# Patient Record
Sex: Male | Born: 1950 | Race: White | Hispanic: No | Marital: Single | Smoking: Never smoker
Health system: Southern US, Community
[De-identification: ages and names within clinical notes are randomized; demographics above are authoritative.]

## PROBLEM LIST (undated history)

## (undated) DIAGNOSIS — I2510 Atherosclerotic heart disease of native coronary artery without angina pectoris: Secondary | ICD-10-CM

## (undated) DIAGNOSIS — I4891 Unspecified atrial fibrillation: Secondary | ICD-10-CM

## (undated) DIAGNOSIS — D693 Immune thrombocytopenic purpura: Secondary | ICD-10-CM

## (undated) DIAGNOSIS — F32A Depression, unspecified: Secondary | ICD-10-CM

## (undated) DIAGNOSIS — N186 End stage renal disease: Secondary | ICD-10-CM

## (undated) DIAGNOSIS — I10 Essential (primary) hypertension: Secondary | ICD-10-CM

## (undated) DIAGNOSIS — E785 Hyperlipidemia, unspecified: Secondary | ICD-10-CM

## (undated) HISTORY — PX: LIVER TRANSPLANT: SHX410

## (undated) HISTORY — PX: GALLBLADDER SURGERY: SHX652

## (undated) HISTORY — DX: Unspecified atrial fibrillation: I48.91

## (undated) HISTORY — DX: Immune thrombocytopenic purpura: D69.3

## (undated) HISTORY — DX: Essential (primary) hypertension: I10

## (undated) HISTORY — DX: End stage renal disease: N18.6

## (undated) HISTORY — DX: Hyperlipidemia, unspecified: E78.5

## (undated) HISTORY — PX: APPENDECTOMY: SHX54

## (undated) HISTORY — PX: KIDNEY TRANSPLANT: SHX239

## (undated) HISTORY — PX: FACIAL COSMETIC SURGERY: SHX629

## (undated) HISTORY — PX: CORONARY ARTERY BYPASS GRAFT: SHX141

## (undated) HISTORY — PX: HERNIA REPAIR: SHX51

## (undated) HISTORY — DX: Atherosclerotic heart disease of native coronary artery without angina pectoris: I25.10

---

## 1998-05-15 ENCOUNTER — Other Ambulatory Visit: Admission: RE | Admit: 1998-05-15 | Discharge: 1998-05-15 | Payer: Self-pay | Admitting: Nephrology

## 1998-10-16 DIAGNOSIS — D693 Immune thrombocytopenic purpura: Secondary | ICD-10-CM | POA: Insufficient documentation

## 2000-01-20 ENCOUNTER — Encounter: Payer: Self-pay | Admitting: Nephrology

## 2000-01-20 ENCOUNTER — Inpatient Hospital Stay (HOSPITAL_COMMUNITY): Admission: AD | Admit: 2000-01-20 | Discharge: 2000-01-26 | Payer: Self-pay | Admitting: Nephrology

## 2000-04-19 ENCOUNTER — Encounter (INDEPENDENT_AMBULATORY_CARE_PROVIDER_SITE_OTHER): Payer: Self-pay | Admitting: *Deleted

## 2000-04-19 ENCOUNTER — Ambulatory Visit (HOSPITAL_COMMUNITY): Admission: RE | Admit: 2000-04-19 | Discharge: 2000-04-19 | Payer: Self-pay | Admitting: *Deleted

## 2003-06-14 ENCOUNTER — Ambulatory Visit (HOSPITAL_COMMUNITY): Admission: RE | Admit: 2003-06-14 | Discharge: 2003-06-14 | Payer: Self-pay | Admitting: Gastroenterology

## 2003-06-14 ENCOUNTER — Encounter: Payer: Self-pay | Admitting: Gastroenterology

## 2006-02-03 ENCOUNTER — Ambulatory Visit (HOSPITAL_COMMUNITY): Admission: RE | Admit: 2006-02-03 | Discharge: 2006-02-03 | Payer: Self-pay | Admitting: Vascular Surgery

## 2006-10-23 ENCOUNTER — Inpatient Hospital Stay (HOSPITAL_COMMUNITY): Admission: EM | Admit: 2006-10-23 | Discharge: 2006-10-28 | Payer: Self-pay | Admitting: Emergency Medicine

## 2006-10-25 ENCOUNTER — Encounter (INDEPENDENT_AMBULATORY_CARE_PROVIDER_SITE_OTHER): Payer: Self-pay | Admitting: *Deleted

## 2006-10-25 ENCOUNTER — Encounter: Payer: Self-pay | Admitting: Vascular Surgery

## 2006-11-16 ENCOUNTER — Ambulatory Visit (HOSPITAL_COMMUNITY): Admission: RE | Admit: 2006-11-16 | Discharge: 2006-11-16 | Payer: Self-pay | Admitting: Vascular Surgery

## 2006-11-20 ENCOUNTER — Inpatient Hospital Stay (HOSPITAL_COMMUNITY): Admission: EM | Admit: 2006-11-20 | Discharge: 2006-11-24 | Payer: Self-pay | Admitting: Emergency Medicine

## 2006-12-06 ENCOUNTER — Inpatient Hospital Stay (HOSPITAL_COMMUNITY): Admission: AD | Admit: 2006-12-06 | Discharge: 2006-12-17 | Payer: Self-pay | Admitting: Nephrology

## 2006-12-13 ENCOUNTER — Ambulatory Visit: Payer: Self-pay | Admitting: Vascular Surgery

## 2007-02-25 ENCOUNTER — Inpatient Hospital Stay (HOSPITAL_COMMUNITY): Admission: EM | Admit: 2007-02-25 | Discharge: 2007-02-27 | Payer: Self-pay | Admitting: Nephrology

## 2007-07-14 IMAGING — CR DG CHEST 2V
2 series · 2 of 2 positions shown · non-contrast
Comparison: 10/23/06.

CLINICAL DATA: Fever and nausea and vomiting. 
 CHEST - 2 VIEW:

[view not recorded (1 of 2)]
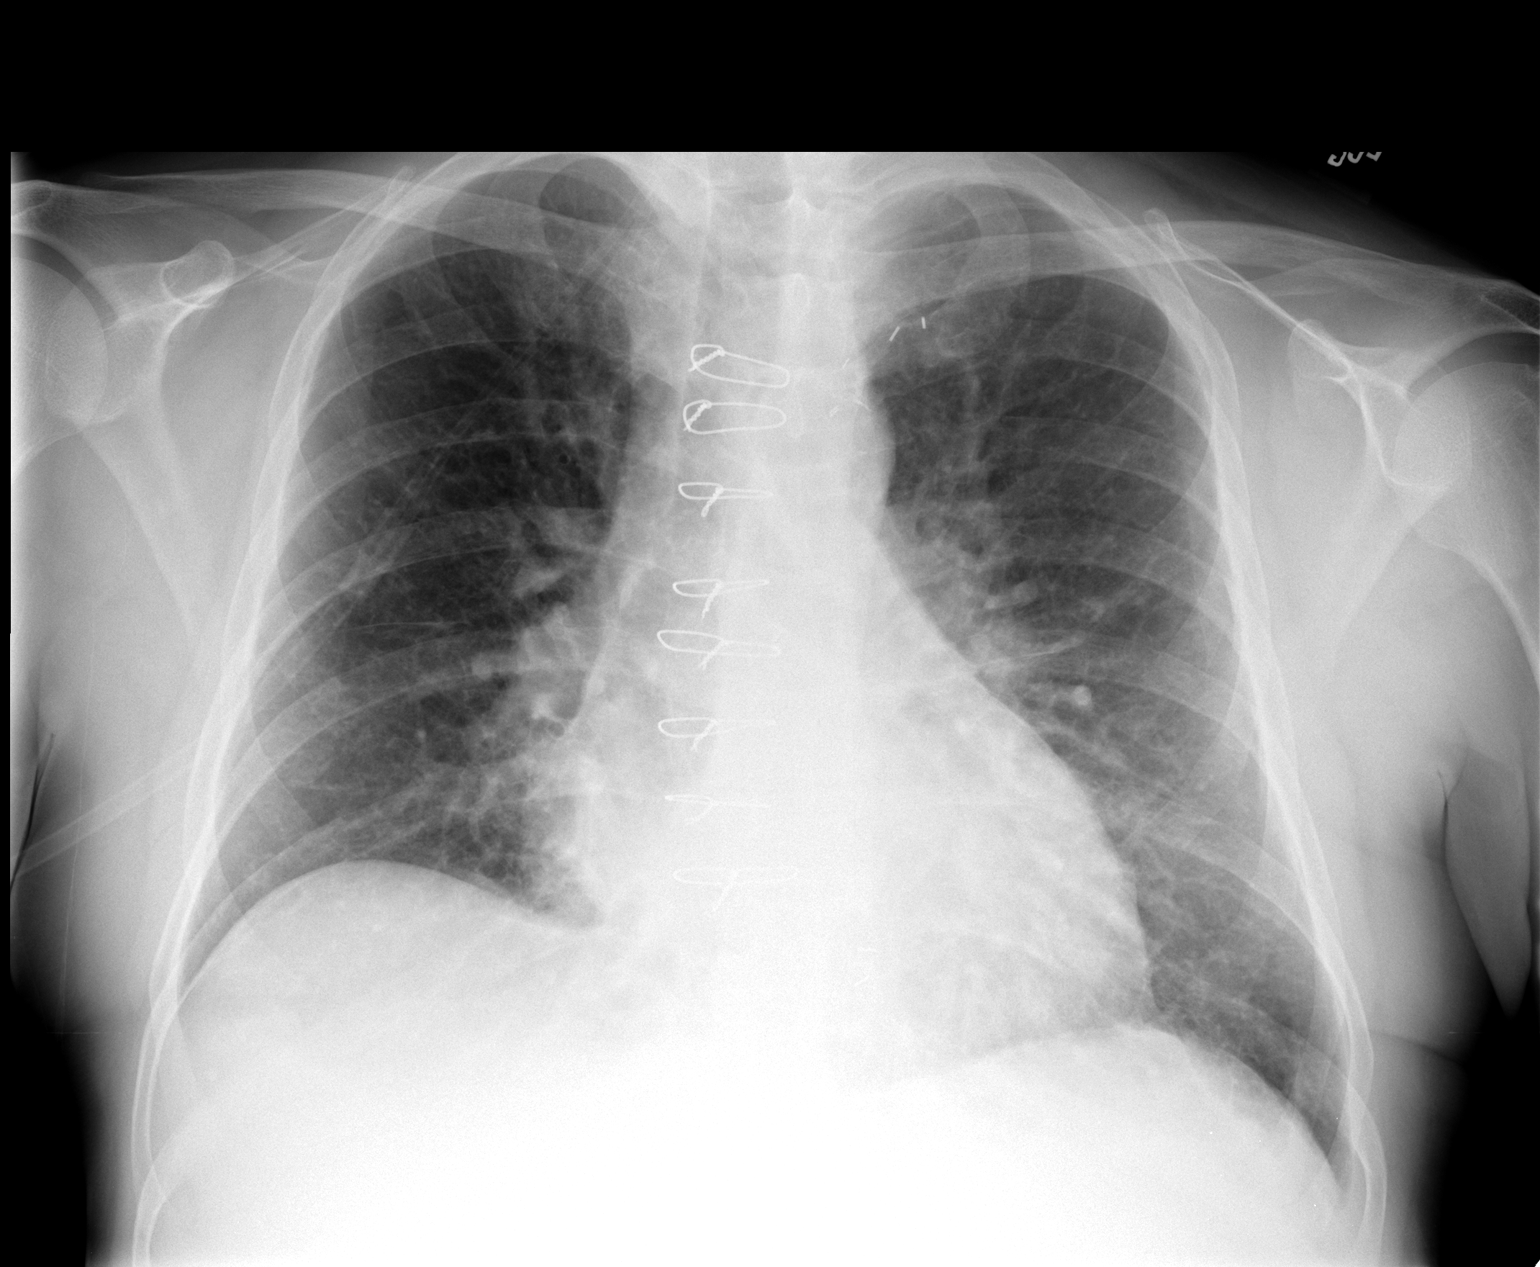

[view not recorded (2 of 2)]
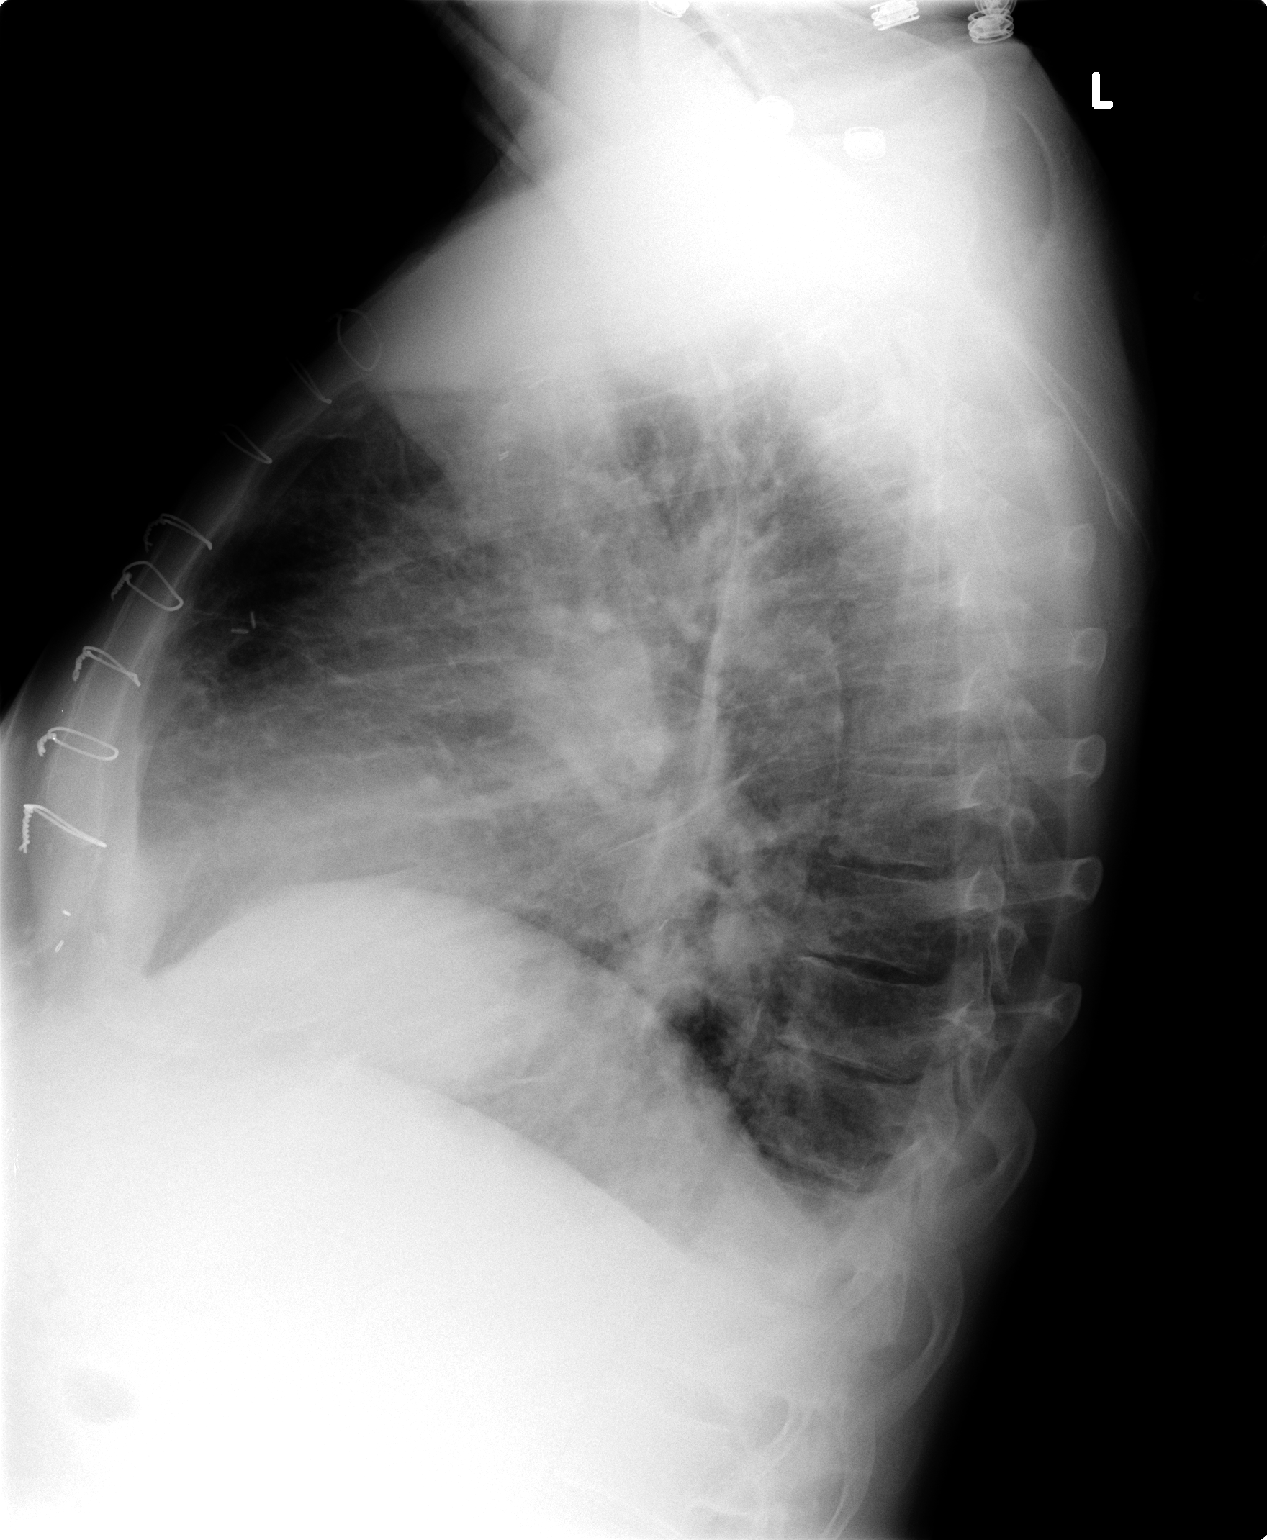

[2 of 2 positions shown; findings below may reference images not displayed]

FINDINGS: Patient is status post median sternotomy and coronary artery bypass grafting.  Heart size is minimally enlarged.  There is no effusions or edema.  Pulmonary vascular crowding is noted with low lung volumes.  There is no evidence for pneumonia. 
 Interstitial changes consistent with COPD/emphysema.
IMPRESSION: 1.  No active cardiopulmonary disease. 
 2.  COPD/emphysema.

## 2009-08-08 DIAGNOSIS — I4891 Unspecified atrial fibrillation: Secondary | ICD-10-CM

## 2009-08-08 HISTORY — DX: Unspecified atrial fibrillation: I48.91

## 2009-09-18 ENCOUNTER — Ambulatory Visit: Payer: Self-pay | Admitting: Cardiovascular Disease

## 2009-10-16 ENCOUNTER — Ambulatory Visit: Payer: Self-pay | Admitting: Cardiovascular Disease

## 2009-10-24 ENCOUNTER — Ambulatory Visit: Payer: Self-pay | Admitting: Cardiology

## 2009-10-24 ENCOUNTER — Ambulatory Visit (HOSPITAL_COMMUNITY): Admission: RE | Admit: 2009-10-24 | Discharge: 2009-10-24 | Payer: Self-pay | Admitting: Cardiology

## 2009-11-25 ENCOUNTER — Ambulatory Visit: Payer: Self-pay | Admitting: Cardiovascular Disease

## 2009-12-11 ENCOUNTER — Ambulatory Visit: Payer: Self-pay | Admitting: Cardiovascular Disease

## 2009-12-26 ENCOUNTER — Telehealth: Payer: Self-pay | Admitting: Cardiovascular Disease

## 2010-01-27 ENCOUNTER — Ambulatory Visit: Payer: Self-pay | Admitting: Cardiovascular Disease

## 2010-03-11 ENCOUNTER — Ambulatory Visit: Payer: Self-pay | Admitting: Cardiovascular Disease

## 2010-04-07 ENCOUNTER — Telehealth: Payer: Self-pay | Admitting: Cardiovascular Disease

## 2010-07-14 ENCOUNTER — Ambulatory Visit: Payer: Self-pay | Admitting: Cardiovascular Disease

## 2010-07-17 ENCOUNTER — Telehealth: Payer: Self-pay | Admitting: Cardiovascular Disease

## 2010-07-17 ENCOUNTER — Telehealth (INDEPENDENT_AMBULATORY_CARE_PROVIDER_SITE_OTHER): Payer: Self-pay | Admitting: Pharmacist

## 2010-07-17 DIAGNOSIS — D472 Monoclonal gammopathy: Secondary | ICD-10-CM | POA: Insufficient documentation

## 2010-08-12 ENCOUNTER — Ambulatory Visit: Payer: Self-pay | Admitting: Cardiovascular Disease

## 2010-08-12 LAB — CONVERTED CEMR LAB: POC INR: 2.4

## 2010-09-02 ENCOUNTER — Ambulatory Visit: Payer: Self-pay | Admitting: Internal Medicine

## 2010-09-09 ENCOUNTER — Ambulatory Visit: Payer: Self-pay | Admitting: Cardiology

## 2010-09-09 LAB — CONVERTED CEMR LAB: POC INR: 1.6

## 2010-09-16 ENCOUNTER — Ambulatory Visit: Payer: Self-pay | Admitting: Cardiovascular Disease

## 2010-09-16 LAB — CONVERTED CEMR LAB: POC INR: 2.2

## 2010-10-07 ENCOUNTER — Ambulatory Visit: Payer: Self-pay | Admitting: Internal Medicine

## 2010-10-07 LAB — CONVERTED CEMR LAB: POC INR: 1.5

## 2010-10-21 ENCOUNTER — Ambulatory Visit: Payer: Self-pay | Admitting: Cardiology

## 2010-10-21 LAB — CONVERTED CEMR LAB: POC INR: 1.9

## 2010-11-11 ENCOUNTER — Ambulatory Visit: Admission: RE | Admit: 2010-11-11 | Discharge: 2010-11-11 | Payer: Self-pay | Source: Home / Self Care

## 2010-11-11 LAB — CONVERTED CEMR LAB: POC INR: 1.8

## 2010-11-16 ENCOUNTER — Telehealth: Payer: Self-pay | Admitting: Cardiovascular Disease

## 2010-11-24 ENCOUNTER — Ambulatory Visit
Admission: RE | Admit: 2010-11-24 | Discharge: 2010-11-24 | Payer: Self-pay | Source: Home / Self Care | Attending: Cardiovascular Disease | Admitting: Cardiovascular Disease

## 2010-11-25 ENCOUNTER — Ambulatory Visit: Admission: RE | Admit: 2010-11-25 | Discharge: 2010-11-25 | Payer: Self-pay | Source: Home / Self Care

## 2010-12-10 NOTE — Medication Information (Signed)
Summary: rov/tm  Anticoagulant Therapy  Managed by: Tula Nakayama, RN, BSN Referring MD: Fletcher Anon MD, Saintclair Halsted MD: Lovena Le MD, Carleene Overlie Indication 1: Atrial Fibrillation Lab Used: [facility] [place] Site: [place] INR POC 1.4 INR RANGE 2.0-3.0  Dietary changes: no    Health status changes: yes       Details: Went to ER, had abdominal pain and running a fever  Bleeding/hemorrhagic complications: no    Recent/future hospitalizations: no    Any changes in medication regimen? no    Recent/future dental: no  Any missed doses?: yes     Details: Missed last Friday and Saturday's doses  Is patient compliant with meds? yes       Anticoagulation Management History:      The patient is taking warfarin and comes in today for a routine follow up visit.  Negative risk factors for bleeding include an age less than 55 years old.  The bleeding index is 'low risk'.  Negative CHADS2 values include Age > 50 years old.  Anticoagulation responsible [name]: Lovena Le MD, Carleene Overlie.  INR POC: 1.4.  Cuvette Lot#: [id number].  Exp: 09/2011.    Anticoagulation Management Assessment/Plan:      The target INR is 2.0-3.0.  The next INR is due 09/09/2010.  Anticoagulation instructions were given to patient.  Results were reviewed/authorized by Tula Nakayama, RN, BSN.  He was notified by [name] candidate.         Prior Anticoagulation Instructions: INR 2.4 Change dose to 5mg s everyday except 2.5mg s on Mondays, Wednesdays and Fridays. Recheck in 3 weeks.   Current Anticoagulation Instructions: INR 1.4  Take an extra tablet today and tomorrow, then continue taking same dose of 1 tablet everyday except 1/2 tablet on Monday, Wednesday, and Friday. Recheck in 7-10 days.   Appended Document: Coumadin Clinic    Anticoagulant Therapy  Managed by: Tula Nakayama, RN, BSN Referring MD: Fletcher Anon MD, Saintclair Halsted MD: Lovena Le MD, Carleene Overlie Indication 1: Atrial Fibrillation Lab  Used: [facility] [place] Site: [place] INR POC 1.4 INR RANGE 2.0-3.0           Anticoagulation Management History:      The patient is taking warfarin and comes in today for a routine follow up visit.  Negative risk factors for bleeding include an age less than 22 years old.  The bleeding index is 'low risk'.  Negative CHADS2 values include Age > 17 years old.  Anticoagulation responsible [name]: Lovena Le MD, Carleene Overlie.  INR POC: 1.4.  Cuvette Lot#: [id number].  Exp: 09/2011.    Anticoagulation Management Assessment/Plan:      The target INR is 2.0-3.0.  The next INR is due 09/09/2010.  Anticoagulation instructions were given to patient.  Results were reviewed/authorized by Tula Nakayama, RN, BSN.  He was notified by Tula Nakayama, RN, BSN.         Prior Anticoagulation Instructions: INR 1.4  Take an extra tablet today and tomorrow, then continue taking same dose of 1 tablet everyday except 1/2 tablet on Monday, Wednesday, and Friday. Recheck in 7-10 days.   Current Anticoagulation Instructions: INR 1.4  Today take 5mg s and tomorrow take 7.5mg s then resume 5mg s daily excpet 2.5mg s on MWF. Recheck in 7-10 days.

## 2010-12-10 NOTE — Procedures (Signed)
Summary: summary report  summary report   Imported By: Parks Neptune 12/16/2009 15:43:36  _____________________________________________________________________  External Attachment:    Type:   Image     Comment:   External Document

## 2010-12-10 NOTE — Medication Information (Signed)
Summary: [name]  Anticoagulant Therapy  Managed by: Tula Nakayama, RN, BSN Referring MD: Fletcher Anon MD, Saintclair Halsted MD: Percival Spanish MD, Jeneen Rinks Indication 1: Atrial Fibrillation Lab [facility] [place] Site: [place] INR POC 1.8 INR RANGE 2.0-3.0  Dietary changes: no    Health status changes: no    Bleeding/hemorrhagic complications: no    Recent/future hospitalizations: yes       Details: Last week in hosp. for Fever of unorigin and low platelet  Any changes in medication regimen? yes       Details: ASA discontinued   Recent/future dental: no  Any missed doses?: no       Is patient compliant with meds? yes       Anticoagulation Management History:      The patient is taking warfarin and comes in today for a routine follow up visit.  Negative risk factors for bleeding include an age less than 47 years old.  The bleeding index is 'low risk'.  Negative CHADS2 values include Age > 41 years old.  Anticoagulation responsible provider: Percival Spanish MD, Jeneen Rinks.  INR POC: 1.8.  Cuvette Lot#: [id number].  Exp: 12/2011.    Anticoagulation Management Assessment/Plan:      The target INR is 2.0-3.0.  The next INR is due 11/25/2010.  Anticoagulation instructions were given to patient.  Results were reviewed/authorized by Tula Nakayama, RN, BSN.  He was notified by Tula Nakayama, RN, BSN.         Prior Anticoagulation Instructions: INR 1.9   Take 1 tablet today. Then take 1 tablet everday except 1/2 tablet on Mondays and Fridays.  Recheck INR in 3 weeks   Current Anticoagulation Instructions: INR 1.8 Today take 1.5 pills then change dose to 1 pill everyday except 1/2 pill on Mondays. Recheck in 2 weeks.

## 2010-12-10 NOTE — Medication Information (Signed)
Summary: rov/kh  Anticoagulant Therapy  Managed by: Clement Husbands.D Referring MD: Fletcher Anon MD, Saintclair Halsted MD: Aundra Dubin MD, Dalton Indication 1: Atrial Fibrillation Lab [facility] [place] Site: [place] INR POC 1.9 INR RANGE 2.0-3.0  Dietary changes: no    Health status changes: no    Bleeding/hemorrhagic complications: no    Recent/future hospitalizations: no    Any changes in medication regimen? no    Recent/future dental: no  Any missed doses?: no       Is patient compliant with meds? yes       Anticoagulation Management History:      The patient is taking warfarin and comes in today for a routine follow up visit.  Negative risk factors for bleeding include an age less than 77 years old.  The bleeding index is 'low risk'.  Negative CHADS2 values include Age > 32 years old.  Anticoagulation responsible Liliah Dorian: Aundra Dubin MD, Dalton.  INR POC: 1.9.  Cuvette Lot#: [id number].  Exp: 09/2011.    Anticoagulation Management Assessment/Plan:      The target INR is 2.0-3.0.  The next INR is due 11/11/2010.  Anticoagulation instructions were given to patient.  Results were reviewed/authorized by Hazard, Jacquelyne Balint.D.         Prior Anticoagulation Instructions: INR 1.5 Take 1.5 tablet today then take 1 tablet everyday except 0.5 tablet on Monday and Friday Recheck INR in   Current Anticoagulation Instructions: INR 1.9   Take 1 tablet today. Then take 1 tablet everday except 1/2 tablet on Mondays and Fridays.  Recheck INR in 3 weeks

## 2010-12-10 NOTE — Progress Notes (Signed)
  Phone Note Outgoing Call   Call placed by: Rhett Bannister LPN Call placed to: Patient Summary of Call: [facility] notifying patient per Dr. Zenia Resides that heartrate on Holter was OK.  If any questions, advised to call office.

## 2010-12-10 NOTE — Progress Notes (Signed)
  Phone Note Outgoing Call   Call placed by: Rhett Bannister  LPN,  September  9, [id number] 4:21 PM Call placed to: coumadin clinic Summary of Call: [facility]. Coumadin clinic at [facility]. in [place] wants to start having INR checks there per Dr. Harrell Lark.  They will call patient at home to see when he is due a check and then set him up with an appt. there.

## 2010-12-10 NOTE — Medication Information (Signed)
Summary: rov/sl  Anticoagulant Therapy  Managed by: Porfirio Oar, PharmD Referring MD: Fletcher Anon MD, Saintclair Halsted MD: Haroldine Laws MD, Quillian Quince Indication 1: Atrial Fibrillation Lab Used: [facility] [place] Site: [place] INR POC 1.5 INR RANGE 2.0-3.0  Dietary changes: no    Health status changes: no    Bleeding/hemorrhagic complications: no    Recent/future hospitalizations: no    Any changes in medication regimen? no    Recent/future dental: no  Any missed doses?: no       Is patient compliant with meds? yes       Anticoagulation Management History:      The patient is taking warfarin and comes in today for a routine follow up visit.  Negative risk factors for bleeding include an age less than 46 years old.  The bleeding index is 'low risk'.  Negative CHADS2 values include Age > 67 years old.  Anticoagulation responsible provider: Bensimhon MD, Quillian Quince.  INR POC: 1.5.  Exp: 09/2011.    Anticoagulation Management Assessment/Plan:      The target INR is 2.0-3.0.  The next INR is due 10/21/2010.  Anticoagulation instructions were given to patient.  Results were reviewed/authorized by Porfirio Oar, PharmD.  He was notified by Carmin Richmond, PharmD Candidate.         Prior Anticoagulation Instructions: INR 2.2  Take Coumadin 1 tab (5 mg) on Sun, Wed, Thur and Coumadin 0.5 tab (2.5 mg) on Mon, Tues, Fri, Sat. Return to clinic in 3 weeks.   Current Anticoagulation Instructions: INR 1.5 Take 1.5 tablet today then take 1 tablet everyday except 0.5 tablet on Monday and Friday Recheck INR in

## 2010-12-10 NOTE — Medication Information (Signed)
Summary: rov/nb  Anticoagulant Therapy  Managed by: Gypsy Lore, PharmD Referring MD: Fletcher Anon MD, Saintclair Halsted MD: Johnsie Cancel MD, Collier Salina Indication 1: Atrial Fibrillation Lab Used: [facility] [place] Site: [place] INR POC 2.2 INR RANGE 2.0-3.0  Dietary changes: no    Health status changes: no    Bleeding/hemorrhagic complications: no    Recent/future hospitalizations: no    Any changes in medication regimen? no    Recent/future dental: no  Any missed doses?: no       Is patient compliant with meds? yes      Comments: Pt reports that he has been taking Coumadin 2.5 mg (0.5 tab) on Fri, Sat, Sun, Mon which is inconsistent with our instructions since he has been seen in our clinic. I have changed his dosing to reflect what he actually reports doing.   Anticoagulation Management History:      The patient is taking warfarin and comes in today for a routine follow up visit.  Negative risk factors for bleeding include an age less than 19 years old.  The bleeding index is 'low risk'.  Negative CHADS2 values include Age > 55 years old.  Anticoagulation responsible provider: Johnsie Cancel MD, Collier Salina.  INR POC: 2.2.  Cuvette Lot#: [id number].  Exp: 09/2011.    Anticoagulation Management Assessment/Plan:      The target INR is 2.0-3.0.  The next INR is due 10/07/2010.  Anticoagulation instructions were given to patient.  Results were reviewed/authorized by Gypsy Lore, PharmD.  He was notified by Gypsy Lore PharmD.         Prior Anticoagulation Instructions: INR 1.6  Take 1 whole tablet today then take 1 tablet everyday except 1/2 tablet on Monday and Friday.  Recheck INR in 1week.  Current Anticoagulation Instructions: INR 2.2  Take Coumadin 1 tab (5 mg) on Sun, Wed, Thur and Coumadin 0.5 tab (2.5 mg) on Mon, Tues, Fri, Sat. Return to clinic in 3 weeks.

## 2010-12-10 NOTE — Medication Information (Signed)
Summary: new patient/eac  Anticoagulant Therapy  Managed by: Tula Nakayama, RN, BSN Referring MD: Fletcher Anon MD, Saintclair Halsted MD: Angelena Form MD, Harrell Gave Indication 1: Atrial Fibrillation Lab [facility] [place] Site: [place] INR POC 2.4 INR RANGE 2.0-3.0  Dietary changes: no    Health status changes: no    Bleeding/hemorrhagic complications: no    Recent/future hospitalizations: no    Any changes in medication regimen? no    Recent/future dental: no  Any missed doses?: no       Is patient compliant with meds? yes      Comments: Pt has been altering 5mg s with 2.5mg s he states for the past year. He states INR's are usually in range(2.0-3.0) He states he was placed on coumadin by [facility]. a year ago.  New pt education reviewed, bleeding, dietary, OTC's, pending procedure protocol.   Anticoagulation Management History:      The patient comes in today for his initial visit for anticoagulation therapy.  Negative risk factors for bleeding include an age less than 58 years old.  The bleeding index is 'low risk'.  Negative CHADS2 values include Age > 58 years old.  Anticoagulation responsible provider: Angelena Form MD, Harrell Gave.  INR POC: 2.4.  Cuvette Lot#: [id number].  Exp: 09/2011.    Anticoagulation Management Assessment/Plan:      The target INR is 2.0-3.0.  The next INR is due 09/02/2010.  Anticoagulation instructions were given to patient.  Results were reviewed/authorized by Tula Nakayama, RN, BSN.  He was notified by Tula Nakayama, RN, BSN.         Current Anticoagulation Instructions: INR 2.4 Change dose to 5mg s everyday except 2.5mg s on Mondays, Wednesdays and Fridays. Recheck in 3 weeks.

## 2010-12-10 NOTE — Progress Notes (Signed)
  Phone Note Outgoing Call   Call placed by: Rhett Bannister,  Apr 07, 2010 2:16 PM Call placed to: Patient Summary of Call: Patient notified per Dr. Zenia Resides, can monitor INRs temporarily but should use [address] or local PCP for long term.

## 2010-12-10 NOTE — Progress Notes (Signed)
  Phone Note From Other Clinic   Caller: [place] [facility] [place] Call For: Coumadin Clinic Summary of Call: Dr. Tyrell Antonio RN at [facility] office called to schedule an appt for a patient with the coumadin clinic.  [name] is currently on coumadin for atrial fib, and was being managed by his PCP, Dr. Harrell Lark, in [facility].  He feels it would be more convenient to come to the [place] office and would like the [place] to manage his coumadin.  I have contacted the patient to set up an appt.  He was just seen this past wednesday for INR check by Dr. Harrell Lark and was told to follow-up in 1 month.  I have scheduled him for 4 weeks from his most recent appt with Dr. Verdie Drown office.   Initial call taken by: Margaretha Sheffield, PharmD 07/17/10, 4:30 pm

## 2010-12-10 NOTE — Progress Notes (Signed)
  Phone Note Outgoing Call   Call placed by: Rhett Bannister  LPN,  January  9, [id number] 3:10 PM Call placed to: Patient Summary of Call: LMVM-patient to call office and schedulle appt. for f/u sp Inpatient.

## 2010-12-10 NOTE — Procedures (Signed)
Summary: SUMMARY REPORT  SUMMARY REPORT   Imported By: Parks Neptune 12/29/2009 14:43:17  _____________________________________________________________________  External Attachment:    Type:   Image     Comment:   External Document

## 2010-12-10 NOTE — Progress Notes (Signed)
Summary: ORDER FOR PT  Phone Note Call from Patient   Summary of Call: PT CALLED AND STATED DR SPRY WANTED HIM TO HAVE HIS PROTIME CHECKED [place].  HE IS WANTING TO GO TO [facility] [address] [facility].  WILL DR. Fletcher Anon SET THESE UP FOR HIM?   [phone] Initial call taken by: Luana Shu,  July 17, 2010 3:31 PM

## 2010-12-10 NOTE — Medication Information (Signed)
Summary: rov/tm  Anticoagulant Therapy  Managed by: Tula Nakayama, RN, BSN Referring MD: Fletcher Anon MD, Saintclair Halsted MD: [name] MD, Remo Lipps Indication 1: Atrial Fibrillation Lab [facility] [place] Site: [place] INR POC 2.4 INR RANGE 2.0-3.0  Dietary changes: no    Health status changes: no    Bleeding/hemorrhagic complications: no    Recent/future hospitalizations: yes       Details: Abnormal fevers  Any changes in medication regimen? no    Recent/future dental: no  Any missed doses?: no       Is patient compliant with meds? yes       Anticoagulation Management History:      The patient is taking warfarin and comes in today for a routine follow up visit.  Negative risk factors for bleeding include an age less than 54 years old.  The bleeding index is 'low risk'.  Negative CHADS2 values include Age > 53 years old.  Anticoagulation responsible provider: [name] MD, Remo Lipps.  INR POC: 2.4.  Cuvette Lot#: [id number].  Exp: 12/2011.    Anticoagulation Management Assessment/Plan:      The target INR is 2.0-3.0.  The next INR is due 12/24/2010.  Anticoagulation instructions were given to patient.  Results were reviewed/authorized by Tula Nakayama, RN, BSN.  He was notified by Ernst Bowler, PharmD candidate.         Prior Anticoagulation Instructions: INR 1.8 Today take 1.5 pills then change dose to 1 pill everyday except 1/2 pill on Mondays. Recheck in 2 weeks.   Current Anticoagulation Instructions: INR 2.4 (INR goal: 2-3)  Take 1 tablet everyday except 1/2 tablet on Mondays.

## 2010-12-10 NOTE — Medication Information (Signed)
Summary: rov/sel  Anticoagulant Therapy  Managed by: Porfirio Oar, PharmD Referring MD: Fletcher Anon MD, Saintclair Halsted MD: Lia Foyer MD, Marcello Moores Indication 1: Atrial Fibrillation Lab Used: [facility] [place] Site: [place] INR POC 1.6 INR RANGE 2.0-3.0  Dietary changes: no    Health status changes: no    Bleeding/hemorrhagic complications: no    Recent/future hospitalizations: no    Any changes in medication regimen? no    Recent/future dental: no  Any missed doses?: yes     Details: Missed 1 dose on Sunday night.  Is patient compliant with meds? yes       Anticoagulation Management History:      The patient is taking warfarin and comes in today for a routine follow up visit.  Negative risk factors for bleeding include an age less than 34 years old.  The bleeding index is 'low risk'.  Negative CHADS2 values include Age > 25 years old.  Anticoagulation responsible provider: Lia Foyer MD, Marcello Moores.  INR POC: 1.6.  Cuvette Lot#: [id number].  Exp: 09/2011.    Anticoagulation Management Assessment/Plan:      The target INR is 2.0-3.0.  The next INR is due 09/16/2010.  Anticoagulation instructions were given to patient.  Results were reviewed/authorized by Porfirio Oar, PharmD.  He was notified by Carmin Richmond, PharmD Candidate.         Prior Anticoagulation Instructions: INR 1.4  Today take 5mg s and tomorrow take 7.5mg s then resume 5mg s daily excpet 2.5mg s on MWF. Recheck in 7-10 days.   Current Anticoagulation Instructions: INR 1.6  Take 1 whole tablet today then take 1 tablet everyday except 1/2 tablet on Monday and Friday.  Recheck INR in 1week.

## 2010-12-14 ENCOUNTER — Encounter: Payer: Self-pay | Admitting: Internal Medicine

## 2010-12-23 ENCOUNTER — Telehealth: Payer: Self-pay | Admitting: Cardiovascular Disease

## 2010-12-30 ENCOUNTER — Encounter (INDEPENDENT_AMBULATORY_CARE_PROVIDER_SITE_OTHER): Payer: Self-pay | Admitting: Pharmacist

## 2010-12-31 ENCOUNTER — Encounter: Payer: Self-pay | Admitting: Internal Medicine

## 2011-01-05 NOTE — Medication Information (Addendum)
Summary: Coumadin Clinic  Anticoagulant Therapy  Managed by: Tula Nakayama, RN, BSN Referring MD: Fletcher Anon MD, Saintclair Halsted MD: Lovena Le MD, Carleene Overlie Indication 1: Atrial Fibrillation Lab Used: [facility] [place] Site: [place] INR POC 1.3 INR RANGE 2.0-3.0  Dietary changes: no    Health status changes: no    Bleeding/hemorrhagic complications: no    Recent/future hospitalizations: no    Any changes in medication regimen? no    Recent/future dental: no  Any missed doses?: no       Is patient compliant with meds? yes      Comments: Pt states he was in hospital on  2/5 at [place] for 1/2  day then transfered to [facility] discharged from [facility] on 2/14. INR on 2/5 was 5.0.  He was discharged home taking 2.5mg s daily. INR on 2/14 was 3.0  Anticoagulation Management History:      His anticoagulation is being managed by telephone today.  Negative risk factors for bleeding include an age less than 57 years old.  The bleeding index is 'low risk'.  Negative CHADS2 values include Age > 34 years old.  Anticoagulation responsible provider: Lovena Le MD, Carleene Overlie.  INR POC: 1.3.    Anticoagulation Management Assessment/Plan:      The target INR is 2.0-3.0.  The next INR is due 01/07/2011.  Anticoagulation instructions were given to patient.  Results were reviewed/authorized by Tula Nakayama, RN, BSN.  He was notified by Tula Nakayama, RN, BSN.         Prior Anticoagulation Instructions: INR 2.4 (INR goal: 2-3)  Take 1 tablet everyday except 1/2 tablet on Mondays.    Current Anticoagulation Instructions: INR 1.3 Change dose to 1 pill everyday except 1/2 pill on Mondays, Wednesdays and Fridays. Recheck in one week.

## 2011-01-05 NOTE — Progress Notes (Signed)
Summary: [place] FOR PT  Phone Note Call from Patient   Summary of Call: PT CALLED AND WANTS DR. Fletcher Anon TO SET HIM UP [place] FOR HIS PT'S CKS SO HE WILL NOT HAVE TO GO TO [place].  [place] TALKED TO HIM ABOUT THIS AT HIS LAST VISIT. Initial call taken by: Luana Shu,  December 23, 2010 3:57 PM    Called patient back. He will get more information about how to order and more details of how it works.

## 2011-01-06 ENCOUNTER — Encounter: Payer: Self-pay | Admitting: Cardiology

## 2011-01-06 DIAGNOSIS — I4891 Unspecified atrial fibrillation: Secondary | ICD-10-CM

## 2011-01-07 ENCOUNTER — Encounter (INDEPENDENT_AMBULATORY_CARE_PROVIDER_SITE_OTHER): Payer: Medicare Other

## 2011-01-07 ENCOUNTER — Encounter: Payer: Self-pay | Admitting: Internal Medicine

## 2011-01-07 DIAGNOSIS — I4891 Unspecified atrial fibrillation: Secondary | ICD-10-CM

## 2011-01-07 DIAGNOSIS — Z7901 Long term (current) use of anticoagulants: Secondary | ICD-10-CM

## 2011-01-14 ENCOUNTER — Encounter: Payer: Self-pay | Admitting: Internal Medicine

## 2011-01-14 ENCOUNTER — Encounter (INDEPENDENT_AMBULATORY_CARE_PROVIDER_SITE_OTHER): Payer: Medicare Other

## 2011-01-14 DIAGNOSIS — I4891 Unspecified atrial fibrillation: Secondary | ICD-10-CM

## 2011-01-14 DIAGNOSIS — Z7901 Long term (current) use of anticoagulants: Secondary | ICD-10-CM

## 2011-01-14 NOTE — Medication Information (Signed)
Summary: rov/tm  Anticoagulant Therapy  Managed by: Freddrick March, RN, BSN Referring MD: Fletcher Anon MD, Saintclair Halsted MD: Rayann Heman MD, Jeneen Rinks Indication 1: Atrial Fibrillation Lab [facility] [place] Site: [place] INR POC 1.3 INR RANGE 2.0-3.0  Dietary changes: no    Health status changes: no    Bleeding/hemorrhagic complications: no    Recent/future hospitalizations: no    Any changes in medication regimen? no    Recent/future dental: no  Any missed doses?: no       Is patient compliant with meds? yes       Anticoagulation Management History:      The patient is taking warfarin and comes in today for a routine follow up visit.  Negative risk factors for bleeding include an age less than 54 years old.  The bleeding index is 'low risk'.  Negative CHADS2 values include Age > 63 years old.  Anticoagulation responsible provider: Jas Betten MD, Jeneen Rinks.  INR POC: 1.3.  Cuvette Lot#: [id number].  Exp: 11/2011.    Anticoagulation Management Assessment/Plan:      The target INR is 2.0-3.0.  The next INR is due 01/14/2011.  Anticoagulation instructions were given to patient.  Results were reviewed/authorized by Freddrick March, RN, BSN.  He was notified by Freddrick March, RN, BSN.         Prior Anticoagulation Instructions: INR 1.3 Change dose to 1 pill everyday except 1/2 pill on Mondays, Wednesdays and Fridays. Recheck in one week.   Current Anticoagulation Instructions: INR 1.3  Start taking 1 tablet daily.  Recheck in 1 week.

## 2011-01-19 NOTE — Medication Information (Addendum)
Summary: rov/ewj  Anticoagulant Therapy  Managed by: Danella Penton, RN Referring MD: Fletcher Anon MD, Saintclair Halsted MD: Lovena Le MD, Carleene Overlie Indication 1: Atrial Fibrillation Lab Used: [facility] [place] Site: [place] INR POC 1.5 INR RANGE 2.0-3.0  Dietary changes: no    Health status changes: no    Bleeding/hemorrhagic complications: no    Recent/future hospitalizations: no    Any changes in medication regimen? no    Recent/future dental: no  Any missed doses?: no       Is patient compliant with meds? yes       Anticoagulation Management History:      The patient is taking warfarin and comes in today for a routine follow up visit.  Negative risk factors for bleeding include an age less than 88 years old.  The bleeding index is 'low risk'.  Negative CHADS2 values include Age > 64 years old.  Anticoagulation responsible provider: Lovena Le MD, Carleene Overlie.  INR POC: 1.5.  Cuvette Lot#: [id number].  Exp: 11/2011.    Anticoagulation Management Assessment/Plan:      The target INR is 2.0-3.0.  The next INR is due 01/25/2011.  Anticoagulation instructions were given to patient.  Results were reviewed/authorized by Danella Penton, RN.  He was notified by Danella Penton, RN.         Prior Anticoagulation Instructions: INR 1.3  Start taking 1 tablet daily.  Recheck in 1 week.    Current Anticoagulation Instructions: INR 1.5 Take 1 1/2 tablets on Thursdays and Fridays.  Then, begin taking 1 tablet every day, except take 1 1/2 on Mondays. Recheck in 10 days.

## 2011-01-19 NOTE — Letter (Signed)
Summary: [facility]: Discharge Summary  [facility]: Discharge Summary   Imported By: Roddie Mc 01/06/2011 09:53:07  _____________________________________________________________________  External Attachment:    Type:   Image     Comment:   External Document

## 2011-01-25 ENCOUNTER — Encounter (INDEPENDENT_AMBULATORY_CARE_PROVIDER_SITE_OTHER): Payer: Medicare Other

## 2011-01-25 ENCOUNTER — Encounter: Payer: Self-pay | Admitting: Internal Medicine

## 2011-01-25 DIAGNOSIS — I4891 Unspecified atrial fibrillation: Secondary | ICD-10-CM

## 2011-01-25 DIAGNOSIS — Z7901 Long term (current) use of anticoagulants: Secondary | ICD-10-CM

## 2011-01-25 LAB — CONVERTED CEMR LAB: POC INR: 2

## 2011-02-01 LAB — POCT INR: INR: 3.7

## 2011-02-03 ENCOUNTER — Ambulatory Visit (INDEPENDENT_AMBULATORY_CARE_PROVIDER_SITE_OTHER): Payer: Medicare Other | Admitting: Cardiology

## 2011-02-03 DIAGNOSIS — Z7901 Long term (current) use of anticoagulants: Secondary | ICD-10-CM

## 2011-02-03 DIAGNOSIS — I4891 Unspecified atrial fibrillation: Secondary | ICD-10-CM

## 2011-02-03 NOTE — Patient Instructions (Signed)
INR 3.7  Spoke with pt.  Skip today's dose then reduce dose to 1 tablet every day.  Recheck INR in 1 week.

## 2011-02-04 NOTE — Medication Information (Signed)
Summary: rov/pc  Anticoagulant Therapy  Managed by: Ella Jubilee, PharmD Referring MD: Fletcher Anon MD, Saintclair Halsted MD: Harrington Challenger MD, Nevin Bloodgood Indication 1: Atrial Fibrillation Lab Used: [facility] [place] Site: [place] INR POC 2 INR RANGE 2.0-3.0  Dietary changes: no    Health status changes: no    Bleeding/hemorrhagic complications: no    Recent/future hospitalizations: no    Any changes in medication regimen? no    Recent/future dental: no  Any missed doses?: no       Is patient compliant with meds? yes       Current Medications (verified): 1)  Cardizem Cd 120 Mg Xr24h-Cap (Diltiazem Hcl Coated Beads) .... Take One Capsule By Mouth Every Day 2)  Coumadin 5 Mg Tabs (Warfarin Sodium) .... As Directed  Allergies (verified): No Known Drug Allergies  Anticoagulation Management History:      Negative risk factors for bleeding include an age less than 2 years old.  The bleeding index is 'low risk'.  Negative CHADS2 values include Age > 75 years old.  Anticoagulation responsible provider: Harrington Challenger MD, Nevin Bloodgood.  INR POC: 2.  Exp: 11/2011.    Anticoagulation Management Assessment/Plan:      The patient's current anticoagulation dose is Coumadin 5 mg tabs: as directed.  The target INR is 2.0-3.0.  The next INR is due 01/25/2011.  Anticoagulation instructions were given to patient.  Results were reviewed/authorized by Ella Jubilee, PharmD.         Prior Anticoagulation Instructions: INR 1.5 Take 1 1/2 tablets on Thursdays and Fridays.  Then, begin taking 1 tablet every day, except take 1 1/2 on Mondays. Recheck in 10 days.  Current Anticoagulation Instructions: Cont with current regimen Return to clinic on 4/2 @ 3pm

## 2011-02-05 ENCOUNTER — Telehealth: Payer: Self-pay | Admitting: Cardiovascular Disease

## 2011-02-08 ENCOUNTER — Encounter: Payer: Medicare Other | Admitting: *Deleted

## 2011-02-08 LAB — PROTIME-INR
INR: 2.51 — ABNORMAL HIGH (ref 0.00–1.49)
Prothrombin Time: 26.9 seconds — ABNORMAL HIGH (ref 11.6–15.2)

## 2011-02-10 ENCOUNTER — Encounter: Payer: Self-pay | Admitting: Cardiovascular Disease

## 2011-02-11 ENCOUNTER — Telehealth: Payer: Self-pay | Admitting: *Deleted

## 2011-02-11 NOTE — Telephone Encounter (Signed)
LMVM-notified patient per Dr. Fletcher Anon, INR result was OK.  To call office if any questions.

## 2011-02-11 NOTE — Telephone Encounter (Signed)
The patient is on a trial of home INR monitoring.

## 2011-02-16 ENCOUNTER — Telehealth: Payer: Self-pay | Admitting: *Deleted

## 2011-02-16 NOTE — Telephone Encounter (Signed)
T. C. From patient-reported his INR at 3.5.  Currently taking 5 mg every evening.  Has not been eating green food.

## 2011-02-16 NOTE — Telephone Encounter (Signed)
LMVM-notified patient per Dr. Fletcher Anon, to decrease Warfarin to 4mg  every day.  Needs to call this nurse back to inform of Pharmacy to call in new dosage.

## 2011-02-18 ENCOUNTER — Other Ambulatory Visit: Payer: Self-pay | Admitting: *Deleted

## 2011-02-18 DIAGNOSIS — I4891 Unspecified atrial fibrillation: Secondary | ICD-10-CM

## 2011-02-18 MED ORDER — WARFARIN SODIUM 4 MG PO TABS: 4.0000 mg | ORAL_TABLET | Freq: Every day | ORAL | Status: DC

## 2011-03-02 ENCOUNTER — Telehealth: Payer: Self-pay | Admitting: *Deleted

## 2011-03-02 NOTE — Telephone Encounter (Signed)
Pt does self test for INR -- today result was 1.20 -- per Dr Fletcher Anon increase warfarin to 5mg  daily

## 2011-03-02 NOTE — Telephone Encounter (Signed)
Pt was in [facility] for upper abdomen pain the hospitalist stopped his coumadin and gave him 2 days of Vitamin K -- I told him to start taking 5mg  for 2 days and then 4mg  daily. When he checks it on Monday I asked him to call me in the [place] office so I could talk to Dr Fletcher Anon at that time to make sure we get him back on track.

## 2011-03-08 LAB — PROTIME-INR

## 2011-03-09 ENCOUNTER — Telehealth: Payer: Self-pay | Admitting: *Deleted

## 2011-03-09 NOTE — Telephone Encounter (Signed)
Spoke with Mr [name],  His INR was 1.9 per Dr Fletcher Anon told him to continue the 4mg  dose.

## 2011-03-11 ENCOUNTER — Encounter: Payer: Self-pay | Admitting: Cardiovascular Disease

## 2011-03-11 LAB — PROTIME-INR

## 2011-03-18 ENCOUNTER — Encounter: Payer: Self-pay | Admitting: Cardiovascular Disease

## 2011-03-23 NOTE — Assessment & Plan Note (Signed)
[facility] OFFICE NOTE   IRAN, WALKENHORST                   MRN:          [id number]  DATE:07/14/2010                            DOB:          May 09, 1951    Mr. Borts is a 60 year old gentleman with the following problem list:  1. Atrial fibrillation which was diagnosed in October 2010.  He is      status post cardioversion in December 2010.  However, he seems to      be in permanent atrial fibrillation at this time.  2. Coronary artery disease, status post CABG in 1982.  Most recent      cardiac catheterization was done in 2008 at [facility] which showed a      totally occluded LAD, LCX, and RCA.  The grafts were patent except      for SVG to OM1.  He had an SVG to OM3 and SVG to ramus which were      both patent.  LIMA to LAD was patent as well.  3. End-stage renal disease, status post renal transplant in 1997.  He      developed end-stage renal and liver disease in 2008 and underwent      renal and liver transplant again in 2008.  4. Idiopathic thrombocytopenia.  5. Hypertension.  6. Hyperlipidemia.  Not currently on any statins due to previous      history of cryptogenic liver failure.   INTERVAL HISTORY:  The patient is here today for a followup visit.  Overall, he has no complaints at this time.  He appears to be in  permanent atrial fibrillation, but denies any symptoms related to this.  He has no palpitations or significant dyspnea at this time.  He is  currently on metoprolol and Cardizem for rate control.  He is not on  digoxin anymore.  He continues to be on anticoagulation with warfarin  and a small dose aspirin 81 mg daily due to his history of coronary  artery disease.   PHYSICAL EXAMINATION:  VITAL SIGNS:  Weight is 202.40 0.41 pounds, blood  pressure is 130/68, pulse is 82, oxygen saturation is 98% on room air.  NECK:  No JVD or carotid bruits.  LUNGS:  Clear to auscultation.  HEART:  Irregularly irregular with no gallops or murmurs.  ABDOMEN:  Benign, nontender, nondistended.  EXTREMITIES:  With no clubbing, cyanosis, or edema.   ASSESSMENT AND PLAN:  1. Atrial fibrillation.  This seems to be permanent at this time.      Most recent echocardiogram was done in March 2011, which showed      normal LV systolic function with significantly enlarged left atrium      at 5.2-cm.  I think it is very reasonable to continue with rate      control at this time as well as long-term anticoagulation as long      as it is tolerated.  We will continue with metoprolol tartrate 50      mg twice daily as well as Cardizem sustained release 120 mg once      daily.  He gets  his anticoagulation checked and monitored by Dr.      Harrell Lark.  2. Coronary artery disease:  No evidence of angina at this time.  We      will continue aspirin and metoprolol.  Most recent LDL was 70 with      an HDL of 39.  We will continue to avoid statins      for now due to his history of cryptogenic liver failure.  3. Hypertension.  Blood pressure is well controlled.  The patient will      follow up in 6 months from now or earlier if needed.     Kathlyn Sacramento, MD  Electronically Signed    MA/MedQ  DD: 07/14/2010  DT: 07/15/2010  Job #: [id number]

## 2011-03-23 NOTE — Assessment & Plan Note (Signed)
[facility] OFFICE NOTE   [name], [name]                   MRN:          [id number]  DATE:09/18/2009                            DOB:          01/25/1951    CHIEF COMPLAINT:  Atrial fibrillation.   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  The patient also has premature coronary disease and had his first  myocardial infarction in 1980.  He subsequently had a coronary artery  bypass surgery performed at [facility] in 1982.  He has not had  any further coronary interventions since that time.  His first kidney  transplant was in 0000000, was complicated by CMV infection.  That kidney  failed and he subsequently had a kidney and liver transplant in 2008.  The patient states that he first developed atrial fibrillation after he  had a gallbladder surgery approximately 7 or 8 years ago.  He states he  has been treated with aspirin and rate control agents since that time.  He cannot tell when he is in or out of atrial fibrillation, although he  does have chronic fatigue and has had this for the past 2 years.  He  states this has greatly affected his quality of life, but he is unsure  if it relates to the atrial fibrillation.  Approximately, 1 month ago,  the patient had an exploratory laparotomy and repair of a ventral hernia  at [facility].  Per the record, an echocardiogram at that time  showed a mildly decreased systolic function, although the report is  currently not available.  He was discharged on Coumadin  with the intent  of subsequent cardioverstion.  Since that time, the patient states his  INR has been around 2, however, there was a change in the formulation of  Coumadin and a questionable change in his dosage and is INR became as  high as 7.1 which was treated with vitamin K causing it to become lower  than 0.9.  This all occurred in the past week and half.  His most recent  INR in November 9 was 0.9.  The patient denies any episodes of chest  discomfort, but does experience fatigue and some dyspnea on exertion  that is chronic in nature.  There is no lower extremity edema, PND,  orthopnea or syncope.  He does state that he bruises very easily and has  had ITP for quite some time.   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  Of  note, the patient states his last left heart catheterization was  approximately 2-1/2  years ago at [facility].   SOCIAL HISTORY:  No tobacco.  No alcohol.   FAMILY HISTORY:  Negative for premature coronary artery disease.   ALLERGIES:  No known drug allergies.   MEDICATIONS:  1. Aspirin 81 mg daily.  2. Coumadin 5 mg daily.  3. Prograf 1 mg b.i.d.  4. CellCept 250 mg b.i.d.  5. Prednisone 5 mg daily.  6. Omega-3 fatty acids 1000 mg b.i.d.  7. Seroquel 100 mg daily.  8. Zoloft 100 mg daily.  9. Prilosec 20 mg daily.  10.Lopressor 50 mg b.i.d.  11.Diazepam p.r.n.   REVIEW OF SYSTEMS:  As in HPI.  Other systems are reviewed and are  negative.   PHYSICAL EXAMINATION:  VITAL SIGNS:  The patient's blood pressure is  114/65, heart rate is 103, he weighs 198 pounds, sating 98% on room air.  GENERAL:  No acute distress.  HEENT:  Normocephalic, atraumatic.  NECK:  Supple.  There is no JVD.  There is no carotid bruits.  HEART:   Irregularly irregular.  There is no murmurs.  LUNGS:  Clear bilaterally.  ABDOMEN:  There are multiple well healed incisional scars.  He is  nontender.  EXTREMITIES:  Without edema.  SKIN:  Warm and dry.  MUSCULOSKELETAL:  The patient has 5/5 bilateral upper and lower  extremity strength.  NEURO:  Nonfocal.  PSYCH:  Appropriate with normal levels of insight.   EKG from today independently interpreted by myself demonstrates atrial  fibrillation with a ventricular rate of 94 beats per minute.  There is  delayed R-wave progression and nonspecific ST and T-wave abnormalities.   Review of patient's labs:  BMP from November 1 showed a sodium of 140,  potassium 4.5, chloride 104, CO2 29, creatinine 1.6, BUN 17, glucose 98,  alka phos 65, ALT 20, AST 22, total bili 0.9, total protein 8.9.  The  patient's total cholesterol is 160, HDL 39, triglycerides 153, LDL 90.   ASSESSMENT:  A 60 year old white male with coronary artery disease,  hypertension, status post kidney and liver transplant, thrombocytopenia  who is in atrial fibrillation and likely has been in it for the past  month.  He has a history of paroxysmal atrial fibrillation.  It is  difficult to quantify the frequency of the atrial fibrillation as he has  been asymptomatic from it.  His CHADS2 score is 1 - 2 as he has  hypertension, but his history of heart failure is not entirely clear.  He does have chronic dyspnea on exertion and fatigue; however, he does  not have any signs of heart failure on exam.  We currently do not have  an echo report in evaluating his left ventricular systolic function or  his diastology.  He has thrombocytopenia which makes anticoagulation  with Coumadin more risky; however, without the Coumadin, he is at  increased risk for CVA, especially if his EF is reduced.  The liver and  kidney transplant combined with the fact that the patient has coronary  artery disease and a possibly decreased left ventricular  systolic  function precludes the use of many antiarrhythmics.   PLAN:  We would like to obtain the echocardiogram that was done at [place]  last year.  For now, the patient should maintain on Coumadin as  monitored by his primary care physician, who was following him closely  for this.  Once the echocardiogram is obtained, we will make a final  decision regarding rate versus rhythm control.  It may be most  reasonable to anticoagulate the patient for 3 more weeks followed by a  cardioversion or perform a TEE cardioversion with subsequent  anticoagulation for 3 more weeks to see if the patient is able to  maintain a normal sinus rhythm without any antiarrhythmic treatments.  If long-term he proves to maintain in sinus rhythm and he has a normal  ejection fraction, aspirin therapy alone will likely be sufficient.  We  will contact the patient next week after the results of his  echocardiogram are obtained.     Arlee Muslim, MD  Electronically Signed    SGA/MedQ  DD: 09/18/2009  DT: 09/19/2009  Job #: [id number]

## 2011-03-23 NOTE — Assessment & Plan Note (Signed)
[facility] OFFICE NOTE   [name], [name]                   MRN:          [id number]  DATE:01/27/2010                            DOB:          04-09-1951    PROBLEM LIST:  1. Atrial fibrillation.  In October of 2010 the patient underwent      hiatal hernia surgery at which time he was felt to be in persistent      atrial fibrillation and was placed on Coumadin.  He underwent      cardioversion at the end of last year.  However, the atrial      fibrillation reoccurred.  2. Coronary artery disease status post coronary artery bypass graft in      1982.  3. Cryptogenic cirrhosis and end-stage renal disease status post liver      and kidney transplant 2008.  4. Idiopathic thrombocytopenia.  5. Hypertension.  6. Hyperlipidemia.   INTERVAL HISTORY:  Since last office visit the patient was admitted to  [facility] with pneumonia.  During that episode of pneumonia he  had atrial fibrillation with rapid ventricular response and was started  on Cardizem and digoxin.  He also had an echocardiogram which  demonstrated an ejection fraction of 60% which was improved from echo at  the end of December showing 45%-50%.  The patient states since discharge  he continues to have cough and some shortness of breath.  He denies any  dizziness or chest discomfort.  Of note while in the hospital his  Coumadin was also stopped as his hemoglobin had decreased although that  lab is not currently available.  He was discharged off the Coumadin with  plans to possibly restart it as an outpatient.  He continues to have  issues with generalized weakness and depression.  He states he has been  depressed for most of his life.   PHYSICAL EXAM:  VITAL SIGNS:  Today his blood pressure is 88/48.  His  pulse is 74, satting 97% on room air and he weighs 211 pounds.  GENERAL:  No acute distress.  HEENT:  Normocephalic,  atraumatic.  NECK:  Neck is supple.  HEART:  Irregular irregular.  Normal rate.  No murmur, rub or gallop.  LUNGS:  Clear to auscultation bilaterally.  ABDOMEN:  Soft, nontender, nondistended.  EXTREMITIES:  Without edema.   HOSPITAL COURSE:  Review of the patient's hospital course as above and  in HPI.   INTERVAL HISTORY:  As above and in interval history.   ASSESSMENT AND PLAN:  1. Atrial fibrillation.  The patient is rate controlled today.  In      addition to the metoprolol 50 mg b.i.d. he was on prior to hospital      he is now also on Cardizem CD 240 mg and Lanoxin 0.125 mg daily.      His blood pressure is low today which may be contributing to his      weakness.  He also had adequate rate control on a Holter monitor      prior to his hospital admission.  I think that the rapid  ventricular response he has experienced in the hospital is likely      secondary to his acute illness.  Today I will decrease his Cardizem      to 120 mg daily.  I will also check a digoxin level.  As he has a      history of renal transplant there is some concern that if his renal      function were to worsen the digoxin would clearly build up and may      have a negative impact on his cardiovascular status.  We may decide      to stop this medicine in the future.  The patient will continue to      follow up with Dr. Harrell Lark and his Coumadin should be restarted if it      is felt that he is not having any issues with bleeding.  Again, INR      should be maintained 2 and 2.5.  2. Coronary artery disease.  He is not having any symptoms consistent      with angina.  He should continue on aspirin and beta-blocker.  His      LDL November 2010 was 90 and HDL of 39.  3. History of hypertension.  His blood pressure is mildly hypotensive      today and the above medical changes will be made.  4. Kidney and liver transplant.  He follows up with Dr. Tamala Julian, his      gastroenterologist, at [facility] several times a  year.  5. We will see the patient back in clinic in six weeks' time.  He will      also follow up with Dr. Harrell Lark regarding possible reinitiation of      Coumadin therapy.     Arlee Muslim, MD  Electronically Signed    SGA/MedQ  DD: 01/28/2010  DT: 01/28/2010  Job #: [id number]   cc:   [name], M.D.

## 2011-03-23 NOTE — Assessment & Plan Note (Signed)
[facility] OFFICE NOTE   [name], FOERST                   MRN:          [id number]  DATE:10/17/2009                            DOB:          08-09-1951    PROBLEM LIST:  1. Paroxysmal atrial fibrillation for 8 years.  However, the patient      appears to have been in atrial fibrillation consistently since      hernia surgery in October of this year.  2. Coronary disease status post coronary artery bypass graft in 1982.  3. 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  4. Idiopathic thrombocytopenia.  5. Hypertension.  6. Hyperlipidemia.   INTERVAL HISTORY:  The patient states since his last visit, he has  continued to feel fatigued.  He denies any palpitations, chest  discomfort or syncopal episodes.  He occasionally feels dizzy upon  standing which quickly resolves.  He has been having his INRs monitored  closely by his primary care physician.  He has not had any issues with  excessive bruising or GI bleeding.  He does endorse cutting his head and  having some issues with stopping the bleeding there, however, this has  since resolved.  The patient again states his desire to come off  Coumadin if at all possible.  However, he realizes that he is tolerating  it reasonably well.  The patient does endorse cold-like symptoms for the  past week or so.  Specifically, he has been coughing and it is mildly  productive.  He has not had any fevers.   PHYSICAL EXAMINATION:  VITAL SIGNS:  Blood pressure 107/63, pulse was  110, satting 95% on room air.  He weighs 205 pounds.  GENERAL:  No acute distress.  HEENT:  Normocephalic, atraumatic.  HEART:  Irregularly irregular with no murmur, rub or gallop.  LUNGS:  Clear bilaterally.  ABDOMEN:  Soft and nontender.  EXTREMITIES:  Without edema.  SKIN:  Warm and dry.   DIAGNOSTICS:  EKG from today  independently interpreted by myself  demonstrates atrial fibrillation with a ventricular rate of 89 beats per  minute.  There are nonspecific ST/T wave abnormalities and also  nonspecific interventricular conduction delay.   ASSESSMENT/PLAN:  1. Atrial fibrillation.  The patient appears to have been in atrial      fibrillation since October of this year.  It is not entirely clear      how often he has had this arrythmia prior to October.  He is on      Coumadin for anticoagulation and Lopressor 50 mg b.i.d. for rate      control.  We will obtain the INR's from his primary care physician      for the past 3 weeks.  If these have all been within therapeutic      range, we will proceed with a DC cardioversion.  If the patient is      unable to maintain an atrial fibrillation after the cardioversion,      we could consider antiarrhythmic therapy.  However, we must  keep in      mind the patient has had a liver and kidney transplant.  Although      he has normal kidney and liver function now, this could become an      issue with metabolism of the medication in the future.  2. Coronary artery disease.  The patient does not have any symptoms      consistent with angina.  He should continue on aspirin and beta-      blocker.  His last LDL in November of this year was 90 and his HDL      was 39.  3. Hypertension.  The patient's blood pressure is under excellent      control.  4. Kidney and liver transplant.  The patient follows up with [facility]      every 6 months regarding his rejection medications.     Arlee Muslim, MD  Electronically Signed    SGA/MedQ  DD: 10/17/2009  DT: 10/17/2009  Job #: [id number]

## 2011-03-23 NOTE — Assessment & Plan Note (Signed)
[facility] OFFICE NOTE   [name], HONKOMP                   MRN:          [id number]  DATE:11/25/2009                            DOB:          [birth year]    PROBLEM LIST:  1. Atrial fibrillation for a year.  This patient in October of this      year underwent hiatal hernia surgery at which time he was thought      to be in persistent atrial fibrillation and was placed on Coumadin.      He underwent cardioversion at the end of December, however, was      readmitted several days later and was found to be in atrial      fibrillation.  2. Coronary artery disease status post coronary artery bypass graft in      1982.  3. 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  4. Idiopathic thrombocytopenia.  5. Hypertension.  6. Hyperlipidemia.   INTERVAL HISTORY:  The patient states that since his cardioversion he  has been getting along quite well.  When he was an inpatient in the  hospital he was told that he had gone back into atrial fibrillation, but  again he remains asymptomatic from it.  He has been on aspirin 81 mg  daily in addition to Coumadin and has had no issues with bleeding or  with severe bruising.  He has had his Seroquel dose increased and has  had improved mood since this has happened.  He states that regarding his  atrial fibrillation, he wishes to take the simplest approach to  addressing it.   PHYSICAL EXAMINATION:  VITAL SIGNS:  Today, his blood pressure is  103/62, his pulse is 80, he is sating 99% on room air, and he weighs 200  pounds which is 5 pounds less than he weighed the beginning of December.  GENERAL:  He is in no acute distress.  HEENT:  Normocephalic, atraumatic.  NECK:  Supple.  HEART:  Irregularly irregular without murmur, rub, or gallop.  LUNGS:  Clear bilaterally.  ABDOMEN:  Soft and nontender.  EXTREMITIES:   Without edema.   EKG taken today in clinic independently, reviewed by myself demonstrates  atrial fibrillation with a ventricular rate of 87 beats per minute.  There is a nonspecific interventricular conduction delay.   ASSESSMENT/PLAN:  1. Atrial fibrillation.  We will continue the patient on Coumadin and      Lopressor as indicated in the chart.  We will place him on a 48-      hour Holter monitor to assess for adequate rate control.  We will      check [facility] to see if an echocardiogram was performed      during his last admission.  If not we will repeat an echo on his      last visit to see if his ejection fraction remains at least 45%.      The patient is aware that because of his thrombocytopenia he is at      slightly  increased risk for bleeding and he understands this risk.      His INR is being followed by Dr. Ward Givens and we asked that to      be maintained between 2 and 2.5  2. Coronary disease.  He is not having any symptoms consistent with      angina.  He should continue on aspirin and beta-blocker.  His Last      LDL in November 2010 was 90 and his HDL was 39.  3. Hypertension.  His blood pressure is under excellent control.  4. Kidney and liver transplant.  The patient will follow up with Dr.      Tamala Julian, his gastroenterologist, at [facility] within the next couple of      months.  We will see the patient back in the two months' time.      Arlee Muslim, MD  Electronically Signed    SGA/MedQ  DD: 11/25/2009  DT: 11/26/2009  Job #: [id number]   cc:   [name], M.D.

## 2011-03-23 NOTE — Assessment & Plan Note (Signed)
[facility]ARDIOLOGY OFFICE NOTE   [name], [name]                   MRN:          [id number]  DATE:03/11/2010                            DOB:          [birth year]    PROBLEMS LIST:  1. Atrial fibrillation in October 2010.  The patient underwent hiatal      hernia surgery at which time he was found to be in atrial      fibrillation and was placed on Coumadin.  He underwent      cardioversion at the end of last year.  However, atrial      fibrillation has reoccurred several times since then.  2. Coronary artery disease, status post CABG in 1982.  3. Cryptogenic cirrhosis and end-stage renal disease, status post      liver and kidney transplant 2008 at [facility].  4. Idiopathic thrombocytopenia.  5. Hypertension.  6. Hyperlipidemia.   INTERVAL HISTORY:  The patient states there has been no real change in  his status since his last visit.  He has restarted Coumadin therapy and  has been tolerating it well without any hematochezia, melena, or other  evidence of bleed.  He is off the digoxin and a lower Cardizem dose and  has not had any recurrence of symptomatic rapid ventricular response.  He denies any chest pain or significant dyspnea on exertion.  He  continues to have chronic fatigue and depression.   PHYSICAL EXAMINATION:  VITAL SIGNS:  His blood pressure is 105/63, pulse  78, he weighs 210 pounds and satting 97% on room air.  GENERAL:  He is in no acute distress.  HEENT:  Normocephalic, atraumatic.  NECK:  Supple.  There is no JVD in the seated position.  HEART:  Regular rate and rhythm without murmur, rub, or gallop.  LUNGS:  Clear bilaterally.  ABDOMEN:  Soft, nontender, nondistended.  EXTREMITIES:  Without edema.  PSYCHIATRIC:  The patient is appropriate.   Review of the patient's labs since his last visit, his hemoglobin 9.9,  rechecked on 22nd it was 11.2, CMP within normal limits except for a  mildly elevated glucose of 159, BUN of 25, and a creatinine of 1.5, his  digoxin level is 0.97 has since been stopped.  His BNP was 42-40.   ASSESSMENT AND PLAN:  1. Atrial fibrillation.  The patient has previously worn a cardiac      event monitor showing him to be adequately rate controlled on the      current medical regimen.  He should continue on warfarin.  His      hemoglobin was 9.9 on March 18, rechecked it was 11.2 on March 22.      He should continue on Coumadin as monitored by Dr. Harrell Lark.  2. Coronary artery disease.  He is not having any angina.  He should      continue on aspirin and beta-blocker in November 2010.  His LDL was      70, HDL 39.  3. History of hypertension.  The patient although appears to be      hypertensive, his blood pressure is adequate today.  4. Kidney and liver transplant has followed by [facility].  The patient will      continue to be followed up by Dr. Harrell Lark for primary care issues.     Arlee Muslim, MD  Electronically Signed    SGA/MedQ  DD: 03/11/2010  DT: 03/12/2010  Job #: [id number]   cc:   [name], M.D.

## 2011-03-23 NOTE — Assessment & Plan Note (Signed)
[facility]IOLOG[facility] OFFICE NOTE   JAFFER, DINSDALE                   MRN:          [id number]  DATE:11/24/2010                            DOB:          November 09, 1950    HISTORY OF PRESENT ILLNESS:  [name] is a 60 year old gentleman who  is here today for a followup visit.  He has the following problem list:  1. Atrial fibrillation which was diagnosed in October 2010.  This is      currently permanent.  2. Coronary artery disease status post coronary artery bypass graft      surgery in 1992.  Most recent cardiac catheterization was done in      2008 at [facility] which showed a totally occluded LAD, LCX, and RCA.      Old grafts were patent except SVG to OM1.  His other grafts      included SVG to OM III, SVG to ramus, and LIMA to LAD.  Ejection      fraction is normal.  3. End-stage renal disease, status post renal transplant in 1997.  He      developed end-stage renal and liver disease in 2008 and underwent      kidney and liver transplant in 2008.  4. Idiopathic thrombocytopenia.  5. Hyperlipidemia.  Currently not on any statins due to previous      history of cryptogenic liver failure.   INTERIM HISTORY:  Mr. Hefferon was admitted to [facility]  recently for 4 days due to fever for an known origin.  This is not a new  thing for him.  He was treated with antibiotics.  He was tachycardic  during his hospitalization.  His platelet count also dropped further to  about to 40,000 and thus his aspirin was this discontinued.  He  continues to be on anticoagulation with warfarin with no reported  bleeding events.  He denies any chest pain at this time.  He has chronic  exertional dyspnea.  There has been no palpitations, presyncope, or  syncope.   PHYSICAL EXAMINATION:  VITAL SIGNS:  Weight is 202.2 pounds, blood  pressure is 94/58, pulse is 75, oxygen saturation is 97% on room air.  NECK:  No JVD or  carotid bruits.  LUNGS:  Clear to auscultation.  HEART:  Irregularly irregular with no gallops or murmurs.  ABDOMEN:  Benign, nontender, and nondistended.  EXTREMITIES:  With no clubbing, cyanosis, or edema.   IMPRESSION:  1. Chronic atrial fibrillation.  His heart rate is reasonably      controlled at this time with Cardizem 120 mg once daily as well as      metoprolol tartrate 50 mg twice daily.  Both of these will be      continued.  We will continue also with long-term anticoagulation      with warfarin.  2. Coronary artery disease.  No evidence of angina at this time.  I      agree with stopping aspirin as the risk outweighs the benefit at      this time, especially with his further drop in his platelets.  He      continues to be on anticoagulation with warfarin.  He is not on a      statin due to history of cryptogenic liver failure.  3. Hypertension:  Blood pressure is controlled and actually on the low      side, but will not      decrease any of the medication at this time to prevent tachycardia.      He will follow up with me in 6 months from now or earlier as      needed.     Kathlyn Sacramento, MD  Electronically Signed    MA/MedQ  DD: 11/24/2010  DT: 11/25/2010  Job #: [id number]

## 2011-03-26 NOTE — Discharge Summary (Signed)
[name], [name]            ACCOUNT NO.:  [id number]   MEDICAL RECORD NO.:  [id number]          PATIENT TYPE:  INP   LOCATION:  5040                         FACILITY:  [facility]   PHYSICIAN:  Caren Griffins B. Lorrene Reid, M.D.DATE OF BIRTH:  12/05/1950   DATE OF ADMISSION:  11/20/2006  DATE OF DISCHARGE:  11/24/2006                               DISCHARGE SUMMARY   DISCHARGE DIAGNOSES:  1. Enterobacter bacteremia, source unclear.  2. 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  3. Cryptogenic cirrhosis.  4. Hypertension.  5. Coronary artery disease, history of coronary artery bypass graft.  6. Secondary hyperparathyroidism.  7. Anemia of chronic disease.  8. Gout.   PROCEDURES:  None.   CONSULTATIONS:  None.   HISTORY OF PRESENT ILLNESS:  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  Patient is followed  by Dr. Lorrene Reid in the office, and in preparation of impending  hemodialysis the patient underwent a right radial AV fistula creation  November 16, 2006, by Dr. Curt Jews.  He tolerated the procedure well and  was in his usual state of health until the night of January 10 when he  fell acutely ill with fever, chills, nausea, vomiting and general  malaise.  He said he felt especially short of breath and self-medicated  himself with increased Lasix dosing, of which he took 240 mg yesterday.  He was worried he might be fluid overloaded.  He has also been taking  Tylenol as needed to keep temperatures down.  His fever has gone as high  as 105 at home.  Today he recalls feeling especially bad and presenting  to the emergency room with a  temperature of 102.7 and blood pressures of  140/90.  The patient currently denies upper respiratory symptoms, cough,  orthopnea, chest pain or abdominal pain.  He says his kidney transplant  does not hurt any more than usual and denies dysuria.  He has had no  swelling in his legs, and his cellulitis of his legs has resolved  completely.  He denies any open wounds, diarrhea, or being around any  acutely ill people.   Admission chest x-ray shows no active cardiopulmonary disease with  COPD/emphysema.   ADMISSION LABORATORIES:  WBC 8.8, hemoglobin 11.3, hematocrit 34.3,  platelets 60.  Potassium 3.3, glucose 107, creatinine 4.5, BUN 59.  AST  32, ALT 22, total protein 6.8, albumin 2.8, calcium 9.2, bilirubin 6.8.   HOSPITAL COURSE:  1. Enterobacter bacteremia with source unclear at discharge.  Patient      was admitted and empirically started on vancomycin as well as IV      Avalox.  Chest x-ray was performed, with unclear source, given      patient's fever.  Urinalysis was negative for bacteria.  Blood      cultures positive.  There was  a question at time of discharge      whether this bacteremia could be secondary to placement of AV      fistula one week ago.  Patient did remain afebrile 24 hours prior      to discharge.  Nausea, vomiting and diarrhea did improve.      Patient's appetite did improve, and he was tolerating solids at      time of discharge.  WBCs did remain normal.  Patient was changed to      p.o. Cipro prior to discharge.  2. 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  Patient's baseline creatinine is noted to be in the 4      range.  He did have an increase in creatinine on November 22, 2006,      to 5.18.  Patient was given normal saline, although he did not      appear volume depleted on exam.  Baseline creatinine did return to      his range, and discharge creatinine was 4.5.  Patient was continued      on immunosuppressors at  admission to include CellCept and      cyclosporin.  Cyclosporin level was 70, and although low, was      within range, per rounding nephrologist, at this stage of chronic      kidney disease.  Patient was also started on stress steroids to      include Solu-Medrol, and oral prednisone was placed on hold but has      been tapered.  Of note, initially cyclosporin was decreased on      admission, given patient's nausea and vomiting at presentation.  3. Hypertension.  Lasix had been placed on hold secondary to patient's      presentation on admission.  Blood pressures did range between the      130s to [id number] range systolic.  Labetalol was also increased from 300      mg twice a day to 300 mg three times a day.  Blood pressure at time      of discharge was 137/74.  4. Coronary artery disease, history of coronary artery bypass graft.      No intervention at this time.  5. Secondary hyperparathyroidism.  Corrected calcium on date of      discharge was 9.3 and higher corrected.  Therefore, patient's      vitamin D was decreased from 0.5 mcg once a day to 0.25 mcg daily      at time of discharge; we will follow this as an outpatient.  6. Anemia of chronic disease.  Patient continued on his Procrit as an      outpatient.  He currently receives this every two weeks.  No issues      with his hemoglobin this admission, was 9 at time of discharge.  7. Cryptogenic cirrhosis.  On admission, patient's bilirubin had      increased to 6.8.  Ammonia levels were normal, and patient was      placed on lactulose.  LFTs were within normal range.  No diagnostic      studies were done, and bilirubin did trend down this admission back      to baseline.  Patient is to be seen in the transplant clinic at      [facility] on December 14, 2006, and will be followed at that time.  8. Gout.  Patient continued on outpatient dose of allopurinol  at 200      mg daily.  There was no further intervention.  DISCHARGE MEDICATIONS:  1.  Cyclosporin 125 mg one p.o. q.h.s.  2. CellCept 250 mg b.i.d.  3. Protonix 40 mg h.s.  4. Ferrous sulfate 325 mg daily.  5. Sodium bicarbonate 650 mg t.i.d.  6. Zoloft 100 mg daily.  7. Zocor 20 mg q.h.s.  8. Lactulose 30 mL two b.i.d.  9. Lasix 80 mg daily.  10.Allopurinol 200 mg daily.  11.Procrit 29,500 units subcu bimonthly, due one week post discharge.  12.Labetalol 300 mg t.i.d.  13.Prednisone 5 mg daily p. c.  14.Cipro 500 mg daily x9 days.  15.Calcitriol 0.25 mcg p.o. daily.   DISCHARGE INSTRUCTIONS:  Patient is discharged without activity  restrictions.  He is to resume a renal diet as before.  He is to follow  up with Dr. Lorrene Reid in the office on January 23 and to follow up at [facility]  on December 14, 2006.  Patient is to call our office with any questions  or concerns.      Leafy Kindle, PA    ______________________________  Elzie Rings Lorrene Reid, M.D.    MY/MEDQ  D:  11/24/2006  T:  11/24/2006  Job:  [id number]   cc:   [facility]  Mayme Genta, M.D.

## 2011-03-26 NOTE — Discharge Summary (Signed)
[name], [place] NO.:  [id number]   MEDICAL RECORD NO.:  [id number]          [name] TYPE:  INP   LOCATION:  5525                         FACILITY:  [facility]   PHYSICIAN:  Caren Griffins B. [name], M.D.DATE OF BIRTH:  [birth year]   DATE OF ADMISSION:  12/06/2006  DATE OF DISCHARGE:  12/17/2006                               DISCHARGE SUMMARY   DISCHARGE DIAGNOSES:  1. End-stage renal disease.  2. Cryptogenic cirrhosis.  3. Pancytopenia.  4. Hypertension.  5. Secondary hyperparathyroidism.  6. Gout.  7. Coronary artery disease.  8. Hyperlipidemia.  9. Depression.   DISCHARGE MEDICATIONS:  1. Prednisone (5 mg) one tab p.o. daily.  2. Cyclosporin (100 mg) one tab p.o. q.h.s.  3. Zoloft (100 mg) one tab p.o. daily.  4. Protonix (40 mg) one tab p.o. q.h.s.  5. Allopurinol (100) two tablets daily.  6. PhosLo (667) one tab p.o. with meals (t.i.d.).  7. Nephrovite one tablet daily.  8. Labetalol (300 mg) one tab p.o. t.i.d.  9. Zocor (20 mg) one tab p.o. daily.  10.Lactulose 30 mL b.i.d., goal - three bowel movements a day.   CONSULTS:  1. Dr. Deitra Mayo, CVTS  2. Dr. Richmond Campbell (GI)   PROCEDURES:  1. Right intrajugular Diatek catheter placement on December 13, 2006 by      Dr. Scot Dock.  2. Abdominal CT that showed:  1)  Cirrhosis, portal hypertension.  2)      Shrunken thin kidneys.  3)  Moderate bilateral pleural effusions.      4)  Small amount of ascites.   FOLLOWUP:  1. Hemodialysis in [place] on December 20, 2006 at 10:00 a.m.  2. [name] will call for a followup appointment with Dr. Tamala Julian at [facility]      for a liver transplant evaluation, and with Dr. Tamala Julian (renal) to      follow up status post renal transplant.   BRIEF HISTORY, PHYSICAL EXAM, AND LABORATORY DATA ON ADMISSION:  A 42-  year-old white male with history of renal transplant and chronic kidney  disease with baseline creatinine of 4, cryptogenic cirrhosis, who was  discharged on  November 24, 2006 after enterobacter bacteremia treated  with Cipro, associated with jaundice, increased bilirubin to 6.5.  Approximately around January 7, [name] had a recurrence of nausea and  vomiting with generalize malaise.  Labs drawn on January 28 at [facility] show a bilirubin of 4.9, SGOT 115, SGPT 113, alkaline  phosphatase of 499.  Dr. Lorrene [name] attempted to reach the [name] on the  phone unsuccessfully.  On January 29 day of admission, [name] was  examined in the office and he was sent for admission for further workup  secondary to increased LFTs, nausea, vomiting and generalize malaise.  [name] has finished Cipro by January 28.  [name] denying abdominal  pain, but he thinks that he may have fevers.   REVIEW OF SYSTEMS:  Positive for chills, nausea, vomiting.  No  significant abdominal pain except on the left side.  Worsening fatigue  and shortness of breath.   PHYSICAL EXAMINATION:  VITALS:  Temperature 98.8, blood pressure 180/90,  pulse 80, weight 178 pounds.  ABDOMEN:  Presented with mild left-sided tenderness, positive bowel  sounds, no rebound, no guarding.  Prominent Capute medusae, positive  ascites with fluid wave.  RESPIRATORY EXAM:  He presented with bilateral dry crackles  approximately halfway up with decreased breath sound at bases.  EXTREMITIES:  Presented with 1+ lower extremity edema.   The rest of physical exam was unremarkable.   [name] was admitted for further workup and GI, Dr. Earlean Shawl, was  consulted secondary to increased LFTs.   HOSPITAL COURSE:  1. End-stage renal disease:  [name] is status post renal transplant.      Home medications at time of admission were Cellcept 200 mg b.i.d.,      cyclosporin 125 mg q.h.s. and prednisone 5 mg daily.  Creatinine      levels increased throughout the rest of hospitalization.  [name]      has a right Cimino AV fistula created for Dr. Donnetta Hutching on December 06, 2006.  The fistula was immature;  therefore, Dr. Scot Dock was      consulted and placed a Diatek cath on the right internal jugular      vein on December 13, 2006.  Unfortunately, the [name] is currently      again on hemodialysis.  His schedule is Tuesday, Thursday, and      Saturdays.  After initially on Lasix and after hemodialysis,      volumes and electrolytes were stable.  [name] is clinically      stable.  He was discharged with a hemoglobin of 10 and will require      Epo 15,000 units with each hemodialysis and Hectorol 1 mcg with      hemodialysis.  The time of hemodialysis which is 3.5, BFR which is      300, and VFR which is 700 will be increased gradually during the      following hemodialysis.  [name] is to call Dr. Tamala Julian who performed      the renal transplant at [facility] for followup.  We have maintained      cyclosporin, but it was decreased to 100 mg q.h.s. and prednisone      continued to be 5 mg daily.  Other medications were held secondary      to pancytopenia. Immunosuppressives were not completely      discontinued secondary to the [name] being a liver transplant and      this may have been stones; therefore, he needs immunosuppression.  2. Cryptogenic cirrhosis:  Given the increased LFTs, GI was consulted.      Dr. Earlean Shawl after reviewing the abdominal CT (see procedure summary)      decided there was no evidence of ductal dilation, but could      represent possibility of intrahepatic cholestasis(differential      includes sepsis, viral infection, drug induced, or autoimmune      disease).  Since the [name] was currently on immunosuppression,      the abdominal causes for intrahepatic cholestasis were less likely.      Also hepatitis A, B, C, Cytomegalovirus were negative.  [name] virus positive for old infection.  LFTs improved markedly with      IV fluids.  [name] became clinically stable with no longer      abdominal pain.  GI signed off.  [name] was allowed to go to [place]     with  one-day pass to meet  Dr. Tamala Julian for liver transplant.  [name]      has decided to pursue this procedure.  Despite [name]'s      pancytopenia, he needs to continue immunosuppression.  We did hold      CellCept, but cyclosporin and prednisone were continued.  By day of      discharge, [name] is clinically stable.  He will arrange a      followup appointment with Dr. Tamala Julian next week.  3. Pancytopenia:  [name] has a history of chronic thrombocytopenia      secondary to hyperhepatism and portal hypertension.  Initially WBC,      hemoglobin and platelet count decreased.  Throughout rest of      hospitalization they remained stable.  By day of discharge, WBC is      2.8, hemoglobin 10.1, platelets 47,000.  We held CellCept and      continued cyclosporin and prednisone since the [name] is a liver      transplant candidate and needs to be on immunosuppression.  This      issue to be followed by Dr. Tamala Julian (nephrology who performed the      kidney transplant).  [name] needs to arrange a followup      appointment with Dr. Tamala Julian at [facility] next week.  He was clinically      stable.  4. Gout:  Stable throughout rest of hospitalization, [name] was      treated with allopurinol.  5. Coronary artery disease:  Stable throughout the rest of      hospitalization.  [name] is on labetalol, Zocor.  6. Hyperlipidemia:  To be followed by primary physician.  [name] is      on Zocor 20 mg daily.  7. Depression:  [name] is on Zoloft 100 mg daily.  Mr. Amburn has a      long history of depression and has spent long periods of time on      therapy according to his neuron therapy.  He seems to be using the      tools that he has learned throughout the years of psychotherapy.      His coping mechanisms given his multiple medical issues and the      failure of the kidney transplant are apparently strong.  No      suicidal thoughts or plans.  He is considering to return to      psychotherapy.  This issue to be  followed by primary doctor.   LABORATORY DATA AT DISCHARGE:  Sodium 138, potassium 4, chloride 101,  PCO2 28, BUN 20, creatinine 3.25, glucose 75.  WBC 2.8, hemoglobin 10.1,  hematocrit 30.7, platelets 47.  MCV 85.9.      Lauraine Rinne, MD    ______________________________  Elzie Rings [name], M.D.    Maximino Sarin  D:  12/17/2006  T:  12/19/2006  Job:  [id number]

## 2011-03-26 NOTE — H&P (Signed)
NAMECYLAS, [place] NO.:  [id number]   MEDICAL RECORD NO.:  [id number]          PATIENT TYPE:  EMS   LOCATION:  MAJO                         FACILITY:  [facility]   PHYSICIAN:  Donato Heinz, M.D.DATE OF BIRTH:  1951-02-02   DATE OF ADMISSION:  10/23/2006  DATE OF DISCHARGE:                              HISTORY & PHYSICAL   CHIEF COMPLAINT:  Increasing leg pain, swelling, orthopnea and shortness  of breath.   HISTORY OF PRESENT ILLNESS:  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  The patient reports this started back in  August of 2007, has been treated with Keflex and resolved, and now has  recurred.  He also has had a 2-day history of increasing shortness of  breath and orthopnea, unable to lie flat due to shortness of breath.  He  said this did not respond to an extra dose of Lasix and was instructed  to come to the emergency room for further evaluation.   ALLERGIES:  He has no known drug allergies.   PAST MEDICAL HISTORY:  1. 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      a.     Status post cadaveric kidney transplant in 1997 at [facility].      b.     Stage IV chronic kidney disease.  Baseline creatinine 3.5-       3.9 over the last 7-8 months.  2. Hypertension.  3. Coronary artery disease status post CABG in 1992, status post MI in      1998.  Negative stress test in November of 2007 by Dr. Glade Lloyd.  4. Hypercholesterolemia.  5. Cryptogenic cirrhosis followed by Dr. Earlean Shawl.  6. Gout.  7. Depression.  8. Anemia of chronic disease, currently  on EPO.  9. Secondary hyperparathyroidism.  10.Fever of unknown origin in September of 2007.  11.History of left leg swelling.  Negative Dopplers in August and      September of 2007.  12.Sleep disturbances, chronic.  13.Nephrotic syndrome.  14.ITP with chronic thrombocytopenia.   CURRENT MEDICATIONS:  1. Cyclosporin 125 mg in the morning, 100 in the evening.  2. Normodyne 300 mg b.i.d.  3. CellCept 250 mg b.i.d.  4. Lasix 80 mg a day.  5. Prednisone 5 mg a day.  6. Zocor 20 mg a day.  7. Valium 10 mg p.r.n.  8. Allopurinol 200 mg a day.  9. Zoloft 100 mg a day.  10.Lactulose 30 mg b.i.d.  11.Sodium bicarbonate 650 mg b.i.d.  12.Calcitriol 0.25 mcg a day.  13.Iron sulfate one a day.  14.Micardis 40 mg a day.  15.Procrit 20,000 units every 2 weeks subcutaneous.  16.EpiPen p.r.n.   FAMILY HISTORY:  Mother is alive at age  41.  She is doing well and is 8  years out from breast cancer, currently on tamoxifen.  Father died at  age 34 from renal cell carcinoma.  He has one brother alive and well.  No other family history of kidney disease.   SOCIAL HISTORY:  He lives alone in [place].  He is currently disabled.  He was a Engineer, manufacturing systems.  He is single.  No children.  No tobacco, no  alcohol, no drug use.   REVIEW OF SYSTEMS:  As per HPI.  In general, he has had fevers, chills,  fatigue and loss of appetite.  HEENT:  No headaches, dysphagia,  odynophagia.  CARDIAC:  No chest pain, palpitations, but does have  orthopnea.  No PND.  PULMONARY:  He has had shortness of breath,  dyspnea, and a dry, nonproductive cough.  No hemoptysis.  No  hematemesis.  GI:  Has had some nausea.  No vomiting, no diarrhea.  No  hematochezia, melena or blood red blood per rectum.  GU: No dysuria,  pyuria, hematuria, urinary frequency or retention.  HEMATOLOGIC:  He has  diffuse arthralgias and myalgias.  DERMATOLOGIC:  Has erythema in his  left leg from his ankle to his knee.  All other systems  negative.   PHYSICAL EXAMINATION:  GENERAL:  This is a is well-developed, well-  nourished man in mild distress, lying in bed.  VITAL SIGNS:  Temperature is 99.8, pulse 96, respiratory rate 24, blood  pressure 168/81, pulse oximetry is 96% on room air.  HEENT:  Head normocephalic, atraumatic.  Pupils are equal, round and  reactive to light.  Extraocular muscles intact.  No icterus.  Oropharynx  without lesions.  NECK:  Supple.  Full range of motion.  No lymphadenopathy, no bruits.  LUNGS:  He had bibasilar crackles, left greater than right.  No dullness  to percussion.  CARDIAC:  Regular rate and rhythm.  No precordial rub appreciated.  He  did have a 2/6 systolic ejection murmur heard around the precordium.  EXTREMITIES:  He has 2+ pitting edema in his left leg with erythema from  his ankle up to his knee with some tenderness to palpation and warm to  touch.  Trace edema on the right.  ABDOMEN:  Normoactive bowel sounds, soft, nontender.  It was somewhat  distended.  No guarding or rebound.  He has an allograft in his left  lower quadrant without tenderness or bruits.  NEUROLOGIC:  He was awake, alert and oriented x3.  Cranial nerves II-XII  grossly intact.  No asterixis.   LABORATORY DATA:  White blood cell count was 8.6, hemoglobin 10.4,  platelets 61.  Other labs pending.   ASSESSMENT AND PLAN:  1. Cellulitis.  He has failed a second course of Keflex that was just      started on Thursday, and will start him on IV Zosyn, panculture and      continue to follow.  2. Peripheral edema/congestive heart failure.  The patient with      increased lower extremity edema, bibasilar crackles and orthopnea.      Will start him on IV Lasix 80 mg twice a day.  Will also check an      echocardiogram.  The patient does have known coronary disease.      Will rule out myocardial infarction, although he is not having any     chest pain.  Check a chest x-ray and continue to follow with IV  Lasix.  3. Chronic kidney disease, stage IV.  Will check a renal panel, try to      save his right arm, and consider vein mapping, as he has had      progressive rise in his creatinine.  Baseline has been 3.5-3.9.      His most recent creatinine was 4.1 in November of 2007 in our      office.  4. Cryptogenic cirrhosis.  He is being evaluated for possible liver      transplant.  Continue with lactulose for now.  5. Anemia. Will continue with EPO and iron.  6. Secondary hyperparathyroidism.  Continue with calcitriol and follow      calcium and phosphorus levels.  7. Chronic thrombocytopenia is stable, and actually is a little bit      higher than normal and has been as low as 51 and below.  Will      continue to follow for now.  8. Left leg pain.  Will give him oxycodone p.r.n.  Pain is likely      related to his cellulitis.  9. Depression.  Will continue with Zoloft and Valium as needed.           ______________________________  Donato Heinz, M.D.     JC/MEDQ  D:  10/23/2006  T:  10/24/2006  Job:  [id number]

## 2011-03-26 NOTE — Op Note (Signed)
[name], [name]            ACCOUNT NO.:  [id number]   MEDICAL RECORD NO.:  [id number]          PATIENT TYPE:  AMB   LOCATION:  SDS                          FACILITY:  [facility]   PHYSICIAN:  Rosetta Posner, M.D.    DATE OF BIRTH:  30-[name]-1951   DATE OF PROCEDURE:  11/16/2006  DATE OF DISCHARGE:                               OPERATIVE REPORT   PREOPERATIVE DIAGNOSIS:  Progressive chronic insufficiency.   POSTOP DIAGNOSIS:  Progressive chronic insufficiency.   PROCEDURE:  Creation of right Cimino AV fistula.   SURGEON:  Rosetta Posner, M.D.   ASSISTANT:  Faylene Million Dominick, PA-C   ANESTHESIA:  MAC.   COMPLICATIONS:  None.   DISPOSITION:  Discharged to recovery room, stable.   PROCEDURE IN DETAIL:  The patient is taken to the operating room and  placed in the prone position where the area of the right arm and right  breast were prepped and draped in the usual sterile fashion.  Incision  was made between the level of the cephalic vein; and the radial artery  at the wrist.  The cephalic vein was mobilized.  It was ligated distally  and divided.  The vein was gently dilated; and there was excellent size  of the cephalic vein.  This brought into approximation of the radial  artery which was also of normal caliber, with minimal calcification.  The artery was occluded proximal and distally; and was opened with an 11-  blade and extended longitudinally with the Potts scissors.   The vein was cut to the appropriate length, spatulated, and sewn end-to-  side to the vein with a running 6-0 Prolene suture.  Clamps were removed  and an excellent thrill was noted.  The wound was irrigated with saline.  Hemostasis with electrocautery.  The wound was closed with 3-0 Vicryl in  the subcutaneous and subcuticular tissue and then Steri-Strips were  applied.      Rosetta Posner, M.D.  Electronically Signed     TFE/MEDQ  D:  11/16/2006  T:  11/16/2006  Job:  [id number]

## 2011-03-26 NOTE — Discharge Summary (Signed)
[name]. [facility]  Patient:    [name], [name]                   MRN: [id number] Adm. Date:  [id number] Disc. Date: [id number] Attending:  Lance Sell Dictator:   Foye Clock, P.A. CC:         Infectious Disease             [name], M.D.                           Discharge Summary  ADMISSION DIAGNOSES: 1. Renal transplant patient with three days afebrile illness, development    of diffuse vesicular rash consistent with viral infection. 2. Chronic renal failure and allograft, stable creatinine. 3. Coronary artery disease with history of coronary artery bypass graft in    1992.  History of myocardial infarction in 1998, stable. 4. Hypertension.  DISCHARGE DIAGNOSES: 1. Vesicular rash, stain consistent with a varicella-type virus but negative    serum culture. 2. Elevated liver function tests, felt to be secondary to #1. 3. Thrombocytopenia, improving, thought to be secondary to #1. 4. Renal transplant patient with stable creatinine. 5. Coronary artery disease, status post coronary artery bypass graft, stable.  BRIEF HISTORY/REASON FOR ADMISSION:  [name] is a 60 year old Caucasian male who is status post cadaveric renal transplant in 0000000 complicated by allograft dysfunction.  Currently, creatinine is 2.3.  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  No other symptoms reported.  Cancelled his office visit and went to [facility] for lab work, where his white count was 4000 and had a left shift.  Platelets were 47,000, usually in the 100,000 range.  Creatinine was 2.3.  LFTs were normal at that time.  Chest x-ray was negative and sinus films showed no acute findings.  Blood cultures were sent.  He agreed to be seen in the office on January 20, 2000 and on arrival  reported onset the previous evening of vesicular-type lesions and these lesions spreading throughout his entire body.  He had a temperature maximum of 102 degrees.  Reports to have had chickenpox as a child.  PHYSICAL EXAMINATION:  GENERAL:  On physical exam by Dr. Lorrene [name], he appeared acutely ill.  VITAL SIGNS:  Temperature 102 degrees.  HEENT:  He had vesicles over his entire body.  Conjunctivae were injected.  He had no definite lymphadenopathy noted.  LUNGS:  Clear.  ABDOMEN:  Allograft was not tender to touch.  Bowel sounds positive.  EXTREMITIES:  He had no pedal edema.  LABORATORY DATA:  Blood cultures, urine culture, and chest x-ray report, on report from [facility] ER, were all negative.  Because of his fever and noted acute vesicular rash consistent with viral infection, he was admitted to [facility] with ID consult to be obtained.  Obtained a varicella antibody titer. Herpes cultures right stain was vesicular fluid.  Chest x-ray again.  Evaluate his shortness of breath, which was reported over the evening, and to rule out pneumonia.  HOSPITAL COURSE: #1 - VIRAL ILLNESS:  The patient was started on acyclovir IV q.12h. by Dr. Orene Desanctis.  He went over the smear of the vesicular fluid with the right stain reviewed with two pathologists in the pathology department and they agreed that there were nuclear  inclusions consistent with a herpes virus and, with his clinical picture, consistent with varicella.  Dr. Orene Desanctis recommended changing acyclovir to Valtrex.  By the second day, the patient was afebrile but felt like he was "wiped out."  His vesicular-type rash spread over his entire trunk and extremities.  Temperature rose to 101.3 during the evening of January 21, 2000.  He reported to be feeling better by January 23, 2000.  Fever was resolving and his weakness improving.  He was deemed ready for discharge home on January 26, 2000.  Noted that he did have herpes culture done from  his left hand lesion showing no herpes simplex virus noted, varicella zoster antibody IgM level was 0.5, which was negative, meaning no significant level of detectable varicella zoster virus IgM antibody noted; however, it was noted by Dr. Orene Desanctis, with review of his fluid smear, i.e. right stain with the two pathologists, that it appeared that he had herpes-type virus noting nuclear inclusions on the stain and his symptoms and rash were somewhat better with his treatment with the medications.  He was changed over the acyclovir 800 mg b.i.d. at the time of discharge and was to have follow-up in the office with a scheduled appointment.  Of note, his chest x-ray on admission showed no active disease.  #2 - RENAL TRANSPLANT:  Noted on admission, the patients creatinine was 2.3 and BUN was 28.  At the time of discharge, creatinine was down to 1.8 and BUN was 20.  Potassium normal throughout hospital stay.  Hemoglobin was 15.3 on admission and 14.1 at the time of discharge.  He had good urine output.  He had another chest x-ray that showed no pneumonia or volume overload.  He continued on his Neoral and CellCept and prednisone at the time of discharge.  #3 - ELEVATED LIVER FUNCTION TESTS:  Noted on admission, the patients AST was 72 and it rose to 98.  ALT 65 on admission and rose to 105 and it was down to 51 at the time of discharge.  Alkaline phosphatase was 123 at the time of discharge, coming down.  Total bilirubin was normal.  His LFTs were thought to be elevated secondary to his acute viral illness and will have follow-up in the hospital with his LFTs checked then.  #4 - THROMBOCYTOPENIA:  On admission, the patients platelets were 44,000 and white count was 3.7.  Noted as an outpatient, platelets were in the 100,000 range usually.  Followup platelets were 54,000 and white count was up to 4.7 at the time of discharge.  Levels will be checked as an outpatient in our office.  DISCHARGE  MEDICATIONS: 1. Labetalol 200 mg a.m., 100 mg at lunch, and 200 mg p.m. 2. Zoloft 75 mg q.d.  3. Aspirin 1 q.d. 4. ______ 80 mg 2 q.d. 5. Zocor 20 mg q.d. 6. Prednisone 5 mg q.d. 7. CellCept 750 mg 2 tablets q.d. 8. Neoral 150 mg 2 tablets q.d. 9. Acyclovir 800 mg 2 tablets q.d. DD:  04/05/00 TD:  04/07/00 Job: [id number]

## 2011-03-26 NOTE — Letter (Signed)
September 23, 2009    [name], M.D.  [place], [place] [address]   RE:  [name], [name]  MRN:  [id number]  /  DOB:  12-28-1950   Dear Dr. Harrell Lark:   I had the pleasure of seeing your patient, [name], in the  cardiology clinic.  As you know, he is a 60 year old white male with a  very complicated past medical history including a liver/kidney  transplant approximately 2 years ago who was referred to me for  management of atrial fibrillation.  The patient appears to have had  paroxysmal atrial fibrillation for the past 8 years.  However, recently  during a hernia repair at [facility], the patient went back into  atrial fibrillation and was found to have an ejection fraction of 45%.  Because the patient had hypertension and some degree of left ventricular  systolic dysfunction, he was initiated on Coumadin therapy.  Unfortunately, it appears there were some issues with his INR becoming  subtherapeutic after several weeks of Coumadin therapy.  I think the  most appropriate line of action is to continue anticoagulation with a  therapeutic INR for 3 weeks, at which point we will attempt a  cardioversion.  If the patient proves that he can maintain in sinus  rhythm after the cardioversion, we would consider stopping the Coumadin  therapy, as the patient has complained about easy bruisability and is  known to have thrombocytopenia.  We will also need to recheck an ECHO to  get a better handle on his heart failure status, which will be important  in determining the need for long term anticoagulation if the atrial  fibrillation reoccurs.   Thank you for the referral of this patient, and I look forward to  following him along with you.    Sincerely,      Arlee Muslim, MD  Electronically Signed    SGA/MedQ  DD: 09/23/2009  DT: 09/23/2009  Job #: [id number]

## 2011-03-26 NOTE — H&P (Signed)
[name], [name]            ACCOUNT NO.:  [id number]   MEDICAL RECORD NO.:  [id number]          PATIENT TYPE:  INP   LOCATION:  5040                         FACILITY:  [facility]   PHYSICIAN:  Darrold Span. Florene Glen, M.D.  DATE OF BIRTH:  10-Dec-1950   DATE OF ADMISSION:  11/20/2006  DATE OF DISCHARGE:                              HISTORY & PHYSICAL   REASON FOR ADMISSION:  Fever.   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  Patient is followed by Dr.  Lorrene Reid in the office and in preparation of impending hemodialysis, the  patient underwent right radial A-V fistula creation, November 16, 2006, by  Dr. Curt Jews.  He tolerated that procedure well and was in his usual  state of health until the night of January 10, when he felt acutely ill  with fever, chills, nausea, vomiting and general malaise.  He said he  felt especially short of breath and self-medicated himself with  increased Lasix dosing, of which he took 240 mg yesterday.  He was  worried he might be fluid-overloaded.  He has also been taking Tylenol  as needed to keep his temperatures down.  His fever has gone as high as  105 at home.  Today, he calls.  He feels especially bad and presents to  the emergency room with a temperature of 102.7 and blood pressure of  140/90.   The patient was last admitted December 2007 with left lower leg  cellulitis and CHF secondary to fluid overload.  He diuresed using IV  diuretics and received antibiotics for his leg.  He completed a 2-week  course of Augmentin last week.  Other concerns have been of worsening  liver function and there has been discussion of  possibly getting on the  transplant list for kidney and liver transplant at [facility].   The patient currently denies upper respiratory symptoms, cough,  orthopnea, chest pain or abdominal pain.  He says his kidney transplant  does not hurt any more than usual and denies dysuria.  He has had no  swelling in his legs and that his cellulitis of his legs has resolved  completely.  He denies any open wounds, diarrhea or being around any  acutely ill people.   ALLERGIES:  NO KNOWN DRUG ALLERGIES.   CURRENT MEDICATIONS:  1. CellCept is 250 mg b.i.d.  2. Prednisone 5 mg daily.  3. Cyclosporin is 125 mg with breakfast.  4. Protonix 40 mg nightly.  5. Ferrous sulfate 325 mg daily.  6. Sodium bicarbonate 650 mg t.i.d.  7. Zoloft 100 mg daily.  8. Zocor 20 mg q.p.m.  9. Allopurinol 200 mg daily.  10.Lactulose 30 mL b.i.d.  11.Normodyne 300 mg b.i.d.  12.Furosemide 80 mg b.i.d.  13.Procrit 29,500 units subcutaneous every other week.  14.Valium 10 mg p.o. q.12 h. p.r.n. anxiety.  15.Calcitriol  0.25 mcg p.o. daily.  16.Status post recent Augmentin 500 mg b.i.d.   PAST MEDICAL HISTORY:  1. 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  2. Stage IV chronic kidney disease, baseline creatinine 4.1 since      December 2007.  3. Hypertension.  4. Coronary artery disease, status post CABG in 1992, with most recent      MI, 1998, followed by Dr. Glade Lloyd.  The patient had a negative      stress test, November 2007.  5. Hypercholesterolemia.  6. Cryptogenic cirrhosis, followed by Dr. Richmond Campbell.  7. Gout.  8. Depression and anxiety.  9. Anemia of chronic disease.  10.Secondary hyperparathyroidism.  11.Left leg cellulitis, December 2007.  12.Chronic sleep disturbances was insomnia.  13.Nephrotic syndrome.  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  15.History of elevated ammonia levels.  [id number] of metabolic acidosis.  17.History of peritoneal dialysis and hemodialysis in the past.   FAMILY HISTORY:  Mother is alive with history of breast cancer and 8-  year survivor.  Father died at approximately age 66 from renal cell  carcinoma.  He has 1 brother alive and well.  No other family history of  kidney disease.   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  He is single without children and  denies tobacco, alcohol or drug use.   REVIEW OF SYSTEMS:  Positive for weight loss, general malaise, worsening  appetite, chronic sleep disturbances.  HEENT:  He denies earaches, sore  throat, nasal congestion, auditory or visual disturbances.  He does  complain of a waxy film on the roof of his mouth.  LUNGS: No cough,  hemoptysis, orthopnea, PND or wheezing.  CARDIAC:  No chest pain or  palpitations.  GU:  No dysuria, pyuria, hematuria, frequency or  retention.   PHYSICAL EXAM:  VITAL SIGNS:  On admission, blood pressure 140/90,  temperature is 102.7, respirations are 20, pulse is 102, oxygen  saturation 96% on room air.  GENERAL:  A well-developed, well-nourished white male who is anxious,  but otherwise in no acute distress.  He is awake, alert, appropriate,  oriented x3.  SKIN:  Especially warm, but dry and without open wounds.  No purpura,  erythema or other rashes.  HEENT:  Normocephalic, atraumatic.  Pupils are equal, round and reactive  to light.  Sclerae are positive for icterus.  Tympanic membranes and  posterior pharynx are clear.  NECK:  Supple.  There is no palpable adenopathy and no thyromegaly.  LUNGS:  Grossly clear.  HEART:  Borderline tachycardiac and regular.  No rub or murmur audible.  ABDOMEN:  Protuberant, positive bowel sounds, soft, nontender, possibly ascites present.  The liver descends approximately 4 cm below  the right  costal margin and is nontender.  The left lower quadrant allograft is  nontender.  EXTREMITIES:  Pulses 2+ globally without edema or erythema.  The right  radial AV fistula has a positive bruit.  The wound at the right wrist is  intact, clean and dry with mild local erythema.  NEUROLOGIC:  Grossly intact and nonfocal.   LABORATORY DATA:  White count 8800 with 83% segs, 9% lymphocytes, 7%  monocytes, 1% eosinophil.  Hemoglobin 11.3, platelets are 60,000.  Sodium 136, potassium 3.3, chloride 105, CO2  22, BUN 59, creatinine  4.57, glucose 107, SGOT 32, SGPT 22, total bilirubin elevated at 6.8,  previously 1.6 in December 2007.  Albumin is 2.8, calcium 9.2.  Urinalysis is positive for bilirubin, blood and protein.  There are  trace leukocytes, 0-2 white blood cells and 7-10 red blood cells per  high power field.   RADIOLOGIC FINDINGS:  Chest x-ray:  No active disease or congestive  heart failure, but cardiomegaly and emphysematous changes are present.   ASSESSMENT AND PLAN:  1. Febrile illness in an immunocompromised cadaveric renal transplant      patient.  We will panculture the patient and use empiric vancomycin      and Avelox and stress steroids pending culture results.  2. Cryptogenic cirrhosis with worsening liver function as evidenced by      elevated total bilirubin.  We will check ammonia level, continue      lactulose b.i.d., follow his liver enzymes and consult Dr. Earlean Shawl      if necessary.  3. Stage IV chronic kidney disease of the cadaveric renal transplant.      Creatinine is elevated slightly from his baseline of 4.1 with his      recent history of increased Lasix dosing yesterday.  We will hold      his diuretics for now, since he has no evidence of excess fluid.      Continue his cyclosporin and CellCept as at home and use stress      steroids.  4. Status post new right arteriovenous fistula creation, Dr. Curt Jews, November 16, 2006.  5. Hypertension.   We will continue Normodyne as long as systolic blood      pressure remains greater than 110.  6. Iron-deficiency anemia.  Continue every-other-week Procrit at      29,500 units and daily ferrous sulfate.  His hemoglobin is elevated      since last checked earlier this week and last month.  7. Secondary hyperparathyroidism.  Continue vitamin D.  Follow up      phosphorus level in the morning.  8. Anxiety and depression.  We will continue as-needed Valium as at      home.  9. Coronary artery disease, coronary artery bypass graft and history      of myocardial infarction, stable, followed by Dr. Glade Lloyd.  10.Recent left lower leg cellulitis, status post Augmentin.      Nonah Mattes, P.A.      Darrold Span. Florene Glen, M.D.  Electronically Signed    RRK/MEDQ  D:  11/20/2006  T:  11/21/2006  Job:  [id number]   cc:   Mayme Genta, M.D.

## 2011-03-26 NOTE — H&P (Signed)
[place]  Patient:    [name], [name]                   MRN: [id number] Adm. 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 He has other problems that include coronary disease, is status post CABG and status post an MI in the past.  He called on the morning of the 13th to report three days of fever to as high as 101 and a single episode of diarrhea.  He had no other symptoms at that time including headaches, stiff neck, sore throat, cough, chest pains, shortness of breath, abdominal pain, dysuria, or rash, although he had two pimples on his face one on his chin and one on his forehead that he thought were acne.  He canceled his office appointment for the 13th, but did agree to go to [facility] or laboratory which revealed a white count of 4000 with a left shift, a low platelet count at 47,000 usually around 100,000, a creatinine of 2.3 which was baseline,  normal liver functions, and a negative chest x-ray.  Blood and urine cultures were sent.  He agreed to be seen on the 14th and on arrival in our office reported the onset the evening before of development of vesicles on his arms, trunk, and in increasing numbers on his face.  Of note, is the fact that he states he has never had chicken pox and his only exposures recently has been to horses and to dogs.  Another new complaint at the time of evaluation in the office was some mild dyspnea.  PAST MEDICAL HISTORY:  ESRD secondary to membranous nephropathy, history of failed peritoneal dialysis secondary to recurrent staff infections, and a history of hemodialysis.  He was transplanted in 1997 and  had allograft dysfunction as well as CMV disease posttransplant.  Has a history of CAD status post CABG in 1992 and status post MI with LV dysfunction in 1998.  He has a history of severe hyperlipidemia which caused his premature atherosclerosis, hypertension controlled on medicines, history of basal cell skin cancer removal, and a history of anxiety and depression.  MEDICATIONS: 1. Lasix 80 b.i.d. 2. Normodyne 200 in the morning, 100 midday, 200 at night. 3. Prednisone 5 a day. 4. CellCept 750 b.i.d. 5. Neoral 150 b.i.d. 6. Zocor 20 a day. 7. Zoloft 75 a day. 8. Aspirin 325 a day. 9. Valium 10 mg q.h.s. p.r.n. for sleep.  PHYSICAL EXAMINATION:  GENERAL:  He was acutely ill-appearing.  VITAL SIGNS:  Temperature 102.  HEENT:  He had vesicles all over his entire body including his face and in his oropharynx, hard and soft palate, chest, arms, and trunk.  Conjunctivae were markedly injected.  NECK:  There was no definite adenopathy.  LUNGS:  Clear to auscultation.  CARDIAC:  S1, S2.  No S3.  Soft S4.  There was a 1/6 murmur at the apex, but no  diastolic murmur and no rub.  ABDOMEN:  Renal allograft was nontender.  Bowel sounds were present.  There were no masses.  EXTREMITIES:  He had no edema of the lower extremity.  LABORATORIES:  Pending.  Please see the history for laboratory obtained at [facility].  PROBLEMS: 1. Renal transplant patient with a three day febrile illness and the development of    a diffuse vesicular rash over the past 12 hours, all compatible with a viral    infection.  He may well have varicella in the absence of a prior history of    chicken pox but obviously it could be anything at this point.  Plan would    include an ID consult (Dr. Orene Desanctis has been notified), respiratory isolation,    cultures, varicella antibody titer, herpes cultures of his vesicle fluid, IV    fluids because of poor intake, follow-up chest x-ray to rule out  development of    an infiltrate.  Will hold his aspirin and place him on bleeding precautions    secondary to his low platelets and hold Lasix since he will be getting some V    fluids. 2. 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 3. Coronary artery disease with a history of CABG and a history of myocardial    infarction-stable at present.  Dr. Glade Lloyd is his cardiologist. 4. Hypertension.  Will continue labetalol, but hold diuretics. 5. Other problems as outlined. DD:  01/21/00 TD:  01/21/00 Job: [id number]

## 2011-03-26 NOTE — H&P (Signed)
NAMECURTEZ, HUBLEY NO.:  [id number]   MEDICAL RECORD NO.:  [id number]          PATIENT TYPE:  INP   LOCATION:  5525                         FACILITY:  [facility]   PHYSICIAN:  Caren Griffins B. Lorrene Reid, M.D.DATE OF BIRTH:  May 05, 1951   DATE OF ADMISSION:  12/06/2006  DATE OF DISCHARGE:                              HISTORY & PHYSICAL   CHIEF COMPLAINT:  Worsening liver function tests with nausea and  vomiting.   HISTORY OF PRESENT ILLNESS:  [name] is a 60 year old white male  with history of renal transplant with chronic kidney disease with a  baseline creatinine in the mid 4s, cryptogenic cirrhosis, who was just  discharged November 24, 2006, after an Enterobacter bacteremia that was  treated with Cipro, associated with jaundice, increased bilirubin to  6.5, which was decreased back to 1.1 last week with normal liver  function tests.  Approximately 2 days ago, the patient had recurrence of  nausea and vomiting and generalized malaise.  He had labs done the  evening of the 28th at [facility] and his bilirubin was down to  be elevated again at 4.9. His SGOT was 115, SGPT 113, alkaline  phosphatase 499.  Dr. Lorrene Reid attempted to reach him by phone on the 28th  to arrange admission; however, he did not answer his cell phone.  Today  he is examined in the office and will be admitted for further evaluation  and workup.  He said he finished his Cipro January 28, but thinks he  still has a fever, but has not checked his temperature.  He denies  abdominal pain.   PAST MEDICAL HISTORY:  1. History of end-stage renal disease secondary to membranous      glomerulonephritis.  2. Renal transplant with progressive kidney disease status post AV      fistula placement right wrist November 18, 2006.  3. Dyslipidemia.  4. Cryptogenic cirrhosis followed by Dr. Earlean Shawl.  5. Anemia on Procrit.  6. Hypertension.  7. 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  8.  Depression.  9. Gout.  10.History of left leg cellulitis December 2007, secondary to      hyperparathyroidism.   CURRENT MEDICATIONS:  1. Prednisone 5 mg per day.  2. Cellcept 250 mg b.i.d.  3. Cyclosporin 125 mg at bedtime.  4. Zoloft 100 mg per day.  5. Allopurinol 200 mg per day.  6. Calcitriol 0.25 mg daily.  7. Lasix 80 mg 2 to 4 times per day.  8. Ferrous sulfate 325 mg per day.  9. Labetalol 300 mg 3 times a day.  10.Protonix 40 mg at bedtime.  11.Sodium bicarbonate 650 mg 3 times a day.  12.Procrit 20,000 units per week.  13.Zocor 20 mg per day.   FAMILY HISTORY:  Noncontributory.   SOCIAL HISTORY:  No alcohol, no tobacco.  Lives alone; sister brought  him to [employer] office today.   REVIEW OF SYSTEMS:  No headache.  Positive chills, but no significant  fever.  Positive nausea and vomiting since Saturday, unable to keep down  antibiotics and transplant meds.  No significant abdominal pain, except  on  left side.  Worsening fatigue and shortness of breath.  He says he  can sleep on 2 pillows if he is relaxed, but he has chronic insomnia.  Sleeps during the day.  Appetite is diminished.  No chest pain, but  dyspnea on exertion. No diarrhea or constipation he says because he is  not eating enough.  Left lower extremity itches at times at the site of  previous cellulitis.  Chronic left lower leg swelling.  No seizures, no  myoclonus.  No joint or muscle pain.  No urinary symptoms, except urine  is darker than usual.   PHYSICAL EXAMINATION:  VITAL SIGNS:  Temperature 98.8, blood pressure  180/90 left arm supine, pulse 80, weight 178 pounds.  GENERAL:  Fair skin, ruddy complected, white male, chronically ill  keeping eyes closed most of the time, but in no acute distress.  HEENT:  Normocephalic, atraumatic.  EOMs intact.  TMs clear bilaterally.  Oropharynx increase injection posteriorly.  Questionable hemangioma at  the top of upper pallet.  Sclerae  grayish with slight icterus.  NECK:  Neck veins slight prominent.  HEART:  Regular rate and rhythm with 1-2/6 murmur.  No rub.  ABDOMEN:  Positive bowel sounds. Mild left-sided tenderness.  No  rebound, no guarding. Multiple scars.  Prominent caput medusa, positive  ascites with fluid wave.  LUNGS:  Bilateral dry crackles approximately half way up with decreased  breath sounds at bases.  EXTREMITIES:  A 1+ left lower extremity edema.  No evidence of  cellulitis.  Right lower extremity no edema.  Feet in good repair.  Right wrist AV fistula is strong thrill.  NEURO:  Alert and oriented x3.  Moves all extremities well.  No  asterixis.  Mild bilateral hand tremor.  SKIN:  Fair.  No rashes.  No new bruising.   ASSESSMENT:  Cryptogenic cirrhosis with worsening nausea, vomiting, and  sudden elevation of liver function tests in setting of recent gram-  negative rod bacteremia, which a source could not be identified.   PLAN:  1. He will be admitted.  Placed on gentle IV fluids, made n.p.o. given      IV Phenergan for nausea and attempt to give him his medications.      If he is unable to take them, we will need to change the route of      administration.  He will be n.p.o. until evaluated by Dr. Earlean Shawl, a      GI consult has been ordered.  We will continue empiric IV Cipro      again after he has blood and urine cultures.  The patient is to      have a CT of the abdomen without IV contrast to evaluate for      worsening liver function and recent unexplained Enterobacter      bacteremia.  2. Renal transplant with progressive CKD.  He does have a fistula in      place.  We will give him stress steroids and continue his Cellcept,      cyclosporin, and gentle IV rehydration.  3. Anemia.  Continue weekly Procrit and daily ferrous sulfate for      hypertension.  He is most likely hypertensive today because he has     not received any blood pressure meds recently.  Hold Lasix for now      until  after chest x-ray and hold blood pressure if his systolic      should drop less than 120.  4. Gout.  Continue allopurinol.  5. Secondary hyperparathyroidism.  Continue low dose calcitriol.  6. Coronary artery disease with history of CABG, stable.  No evidence      of chest pain at this time.  7. Hyperlipidemia.  Continue Zocor.      Alric Seton, P.A.    ______________________________  Elzie Rings Lorrene Reid, M.D.    MB/MEDQ  D:  12/06/2006  T:  12/07/2006  Job:  [id number]   cc:   Mayme Genta, M.D.  Lucretia Roers

## 2011-03-26 NOTE — Discharge Summary (Signed)
[name], [name] NO.:  [id number]   MEDICAL RECORD NO.:  [id number]          PATIENT TYPE:  INP   LOCATION:  5507                         FACILITY:  [facility]   PHYSICIAN:  Kaylyn Layer, M.D.  DATE OF BIRTH:  November 22, 1950   DATE OF ADMISSION:  02/25/2007  DATE OF DISCHARGE:                               DISCHARGE SUMMARY   DISCHARGE DIAGNOSES:  1. Fever of unknown origin.  2. End-stage renal disease on hemodialysis secondary to failed renal      transplant about 3 months ago.  3. Cryptogenic cirrhosis.  4. Coronary artery disease status post coronary artery bypass graft.  5. Portal hypertension.  6. Anemia of chronic kidney disease.  7. Secondary hyperparathyroidism.  8. Depression and anxiety.  9. Chronic thrombocytopenia.   DISCHARGE MEDICATIONS:  1. Doxycycline 100 mg p.o. b.i.d. x7 days.  2. Cyclosporin 100 mg p.o. daily.  3. Prednisone 5 mg p.o. daily.  4. Zocor 20 mg p.o. daily.  5. Zoloft 100 mg p.o. daily.  6. Nephrovite 1 tablet p.o. daily.  7. PhosLo 667 mg 2 tablets with meals.  8. Prilosec 20 mg p.o. daily.  9. Allopurinol 200 mg p.o. daily.  10.Iron sulfate 325 mg 2 tablets p.o. daily.  11.The patient is to stop taking labetalol secondary to low blood      pressures until instructed to restart it.   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  The patient had recently finished a  course of vancomycin for an exit wound infection.  The patient was  otherwise asymptomatic without any cough.  No signs of infection near  his right IJ.  She did note that he had a tick found on him a few days  prior to admission.   IMAGING:  The patient had a chest x-ray with no active disease.   HOSPITAL COURSE:  By problem:  1. Fever.  Blood cultures were drawn  on April 19, no growth.  At the      day of discharge, urine culture was pending at the time of      discharge as was Borrelia burgdorferi culture.  After his initial      presentation, the patient was afebrile for the remainder of his      hospitalization.  White blood count remained within normal limits.      Still no signs or symptoms of infection.  The patient was      originally on vancomycin and Fortabs.  We switched these to p.o.      antibiotics at the day of discharge.  He is to continue 7 days of      doxycycline.  We have asked his dialysis center to draw a CMV PCR      titer as well at his dialysis tomorrow.  2. End-stage renal disease.  The patient is due for dialysis tomorrow.      His center has been  notified.  There have been no changes to his      regimen.  3. Coronary artery disease.  We kept the patient on Zocor during his      hospitalization here.  4. Portal hypertension.  We held the Labetalol since he had systolic      blood pressures in the 80s on admission.  This should be restarted      at the discretion of his outpatient doctor.  5. Anemia of chronic kidney disease.  Continued p.o. iron.  The      patient will have IV iron on hemodialysis tomorrow.  Hemoglobin      remained stable.   DISCHARGE INSTRUCTIONS:  The patient is to adhere to a renal diet and  limit fluids to 1.2 liters daily.   ACTIVITY:  As tolerated.   GOAL WEIGHT:  77 kilograms.   FOLLOWUP:  He will be followed up at the dialysis center.      Kaylyn Layer, M.D.     KS/MEDQ  D:  02/27/2007  T:  02/27/2007  Job:  [id number]

## 2011-03-26 NOTE — Consult Note (Signed)
[name], [name] NO.:  [id number]   MEDICAL RECORD NO.:  [id number]          PATIENT TYPE:  INP   LOCATION:  5522                         FACILITY:  [facility]   PHYSICIAN:  Mayme Genta, M.D.DATE OF BIRTH:  08-11-1951   DATE OF CONSULTATION:  10/26/2006  DATE OF DISCHARGE:                                 CONSULTATION   GASTROENTEROLOGY CONSULTATION NOTE   REASON FOR CONSULTATION:  Patient is a 60 year old white male whom I was  asked to see by Dr. Donato Heinz to evaluate occult GI blood loss.   HISTORY OF PRESENT ILLNESS:  Mr. Bestul underwent extensive GI  evaluation by me in 2004.  He was found to have cryptogenic cirrhosis at  the time of attempted cholecystectomy.  The procedure was aborted and  further evaluation was undertaken.  No etiology was diagnosed.  He  subsequently successfully completed cholecystectomy performed at [facility].  Patient has not been seen by me for the past 3-1/2 years.   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  Over the past 10 years, he has had gradual deterioration of his  Allograft that continues to serve him with moderately elevated  creatinine.  Repeat renal transplantation is being considered.  His  present hospitalization was precipitated by recurrent, acute left lower  extremity cellulitis refractory to Keflex.  Patient was admitted  October 23, 2006 and antibiotic therapy was initiated with Zosyn.  Patient is immunocompromised on chronic CellCept and cyclosporin for  immunosuppression of his transplanted graft.   Since admission, the patient was found to be hemoccult positive.  He has  an ongoing anemia with hemoglobin ranging in the 9.0-11.0 ranged.  Maintained on Procrit every 2  weeks.  Iron studies have been normal with  transferring saturation in the 20-25% range.   Patient's GI symptoms are minimal, occasional pyrosis.  No hematemesis.  No regurgitation.  No odynophagia or dysphagia.  Denies dyspepsia.  Abdominal pain related to cyclosporine infusion, ___________by evening  prior to cyclosporin dosing.  The patient has a relatively good appetite  and weight has been stable.  The patient has chronic diarrhea, which is  exacerbated with use of Lactulose.  No melena or hematochezia.  Colonoscopy was performed by me approximately 3-1/2 years ago and was  found to be normal.  Pandoscopy at that time, did not reveal esophageal  varices, chronic gastritis, ___________negative, was diagnosed.   The patient is not maintained on PPI therapy.  He is not on aspirin or  NSAIDs.   His other GI issue is one of end-stage liver disease.  At the time of  anticipated elective cholecystectomy to have a shrunken nodular liver  consistent with cirrhosis biopsy revealed a bridging fibrosis.  Extensive workup was negative for a viral, metabolic, congenital,  autoimmune, or other etiology.  He has not suffered with significant  complications from his liver disease, though he does have chronic  encephalopathy with hypersomnolence  and elevated venous ammonia  responsive to Lactulose, thrombocytopenia, and increased abdominal fluid  collection.  CT has confirmed findings of intraabdominal varices,  splenomegaly, nodular liver.   PAST MEDICAL HISTORY:  1. Hypertension.  2. CAD - S/P CABG 1992, MI 1998.  3. Hyperlipidemia.  4. Gout.  5. Depression.  6. Anemia.  7. Hyperparathyroidism - secondary.  8. Chronic insomnia.   MEDICAL REGIMEN PRIOR TO ADMISSION:  1. Cyclosporin 125 mg q.a.m., 100 mg every evening.  2. Normodyne 300 mg b.i.d.  3. CellCept 250 mg b.i.d.  4. Lasix 80 mg daily.  5. Prednisone 5 mg daily.  6. Zocor 20 mg daily.  7. Valium 10 mg p.r.n.  8. Allopurinol  200 mg daily.  9. Zoloft 100 mg daily.  10.Lactulose 30 mg b.i.d.  11.Sodium bicarbonate 650 mg b.i.d.  12.Calcitriol 0.25 mcg daily.  13.Iron sulfate daily.  14.Micardis 40 mg daily.  15.Procrit 20,000 units q.2 weeks subcu.  16.EpiPen p.r.n.   FAMILY HISTORY:  Patient is single.  Mother, brother are living.   SOCIAL HISTORY:  Patient lives alone in [place].  Recently retired from  a [employer].  Single.  No children.  No tobacco.  No alcohol.  No  drug use.   REVIEW OF SYSTEMS:  Chronic fatigue, decreased appetite with recent  illness.  Dyspnea on exertion.  Abdominal pain associated with  cyclosporin.  Pyrosis.  Diarrhea.  Extremities with heat, tenderness,  swelling in the left lower extremity.   PHYSICAL EXAMINATION:  GENERAL:  Chronically ill-appearing white male,  alert and oriented, comfortable, conversational, resting in bed.  HEENT:  Right facial malar skin graft from prior port wine stain  resection with subsequent skin graft.  EOMI.  PERRL.  Marked  conjunctival pallor.  No icterus.  Mouth:  No oropharyngeal lesion.  NECK:  Supple.  No adenopathy.  No thyromegaly.  No bruit.  CHEST:  No adventitious sounds.  Clear to auscultation.  No  gynecomatastia.  CARDIAC:  Regular rhythm.  A 2/6 systolic ejection murmur along the  lower left sternal border.  No lift or heave.  ABDOMEN:  Overweight, fluid wave present caput with prominent venous  pattern.  Liver edge is not palpable.  No splenomegaly.  Nontender.  Bowel sounds active.  No bruit.  No splash.  Fluid wave detectable.  RECTAL:  Not performed.  EXTREMITIES:  Left lower extremity increased erythema to just below the  knee.  Nontender.  Trace pitting edema.   LABORATORY DATA:  Reviewed.   ASSESSMENT:  1. Occult gastrointestinal blood loss - this is documented with      hemoccult positive stool, chronic anemia.  Multiple potential     etiologies, including anemia of chronic disease, including chronic      renal  insufficiency with cadaveric transplant.  Gastrointestinal      sources would include esophageal varices, portal hypertensive      gastropathy, peptic disease, gastroesophageal reflux disease.      Lower gastrointestinal sources also to be considered, including a      rectal sources, e.g. hemorrhoids, rectal varices, anorectal      fissures, or ___________aplasia.  The latter is doubtful given      prior colonoscopy 3-1/2 years ago without finding any neoplasia.      No suggestion of acute gastrointestinal blood loss.  Further      evaluation warranted to ascertain the source of occult      gastrointestinal blood loss, also as a pretransplant workup to  rule      out underlying neoplasia prior to considering possibly liver      transplantation.  2. Cirrhosis - cryptogenic, end-stage, presently well compensated.      The patient's associated comorbidities include chronic      encephalopathy, thrombocytopenia, portal hypertension.  I do not      think this patient should be considered for renal transplant alone.      If you were to have a successful renal transplant, it would be a      short period of time until he suffered hepatic decompensation.  I      think that if transplantation is to be offered, the patient needs      to be considered for simultaneous transplantation of both the liver      and kidney.   RECOMMENDATIONS:  1. Panendoscopy.  2. Colonoscopy - both to be performed tomorrow, October 27, 2006.  3. Capsule endoscopy - to be considered pending results of above      endoscopic procedures.  4. Consider combined transplant of kidney and liver.      Mayme Genta, M.D.  Electronically Signed     JRM/MEDQ  D:  10/26/2006  T:  10/27/2006  Job:  [id number]   cc:   Donato Heinz, M.D.

## 2011-03-26 NOTE — Discharge Summary (Signed)
[name], BRONIKOWSKI NO.:  [id number]   MEDICAL RECORD NO.:  [id number]          PATIENT TYPE:  INP   LOCATION:  5522                         FACILITY:  [facility]   PHYSICIAN:  Donato Heinz, M.D.DATE OF BIRTH:  02/20/1951   DATE OF ADMISSION:  10/23/2006  DATE OF DISCHARGE:  10/28/2006                               DISCHARGE SUMMARY   DISCHARGE DIAGNOSES:  1. Left lower extremity cellulitis.  2. Congestive heart failure.  3. Duodenitis.  4. Resolved acute renal failure with chronic kidney disease stage IV,      status post renal transplant.  5. Cryptogenic cirrhosis.  6. Anemia.  7. Secondary hypoparathyroidism.  8. Thrombocytopenia with neutropenia.   PROCEDURES:  1. October 25, 2006, noninvasive peripheral venous study.      Impression:  No evidence of DVT, superficial thrombosis or Bakers      cyst.  2. October 25, 2006, abdominal ultrasound.  Impression:  1.  Prior      cholecystectomy.  Mild biliary dilatation with common bile duct      measuring 12 mm.  2.  Hepatic cirrhosis with portal venous      hypertension and recannulization of periumbilical veins.  3.      Moderate splenomegaly with 1.8 cm hyperechoic mass within the      spleen.  This is nonspecific, but likely represents a small      hemangioma.  4.  Small echogenic native kidneys with small      bilateral cysts, consistent with chronic renal failure.  3. October 25, 2006, renal transplant ultrasound.  Impression:      Normal size and appearance of renal transplant and left iliac      fossa.  No evidence for hydronephrosis.  4. October 27, 2006, EGD by Dr. Richmond Campbell.  Assessment:      Duodenitis without hemorrhage.  Plan:  Continue current medication      regimen of Protonix daily for 4 weeks.  5. October 27, 2006, colonoscopy by Dr. Richmond Campbell.  Assessment:      Normal examination.   CONSULTATIONS:  Dr. Richmond Campbell.   HISTORY OF PRESENT ILLNESS:  Mr. Skillin is a  60 year old white male  with multiple medical problems most notable for end-stage renal disease  secondary to membranous DN, status post cadaveric renal transplant in  1997 with chronic kidney disease stage IV, coronary artery disease,  status post bypass graft surgery and a history of an MI and cryptogenic  cirrhosis who presents with a 4 day history of increasing left lower  extremity erythema and pain.  The patient reports this started back in  August of 2007.  He had been treated with Keflex at that time and  resolved.  It has since recurred.  He also has had a 2 day history of  increasing shortness of breath and orthopnea.  Patient is unable to lie  flat due to his shortness of breath.  He said this did not respond to an  extra dose of Lasix and was instructed to come to the emergency room for  further evaluation.   ADMISSION LABORATORY DATA:  Sodium 138, potassium 4.3, chloride 114, CO2  15, glucose 97, BUN 58, creatinine 3.8, total bilirubin 1.2, alkaline  phosphatase 162, SGOT 22, SGPT 19, total protein 6.9, albumin 3.1 and  calcium 9.5.  WBC 8.6, hemoglobin 10.4, hematocrit 31.0, platelets 61.  CK-MB 3.1, troponin 0.04.  Urine culture negative.  No growth.  Blood  culture is negative to date.   DIAGNOSTIC RADIOLOGICAL EXAMINATION:  October 23, 2006, 2-view chest x-  ray.  Impression:  Low volumes with crowning of the bronchovascular  structures and vascular congestion.   HOSPITAL COURSE:  1. Left lower extremity cellulitis.  The patient was admitted to the      hospital.  The patient was pan cultured with results as stated      above.  He was initially started on IV antibiotic therapy.      However, during the course of his hospitalization, he was changed      to oral antibiotics.  A Doppler study was performed, which was      negative for DVT thrombosis or Bakers cyst.  At the time of      discharge, the patient tolerated his antibiotics well without      difficulty and  the lower extremity cellulitis had improved.  2. Congestive heart failure.  The patient was started on IV diuretic      therapy twice daily.  Upon admission a chest x-ray was performed      with results as stated above.  During the patient's      hospitalization, he did have an increase in his serum creatinine      and as a result the diuretic therapy was decreased.  By the date of      discharge, the patient's symptoms had dramatically improved and he      will continue on low dose diuretic therapy as an outpatient.  3. Duodenitis.  During the patient's hospitalization, it was noted      that his hemoglobin was decreasing.  As a result, his Epogen      therapy was increased and stools were guaiac.  One stool was guaiac      positive.  GI was consulted at that time and the patient underwent      an EGD and colonoscopy with results as stated above.  Proton pump      inhibitor therapy was continued for the remainder of his      hospitalization and on the day of discharge, the patient's      hemoglobin was 8.8 and hematocrit 26.2.  4. Acute renal failure with chronic kidney disease stage IV, status      post renal transplant.  During the patient's hospitalization, his      labs were monitored closely.  He did begin to have a climb in his      serum creatinine and as a result, his Avapro therapy was      discontinued and Lasix therapy was decreased.  An abdominal      ultrasound was performed to rule out obstruction with results as      stated above.  On the day of discharge, the patient's serum      creatinine is 5.8 and BUN 66.  He was without evidence of uremia at      this time and renal function will be followed up as an outpatient.  5. Cryptogenic cirrhosis.  Patient's lactulose therapy was continued      throughout the hospitalization per his  preadmission regimen, which      he tolerated well and without difficulty. 6. Anemia.  The patient's Epogen and iron therapy was continued       initially per his preadmission regimen.  He did have decreased iron      stores and received an IV iron bolus at that time.  Please refer to      number 3 for further information.  7. Secondary hyperparathyroidism.  The patient was continued on his      oral Calcitriol per his preadmission regimen, which he tolerated      well and without difficulty.  On the day of discharge, patient's      calcium was 9.3 and phosphorus 6.2.  8. Thrombocytopenia with neutropenia.  During the patient's      hospitalization, he did have a slight decline in his platelet      count, which is chronic for the patient.  On the day of discharge,      patient's platelet count is 49.  The patient also had a gradual      decline in his white blood cell count as well and on the day of      discharge, the patient's white blood cell count is 2.4.  This will      be followed up Monday, when the patient will have labs drawn at      [facility] and results will be faxed to [facility].   DISCHARGE MEDICATIONS:  1. Augmentin 500 mg 1 p.o. b.i.d. x2 weeks.  2. Labetalol 300 mg 1 p.o. b.i.d.  3. Prednisone 5 mg 1 p.o. daily.  4. Zoloft 100 mg 1 p.o. daily.  5. Zocor 20 mg 1 p.o. daily.  6. CellCept 250 mg 1 p.o. daily.  7. Lactulose 30 mL p.o. b.i.d.  8. Allopurinol 100 mg daily.  9. Sodium bicarb 650 mg 1 p.o. t.i.d.  10.Ferrous sulfate 325 mg 1 p.o. daily.  11.Calcitriol 0.25 mcg 1 p.o. daily.  12.Cyclosporin 125 mg 1 p.o. q.a.m. and 100 mg q.h.s.  13.Lasix 40 mg daily.  14.Protonix 40 mg daily.  15.Valium 10 mg daily p.r.n.  16.EpiPen p.r.n.   DISCHARGE INSTRUCTIONS:  Patient is instructed to discontinue Micardis  at this time.  Activity is as tolerated.  The patient will report to  [facility], Monday, December 24, for followup CBC and BMP at that  time.  Results will be faxed to [facility] and the  patient will receive a prescription for the lab work at the  time of  discharge.  Followup appointment with [facility] will  also be scheduled.      Tracey P. Sherrod, NP    ______________________________  Donato Heinz, M.D.    TPS/MEDQ  D:  10/28/2006  T:  10/29/2006  Job:  [id number]   cc:   [facility]  Mayme Genta, M.D.

## 2011-03-26 NOTE — Consult Note (Signed)
NAMEJOURDAIN, VIELMAN NO.:  [id number]   MEDICAL RECORD NO.:  [id number]          PATIENT TYPE:  INP   LOCATION:  [id number]                         FACILITY:  [facility]   PHYSICIAN:  Mayme Genta, M.D.DATE OF BIRTH:  1951/04/29   DATE OF CONSULTATION:  12/06/2006  DATE OF DISCHARGE:                                 CONSULTATION   REASON FOR CONSULTATION:  A 60 year old white male admitted with  cholestatic liver injury.   HISTORY OF PRESENT ILLNESS:  The patient is a 60 year old white male  well-known to me from prior admissions, recent consultation one month  ago when he was hospitalized with cellulitis.  At that time with  pancytopenia, which was evaluated by me with endoscopy and colonoscopy.  No significant pathology was identified.   The patient underwent a right AV fistula November 16, 2006 for his chronic  renal failure with anticipated dialysis.  He was readmitted four days  later, November 20, 2006, with Enterobacter bacteremia.  The patient was  treated over the next four days with Cipro in addition to his chronic  medications and was discharged 12 days ago, November 24, 2006.   The patient has been closely followed by Dr. Jamal Maes on an  outpatient basis.  Two days ago, began complaining of nausea and  vomiting, general malaise.  Labs revealed cholestatic liver chemistries  with total bilirubin 4.9, alkaline phosphatase 499 with moderate  transaminitis including AST 115, ALT 113.  The patient finished his  Cipro yesterday.   Presently complaining of nausea and vomiting, feeling feverish but  without discrete rigor, chills or diaphoresis.  Appetite is poor and he  feels lethargic.  Denies abdominal or flank pain.  He has noted  progressive yellowing of the skin over the past several days along with  dark urine.  Denies shortness of breath or peripheral swelling, no  excess bleeding or bruising.   PAST MEDICAL HISTORY:  1. 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  2. Chronic renal failure - anticipating resumption of dialysis with      recent AV fistula creation.  3. Cryptogenic cirrhosis.  4. Hypertension.  5. Coronary artery disease; S/P CABG 1992, MI 1998.  6. Hypercholesterolemia.  7. Gout.  8. Depression/anxiety.  9. Anemia of chronic disease.  10.Secondary hyperparathyroidism.  11.Left leg cellulitis, December 2007.  12.Insomnia.  13.Hyperammonemia.  0000000 of metabolic acidosis.   SOCIAL HISTORY:  Disabled Engineer, manufacturing systems.  Single.  No children.  No  substance abuse.  Medically disabled.   FAMILY HISTORY:  Mother with breast cancer, living.  Father deceased at  2 of renal cell carcinoma.  One brother healthy.   REVIEW OF SYSTEMS:  Overall poor quality of life, weight loss, lethargy,  anorexia, poor sleep pattern.  HEENT:  Yellowing of the eyes.  Dry  mouth.  CHEST:  Denies orthopnea, PND, DOE.  No cough.  No shortness of  breath.  CARDIAC:  Denies chest pain, palpitations, syncope.  ABDOMEN:  No abdominal pain.  Nauseated.  Bowel habits normal.  GU:  No dysuria,  pyuria, nocturia.  MUSCULOSKELETAL:  Generalized weakness.  SKIN:  Jaundice.   PHYSICAL EXAMINATION:  GENERAL:  Acute and chronically ill white male,  alert and oriented.  Comfortable and conversational lying in bed, in no  acute distress.  VITAL SIGNS:  Stable.  Afebrile.  HEENT:  Icteric sclerae.  Dry mucous membranes.  EOMI.  PERRL.  No  oropharyngeal lesions.  Neck supple, no adenopathy or thyromegaly.  CHEST:  Bibasilar rales with decreased breath sounds at both basses.  CARDIAC:  Regular rhythm, 2/6 systolic ejection murmur along the LLSB.  No gallop appreciated.  ABDOMEN:  Active bowel sounds, nondistended.  Hep edge coarsely nodular,  4 cm below RCM, nontender.  Splenomegaly 8 cm LAAL, nontender.  Increased superficial venous patten over the anterior abdominal wall,  possible  fluid wave.  RECTAL:  Not performed.  EXTREMITIES:  1+ bilateral pretibial edema.   LABORATORY DATA:  Hemoglobin 10.0, hematocrit 30.7, WBC 4.2K, [place],  platelet 80K.  CMET abnormal creatinine 4.3, bilirubin 8.0, alkaline  phosphatase 478, AST 81, ALT 94, total protein 6.3, albumin 2.7.   ASSESSMENT:  A cholestatic liver injury - the initial determination  needs to be that of intrahepatic versus extrahepatic cholestasis.  A CT  was obtained earlier today shortly after admission, but these results  are not available.  Ductal dilation would indicate extrahepatic  obstruction.  Recall the patient is status post cholecystectomy for  stone disease.  Choledocholithiasis certainly needs to be considered.  The absence of abdominal pain weighs against this.  Malignant  extrahepatic obstruction is possible and needs to be considered.  If  there is evidence of ductal dilation, then proceeding to endoscopic  retrograde cholangiopancreatography with possible therapeutic  intervention will be recommended.   If there is no evidence of ductal dilation, then the most likely causes  of intrahepatic cholestasis would include sepsis, viral infection -  e.g., hepatitis A, hepatitis B, hepatitis C, Epstein-Barr virus,  cytomegalovirus; drug induced - possibly due to Cipro, although this  would be a rare cause; or autoimmune disease, which is doubtful  especially with the patient on immunosuppressive therapy with CellCept.  Further evaluation pending CT review.   RECOMMENDATIONS:  1. Review CT.  2. Proceed with ERCP and possible therapeutic intervention if evidence      of ductal dilation.  3. Further evaluation with blood cultures, viral serologies, and      withholding Cipro if cholestasis appears to be intrahepatic.      Mayme Genta, M.D.  Electronically Signed     JRM/MEDQ  D:  12/06/2006  T:  12/07/2006  Job:  [id number]   cc:   Elzie Rings. Lorrene Reid, M.D. Sherril Croon, M.D.

## 2011-03-26 NOTE — H&P (Signed)
NAME:  [name], [name] NO.:  [id number]   MEDICAL RECORD NO.:  [id number]          PATIENT TYPE:  INP   LOCATION:  5507                         FACILITY:  [facility]   PHYSICIAN:  Dionicio Stall, M.D.  DATE OF BIRTH:  [birth year]   DATE OF ADMISSION:  02/25/2007  DATE OF DISCHARGE:                              HISTORY & PHYSICAL   HISTORY AND PHYSICAL:  [name] is a 60 year old very pleasant white  man who has had multiple medical problems in the past including end  stage renal disease with a history of failed transplant, back on  hemodialysis since January 2008 via a right IGA Diatek catheter placed  in January 2008.  The patient has a right AV fistula placed in February  2008 which is not maturing.  He has cryptogenic sclerosis awaiting liver  transplant at [facility] and other medical problems as stated below.  He  apparently had redness of his exit site two weeks ago.  He was started  on vancomycin as wound cultures grew MRSA.  The patient was doing well  until Thursday, April, 17, 2008 at hemodialysis where he spiked a high  temperature and has had fever since.  Blood cultures were drawn at  hemodialysis today and [place] added.  No history of myalgia, nausea,  vomiting, abdominal pain, cough or rash.  The patient recently removed a  tick from his back while walking through the woods (2-3 days ago).   ALLERGIES:  Spider bite causes anaphylaxis.   PAST MEDICAL HISTORY:  1. End stage renal disease secondary to membranous glomerulonephritis      on hemodialysis Tuesday, Thursday and Saturdays which started in      January 2008.  The patient is status post renal transplant failure.      The patient has a history of PD/HD prior to transplant.  2. Cryptogenic cirrhosis awaiting liver transplant at [facility].  Work up      which has started in January 2008.  3. Coronary artery disease status post coronary artery bypass graft in      1992.  History of MI in 1998.  4. Stress  test by Dr. Glade Lloyd in November 2007.  5. Portal hypertension.  6. History of hypercholesterolemia.  7. History of gout.  8. History of lower extremity cellulitis in December 2007.  9. History of enterobacter bacteremia from December 2007 to January      2008.  10.History of MRSA bacteremia on February 08, 2007.  11.Anemia of chronic kidney disease.  12.Secondary hyperparathyroidism.  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  15.Sleep disturbance/insomnia.   HEMODIALYSIS TREATMENT:  The patient receives hemodialysis at [facility] on Tuesday, Thursday and Saturdays.  His estimated dry  weight is 77 kg.  Duration is four hours and access is right IHA  catheter which was placed in February 2008.  The patient has a right  Cimino AVS radial artery which was placed in January 2008.  The patient  is to receive 28,000 Epogen IV three times a week, Zemplar 4 mcg  IV  three times a week and Xanax 400 mg IV three times a week.   CURRENT MEDICATIONS:  1. Cyclosporin 100 mg p.o. daily.  2. Labetalol 150 mg p.o. q h.s.  3. Prednisone 5 mg p.o. daily.  4. Zocor 20 mg p.o. daily.  5. Zoloft 100 mg p.o. daily.  6. Nephrovite p.o. daily.  7. Omega 3 supplements.  8. FESO4 325 mg p.o. daily.  9. PhosLo 2.667 mg t.i.d. with meals.  10.Lactulose b.i.d. which was discontinued two months ago.  11.Prilosec 20 mg p.o. daily.  12.Vancomycin IV 1000 mg three times a week.  13.Fortis IV 2000 mg three times a week.  14.Allopurinol 100 mg two puffs daily.   SOCIAL HISTORY:  The patient lives in [place] alone.  He worked at a  [employer] but now he is disabled.  He is single, never been  married.  No history of alcohol, tobacco or drug abuse.  The patient's  education level is high school and some college.   FAMILY HISTORY:  The patient's mother is alive and she is a breast  cancer survivor.  The patient's father  died at 51 years from renal  carcinoma.  The patient has one brother who has coronary artery disease,  status post stents, hypertension and hyperlipidemia.  The patient  apparently has one cousin who has similar kidney disease as him.   REVIEW OF SYSTEMS:  The patient has had on and off fevers, mild 99-100  on hemodialysis days.  He has sweats but no frequency, urgency,  shortness of breath, edema, etc.   PHYSICAL EXAMINATION:  VITAL SIGNS:  Temperature 102.7, blood pressure  81/53, pulse 96, respiratory rate 18.  GENERAL APPEARANCE:  Not in acute distress.  HEENT:  Extraocular movements intact.  Pupils equal, round, reactive to  light.  Oropharynx without erythema or exudate.  No icterus.  NECK:  Supple.  No JVD.  No adenopathy.  HEART:  Regular rate and rhythm.  No murmurs, rubs or gallops.  LUNGS:  Air entry equal bilaterally but decreased at the bases.  Mild  bibasilar rales present.  SKIN:  No rashes.  ABDOMEN:  Soft, distended.  Signs of portal hypertension seen.  Bowel  sounds are present.  EXTREMITIES:  No edema, cyanosis or clubbing. Axis right IGA Diatek  cath.  No signs of infection.  NEUROLOGIC:  Alert and oriented times four.  Cranial nerves II-XII  within normal limits.  Reflexes 2+ bilaterally.  Strength 5/5  bilaterally.  Grossly intact.   ASSESSMENT/PLAN:  1. Fevers.  Differential diagnosis is broadened in an immuno      compromised individual.  This includes pneumonia, bacterial, viral      or fungal.  The patient gives no history of upper respiratory tract      infection or lower respiratory tract infection namely cough, versus      [facility] Spotted Fever with history of tick bite two days      prior to admission.  However, no rashes are noted and platelet      levels are still pending versus SBP, history of cirrhosis and      ascites not on prophylactic antibiotics versus Lyme infection but     catheter site does not look infected versus drug fever.   The      patient has been on vancomycin for the last two weeks.  The plan is      to admit to telemetry, check CBC with diff, check renal  functional      panel, blood cultures times two, urinalysis, liver function test.      Also to continue vancomycin and Tressie Ellis for now and await all labs.      We will check chest x-ray to rule out pneumonia and continue      cyclosporin.  I will also give IV fluids as blood pressure is soft,      gently.  2. Topical methicillin resistant staphylococcus aureus, positive exit      wound infection on vancomycin IV for the last two weeks.  Current      catheter does not appear infected.  Monitor fever curve.  3. End stage renal disease on hemodialysis since January 2008 status      post liver transplant failure.  The patient will be kept on      cyclosporin, Prednisone and dialysis continued according to regular      schedule.  4. Anemia of chronic kidney disease.  Monitor H&H and continue Epogen.  5. Hypertension.  We will hold off on antihypertensive medications      secondary to soft blood pressures now.  6. Gastroesophageal reflux disease.  Continue Prilosec, Protonix.  7. History of gout.  Continue allopurinol.  8. Hypercholesterolemia.  Continue Zocor.      Dionicio Stall, M.D.  Electronically Signed     SS/MEDQ  D:  02/25/2007  T:  02/26/2007  Job:  [id number]

## 2011-03-26 NOTE — Op Note (Signed)
NAMEBLU, HEWATT            ACCOUNT NO.:  [id number]   MEDICAL RECORD NO.:  [id number]          PATIENT TYPE:  AMB   LOCATION:  SDS                          FACILITY:  [facility]   PHYSICIAN:  Judeth Cornfield. Scot Dock, M.D.DATE OF BIRTH:  24-Aug-1951   DATE OF PROCEDURE:  02/03/2006  DATE OF DISCHARGE:                                 OPERATIVE REPORT   PREOPERATIVE DIAGNOSIS:  Aneurysm of left forearm arteriovenous fistula.   POSTOPERATIVE DIAGNOSIS:  Aneurysm of left forearm arteriovenous fistula.   PROCEDURE:  Ligation of left forearm arteriovenous fistula and resection of  2 large aneurysms.   SURGEON:  Judeth Cornfield. Scot Dock, M.D.   ASSISTANT:  Jacinta Shoe, P.A.   ANESTHESIA:  Local with sedation.   TECHNIQUE:  The patient was taken to the operating room and sedated by  Anesthesia.  The left upper extremity was prepped and draped in the usual  sterile fashion.  Two elliptical incisions were marked to resect the  proximal 2 large aneurysms of this fistula.  After the skin was anesthetized  with  1% lidocaine, the incision over the arterial anastomosis was made and  ellipse of tissue excised.  The aneurysm was very large and it made it  impossible to dissect out the radial artery proximal and distal to the vein.  Therefore, I elected to do this under tourniquet control.  Before  heparinizing the patient, I did make the incision over the other large  aneurysm in the distal forearm after the skin was anesthetized and here this  aneurysm was dissected free.  The patient was then heparinized.  The  tourniquet was placed on the upper arm and the arm was exsanguinated with an  Esmarch bandage.  The tourniquet was inflated to 250 mmHg.  Under tourniquet  control, the aneurysm of the wrist was opened and enough of a cuff of vein  was left attached to the radial artery that this could be oversewn with 6-0  Prolene suture.  Next, the aneurysm here was excised up to the level of  the  incision.  Here too, the aneurysm was incised and ligated with two 2-0 silk  ties and was then excised in its entirety.  Hemostasis was obtained in the  wounds and heparin was reversed with protamine.  The wounds were closed with  a deep layer of 3-0 Vicryl and the skin closed with 4-0 Vicryl.  A sterile  dressings was applied.  The patient tolerated the procedure well and was  transferred to the recovery room in satisfactory condition.  All needle and  sponge counts were correct.      Judeth Cornfield. Scot Dock, M.D.  Electronically Signed     CSD/MEDQ  D:  02/03/2006  T:  02/05/2006  Job:  [id number]

## 2011-03-26 NOTE — Op Note (Signed)
NAMEHILLARY, [name] NO.:  [id number]   MEDICAL RECORD NO.:  [id number]          PATIENT TYPE:  INP   LOCATION:  5525                         FACILITY:  [facility]   PHYSICIAN:  Judeth Cornfield. Scot Dock, M.D.DATE OF BIRTH:  1951-06-08   DATE OF PROCEDURE:  12/13/2006  DATE OF DISCHARGE:                               OPERATIVE REPORT   PREOPERATIVE DIAGNOSIS:  Chronic renal failure.   POSTOPERATIVE DIAGNOSIS:  Chronic renal failure.   PROCEDURE:  Ultrasound-guided placement of a right internal jugular  Diatek catheter.   SURGEON:  Judeth Cornfield. Scot Dock, M.D.   ANESTHESIA:  Local with sedation.   TECHNIQUE:  The patient was taken to the operating room and sedated by  anesthesia.  The ultrasound scanner was used to mark both internal  jugular veins, both of which appeared to be patent.  The neck and upper  chest were then prepped and draped in usual sterile fashion.  After the  skin was infiltrated with 1% lidocaine, the right IJ was cannulated and  a guidewire introduced into the superior vena cava under fluoroscopic  control.  The tract over the wire was then dilated and then a dilator  and peel-away sheath passed over the wire and the wire and dilator  removed.  The catheter was passed through the peel-away sheath and  positioned in the right atrium.  The exit site for the catheter was  selected and the skin anesthetized between the two areas.  The catheter  was then brought through the tunnel, cut to the appropriate length, and  the distal ports were attached.  Both ports withdrew easily, were then  flushed with heparinized saline and filled with concentrated heparin.  The catheter was secured at its exit site with a 3-0 nylon suture.  The  IJ cannulation site was closed with a 4-0 subcuticular stitch.  A  sterile dressing was applied.  The patient tolerated procedure well and  was transferred to the recovery room in satisfactory condition.  All  needle and  sponge counts were correct.      Judeth Cornfield. Scot Dock, M.D.  Electronically Signed     CSD/MEDQ  D:  12/13/2006  T:  12/13/2006  Job:  [id number]

## 2011-04-26 ENCOUNTER — Encounter: Payer: Self-pay | Admitting: Internal Medicine

## 2011-04-26 NOTE — Progress Notes (Signed)
This encounter was created in error - please disregard.

## 2011-04-29 ENCOUNTER — Encounter: Payer: Self-pay | Admitting: Cardiovascular Disease

## 2011-05-03 ENCOUNTER — Encounter: Payer: Self-pay | Admitting: Internal Medicine

## 2011-05-03 ENCOUNTER — Encounter: Payer: Self-pay | Admitting: Cardiovascular Disease

## 2011-05-11 ENCOUNTER — Encounter: Payer: Self-pay | Admitting: Cardiovascular Disease

## 2011-05-23 ENCOUNTER — Encounter: Payer: Self-pay | Admitting: Cardiovascular Disease

## 2011-05-31 ENCOUNTER — Encounter: Payer: Self-pay | Admitting: Cardiovascular Disease

## 2011-06-07 ENCOUNTER — Telehealth: Payer: Self-pay | Admitting: *Deleted

## 2011-06-07 LAB — PROTIME-INR

## 2011-06-07 NOTE — Telephone Encounter (Signed)
INR 3.6    Spoke to Pt he hasn't done anything different really. He did fell out of a tree last week finger bled quite a bit but stopped on its own He knows to hold today and then resume per Dr Fletcher Anon

## 2011-06-14 ENCOUNTER — Encounter: Payer: Self-pay | Admitting: Cardiovascular Disease

## 2011-06-21 ENCOUNTER — Encounter: Payer: Self-pay | Admitting: Cardiovascular Disease

## 2011-06-21 ENCOUNTER — Other Ambulatory Visit: Payer: Self-pay | Admitting: *Deleted

## 2011-06-21 DIAGNOSIS — I4891 Unspecified atrial fibrillation: Secondary | ICD-10-CM

## 2011-06-21 MED ORDER — WARFARIN SODIUM 4 MG PO TABS: 4.0000 mg | ORAL_TABLET | ORAL | Status: DC

## 2011-07-05 ENCOUNTER — Encounter: Payer: Self-pay | Admitting: Cardiovascular Disease

## 2011-08-09 ENCOUNTER — Encounter: Payer: Self-pay | Admitting: Cardiovascular Disease

## 2011-08-09 LAB — PROTIME-INR

## 2011-08-25 ENCOUNTER — Telehealth: Payer: Self-pay | Admitting: Cardiovascular Disease

## 2011-08-25 NOTE — Telephone Encounter (Signed)
All Cardiac faxed to [facility] @ [phone]  08/25/11/km

## 2011-11-26 ENCOUNTER — Ambulatory Visit (INDEPENDENT_AMBULATORY_CARE_PROVIDER_SITE_OTHER): Payer: Self-pay | Admitting: *Deleted

## 2011-11-26 DIAGNOSIS — I4891 Unspecified atrial fibrillation: Secondary | ICD-10-CM

## 2012-03-24 ENCOUNTER — Other Ambulatory Visit: Payer: Self-pay | Admitting: Cardiovascular Disease

## 2015-04-24 ENCOUNTER — Encounter: Payer: Self-pay | Admitting: *Deleted

## 2015-04-24 ENCOUNTER — Telehealth: Payer: Self-pay | Admitting: *Deleted

## 2015-04-24 NOTE — Telephone Encounter (Signed)
called for fm hx & status, spoke to pt.Marland KitchenMarland Kitchen

## 2015-04-24 NOTE — Telephone Encounter (Signed)
called medical records & spoke to [place], requested all medical records pertaining to upcoming appt..faxing records.Marland KitchenMarland Kitchen

## 2015-04-28 ENCOUNTER — Telehealth: Payer: Self-pay | Admitting: *Deleted

## 2015-04-28 NOTE — Telephone Encounter (Signed)
CALLED TRYING TO REQUEST MEDICAL RECORDS AGAIN, I WAS DISCONNETED 3 TIMES.[name] NEVER COULD REACH ANYONE.[name]

## 2015-04-29 ENCOUNTER — Ambulatory Visit (INDEPENDENT_AMBULATORY_CARE_PROVIDER_SITE_OTHER): Payer: Medicare Other | Admitting: Internal Medicine

## 2015-04-29 ENCOUNTER — Encounter: Payer: Self-pay | Admitting: Internal Medicine

## 2015-04-29 VITALS — BP 120/58 | HR 78 | Ht 69.0 in | Wt 205.8 lb

## 2015-04-29 DIAGNOSIS — I480 Paroxysmal atrial fibrillation: Secondary | ICD-10-CM | POA: Diagnosis not present

## 2015-04-29 NOTE — Patient Instructions (Signed)
Medication Instructions:  Your physician recommends that you continue on your current medications as directed. Please refer to the Current Medication list given to you today.  Labwork: None ordered  Testing/Procedures: Your physician has recommended that you wear a 48 hour holter monitor. Holter monitors are medical devices that record the heart's electrical activity. Doctors most often use these monitors to diagnose arrhythmias. Arrhythmias are problems with the speed or rhythm of the heartbeat. The monitor is a small, portable device. You can wear one while you do your normal daily activities. This is usually used to diagnose what is causing palpitations/syncope (passing out).  Follow-Up: Your physician wants you to follow-up in: 6 months with Dr. [name]. You will receive a reminder letter in the mail two months in advance. If you don't receive a letter, please call our office to schedule the follow-up appointment.  Thank you for choosing [facility]!!

## 2015-04-29 NOTE — Progress Notes (Signed)
ELECTROPHYSIOLOGY CONSULT NOTE  Patient ID: [name], MRN: [id number], DOB/AGE: 62/21/1952 62 y.o. Admit date: (Not on file) Date of Consult: 04/29/2015  Primary Physician: No primary care provider on file. Primary Cardiologist: new previous [facility] [place]  Chief Complaint: establish   HPI [name] is a 64 y.o. male  With hx of permanent Afib and CAD w prior CABG -1992  his first MI occurred at the age of 86. As he recounts his bypass surgery he notes that something was done wrong and that he had his heart attack.  He has had no recent chest pain. He has chronic dyspnea on exertion evaluation included a chest x-ray dated 10/15 which was read as mild interstitial volume overload  He is status post renal transplantation and subsequent liver/kidney transplant. His baseline creatinine runs in the 2 range. Fluids were managed by Dr. Lorrene Reid and himself. We do not have any recent laboratories included a outside paperwork which we reviewed extensively today and is included in this note.  He does note that metoprolol was associated with significant worsening of his dyspnea compared to carvedilol. He had remotely been on Inderal.  Echo  EF 45%  CAth 2008  Total native circulation with patent grafts   Nuclear stress 1/16>> EF 41% no ischemia but prior anterolateraland posteroinferior defects.  Echo 2/14 EF 51% Past Medical History  Diagnosis Date  . Atrial fibrillation 10/10  . CAD (coronary artery disease)     s/p CABG -- 1992  . ESRD (end stage renal disease)   . Idiopathic thrombocytopenia   . HLD (hyperlipidemia)   . HTN (hypertension)       Surgical History:  Past Surgical History  Procedure Laterality Date  . Coronary artery bypass graft    . Kidney transplant    . Liver transplant    . Facial cosmetic surgery    . Hernia repair    . Appendectomy    . Gallbladder surgery       Home Meds: Prior to Admission medications   Medication Sig Start Date End  Date Taking? Authorizing Provider  carvedilol (COREG) 6.25 MG tablet Take 6.25 mg by mouth 2 (two) times daily with a meal.  04/16/15  Yes Historical Provider, MD  EPINEPHrine (EPIPEN) 0.3 mg/0.3 mL DEVI Inject 0.3 mg into the muscle as needed.     Yes Historical Provider, MD  fish oil-omega-3 fatty acids 1000 MG capsule Take 4 g by mouth daily.     Yes Historical Provider, MD  furosemide (LASIX) 20 MG tablet Take 10 mg by mouth daily.     Yes Historical Provider, MD  mycophenolate (CELLCEPT) 250 MG capsule Take 250 mg by mouth 2 (two) times daily.     Yes Historical Provider, MD  predniSONE (DELTASONE) 5 MG tablet Take 5 mg by mouth daily.     Yes Historical Provider, MD  QUEtiapine Fumarate (SEROQUEL XR) 150 MG TB24 Take 1 tablet by mouth daily.     Yes Historical Provider, MD  sertraline (ZOLOFT) 100 MG tablet Take 100 mg by mouth daily.     Yes Historical Provider, MD  tacrolimus (PROGRAF) 1 MG capsule Take 1 mg by mouth 2 (two) times daily.     Yes Historical Provider, MD  warfarin (COUMADIN) 5 MG tablet Take by mouth as directed.     Yes Historical Provider, MD  warfarin (COUMADIN) 4 MG tablet Take 1 tablet (4 mg total) by mouth as directed. 06/21/11 06/20/12  [place]  Ferne Reus, MD      Allergies:  Allergies  Allergen Reactions  . Haloperidol Other (See Comments)  . Nsaids Other (See Comments)  . Rifampin Other (See Comments)  . Triazolam Other (See Comments)    History   Social History  . Marital Status: Single    Spouse Name: N/A  . Number of Children: 0  . Years of Education: N/A   Occupational History  . retired    Social History Main Topics  . Smoking status: Never Smoker   . Smokeless tobacco: Not on file  . Alcohol Use: No  . Drug Use: No  . Sexual Activity: Not on file   Other Topics Concern  . Not on file   Social History Narrative     Family History  Problem Relation Age of Onset  . Atrial fibrillation Mother   . Breast cancer Mother   . Renal cancer  Father   . Hypertension Brother   . Hyperlipidemia Brother   . Other Brother     stent   . Renal Disease Maternal Grandmother   . Stroke Maternal Grandmother   . Heart attack Maternal Grandfather   . Cancer Paternal Grandmother   . Heart failure Paternal Grandfather   . Emphysema Paternal Grandfather   . Bronchiolitis Paternal Grandfather      ROS:  Please see the history of present illness.   Negative except Easy bruisability anxiety and depression  All other systems reviewed and negative.    Physical Exam   Blood pressure 120/58, pulse 78, height 5\' 9"  (1.753 m), weight 205 lb 12.8 oz (93.35 kg). General: Well developed, well nourished male in no acute distress. Head: Normocephalic, atraumatic, sclera non-icteric, no xanthomas, nares are without discharge. EENT: normal Lymph Nodes:  none Back: without scoliosis/kyphosis  no CVA tendersness Neck: Negative for carotid bruits. JVD 10 Lungs: Clear bilaterally to auscultation without wheezes, rales, or rhonchi. Breathing is unlabored. Heart: RRR with S1 S2.  * 2/6 systolic  murmur , rubs, or gallops appreciated. Abdomen: Soft, non-tender, protuberant with multiple scars and hernia with normoactive bowel sounds. No hepatomegaly. No rebound/guarding. No obvious abdominal masses. Msk:  Strength and tone appear normal for age. Extremities: No clubbing or cyanosis.  tr edema.  Distal pedal pulses are 2+ and equal bilaterally. Skin: Warm and Dry Neuro: Alert and oriented X 3. CN III-XII intact Grossly normal sensory and motor function . Psych:  Responds to questions appropriately with a normal affect.      Labs: Cardiac Enzymes No results for input(s): CKTOTAL, CKMB, TROPONINI in the last 72 hours. CBC No results found for: WBC, HGB, HCT, MCV, PLT PROTIME: No results for input(s): LABPROT, INR in the last 72 hours. Chemistry No results for input(s): NA, K, CL, CO2, BUN, CREATININE, CALCIUM, PROT, BILITOT, ALKPHOS, ALT, AST, GLUCOSE  in the last 168 hours.  Invalid input(s): LABALBU Lipids No results found for: CHOL, HDL, LDLCALC, TRIG BNP No results found for: PROBNP Thyroid Function Tests: No results for input(s): TSH, T4TOTAL, T3FREE, THYROIDAB in the last 72 hours.  Invalid input(s): FREET3    Miscellaneous No results found for: DDIMER  Radiology/Studies:  No results found.  EKG: Atrial fibrillation at 78 Intervals-/10/40 Axis LXXI R-wave progression Nonspecific ST changes    Assessment and Plan:   Atrial fibrillation-permanent  Dyspnea on exertion  Coronary artery disease with prior bypass surgery ejection fraction 45-50%  Liver kidney transplant on immunosuppression     the patient has permanent atrial fibrillation  and dyspnea. He has reasonable LV function and no ischemia by recent Myoview. The operative variable for modulating his dyspnea then would be control of his rate. We will check a Holter monitor.  She mentioned that his heart rate went down with exercise; however obviously be ominous and suggested for infra-Hisian disease not totally without possibility given his QRS widening suggestive of some degree of conduction system disease  In the context of his atrial fibrillation, based on the SPORTIF data aspirin plus warfarin increase his risk without benefit and hence, would recommend that the aspirin be discontinued. He does have a history of post bypass stenting but this is remote. In addition, his coronary artery disease begs, unless there is a contraindication related to his liver/kidney transplant, full dose statin therapy     Virl Axe

## 2015-05-05 ENCOUNTER — Ambulatory Visit (INDEPENDENT_AMBULATORY_CARE_PROVIDER_SITE_OTHER): Payer: Medicare Other

## 2015-05-05 DIAGNOSIS — I480 Paroxysmal atrial fibrillation: Secondary | ICD-10-CM

## 2015-06-27 ENCOUNTER — Other Ambulatory Visit: Payer: Self-pay | Admitting: *Deleted

## 2015-06-27 MED ORDER — CARVEDILOL 12.5 MG PO TABS: 12.5000 mg | ORAL_TABLET | Freq: Two times a day (BID) | ORAL | Status: DC

## 2015-07-31 ENCOUNTER — Encounter: Payer: Self-pay | Admitting: Internal Medicine

## 2015-07-31 ENCOUNTER — Ambulatory Visit (INDEPENDENT_AMBULATORY_CARE_PROVIDER_SITE_OTHER): Payer: Medicare Other | Admitting: Internal Medicine

## 2015-07-31 VITALS — BP 120/66 | HR 71 | Ht 69.0 in | Wt 207.6 lb

## 2015-07-31 DIAGNOSIS — I4821 Permanent atrial fibrillation: Secondary | ICD-10-CM

## 2015-07-31 DIAGNOSIS — I482 Chronic atrial fibrillation: Secondary | ICD-10-CM | POA: Diagnosis not present

## 2015-07-31 NOTE — Progress Notes (Signed)
Patient Care Team: [name], MD as PCP - General (Family Medicine)   HPI  [name] is a 64 y.o. male seen in followup for permanent atrial fibrillation in the context of CAD with prior CABG and EF 41%  He has complaints of ongoing issues with shortness of breath. There is some degree of volume overload. He is dyspneic at less than 15-20 feet. He has chronic two-pillow orthopnea which is not changed. He denies abdominal distention. There is been no change in his diet. He denies exertional chest discomfort.   Records and Results Reviewed -HOOLTER  6/16  HR excursion quite broad and carvidolol doubled DATE TEST    1/16 Myoview     EF41   anterolateral and inferior scars              Last catheterization report was 2008 at  [facility] was reviewed    Past Medical History  Diagnosis Date  . Atrial fibrillation 10/10  . CAD (coronary artery disease)     s/p CABG -- 1992  . ESRD (end stage renal disease)   . Idiopathic thrombocytopenia   . HLD (hyperlipidemia)   . HTN (hypertension)     Past Surgical History  Procedure Laterality Date  . Coronary artery bypass graft    . Kidney transplant    . Liver transplant    . Facial cosmetic surgery    . Hernia repair    . Appendectomy    . Gallbladder surgery      Current Outpatient Prescriptions  Medication Sig Dispense Refill  . allopurinol (ZYLOPRIM) 100 MG tablet Take 100 mg by mouth daily.    [name] atorvastatin (LIPITOR) 10 MG tablet Take 10 mg by mouth daily.    . carvedilol (COREG) 12.5 MG tablet Take 1 tablet (12.5 mg total) by mouth 2 (two) times daily. 60 tablet 3  . cephALEXin (KEFLEX) 500 MG capsule Take 500 mg by mouth 3 (three) times daily.    [name] EPINEPHrine (EPIPEN) 0.3 mg/0.3 mL DEVI Inject 0.3 mg into the muscle as needed.      . fish oil-omega-3 fatty acids 1000 MG capsule Take 4 g by mouth daily.      . furosemide (LASIX) 40 MG tablet Take 40 mg by mouth every morning.    . mycophenolate  (CELLCEPT) 250 MG capsule Take 250 mg by mouth 2 (two) times daily.      . predniSONE (DELTASONE) 5 MG tablet Take 5 mg by mouth daily.      . QUEtiapine (SEROQUEL) 100 MG tablet Take 100 mg by mouth at bedtime.    . sertraline (ZOLOFT) 100 MG tablet Take 100 mg by mouth daily.      . tacrolimus (PROGRAF) 1 MG capsule Take 1 mg by mouth 2 (two) times daily.      . traZODone (DESYREL) 150 MG tablet Take 150 mg by mouth at bedtime.    [name] warfarin (COUMADIN) 4 MG tablet Take 1 tablet (4 mg total) by mouth as directed. 30 tablet 2  . warfarin (COUMADIN) 5 MG tablet Take by mouth as directed.       No current facility-administered medications for this visit.    Allergies  Allergen Reactions  . Haloperidol Other (See Comments)  . Nsaids Other (See Comments)  . Rifampin Other (See Comments)  . Triazolam Other (See Comments)      Review of Systems negative except from HPI and PMH  Physical Exam BP 120/66  mmHg  Pulse 71  Ht 5\' 9"  (1.753 m)  Wt 207 lb 9.6 oz (94.167 kg)  BMI 30.64 kg/m2 Well developed and well nourished in no acute distress HENT normal E scleral and icterus clear Neck Supple JVP 10 cm with positive HJR; carotids brisk and full Clear to ausculation Irregularly irregular rate and rhythm, no murmurs gallops or rub Soft with active bowel sounds No clubbing cyanosis 1+ Edema Alert and oriented, grossly normal motor and sensory function Skin Warm and Dry  ECG was reviewed today demonstrated atrial fibrillation at 108 Intervals-/11/41 Axis XCIV Prior inferior wall MI Prior anterior wall \\MI    Assessment and  Plan  Atrial fibrillatoin  CAD  S/p CABG  Congestive heart failure-acute on chronic-systolic/diastolic  Status post renal transplant   He is volume overloaded. I have discussed with Dr. Lorrene Reid decreasing his diuretics. We'll change him from 40 every morning--40 3 times a day 3 days and then to have take 40 twice a day 2 times per week. We will have him  follow-up with one of our PAs in about 3 weeks time. He'll need a metabolic profile then. We will also plan to get an echocardiogram; the last assessment of LVEF was with a Myoview. Not sure how well was gated with him being in atrial fibrillation.  At some point there may be need to undertake cardiac catheterization his last one having been done in 2008

## 2015-07-31 NOTE — Patient Instructions (Signed)
Medication Instructions:  Your physician has recommended you make the following change in your medication: 1) CHANGE the way you take your Lasix -- take 40 mg twice daily for 3 days.  Then reduce and take 40 mg twice daily two times per week  Labwork: None ordered  Testing/Procedures: Your physician has requested that you have an echocardiogram. Echocardiography is a painless test that uses sound waves to create images of your heart. It provides your doctor with information about the size and shape of your heart and how well your heart's chambers and valves are working. This procedure takes approximately one hour. There are no restrictions for this procedure.  Follow-Up: Your physician recommends that you schedule a follow-up appointment in: 3 weeks with Richardson Dopp, PA.  Any Other Special Instructions Will Be Listed Below (If Applicable). Thank you for choosing [facility]!!

## 2015-08-07 ENCOUNTER — Other Ambulatory Visit: Payer: Self-pay | Admitting: Internal Medicine

## 2015-08-07 ENCOUNTER — Other Ambulatory Visit: Payer: Self-pay

## 2015-08-07 ENCOUNTER — Ambulatory Visit (HOSPITAL_COMMUNITY): Payer: Medicare Other | Attending: Cardiology

## 2015-08-07 DIAGNOSIS — Z951 Presence of aortocoronary bypass graft: Secondary | ICD-10-CM | POA: Diagnosis not present

## 2015-08-07 DIAGNOSIS — I517 Cardiomegaly: Secondary | ICD-10-CM | POA: Diagnosis not present

## 2015-08-07 DIAGNOSIS — I482 Chronic atrial fibrillation, unspecified: Secondary | ICD-10-CM

## 2015-08-07 DIAGNOSIS — E785 Hyperlipidemia, unspecified: Secondary | ICD-10-CM | POA: Diagnosis not present

## 2015-08-07 DIAGNOSIS — I4891 Unspecified atrial fibrillation: Secondary | ICD-10-CM | POA: Diagnosis present

## 2015-08-07 DIAGNOSIS — I120 Hypertensive chronic kidney disease with stage 5 chronic kidney disease or end stage renal disease: Secondary | ICD-10-CM | POA: Diagnosis not present

## 2015-08-07 DIAGNOSIS — N186 End stage renal disease: Secondary | ICD-10-CM | POA: Diagnosis not present

## 2015-08-07 DIAGNOSIS — Z8249 Family history of ischemic heart disease and other diseases of the circulatory system: Secondary | ICD-10-CM | POA: Diagnosis not present

## 2015-08-07 DIAGNOSIS — I340 Nonrheumatic mitral (valve) insufficiency: Secondary | ICD-10-CM | POA: Insufficient documentation

## 2015-08-07 DIAGNOSIS — I351 Nonrheumatic aortic (valve) insufficiency: Secondary | ICD-10-CM | POA: Insufficient documentation

## 2015-08-19 NOTE — Progress Notes (Signed)
Cardiology Office Note   Date:  08/20/2015   ID:  [name], DOB 27-Sep-1951, MRN [id number]  PCP:  [name]., MD  Cardiologist:  Dr. Virl Axe     Chief Complaint  Patient presents with  . Follow-up  . Congestive Heart Failure     History of Present Illness: [name] is a 64 y.o. 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  He is followed by the [facility] transplant team and Dr. Lorrene Reid for Nephrology.   LHC in 2008 with 3/4 grafts patent.  Myoview in 1/16 with ant-lat and post-lat scar, no ischemia.  He established with Dr. Virl Axe in 6/16.  Holter demonstrated poor HR control with HR 55-157. His beta-blocker was adjusted.  Last seen by Dr. Virl Axe 9/16.  He was noted to be volume overloaded.  In conjunction with his Nephrologist, his Lasix was adjusted. FU echo demonstrated EF 45-50%.  There were elevated filling pressures noted.  He returns for FU.  He is doing much better.  His breathing is improved.  He denies chest pain.  He denies PND, edema, syncope. He sleeps on 2 pillows chronically.  No cough or wheezing.      Studies/Reports Reviewed Today:  Echo 08/07/15 EF 45-50%, diff HK, probable diastolic dysfunction, MAC, mild AI, mild MR, mod LAE, mild RVE, mild reduced RVSF  Myoview 11/19/14 Anterolateral and posterolateral scar, no ischemia, EF 41%  LHC 6/08 LAD:  100% LCx 100% RCA 100% S-OM3: ok S-OM1: 100% S-RI: ok L-LAD: ok    Past Medical History  Diagnosis Date  . Atrial fibrillation ([place]) 10/10  . CAD (coronary artery disease)     s/p CABG -- 1992  . ESRD (end stage renal disease) ([place])   . Idiopathic thrombocytopenia ([place])   . HLD (hyperlipidemia)   . HTN (hypertension)     Past Surgical History    Procedure Laterality Date  . Coronary artery bypass graft    . Kidney transplant    . Liver transplant    . Facial cosmetic surgery    . Hernia repair    . Appendectomy    . Gallbladder surgery       Current Outpatient Prescriptions  Medication Sig Dispense Refill  . allopurinol (ZYLOPRIM) 100 MG tablet Take 100 mg by mouth daily.    [name] atorvastatin (LIPITOR) 10 MG tablet Take 10 mg by mouth daily.    . carvedilol (COREG) 12.5 MG tablet Take 1 tablet (12.5 mg total) by mouth 2 (two) times daily. 60 tablet 3  . EPINEPHrine (EPIPEN) 0.3 mg/0.3 mL DEVI Inject 0.3 mg into the muscle as needed.      . fish oil-omega-3 fatty acids 1000 MG capsule Take 4 g by mouth daily.      . furosemide (LASIX) 40 MG tablet Take 40 mg by mouth every morning.    . mycophenolate (CELLCEPT) 250 MG capsule Take 250 mg by mouth 2 (two) times daily.      . predniSONE (DELTASONE) 5 MG tablet Take 5 mg by mouth daily.      . QUEtiapine (SEROQUEL) 100 MG tablet Take 100 mg by mouth at bedtime.    . sertraline (ZOLOFT) 100 MG tablet Take 100 mg by mouth daily.      . tacrolimus (  PROGRAF) 1 MG capsule Take 1 mg by mouth 2 (two) times daily.      . traZODone (DESYREL) 150 MG tablet Take 150 mg by mouth at bedtime.    [name] warfarin (COUMADIN) 5 MG tablet Take by mouth as directed.      . warfarin (COUMADIN) 4 MG tablet Take 1 tablet (4 mg total) by mouth as directed. 30 tablet 2   No current facility-administered medications for this visit.    Allergies:   Haloperidol; Nsaids; Rifampin; and Triazolam    Social History:  The patient  reports that he has never smoked. He does not have any smokeless tobacco history on file. He reports that he does not drink alcohol or use illicit drugs.   Family History:  The patient's family history includes Atrial fibrillation in his mother; Breast cancer in his mother; Bronchiolitis in his paternal grandfather; Cancer in his paternal grandmother; Emphysema in his paternal  grandfather; Heart attack in his maternal grandfather; Heart failure in his paternal grandfather; Hyperlipidemia in his brother; Hypertension in his brother; Other in his brother; Renal Disease in his maternal grandmother; Renal cancer in his father; Stroke in his maternal grandmother.    ROS:   Please see the history of present illness.   Review of Systems  Cardiovascular: Positive for dyspnea on exertion and irregular heartbeat.  Hematologic/Lymphatic: Bruises/bleeds easily.  Psychiatric/Behavioral: Positive for depression. The patient is nervous/anxious.   All other systems reviewed and are negative.     PHYSICAL EXAM: VS:  BP 124/60 mmHg  Pulse 90  Ht 6' (1.829 m)  Wt 202 lb (91.627 kg)  BMI 27.39 kg/m2    Wt Readings from Last 3 Encounters:  08/20/15 202 lb (91.627 kg)  07/31/15 207 lb 9.6 oz (94.167 kg)  04/29/15 205 lb 12.8 oz (93.35 kg)     GEN: Well nourished, well developed, in no acute distress HEENT: normal Neck: no JVD,   no masses Cardiac:  Normal S1/S2, irreg irreg rhythm; no murmur ,  no rubs or gallops, no edema   Respiratory:  clear to auscultation bilaterally, no wheezing, rhonchi or rales. GI: soft, nontender, nondistended, + BS MS: no deformity or atrophy Skin: warm and dry  Neuro:  CNs II-XII intact, Strength and sensation are intact Psych: Normal affect   EKG:  EKG is not ordered today.  It demonstrates:   n/a   Recent Labs: No results found for requested labs within last 365 days.    Lipid Panel No results found for: CHOL, TRIG, HDL, CHOLHDL, VLDL, LDLCALC, LDLDIRECT    ASSESSMENT AND PLAN:  1. Chronic Combined Systolic and Diastolic CHF:  Volume is improved.  He is down 5 lbs on his current diuretic regimen. We discussed the importance of daily weights and when to call. Recent echo with fairly stable EF at 45-50%.  His echo in 2012 [facility] demonstrated EF 50%.  BP is controlled.  Continue beta-blocker.  I would not start ACE inhibitor or  angiotensin receptor blocker unless his Nephrologist suggested it.  At this point, I am not certain there would be much benefit to adding Hydralazine and Nitrates.  Recent Creatinine 9/27 is 1.6.  He had labs drawn last week at the [facility] in [place].  Creatinine was 1.4.  I will request a copy of his labs.    2. CAD s/p CABG:  LHC in 2008 with 3/4 grafts patent.  Myoview in 1/16 with scar but no ischemia.  He denies anginal symptoms. Continue  beta-blocker, statin.  He is not on ASA as he is on Coumadin.   3. Chronic Atrial Fibrillation:  Rate controlled. Continue Coumadin. He checks levels at home and manages it with [facility] in [place].   4. S/p Combined Renal and Hepatic Transplant:  FU with Transplant Team at [facility] and Dr. Lorrene Reid in [place] as planned.  5. HTN:  Controlled.   6. Hyperlipidemia:   Continue statin Rx.      Medication Changes: Current medicines are reviewed at length with the patient today.  Concerns regarding medicines are as outlined above.  The following changes have been made:   Discontinued Medications   CEPHALEXIN (KEFLEX) 500 MG CAPSULE    Take 500 mg by mouth 3 (three) times daily.   Modified Medications   No medications on file   New Prescriptions   No medications on file   Labs/ tests ordered today include:   No orders of the defined types were placed in this encounter.     Disposition:    FU with Dr. Virl Axe 10/2015 as planned.     Signed, Versie Starks, MHS 08/20/2015 3:27 PM    [facility] [place], [place], [place]  [address] Phone: [phone]; Fax: [phone]

## 2015-08-20 ENCOUNTER — Encounter: Payer: Self-pay | Admitting: Physician Assistant

## 2015-08-20 ENCOUNTER — Ambulatory Visit (INDEPENDENT_AMBULATORY_CARE_PROVIDER_SITE_OTHER): Payer: Medicare Other | Admitting: Physician Assistant

## 2015-08-20 VITALS — BP 124/60 | HR 90 | Ht 72.0 in | Wt 202.0 lb

## 2015-08-20 DIAGNOSIS — I2510 Atherosclerotic heart disease of native coronary artery without angina pectoris: Secondary | ICD-10-CM | POA: Diagnosis not present

## 2015-08-20 DIAGNOSIS — I482 Chronic atrial fibrillation, unspecified: Secondary | ICD-10-CM

## 2015-08-20 DIAGNOSIS — E785 Hyperlipidemia, unspecified: Secondary | ICD-10-CM

## 2015-08-20 DIAGNOSIS — Z944 Liver transplant status: Secondary | ICD-10-CM

## 2015-08-20 DIAGNOSIS — Z940 Kidney transplant status: Secondary | ICD-10-CM

## 2015-08-20 DIAGNOSIS — I5042 Chronic combined systolic (congestive) and diastolic (congestive) heart failure: Secondary | ICD-10-CM | POA: Diagnosis not present

## 2015-08-20 DIAGNOSIS — I10 Essential (primary) hypertension: Secondary | ICD-10-CM

## 2015-08-20 NOTE — Patient Instructions (Signed)
Medication Instructions:  Your physician recommends that you continue on your current medications as directed. Please refer to the Current Medication list given to you today.   Labwork: NONE  Testing/Procedures: NONE  Follow-Up: DR. [name]  10/22/15 @ 4 PM  Any Other Special Instructions Will Be Listed Below (If Applicable).

## 2015-10-22 ENCOUNTER — Ambulatory Visit (INDEPENDENT_AMBULATORY_CARE_PROVIDER_SITE_OTHER): Payer: Medicare Other | Admitting: Internal Medicine

## 2015-10-22 ENCOUNTER — Encounter: Payer: Self-pay | Admitting: Internal Medicine

## 2015-10-22 VITALS — BP 122/62 | HR 78 | Ht 72.0 in | Wt 209.4 lb

## 2015-10-22 DIAGNOSIS — I2510 Atherosclerotic heart disease of native coronary artery without angina pectoris: Secondary | ICD-10-CM | POA: Diagnosis not present

## 2015-10-22 DIAGNOSIS — I482 Chronic atrial fibrillation: Secondary | ICD-10-CM | POA: Diagnosis not present

## 2015-10-22 DIAGNOSIS — I4821 Permanent atrial fibrillation: Secondary | ICD-10-CM

## 2015-10-22 DIAGNOSIS — I5042 Chronic combined systolic (congestive) and diastolic (congestive) heart failure: Secondary | ICD-10-CM | POA: Diagnosis not present

## 2015-10-22 MED ORDER — ATORVASTATIN CALCIUM 10 MG PO TABS: 10.0000 mg | ORAL_TABLET | Freq: Every day | ORAL | Status: DC

## 2015-10-22 NOTE — Progress Notes (Signed)
Patient Care Team: [name], MD as PCP - General (Family Medicine) Deboraha Sprang, MD as Consulting Physician (Cardiology) Jamal Maes, MD as Consulting Physician (Nephrology)   HPI  [name] is a 64 y.o. male seen in followup for permanent atrial fibrillation in the context of CAD with prior CABG and EF 41%  When last having been seen 9/16 is completed been shortness of breath. He got better with increasing diuresis. Over recent days it has worsened again. He has some degree of volume overload manifested by edema.  He also notes a reproducible left shoulder discomfort associated with exertion particularly heavy exertion resolving rapidly with rest.   He has chronic two-pillow orthopnea which is not changed. He denies abdominal distention. There is been no change in his diet. He denies exertional chest discomfort.   Records and Results Reviewed -[facility]  6/16  HR excursion quite broad and carvidolol doubled DATE TEST    1/16 Myoview     EF41   anterolateral and inferior scars   9/16 echo EF 45-50 Reduced RVSF/Mod LAE    Last catheterization report was 2008 at  [facility] was reviewed Echo 08/07/15    Myoview 11/19/14 Anterolateral and posterolateral scar, no ischemia, EF 41%   Past Medical History  Diagnosis Date  . Atrial fibrillation ([facility]) 10/10  . CAD (coronary artery disease)     s/p CABG -- 1992  . ESRD (end stage renal disease) ([place])   . Idiopathic thrombocytopenia ([place])   . HLD (hyperlipidemia)   . HTN (hypertension)     Past Surgical History  Procedure Laterality Date  . Coronary artery bypass graft    . Kidney transplant    . Liver transplant    . Facial cosmetic surgery    . Hernia repair    . Appendectomy    . Gallbladder surgery      Current Outpatient Prescriptions  Medication Sig Dispense Refill  . allopurinol (ZYLOPRIM) 100 MG tablet Take 100 mg by mouth daily.    [name] atorvastatin (LIPITOR) 10 MG tablet Take 10 mg by  mouth daily.    . carvedilol (COREG) 12.5 MG tablet Take 1 tablet (12.5 mg total) by mouth 2 (two) times daily. 60 tablet 3  . EPINEPHrine (EPIPEN) 0.3 mg/0.3 mL DEVI Inject 0.3 mg into the muscle as needed.      . fish oil-omega-3 fatty acids 1000 MG capsule Take 4 g by mouth daily.      . furosemide (LASIX) 40 MG tablet Take 40 mg by mouth every morning.    . mycophenolate (CELLCEPT) 250 MG capsule Take 250 mg by mouth 2 (two) times daily.      . predniSONE (DELTASONE) 5 MG tablet Take 5 mg by mouth daily.      . QUEtiapine (SEROQUEL) 100 MG tablet Take 100 mg by mouth at bedtime.    . tacrolimus (PROGRAF) 1 MG capsule Take 1 mg by mouth 2 (two) times daily.      . traZODone (DESYREL) 150 MG tablet Take 150 mg by mouth at bedtime.    [name] warfarin (COUMADIN) 4 MG tablet Take 1 tablet (4 mg total) by mouth as directed. 30 tablet 2  . warfarin (COUMADIN) 5 MG tablet Take by mouth as directed.       No current facility-administered medications for this visit.    Allergies  Allergen Reactions  . Haloperidol Other (See Comments)  . Nsaids Other (See Comments)  . Rifampin Other (  See Comments)  . Triazolam Other (See Comments)      Review of Systems negative except from HPI and PMH  Physical Exam Ht 6' (1.829 m)  Wt 209 lb 6 oz (94.972 kg)  BMI 28.39 kg/m2 Well developed and well nourished in no acute distress HENT normal E scleral and icterus clear Neck Supple JVP 8 cm with positive HJR; carotids brisk and full Clear to ausculation Irregularly irregular rate and rhythm, 2/6 systolic murmur  gallops or rub Soft with active bowel sounds No clubbing cyanosis 1+ Edema Alert and oriented, grossly normal motor and sensory function Skin Warm and Dry  ECG was reviewed today demonstrated atrial fibrillation at 39 Intervals-/10/38 Axis XCIV Prior inferior wall MI Prior anterior wall \\MI    Assessment and  Plan  Atrial fibrillation permanent  CAD  S/p CABG  Congestive heart  failure-acute on chronic-systolic/diastolic  Status post renal transplant  He has become a little volume overloaded again. We will increase his diuretic to twice a day for 3 days. He notes that her creatinine just a couple weeks ago by his PCP within range of normal, about 2.6.  His chest pain may well be ischemic. However, the benefits of intervention related to amelioration of symptoms. The risk in him would be high because of his kidney issues. He is okay with the pain. With a non-high-risk Myoview a year ago, we have decided to not intervene at all. He does have nitroglycerin at home which she has not used.\  He would like to see if we could follow his home Coumadin program in our office. We will check

## 2015-10-22 NOTE — Patient Instructions (Signed)
Medication Instructions: 1) increase lasix (furosemide) to 40 mg one tablet by mouth twice daily x 3 days, then resume once daily  Labwork: - none  Procedures/Testing: - none  Follow-Up: - Your physician wants you to follow-up in: 1 year with Dr. [name]. You will receive a reminder letter in the mail two months in advance. If you don't receive a letter, please call our office to schedule the follow-up appointment.  Any Additional Special Instructions Will Be Listed Below (If Applicable). - none

## 2015-11-04 LAB — POCT INR: INR: 2

## 2015-11-07 ENCOUNTER — Ambulatory Visit (INDEPENDENT_AMBULATORY_CARE_PROVIDER_SITE_OTHER): Payer: Medicare Other | Admitting: Pharmacist

## 2015-11-07 DIAGNOSIS — Z7901 Long term (current) use of anticoagulants: Secondary | ICD-10-CM

## 2015-11-07 DIAGNOSIS — Z5181 Encounter for therapeutic drug level monitoring: Secondary | ICD-10-CM

## 2015-11-07 DIAGNOSIS — Z7189 Other specified counseling: Secondary | ICD-10-CM | POA: Insufficient documentation

## 2015-11-18 ENCOUNTER — Ambulatory Visit (INDEPENDENT_AMBULATORY_CARE_PROVIDER_SITE_OTHER): Payer: Medicare Other | Admitting: Cardiology

## 2015-11-18 DIAGNOSIS — Z5181 Encounter for therapeutic drug level monitoring: Secondary | ICD-10-CM

## 2015-11-18 DIAGNOSIS — Z7901 Long term (current) use of anticoagulants: Secondary | ICD-10-CM

## 2015-11-18 LAB — POCT INR: INR: 2.2

## 2015-12-02 ENCOUNTER — Ambulatory Visit (INDEPENDENT_AMBULATORY_CARE_PROVIDER_SITE_OTHER): Payer: Medicare Other | Admitting: Cardiology

## 2015-12-02 ENCOUNTER — Ambulatory Visit: Payer: Self-pay | Admitting: Cardiology

## 2015-12-02 DIAGNOSIS — Z5181 Encounter for therapeutic drug level monitoring: Secondary | ICD-10-CM

## 2015-12-02 DIAGNOSIS — Z7901 Long term (current) use of anticoagulants: Secondary | ICD-10-CM

## 2015-12-02 LAB — POCT INR: INR: 1.4

## 2015-12-03 ENCOUNTER — Other Ambulatory Visit: Payer: Self-pay | Admitting: Internal Medicine

## 2015-12-10 ENCOUNTER — Ambulatory Visit (INDEPENDENT_AMBULATORY_CARE_PROVIDER_SITE_OTHER): Payer: Medicare Other | Admitting: Internal Medicine

## 2015-12-10 DIAGNOSIS — Z7901 Long term (current) use of anticoagulants: Secondary | ICD-10-CM

## 2015-12-10 DIAGNOSIS — Z5181 Encounter for therapeutic drug level monitoring: Secondary | ICD-10-CM

## 2015-12-10 LAB — POCT INR: INR: 1.7

## 2015-12-17 ENCOUNTER — Ambulatory Visit (INDEPENDENT_AMBULATORY_CARE_PROVIDER_SITE_OTHER): Payer: Medicare Other | Admitting: Interventional Cardiology

## 2015-12-17 DIAGNOSIS — Z7901 Long term (current) use of anticoagulants: Secondary | ICD-10-CM

## 2015-12-17 DIAGNOSIS — Z5181 Encounter for therapeutic drug level monitoring: Secondary | ICD-10-CM

## 2015-12-17 LAB — POCT INR: INR: 2.2

## 2015-12-25 LAB — POCT INR: INR: 2.1

## 2015-12-26 ENCOUNTER — Ambulatory Visit (INDEPENDENT_AMBULATORY_CARE_PROVIDER_SITE_OTHER): Payer: Medicare Other | Admitting: Internal Medicine

## 2015-12-26 DIAGNOSIS — Z7901 Long term (current) use of anticoagulants: Secondary | ICD-10-CM

## 2015-12-26 DIAGNOSIS — Z5181 Encounter for therapeutic drug level monitoring: Secondary | ICD-10-CM

## 2016-01-01 LAB — POCT INR: INR: 3.7

## 2016-01-02 ENCOUNTER — Other Ambulatory Visit: Payer: Self-pay

## 2016-01-02 ENCOUNTER — Ambulatory Visit (INDEPENDENT_AMBULATORY_CARE_PROVIDER_SITE_OTHER): Payer: Medicare Other | Admitting: Cardiovascular Disease

## 2016-01-02 DIAGNOSIS — Z7901 Long term (current) use of anticoagulants: Secondary | ICD-10-CM

## 2016-01-02 DIAGNOSIS — Z5181 Encounter for therapeutic drug level monitoring: Secondary | ICD-10-CM

## 2016-01-02 MED ORDER — ATORVASTATIN CALCIUM 10 MG PO TABS: 10.0000 mg | ORAL_TABLET | Freq: Every day | ORAL | Status: DC

## 2016-01-02 NOTE — Telephone Encounter (Signed)
Refill request received via fax from [facility] for a 90 day supply instead of 30 days which was sent in 10/2015. Refill sent.

## 2016-01-09 LAB — POCT INR: INR: 2.1

## 2016-01-12 ENCOUNTER — Ambulatory Visit (INDEPENDENT_AMBULATORY_CARE_PROVIDER_SITE_OTHER): Payer: Medicare Other

## 2016-01-12 DIAGNOSIS — Z5181 Encounter for therapeutic drug level monitoring: Secondary | ICD-10-CM

## 2016-01-12 DIAGNOSIS — Z7901 Long term (current) use of anticoagulants: Secondary | ICD-10-CM

## 2016-01-19 ENCOUNTER — Ambulatory Visit (INDEPENDENT_AMBULATORY_CARE_PROVIDER_SITE_OTHER): Payer: Medicare Other | Admitting: Pharmacist

## 2016-01-19 DIAGNOSIS — Z7901 Long term (current) use of anticoagulants: Secondary | ICD-10-CM

## 2016-01-19 DIAGNOSIS — Z5181 Encounter for therapeutic drug level monitoring: Secondary | ICD-10-CM

## 2016-01-19 LAB — POCT INR: INR: 2.2

## 2016-01-26 ENCOUNTER — Ambulatory Visit (INDEPENDENT_AMBULATORY_CARE_PROVIDER_SITE_OTHER): Payer: Medicare Other | Admitting: Cardiovascular Disease

## 2016-01-26 DIAGNOSIS — Z7901 Long term (current) use of anticoagulants: Secondary | ICD-10-CM

## 2016-01-26 DIAGNOSIS — Z5181 Encounter for therapeutic drug level monitoring: Secondary | ICD-10-CM

## 2016-01-26 LAB — POCT INR: INR: 2.6

## 2016-02-03 ENCOUNTER — Ambulatory Visit (INDEPENDENT_AMBULATORY_CARE_PROVIDER_SITE_OTHER): Payer: Medicare Other | Admitting: Cardiovascular Disease

## 2016-02-03 DIAGNOSIS — Z5181 Encounter for therapeutic drug level monitoring: Secondary | ICD-10-CM

## 2016-02-03 DIAGNOSIS — Z7901 Long term (current) use of anticoagulants: Secondary | ICD-10-CM

## 2016-02-03 LAB — POCT INR: INR: 2.4

## 2016-02-05 ENCOUNTER — Other Ambulatory Visit: Payer: Self-pay | Admitting: *Deleted

## 2016-02-05 MED ORDER — WARFARIN SODIUM 4 MG PO TABS: ORAL_TABLET | ORAL | Status: DC

## 2016-02-11 ENCOUNTER — Ambulatory Visit (INDEPENDENT_AMBULATORY_CARE_PROVIDER_SITE_OTHER): Payer: Medicare Other | Admitting: Internal Medicine

## 2016-02-11 DIAGNOSIS — Z5181 Encounter for therapeutic drug level monitoring: Secondary | ICD-10-CM

## 2016-02-11 DIAGNOSIS — Z7901 Long term (current) use of anticoagulants: Secondary | ICD-10-CM

## 2016-02-11 LAB — POCT INR: INR: 2.9

## 2016-02-13 DIAGNOSIS — D693 Immune thrombocytopenic purpura: Secondary | ICD-10-CM

## 2016-02-16 LAB — POCT INR: INR: 2.9

## 2016-02-17 ENCOUNTER — Ambulatory Visit (INDEPENDENT_AMBULATORY_CARE_PROVIDER_SITE_OTHER): Payer: Medicare Other | Admitting: Internal Medicine

## 2016-02-17 DIAGNOSIS — Z5181 Encounter for therapeutic drug level monitoring: Secondary | ICD-10-CM

## 2016-02-17 DIAGNOSIS — Z7901 Long term (current) use of anticoagulants: Secondary | ICD-10-CM

## 2016-02-24 ENCOUNTER — Ambulatory Visit (INDEPENDENT_AMBULATORY_CARE_PROVIDER_SITE_OTHER): Payer: Medicare Other | Admitting: Cardiovascular Disease

## 2016-02-24 DIAGNOSIS — Z5181 Encounter for therapeutic drug level monitoring: Secondary | ICD-10-CM

## 2016-02-24 DIAGNOSIS — Z7901 Long term (current) use of anticoagulants: Secondary | ICD-10-CM

## 2016-02-24 LAB — POCT INR: INR: 2.9

## 2016-03-03 ENCOUNTER — Ambulatory Visit (INDEPENDENT_AMBULATORY_CARE_PROVIDER_SITE_OTHER): Payer: Medicare Other | Admitting: Interventional Cardiology

## 2016-03-03 DIAGNOSIS — Z5181 Encounter for therapeutic drug level monitoring: Secondary | ICD-10-CM

## 2016-03-03 DIAGNOSIS — Z7901 Long term (current) use of anticoagulants: Secondary | ICD-10-CM

## 2016-03-03 LAB — POCT INR: INR: 2.8

## 2016-03-09 LAB — POCT INR: INR: 2.8

## 2016-03-10 ENCOUNTER — Ambulatory Visit (INDEPENDENT_AMBULATORY_CARE_PROVIDER_SITE_OTHER): Payer: Medicare Other | Admitting: Cardiology

## 2016-03-10 DIAGNOSIS — Z5181 Encounter for therapeutic drug level monitoring: Secondary | ICD-10-CM

## 2016-03-10 DIAGNOSIS — Z7901 Long term (current) use of anticoagulants: Secondary | ICD-10-CM

## 2016-03-16 ENCOUNTER — Ambulatory Visit (INDEPENDENT_AMBULATORY_CARE_PROVIDER_SITE_OTHER): Payer: Medicare Other | Admitting: Internal Medicine

## 2016-03-16 DIAGNOSIS — Z5181 Encounter for therapeutic drug level monitoring: Secondary | ICD-10-CM

## 2016-03-16 DIAGNOSIS — Z7901 Long term (current) use of anticoagulants: Secondary | ICD-10-CM

## 2016-03-16 LAB — POCT INR: INR: 2.7

## 2016-03-22 LAB — POCT INR: INR: 2.2

## 2016-03-23 ENCOUNTER — Ambulatory Visit (INDEPENDENT_AMBULATORY_CARE_PROVIDER_SITE_OTHER): Payer: Medicare Other | Admitting: Cardiology

## 2016-03-23 DIAGNOSIS — Z7901 Long term (current) use of anticoagulants: Secondary | ICD-10-CM

## 2016-03-23 DIAGNOSIS — Z5181 Encounter for therapeutic drug level monitoring: Secondary | ICD-10-CM

## 2016-03-29 LAB — POCT INR: INR: 2

## 2016-03-30 ENCOUNTER — Ambulatory Visit (INDEPENDENT_AMBULATORY_CARE_PROVIDER_SITE_OTHER): Payer: Medicare Other | Admitting: Cardiology

## 2016-03-30 DIAGNOSIS — Z7901 Long term (current) use of anticoagulants: Secondary | ICD-10-CM

## 2016-03-30 DIAGNOSIS — Z5181 Encounter for therapeutic drug level monitoring: Secondary | ICD-10-CM

## 2016-04-06 LAB — POCT INR: INR: 2.8

## 2016-04-07 ENCOUNTER — Ambulatory Visit (INDEPENDENT_AMBULATORY_CARE_PROVIDER_SITE_OTHER): Payer: Medicare Other | Admitting: Cardiovascular Disease

## 2016-04-07 DIAGNOSIS — Z5181 Encounter for therapeutic drug level monitoring: Secondary | ICD-10-CM

## 2016-04-07 DIAGNOSIS — Z7901 Long term (current) use of anticoagulants: Secondary | ICD-10-CM

## 2016-04-12 LAB — POCT INR: INR: 2.2

## 2016-04-13 ENCOUNTER — Ambulatory Visit (INDEPENDENT_AMBULATORY_CARE_PROVIDER_SITE_OTHER): Payer: Medicare Other | Admitting: Internal Medicine

## 2016-04-13 DIAGNOSIS — Z5181 Encounter for therapeutic drug level monitoring: Secondary | ICD-10-CM

## 2016-04-13 DIAGNOSIS — Z7901 Long term (current) use of anticoagulants: Secondary | ICD-10-CM

## 2016-04-19 ENCOUNTER — Ambulatory Visit (INDEPENDENT_AMBULATORY_CARE_PROVIDER_SITE_OTHER): Payer: Medicare Other | Admitting: Cardiology

## 2016-04-19 DIAGNOSIS — Z7901 Long term (current) use of anticoagulants: Secondary | ICD-10-CM

## 2016-04-19 DIAGNOSIS — Z5181 Encounter for therapeutic drug level monitoring: Secondary | ICD-10-CM

## 2016-04-19 LAB — POCT INR: INR: 2.9

## 2016-04-26 LAB — POCT INR: INR: 2.9

## 2016-04-27 ENCOUNTER — Ambulatory Visit (INDEPENDENT_AMBULATORY_CARE_PROVIDER_SITE_OTHER): Payer: Medicare Other | Admitting: Internal Medicine

## 2016-04-27 DIAGNOSIS — Z7901 Long term (current) use of anticoagulants: Secondary | ICD-10-CM

## 2016-04-27 DIAGNOSIS — Z5181 Encounter for therapeutic drug level monitoring: Secondary | ICD-10-CM

## 2016-05-03 ENCOUNTER — Ambulatory Visit (INDEPENDENT_AMBULATORY_CARE_PROVIDER_SITE_OTHER): Payer: Medicare Other | Admitting: Internal Medicine

## 2016-05-03 DIAGNOSIS — Z7901 Long term (current) use of anticoagulants: Secondary | ICD-10-CM

## 2016-05-03 DIAGNOSIS — Z5181 Encounter for therapeutic drug level monitoring: Secondary | ICD-10-CM

## 2016-05-03 LAB — POCT INR: INR: 2.9

## 2016-05-10 ENCOUNTER — Ambulatory Visit (INDEPENDENT_AMBULATORY_CARE_PROVIDER_SITE_OTHER): Payer: Medicare Other | Admitting: Internal Medicine

## 2016-05-10 DIAGNOSIS — Z5181 Encounter for therapeutic drug level monitoring: Secondary | ICD-10-CM

## 2016-05-10 DIAGNOSIS — Z7901 Long term (current) use of anticoagulants: Secondary | ICD-10-CM

## 2016-05-10 LAB — POCT INR: INR: 2.3

## 2016-05-17 LAB — POCT INR: INR: 2.9

## 2016-05-18 ENCOUNTER — Ambulatory Visit (INDEPENDENT_AMBULATORY_CARE_PROVIDER_SITE_OTHER): Payer: Medicare Other | Admitting: Cardiology

## 2016-05-18 DIAGNOSIS — Z7901 Long term (current) use of anticoagulants: Secondary | ICD-10-CM

## 2016-05-18 DIAGNOSIS — Z5181 Encounter for therapeutic drug level monitoring: Secondary | ICD-10-CM

## 2016-05-24 LAB — POCT INR: INR: 2.6

## 2016-05-25 ENCOUNTER — Ambulatory Visit (INDEPENDENT_AMBULATORY_CARE_PROVIDER_SITE_OTHER): Payer: Medicare Other | Admitting: Interventional Cardiology

## 2016-05-25 DIAGNOSIS — Z7901 Long term (current) use of anticoagulants: Secondary | ICD-10-CM

## 2016-05-25 DIAGNOSIS — Z5181 Encounter for therapeutic drug level monitoring: Secondary | ICD-10-CM

## 2016-05-26 ENCOUNTER — Telehealth: Payer: Self-pay | Admitting: Pharmacist

## 2016-05-26 NOTE — Telephone Encounter (Signed)
Received fax from [facility] that pt is scheduled for a colonoscopy on 06/15/16 with request to hold Coumadin. Pt takes Coumadin for afib with CHADS2 score of 2 (HTN, CHF). Ok to hold Coumadin x5 days. Clearance faxed [phone].

## 2016-05-31 LAB — POCT INR: INR: 2.4

## 2016-06-01 ENCOUNTER — Ambulatory Visit (INDEPENDENT_AMBULATORY_CARE_PROVIDER_SITE_OTHER): Payer: Medicare Other | Admitting: Interventional Cardiology

## 2016-06-01 DIAGNOSIS — Z5181 Encounter for therapeutic drug level monitoring: Secondary | ICD-10-CM

## 2016-06-01 DIAGNOSIS — Z7901 Long term (current) use of anticoagulants: Secondary | ICD-10-CM

## 2016-06-08 ENCOUNTER — Ambulatory Visit (INDEPENDENT_AMBULATORY_CARE_PROVIDER_SITE_OTHER): Payer: Medicare Other | Admitting: Interventional Cardiology

## 2016-06-08 DIAGNOSIS — Z5181 Encounter for therapeutic drug level monitoring: Secondary | ICD-10-CM

## 2016-06-08 DIAGNOSIS — Z7901 Long term (current) use of anticoagulants: Secondary | ICD-10-CM

## 2016-06-08 LAB — POCT INR: INR: 2.1

## 2016-06-21 ENCOUNTER — Ambulatory Visit (INDEPENDENT_AMBULATORY_CARE_PROVIDER_SITE_OTHER): Payer: Medicare Other | Admitting: Pharmacist

## 2016-06-21 DIAGNOSIS — Z5181 Encounter for therapeutic drug level monitoring: Secondary | ICD-10-CM

## 2016-06-21 DIAGNOSIS — Z7901 Long term (current) use of anticoagulants: Secondary | ICD-10-CM

## 2016-06-21 LAB — POCT INR: INR: 2.1

## 2016-06-29 LAB — POCT INR: INR: 2.6

## 2016-06-30 ENCOUNTER — Ambulatory Visit (INDEPENDENT_AMBULATORY_CARE_PROVIDER_SITE_OTHER): Payer: Medicare Other | Admitting: Cardiovascular Disease

## 2016-06-30 DIAGNOSIS — Z7901 Long term (current) use of anticoagulants: Secondary | ICD-10-CM

## 2016-06-30 DIAGNOSIS — Z5181 Encounter for therapeutic drug level monitoring: Secondary | ICD-10-CM

## 2016-07-05 LAB — POCT INR: INR: 2.8

## 2016-07-06 ENCOUNTER — Ambulatory Visit (INDEPENDENT_AMBULATORY_CARE_PROVIDER_SITE_OTHER): Payer: Medicare Other | Admitting: Interventional Cardiology

## 2016-07-06 DIAGNOSIS — Z5181 Encounter for therapeutic drug level monitoring: Secondary | ICD-10-CM

## 2016-07-06 DIAGNOSIS — Z7901 Long term (current) use of anticoagulants: Secondary | ICD-10-CM

## 2016-07-13 LAB — POCT INR: INR: 2.8

## 2016-07-14 ENCOUNTER — Ambulatory Visit (INDEPENDENT_AMBULATORY_CARE_PROVIDER_SITE_OTHER): Payer: Medicare Other | Admitting: Internal Medicine

## 2016-07-14 DIAGNOSIS — Z7901 Long term (current) use of anticoagulants: Secondary | ICD-10-CM

## 2016-07-14 DIAGNOSIS — Z5181 Encounter for therapeutic drug level monitoring: Secondary | ICD-10-CM

## 2016-07-21 ENCOUNTER — Other Ambulatory Visit: Payer: Self-pay | Admitting: Internal Medicine

## 2016-07-21 LAB — POCT INR: INR: 2

## 2016-07-22 ENCOUNTER — Ambulatory Visit (INDEPENDENT_AMBULATORY_CARE_PROVIDER_SITE_OTHER): Payer: Medicare Other | Admitting: Cardiovascular Disease

## 2016-07-22 DIAGNOSIS — Z7901 Long term (current) use of anticoagulants: Secondary | ICD-10-CM

## 2016-07-22 DIAGNOSIS — Z5181 Encounter for therapeutic drug level monitoring: Secondary | ICD-10-CM

## 2016-07-28 ENCOUNTER — Ambulatory Visit (INDEPENDENT_AMBULATORY_CARE_PROVIDER_SITE_OTHER): Payer: Medicare Other | Admitting: Cardiology

## 2016-07-28 DIAGNOSIS — Z5181 Encounter for therapeutic drug level monitoring: Secondary | ICD-10-CM

## 2016-07-28 DIAGNOSIS — Z7901 Long term (current) use of anticoagulants: Secondary | ICD-10-CM

## 2016-07-28 LAB — POCT INR: INR: 2

## 2016-08-04 LAB — POCT INR: INR: 2.9

## 2016-08-05 ENCOUNTER — Ambulatory Visit (INDEPENDENT_AMBULATORY_CARE_PROVIDER_SITE_OTHER): Payer: Medicare Other | Admitting: Cardiovascular Disease

## 2016-08-05 DIAGNOSIS — Z7901 Long term (current) use of anticoagulants: Secondary | ICD-10-CM

## 2016-08-05 DIAGNOSIS — Z5181 Encounter for therapeutic drug level monitoring: Secondary | ICD-10-CM

## 2016-08-12 LAB — POCT INR: INR: 2.5

## 2016-08-13 ENCOUNTER — Ambulatory Visit (INDEPENDENT_AMBULATORY_CARE_PROVIDER_SITE_OTHER): Payer: Medicare Other

## 2016-08-13 DIAGNOSIS — Z5181 Encounter for therapeutic drug level monitoring: Secondary | ICD-10-CM

## 2016-08-18 LAB — POCT INR: INR: 2.9

## 2016-08-19 ENCOUNTER — Ambulatory Visit (INDEPENDENT_AMBULATORY_CARE_PROVIDER_SITE_OTHER): Payer: Medicare Other

## 2016-08-19 DIAGNOSIS — Z5181 Encounter for therapeutic drug level monitoring: Secondary | ICD-10-CM

## 2016-08-25 LAB — POCT INR: INR: 2.3

## 2016-08-26 ENCOUNTER — Ambulatory Visit (INDEPENDENT_AMBULATORY_CARE_PROVIDER_SITE_OTHER): Payer: Medicare Other | Admitting: Internal Medicine

## 2016-08-26 DIAGNOSIS — Z5181 Encounter for therapeutic drug level monitoring: Secondary | ICD-10-CM

## 2016-09-02 ENCOUNTER — Ambulatory Visit (INDEPENDENT_AMBULATORY_CARE_PROVIDER_SITE_OTHER): Payer: Medicare Other | Admitting: Cardiovascular Disease

## 2016-09-02 DIAGNOSIS — Z5181 Encounter for therapeutic drug level monitoring: Secondary | ICD-10-CM

## 2016-09-02 LAB — POCT INR: INR: 2.5

## 2016-09-09 LAB — POCT INR: INR: 2.2

## 2016-09-10 ENCOUNTER — Ambulatory Visit (INDEPENDENT_AMBULATORY_CARE_PROVIDER_SITE_OTHER): Payer: Medicare Other | Admitting: Cardiology

## 2016-09-10 ENCOUNTER — Telehealth: Payer: Self-pay | Admitting: Internal Medicine

## 2016-09-10 DIAGNOSIS — Z5181 Encounter for therapeutic drug level monitoring: Secondary | ICD-10-CM

## 2016-09-10 NOTE — Telephone Encounter (Signed)
Spoke with pt, please refer to antiocoagulation track for details

## 2016-09-10 NOTE — Telephone Encounter (Signed)
New message  Pt call requesting to speak with RN to give INR 2.2. Please call back if needed to discuss.

## 2016-09-10 NOTE — Telephone Encounter (Signed)
Returned a call to pt & there was no answer will try again.

## 2016-09-17 ENCOUNTER — Ambulatory Visit (INDEPENDENT_AMBULATORY_CARE_PROVIDER_SITE_OTHER): Payer: Medicare Other | Admitting: Internal Medicine

## 2016-09-17 DIAGNOSIS — Z5181 Encounter for therapeutic drug level monitoring: Secondary | ICD-10-CM

## 2016-09-17 LAB — POCT INR: INR: 2.1

## 2016-09-25 LAB — POCT INR: INR: 2.4

## 2016-09-27 ENCOUNTER — Ambulatory Visit (INDEPENDENT_AMBULATORY_CARE_PROVIDER_SITE_OTHER): Payer: Medicare Other | Admitting: Internal Medicine

## 2016-09-27 DIAGNOSIS — Z5181 Encounter for therapeutic drug level monitoring: Secondary | ICD-10-CM

## 2016-10-03 LAB — POCT INR: INR: 2.9

## 2016-10-04 ENCOUNTER — Ambulatory Visit (INDEPENDENT_AMBULATORY_CARE_PROVIDER_SITE_OTHER): Payer: Medicare Other | Admitting: Cardiovascular Disease

## 2016-10-04 DIAGNOSIS — Z5181 Encounter for therapeutic drug level monitoring: Secondary | ICD-10-CM

## 2016-10-11 ENCOUNTER — Ambulatory Visit (INDEPENDENT_AMBULATORY_CARE_PROVIDER_SITE_OTHER): Payer: Medicare Other | Admitting: Cardiovascular Disease

## 2016-10-11 DIAGNOSIS — Z5181 Encounter for therapeutic drug level monitoring: Secondary | ICD-10-CM

## 2016-10-11 LAB — POCT INR: INR: 3.6

## 2016-10-18 LAB — POCT INR: INR: 2.2

## 2016-10-19 ENCOUNTER — Ambulatory Visit (INDEPENDENT_AMBULATORY_CARE_PROVIDER_SITE_OTHER): Payer: Medicare Other | Admitting: Cardiology

## 2016-10-19 DIAGNOSIS — Z5181 Encounter for therapeutic drug level monitoring: Secondary | ICD-10-CM

## 2016-10-26 ENCOUNTER — Ambulatory Visit (INDEPENDENT_AMBULATORY_CARE_PROVIDER_SITE_OTHER): Payer: Medicare Other | Admitting: Interventional Cardiology

## 2016-10-26 DIAGNOSIS — Z5181 Encounter for therapeutic drug level monitoring: Secondary | ICD-10-CM

## 2016-10-26 LAB — POCT INR: INR: 2.9

## 2016-10-28 ENCOUNTER — Ambulatory Visit (INDEPENDENT_AMBULATORY_CARE_PROVIDER_SITE_OTHER): Payer: Medicare Other | Admitting: Internal Medicine

## 2016-10-28 ENCOUNTER — Encounter (INDEPENDENT_AMBULATORY_CARE_PROVIDER_SITE_OTHER): Payer: Self-pay

## 2016-10-28 ENCOUNTER — Encounter: Payer: Self-pay | Admitting: Internal Medicine

## 2016-10-28 VITALS — BP 120/60 | HR 80 | Ht 70.0 in | Wt 206.4 lb

## 2016-10-28 DIAGNOSIS — I5042 Chronic combined systolic (congestive) and diastolic (congestive) heart failure: Secondary | ICD-10-CM | POA: Diagnosis not present

## 2016-10-28 DIAGNOSIS — I482 Chronic atrial fibrillation: Secondary | ICD-10-CM

## 2016-10-28 DIAGNOSIS — I4821 Permanent atrial fibrillation: Secondary | ICD-10-CM

## 2016-10-28 NOTE — Patient Instructions (Signed)
Medication Instructions: - Your physician has recommended you make the following change in your medication:  1) hold your atorvastatin for about 2 weeks to see if you notice any change in your symptoms  Labwork: - none ordered  Procedures/Testing: - none ordered  Follow-Up: - Your physician wants you to follow-up in: 1 year with Dr. [name]. You will receive a reminder letter in the mail two months in advance. If you don't receive a letter, please call our office to schedule the follow-up appointment.  Any Additional Special Instructions Will Be Listed Below (If Applicable).     If you need a refill on your cardiac medications before your next appointment, please call your pharmacy.

## 2016-10-28 NOTE — Progress Notes (Signed)
Patient Care Team: [name], MD as PCP - General (Family Medicine) Deboraha Sprang, MD as Consulting Physician (Cardiology) Jamal Maes, MD as Consulting Physician (Nephrology)   HPI  [name] is a 65 y.o. male seen in followup for permanent atrial fibrillation in the context of CAD with prior CABG and EF 41%;  he has a history of kidney and liver transplant 2008 and is followed at [facility]  When last having been seen 9/16 is completed been shortness of breath. He got better with increasing diuresis. Over recent days it has worsened again. He has some degree of volume overload manifested by edema.  He also notes a reproducible left shoulder discomfort associated with exertion particularly heavy exertion resolving rapidly with rest.   He is now sleeping on just one -pillow  . He denies abdominal distention. There is been no change in his diet. He denies exertional chest discomfort.  Over the last few weeks, he has had problems with pleuritic chest pain. While the pleurisy has abated, his shortness of breath remains a problem  He also complains of myalgias which he worries may be secondary to his statin  Records and Results Reviewed -[facility]  6/16  HR excursion quite broad and carvidolol doubled DATE TEST    1/16 Myoview     EF41   anterolateral and inferior scars   9/16 echo EF 45-50 Reduced RVSF/Mod LAE    Last catheterization report was 2008 at  [facility] was reviewed Echo 08/07/15    Myoview 11/19/14 Anterolateral and posterolateral scar, no ischemia, EF 41%   Past Medical History:  Diagnosis Date  . Atrial fibrillation ([place]) 10/10  . CAD (coronary artery disease)    s/p CABG -- 1992  . ESRD (end stage renal disease) ([place])   . HLD (hyperlipidemia)   . HTN (hypertension)   . Idiopathic thrombocytopenia ([facility])     Past Surgical History:  Procedure Laterality Date  . APPENDECTOMY    . CORONARY ARTERY BYPASS GRAFT    . FACIAL COSMETIC SURGERY    .  GALLBLADDER SURGERY    . HERNIA REPAIR    . KIDNEY TRANSPLANT    . LIVER TRANSPLANT      Current Outpatient Prescriptions  Medication Sig Dispense Refill  . allopurinol (ZYLOPRIM) 100 MG tablet Take 100 mg by mouth daily.    [name] atorvastatin (LIPITOR) 10 MG tablet Take 1 tablet (10 mg total) by mouth daily. 90 tablet 3  . carvedilol (COREG) 12.5 MG tablet TAKE 1 TABLET BY MOUTH TWICE DAILY. 60 tablet 10  . DULoxetine (CYMBALTA) 60 MG capsule Take 60 mg by mouth at bedtime.    [name] EPINEPHrine (EPIPEN) 0.3 mg/0.3 mL DEVI Inject 0.3 mg into the muscle as needed.      . fish oil-omega-3 fatty acids 1000 MG capsule Take 4 g by mouth daily.      . furosemide (LASIX) 40 MG tablet Take one tablet (40 mg) by mouth once daily    . predniSONE (DELTASONE) 5 MG tablet Take 5 mg by mouth daily.      . QUEtiapine (SEROQUEL) 100 MG tablet Take 100 mg by mouth at bedtime.    . tacrolimus (PROGRAF) 0.5 MG capsule Take two capsules (1 mg) by mouth twice every other day alternating with two capsules (1 mg) once every other day    . traZODone (DESYREL) 150 MG tablet Take 150 mg by mouth at bedtime.    [name] warfarin (COUMADIN) 4  MG tablet TAKE AS DIRECTED BY COUMADIN CLINIC. 40 tablet 3   No current facility-administered medications for this visit.     Allergies  Allergen Reactions  . Haloperidol Other (See Comments)  . Nsaids Other (See Comments)  . Rifampin Other (See Comments)  . Triazolam Other (See Comments)      Review of Systems negative except from HPI and PMH  Physical Exam BP 120/60   Pulse 80   Ht 5\' 10"  (1.778 m)   Wt 206 lb 6.4 oz (93.6 kg)   SpO2 90%   BMI 29.62 kg/m  Well developed and well nourished in no acute distress HENT normal E scleral and icterus clear Neck Supple JVP 8 cm with positive HJR; carotids brisk and full Clear to ausculation Left sided pleuritic pain  Irregularly irregular rate and rhythm, 2/6 systolic murmur  gallops or rub Soft with active bowel sounds No  clubbing cyanosis 1+ Edema Alert and oriented, grossly normal motor and sensory function Skin Warm and Dry  ECG was reviewed today demonstrated atrial fibrillation at 82 Intervals-/11/39 Axis 75 Prior inferior wall MI Prior anterior wall \\MI    Assessment and  Plan  Atrial fibrillation permanent  CAD  S/p CABG  Congestive heart failure-acute on chronic-systolic/diastolic  Status post renal transplant\\  Pleuritic pain  Myalgias   I'm not sure of the cause of his pleuritic pain. I have reached out to his transplant doctor at [facility] so as to get the most direct course to evaluate this.  He has suggested a chest x-ray. He will follow-up his labs with the patient.  His myalgias he worries about coming from his statin; I suggested that he stop his statin for a couple of weeks and see if the symptoms abate.  Volume status appears relatively stable.  Without symptoms of ischemia

## 2016-11-03 LAB — POCT INR: INR: 2.9

## 2016-11-04 ENCOUNTER — Ambulatory Visit (INDEPENDENT_AMBULATORY_CARE_PROVIDER_SITE_OTHER): Payer: Medicare Other | Admitting: Internal Medicine

## 2016-11-04 DIAGNOSIS — Z5181 Encounter for therapeutic drug level monitoring: Secondary | ICD-10-CM

## 2016-11-11 ENCOUNTER — Ambulatory Visit (INDEPENDENT_AMBULATORY_CARE_PROVIDER_SITE_OTHER): Payer: Medicare Other | Admitting: Cardiovascular Disease

## 2016-11-11 DIAGNOSIS — Z5181 Encounter for therapeutic drug level monitoring: Secondary | ICD-10-CM

## 2016-11-11 LAB — POCT INR: INR: 2.9

## 2016-11-17 ENCOUNTER — Other Ambulatory Visit: Payer: Self-pay | Admitting: Internal Medicine

## 2016-11-19 ENCOUNTER — Ambulatory Visit (INDEPENDENT_AMBULATORY_CARE_PROVIDER_SITE_OTHER): Payer: Medicare Other | Admitting: Pharmacist

## 2016-11-19 DIAGNOSIS — Z5181 Encounter for therapeutic drug level monitoring: Secondary | ICD-10-CM

## 2016-11-19 LAB — POCT INR: INR: 2.1

## 2016-11-22 DIAGNOSIS — R109 Unspecified abdominal pain: Secondary | ICD-10-CM | POA: Diagnosis not present

## 2016-11-22 DIAGNOSIS — R69 Illness, unspecified: Secondary | ICD-10-CM

## 2016-11-22 DIAGNOSIS — Z944 Liver transplant status: Secondary | ICD-10-CM

## 2016-11-22 DIAGNOSIS — R0600 Dyspnea, unspecified: Secondary | ICD-10-CM

## 2016-11-22 DIAGNOSIS — Z940 Kidney transplant status: Secondary | ICD-10-CM

## 2016-11-22 DIAGNOSIS — D849 Immunodeficiency, unspecified: Secondary | ICD-10-CM | POA: Diagnosis not present

## 2016-11-22 DIAGNOSIS — I4891 Unspecified atrial fibrillation: Secondary | ICD-10-CM | POA: Diagnosis not present

## 2016-11-23 DIAGNOSIS — I4891 Unspecified atrial fibrillation: Secondary | ICD-10-CM | POA: Diagnosis not present

## 2016-11-23 DIAGNOSIS — D849 Immunodeficiency, unspecified: Secondary | ICD-10-CM | POA: Diagnosis not present

## 2016-11-23 DIAGNOSIS — R0600 Dyspnea, unspecified: Secondary | ICD-10-CM | POA: Diagnosis not present

## 2016-11-23 DIAGNOSIS — R109 Unspecified abdominal pain: Secondary | ICD-10-CM | POA: Diagnosis not present

## 2016-11-24 DIAGNOSIS — I4891 Unspecified atrial fibrillation: Secondary | ICD-10-CM | POA: Diagnosis not present

## 2016-11-24 DIAGNOSIS — D849 Immunodeficiency, unspecified: Secondary | ICD-10-CM | POA: Diagnosis not present

## 2016-11-24 DIAGNOSIS — R0600 Dyspnea, unspecified: Secondary | ICD-10-CM | POA: Diagnosis not present

## 2016-11-24 DIAGNOSIS — R109 Unspecified abdominal pain: Secondary | ICD-10-CM | POA: Diagnosis not present

## 2016-11-25 LAB — POCT INR: INR: 2.9

## 2016-11-26 ENCOUNTER — Ambulatory Visit (INDEPENDENT_AMBULATORY_CARE_PROVIDER_SITE_OTHER): Payer: Medicare Other | Admitting: Cardiology

## 2016-11-26 DIAGNOSIS — Z5181 Encounter for therapeutic drug level monitoring: Secondary | ICD-10-CM

## 2016-12-03 ENCOUNTER — Ambulatory Visit (INDEPENDENT_AMBULATORY_CARE_PROVIDER_SITE_OTHER): Payer: Medicare Other | Admitting: Internal Medicine

## 2016-12-03 DIAGNOSIS — Z5181 Encounter for therapeutic drug level monitoring: Secondary | ICD-10-CM

## 2016-12-03 LAB — POCT INR: INR: 2.9

## 2016-12-10 ENCOUNTER — Ambulatory Visit (INDEPENDENT_AMBULATORY_CARE_PROVIDER_SITE_OTHER): Payer: Medicare Other | Admitting: Interventional Cardiology

## 2016-12-10 DIAGNOSIS — Z5181 Encounter for therapeutic drug level monitoring: Secondary | ICD-10-CM

## 2016-12-10 LAB — POCT INR: INR: 2.9

## 2016-12-10 LAB — PROTIME-INR

## 2016-12-16 ENCOUNTER — Encounter: Payer: Self-pay | Admitting: Internal Medicine

## 2016-12-17 ENCOUNTER — Encounter (HOSPITAL_COMMUNITY): Payer: Self-pay | Admitting: Vascular Surgery

## 2016-12-17 ENCOUNTER — Emergency Department (HOSPITAL_COMMUNITY): Payer: Medicare Other

## 2016-12-17 ENCOUNTER — Inpatient Hospital Stay (HOSPITAL_COMMUNITY)
Admission: EM | Admit: 2016-12-17 | Discharge: 2016-12-20 | DRG: 291 | Disposition: A | Discharge status: Home / Self Care | Payer: Medicare Other | Attending: Internal Medicine | Admitting: Internal Medicine

## 2016-12-17 DIAGNOSIS — I252 Old myocardial infarction: Secondary | ICD-10-CM

## 2016-12-17 DIAGNOSIS — Z7901 Long term (current) use of anticoagulants: Secondary | ICD-10-CM | POA: Diagnosis not present

## 2016-12-17 DIAGNOSIS — J9601 Acute respiratory failure with hypoxia: Secondary | ICD-10-CM | POA: Diagnosis present

## 2016-12-17 DIAGNOSIS — Z944 Liver transplant status: Secondary | ICD-10-CM | POA: Diagnosis not present

## 2016-12-17 DIAGNOSIS — R079 Chest pain, unspecified: Secondary | ICD-10-CM

## 2016-12-17 DIAGNOSIS — I132 Hypertensive heart and chronic kidney disease with heart failure and with stage 5 chronic kidney disease, or end stage renal disease: Secondary | ICD-10-CM | POA: Diagnosis present

## 2016-12-17 DIAGNOSIS — I10 Essential (primary) hypertension: Secondary | ICD-10-CM | POA: Diagnosis present

## 2016-12-17 DIAGNOSIS — I482 Chronic atrial fibrillation, unspecified: Secondary | ICD-10-CM

## 2016-12-17 DIAGNOSIS — Z7952 Long term (current) use of systemic steroids: Secondary | ICD-10-CM | POA: Diagnosis not present

## 2016-12-17 DIAGNOSIS — Z862 Personal history of diseases of the blood and blood-forming organs and certain disorders involving the immune mechanism: Secondary | ICD-10-CM | POA: Diagnosis not present

## 2016-12-17 DIAGNOSIS — M109 Gout, unspecified: Secondary | ICD-10-CM | POA: Diagnosis present

## 2016-12-17 DIAGNOSIS — N183 Chronic kidney disease, stage 3 unspecified: Secondary | ICD-10-CM | POA: Diagnosis present

## 2016-12-17 DIAGNOSIS — N179 Acute kidney failure, unspecified: Secondary | ICD-10-CM | POA: Diagnosis present

## 2016-12-17 DIAGNOSIS — I130 Hypertensive heart and chronic kidney disease with heart failure and stage 1 through stage 4 chronic kidney disease, or unspecified chronic kidney disease: Principal | ICD-10-CM | POA: Diagnosis present

## 2016-12-17 DIAGNOSIS — I509 Heart failure, unspecified: Secondary | ICD-10-CM | POA: Diagnosis not present

## 2016-12-17 DIAGNOSIS — Z79899 Other long term (current) drug therapy: Secondary | ICD-10-CM

## 2016-12-17 DIAGNOSIS — I5023 Acute on chronic systolic (congestive) heart failure: Secondary | ICD-10-CM | POA: Diagnosis present

## 2016-12-17 DIAGNOSIS — Z888 Allergy status to other drugs, medicaments and biological substances status: Secondary | ICD-10-CM

## 2016-12-17 DIAGNOSIS — I25110 Atherosclerotic heart disease of native coronary artery with unstable angina pectoris: Secondary | ICD-10-CM | POA: Diagnosis present

## 2016-12-17 DIAGNOSIS — Z951 Presence of aortocoronary bypass graft: Secondary | ICD-10-CM

## 2016-12-17 DIAGNOSIS — B349 Viral infection, unspecified: Secondary | ICD-10-CM | POA: Diagnosis present

## 2016-12-17 DIAGNOSIS — I159 Secondary hypertension, unspecified: Secondary | ICD-10-CM | POA: Diagnosis present

## 2016-12-17 DIAGNOSIS — I959 Hypotension, unspecified: Secondary | ICD-10-CM | POA: Diagnosis present

## 2016-12-17 DIAGNOSIS — R0602 Shortness of breath: Secondary | ICD-10-CM

## 2016-12-17 DIAGNOSIS — Z886 Allergy status to analgesic agent status: Secondary | ICD-10-CM

## 2016-12-17 DIAGNOSIS — I2510 Atherosclerotic heart disease of native coronary artery without angina pectoris: Secondary | ICD-10-CM | POA: Diagnosis present

## 2016-12-17 DIAGNOSIS — Z940 Kidney transplant status: Secondary | ICD-10-CM

## 2016-12-17 DIAGNOSIS — Z66 Do not resuscitate: Secondary | ICD-10-CM | POA: Diagnosis present

## 2016-12-17 DIAGNOSIS — F329 Major depressive disorder, single episode, unspecified: Secondary | ICD-10-CM | POA: Diagnosis present

## 2016-12-17 DIAGNOSIS — E785 Hyperlipidemia, unspecified: Secondary | ICD-10-CM | POA: Diagnosis present

## 2016-12-17 DIAGNOSIS — R0789 Other chest pain: Secondary | ICD-10-CM | POA: Diagnosis not present

## 2016-12-17 DIAGNOSIS — A419 Sepsis, unspecified organism: Secondary | ICD-10-CM | POA: Diagnosis present

## 2016-12-17 DIAGNOSIS — Z8249 Family history of ischemic heart disease and other diseases of the circulatory system: Secondary | ICD-10-CM

## 2016-12-17 DIAGNOSIS — I5022 Chronic systolic (congestive) heart failure: Secondary | ICD-10-CM | POA: Diagnosis not present

## 2016-12-17 DIAGNOSIS — D693 Immune thrombocytopenic purpura: Secondary | ICD-10-CM | POA: Diagnosis present

## 2016-12-17 DIAGNOSIS — I209 Angina pectoris, unspecified: Secondary | ICD-10-CM | POA: Diagnosis not present

## 2016-12-17 DIAGNOSIS — R071 Chest pain on breathing: Secondary | ICD-10-CM | POA: Diagnosis not present

## 2016-12-17 DIAGNOSIS — Z8051 Family history of malignant neoplasm of kidney: Secondary | ICD-10-CM

## 2016-12-17 DIAGNOSIS — I200 Unstable angina: Secondary | ICD-10-CM

## 2016-12-17 DIAGNOSIS — I4891 Unspecified atrial fibrillation: Secondary | ICD-10-CM | POA: Diagnosis present

## 2016-12-17 DIAGNOSIS — Z91018 Allergy to other foods: Secondary | ICD-10-CM

## 2016-12-17 DIAGNOSIS — I481 Persistent atrial fibrillation: Secondary | ICD-10-CM | POA: Diagnosis present

## 2016-12-17 LAB — URINALYSIS, ROUTINE W REFLEX MICROSCOPIC
Bacteria, UA: NONE SEEN
Bilirubin Urine: NEGATIVE
Glucose, UA: NEGATIVE mg/dL
Ketones, ur: NEGATIVE mg/dL
Leukocytes, UA: NEGATIVE
Nitrite: NEGATIVE
Protein, ur: NEGATIVE mg/dL
Specific Gravity, Urine: 1.006 (ref 1.005–1.030)
Squamous Epithelial / LPF: NONE SEEN
pH: 5 (ref 5.0–8.0)

## 2016-12-17 LAB — CBC WITH DIFFERENTIAL/PLATELET
BASOS ABS: 0 10*3/uL (ref 0.0–0.1)
BASOS PCT: 0 %
EOS ABS: 0.1 10*3/uL (ref 0.0–0.7)
Eosinophils Relative: 2 %
HCT: 38.5 % — ABNORMAL LOW (ref 39.0–52.0)
Hemoglobin: 12.3 g/dL — ABNORMAL LOW (ref 13.0–17.0)
Lymphocytes Relative: 5 %
Lymphs Abs: 0.4 10*3/uL — ABNORMAL LOW (ref 0.7–4.0)
MCH: 26.1 pg (ref 26.0–34.0)
MCHC: 31.9 g/dL (ref 30.0–36.0)
MCV: 81.6 fL (ref 78.0–100.0)
MONO ABS: 0.4 10*3/uL (ref 0.1–1.0)
MONOS PCT: 5 %
Neutro Abs: 6.9 10*3/uL (ref 1.7–7.7)
Neutrophils Relative %: 88 %
PLATELETS: 77 10*3/uL — AB (ref 150–400)
RBC: 4.72 MIL/uL (ref 4.22–5.81)
RDW: 16 % — AB (ref 11.5–15.5)
WBC: 7.8 10*3/uL (ref 4.0–10.5)

## 2016-12-17 LAB — COMPREHENSIVE METABOLIC PANEL
ALBUMIN: 3.8 g/dL (ref 3.5–5.0)
ALK PHOS: 55 U/L (ref 38–126)
ALT: 16 U/L — AB (ref 17–63)
AST: 22 U/L (ref 15–41)
Anion gap: 12 (ref 5–15)
BILIRUBIN TOTAL: 1.8 mg/dL — AB (ref 0.3–1.2)
BUN: 16 mg/dL (ref 6–20)
CALCIUM: 9.8 mg/dL (ref 8.9–10.3)
CO2: 24 mmol/L (ref 22–32)
CREATININE: 1.47 mg/dL — AB (ref 0.61–1.24)
Chloride: 102 mmol/L (ref 101–111)
GFR calc non Af Amer: 49 mL/min — ABNORMAL LOW (ref >60)
GFR, EST AFRICAN AMERICAN: 57 mL/min — AB (ref >60)
GLUCOSE: 121 mg/dL — AB (ref 65–99)
Potassium: 4.2 mmol/L (ref 3.5–5.1)
SODIUM: 138 mmol/L (ref 135–145)
TOTAL PROTEIN: 7 g/dL (ref 6.5–8.1)

## 2016-12-17 LAB — PROTIME-INR
INR: 2.03
Prothrombin Time: 23.2 seconds — ABNORMAL HIGH (ref 11.4–15.2)

## 2016-12-17 LAB — INFLUENZA PANEL BY PCR (TYPE A & B)
Influenza A By PCR: NEGATIVE
Influenza B By PCR: NEGATIVE

## 2016-12-17 LAB — I-STAT TROPONIN, ED: Troponin i, poc: 0.02 ng/mL (ref 0.00–0.08)

## 2016-12-17 LAB — I-STAT CG4 LACTIC ACID, ED: Lactic Acid, Venous: 1.24 mmol/L (ref 0.5–1.9)

## 2016-12-17 MED ORDER — SODIUM CHLORIDE 0.9 % IV BOLUS (SEPSIS)
1000.0000 mL | Freq: Once | INTRAVENOUS | Status: AC
  Administered 2016-12-17: 1000 mL via INTRAVENOUS

## 2016-12-17 MED ORDER — DULOXETINE HCL 60 MG PO CPEP
60.0000 mg | ORAL_CAPSULE | Freq: Every day | ORAL | Status: DC
  Administered 2016-12-17 – 2016-12-19 (×3): 60 mg via ORAL
  Filled 2016-12-17 (×3): qty 1

## 2016-12-17 MED ORDER — HEPARIN (PORCINE) IN NACL 100-0.45 UNIT/ML-% IJ SOLN
900.0000 [IU]/h | INTRAMUSCULAR | Status: DC
  Administered 2016-12-17: 900 [IU]/h via INTRAVENOUS
  Filled 2016-12-17: qty 250

## 2016-12-17 MED ORDER — NITROGLYCERIN 0.4 MG SL SUBL: 0.4000 mg | SUBLINGUAL_TABLET | SUBLINGUAL | Status: DC | PRN

## 2016-12-17 MED ORDER — ACETAMINOPHEN 325 MG PO TABS: 650.0000 mg | ORAL_TABLET | Freq: Four times a day (QID) | ORAL | Status: DC | PRN

## 2016-12-17 MED ORDER — DEXTROSE 5 % IV SOLN
500.0000 mg | Freq: Once | INTRAVENOUS | Status: AC
  Administered 2016-12-17: 500 mg via INTRAVENOUS
  Filled 2016-12-17: qty 500

## 2016-12-17 MED ORDER — ONDANSETRON HCL 4 MG/2ML IJ SOLN
4.0000 mg | Freq: Four times a day (QID) | INTRAMUSCULAR | Status: DC | PRN
  Administered 2016-12-18: 4 mg via INTRAVENOUS
  Filled 2016-12-17: qty 2

## 2016-12-17 MED ORDER — FERROUS SULFATE 325 (65 FE) MG PO TABS
325.0000 mg | ORAL_TABLET | Freq: Every day | ORAL | Status: DC
  Administered 2016-12-18 – 2016-12-20 (×3): 325 mg via ORAL
  Filled 2016-12-17 (×4): qty 1

## 2016-12-17 MED ORDER — PREDNISONE 5 MG PO TABS
5.0000 mg | ORAL_TABLET | Freq: Every day | ORAL | Status: DC
  Administered 2016-12-18: 5 mg via ORAL
  Filled 2016-12-17: qty 1

## 2016-12-17 MED ORDER — FUROSEMIDE 10 MG/ML IJ SOLN
40.0000 mg | Freq: Once | INTRAMUSCULAR | Status: AC
  Administered 2016-12-18: 40 mg via INTRAVENOUS
  Filled 2016-12-17: qty 4

## 2016-12-17 MED ORDER — WARFARIN SODIUM 5 MG PO TABS: 6.0000 mg | ORAL_TABLET | ORAL | Status: DC

## 2016-12-17 MED ORDER — DEXTROSE 5 % IV SOLN: 1.0000 g | INTRAVENOUS | Status: DC

## 2016-12-17 MED ORDER — VITAMIN B-12 100 MCG PO TABS
100.0000 ug | ORAL_TABLET | Freq: Every day | ORAL | Status: DC
  Administered 2016-12-18 – 2016-12-20 (×3): 100 ug via ORAL
  Filled 2016-12-17 (×3): qty 1

## 2016-12-17 MED ORDER — FUROSEMIDE 40 MG PO TABS
40.0000 mg | ORAL_TABLET | Freq: Every day | ORAL | Status: DC
  Administered 2016-12-18: 40 mg via ORAL
  Filled 2016-12-17: qty 1

## 2016-12-17 MED ORDER — DEXTROSE 5 % IV SOLN
1.0000 g | Freq: Once | INTRAVENOUS | Status: AC
  Administered 2016-12-17: 1 g via INTRAVENOUS
  Filled 2016-12-17: qty 10

## 2016-12-17 MED ORDER — QUETIAPINE FUMARATE 50 MG PO TABS
100.0000 mg | ORAL_TABLET | Freq: Every day | ORAL | Status: DC
  Administered 2016-12-17 – 2016-12-19 (×3): 100 mg via ORAL
  Filled 2016-12-17 (×2): qty 2
  Filled 2016-12-17: qty 1

## 2016-12-17 MED ORDER — TACROLIMUS 1 MG PO CAPS
1.0000 mg | ORAL_CAPSULE | Freq: Two times a day (BID) | ORAL | Status: DC
  Administered 2016-12-17 – 2016-12-20 (×6): 1 mg via ORAL
  Filled 2016-12-17 (×6): qty 1

## 2016-12-17 MED ORDER — DEXTROSE 5 % IV SOLN
500.0000 mg | INTRAVENOUS | Status: DC
  Administered 2016-12-18: 500 mg via INTRAVENOUS
  Filled 2016-12-17 (×2): qty 500

## 2016-12-17 MED ORDER — EPINEPHRINE 0.3 MG/0.3ML IJ DEVI: 0.3000 mg | Freq: Once | INTRAMUSCULAR | Status: DC | PRN

## 2016-12-17 MED ORDER — MELATONIN 3 MG PO TABS
3.0000 mg | ORAL_TABLET | Freq: Every day | ORAL | Status: DC
  Administered 2016-12-17 – 2016-12-19 (×3): 3 mg via ORAL
  Filled 2016-12-17 (×3): qty 1

## 2016-12-17 MED ORDER — WARFARIN SODIUM 4 MG PO TABS
4.0000 mg | ORAL_TABLET | ORAL | Status: DC
  Administered 2016-12-18 (×2): 4 mg via ORAL
  Filled 2016-12-17 (×2): qty 1

## 2016-12-17 MED ORDER — WARFARIN - PHARMACIST DOSING INPATIENT
Freq: Every day | Status: DC
  Administered 2016-12-18: 18:00:00

## 2016-12-17 MED ORDER — OMEGA-3 FATTY ACIDS 1000 MG PO CAPS: 2.0000 g | ORAL_CAPSULE | Freq: Two times a day (BID) | ORAL | Status: DC

## 2016-12-17 MED ORDER — ATORVASTATIN CALCIUM 10 MG PO TABS
10.0000 mg | ORAL_TABLET | Freq: Every day | ORAL | Status: DC
  Administered 2016-12-18 – 2016-12-20 (×3): 10 mg via ORAL
  Filled 2016-12-17 (×3): qty 1

## 2016-12-17 MED ORDER — ALBUTEROL SULFATE (2.5 MG/3ML) 0.083% IN NEBU: 2.5000 mg | INHALATION_SOLUTION | RESPIRATORY_TRACT | Status: DC | PRN

## 2016-12-17 MED ORDER — TRAZODONE HCL 50 MG PO TABS
150.0000 mg | ORAL_TABLET | Freq: Every day | ORAL | Status: DC
  Administered 2016-12-17 – 2016-12-19 (×3): 150 mg via ORAL
  Filled 2016-12-17 (×3): qty 1

## 2016-12-17 MED ORDER — CARVEDILOL 12.5 MG PO TABS
12.5000 mg | ORAL_TABLET | Freq: Two times a day (BID) | ORAL | Status: DC
  Administered 2016-12-18: 12.5 mg via ORAL
  Filled 2016-12-17 (×2): qty 1

## 2016-12-17 MED ORDER — ALLOPURINOL 100 MG PO TABS
100.0000 mg | ORAL_TABLET | Freq: Every day | ORAL | Status: DC
Start: 2016-12-18 — End: 2016-12-20
  Administered 2016-12-18 – 2016-12-20 (×3): 100 mg via ORAL
  Filled 2016-12-17 (×3): qty 1

## 2016-12-17 MED ORDER — METOPROLOL TARTRATE 5 MG/5ML IV SOLN
2.5000 mg | Freq: Once | INTRAVENOUS | Status: AC
  Administered 2016-12-17: 2.5 mg via INTRAVENOUS
  Filled 2016-12-17: qty 5

## 2016-12-17 MED ORDER — ZOLPIDEM TARTRATE 5 MG PO TABS
5.0000 mg | ORAL_TABLET | Freq: Every evening | ORAL | Status: DC | PRN
Start: 2016-12-17 — End: 2016-12-20

## 2016-12-17 MED ORDER — OMEGA-3-ACID ETHYL ESTERS 1 G PO CAPS
2.0000 g | ORAL_CAPSULE | Freq: Two times a day (BID) | ORAL | Status: DC
  Administered 2016-12-17 – 2016-12-20 (×6): 2 g via ORAL
  Filled 2016-12-17 (×6): qty 2

## 2016-12-17 MED ORDER — SODIUM CHLORIDE 0.9 % IV SOLN
INTRAVENOUS | Status: DC
  Administered 2016-12-17: 250 mL via INTRAVENOUS

## 2016-12-17 MED ORDER — ACETAMINOPHEN 500 MG PO TABS
500.0000 mg | ORAL_TABLET | Freq: Once | ORAL | Status: AC
  Administered 2016-12-17: 500 mg via ORAL
  Filled 2016-12-17: qty 1

## 2016-12-17 NOTE — ED Notes (Addendum)
[place] RN attempted x 1 and this RN x 2 failed IV attempts

## 2016-12-17 NOTE — ED Notes (Signed)
[facility] called and asked about antibiotics. Dr. Darl Householder called and informed of this request. He states he will look at the chest X-ray and order appropriate abx.

## 2016-12-17 NOTE — ED Triage Notes (Signed)
Pt reports to the ED for eval of generalized weakness x 1 week. He also reports some chest heaviness and SOB with minimal exertion. Denies any N/V, known fevers, syncope, or LOC. Pt was 70% on RA when EMS arrival. They placed him on a NRB and his sats improved to 85-92%. Pt has hx of kidney and liver transplant. He also has hx of MI and atrial fibrillation. He is on Coumadin. Pt A&Ox4 and skin warm and dry.

## 2016-12-17 NOTE — H&P (Signed)
History and Physical    [name] [name]:[id number] DOB: [birth year] DOA: 12/17/2016  Referring MD/NP/PA:   PCP: [name]., MD   Patient coming from:  The patient is coming from home.  At baseline, pt is independent for most of ADL.   Chief Complaint: Chest pain, shortness of breath, fever  HPI: [name] is a 66 y.o. male with medical history significant of hypertension, hyperlipidemia, gout, depression, idiopathic thrombocytopenia, CAD, myocardial infarction 3, CABG 1991, atrial fibrillation on Coumadin, liver transplantation, kidney transplantation, sCHF with EF of 45-50 percent, CKD (baselin Cre 1.3 per pt), who presents with chest pain and SOB.  Patient states that he has generalized weakness for about 1 week. He developed chest pain since 1:30 PM today. The chest pain is located in the frontal chest, constant, 3 out of 10 in severity, pressure-like, nonradiating, pleuritic, aggravated by deep breath. He also has shortness of breath. He has some mild intermittent fever and chills. No runny nose, sore throat. Has mild dry cough. Patient denies nausea, vomiting, abdominal pain, diarrhea, symptoms of UTI or unilateral weakness.  ED Course: pt was found to have WBC 7.8, lactate1.24, INR 2.03, negative troponin, negative urinalysis, negative flu PCR, slightly worsening renal function, temperature 100.8, tachycardia, tachypnea, oxygen saturation down to 70%, improved to 99 % with nonrebreather. Patient received 3 liters of normal saline bolus in the emergency room with worsening SOB. Chest x-ray showed vascular congestion without infiltration. Patient is admitted to stepdown as inpatient. Cardiology, Dr. Raiford Simmonds was consulted.  Review of Systems:   General: has fevers, chills, no changes in body weight, has fatigue HEENT: no blurry vision, hearing changes or sore throat Respiratory: has dyspnea, coughing, no wheezing CV: has chest pain, no palpitations GI: no nausea, no  vomiting, abdominal pain, diarrhea, constipation GU: no dysuria, burning on urination, increased urinary frequency, hematuria  Ext: no leg edema Neuro: no unilateral weakness, numbness, or tingling, no vision change or hearing loss Skin: no rash, no skin tear. MSK: No muscle spasm, no deformity, no limitation of range of movement in spin Heme: No easy bruising.  Travel history: No recent long distant travel.  Allergy:  Allergies  Allergen Reactions  . Grapefruit Concentrate Other (See Comments)    Pt has been told not to eat grapefruit  . Haloperidol Other (See Comments)    Too sedated  . Nsaids Other (See Comments)    Cannot take due to liver transplant  . Rifampin Other (See Comments)    Caused flushing  . Triazolam Other (See Comments)    Too sedated    Past Medical History:  Diagnosis Date  . Atrial fibrillation ([place]) 08/2009  . CAD (coronary artery disease)    s/p CABG -- 1992  . ESRD (end stage renal disease) ([place])   . HLD (hyperlipidemia)   . HTN (hypertension)   . Idiopathic thrombocytopenia (HCC)     Past Surgical History:  Procedure Laterality Date  . APPENDECTOMY    . CORONARY ARTERY BYPASS GRAFT    . FACIAL COSMETIC SURGERY    . GALLBLADDER SURGERY    . HERNIA REPAIR    . KIDNEY TRANSPLANT    . LIVER TRANSPLANT      Social History:  reports that he has never smoked. He has never used smokeless tobacco. He reports that he does not drink alcohol or use drugs.  Family History:  Family History  Problem Relation Age of Onset  . Atrial fibrillation Mother   . Breast cancer  Mother   . Renal cancer Father   . Hypertension Brother   . Hyperlipidemia Brother   . Other Brother     stent   . Renal Disease Maternal Grandmother   . Stroke Maternal Grandmother   . Heart attack Maternal Grandfather   . Cancer Paternal Grandmother   . Heart failure Paternal Grandfather   . Emphysema Paternal Grandfather   . Bronchiolitis Paternal Grandfather      Prior to  Admission medications   Medication Sig Start Date End Date Taking? Authorizing Provider  acetaminophen (TYLENOL) 500 MG tablet Take 1,000 mg by mouth every 6 (six) hours as needed for headache (pain).   Yes Historical Provider, MD  allopurinol (ZYLOPRIM) 100 MG tablet Take 100 mg by mouth daily.   Yes Historical Provider, MD  carvedilol (COREG) 12.5 MG tablet TAKE 1 TABLET BY MOUTH TWICE DAILY. 11/18/16  Yes Deboraha Sprang, MD  Cyanocobalamin (VITAMIN B12 PO) Take 1 tablet by mouth daily.   Yes Historical Provider, MD  DULoxetine (CYMBALTA) 60 MG capsule Take 60 mg by mouth at bedtime.   Yes Historical Provider, MD  EPINEPHrine (EPIPEN) 0.3 mg/0.3 mL DEVI Inject 0.3 mg into the muscle once as needed (severe allergic reaction).    Yes Historical Provider, MD  ferrous sulfate 325 (65 FE) MG tablet Take 325 mg by mouth daily with breakfast.   Yes Historical Provider, MD  fish oil-omega-3 fatty acids 1000 MG capsule Take 2 g by mouth 2 (two) times daily.    Yes Historical Provider, MD  furosemide (LASIX) 40 MG tablet Take 40 mg by mouth daily.    Yes Historical Provider, MD  Melatonin 1 MG TABS Take 1 mg by mouth at bedtime.   Yes Historical Provider, MD  nitroGLYCERIN (NITROSTAT) 0.4 MG SL tablet Place 0.4 mg under the tongue every 5 (five) minutes as needed for chest pain.   Yes Historical Provider, MD  predniSONE (DELTASONE) 5 MG tablet Take 5 mg by mouth daily.     Yes Historical Provider, MD  QUEtiapine (SEROQUEL) 100 MG tablet Take 100 mg by mouth at bedtime. 07/18/15  Yes Historical Provider, MD  tacrolimus (PROGRAF) 0.5 MG capsule Take 1 mg by mouth 2 (two) times daily.    Yes Historical Provider, MD  traZODone (DESYREL) 150 MG tablet Take 150 mg by mouth at bedtime.   Yes Historical Provider, MD  warfarin (COUMADIN) 4 MG tablet TAKE AS DIRECTED BY COUMADIN CLINIC. Patient taking differently: TAKE 1 1/2 TABLETS ON MONDAY AT 11PM, TAKE 1 TABLET ON ALL OTHER DAYS OF THE WEEK AT 11PM OR  AS DIRECTED  BY COUMADIN CLINIC. 07/21/16  Yes Deboraha Sprang, MD  atorvastatin (LIPITOR) 10 MG tablet Take 1 tablet (10 mg total) by mouth daily. Patient not taking: Reported on 12/17/2016 01/02/16   Deboraha Sprang, MD    Physical Exam: Vitals:   12/17/16 2130 12/17/16 2148 12/17/16 2215 12/17/16 2300  BP: 109/78  98/74 96/68  Pulse: 111  103 (!) 108  Resp: (!) 33  20 (!) 22  Temp:    (!) 101.1 F (38.4 C)  TempSrc:    Oral  SpO2: 97% 97% 97% 97%  Weight:    92.5 kg (204 lb)  Height:    5\' 10"  (1.778 m)   General: Not in acute distress HEENT:       Eyes: PERRL, EOMI, no scleral icterus.       ENT: No discharge from the ears and nose, no  pharynx injection, no tonsillar enlargement.        Neck: No JVD, no bruit, no mass felt. Heme: No neck lymph node enlargement. Cardiac: S1/S2, RRR, No murmurs, No gallops or rubs. Respiratory: No rales, wheezing, rhonchi or rubs. GI: Soft, nondistended, nontender, no rebound pain, no organomegaly, BS present. GU: No hematuria Ext: No pitting leg edema bilaterally. 2+DP/PT pulse bilaterally. Musculoskeletal: No joint deformities, No joint redness or warmth, no limitation of ROM in spin. Skin: No rashes.  Neuro: Alert, oriented X3, cranial nerves II-XII grossly intact, moves all extremities normally.  Psych: Patient is not psychotic, no suicidal or hemocidal ideation.  Labs on Admission: I have personally reviewed following labs and imaging studies  CBC:  Recent Labs Lab 12/17/16 1740  WBC 7.8  NEUTROABS 6.9  HGB 12.3*  HCT 38.5*  MCV 81.6  PLT 77*   Basic Metabolic Panel:  Recent Labs Lab 12/17/16 1740  NA 138  K 4.2  CL 102  CO2 24  GLUCOSE 121*  BUN 16  CREATININE 1.47*  CALCIUM 9.8   GFR: Estimated Creatinine Clearance: 58.8 mL/min (by C-G formula based on SCr of 1.47 mg/dL (H)). Liver Function Tests:  Recent Labs Lab 12/17/16 1740  AST 22  ALT 16*  ALKPHOS 55  BILITOT 1.8*  PROT 7.0  ALBUMIN 3.8   No results for  input(s): LIPASE, AMYLASE in the last 168 hours. No results for input(s): AMMONIA in the last 168 hours. Coagulation Profile:  Recent Labs Lab 12/17/16 1740  INR 2.03   Cardiac Enzymes: No results for input(s): CKTOTAL, CKMB, CKMBINDEX, TROPONINI in the last 168 hours. BNP (last 3 results) No results for input(s): PROBNP in the last 8760 hours. HbA1C: No results for input(s): HGBA1C in the last 72 hours. CBG: No results for input(s): GLUCAP in the last 168 hours. Lipid Profile: No results for input(s): CHOL, HDL, LDLCALC, TRIG, CHOLHDL, LDLDIRECT in the last 72 hours. Thyroid Function Tests: No results for input(s): TSH, T4TOTAL, FREET4, T3FREE, THYROIDAB in the last 72 hours. Anemia Panel: No results for input(s): VITAMINB12, FOLATE, FERRITIN, TIBC, IRON, RETICCTPCT in the last 72 hours. Urine analysis:    Component Value Date/Time   COLORURINE STRAW (A) 12/17/2016 1810   APPEARANCEUR CLEAR 12/17/2016 1810   LABSPEC 1.006 12/17/2016 1810   PHURINE 5.0 12/17/2016 1810   GLUCOSEU NEGATIVE 12/17/2016 1810   HGBUR SMALL (A) 12/17/2016 1810   BILIRUBINUR NEGATIVE 12/17/2016 1810   KETONESUR NEGATIVE 12/17/2016 1810   PROTEINUR NEGATIVE 12/17/2016 1810   NITRITE NEGATIVE 12/17/2016 1810   LEUKOCYTESUR NEGATIVE 12/17/2016 1810   Sepsis Labs: @LABRCNTIP (procalcitonin:4,lacticidven:4) ) Recent Results (from the past 240 hour(s))  MRSA PCR Screening     Status: None   Collection Time: 12/17/16 11:17 PM  Result Value Ref Range Status   MRSA by PCR NEGATIVE NEGATIVE Final    Comment:        The GeneXpert MRSA Assay (FDA approved for NASAL specimens only), is one component of a comprehensive MRSA colonization surveillance program. It is not intended to diagnose MRSA infection nor to guide or monitor treatment for MRSA infections.      Radiological Exams on Admission: Dg Chest Port 1 View  Result Date: 12/17/2016 CLINICAL DATA:  Chest pain and heaviness. EXAM: PORTABLE  CHEST 1 VIEW COMPARISON:  11/22/2016 FINDINGS: Bilateral diffuse interstitial thickening. No pleural effusion or pneumothorax. No focal consolidation. Stable cardiomegaly. Prior CABG. No acute osseous abnormality. IMPRESSION: Cardiomegaly with mild pulmonary vascular congestion. Electronically Signed  By: Kathreen Devoid   On: 12/17/2016 18:04     EKG: Independently reviewed.  QTC 465, atrial fibrillation   Assessment/Plan Principal Problem:   Chest pain Active Problems:   Idiopathic thrombocytopenia (HCC)   HTN (hypertension)   HLD (hyperlipidemia)   CAD (coronary artery disease)   Atrial fibrillation (HCC)   Gout   History of ITP   Chronic systolic CHF (congestive heart failure) (HCC)   Sepsis (HCC)   History of liver transplant ([place])   History of kidney transplant   Acute respiratory failure with hypoxia (HCC)   CKD (chronic kidney disease), stage III   Chest pain and hx of CAD: INR is therapeutic, less likely to have for PE. Chest x-ray did not show obvious infiltration. Patient has significant cardiac risk factors, including hypertension, hyperlipidemia, CAD, myocardial infarction 3, CABG, will need to r/o ACD. Card, dr. Raiford Simmonds was consulted-->thoght likely due to sepsis, trend trop and hold IV heparin since pt is fully anticoagulated with warfarin.   - will admit to SDU as inpt - Appreciated cardiologist recommendations - cycle CE q6 x3 and repeat EKG in the am  - Nitroglycerin, Morphine, lipitor, coreg - allergic to NSAIDs, will not start ASA since pt is on coumadin - Risk factor stratification: will check FLP and A1C  - 2d echo  Acute respiratory failure with hypoxia: Patient has SOB, oxygen desaturated to 70% on room air. Patient has therapeutic INR, less likely to have PE. Patient received 3 L of normal saline bolus in the emergency room, with worsening shortness of breath, indicating possible fluid overload. BNP is pending. Chest x-ray showed a vascular congestion. Flu  pcr negative. -Will give 1 dose of Lasix 40 mg 1 -prn Albuterol nebs  Sepsis: Patient meets criteria for sepsis with fever, tachycardia and tachypnea, WBC normal, lactate is normal. Etiology is not clear. Urinalysis negative. Chest x-ray has no infiltration. Since patient is immunosuppressed, will need antibiotics with broad coverage. Patient received one dose of Rocephin and azithromycin in ED.  - will switch Rocephin to vancomycin and cefepime. -Continue azithromycin -will get Procalcitonin and trend lactic acid levels per sepsis protocol. -IVF: 3L of NS bolus in ED. Will not continue IVF since pt may have fluid overload and his lactic acid level is normal.  Chronic systolic CHF: 2-D echo on 08/07/15 showed EF of 45-50 percent. Patient does not have leg edema, but his SOB has worsened after received 3 L normal saline bolus, indicating possible fluid overload. -Patient was given one dose of Lasix, 40 mg 1 -continue homed dose of lasix 40 mg daily -Continue Coreg -check BNP  Hx of  liver transplant and kidney transplant: -continue home prednisone 5 mg daily and tacrolimus -Give one dose of Solu Cortef, 50 mg 1 as stress dose -Check cortisol level  Idiopathic thrombocytopenia ([place]): Platelet 77. No bleeding tendency -Follow-up by CBC  HTN: -Continue Lasix and Coreg  HLD: Last LDL was not on record -Continue home medications: Lipitor -Check FLP  Gout: -continue home allopurinol   Atrial Fibrillation: CHA2DS2-VASc Score is 4, needs oral anticoagulation. Patient is on Coumadin at home. INR is 2.03 on admission. Heart rate is 100 ot 110s. -continue coumadin per pharm -continue coreg  CKD (chronic kidney disease), stage III: Baseline creatinine 1.3 per patient. His creatinine is 1.47, which is close to baseline. -Follow up renal function by BMP   DVT ppx: On Coumadin Code Status: DNR Family Communication: None at bed side Disposition Plan:  Anticipate discharge back  to  previous home environment Consults called:  Card, Dr. Raiford Simmonds Admission status: SDU/inpation       Date of Service 12/18/2016    Ivor Costa [facility] Pager [phone]  If 7PM-7AM, please contact night-coverage [email or web address] Password [email or web address] 12/18/2016, 2:36 AM

## 2016-12-17 NOTE — Consult Note (Signed)
Cardiology Consult    Patient ID: [name] MRN: [id number], DOB/AGE: [birth year]   Admit date: 12/17/2016 Date of Consult: 12/17/2016  Primary Physician: [name]., MD Primary Cardiologist: [name]  Requesting Provider: Blaine Hamper  Patient Profile    65 y o man with liver/ kidney tx and cad presents with fever and CP  Past Medical History   Past Medical History:  Diagnosis Date  . Atrial fibrillation ([place]) 08/2009  . CAD (coronary artery disease)    s/p CABG -- 1992  . ESRD (end stage renal disease) ([place])   . HLD (hyperlipidemia)   . HTN (hypertension)   . Idiopathic thrombocytopenia (HCC)     Past Surgical History:  Procedure Laterality Date  . APPENDECTOMY    . CORONARY ARTERY BYPASS GRAFT    . FACIAL COSMETIC SURGERY    . GALLBLADDER SURGERY    . HERNIA REPAIR    . KIDNEY TRANSPLANT    . LIVER TRANSPLANT       Allergies  Allergies  Allergen Reactions  . Grapefruit Concentrate Other (See Comments)    Pt has been told not to eat grapefruit  . Haloperidol Other (See Comments)    Too sedated  . Nsaids Other (See Comments)    Cannot take due to liver transplant  . Rifampin Other (See Comments)    Caused flushing  . Triazolam Other (See Comments)    Too sedated    History of Present Illness    [name] is a 66 y.o. male seen as a consult for chest pain. He is followed by Dr. [name] for permanent atrial fibrillation in the context of CAD with prior CABG and EF 41%;  he has a history of kidney and liver transplant 2008 and is followed at [facility].   Today he developed retrosternal burning CP, non radiating. Onset around 8 am. Pain came on at rest. Shortly after he spiked a fever. He denies new SOB but has chronic SO since the 90's. No LEE, PND or orthopnea. He reports stable weight (weight himself this am. He denies new meds or missed meds. No new stressors. He exercises on a treadmill every other day. CP he experienced with MI in the past is different  than what he experiences now.   In the ED he was found to be hypotensive to the 90-100 sys. IV fluids were administered.    Last catheterization report was 2008/. Native vessels all occluded, 3/4 grafts are patent. Echo 08/07/15    Myoview 11/19/14 Anterolateral and posterolateral scar, no ischemia, EF 41%   Inpatient Medications    . [START ON 12/18/2016] allopurinol  100 mg Oral Daily  . [START ON 12/18/2016] atorvastatin  10 mg Oral Daily  . carvedilol  12.5 mg Oral BID  . DULoxetine  60 mg Oral QHS  . [START ON 12/18/2016] ferrous sulfate  325 mg Oral Q breakfast  . furosemide  40 mg Intravenous Once  . [START ON 12/18/2016] furosemide  40 mg Oral Daily  . Melatonin  3 mg Oral QHS  . omega-3 acid ethyl esters  2 g Oral BID  . [START ON 12/18/2016] predniSONE  5 mg Oral Daily  . QUEtiapine  100 mg Oral QHS  . tacrolimus  1 mg Oral BID  . traZODone  150 mg Oral QHS  . [START ON 12/18/2016] vitamin B-12  100 mcg Oral Daily    Family History    Family History  Problem Relation Age of Onset  . Atrial  fibrillation Mother   . Breast cancer Mother   . Renal cancer Father   . Hypertension Brother   . Hyperlipidemia Brother   . Other Brother     stent   . Renal Disease Maternal Grandmother   . Stroke Maternal Grandmother   . Heart attack Maternal Grandfather   . Cancer Paternal Grandmother   . Heart failure Paternal Grandfather   . Emphysema Paternal Grandfather   . Bronchiolitis Paternal Grandfather     Social History    Social History   Social History  . Marital status: Single    Spouse name: N/A  . Number of children: 0  . Years of education: N/A   Occupational History  . retired    Social History Main Topics  . Smoking status: Never Smoker  . Smokeless tobacco: Never Used  . Alcohol use No  . Drug use: No  . Sexual activity: Not on file   Other Topics Concern  . Not on file   Social History Narrative  . No narrative on file     Review of Systems      General:  No chills, fever, night sweats or weight changes.  Cardiovascular:  No chest pain, dyspnea on exertion, edema, orthopnea, palpitations, paroxysmal nocturnal dyspnea. Dermatological: No rash, lesions/masses Respiratory: No cough, dyspnea Urologic: No hematuria, dysuria Abdominal:   No nausea, vomiting, diarrhea, bright red blood per rectum, melena, or hematemesis Neurologic:  No visual changes, wkns, changes in mental status. All other systems reviewed and are otherwise negative except as noted above.  Physical Exam    Blood pressure 109/78, pulse 111, temperature 100.8 F (38.2 C), temperature source Oral, resp. rate (!) 33, SpO2 97 %.  General: Pursed lip breathing Psych: Normal affect. Neuro: Alert and oriented X 3. Moves all extremities spontaneously. HEENT: Normal  Neck: Supple without bruits or JVD. Lungs:  Resp regular and unlabored, CTA. Heart: irregular Abdomen: Soft, non-tender, non-distended, BS + x 4.  Extremities: warm, no edema  Labs    Troponin (Point of Care Test)  Recent Labs  12/17/16 1748  TROPIPOC 0.02   No results for input(s): CKTOTAL, CKMB, TROPONINI in the last 72 hours. Lab Results  Component Value Date   WBC 7.8 12/17/2016   HGB 12.3 (L) 12/17/2016   HCT 38.5 (L) 12/17/2016   MCV 81.6 12/17/2016   PLT 77 (L) 12/17/2016    Recent Labs Lab 12/17/16 1740  NA 138  K 4.2  CL 102  CO2 24  BUN 16  CREATININE 1.47*  CALCIUM 9.8  PROT 7.0  BILITOT 1.8*  ALKPHOS 55  ALT 16*  AST 22  GLUCOSE 121*   No results found for: CHOL, HDL, LDLCALC, TRIG No results found for: [facility]   Radiology Studies    Dg Chest Port 1 View  Result Date: 12/17/2016 CLINICAL DATA:  Chest pain and heaviness. EXAM: PORTABLE CHEST 1 VIEW COMPARISON:  11/22/2016 FINDINGS: Bilateral diffuse interstitial thickening. No pleural effusion or pneumothorax. No focal consolidation. Stable cardiomegaly. Prior CABG. No acute osseous abnormality. IMPRESSION:  Cardiomegaly with mild pulmonary vascular congestion. Electronically Signed   By: Kathreen Devoid   On: 12/17/2016 18:04    ECG & Cardiac Imaging    Afib RVR. Inferior q waves  Assessment & Plan    [name] has a PMH of CAD and liver/renal Tx. Now he presents with fever and unstable angina. His unstable angina has to be seen in the context of the fever and hypotension. He  meets SIRS criteria and possibly septic. Given his extensive coronary disease I am not surprised he has some chest pain given myocardial stress (high output and hypotension). This however does not necessarily indicate the need for revascularization. His biomarkers are negative. He is euvolemic on exam.  Recommendations:  - Treat underlying infection - Hold heparin as he is fully anticoagulated with warfarin.  - Trend troponins.  - Will follow along  Signed, Cristina Gong, MD 12/17/2016, 10:16 PM

## 2016-12-17 NOTE — Progress Notes (Signed)
ANTICOAGULATION CONSULT NOTE - Initial Consult  Pharmacy Consult for Coumadin Indication: atrial fibrillation  Allergies  Allergen Reactions  . Grapefruit Concentrate Other (See Comments)    Pt has been told not to eat grapefruit  . Haloperidol Other (See Comments)    Too sedated  . Nsaids Other (See Comments)    Cannot take due to liver transplant  . Rifampin Other (See Comments)    Caused flushing  . Triazolam Other (See Comments)    Too sedated    Patient Measurements:    Vital Signs: Temp: 100.8 F (38.2 C) (02/09 1725) Temp Source: Oral (02/09 1725) BP: 98/74 (02/09 2215) Pulse Rate: 103 (02/09 2215)  Labs:  Recent Labs  12/17/16 1740  HGB 12.3*  HCT 38.5*  PLT 77*  LABPROT 23.2*  INR 2.03  CREATININE 1.47*    CrCl cannot be calculated (Unknown ideal weight.).   Medical History: Past Medical History:  Diagnosis Date  . Atrial fibrillation ([place]) 08/2009  . CAD (coronary artery disease)    s/p CABG -- 1992  . ESRD (end stage renal disease) ([place])   . HLD (hyperlipidemia)   . HTN (hypertension)   . Idiopathic thrombocytopenia ([facility])     Medications:  Prescriptions Prior to Admission  Medication Sig Dispense Refill Last Dose  . acetaminophen (TYLENOL) 500 MG tablet Take 1,000 mg by mouth every 6 (six) hours as needed for headache (pain).   12/17/2016 at 1330  . allopurinol (ZYLOPRIM) 100 MG tablet Take 100 mg by mouth daily.   12/17/2016 at Unknown time  . carvedilol (COREG) 12.5 MG tablet TAKE 1 TABLET BY MOUTH TWICE DAILY. 60 tablet 11 12/17/2016 at 1100  . Cyanocobalamin (VITAMIN B12 PO) Take 1 tablet by mouth daily.   12/17/2016 at Unknown time  . DULoxetine (CYMBALTA) 60 MG capsule Take 60 mg by mouth at bedtime.   12/16/2016 at Unknown time  . EPINEPHrine (EPIPEN) 0.3 mg/0.3 mL DEVI Inject 0.3 mg into the muscle once as needed (severe allergic reaction).    several years ago  . ferrous sulfate 325 (65 FE) MG tablet Take 325 mg by mouth daily with  breakfast.   12/17/2016 at Unknown time  . fish oil-omega-3 fatty acids 1000 MG capsule Take 2 g by mouth 2 (two) times daily.    12/17/2016 at am  . furosemide (LASIX) 40 MG tablet Take 40 mg by mouth daily.    12/17/2016 at Unknown time  . Melatonin 1 MG TABS Take 1 mg by mouth at bedtime.   12/16/2016 at Unknown time  . nitroGLYCERIN (NITROSTAT) 0.4 MG SL tablet Place 0.4 mg under the tongue every 5 (five) minutes as needed for chest pain.   12/17/2016 at 1330  . predniSONE (DELTASONE) 5 MG tablet Take 5 mg by mouth daily.     12/17/2016 at Unknown time  . QUEtiapine (SEROQUEL) 100 MG tablet Take 100 mg by mouth at bedtime.   12/16/2016 at Unknown time  . tacrolimus (PROGRAF) 0.5 MG capsule Take 1 mg by mouth 2 (two) times daily.    12/17/2016 at 1100  . traZODone (DESYREL) 150 MG tablet Take 150 mg by mouth at bedtime.   12/16/2016 at Unknown time  . warfarin (COUMADIN) 4 MG tablet TAKE AS DIRECTED BY COUMADIN CLINIC. (Patient taking differently: TAKE 1 1/2 TABLETS ON MONDAY AT 11PM, TAKE 1 TABLET ON ALL OTHER DAYS OF THE WEEK AT 11PM OR  AS DIRECTED BY COUMADIN CLINIC.) 40 tablet 3 12/16/2016 at 2300  .  atorvastatin (LIPITOR) 10 MG tablet Take 1 tablet (10 mg total) by mouth daily. (Patient not taking: Reported on 12/17/2016) 90 tablet 3 Not Taking at Unknown time    Assessment: 66 y.o. M presents with fever and [place]. Trop negative thus far. Pt on coumadin PTA for afib. Admission INR 2.03 (therapeutic). H/H ok on admission, noted plt 77 (pt with h/o idiopathic thrombocytopenia). Home dose: 4mg  daily except for 6mg  on Monday  Initially heparin was ordered for patient but cardiology d/c heparin and wants to restart coumadin.  Goal of Therapy:  INR 2-3 Monitor platelets by anticoagulation protocol: Yes   Plan:  Daily INR Coumadin 4mg  daily except for 6mg  on Monday  Sherlon Handing, PharmD, BCPS Clinical pharmacist, pager [phone] 12/17/2016,11:32 PM

## 2016-12-17 NOTE — Progress Notes (Signed)
ANTICOAGULATION CONSULT NOTE - Initial Consult  Pharmacy Consult for heparin Indication: atrial fibrillation/ACS rule out  Allergies  Allergen Reactions  . Grapefruit Concentrate Other (See Comments)    Pt has been told not to eat grapefruit  . Haloperidol Other (See Comments)    Too sedated  . Nsaids Other (See Comments)    Cannot take due to liver transplant  . Rifampin Other (See Comments)    Caused flushing  . Triazolam Other (See Comments)    Too sedated    Patient Measurements:   Heparin Dosing Weight: 91 kg  Vital Signs: Temp: 100.8 F (38.2 C) (02/09 1725) Temp Source: Oral (02/09 1725) BP: 109/78 (02/09 2130) Pulse Rate: 111 (02/09 2130)  Labs:  Recent Labs  12/17/16 1740  HGB 12.3*  HCT 38.5*  PLT 77*  LABPROT 23.2*  INR 2.03  CREATININE 1.47*    CrCl cannot be calculated (Unknown ideal weight.).   Medical History: Past Medical History:  Diagnosis Date  . Atrial fibrillation ([place]) 08/2009  . CAD (coronary artery disease)    s/p CABG -- 1992  . ESRD (end stage renal disease) ([place])   . HLD (hyperlipidemia)   . HTN (hypertension)   . Idiopathic thrombocytopenia ([facility])     Assessment: 64 yoM with PMH CAD, atrial fibrillation on warfarin PTA, and ITP presents with SOB and chest pain. Pharmacy consulted to start heparin drip. Last dose of warfarin was 2/8 and INR on admit is 2.03. Platelets on admit are 77 (unknown baseline), Hgb 12.3, troponins negative.   Given cardiac history and borderline subtherapeutic INR, will initiate heparin drip now, but due to therapeutic INR and concomitant ITP will avoid bolus and target a slightly lower heparin level goal for now.  Goal of Therapy:  Heparin Level 0.3 - 0.5 Monitor platelets by anticoagulation protocol: Yes   Plan:  -Heparin 900 units/hr (~10 units/kg/hr) -Obtain 6-hr heparin level -Check INR in morning to ensure it does not acutely increase -Monitor platelets, S/Sx bleeding carefully -Daily  heparin level  Arrie Senate, PharmD PGY-1 Pharmacy Resident Pager: [phone] 12/17/2016

## 2016-12-17 NOTE — ED Provider Notes (Signed)
[facility] DEPT Provider Note   CSN: [id number] Arrival date & time: 12/17/16  1718     History   Chief Complaint Chief Complaint  Patient presents with  . Shortness of Breath    HPI [name] is a 66 y.o. male.  HPI 25yom with pmh of CAD s/p MIx3, CABG, renal and hepatic transplant several years ago on prednisone/immunocompromise who presents with episodes of chest pressure. States he is very active but on Wednesday did have chest pain with exertion while running on treadmill. Today, was taking a shower and felt CP with SOB, heavy pressure that radiated to left shoulder - not like prior MI but feels cardiac, states, "I need another cath." He felt sob. It occurs in episodes and he feels flushed. He states he has a hx of fevers intermittently since having CMV transplant infection many years ago. Pt was supposedly hypoxic to 70s by EMS and placed on NRB but upon arrival waveform excellent and sating 100% on RA.   Past Medical History:  Diagnosis Date  . Atrial fibrillation ([place]) 08/2009  . CAD (coronary artery disease)    s/p CABG -- 1992  . ESRD (end stage renal disease) ([place])   . HLD (hyperlipidemia)   . HTN (hypertension)   . Idiopathic thrombocytopenia [facility])     Patient Active Problem List   Diagnosis Date Noted  . Chest pain 12/17/2016  . Gout 12/17/2016  . 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 Chronic systolic CHF (congestive heart failure) ([place]) 12/17/2016  . Sepsis ([place]) 12/17/2016  . History of liver transplant ([place]) 12/17/2016  . History of kidney transplant 12/17/2016  . Acute respiratory failure with hypoxia ([place]) 12/17/2016  . CKD (chronic kidney disease), stage III 12/17/2016  . Idiopathic thrombocytopenia ([place])   . HTN (hypertension)   . HLD (hyperlipidemia)   . CAD (coronary artery disease)   . Long term (current) use of anticoagulants [Z79.01] 11/07/2015  . Encounter for therapeutic drug monitoring 11/07/2015  . Atrial fibrillation ([place]) 08/08/2009      Past Surgical History:  Procedure Laterality Date  . APPENDECTOMY    . CORONARY ARTERY BYPASS GRAFT    . FACIAL COSMETIC SURGERY    . GALLBLADDER SURGERY    . HERNIA REPAIR    . KIDNEY TRANSPLANT    . LIVER TRANSPLANT         Home Medications    Prior to Admission medications   Medication Sig Start Date End Date Taking? Authorizing Provider  acetaminophen (TYLENOL) 500 MG tablet Take 1,000 mg by mouth every 6 (six) hours as needed for headache (pain).   Yes Historical Provider, MD  allopurinol (ZYLOPRIM) 100 MG tablet Take 100 mg by mouth daily.   Yes Historical Provider, MD  carvedilol (COREG) 12.5 MG tablet TAKE 1 TABLET BY MOUTH TWICE DAILY. 11/18/16  Yes Deboraha Sprang, MD  Cyanocobalamin (VITAMIN B12 PO) Take 1 tablet by mouth daily.   Yes Historical Provider, MD  DULoxetine (CYMBALTA) 60 MG capsule Take 60 mg by mouth at bedtime.   Yes Historical Provider, MD  EPINEPHrine (EPIPEN) 0.3 mg/0.3 mL DEVI Inject 0.3 mg into the muscle once as needed (severe allergic reaction).    Yes Historical Provider, MD  ferrous sulfate 325 (65 FE) MG tablet Take 325 mg by mouth daily with breakfast.   Yes Historical Provider, MD  fish oil-omega-3 fatty acids 1000 MG capsule Take 2 g by mouth 2 (two) times daily.    Yes Historical Provider, MD  furosemide (LASIX) 40 MG tablet Take 40 mg by mouth daily.    Yes Historical Provider, MD  Melatonin 1 MG TABS Take 1 mg by mouth at bedtime.   Yes Historical Provider, MD  nitroGLYCERIN (NITROSTAT) 0.4 MG SL tablet Place 0.4 mg under the tongue every 5 (five) minutes as needed for chest pain.   Yes Historical Provider, MD  predniSONE (DELTASONE) 5 MG tablet Take 5 mg by mouth daily.     Yes Historical Provider, MD  QUEtiapine (SEROQUEL) 100 MG tablet Take 100 mg by mouth at bedtime. 07/18/15  Yes Historical Provider, MD  tacrolimus (PROGRAF) 0.5 MG capsule Take 1 mg by mouth 2 (two) times daily.    Yes Historical Provider, MD  traZODone (DESYREL) 150 MG  tablet Take 150 mg by mouth at bedtime.   Yes Historical Provider, MD  warfarin (COUMADIN) 4 MG tablet TAKE AS DIRECTED BY COUMADIN CLINIC. Patient taking differently: TAKE 1 1/2 TABLETS ON MONDAY AT 11PM, TAKE 1 TABLET ON ALL OTHER DAYS OF THE WEEK AT 11PM OR  AS DIRECTED BY COUMADIN CLINIC. 07/21/16  Yes Deboraha Sprang, MD  atorvastatin (LIPITOR) 10 MG tablet Take 1 tablet (10 mg total) by mouth daily. Patient not taking: Reported on 12/17/2016 01/02/16   Deboraha Sprang, MD    Family History Family History  Problem Relation Age of Onset  . Atrial fibrillation Mother   . Breast cancer Mother   . Renal cancer Father   . Hypertension Brother   . Hyperlipidemia Brother   . Other Brother     stent   . Renal Disease Maternal Grandmother   . Stroke Maternal Grandmother   . Heart attack Maternal Grandfather   . Cancer Paternal Grandmother   . Heart failure Paternal Grandfather   . Emphysema Paternal Grandfather   . Bronchiolitis Paternal Grandfather     Social History Social History  Substance Use Topics  . Smoking status: Never Smoker  . Smokeless tobacco: Never Used  . Alcohol use No     Allergies   Grapefruit concentrate; Haloperidol; Nsaids; Rifampin; and Triazolam   Review of Systems Review of Systems  Constitutional: Positive for fever.  Respiratory: Negative for cough.   Gastrointestinal: Negative for diarrhea and vomiting.  Neurological: Positive for weakness.  All other systems reviewed and are negative.    Physical Exam Updated Vital Signs BP 120/71   Pulse 105   Temp 100.8 F (38.2 C) (Oral)   Resp 24   SpO2 93%   Physical Exam  Constitutional: He appears well-developed and well-nourished. He appears distressed (appears uncomfortable).  HENT:  Head: Normocephalic and atraumatic.  Left Ear: External ear normal.  Eyes: Conjunctivae are normal. Pupils are equal, round, and reactive to light. Right eye exhibits no discharge. Left eye exhibits no discharge.    Neck: Normal range of motion. Neck supple.  Cardiovascular: Normal heart sounds.   No murmur heard. Tachycardic, irregular  Pulmonary/Chest: Effort normal and breath sounds normal. No respiratory distress.  Abdominal: Soft. Bowel sounds are normal. He exhibits no distension and no mass. There is no tenderness. There is no rebound and no guarding.  Musculoskeletal: He exhibits no edema.  Neurological: He is alert.  Skin: Skin is warm. Capillary refill takes less than 2 seconds. He is not diaphoretic.  Psychiatric: He has a normal mood and affect.     ED Treatments / Results  Labs (all labs ordered are listed, but only abnormal results are displayed) Labs Reviewed  COMPREHENSIVE  METABOLIC PANEL - Abnormal; Notable for the following:       Result Value   Glucose, Bld 121 (*)    Creatinine, Ser 1.47 (*)    ALT 16 (*)    Total Bilirubin 1.8 (*)    GFR calc non Af Amer 49 (*)    GFR calc Af Amer 57 (*)    All other components within normal limits  CBC WITH DIFFERENTIAL/PLATELET - Abnormal; Notable for the following:    Hemoglobin 12.3 (*)    HCT 38.5 (*)    RDW 16.0 (*)    Platelets 77 (*)    Lymphs Abs 0.4 (*)    All other components within normal limits  URINALYSIS, ROUTINE W REFLEX MICROSCOPIC - Abnormal; Notable for the following:    Color, Urine STRAW (*)    Hgb urine dipstick SMALL (*)    All other components within normal limits  PROTIME-INR - Abnormal; Notable for the following:    Prothrombin Time 23.2 (*)    All other components within normal limits  CULTURE, BLOOD (ROUTINE X 2)  CULTURE, BLOOD (ROUTINE X 2)  CULTURE, EXPECTORATED SPUTUM-ASSESSMENT  INFLUENZA PANEL BY PCR (TYPE A & B)  BRAIN NATRIURETIC PEPTIDE  PROCALCITONIN  PROTIME-INR  HEPARIN LEVEL (UNFRACTIONATED)  CBC  I-STAT CG4 LACTIC ACID, ED  I-STAT TROPOININ, ED  I-STAT CG4 LACTIC ACID, ED    EKG  EKG Interpretation  Date/Time:  Friday December 17 2016 17:39:28 EST Ventricular Rate:  129 PR  Interval:    QRS Duration: 108 QT Interval:  328 QTC Calculation: 481 R Axis:   37 Text Interpretation:  Atrial fibrillation Ventricular premature complex Probable inferior infarct, age indeterminate Lateral leads are also involved No significant change since last tracing Confirmed by YAO  MD, DAVID ([id number]) on 12/17/2016 5:51:37 PM       Radiology Dg Chest Port 1 View  Result Date: 12/17/2016 CLINICAL DATA:  Chest pain and heaviness. EXAM: PORTABLE CHEST 1 VIEW COMPARISON:  11/22/2016 FINDINGS: Bilateral diffuse interstitial thickening. No pleural effusion or pneumothorax. No focal consolidation. Stable cardiomegaly. Prior CABG. No acute osseous abnormality. IMPRESSION: Cardiomegaly with mild pulmonary vascular congestion. Electronically Signed   By: Kathreen Devoid   On: 12/17/2016 18:04    Procedures Procedures (including critical care time)  Medications Ordered in ED Medications  albuterol (PROVENTIL) (2.5 MG/3ML) 0.083% nebulizer solution 2.5 mg (not administered)  furosemide (LASIX) injection 40 mg (not administered)  atorvastatin (LIPITOR) tablet 10 mg (not administered)  acetaminophen (TYLENOL) tablet 650 mg (not administered)  vitamin B-12 (CYANOCOBALAMIN) tablet 100 mcg (not administered)  ferrous sulfate tablet 325 mg (not administered)  Melatonin TABS 3 mg (not administered)  nitroGLYCERIN (NITROSTAT) SL tablet 0.4 mg (not administered)  carvedilol (COREG) tablet 12.5 mg (not administered)  allopurinol (ZYLOPRIM) tablet 100 mg (not administered)  DULoxetine (CYMBALTA) DR capsule 60 mg (not administered)  furosemide (LASIX) tablet 40 mg (not administered)  tacrolimus (PROGRAF) capsule 1 mg (not administered)  traZODone (DESYREL) tablet 150 mg (not administered)  QUEtiapine (SEROQUEL) tablet 100 mg (not administered)  predniSONE (DELTASONE) tablet 5 mg (not administered)  cefTRIAXone (ROCEPHIN) 1 g in dextrose 5 % 50 mL IVPB (not administered)  azithromycin (ZITHROMAX) 500 mg  in dextrose 5 % 250 mL IVPB (not administered)  ondansetron (ZOFRAN) injection 4 mg (not administered)  zolpidem (AMBIEN) tablet 5 mg (not administered)  omega-3 acid ethyl esters (LOVAZA) capsule 2 g (not administered)  heparin ADULT infusion 100 units/mL (25000 units/234mL sodium chloride 0.45%) (not  administered)  sodium chloride 0.9 % bolus 1,000 mL (0 mLs Intravenous Stopped 12/17/16 2103)    And  sodium chloride 0.9 % bolus 1,000 mL (0 mLs Intravenous Stopped 12/17/16 1916)    And  sodium chloride 0.9 % bolus 1,000 mL (0 mLs Intravenous Stopped 12/17/16 2103)  cefTRIAXone (ROCEPHIN) 1 g in dextrose 5 % 50 mL IVPB (0 g Intravenous Stopped 12/17/16 1916)  azithromycin (ZITHROMAX) 500 mg in dextrose 5 % 250 mL IVPB (500 mg Intravenous New Bag/Given 12/17/16 1915)  acetaminophen (TYLENOL) tablet 500 mg (500 mg Oral Given 12/17/16 2103)  metoprolol (LOPRESSOR) injection 2.5 mg (2.5 mg Intravenous Given 12/17/16 2103)     Initial Impression / Assessment and Plan / ED Course  I have reviewed the triage vital signs and the nursing notes.  Pertinent labs & imaging results that were available during my care of the patient were reviewed by me and considered in my medical decision making (see chart for details).    Concern for unstable angina in pt with chest pain. However, intermittently goes away. Echo in 2016 with EF 45-50%. However, does not appear volume overloaded. Does have AF with mild RVR but question whether this is related to sepsis. Sepsis work up sent but negative for source as UA, CXR negative. No significant URI sx. Flu negative. CAP coverage started. HDS. Small dose metoprolol ordered for AF with RVR. HDS otherwise. Multiple EKGs wo evidence of new TWI/STE/STD sugestive of ACS. Trop negative. Will admit.  Final Clinical Impressions(s) / ED Diagnoses   Final diagnoses:  Idiopathic thrombocytopenia ([place])  Secondary hypertension  Hyperlipidemia, unspecified hyperlipidemia type  Chronic atrial  fibrillation [facility])    New Prescriptions Current Discharge Medication List       Karma Greaser, MD 12/17/16 2256    Drenda Freeze, MD 12/17/16 2316

## 2016-12-18 ENCOUNTER — Other Ambulatory Visit (HOSPITAL_COMMUNITY): Payer: Medicare Other

## 2016-12-18 ENCOUNTER — Encounter (HOSPITAL_COMMUNITY): Payer: Self-pay

## 2016-12-18 ENCOUNTER — Inpatient Hospital Stay (HOSPITAL_COMMUNITY): Payer: Medicare Other

## 2016-12-18 DIAGNOSIS — I209 Angina pectoris, unspecified: Secondary | ICD-10-CM

## 2016-12-18 DIAGNOSIS — I2510 Atherosclerotic heart disease of native coronary artery without angina pectoris: Secondary | ICD-10-CM

## 2016-12-18 DIAGNOSIS — R071 Chest pain on breathing: Secondary | ICD-10-CM

## 2016-12-18 LAB — COMPREHENSIVE METABOLIC PANEL
ALBUMIN: 3.1 g/dL — AB (ref 3.5–5.0)
ALK PHOS: 43 U/L (ref 38–126)
ALT: 14 U/L — ABNORMAL LOW (ref 17–63)
AST: 17 U/L (ref 15–41)
Anion gap: 10 (ref 5–15)
BUN: 16 mg/dL (ref 6–20)
CALCIUM: 8.6 mg/dL — AB (ref 8.9–10.3)
CO2: 23 mmol/L (ref 22–32)
Chloride: 105 mmol/L (ref 101–111)
Creatinine, Ser: 1.71 mg/dL — ABNORMAL HIGH (ref 0.61–1.24)
GFR calc non Af Amer: 41 mL/min — ABNORMAL LOW (ref >60)
GFR, EST AFRICAN AMERICAN: 47 mL/min — AB (ref >60)
GLUCOSE: 132 mg/dL — AB (ref 65–99)
Potassium: 4 mmol/L (ref 3.5–5.1)
SODIUM: 138 mmol/L (ref 135–145)
Total Bilirubin: 1.6 mg/dL — ABNORMAL HIGH (ref 0.3–1.2)
Total Protein: 6.1 g/dL — ABNORMAL LOW (ref 6.5–8.1)

## 2016-12-18 LAB — PROTIME-INR
INR: 2.3
PROTHROMBIN TIME: 25.7 s — AB (ref 11.4–15.2)

## 2016-12-18 LAB — LIPID PANEL
CHOL/HDL RATIO: 7 ratio
Cholesterol: 175 mg/dL (ref 0–200)
HDL: 25 mg/dL — AB (ref >40)
LDL CALC: 129 mg/dL — AB (ref 0–99)
TRIGLYCERIDES: 105 mg/dL (ref <150)
VLDL: 21 mg/dL (ref 0–40)

## 2016-12-18 LAB — TROPONIN I
TROPONIN I: 0.04 ng/mL — AB (ref <0.03)
Troponin I: 0.04 ng/mL (ref <0.03)

## 2016-12-18 LAB — CORTISOL-AM, BLOOD: Cortisol - AM: 6.9 ug/dL (ref 6.7–22.6)

## 2016-12-18 LAB — CBC
HCT: 33.8 % — ABNORMAL LOW (ref 39.0–52.0)
Hemoglobin: 10.8 g/dL — ABNORMAL LOW (ref 13.0–17.0)
MCH: 26.2 pg (ref 26.0–34.0)
MCHC: 32 g/dL (ref 30.0–36.0)
MCV: 82 fL (ref 78.0–100.0)
PLATELETS: 58 10*3/uL — AB (ref 150–400)
RBC: 4.12 MIL/uL — ABNORMAL LOW (ref 4.22–5.81)
RDW: 16.4 % — AB (ref 11.5–15.5)
WBC: 7.9 10*3/uL (ref 4.0–10.5)

## 2016-12-18 LAB — MRSA PCR SCREENING: MRSA by PCR: NEGATIVE

## 2016-12-18 LAB — BRAIN NATRIURETIC PEPTIDE: B Natriuretic Peptide: 254.4 pg/mL — ABNORMAL HIGH (ref 0.0–100.0)

## 2016-12-18 LAB — PROCALCITONIN: Procalcitonin: 1.28 ng/mL

## 2016-12-18 MED ORDER — FUROSEMIDE 10 MG/ML IJ SOLN
40.0000 mg | Freq: Once | INTRAMUSCULAR | Status: AC
  Administered 2016-12-18: 40 mg via INTRAVENOUS
  Filled 2016-12-18: qty 4

## 2016-12-18 MED ORDER — VANCOMYCIN HCL IN DEXTROSE 750-5 MG/150ML-% IV SOLN
750.0000 mg | Freq: Two times a day (BID) | INTRAVENOUS | Status: DC
  Administered 2016-12-18 – 2016-12-19 (×2): 750 mg via INTRAVENOUS
  Filled 2016-12-18 (×3): qty 150

## 2016-12-18 MED ORDER — HYDROCORTISONE NA SUCCINATE PF 100 MG IJ SOLR
100.0000 mg | Freq: Three times a day (TID) | INTRAMUSCULAR | Status: DC
  Administered 2016-12-18 – 2016-12-20 (×6): 100 mg via INTRAVENOUS
  Filled 2016-12-18 (×6): qty 2

## 2016-12-18 MED ORDER — MORPHINE SULFATE (PF) 2 MG/ML IV SOLN: 2.0000 mg | INTRAVENOUS | Status: DC | PRN

## 2016-12-18 MED ORDER — CARVEDILOL 3.125 MG PO TABS
3.1250 mg | ORAL_TABLET | Freq: Two times a day (BID) | ORAL | Status: DC
  Administered 2016-12-18 – 2016-12-19 (×2): 3.125 mg via ORAL
  Filled 2016-12-18 (×2): qty 1

## 2016-12-18 MED ORDER — HYDROCORTISONE NA SUCCINATE PF 100 MG IJ SOLR
50.0000 mg | Freq: Once | INTRAMUSCULAR | Status: AC
  Administered 2016-12-18: 50 mg via INTRAVENOUS
  Filled 2016-12-18: qty 2

## 2016-12-18 MED ORDER — VANCOMYCIN HCL IN DEXTROSE 750-5 MG/150ML-% IV SOLN
750.0000 mg | Freq: Once | INTRAVENOUS | Status: AC
  Administered 2016-12-18: 750 mg via INTRAVENOUS
  Filled 2016-12-18: qty 150

## 2016-12-18 MED ORDER — DEXTROSE 5 % IV SOLN
1.0000 g | Freq: Three times a day (TID) | INTRAVENOUS | Status: DC
  Administered 2016-12-18 – 2016-12-19 (×4): 1 g via INTRAVENOUS
  Filled 2016-12-18 (×6): qty 1

## 2016-12-18 NOTE — Progress Notes (Addendum)
PROGRESS NOTE                                                                                                                                                                                                             Patient Demographics:    [name], is a 66 y.o. male, DOB - [birth year], EYE:[id number]  Admit date - 12/17/2016   Admitting Physician Ivor Costa, MD  Outpatient Primary MD for the patient is [name]., MD  LOS - 1  Chief Complaint  Patient presents with  . Shortness of Breath       Brief Narrative  [name] is a 66 y.o. male with medical history significant of hypertension, hyperlipidemia, gout, depression, idiopathic thrombocytopenia, CAD, myocardial infarction 3, CABG 1991, atrial fibrillation on Coumadin, liver transplantation, kidney transplantation, sCHF with EF of 45-50 percent, CKD (baselin Cre 1.3 per pt), presented with mild SOB and fevers, some atypical chest pain.   Subjective:    [name] today has, No headache, No chest pain, No abdominal pain - No Nausea, No new weakness tingling or numbness, No Cough - SOB.     Assessment  & Plan :     1. Atypical chest discomfort with orthopnea and shortness of breath. Due to acute on chronic systolic CHF. Last EF 45% in 2016. BNP elevated, chest x-ray has bilateral infiltrates likely pulmonary edema, since blood pressure is low will drop Coreg dose, IV Lasix, oxygen and nebulizer treatments. Fluid and salt restriction. Check echocardiogram to evaluate EF and wall motion. If blood pressure improves we will try to add long-acting nitrate and if possible low-dose Ace.  2. History of liver and kidney transplant. He follows at [facility] for his transplant care. Currently renal function,  liver enzymes are all stable, he has no abdominal pain, he did have nonspecific low-grade fever at home but currently appears nontoxic and stable. Continue home suppressant medications  as before. His blood pressure is soft will place him on stress dose steroids for a few days.  3. Suspected sepsis on admission. No source of the same, chest x-ray nonspecific, UA unremarkable, he has no productive cough, no abdominal pain, no dysuria or diarrhea, no skin rashes or joint effusions. No joint pains. Discussed his case with ID physician Dr. Megan Salon. For now we will monitor him clinically. Cultures are pending. He appears nontoxic.  4. Dyslipidemia. Continue statin.  5. History of gout. Continue allopurinol.  6. ITP. We will monitor platelet count intermittently.  7.Chr. A. fib. [place] vasc 2 score of at least 3. Continue beta blocker and Coumadin, pharmacy monitoring INR.  8. Mild ARF on CKD 4 - baseline creat close to 1.4, likely from CHF diurese.  9. CAD. Chest pain-free. EKG nonacute. Troponin stable. Continue combination of beta blocker, statin was secondary prevention, not on aspirin as he is on Coumadin. Echo as above.    Diet : Diet Heart Room service appropriate? Yes; Fluid consistency: Thin    Family Communication  :    Code Status :  DNR  Disposition Plan  :  Home 2-3 days  Consults  :  ID over the phone Dr Megan Salon  Procedures  :    TTE -   DVT Prophylaxis  :  Coumadin  Lab Results  Component Value Date   PLT 58 (L) 12/18/2016    Inpatient Medications  Scheduled Meds: . allopurinol  100 mg Oral Daily  . atorvastatin  10 mg Oral Daily  . azithromycin (ZITHROMAX) 500 MG IVPB  500 mg Intravenous Q24H  . carvedilol  12.5 mg Oral BID  . ceFEPime (MAXIPIME) IV  1 g Intravenous Q8H  . DULoxetine  60 mg Oral QHS  . ferrous sulfate  325 mg Oral Q breakfast  . furosemide  40 mg Oral Daily  . Melatonin  3 mg Oral QHS  . omega-3 acid ethyl esters  2 g Oral BID  .  predniSONE  5 mg Oral Daily  . QUEtiapine  100 mg Oral QHS  . tacrolimus  1 mg Oral BID  . traZODone  150 mg Oral QHS  . vancomycin  750 mg Intravenous Q12H  . vitamin B-12  100 mcg Oral Daily  . warfarin  4 mg Oral Once per day on Sun Tue Wed Thu Fri Sat  . [START ON 12/20/2016] warfarin  6 mg Oral Q Mon-1800  . Warfarin - Pharmacist Dosing Inpatient   Does not apply q1800   Continuous Infusions: . sodium chloride Stopped (12/18/16 0030)   PRN Meds:.acetaminophen, albuterol, morphine injection, nitroGLYCERIN, ondansetron (ZOFRAN) IV, zolpidem  Antibiotics  :    Anti-infectives    Start     Dose/Rate Route Frequency Ordered Stop   12/18/16 1800  cefTRIAXone (ROCEPHIN) 1 g in dextrose 5 % 50 mL IVPB  Status:  Discontinued     1 g 100 mL/hr over 30 Minutes Intravenous Every 24 hours 12/17/16 2144 12/18/16 0235   12/18/16 1800  azithromycin (ZITHROMAX) 500 mg in dextrose 5 % 250 mL IVPB     500 mg 250 mL/hr over 60 Minutes Intravenous Every 24 hours 12/17/16 2144     12/18/16 1400  vancomycin (VANCOCIN) IVPB 750 mg/150 ml premix     750 mg 150 mL/hr over 60 Minutes Intravenous Every 12 hours 12/18/16 0241     12/18/16 0400  ceFEPIme (MAXIPIME) 1 g in dextrose 5 % 50 mL IVPB  1 g 100 mL/hr over 30 Minutes Intravenous Every 8 hours 12/18/16 0241     12/18/16 0245  vancomycin (VANCOCIN) IVPB 750 mg/150 ml premix     750 mg 150 mL/hr over 60 Minutes Intravenous  Once 12/18/16 0241 12/18/16 0745   12/17/16 1830  cefTRIAXone (ROCEPHIN) 1 g in dextrose 5 % 50 mL IVPB     1 g 100 mL/hr over 30 Minutes Intravenous  Once 12/17/16 1824 12/17/16 1916   12/17/16 1830  azithromycin (ZITHROMAX) 500 mg in dextrose 5 % 250 mL IVPB     500 mg 250 mL/hr over 60 Minutes Intravenous  Once 12/17/16 1824 12/17/16 2015         Objective:   Vitals:   12/18/16 0815 12/18/16 0910 12/18/16 1010 12/18/16 1154  BP: 98/70 104/68 104/68 94/66  Pulse: 88 96 96 98  Resp: 20   18  Temp: 99.6 F (37.6  C)   98.4 F (36.9 C)  TempSrc: Oral   Oral  SpO2: 94% 96%  98%  Weight:      Height:        Wt Readings from Last 3 Encounters:  12/18/16 92.4 kg (203 lb 11.2 oz)  10/28/16 93.6 kg (206 lb 6.4 oz)  10/22/15 95 kg (209 lb 6 oz)     Intake/Output Summary (Last 24 hours) at 12/18/16 1302 Last data filed at 12/18/16 0830  Gross per 24 hour  Intake             4000 ml  Output             1150 ml  Net             2850 ml     Physical Exam  Awake Alert, Oriented X 3, No new F.N deficits, Normal affect [place].AT,PERRAL Supple Neck,No JVD, No cervical lymphadenopathy appriciated.  Symmetrical Chest wall movement, Good air movement bilaterally, CTAB RRR,No Gallops,Rubs or new Murmurs, No Parasternal Heave +ve B.Sounds, Abd Soft, No tenderness, No organomegaly appriciated, No rebound - guarding or rigidity. No Cyanosis, Clubbing or edema, No new Rash or bruise     Data Review:    CBC  Recent Labs Lab 12/17/16 1740 12/18/16 0743  WBC 7.8 7.9  HGB 12.3* 10.8*  HCT 38.5* 33.8*  PLT 77* 58*  MCV 81.6 82.0  MCH 26.1 26.2  MCHC 31.9 32.0  RDW 16.0* 16.4*  LYMPHSABS 0.4*  --   MONOABS 0.4  --   EOSABS 0.1  --   BASOSABS 0.0  --     Chemistries   Recent Labs Lab 12/17/16 1740 12/18/16 0743  NA 138 138  K 4.2 4.0  CL 102 105  CO2 24 23  GLUCOSE 121* 132*  BUN 16 16  CREATININE 1.47* 1.71*  CALCIUM 9.8 8.6*  AST 22 17  ALT 16* 14*  ALKPHOS 55 43  BILITOT 1.8* 1.6*   ------------------------------------------------------------------------------------------------------------------  Recent Labs  12/18/16 0515  CHOL 175  HDL 25*  LDLCALC 129*  TRIG 105  CHOLHDL 7.0    No results found for: HGBA1C ------------------------------------------------------------------------------------------------------------------ No results for input(s): TSH, T4TOTAL, T3FREE, THYROIDAB in the last 72 hours.  Invalid input(s):  FREET3 ------------------------------------------------------------------------------------------------------------------ No results for input(s): VITAMINB12, FOLATE, FERRITIN, TIBC, IRON, RETICCTPCT in the last 72 hours.  Coagulation profile  Recent Labs Lab 12/17/16 1740 12/18/16 0515  INR 2.03 2.30    No results for input(s): DDIMER in the last 72 hours.  Cardiac Enzymes  Recent Labs Lab  12/18/16 0711 12/18/16 0934  TROPONINI 0.04* 0.04*   ------------------------------------------------------------------------------------------------------------------    Component Value Date/Time   BNP 254.4 (H) 12/18/2016 0740    Micro Results Recent Results (from the past 240 hour(s))  Blood Culture (routine x 2)     Status: None (Preliminary result)   Collection Time: 12/17/16  6:00 PM  Result Value Ref Range Status   Specimen Description BLOOD LEFT ANTECUBITAL  Final   Special Requests BOTTLES DRAWN AEROBIC AND ANAEROBIC 10CC  Final   Culture NO GROWTH < 24 HOURS  Final   Report Status PENDING  Incomplete  Blood Culture (routine x 2)     Status: None (Preliminary result)   Collection Time: 12/17/16  6:05 PM  Result Value Ref Range Status   Specimen Description BLOOD LEFT HAND  Final   Special Requests IN PEDIATRIC BOTTLE 2CC  Final   Culture NO GROWTH < 24 HOURS  Final   Report Status PENDING  Incomplete  MRSA PCR Screening     Status: None   Collection Time: 12/17/16 11:17 PM  Result Value Ref Range Status   MRSA by PCR NEGATIVE NEGATIVE Final    Comment:        The GeneXpert MRSA Assay (FDA approved for NASAL specimens only), is one component of a comprehensive MRSA colonization surveillance program. It is not intended to diagnose MRSA infection nor to guide or monitor treatment for MRSA infections.     Radiology Reports  Dg Chest 2 View  Result Date: 12/18/2016 CLINICAL DATA:  Heaviness in left chest. EXAM: CHEST  2 VIEW COMPARISON:  December 17, 2016  FINDINGS: Persistent cardiomegaly. Persistent elevation of the right hemidiaphragm. Increased interstitial markings, left greater than right. No focal infiltrate. Probable atelectasis in the left base. Multiple nodular densities project over the left upper lung, not seen on previous studies. No other acute abnormalities. IMPRESSION: 1. The findings are most consistent with cardiomegaly and mild edema. 2. Nodular densities projected over the left upper lobe may be part of the broader process or artifactual as they were not seen on more remote comparison studies. Recommend attention on short-term follow-up. Electronically Signed   By: Dorise Bullion III M.D   On: 12/18/2016 12:37   Dg Chest Port 1 View  Result Date: 12/17/2016 CLINICAL DATA:  Chest pain and heaviness. EXAM: PORTABLE CHEST 1 VIEW COMPARISON:  11/22/2016 FINDINGS: Bilateral diffuse interstitial thickening. No pleural effusion or pneumothorax. No focal consolidation. Stable cardiomegaly. Prior CABG. No acute osseous abnormality. IMPRESSION: Cardiomegaly with mild pulmonary vascular congestion. Electronically Signed   By: Kathreen Devoid   On: 12/17/2016 18:04    Time Spent in minutes  30   Dace Denn K M.D on 12/18/2016 at 1:02 PM  Between 7am to 7pm - Pager - [phone]  After 7pm go to [email or web address] - password [facility]  [facility] -  Office  [phone]

## 2016-12-18 NOTE — Progress Notes (Signed)
Pharmacy Antibiotic Note  [name] is a 66 y.o. male admitted on 12/17/2016 with sepsis.  Pharmacy has been consulted for vancomycin and cefepime dosing.  Plan: Vancomycin 750mg  IV every 12 hours.  Goal trough 15-20 mcg/mL.  Cefepime 1g IV every 8 hours.  Height: 5\' 10"  (177.8 cm) Weight: 204 lb (92.5 kg) IBW/kg (Calculated) : 73  Temp (24hrs), Avg:101 F (38.3 C), Min:100.8 F (38.2 C), Max:101.1 F (38.4 C)   Recent Labs Lab 12/17/16 1740 12/17/16 1750  WBC 7.8  --   CREATININE 1.47*  --   LATICACIDVEN  --  1.24    Estimated Creatinine Clearance: 58.8 mL/min (by C-G formula based on SCr of 1.47 mg/dL (H)).    Allergies  Allergen Reactions  . Grapefruit Concentrate Other (See Comments)    Pt has been told not to eat grapefruit  . Haloperidol Other (See Comments)    Too sedated  . Nsaids Other (See Comments)    Cannot take due to liver transplant  . Rifampin Other (See Comments)    Caused flushing  . Triazolam Other (See Comments)    Too sedated     Thank you for allowing pharmacy to be a part of this patient's care.  Wynona Neat, PharmD, BCPS  12/18/2016 2:38 AM

## 2016-12-18 NOTE — Progress Notes (Signed)
Patient requesting to eat some crackers and have drink. Walden Field with [facility] notified and Diet approved until midnight, then npo. She was also notified that patient had a temperature of 101.1.

## 2016-12-18 NOTE — Progress Notes (Signed)
Patient Name: [name]      SUBJECTIVE:  History of coronary artery disease with prior bypass surgery and a negative Myoview 2016 and a history of permanent  atrial fibrillation.  He has  Presented with shortness of breath and fever hypotension and associated chest discomfort. He was found to be in atrial fibrillation with a rapid rate.  He has a history of recurrent fever for which no cause has been identified. The reason he came this time was because of the chest pain. It is now eabated       Past Medical History:  Diagnosis Date  . Atrial fibrillation ([place]) 08/2009  . CAD (coronary artery disease)    s/p CABG -- 1992  . ESRD (end stage renal disease) ([place])   . HLD (hyperlipidemia)   . HTN (hypertension)   . Idiopathic thrombocytopenia (HCC)     Scheduled Meds:  Scheduled Meds: . allopurinol  100 mg Oral Daily  . atorvastatin  10 mg Oral Daily  . azithromycin (ZITHROMAX) 500 MG IVPB  500 mg Intravenous Q24H  . carvedilol  3.125 mg Oral BID  . ceFEPime (MAXIPIME) IV  1 g Intravenous Q8H  . DULoxetine  60 mg Oral QHS  . ferrous sulfate  325 mg Oral Q breakfast  . furosemide  40 mg Intravenous Once  . hydrocortisone sod succinate (SOLU-CORTEF) inj  100 mg Intravenous Q8H  . Melatonin  3 mg Oral QHS  . omega-3 acid ethyl esters  2 g Oral BID  . QUEtiapine  100 mg Oral QHS  . tacrolimus  1 mg Oral BID  . traZODone  150 mg Oral QHS  . vancomycin  750 mg Intravenous Q12H  . vitamin B-12  100 mcg Oral Daily  . warfarin  4 mg Oral Once per day on Sun Tue Wed Thu Fri Sat  . [START ON 12/20/2016] warfarin  6 mg Oral Q Mon-1800  . Warfarin - Pharmacist Dosing Inpatient   Does not apply q1800   Continuous Infusions: . sodium chloride Stopped (12/18/16 0030)   acetaminophen, albuterol, morphine injection, nitroGLYCERIN, ondansetron (ZOFRAN) IV, zolpidem    PHYSICAL EXAM Vitals:   12/18/16 0815 12/18/16 0910 12/18/16 1010 12/18/16 1154  BP: 98/70  104/68 104/68 94/66  Pulse: 88 96 96 98  Resp: 20   18  Temp: 99.6 F (37.6 C)   98.4 F (36.9 C)  TempSrc: Oral   Oral  SpO2: 94% 96%  98%  Weight:      Height:        Well developed and nourished in no acute distress HENT normal Neck supple with JVP-flat Clear Regular rate and rhythm, no murmurs or gallops Abd-soft with active BS No Clubbing cyanosis edema Skin-warm and dry A & Oriented  Grossly normal sensory and motor function   TELEMETRY: Reviewed personnally pt in *AFib with controlled VR:    Intake/Output Summary (Last 24 hours) at 12/18/16 1345 Last data filed at 12/18/16 0830  Gross per 24 hour  Intake             4000 ml  Output             1150 ml  Net             2850 ml    LABS: Basic Metabolic Panel:  Recent Labs Lab 12/17/16 1740 12/18/16 0743  NA 138 138  K 4.2 4.0  CL 102 105  CO2 24 23  GLUCOSE  121* 132*  BUN 16 16  CREATININE 1.47* 1.71*  CALCIUM 9.8 8.6*   Cardiac Enzymes:  Recent Labs  12/18/16 0711 12/18/16 0934  TROPONINI 0.04* 0.04*   CBC:  Recent Labs Lab 12/17/16 1740 12/18/16 0743  WBC 7.8 7.9  NEUTROABS 6.9  --   HGB 12.3* 10.8*  HCT 38.5* 33.8*  MCV 81.6 82.0  PLT 77* 58*   PROTIME:  Recent Labs  12/17/16 1740 12/18/16 0515  LABPROT 23.2* 25.7*  INR 2.03 2.30   Liver Function Tests:  Recent Labs  12/17/16 1740 12/18/16 0743  AST 22 17  ALT 16* 14*  ALKPHOS 55 43  BILITOT 1.8* 1.6*  PROT 7.0 6.1*  ALBUMIN 3.8 3.1*   No results for input(s): LIPASE, AMYLASE in the last 72 hours. BNP: BNP (last 3 results)  Recent Labs  12/18/16 0740  BNP 254.4*    ProBNP (last 3 results) No results for input(s): PROBNP in the last 8760 hours.  D-Dimer: No results for input(s): DDIMER in the last 72 hours. Hemoglobin A1C: No results for input(s): HGBA1C in the last 72 hours. Fasting Lipid Panel:  Recent Labs  12/18/16 0515  CHOL 175  HDL 25*  LDLCALC 129*  TRIG 105  CHOLHDL 7.0   Thyroid  Function Tests: No results for input(s): TSH, T4TOTAL, T3FREE, THYROIDAB in the last 72 hours.  Invalid input(s): FREET3     ASSESSMENT AND PLAN:  Principal Problem:   Chest pain Active Problems:   Idiopathic thrombocytopenia (HCC)   HTN (hypertension)   HLD (hyperlipidemia)   CAD (coronary artery disease)   Atrial fibrillation (HCC)   Gout   History of ITP   Chronic systolic CHF (congestive heart failure) (HCC)   Sepsis ([place])   History of liver transplant ([place])   History of kidney transplant   Acute respiratory failure with hypoxia (HCC)   CKD (chronic kidney disease), stage III  Chest pain probably noncardiac in the context of his other illness. With interesting is that he has recurrent fevers and shortness of breath for which no cause is identified. I think rather than investigating that here, it should be explored at [facility] where the doctors were familiar with the potential implications of immunosuppressant therapy  If Ez remain neg would anticipate discharge tomorrow following Rene Paci MD  12/18/2016

## 2016-12-18 NOTE — Progress Notes (Signed)
[place] for Warfarin Indication: atrial fibrillation  Allergies  Allergen Reactions  . Grapefruit Concentrate Other (See Comments)    Pt has been told not to eat grapefruit  . Haloperidol Other (See Comments)    Too sedated  . Nsaids Other (See Comments)    Cannot take due to liver transplant  . Rifampin Other (See Comments)    Caused flushing  . Triazolam Other (See Comments)    Too sedated    Patient Measurements: Height: 5\' 10"  (177.8 cm) Weight: 203 lb 11.2 oz (92.4 kg) IBW/kg (Calculated) : 73  Vital Signs: Temp: 99.6 F (37.6 C) (02/10 0815) Temp Source: Oral (02/10 0815) BP: 104/68 (02/10 1010) Pulse Rate: 96 (02/10 1010)  Labs:  Recent Labs  12/17/16 1740 12/18/16 0515 12/18/16 0711 12/18/16 0743 12/18/16 0934  HGB 12.3*  --   --  10.8*  --   HCT 38.5*  --   --  33.8*  --   PLT 77*  --   --  58*  --   LABPROT 23.2* 25.7*  --   --   --   INR 2.03 2.30  --   --   --   CREATININE 1.47*  --   --  1.71*  --   TROPONINI  --   --  0.04*  --  0.04*    Estimated Creatinine Clearance: 50.5 mL/min (by C-G formula based on SCr of 1.71 mg/dL (H)).   Medical History: Past Medical History:  Diagnosis Date  . Atrial fibrillation ([place]) 08/2009  . CAD (coronary artery disease)    s/p CABG -- 1992  . ESRD (end stage renal disease) ([place])   . HLD (hyperlipidemia)   . HTN (hypertension)   . Idiopathic thrombocytopenia (HCC)     Medications:  Prescriptions Prior to Admission  Medication Sig Dispense Refill Last Dose  . acetaminophen (TYLENOL) 500 MG tablet Take 1,000 mg by mouth every 6 (six) hours as needed for headache (pain).   12/17/2016 at 1330  . allopurinol (ZYLOPRIM) 100 MG tablet Take 100 mg by mouth daily.   12/17/2016 at Unknown time  . carvedilol (COREG) 12.5 MG tablet TAKE 1 TABLET BY MOUTH TWICE DAILY. 60 tablet 11 12/17/2016 at 1100  . Cyanocobalamin (VITAMIN B12 PO) Take 1 tablet by mouth daily.   12/17/2016 at  Unknown time  . DULoxetine (CYMBALTA) 60 MG capsule Take 60 mg by mouth at bedtime.   12/16/2016 at Unknown time  . EPINEPHrine (EPIPEN) 0.3 mg/0.3 mL DEVI Inject 0.3 mg into the muscle once as needed (severe allergic reaction).    several years ago  . ferrous sulfate 325 (65 FE) MG tablet Take 325 mg by mouth daily with breakfast.   12/17/2016 at Unknown time  . fish oil-omega-3 fatty acids 1000 MG capsule Take 2 g by mouth 2 (two) times daily.    12/17/2016 at am  . furosemide (LASIX) 40 MG tablet Take 40 mg by mouth daily.    12/17/2016 at Unknown time  . Melatonin 1 MG TABS Take 1 mg by mouth at bedtime.   12/16/2016 at Unknown time  . nitroGLYCERIN (NITROSTAT) 0.4 MG SL tablet Place 0.4 mg under the tongue every 5 (five) minutes as needed for chest pain.   12/17/2016 at 1330  . predniSONE (DELTASONE) 5 MG tablet Take 5 mg by mouth daily.     12/17/2016 at Unknown time  . QUEtiapine (SEROQUEL) 100 MG tablet Take 100 mg by mouth at  bedtime.   12/16/2016 at Unknown time  . tacrolimus (PROGRAF) 0.5 MG capsule Take 1 mg by mouth 2 (two) times daily.    12/17/2016 at 1100  . traZODone (DESYREL) 150 MG tablet Take 150 mg by mouth at bedtime.   12/16/2016 at Unknown time  . warfarin (COUMADIN) 4 MG tablet TAKE AS DIRECTED BY COUMADIN CLINIC. (Patient taking differently: TAKE 1 1/2 TABLETS ON MONDAY AT 11PM, TAKE 1 TABLET ON ALL OTHER DAYS OF THE WEEK AT 11PM OR  AS DIRECTED BY COUMADIN CLINIC.) 40 tablet 3 12/16/2016 at 2300  . atorvastatin (LIPITOR) 10 MG tablet Take 1 tablet (10 mg total) by mouth daily. (Patient not taking: Reported on 12/17/2016) 90 tablet 3 Not Taking at Unknown time    Assessment: 66 y.o. Male presents with fever and CP. Initially heparin was ordered for patient but cardiology d/c heparin and wants to restart warfarin (PTA for a fib).   Home dose: 4mg  daily except for 6mg  on Monday  INR remains therapeutic on home regimen at 2.30. Hemoglobin is 10.8 and platelets low at 58 (h/o idiopathic  thrombocytopenia). No bleeding noted. Antibiotic were started today, which could increase INR - will monitor.   Goal of Therapy:  INR 2-3 Monitor platelets by anticoagulation protocol: Yes   Plan:  Warfarin 4mg  daily except for 6mg  on Monday Daily INR  Monitor signs/symptoms of bleeding  Monitor platelet count  Demetrius Charity, PharmD Acute Care Pharmacy Resident  Pager: [phone] 12/18/2016

## 2016-12-19 ENCOUNTER — Inpatient Hospital Stay (HOSPITAL_COMMUNITY): Payer: Medicare Other

## 2016-12-19 DIAGNOSIS — I509 Heart failure, unspecified: Secondary | ICD-10-CM

## 2016-12-19 DIAGNOSIS — R079 Chest pain, unspecified: Secondary | ICD-10-CM

## 2016-12-19 LAB — NM MYOCAR MULTI W/SPECT W/WALL MOTION / EF
CHL CUP NUCLEAR SDS: 3
CHL CUP NUCLEAR SRS: 15
CHL CUP STRESS STAGE 1 GRADE: 0 %
CHL CUP STRESS STAGE 1 HR: 94 {beats}/min
CHL CUP STRESS STAGE 2 GRADE: 0 %
CHL CUP STRESS STAGE 2 SPEED: 0 mph
CHL CUP STRESS STAGE 3 GRADE: 0 %
CHL CUP STRESS STAGE 4 DBP: 82 mmHg
CHL CUP STRESS STAGE 4 SPEED: 0 mph
CSEPEDS: 0 s
CSEPPHR: 100 {beats}/min
CSEPPMHR: 63 %
Estimated workload: 1 METS
Exercise duration (min): 0 min
LHR: 0.37
LV dias vol: 153 mL (ref 62–150)
LV sys vol: 88 mL
MPHR: 157 {beats}/min
Peak BP: 124 mmHg
Percent HR: 77 %
Rest HR: 97 {beats}/min
SSS: 17
Stage 1 DBP: 80 mmHg
Stage 1 SBP: 128 mmHg
Stage 1 Speed: 0 mph
Stage 2 HR: 96 {beats}/min
Stage 3 HR: 104 {beats}/min
Stage 3 Speed: 0 mph
Stage 4 Grade: 0 %
Stage 4 HR: 100 {beats}/min
Stage 4 SBP: 124 mmHg
TID: 1.02

## 2016-12-19 LAB — HEMOGLOBIN A1C
HEMOGLOBIN A1C: 5.3 % (ref 4.8–5.6)
Mean Plasma Glucose: 105 mg/dL

## 2016-12-19 LAB — COMPREHENSIVE METABOLIC PANEL
ALT: 14 U/L — AB (ref 17–63)
AST: 15 U/L (ref 15–41)
Albumin: 3 g/dL — ABNORMAL LOW (ref 3.5–5.0)
Alkaline Phosphatase: 42 U/L (ref 38–126)
Anion gap: 11 (ref 5–15)
BILIRUBIN TOTAL: 1 mg/dL (ref 0.3–1.2)
BUN: 22 mg/dL — AB (ref 6–20)
CO2: 24 mmol/L (ref 22–32)
CREATININE: 1.49 mg/dL — AB (ref 0.61–1.24)
Calcium: 9.3 mg/dL (ref 8.9–10.3)
Chloride: 102 mmol/L (ref 101–111)
GFR calc Af Amer: 56 mL/min — ABNORMAL LOW (ref >60)
GFR, EST NON AFRICAN AMERICAN: 48 mL/min — AB (ref >60)
Glucose, Bld: 226 mg/dL — ABNORMAL HIGH (ref 65–99)
Potassium: 4.2 mmol/L (ref 3.5–5.1)
Sodium: 137 mmol/L (ref 135–145)
TOTAL PROTEIN: 6.2 g/dL — AB (ref 6.5–8.1)

## 2016-12-19 LAB — CBC
HCT: 30.7 % — ABNORMAL LOW (ref 39.0–52.0)
Hemoglobin: 10.2 g/dL — ABNORMAL LOW (ref 13.0–17.0)
MCH: 26.9 pg (ref 26.0–34.0)
MCHC: 33.2 g/dL (ref 30.0–36.0)
MCV: 81 fL (ref 78.0–100.0)
PLATELETS: 44 10*3/uL — AB (ref 150–400)
RBC: 3.79 MIL/uL — ABNORMAL LOW (ref 4.22–5.81)
RDW: 15.9 % — AB (ref 11.5–15.5)
WBC: 4.5 10*3/uL (ref 4.0–10.5)

## 2016-12-19 LAB — PROTIME-INR
INR: 2.84
PROTHROMBIN TIME: 30.4 s — AB (ref 11.4–15.2)

## 2016-12-19 MED ORDER — TECHNETIUM TC 99M TETROFOSMIN IV KIT
10.0000 | PACK | Freq: Once | INTRAVENOUS | Status: AC | PRN
  Administered 2016-12-19: 10 via INTRAVENOUS

## 2016-12-19 MED ORDER — TECHNETIUM TC 99M TETROFOSMIN IV KIT
30.0000 | PACK | Freq: Once | INTRAVENOUS | Status: AC | PRN
  Administered 2016-12-19: 30 via INTRAVENOUS

## 2016-12-19 MED ORDER — REGADENOSON 0.4 MG/5ML IV SOLN
0.4000 mg | Freq: Once | INTRAVENOUS | Status: AC
  Administered 2016-12-19: 0.4 mg via INTRAVENOUS
  Filled 2016-12-19: qty 5

## 2016-12-19 MED ORDER — CARVEDILOL 6.25 MG PO TABS
6.2500 mg | ORAL_TABLET | Freq: Two times a day (BID) | ORAL | Status: DC
  Administered 2016-12-19 – 2016-12-20 (×3): 6.25 mg via ORAL
  Filled 2016-12-19 (×3): qty 1

## 2016-12-19 MED ORDER — REGADENOSON 0.4 MG/5ML IV SOLN
INTRAVENOUS | Status: AC
  Administered 2016-12-19: 09:00:00
  Filled 2016-12-19: qty 5

## 2016-12-19 NOTE — Progress Notes (Signed)
[place] for Warfarin Indication: atrial fibrillation  Allergies  Allergen Reactions  . Grapefruit Concentrate Other (See Comments)    Pt has been told not to eat grapefruit  . Haloperidol Other (See Comments)    Too sedated  . Nsaids Other (See Comments)    Cannot take due to liver transplant  . Rifampin Other (See Comments)    Caused flushing  . Triazolam Other (See Comments)    Too sedated    Patient Measurements: Height: 5\' 10"  (177.8 cm) Weight: 204 lb 8 oz (92.8 kg) IBW/kg (Calculated) : 73  Vital Signs: Temp: 97.5 F (36.4 C) (02/11 0649) Temp Source: Oral (02/11 0649) BP: 129/107 (02/11 1014) Pulse Rate: 115 (02/11 1014)  Labs:  Recent Labs  12/17/16 1740 12/18/16 0515 12/18/16 0711 12/18/16 0743 12/18/16 0934 12/19/16 0415  HGB 12.3*  --   --  10.8*  --  10.2*  HCT 38.5*  --   --  33.8*  --  30.7*  PLT 77*  --   --  58*  --  44*  LABPROT 23.2* 25.7*  --   --   --  30.4*  INR 2.03 2.30  --   --   --  2.84  CREATININE 1.47*  --   --  1.71*  --  1.49*  TROPONINI  --   --  0.04*  --  0.04*  --     Estimated Creatinine Clearance: 58.1 mL/min (by C-G formula based on SCr of 1.49 mg/dL (H)).   Medical History: Past Medical History:  Diagnosis Date  . Atrial fibrillation ([place]) 08/2009  . CAD (coronary artery disease)    s/p CABG -- 1992  . ESRD (end stage renal disease) ([place])   . HLD (hyperlipidemia)   . HTN (hypertension)   . Idiopathic thrombocytopenia ([facility])     Medications:  Prescriptions Prior to Admission  Medication Sig Dispense Refill Last Dose  . acetaminophen (TYLENOL) 500 MG tablet Take 1,000 mg by mouth every 6 (six) hours as needed for headache (pain).   12/17/2016 at 1330  . allopurinol (ZYLOPRIM) 100 MG tablet Take 100 mg by mouth daily.   12/17/2016 at Unknown time  . carvedilol (COREG) 12.5 MG tablet TAKE 1 TABLET BY MOUTH TWICE DAILY. 60 tablet 11 12/17/2016 at 1100  . Cyanocobalamin (VITAMIN B12  PO) Take 1 tablet by mouth daily.   12/17/2016 at Unknown time  . DULoxetine (CYMBALTA) 60 MG capsule Take 60 mg by mouth at bedtime.   12/16/2016 at Unknown time  . EPINEPHrine (EPIPEN) 0.3 mg/0.3 mL DEVI Inject 0.3 mg into the muscle once as needed (severe allergic reaction).    several years ago  . ferrous sulfate 325 (65 FE) MG tablet Take 325 mg by mouth daily with breakfast.   12/17/2016 at Unknown time  . fish oil-omega-3 fatty acids 1000 MG capsule Take 2 g by mouth 2 (two) times daily.    12/17/2016 at am  . furosemide (LASIX) 40 MG tablet Take 40 mg by mouth daily.    12/17/2016 at Unknown time  . Melatonin 1 MG TABS Take 1 mg by mouth at bedtime.   12/16/2016 at Unknown time  . nitroGLYCERIN (NITROSTAT) 0.4 MG SL tablet Place 0.4 mg under the tongue every 5 (five) minutes as needed for chest pain.   12/17/2016 at 1330  . predniSONE (DELTASONE) 5 MG tablet Take 5 mg by mouth daily.     12/17/2016 at Unknown time  .  QUEtiapine (SEROQUEL) 100 MG tablet Take 100 mg by mouth at bedtime.   12/16/2016 at Unknown time  . tacrolimus (PROGRAF) 0.5 MG capsule Take 1 mg by mouth 2 (two) times daily.    12/17/2016 at 1100  . traZODone (DESYREL) 150 MG tablet Take 150 mg by mouth at bedtime.   12/16/2016 at Unknown time  . warfarin (COUMADIN) 4 MG tablet TAKE AS DIRECTED BY COUMADIN CLINIC. (Patient taking differently: TAKE 1 1/2 TABLETS ON MONDAY AT 11PM, TAKE 1 TABLET ON ALL OTHER DAYS OF THE WEEK AT 11PM OR  AS DIRECTED BY COUMADIN CLINIC.) 40 tablet 3 12/16/2016 at 2300  . atorvastatin (LIPITOR) 10 MG tablet Take 1 tablet (10 mg total) by mouth daily. (Patient not taking: Reported on 12/17/2016) 90 tablet 3 Not Taking at Unknown time    Assessment: 66 y.o. Male presents with fever and CP. Initially heparin was ordered for patient but cardiology d/c heparin and wants to restart warfarin (PTA for a fib).   Home dose: 4mg  daily except for 6mg  on Monday  INR is 2.84 today. Per MD, would like new INR goal of 2-2.5 in setting  of low platelet count to reduce risk of bleeding. No bleeding noted at this time.   Goal of Therapy:  INR 2-2.5 Monitor platelets by anticoagulation protocol: Yes   Plan:  Hold warfarin until INR < 2.5 in setting of low platelet count INR goal 2-2.5 Daily INR  Monitor signs/symptoms of bleeding  Monitor platelet count  Demetrius Charity, PharmD Acute Care Pharmacy Resident  Pager: [phone] 12/19/2016

## 2016-12-19 NOTE — Progress Notes (Signed)
Progress Note  Patient Name: [name] Date of Encounter: 12/19/2016  Primary Cardiologist: Dr. [name]  Subjective   Denies any chest discomfort or palpitations overnight. Seen in Nuclear Medicine for 1-day NST.   Inpatient Medications    Scheduled Meds: . regadenoson      . allopurinol  100 mg Oral Daily  . atorvastatin  10 mg Oral Daily  . azithromycin (ZITHROMAX) 500 MG IVPB  500 mg Intravenous Q24H  . carvedilol  3.125 mg Oral BID  . ceFEPime (MAXIPIME) IV  1 g Intravenous Q8H  . DULoxetine  60 mg Oral QHS  . ferrous sulfate  325 mg Oral Q breakfast  . hydrocortisone sod succinate (SOLU-CORTEF) inj  100 mg Intravenous Q8H  . Melatonin  3 mg Oral QHS  . omega-3 acid ethyl esters  2 g Oral BID  . QUEtiapine  100 mg Oral QHS  . regadenoson  0.4 mg Intravenous Once  . tacrolimus  1 mg Oral BID  . traZODone  150 mg Oral QHS  . vancomycin  750 mg Intravenous Q12H  . vitamin B-12  100 mcg Oral Daily  . warfarin  4 mg Oral Once per day on Sun Tue Wed Thu Fri Sat  . [START ON 12/20/2016] warfarin  6 mg Oral Q Mon-1800  . Warfarin - Pharmacist Dosing Inpatient   Does not apply q1800   Continuous Infusions: . sodium chloride Stopped (12/18/16 0030)   PRN Meds: acetaminophen, albuterol, morphine injection, nitroGLYCERIN, ondansetron (ZOFRAN) IV, zolpidem   Vital Signs    Vitals:   12/18/16 2225 12/19/16 0014 12/19/16 0649 12/19/16 0849  BP: 114/67 (!) 104/58 119/75 128/80  Pulse:  84 78   Resp:      Temp:  97.7 F (36.5 C) 97.5 F (36.4 C)   TempSrc:  Oral Oral   SpO2: 97% 98% 96%   Weight:   204 lb 8 oz (92.8 kg)   Height:        Intake/Output Summary (Last 24 hours) at 12/19/16 0904 Last data filed at 12/19/16 0135  Gross per 24 hour  Intake             1230 ml  Output             2425 ml  Net            -1195 ml   Filed Weights   12/17/16 2300 12/18/16 0500 12/19/16 0649  Weight: 204 lb (92.5 kg) 203 lb 11.2 oz (92.4 kg) 204 lb 8 oz (92.8 kg)     Telemetry    Not reviewed. Seen in Nuclear Medicine.  ECG    Atrial fibrillation, HR 129.  - Personally Reviewed  Physical Exam   General: Well developed, well nourished, Caucasian  male appearing in no acute distress. Head: Normocephalic, atraumatic.  Neck: Supple without bruits, JVD not elevated. Lungs:  Resp regular and unlabored, CTA without wheezing or rales. Heart: Irregularly irregular, S1, S2, no S3, S4, or murmur; no rub. Abdomen: Soft, non-tender, non-distended with normoactive bowel sounds. No hepatomegaly. No rebound/guarding. No obvious abdominal masses. Extremities: No clubbing, cyanosis, or edema. Distal pedal pulses are 2+ bilaterally. Neuro: Alert and oriented X 3. Moves all extremities spontaneously. Psych: Normal affect.  Labs    Chemistry Recent Labs Lab 12/17/16 1740 12/18/16 0743 12/19/16 0415  NA 138 138 137  K 4.2 4.0 4.2  CL 102 105 102  CO2 24 23 24   GLUCOSE 121* 132* 226*  BUN 16  16 22*  CREATININE 1.47* 1.71* 1.49*  CALCIUM 9.8 8.6* 9.3  PROT 7.0 6.1* 6.2*  ALBUMIN 3.8 3.1* 3.0*  AST 22 17 15   ALT 16* 14* 14*  ALKPHOS 55 43 42  BILITOT 1.8* 1.6* 1.0  GFRNONAA 49* 41* 48*  GFRAA 57* 47* 56*  ANIONGAP 12 10 11      Hematology Recent Labs Lab 12/17/16 1740 12/18/16 0743 12/19/16 0415  WBC 7.8 7.9 4.5  RBC 4.72 4.12* 3.79*  HGB 12.3* 10.8* 10.2*  HCT 38.5* 33.8* 30.7*  MCV 81.6 82.0 81.0  MCH 26.1 26.2 26.9  MCHC 31.9 32.0 33.2  RDW 16.0* 16.4* 15.9*  PLT 77* 58* 44*    Cardiac Enzymes Recent Labs Lab 12/18/16 0711 12/18/16 0934  TROPONINI 0.04* 0.04*    Recent Labs Lab 12/17/16 1748  TROPIPOC 0.02     BNP Recent Labs Lab 12/18/16 0740  BNP 254.4*     DDimer No results for input(s): DDIMER in the last 168 hours.   Radiology    Dg Chest 2 View  Result Date: 12/18/2016 CLINICAL DATA:  Heaviness in left chest. EXAM: CHEST  2 VIEW COMPARISON:  December 17, 2016 FINDINGS: Persistent cardiomegaly.  Persistent elevation of the right hemidiaphragm. Increased interstitial markings, left greater than right. No focal infiltrate. Probable atelectasis in the left base. Multiple nodular densities project over the left upper lung, not seen on previous studies. No other acute abnormalities. IMPRESSION: 1. The findings are most consistent with cardiomegaly and mild edema. 2. Nodular densities projected over the left upper lobe may be part of the broader process or artifactual as they were not seen on more remote comparison studies. Recommend attention on short-term follow-up. Electronically Signed   By: Dorise Bullion III M.D   On: 12/18/2016 12:37   Dg Chest Port 1 View  Result Date: 12/17/2016 CLINICAL DATA:  Chest pain and heaviness. EXAM: PORTABLE CHEST 1 VIEW COMPARISON:  11/22/2016 FINDINGS: Bilateral diffuse interstitial thickening. No pleural effusion or pneumothorax. No focal consolidation. Stable cardiomegaly. Prior CABG. No acute osseous abnormality. IMPRESSION: Cardiomegaly with mild pulmonary vascular congestion. Electronically Signed   By: Kathreen Devoid   On: 12/17/2016 18:04    Cardiac Studies   Echocardiogram: 07/2015 Study Conclusions  - Left ventricle: The cavity size was mildly dilated. Systolic   function was mildly reduced. The estimated ejection fraction was   in the range of 45% to 50%. Diffuse hypokinesis. The study is not   technically sufficient to allow evaluation of LV diastolic   function. Suspect diastolic dysfunction with severely elevated   filling pressures based on low medial and lateral e&' velocities   and medial E/e&' ratio of 30.4. - Aortic valve: Moderately calcified annulus. Trileaflet; mildly   thickened leaflets. There was mild regurgitation. - Mitral valve: There was mild regurgitation. Valve area by   continuity equation (using LVOT flow): 2.25 cm^2. - Left atrium: The atrium was moderately dilated. - Right ventricle: The cavity size was mildly dilated.  Wall   thickness was normal. Systolic function was mildly reduced.  Patient Profile     66 y.o. male with PMH of CAD (s/p CABG in 1992), ESRD (s/p kidney transplant), liver transplant, HTN, and HLD, and persistent atrial fibrillation  (on Coumadin) who presented to [facility] ED on 12/17/2016 with generalized weakness and chest pain.   Assessment & Plan    1. Chest Pain - presented with fever, chills, and chest discomfort meeting SIRS criteria with no evidence of infection identified.  -  troponin values have been flat at 0.04 and EKG is without acute ischemic changes.  - seen in Nuclear Medicine for 1-day NST. Official results pending following stress imaging. Being read by [facility].   2. CAD - s/p CABG in 1992. - continue statin and BB. No ASA secondary to need for Coumadin.   3. Persistent Atrial Fibrillation - This patients CHA2DS2-VASc Score and unadjusted Ischemic Stroke Rate (% per year) is equal to 2.2 % stroke rate/year from a score of 2 (HTN, CAD). Continue Coumadin for anticoagulation.  - continue Coreg 3.125mg  BID for rate-control. Consider increasing to 6.25mg  BID for increased rate-control if BP allows.   4. Stage 3 CKD - creatinine stable at 1.49.  5. History of Liver and Kidney Transplant  - followed at [facility].  - has been continued on home immunosuppressant therapy.    Arna Medici , PA-C 9:04 AM 12/19/2016 Pager: [phone]   Patient interviewed and examined. Myoview scan is read by Dr. Jamesetta So. No change in 2016. Patient can be discharged.  His atrial fibrillation rate is fast. As noted above we will increase carvedilol.   We will plan to see him in the A. fib clinic in about 3 weeks for rate control evaluation

## 2016-12-19 NOTE — Progress Notes (Signed)
  Reviewed stress test results with the patient:    T wave inversion was noted at baseline and during stress in the II, V5 and V6 leads. T wave inversion persisted.  Nuclear stress EF: 43%. Diffuse hypokinesis  The left ventricular ejection fraction is moderately decreased (30-44%).  Defect 1: There is a defect present in the basal inferior, mid inferior, mid anterolateral, apical septal, apical inferior and apex location.  This perfusion defect was mostly fixed however there is a mild degree of periinfarct ischemia present. 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  When compared to prior report from 2016, this stress test is very similar.  Similar to prior images in 2016. Mild peri-infarct ischemia but no significant changes. Will increase BB dosing for atrial fibrillation as mentioned in previous progress note. No further inpatient evaluation planned. Admitting team made aware. A staff message has been sent to arrange follow-up in the atrial fibrillation clinic.   Signed, Erma Heritage, PA-C 12/19/2016, 12:45 PM Pager: [phone]

## 2016-12-19 NOTE — Progress Notes (Signed)
PROGRESS NOTE                                                                                                                                                                                                             Patient Demographics:    [name], is a 66 y.o. male, DOB - 30-Jul-1951, XMI:[id number]  Admit date - 12/17/2016   Admitting Physician Ivor Costa, MD  Outpatient Primary MD for the patient is [name]., MD  LOS - 2  Chief Complaint  Patient presents with  . Shortness of Breath       Brief Narrative  [name] is a 66 y.o. male with medical history significant of hypertension, hyperlipidemia, gout, depression, idiopathic thrombocytopenia, CAD, myocardial infarction 3, CABG 1991, atrial fibrillation on Coumadin, liver transplantation, kidney transplantation, sCHF with EF of 45-50 percent, CKD (baselin Cre 1.3 per pt), presented with mild SOB and fevers, some atypical chest pain.   Subjective:    [name] today has, No headache, No chest pain, No abdominal pain - No Nausea, No new weakness tingling or numbness, No Cough - SOB.     Assessment  & Plan :     1. Atypical chest discomfort with orthopnea and shortness of breath. Due to acute on chronic systolic CHF. Last EF 45% in 2016. BNP elevated, chest x-ray has bilateral infiltrates likely pulmonary edema, since blood pressure is low will drop Coreg dose, IV Lasix, oxygen and nebulizer treatments. Fluid and salt restriction. Check echocardiogram to evaluate EF and wall motion. If blood pressure improves we will try to add long-acting nitrate and if possible low-dose ACE.Cardiology following.  2. History of liver and kidney transplant. He follows at [facility] for his transplant care.  Currently renal function, liver enzymes are all stable, he has no abdominal pain, he did have nonspecific low-grade fever at home but currently appears nontoxic and stable. Continue home you  know suppressive medications as before. His blood pressure is soft will place him on stress dose steroids for a few days. Stop all antibiotics on 12/19/2016.  3. Suspected sepsis on admission. No source of the same, chest x-ray nonspecific, UA unremarkable, he has no productive cough, no abdominal pain, no dysuria or diarrhea, no skin rashes or joint effusions. No joint pains. Discussed his case with ID physician Dr. Megan Salon. For now we will monitor him clinically. Cultures are pending. He appears nontoxic. All antibiotics stopped to 09/27/2017.  4. Dyslipidemia. Continue statin.  5. History of gout. Continue allopurinol.  6. ITP. We will monitor platelet count close to 40,000, currently on Coumadin, avoid antiplatelet agents, INR is 2.8 on 12/19/2016 Will hold Coumadin in the light of low platelet count. No bleeding.  7.Chr. A. fib. [place] vasc 2 score of at least 3. Continue beta blocker and Coumadin, pharmacy monitoring INR. In mild ER, being followed by Dr. Cleda Mccreedy patient's primary EP in the hospital.  8. Mild ARF on CKD 4 - baseline creat close to 1.4, likely from CHF diurese.  9. CAD. Chest pain-free. EKG nonacute. Troponin stable. Continue combination of beta blocker, statin was secondary prevention, not on aspirin as he is on Coumadin. Per cardiology underwent Lexiscan on 12/19/2016.    Diet : Diet Heart Room service appropriate? Yes; Fluid consistency: Thin    Family Communication  :    Code Status :  DNR  Disposition Plan  :  Home 2-3 days  Consults  :  ID over the phone Dr Megan Salon, Cards  Procedures  :    Lexiscan -   TTE -   DVT Prophylaxis  :  Coumadin  Lab Results  Component Value Date   PLT 44 (L) 12/19/2016   Lab Results  Component Value Date   INR 2.84 12/19/2016   INR  2.30 12/18/2016   INR 2.03 12/17/2016    Inpatient Medications  Scheduled Meds: . regadenoson      . allopurinol  100 mg Oral Daily  . atorvastatin  10 mg Oral Daily  . carvedilol  3.125 mg Oral BID  . DULoxetine  60 mg Oral QHS  . ferrous sulfate  325 mg Oral Q breakfast  . hydrocortisone sod succinate (SOLU-CORTEF) inj  100 mg Intravenous Q8H  . Melatonin  3 mg Oral QHS  . omega-3 acid ethyl esters  2 g Oral BID  . QUEtiapine  100 mg Oral QHS  . tacrolimus  1 mg Oral BID  . traZODone  150 mg Oral QHS  . vitamin B-12  100 mcg Oral Daily  . warfarin  4 mg Oral Once per day on Sun Tue Wed Thu Fri Sat  . [START ON 12/20/2016] warfarin  6 mg Oral Q Mon-1800  . Warfarin - Pharmacist Dosing Inpatient   Does not apply q1800   Continuous Infusions: . sodium chloride Stopped (12/18/16 0030)   PRN Meds:.acetaminophen, albuterol, morphine injection, nitroGLYCERIN, ondansetron (ZOFRAN) IV, zolpidem  Antibiotics  :    Anti-infectives    Start     Dose/Rate Route Frequency Ordered Stop   12/18/16 1800  cefTRIAXone (ROCEPHIN) 1 g in dextrose 5 % 50 mL IVPB  Status:  Discontinued     1 g 100 mL/hr over 30 Minutes Intravenous Every 24 hours 12/17/16 2144 12/18/16 0235   12/18/16 1800  azithromycin (ZITHROMAX) 500 mg in dextrose 5 % 250 mL IVPB  Status:  Discontinued     500 mg 250 mL/hr over 60  Minutes Intravenous Every 24 hours 12/17/16 2144 12/19/16 1103   12/18/16 1400  vancomycin (VANCOCIN) IVPB 750 mg/150 ml premix  Status:  Discontinued     750 mg 150 mL/hr over 60 Minutes Intravenous Every 12 hours 12/18/16 0241 12/19/16 1103   12/18/16 0400  ceFEPIme (MAXIPIME) 1 g in dextrose 5 % 50 mL IVPB  Status:  Discontinued     1 g 100 mL/hr over 30 Minutes Intravenous Every 8 hours 12/18/16 0241 12/19/16 1103   12/18/16 0245  vancomycin (VANCOCIN) IVPB 750 mg/150 ml premix     750 mg 150 mL/hr over 60 Minutes Intravenous  Once 12/18/16 0241 12/18/16 0745   12/17/16 1830  cefTRIAXone  (ROCEPHIN) 1 g in dextrose 5 % 50 mL IVPB     1 g 100 mL/hr over 30 Minutes Intravenous  Once 12/17/16 1824 12/17/16 1916   12/17/16 1830  azithromycin (ZITHROMAX) 500 mg in dextrose 5 % 250 mL IVPB     500 mg 250 mL/hr over 60 Minutes Intravenous  Once 12/17/16 1824 12/17/16 2015         Objective:   Vitals:   12/19/16 0910 12/19/16 0911 12/19/16 0912 12/19/16 1014  BP: 117/80 127/78 124/82 (!) 129/107  Pulse:    (!) 115  Resp:      Temp:      TempSrc:      SpO2:      Weight:      Height:        Wt Readings from Last 3 Encounters:  12/19/16 92.8 kg (204 lb 8 oz)  10/28/16 93.6 kg (206 lb 6.4 oz)  10/22/15 95 kg (209 lb 6 oz)     Intake/Output Summary (Last 24 hours) at 12/19/16 1104 Last data filed at 12/19/16 0135  Gross per 24 hour  Intake             1180 ml  Output             2425 ml  Net            -1245 ml     Physical Exam  Awake Alert, Oriented X 3, No new F.N deficits, Normal affect [place].AT,PERRAL Supple Neck,No JVD, No cervical lymphadenopathy appriciated.  Symmetrical Chest wall movement, Good air movement bilaterally, CTAB RRR,No Gallops,Rubs or new Murmurs, No Parasternal Heave +ve B.Sounds, Abd Soft, No tenderness, No organomegaly appriciated, No rebound - guarding or rigidity. No Cyanosis, Clubbing or edema, No new Rash or bruise     Data Review:    CBC  Recent Labs Lab 12/17/16 1740 12/18/16 0743 12/19/16 0415  WBC 7.8 7.9 4.5  HGB 12.3* 10.8* 10.2*  HCT 38.5* 33.8* 30.7*  PLT 77* 58* 44*  MCV 81.6 82.0 81.0  MCH 26.1 26.2 26.9  MCHC 31.9 32.0 33.2  RDW 16.0* 16.4* 15.9*  LYMPHSABS 0.4*  --   --   MONOABS 0.4  --   --   EOSABS 0.1  --   --   BASOSABS 0.0  --   --     Chemistries   Recent Labs Lab 12/17/16 1740 12/18/16 0743 12/19/16 0415  NA 138 138 137  K 4.2 4.0 4.2  CL 102 105 102  CO2 24 23 24   GLUCOSE 121* 132* 226*  BUN 16 16 22*  CREATININE 1.47* 1.71* 1.49*  CALCIUM 9.8 8.6* 9.3  AST 22 17 15   ALT 16*  14* 14*  ALKPHOS 55 43 42  BILITOT 1.8* 1.6* 1.0   ------------------------------------------------------------------------------------------------------------------  Recent Labs  12/18/16 0515  CHOL 175  HDL 25*  LDLCALC 129*  TRIG 105  CHOLHDL 7.0    No results found for: HGBA1C ------------------------------------------------------------------------------------------------------------------ No results for input(s): TSH, T4TOTAL, T3FREE, THYROIDAB in the last 72 hours.  Invalid input(s): FREET3 ------------------------------------------------------------------------------------------------------------------ No results for input(s): VITAMINB12, FOLATE, FERRITIN, TIBC, IRON, RETICCTPCT in the last 72 hours.  Coagulation profile  Recent Labs Lab 12/17/16 1740 12/18/16 0515 12/19/16 0415  INR 2.03 2.30 2.84    No results for input(s): DDIMER in the last 72 hours.  Cardiac Enzymes  Recent Labs Lab 12/18/16 0711 12/18/16 0934  TROPONINI 0.04* 0.04*   ------------------------------------------------------------------------------------------------------------------    Component Value Date/Time   BNP 254.4 (H) 12/18/2016 0740    Micro Results  Recent Results (from the past 240 hour(s))  Blood Culture (routine x 2)     Status: None (Preliminary result)   Collection Time: 12/17/16  6:00 PM  Result Value Ref Range Status   Specimen Description BLOOD LEFT ANTECUBITAL  Final   Special Requests BOTTLES DRAWN AEROBIC AND ANAEROBIC 10CC  Final   Culture NO GROWTH < 24 HOURS  Final   Report Status PENDING  Incomplete  Blood Culture (routine x 2)     Status: None (Preliminary result)   Collection Time: 12/17/16  6:05 PM  Result Value Ref Range Status   Specimen Description BLOOD LEFT HAND  Final   Special Requests IN PEDIATRIC BOTTLE 2CC  Final   Culture NO GROWTH < 24 HOURS  Final   Report Status PENDING  Incomplete  MRSA PCR Screening     Status: None   Collection  Time: 12/17/16 11:17 PM  Result Value Ref Range Status   MRSA by PCR NEGATIVE NEGATIVE Final    Comment:        The GeneXpert MRSA Assay (FDA approved for NASAL specimens only), is one component of a comprehensive MRSA colonization surveillance program. It is not intended to diagnose MRSA infection nor to guide or monitor treatment for MRSA infections.     Radiology Reports  Dg Chest 2 View  Result Date: 12/18/2016 CLINICAL DATA:  Heaviness in left chest. EXAM: CHEST  2 VIEW COMPARISON:  December 17, 2016 FINDINGS: Persistent cardiomegaly. Persistent elevation of the right hemidiaphragm. Increased interstitial markings, left greater than right. No focal infiltrate. Probable atelectasis in the left base. Multiple nodular densities project over the left upper lung, not seen on previous studies. No other acute abnormalities. IMPRESSION: 1. The findings are most consistent with cardiomegaly and mild edema. 2. Nodular densities projected over the left upper lobe may be part of the broader process or artifactual as they were not seen on more remote comparison studies. Recommend attention on short-term follow-up. Electronically Signed   By: Dorise Bullion III M.D   On: 12/18/2016 12:37   Dg Chest Port 1 View  Result Date: 12/17/2016 CLINICAL DATA:  Chest pain and heaviness. EXAM: PORTABLE CHEST 1 VIEW COMPARISON:  11/22/2016 FINDINGS: Bilateral diffuse interstitial thickening. No pleural effusion or pneumothorax. No focal consolidation. Stable cardiomegaly. Prior CABG. No acute osseous abnormality. IMPRESSION: Cardiomegaly with mild pulmonary vascular congestion. Electronically Signed   By: Kathreen Devoid   On: 12/17/2016 18:04    Time Spent in minutes  30   Lala Lund K M.D on 12/19/2016 at 11:04 AM  Between 7am to 7pm - Pager - [phone]  After 7pm go to [email or web address] - password [facility]  [facility] -  Office  [phone]

## 2016-12-20 DIAGNOSIS — D693 Immune thrombocytopenic purpura: Secondary | ICD-10-CM

## 2016-12-20 DIAGNOSIS — R0789 Other chest pain: Secondary | ICD-10-CM

## 2016-12-20 DIAGNOSIS — M109 Gout, unspecified: Secondary | ICD-10-CM

## 2016-12-20 DIAGNOSIS — I130 Hypertensive heart and chronic kidney disease with heart failure and stage 1 through stage 4 chronic kidney disease, or unspecified chronic kidney disease: Principal | ICD-10-CM

## 2016-12-20 DIAGNOSIS — I481 Persistent atrial fibrillation: Secondary | ICD-10-CM

## 2016-12-20 DIAGNOSIS — I480 Paroxysmal atrial fibrillation: Secondary | ICD-10-CM

## 2016-12-20 DIAGNOSIS — I4891 Unspecified atrial fibrillation: Secondary | ICD-10-CM

## 2016-12-20 DIAGNOSIS — R079 Chest pain, unspecified: Secondary | ICD-10-CM

## 2016-12-20 LAB — ECHOCARDIOGRAM COMPLETE
Height: 70 in
Weight: 3272 oz

## 2016-12-20 LAB — POCT INR: INR: 2.4

## 2016-12-20 LAB — PROTIME-INR
INR: 2.69
PROTHROMBIN TIME: 29.2 s — AB (ref 11.4–15.2)

## 2016-12-20 MED ORDER — FUROSEMIDE 40 MG PO TABS: 40.0000 mg | ORAL_TABLET | Freq: Every day | ORAL | Status: DC

## 2016-12-20 MED ORDER — PREDNISONE 5 MG PO TABS: ORAL_TABLET | ORAL | 0 refills | Status: DC

## 2016-12-20 MED ORDER — CARVEDILOL 6.25 MG PO TABS: 6.2500 mg | ORAL_TABLET | Freq: Two times a day (BID) | ORAL | 0 refills | Status: DC

## 2016-12-20 MED ORDER — PREDNISONE 5 MG PO TABS: 5.0000 mg | ORAL_TABLET | Freq: Every day | ORAL | Status: DC

## 2016-12-20 MED ORDER — WARFARIN SODIUM 2 MG PO TABS: 2.0000 mg | ORAL_TABLET | Freq: Once | ORAL | Status: DC

## 2016-12-20 NOTE — Discharge Instructions (Signed)
Follow with Primary MD [name]., MD in 2 days   Get CBC, CMP, INR, 2 view Chest X ray checked  by Primary MD in 2 days ( we routinely change or add medications that can affect your baseline labs and fluid status, therefore we recommend that you get the mentioned basic workup next visit with your PCP, your PCP may decide not to get them or add new tests based on their clinical decision)  Activity: As tolerated with Full fall precautions use walker/cane & assistance as needed  Disposition Home    Diet: Heart Healthy    For Heart failure patients - Check your Weight same time everyday, if you gain over 2 pounds, or you develop in leg swelling, experience more shortness of breath or chest pain, call your Primary MD immediately. Follow Cardiac Low Salt Diet and 1.5 lit/day fluid restriction.  On your next visit with your primary care physician please Get Medicines reviewed and adjusted.  Please request your Prim.MD to go over all Hospital Tests and Procedure/Radiological results at the follow up, please get all Hospital records sent to your Prim MD by signing hospital release before you go home.  If you experience worsening of your admission symptoms, develop shortness of breath, life threatening emergency, suicidal or homicidal thoughts you must seek medical attention immediately by calling 911 or calling your MD immediately  if symptoms less severe.  You Must read complete instructions/literature along with all the possible adverse reactions/side effects for all the Medicines you take and that have been prescribed to you. Take any new Medicines after you have completely understood and accpet all the possible adverse reactions/side effects.   Do not drive, operate heavy machinery, perform activities at heights, swimming or participation in water activities or provide baby sitting services if your were admitted for syncope or siezures until you have seen by Primary MD or a Neurologist and  advised to do so again.  Do not drive when taking Pain medications.    Do not take more than prescribed Pain, Sleep and Anxiety Medications  Special Instructions: If you have smoked or chewed Tobacco  in the last 2 yrs please stop smoking, stop any regular Alcohol  and or any Recreational drug use.  Wear Seat belts while driving.   Please note  You were cared for by a hospitalist during your hospital stay. If you have any questions about your discharge medications or the care you received while you were in the hospital after you are discharged, you can call the unit and asked to speak with the hospitalist on call if the hospitalist that took care of you is not available. Once you are discharged, your primary care physician will handle any further medical issues. Please note that NO REFILLS for any discharge medications will be authorized once you are discharged, as it is imperative that you return to your primary care physician (or establish a relationship with a primary care physician if you do not have one) for your aftercare needs so that they can reassess your need for medications and monitor your lab values.

## 2016-12-20 NOTE — Progress Notes (Signed)
Progress Note  Patient Name: [name] Date of Encounter: 12/20/2016  Primary Cardiologist: Dr. [name]  Subjective   No complaints wants to go home .   Inpatient Medications    Scheduled Meds: . allopurinol  100 mg Oral Daily  . atorvastatin  10 mg Oral Daily  . carvedilol  6.25 mg Oral BID  . DULoxetine  60 mg Oral QHS  . ferrous sulfate  325 mg Oral Q breakfast  . hydrocortisone sod succinate (SOLU-CORTEF) inj  100 mg Intravenous Q8H  . Melatonin  3 mg Oral QHS  . omega-3 acid ethyl esters  2 g Oral BID  . QUEtiapine  100 mg Oral QHS  . tacrolimus  1 mg Oral BID  . traZODone  150 mg Oral QHS  . vitamin B-12  100 mcg Oral Daily  . Warfarin - Pharmacist Dosing Inpatient   Does not apply q1800   Continuous Infusions: . sodium chloride Stopped (12/18/16 0030)   PRN Meds: acetaminophen, albuterol, morphine injection, nitroGLYCERIN, ondansetron (ZOFRAN) IV, zolpidem   Vital Signs    Vitals:   12/19/16 2037 12/20/16 0427 12/20/16 0626 12/20/16 0741  BP: 123/82 124/88  122/80  Pulse: 98 92  94  Resp:  20  18  Temp: 98.7 F (37.1 C) 97.6 F (36.4 C)  97.9 F (36.6 C)  TempSrc: Oral Oral  Oral  SpO2: 98% 99%  97%  Weight:   204 lb 1.6 oz (92.6 kg)   Height:        Intake/Output Summary (Last 24 hours) at 12/20/16 0749 Last data filed at 12/20/16 0600  Gross per 24 hour  Intake             1500 ml  Output             1550 ml  Net              -50 ml   Filed Weights   12/18/16 0500 12/19/16 0649 12/20/16 0626  Weight: 203 lb 11.2 oz (92.4 kg) 204 lb 8 oz (92.8 kg) 204 lb 1.6 oz (92.6 kg)    Telemetry    Not reviewed. Seen in Nuclear Medicine.  ECG    Atrial fibrillation, HR 129.  - Personally Reviewed  Physical Exam   General: Well developed, well nourished, Caucasian  male appearing in no acute distress. Head: Normocephalic, atraumatic.  Neck: Supple without bruits, JVD not elevated. Lungs:  Resp regular and unlabored, CTA without wheezing  or rales. Heart: Irregularly irregular, S1, S2, no S3, S4, or murmur; no rub. Abdomen: Soft, non-tender, non-distended with normoactive bowel sounds. No hepatomegaly. No rebound/guarding. No obvious abdominal masses. Extremities: No clubbing, cyanosis, or edema. Distal pedal pulses are 2+ bilaterally. Neuro: Alert and oriented X 3. Moves all extremities spontaneously. Psych: Normal affect.  Labs    Chemistry  Recent Labs Lab 12/17/16 1740 12/18/16 0743 12/19/16 0415  NA 138 138 137  K 4.2 4.0 4.2  CL 102 105 102  CO2 24 23 24   GLUCOSE 121* 132* 226*  BUN 16 16 22*  CREATININE 1.47* 1.71* 1.49*  CALCIUM 9.8 8.6* 9.3  PROT 7.0 6.1* 6.2*  ALBUMIN 3.8 3.1* 3.0*  AST 22 17 15   ALT 16* 14* 14*  ALKPHOS 55 43 42  BILITOT 1.8* 1.6* 1.0  GFRNONAA 49* 41* 48*  GFRAA 57* 47* 56*  ANIONGAP 12 10 11      Hematology  Recent Labs Lab 12/17/16 1740 12/18/16 0743 12/19/16 0415  WBC  7.8 7.9 4.5  RBC 4.72 4.12* 3.79*  HGB 12.3* 10.8* 10.2*  HCT 38.5* 33.8* 30.7*  MCV 81.6 82.0 81.0  MCH 26.1 26.2 26.9  MCHC 31.9 32.0 33.2  RDW 16.0* 16.4* 15.9*  PLT 77* 58* 44*    Cardiac Enzymes  Recent Labs Lab 12/18/16 0711 12/18/16 0934  TROPONINI 0.04* 0.04*     Recent Labs Lab 12/17/16 1748  TROPIPOC 0.02     BNP  Recent Labs Lab 12/18/16 0740  BNP 254.4*     DDimer No results for input(s): DDIMER in the last 168 hours.   Radiology    Dg Chest 2 View  Result Date: 12/18/2016 CLINICAL DATA:  Heaviness in left chest. EXAM: CHEST  2 VIEW COMPARISON:  December 17, 2016 FINDINGS: Persistent cardiomegaly. Persistent elevation of the right hemidiaphragm. Increased interstitial markings, left greater than right. No focal infiltrate. Probable atelectasis in the left base. Multiple nodular densities project over the left upper lung, not seen on previous studies. No other acute abnormalities. IMPRESSION: 1. The findings are most consistent with cardiomegaly and mild edema. 2.  Nodular densities projected over the left upper lobe may be part of the broader process or artifactual as they were not seen on more remote comparison studies. Recommend attention on short-term follow-up. Electronically Signed   By: Dorise Bullion III M.D   On: 12/18/2016 12:37   Dg Chest Port 1 View  Result Date: 12/17/2016 CLINICAL DATA:  Chest pain and heaviness. EXAM: PORTABLE CHEST 1 VIEW COMPARISON:  11/22/2016 FINDINGS: Bilateral diffuse interstitial thickening. No pleural effusion or pneumothorax. No focal consolidation. Stable cardiomegaly. Prior CABG. No acute osseous abnormality. IMPRESSION: Cardiomegaly with mild pulmonary vascular congestion. Electronically Signed   By: Kathreen Devoid   On: 12/17/2016 18:04    Cardiac Studies   Echocardiogram: 07/2015 Study Conclusions  - Left ventricle: The cavity size was mildly dilated. Systolic   function was mildly reduced. The estimated ejection fraction was   in the range of 45% to 50%. Diffuse hypokinesis. The study is not   technically sufficient to allow evaluation of LV diastolic   function. Suspect diastolic dysfunction with severely elevated   filling pressures based on low medial and lateral e&' velocities   and medial E/e&' ratio of 30.4. - Aortic valve: Moderately calcified annulus. Trileaflet; mildly   thickened leaflets. There was mild regurgitation. - Mitral valve: There was mild regurgitation. Valve area by   continuity equation (using LVOT flow): 2.25 cm^2. - Left atrium: The atrium was moderately dilated. - Right ventricle: The cavity size was mildly dilated. Wall   thickness was normal. Systolic function was mildly reduced.  Patient Profile     66 y.o. male with PMH of CAD (s/p CABG in 1992), ESRD (s/p kidney transplant), liver transplant, HTN, and HLD, and persistent atrial fibrillation  (on Coumadin) who presented to [facility] ED on 12/17/2016 with generalized weakness and chest pain.   Assessment & Plan    1.  Chest Pain - myovue no change from 2016 plan medical RX   2. CAD - s/p CABG in 1992. - continue statin and BB. No ASA secondary to need for Coumadin.   3. Persistent Atrial Fibrillation - This patients CHA2DS2-VASc Score and unadjusted Ischemic Stroke Rate (% per year) is equal to 2.2 % stroke rate/year from a score of 2 (HTN, CAD). Continue Coumadin for anticoagulation.  - coreg increased to 6.25 bid f/u afib clinic per [facility]  4. Stage 3 CKD - creatinine stable  at 1.49.  5. History of Liver and Kidney Transplant  - followed at [facility].  - has been continued on home immunosuppressant therapy.    Rozann Lesches , PA-C 7:49 AM 12/20/2016 Pager: [phone]   Patient interviewed and examined. Myoview scan is read by Dr. Jamesetta So. No change in 2016. Patient can be discharged.  His atrial fibrillation rate is fast. As noted above we will increase carvedilol.   We will plan to see him in the A. fib clinic in about 3 weeks for rate control evaluation

## 2016-12-20 NOTE — Progress Notes (Signed)
[name] for Warfarin Indication: atrial fibrillation  Allergies  Allergen Reactions  . Grapefruit Concentrate Other (See Comments)    Pt has been told not to eat grapefruit  . Haloperidol Other (See Comments)    Too sedated  . Nsaids Other (See Comments)    Cannot take due to liver transplant  . Rifampin Other (See Comments)    Caused flushing  . Triazolam Other (See Comments)    Too sedated    Patient Measurements: Height: 5\' 10"  (177.8 cm) Weight: 204 lb 1.6 oz (92.6 kg) IBW/kg (Calculated) : 73  Vital Signs: Temp: 98 F (36.7 C) (02/12 1152) Temp Source: Oral (02/12 1152) BP: 124/86 (02/12 1152) Pulse Rate: 84 (02/12 1152)  Labs:  Recent Labs  12/17/16 1740 12/18/16 0515 12/18/16 0711 12/18/16 0743 12/18/16 0934 12/19/16 0415 12/20/16 0729  HGB 12.3*  --   --  10.8*  --  10.2*  --   HCT 38.5*  --   --  33.8*  --  30.7*  --   PLT 77*  --   --  58*  --  44*  --   LABPROT 23.2* 25.7*  --   --   --  30.4* 29.2*  INR 2.03 2.30  --   --   --  2.84 2.69  CREATININE 1.47*  --   --  1.71*  --  1.49*  --   TROPONINI  --   --  0.04*  --  0.04*  --   --     Estimated Creatinine Clearance: 58 mL/min (by C-G formula based on SCr of 1.49 mg/dL (H)).   Medical History: Past Medical History:  Diagnosis Date  . Atrial fibrillation ([place]) 08/2009  . CAD (coronary artery disease)    s/p CABG -- 1992  . ESRD (end stage renal disease) ([place])   . HLD (hyperlipidemia)   . HTN (hypertension)   . Idiopathic thrombocytopenia ([facility])     Medications:  Prescriptions Prior to Admission  Medication Sig Dispense Refill Last Dose  . acetaminophen (TYLENOL) 500 MG tablet Take 1,000 mg by mouth every 6 (six) hours as needed for headache (pain).   12/17/2016 at 1330  . allopurinol (ZYLOPRIM) 100 MG tablet Take 100 mg by mouth daily.   12/17/2016 at Unknown time  . carvedilol (COREG) 12.5 MG tablet TAKE 1 TABLET BY MOUTH TWICE DAILY. 60 tablet 11  12/17/2016 at 1100  . Cyanocobalamin (VITAMIN B12 PO) Take 1 tablet by mouth daily.   12/17/2016 at Unknown time  . DULoxetine (CYMBALTA) 60 MG capsule Take 60 mg by mouth at bedtime.   12/16/2016 at Unknown time  . EPINEPHrine (EPIPEN) 0.3 mg/0.3 mL DEVI Inject 0.3 mg into the muscle once as needed (severe allergic reaction).    several years ago  . ferrous sulfate 325 (65 FE) MG tablet Take 325 mg by mouth daily with breakfast.   12/17/2016 at Unknown time  . fish oil-omega-3 fatty acids 1000 MG capsule Take 2 g by mouth 2 (two) times daily.    12/17/2016 at am  . Melatonin 1 MG TABS Take 1 mg by mouth at bedtime.   12/16/2016 at Unknown time  . nitroGLYCERIN (NITROSTAT) 0.4 MG SL tablet Place 0.4 mg under the tongue every 5 (five) minutes as needed for chest pain.   12/17/2016 at 1330  . QUEtiapine (SEROQUEL) 100 MG tablet Take 100 mg by mouth at bedtime.   12/16/2016 at Unknown time  . tacrolimus (PROGRAF)  0.5 MG capsule Take 1 mg by mouth 2 (two) times daily.    12/17/2016 at 1100  . traZODone (DESYREL) 150 MG tablet Take 150 mg by mouth at bedtime.   12/16/2016 at Unknown time  . warfarin (COUMADIN) 4 MG tablet TAKE AS DIRECTED BY COUMADIN CLINIC. (Patient taking differently: TAKE 1 1/2 TABLETS ON MONDAY AT 11PM, TAKE 1 TABLET ON ALL OTHER DAYS OF THE WEEK AT 11PM OR  AS DIRECTED BY COUMADIN CLINIC.) 40 tablet 3 12/16/2016 at 2300  . [DISCONTINUED] furosemide (LASIX) 40 MG tablet Take 40 mg by mouth daily.    12/17/2016 at Unknown time  . [DISCONTINUED] predniSONE (DELTASONE) 5 MG tablet Take 5 mg by mouth daily.     12/17/2016 at Unknown time  . atorvastatin (LIPITOR) 10 MG tablet Take 1 tablet (10 mg total) by mouth daily. (Patient not taking: Reported on 12/17/2016) 90 tablet 3 Not Taking at Unknown time    Assessment: 66 y.o. Male presented with fever and CP on 12/17/16. Initially heparin was ordered for patient but cardiology d/c heparin and restarted warfarin (PTA for a fib).   Home dose: 4mg  daily except for 6mg   on Monday  INR 2.69 , warfarin held last PM due to INR above goal.   INR down form 2.84 yesterday.  No CBC today , Hgb low stable 102.,  pltc 44, (h/o ITP) . Not on heparin Per MD, would like new INR goal of 2-2.5 in setting of low platelet count to reduce risk of bleeding. No bleeding noted at this time.   Goal of Therapy:  INR 2-2.5 Monitor platelets by anticoagulation protocol: Yes   Plan:  Warfarin lower dose of 2mg  today 1 Daily INR  Monitor signs/symptoms of bleeding  Monitor platelet count  Nicole Cella, RPh Clinical Pharmacist Pager: [phone] 8A-4P [phone] 4P-10P [phone] [place] [phone] 12/20/2016 12:27 PM

## 2016-12-20 NOTE — Discharge Summary (Signed)
[name]:[id number] DOB: 1951-10-25 DOA: 12/17/2016  PCP: [name]., MD  Admit date: 12/17/2016  Discharge date: 12/20/2016  Admitted From: Home Disposition:  Home   Recommendations for Outpatient Follow-up:   Follow up with PCP in 1-2 weeks  PCP Please obtain BMP/CBC, 2 view CXR in 1week,  (see Discharge instructions)   PCP Please follow up on the following pending results: None   Home Health: None   Equipment/Devices: None  Consultations: Cards Discharge Condition: Fair   CODE STATUS: Full   Diet Recommendation:  Heart Healthy    Chief Complaint  Patient presents with  . Shortness of Breath     Brief history of present illness from the day of admission and additional interim summary    Martin H Cranfordis a 66 y.o.malewith medical history significant of hypertension, hyperlipidemia, gout, depression, idiopathic thrombocytopenia, CAD, myocardial infarction 3, CABG 1991, atrial fibrillation on Coumadin, liver transplantation, kidney transplantation, sCHF with EF of 45-50 percent, CKD (baselin Cre 1.3 per pt), presented with mild SOB and fevers, some atypical chest pain.                                                                  Hospital Course    1. Atypical chest discomfort with orthopnea and shortness of breath. Due to acute on chronic systolic CHF. Last EF 45% in 2016. BNP elevated, chest x-ray has bilateral infiltrates likely pulmonary edema, He was treated with Coreg dose, IV Lasix, oxygen and nebulizer treatments. Fluid and salt restriction. Blood pressure and renal function prohibit ACE/ARB, with diuresis he is back to his baseline, symptom-free without any shortness of breath. Was seen and cleared for home discharge by cardiology.  2. History of liver and kidney  transplant. He follows at [facility] for his transplant care. Currently renal function, liver enzymes are all stable, he has no abdominal pain, he did have nonspecific low-grade fever at home appears to be due to viral URI, completely resolved, he appeared nontoxic and stable. Continue home immunosuppressive medications as before for his transplant, he was kept antibiotics free for 36 hours without any fevers, cultures negative thus far. He is at baseline will be discharged home.  3. Suspected sepsis on admission. No source of the same, chest x-ray nonspecific, UA unremarkable, he has no productive cough, no abdominal pain, no dysuria or diarrhea, no skin rashes or joint effusions. No joint pains. Discussed his case with ID physician Dr. Megan Salon. Likely had viral infection at home. All cultures negative, completely symptom-free and nontoxic.  4. Dyslipidemia. Continue statin.  5. History of gout. Continue allopurinol.  6. ITP. Get his lab work from [facility], his platelet counts fluctuated between 35,000-100,000. No acute issues, no bleeding, requested to follow with PCP and primary oncologist in [place] within a week.  7.Chr. A. fib. [place] vasc 2 score of at least 3. Continue beta blocker and Coumadin, follow with primary cardiologist Dr. [name] after discharge, he was seen by him this admission.  8. Mild ARF on CKD 4 - baseline creat close to 1.4, likely from CHF , improved with diuresis continue home regimen upon discharge follow with PCP for repeat BMP.  9. CAD. Chest pain-free. EKG nonacute. Troponin stable. Continue combination of beta blocker, statin was secondary prevention, not on aspirin as he is on Coumadin. Per cardiology underwent Lexiscan on 12/19/2016 which showed old fixed defect without any new change.   Discharge diagnosis     Principal Problem:   Chest pain Active Problems:   Idiopathic thrombocytopenia ([facility])   HTN (hypertension)   HLD (hyperlipidemia)   CAD (coronary  artery disease)   Atrial fibrillation (HCC)   Gout   History of ITP   Chronic systolic CHF (congestive heart failure) (HCC)   Sepsis (HCC)   History of liver transplant ([place])   History of kidney transplant   Acute respiratory failure with hypoxia (HCC)   CKD (chronic kidney disease), stage III    Discharge instructions    Discharge Instructions    Diet - low sodium heart healthy    Complete by:  As directed    Discharge instructions    Complete by:  As directed    Follow with Primary MD [name]., MD in 2 days   Get CBC, CMP, INR, 2 view Chest X ray checked  by Primary MD in 2 days ( we routinely change or add medications that can affect your baseline labs and fluid status, therefore we recommend that you get the mentioned basic workup next visit with your PCP, your PCP may decide not to get them or add new tests based on their clinical decision)  Activity: As tolerated with Full fall precautions use walker/cane & assistance as needed  Disposition Home    Diet: Heart Healthy    For Heart failure patients - Check your Weight same time everyday, if you gain over 2 pounds, or you develop in leg swelling, experience more shortness of breath or chest pain, call your Primary MD immediately. Follow Cardiac Low Salt Diet and 1.5 lit/day fluid restriction.  On your next visit with your primary care physician please Get Medicines reviewed and adjusted.  Please request your Prim.MD to go over all Hospital Tests and Procedure/Radiological results at the follow up, please get all Hospital records sent to your Prim MD by signing hospital release before you go home.  If you experience worsening of your admission symptoms, develop shortness of breath, life threatening emergency, suicidal or homicidal thoughts you must seek medical attention immediately by calling 911 or calling your MD immediately  if symptoms less severe.  You Must read complete instructions/literature along with all the  possible adverse reactions/side effects for all the Medicines you take and that have been prescribed to you. Take any new Medicines after you have completely understood and accpet all the possible adverse reactions/side effects.   Do not drive, operate heavy machinery, perform activities at heights, swimming or participation in water activities or provide baby sitting services if your were admitted for syncope or siezures until you have seen by Primary MD or a Neurologist and advised to do so again.  Do not drive when taking Pain medications.    Do not take more than prescribed Pain, Sleep and Anxiety Medications  Special Instructions: If you have smoked or chewed  Tobacco  in the last 2 yrs please stop smoking, stop any regular Alcohol  and or any Recreational drug use.  Wear Seat belts while driving.   Please note  You were cared for by a hospitalist during your hospital stay. If you have any questions about your discharge medications or the care you received while you were in the hospital after you are discharged, you can call the unit and asked to speak with the hospitalist on call if the hospitalist that took care of you is not available. Once you are discharged, your primary care physician will handle any further medical issues. Please note that NO REFILLS for any discharge medications will be authorized once you are discharged, as it is imperative that you return to your primary care physician (or establish a relationship with a primary care physician if you do not have one) for your aftercare needs so that they can reassess your need for medications and monitor your lab values.   Increase activity slowly    Complete by:  As directed       Discharge Medications   Allergies as of 12/20/2016      Reactions   Grapefruit Concentrate Other (See Comments)   Pt has been told not to eat grapefruit   Haloperidol Other (See Comments)   Too sedated   Nsaids Other (See Comments)   Cannot take  due to liver transplant   Rifampin Other (See Comments)   Caused flushing   Triazolam Other (See Comments)   Too sedated      Medication List    TAKE these medications   acetaminophen 500 MG tablet Commonly known as:  TYLENOL Take 1,000 mg by mouth every 6 (six) hours as needed for headache (pain).   allopurinol 100 MG tablet Commonly known as:  ZYLOPRIM Take 100 mg by mouth daily.   atorvastatin 10 MG tablet Commonly known as:  LIPITOR Take 1 tablet (10 mg total) by mouth daily.   carvedilol 6.25 MG tablet Commonly known as:  COREG Take 1 tablet (6.25 mg total) by mouth 2 (two) times daily. What changed:  See the new instructions.   DULoxetine 60 MG capsule Commonly known as:  CYMBALTA Take 60 mg by mouth at bedtime.   EPIPEN 0.3 mg/0.3 mL Devi Generic drug:  EPINEPHrine Inject 0.3 mg into the muscle once as needed (severe allergic reaction).   ferrous sulfate 325 (65 FE) MG tablet Take 325 mg by mouth daily with breakfast.   fish oil-omega-3 fatty acids 1000 MG capsule Take 2 g by mouth 2 (two) times daily.   furosemide 40 MG tablet Commonly known as:  LASIX Take 1 tablet (40 mg total) by mouth daily. Start taking on:  12/21/2016   Melatonin 1 MG Tabs Take 1 mg by mouth at bedtime.   nitroGLYCERIN 0.4 MG SL tablet Commonly known as:  NITROSTAT Place 0.4 mg under the tongue every 5 (five) minutes as needed for chest pain.   predniSONE 5 MG tablet Commonly known as:  DELTASONE Label  & dispense according to the schedule below. take 6 Pills PO for 3 days, 4 Pills PO for 3 days, 2 Pills PO for 3 days, then continue your 5mg  daily dose as before. Total 40 pills. What changed:  You were already taking a medication with the same name, and this prescription was added. Make sure you understand how and when to take each.   predniSONE 5 MG tablet Commonly known as:  DELTASONE Take 1 tablet (  5 mg total) by mouth daily. Start taking on:  12/29/2016 What changed:   Another medication with the same name was added. Make sure you understand how and when to take each.   QUEtiapine 100 MG tablet Commonly known as:  SEROQUEL Take 100 mg by mouth at bedtime.   tacrolimus 0.5 MG capsule Commonly known as:  PROGRAF Take 1 mg by mouth 2 (two) times daily.   traZODone 150 MG tablet Commonly known as:  DESYREL Take 150 mg by mouth at bedtime.   VITAMIN B12 PO Take 1 tablet by mouth daily.   warfarin 4 MG tablet Commonly known as:  COUMADIN TAKE AS DIRECTED BY COUMADIN CLINIC. What changed:  See the new instructions.       Follow-up Information    Virl Axe, MD Follow up.   Specialty:  Cardiology Why:  The office will contact you to arrange follow-up.  Contact information: [address] [place] [address] [phone]        SPRY,HEATHER M., MD. Schedule an appointment as soon as possible for a visit in 2 day(s).   Specialty:  Family Medicine Why:  Also with your oncologist within a week Contact information: [address] [place] [address] [phone]           Major procedures and Radiology Reports - PLEASE review detailed and final reports thoroughly  -       Dg Chest 2 View  Result Date: 12/18/2016 CLINICAL DATA:  Heaviness in left chest. EXAM: CHEST  2 VIEW COMPARISON:  December 17, 2016 FINDINGS: Persistent cardiomegaly. Persistent elevation of the right hemidiaphragm. Increased interstitial markings, left greater than right. No focal infiltrate. Probable atelectasis in the left base. Multiple nodular densities project over the left upper lung, not seen on previous studies. No other acute abnormalities. IMPRESSION: 1. The findings are most consistent with cardiomegaly and mild edema. 2. Nodular densities projected over the left upper lobe may be part of the broader process or artifactual as they were not seen on more remote comparison studies. Recommend attention on short-term follow-up.  Electronically Signed   By: Dorise Bullion III M.D   On: 12/18/2016 12:37   Nm Myocar Multi W/spect Tamela Oddi Motion / Ef  Result Date: 12/19/2016  T wave inversion was noted at baseline and during stress in the II, V5 and V6 leads. T wave inversion persisted.  Nuclear stress EF: 43%. Diffuse hypokinesis  The left ventricular ejection fraction is moderately decreased (30-44%).  Defect 1: There is a defect present in the basal inferior, mid inferior, mid anterolateral, apical septal, apical inferior and apex location.  This perfusion defect was mostly fixed however there is a mild degree of periinfarct ischemia present. 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  When compared to prior report from 2016, this stress test is very similar.  Candee Furbish, MD   Dg Chest Port 1 View  Result Date: 12/17/2016 CLINICAL DATA:  Chest pain and heaviness. EXAM: PORTABLE CHEST 1 VIEW COMPARISON:  11/22/2016 FINDINGS: Bilateral diffuse interstitial thickening. No pleural effusion or pneumothorax. No focal consolidation. Stable cardiomegaly. Prior CABG. No acute osseous abnormality. IMPRESSION: Cardiomegaly with mild pulmonary vascular congestion. Electronically Signed   By: Kathreen Devoid   On: 12/17/2016 18:04    Micro Results     Recent Results (from the past 240 hour(s))  Blood Culture (routine x 2)     Status: None (Preliminary result)   Collection Time: 12/17/16  6:00 PM  Result Value Ref Range Status   Specimen Description BLOOD LEFT ANTECUBITAL  Final   Special Requests BOTTLES DRAWN AEROBIC AND ANAEROBIC 10CC  Final   Culture NO GROWTH 2 DAYS  Final   Report Status PENDING  Incomplete  Blood Culture (routine x 2)     Status: None (Preliminary result)   Collection Time: 12/17/16  6:05 PM  Result Value Ref Range Status   Specimen  Description BLOOD LEFT HAND  Final   Special Requests IN PEDIATRIC BOTTLE 2CC  Final   Culture NO GROWTH 2 DAYS  Final   Report Status PENDING  Incomplete  MRSA PCR Screening     Status: None   Collection Time: 12/17/16 11:17 PM  Result Value Ref Range Status   MRSA by PCR NEGATIVE NEGATIVE Final    Comment:        The GeneXpert MRSA Assay (FDA approved for NASAL specimens only), is one component of a comprehensive MRSA colonization surveillance program. It is not intended to diagnose MRSA infection nor to guide or monitor treatment for MRSA infections.     Today   Subjective    Whitten Andreoni today has no headache,no chest abdominal pain,no new weakness tingling or numbness, feels much better wants to go home today.     Objective   Blood pressure (!) 123/91, pulse 86, temperature 97.9 F (36.6 C), temperature source Oral, resp. rate 18, height 5\' 10"  (1.778 m), weight 92.6 kg (204 lb 1.6 oz), SpO2 97 %.   Intake/Output Summary (Last 24 hours) at 12/20/16 0927 Last data filed at 12/20/16 0600  Gross per 24 hour  Intake             1200 ml  Output             1550 ml  Net             -350 ml    Exam Awake Alert, Oriented x 3, No new F.N deficits, Normal affect .AT,PERRAL Supple Neck,No JVD, No cervical lymphadenopathy appriciated.  Symmetrical Chest wall movement, Good air movement bilaterally, CTAB iRRR,No Gallops,Rubs or new Murmurs, No Parasternal Heave +ve B.Sounds, Abd Soft, Non tender, No organomegaly appriciated, No rebound -guarding or rigidity. No Cyanosis, Clubbing or edema, No new Rash or bruise   Data Review   CBC w Diff: Lab Results  Component Value Date   WBC 4.5 12/19/2016   HGB 10.2 (L) 12/19/2016   HCT 30.7 (L) 12/19/2016   PLT 44 (L) 12/19/2016   LYMPHOPCT 5 12/17/2016   MONOPCT 5 12/17/2016   EOSPCT 2 12/17/2016   BASOPCT 0 12/17/2016    CMP: Lab Results  Component Value Date   NA 137 12/19/2016   K 4.2 12/19/2016   CL 102  12/19/2016   CO2 24 12/19/2016   BUN 22 (H) 12/19/2016   CREATININE 1.49 (H) 12/19/2016   PROT 6.2 (L) 12/19/2016   ALBUMIN 3.0 (L) 12/19/2016   BILITOT 1.0 12/19/2016   ALKPHOS 42 12/19/2016   AST 15 12/19/2016   ALT 14 (L) 12/19/2016   Lab Results  Component Value Date   INR 2.69 12/20/2016   INR 2.84 12/19/2016   INR 2.30 12/18/2016     Total Time in preparing paper work, data evaluation and todays exam - 35 minutes  Lala Lund K M.D on 12/20/2016 at 9:27 AM  [facility]  Office  [phone]

## 2016-12-21 ENCOUNTER — Ambulatory Visit (INDEPENDENT_AMBULATORY_CARE_PROVIDER_SITE_OTHER): Payer: Medicare Other | Admitting: Cardiovascular Disease

## 2016-12-21 DIAGNOSIS — Z5181 Encounter for therapeutic drug level monitoring: Secondary | ICD-10-CM

## 2016-12-22 LAB — CULTURE, BLOOD (ROUTINE X 2)
CULTURE: NO GROWTH
Culture: NO GROWTH

## 2016-12-24 LAB — POCT INR: INR: 2.9

## 2016-12-27 ENCOUNTER — Ambulatory Visit (INDEPENDENT_AMBULATORY_CARE_PROVIDER_SITE_OTHER): Payer: Medicare Other | Admitting: Interventional Cardiology

## 2016-12-27 DIAGNOSIS — Z5181 Encounter for therapeutic drug level monitoring: Secondary | ICD-10-CM

## 2017-01-03 ENCOUNTER — Ambulatory Visit (INDEPENDENT_AMBULATORY_CARE_PROVIDER_SITE_OTHER): Payer: Medicare Other | Admitting: Internal Medicine

## 2017-01-03 DIAGNOSIS — Z5181 Encounter for therapeutic drug level monitoring: Secondary | ICD-10-CM

## 2017-01-03 LAB — POCT INR: INR: 2.8

## 2017-01-06 ENCOUNTER — Ambulatory Visit (HOSPITAL_COMMUNITY)
Admission: RE | Admit: 2017-01-06 | Discharge: 2017-01-06 | Disposition: A | Discharge status: Home / Self Care | Payer: Medicare Other | Source: Ambulatory Visit | Attending: Nurse Practitioner | Admitting: Nurse Practitioner

## 2017-01-06 ENCOUNTER — Encounter (HOSPITAL_COMMUNITY): Payer: Self-pay | Admitting: Nurse Practitioner

## 2017-01-06 VITALS — BP 140/72 | HR 82 | Ht 70.0 in | Wt 205.0 lb

## 2017-01-06 DIAGNOSIS — Z888 Allergy status to other drugs, medicaments and biological substances status: Secondary | ICD-10-CM | POA: Insufficient documentation

## 2017-01-06 DIAGNOSIS — I2510 Atherosclerotic heart disease of native coronary artery without angina pectoris: Secondary | ICD-10-CM | POA: Insufficient documentation

## 2017-01-06 DIAGNOSIS — I252 Old myocardial infarction: Secondary | ICD-10-CM | POA: Insufficient documentation

## 2017-01-06 DIAGNOSIS — I272 Pulmonary hypertension, unspecified: Secondary | ICD-10-CM | POA: Diagnosis not present

## 2017-01-06 DIAGNOSIS — Z7901 Long term (current) use of anticoagulants: Secondary | ICD-10-CM | POA: Insufficient documentation

## 2017-01-06 DIAGNOSIS — Z951 Presence of aortocoronary bypass graft: Secondary | ICD-10-CM | POA: Insufficient documentation

## 2017-01-06 DIAGNOSIS — E785 Hyperlipidemia, unspecified: Secondary | ICD-10-CM | POA: Insufficient documentation

## 2017-01-06 DIAGNOSIS — Z8249 Family history of ischemic heart disease and other diseases of the circulatory system: Secondary | ICD-10-CM | POA: Insufficient documentation

## 2017-01-06 DIAGNOSIS — Z803 Family history of malignant neoplasm of breast: Secondary | ICD-10-CM | POA: Diagnosis not present

## 2017-01-06 DIAGNOSIS — Z7952 Long term (current) use of systemic steroids: Secondary | ICD-10-CM | POA: Diagnosis not present

## 2017-01-06 DIAGNOSIS — Z940 Kidney transplant status: Secondary | ICD-10-CM | POA: Insufficient documentation

## 2017-01-06 DIAGNOSIS — I4821 Permanent atrial fibrillation: Secondary | ICD-10-CM

## 2017-01-06 DIAGNOSIS — I10 Essential (primary) hypertension: Secondary | ICD-10-CM | POA: Diagnosis not present

## 2017-01-06 DIAGNOSIS — Z8051 Family history of malignant neoplasm of kidney: Secondary | ICD-10-CM | POA: Insufficient documentation

## 2017-01-06 DIAGNOSIS — Z944 Liver transplant status: Secondary | ICD-10-CM | POA: Insufficient documentation

## 2017-01-06 DIAGNOSIS — Z79899 Other long term (current) drug therapy: Secondary | ICD-10-CM | POA: Diagnosis not present

## 2017-01-06 DIAGNOSIS — I482 Chronic atrial fibrillation: Secondary | ICD-10-CM | POA: Insufficient documentation

## 2017-01-06 DIAGNOSIS — R0602 Shortness of breath: Secondary | ICD-10-CM | POA: Diagnosis not present

## 2017-01-06 DIAGNOSIS — D693 Immune thrombocytopenic purpura: Secondary | ICD-10-CM | POA: Diagnosis not present

## 2017-01-06 NOTE — Progress Notes (Signed)
Primary Care Physician: [name]., MD Referring Physician: [facility] f/u EP: Dr. Sabino Niemann is a 66 y.o. male with a h/o  significant of hypertension, hyperlipidemia, gout, depression, idiopathic thrombocytopenia, CAD, myocardial infarction 3, CABG 1991, atrial fibrillation on Coumadin, liver transplantation, kidney transplantation, sCHF with EF of 45-50 percent, CKD (baselin Cre 1.3 per pt),presented with mild SOB and fevers, some atypical chest pain. He was hospitalized 2/9 thru 2/12. He was treated with IV lasix, nebulizers  and oxygen. He has chronic afib and was seen by Dr. [name] who recommended to increase BB to control increased v rates. Echo done in the hospital showed EF at 40-45% with severe concentric hypertrophy, moderate pul hypertension.  He is being seen in the afib clinic to ensure good rate control. V rate today is 82 bpm. He states that he has always had some shortness of breath but feels that it has been worse for a couple of months. Has seen PCP since discharge and she is discussing a pulmonary consult. Weight is stable.   Today, he denies symptoms of palpitations, chest pain, shortness of breath, orthopnea, PND, lower extremity edema, dizziness, presyncope, syncope, or neurologic sequela. The patient is tolerating medications without difficulties and is otherwise without complaint today.   Past Medical History:  Diagnosis Date  . Atrial fibrillation ([place]) 08/2009  . CAD (coronary artery disease)    s/p CABG -- 1992  . ESRD (end stage renal disease) ([place])   . HLD (hyperlipidemia)   . HTN (hypertension)   . Idiopathic thrombocytopenia ([facility])    Past Surgical History:  Procedure Laterality Date  . APPENDECTOMY    . CORONARY ARTERY BYPASS GRAFT    . FACIAL COSMETIC SURGERY    . GALLBLADDER SURGERY    . HERNIA REPAIR    . KIDNEY TRANSPLANT    . LIVER TRANSPLANT      Current Outpatient Prescriptions  Medication Sig Dispense Refill  .  acetaminophen (TYLENOL) 500 MG tablet Take 1,000 mg by mouth every 6 (six) hours as needed for headache (pain).    [name] allopurinol (ZYLOPRIM) 100 MG tablet Take 100 mg by mouth daily.    [name] atorvastatin (LIPITOR) 10 MG tablet Take 1 tablet (10 mg total) by mouth daily. 90 tablet 3  . carvedilol (COREG) 6.25 MG tablet Take 1 tablet (6.25 mg total) by mouth 2 (two) times daily. 60 tablet 0  . Cyanocobalamin (VITAMIN B12 PO) Take 1 tablet by mouth daily.    . DULoxetine (CYMBALTA) 60 MG capsule Take 60 mg by mouth at bedtime.    [name] EPINEPHrine (EPIPEN) 0.3 mg/0.3 mL DEVI Inject 0.3 mg into the muscle once as needed (severe allergic reaction).     . ferrous sulfate 325 (65 FE) MG tablet Take 325 mg by mouth daily with breakfast.    . fish oil-omega-3 fatty acids 1000 MG capsule Take 2 g by mouth 2 (two) times daily.     . furosemide (LASIX) 40 MG tablet Take 1 tablet (40 mg total) by mouth daily. (Patient taking differently: Take 40 mg by mouth daily. 80 mg on Mondays) 30 tablet   . Melatonin 1 MG TABS Take 1 mg by mouth at bedtime.    . nitroGLYCERIN (NITROSTAT) 0.4 MG SL tablet Place 0.4 mg under the tongue every 5 (five) minutes as needed for chest pain.    . predniSONE (DELTASONE) 5 MG tablet Take 1 tablet (5 mg total) by mouth daily.    . QUEtiapine (  SEROQUEL) 100 MG tablet Take 100 mg by mouth at bedtime.    . tacrolimus (PROGRAF) 0.5 MG capsule Take 1 mg by mouth 2 (two) times daily.     . traZODone (DESYREL) 150 MG tablet Take 150 mg by mouth at bedtime.    [name] warfarin (COUMADIN) 4 MG tablet TAKE AS DIRECTED BY COUMADIN CLINIC. (Patient taking differently: TAKE 1 1/2 TABLETS ON MONDAY AT 11PM, TAKE 1 TABLET ON ALL OTHER DAYS OF THE WEEK AT 11PM OR  AS DIRECTED BY COUMADIN CLINIC.) 40 tablet 3   No current facility-administered medications for this encounter.     Allergies  Allergen Reactions  . Grapefruit Concentrate Other (See Comments)    Pt has been told not to eat grapefruit  .  Haloperidol Other (See Comments)    Too sedated  . Nsaids Other (See Comments)    Cannot take due to liver transplant  . Rifampin Other (See Comments)    Caused flushing  . Triazolam Other (See Comments)    Too sedated    Social History   Social History  . Marital status: Single    Spouse name: N/A  . Number of children: 0  . Years of education: N/A   Occupational History  . retired    Social History Main Topics  . Smoking status: Never Smoker  . Smokeless tobacco: Never Used  . Alcohol use No  . Drug use: No  . Sexual activity: Not on file   Other Topics Concern  . Not on file   Social History Narrative  . No narrative on file    Family History  Problem Relation Age of Onset  . Atrial fibrillation Mother   . Breast cancer Mother   . Renal cancer Father   . Hypertension Brother   . Hyperlipidemia Brother   . Other Brother     stent   . Renal Disease Maternal Grandmother   . Stroke Maternal Grandmother   . Heart attack Maternal Grandfather   . Cancer Paternal Grandmother   . Heart failure Paternal Grandfather   . Emphysema Paternal Grandfather   . Bronchiolitis Paternal Grandfather     ROS- All systems are reviewed and negative except as per the HPI above  Physical Exam: Vitals:   01/06/17 1334  BP: 140/72  Pulse: 82  SpO2: 94%  Weight: 205 lb (93 kg)  Height: 5\' 10"  (1.778 m)   Wt Readings from Last 3 Encounters:  01/06/17 205 lb (93 kg)  12/20/16 204 lb 1.6 oz (92.6 kg)  10/28/16 206 lb 6.4 oz (93.6 kg)    Labs: Lab Results  Component Value Date   NA 137 12/19/2016   K 4.2 12/19/2016   CL 102 12/19/2016   CO2 24 12/19/2016   GLUCOSE 226 (H) 12/19/2016   BUN 22 (H) 12/19/2016   CREATININE 1.49 (H) 12/19/2016   CALCIUM 9.3 12/19/2016   Lab Results  Component Value Date   INR 2.8 01/03/2017   Lab Results  Component Value Date   CHOL 175 12/18/2016   HDL 25 (L) 12/18/2016   LDLCALC 129 (H) 12/18/2016   TRIG 105 12/18/2016      GEN- The patient is well appearing, alert and oriented x 3 today.   Head- normocephalic, atraumatic Eyes-  Sclera clear, conjunctiva pink Ears- hearing intact Oropharynx- clear Neck- supple, no JVP Lymph- no cervical lymphadenopathy Lungs- Clear to ausculation bilaterally, normal work of breathing Heart- irregular rate and rhythm, no murmurs, rubs or gallops, PMI  not laterally displaced GI- soft, NT, ND, + BS Extremities- no clubbing, cyanosis, or edema MS- no significant deformity or atrophy Skin- no rash or lesion Psych- euthymic mood, full affect Neuro- strength and sensation are intact  EKG-afib at 82 bpm, qrs int 108 ms, qtc 462 ms Echo-- Left ventricle: The cavity size was moderately dilated. There was   severe concentric hypertrophy. Systolic function was mildly to   moderately reduced. The estimated ejection fraction was in the   range of 40% to 45%. There is akinesis of the entireapical   myocardium. There is akinesis of the entirelateral myocardium.   There is likely akinesis of the entire inferolateral myocardium   but endocardial segments are not well visualized. Apical 4   chamber views suspicious for LV thrombus. Patient needs repeat   limited study with definity contrast to define this area better. - Aortic valve: Moderately calcified annulus. Trileaflet; mildly   thickened, mildly calcified leaflets. There was mild to moderate   regurgitation. - Aorta: Aortic root dimension: 38 mm (ED). - Aortic arch: The aortic arch was mildly dilated. - Mitral valve: Calcified annulus. - Left atrium: The atrium was severely dilated. - Pulmonary arteries: PA peak pressure: 45 mm Hg (S).  Impressions:  - The right ventricular systolic pressure was increased consistent   with moderate pulmonary hypertension.  Recommendations:  Limited study with definity contrast to rule out apical LV thrombus.  Stress test- Rest Perfusion There is a defect present in the basal  inferior, basal inferolateral, mid inferior, mid inferolateral, apical septal and apical inferior location.    Stress Perfusion There is a defect present in the basal inferior, mid inferior, mid anterolateral, apical septal and apical inferior location.    Perfusion Summary Defect 1:  There is a defect present in the basal inferior, mid inferior, mid anterolateral, apical septal, apical inferior and apex location. The defect is partially reversible.    Overall Study Impression Myocardial perfusion is abnormal. This is an intermediate risk study. Overall left ventricular systolic function was abnormal. Nuclear stress EF: 43%. The left ventricular ejection fraction is moderately decreased (30-44%).         Assessment and Plan: 1. Permanent afib Had some RVR in hospital but is now controlled with increase of coreg from 3.125 to 6.25 mg bid Continue warfarin, 2.8 2/26  2. Chronic shortness of breath, per pt progressive Fluid/weight stable Continue lasix Daily weights Avoid salt Echo with mild LV dysfunction, severe LVH and moderate pulmonary hypertension, probably all contributing PCP is considering pulmonary consult  F/u afib clinic as needed F/u PCP as scheduled  Dr. [name] in 6 months   Geroge Baseman. Tishara Pizano, [facility] [address] [place], [place] [address] [phone]

## 2017-01-07 ENCOUNTER — Other Ambulatory Visit: Payer: Self-pay | Admitting: Internal Medicine

## 2017-01-07 DIAGNOSIS — I509 Heart failure, unspecified: Secondary | ICD-10-CM | POA: Diagnosis not present

## 2017-01-07 DIAGNOSIS — Z940 Kidney transplant status: Secondary | ICD-10-CM | POA: Diagnosis not present

## 2017-01-07 DIAGNOSIS — I4891 Unspecified atrial fibrillation: Secondary | ICD-10-CM

## 2017-01-08 DIAGNOSIS — Z940 Kidney transplant status: Secondary | ICD-10-CM | POA: Diagnosis not present

## 2017-01-08 DIAGNOSIS — I509 Heart failure, unspecified: Secondary | ICD-10-CM | POA: Diagnosis not present

## 2017-01-08 DIAGNOSIS — I4891 Unspecified atrial fibrillation: Secondary | ICD-10-CM | POA: Diagnosis not present

## 2017-01-09 DIAGNOSIS — Z940 Kidney transplant status: Secondary | ICD-10-CM | POA: Diagnosis not present

## 2017-01-09 DIAGNOSIS — I4891 Unspecified atrial fibrillation: Secondary | ICD-10-CM | POA: Diagnosis not present

## 2017-01-09 DIAGNOSIS — D696 Thrombocytopenia, unspecified: Secondary | ICD-10-CM | POA: Diagnosis not present

## 2017-01-09 DIAGNOSIS — I509 Heart failure, unspecified: Secondary | ICD-10-CM | POA: Diagnosis not present

## 2017-01-10 LAB — POCT INR: INR: 2.1

## 2017-01-11 ENCOUNTER — Ambulatory Visit (INDEPENDENT_AMBULATORY_CARE_PROVIDER_SITE_OTHER): Payer: Medicare Other | Admitting: Internal Medicine

## 2017-01-11 DIAGNOSIS — Z5181 Encounter for therapeutic drug level monitoring: Secondary | ICD-10-CM

## 2017-01-18 ENCOUNTER — Ambulatory Visit (INDEPENDENT_AMBULATORY_CARE_PROVIDER_SITE_OTHER): Payer: Medicare Other

## 2017-01-18 ENCOUNTER — Other Ambulatory Visit: Payer: Self-pay | Admitting: Internal Medicine

## 2017-01-18 DIAGNOSIS — Z5181 Encounter for therapeutic drug level monitoring: Secondary | ICD-10-CM

## 2017-01-18 LAB — POCT INR: INR: 2.5

## 2017-01-24 LAB — POCT INR: INR: 2.1

## 2017-01-25 ENCOUNTER — Ambulatory Visit (INDEPENDENT_AMBULATORY_CARE_PROVIDER_SITE_OTHER): Payer: Medicare Other | Admitting: Cardiovascular Disease

## 2017-01-25 DIAGNOSIS — Z5181 Encounter for therapeutic drug level monitoring: Secondary | ICD-10-CM

## 2017-01-31 ENCOUNTER — Ambulatory Visit (INDEPENDENT_AMBULATORY_CARE_PROVIDER_SITE_OTHER): Payer: Medicare Other | Admitting: Pharmacist

## 2017-01-31 DIAGNOSIS — D649 Anemia, unspecified: Secondary | ICD-10-CM

## 2017-01-31 DIAGNOSIS — D693 Immune thrombocytopenic purpura: Secondary | ICD-10-CM | POA: Diagnosis not present

## 2017-01-31 DIAGNOSIS — M8580 Other specified disorders of bone density and structure, unspecified site: Secondary | ICD-10-CM

## 2017-01-31 DIAGNOSIS — Z940 Kidney transplant status: Secondary | ICD-10-CM

## 2017-01-31 DIAGNOSIS — N189 Chronic kidney disease, unspecified: Secondary | ICD-10-CM | POA: Diagnosis not present

## 2017-01-31 DIAGNOSIS — Z5181 Encounter for therapeutic drug level monitoring: Secondary | ICD-10-CM

## 2017-01-31 DIAGNOSIS — Z944 Liver transplant status: Secondary | ICD-10-CM

## 2017-01-31 DIAGNOSIS — D472 Monoclonal gammopathy: Secondary | ICD-10-CM

## 2017-01-31 LAB — POCT INR: INR: 2.3

## 2017-02-07 LAB — POCT INR: INR: 2.9

## 2017-02-08 ENCOUNTER — Ambulatory Visit (INDEPENDENT_AMBULATORY_CARE_PROVIDER_SITE_OTHER): Payer: Self-pay | Admitting: Internal Medicine

## 2017-02-08 DIAGNOSIS — Z5181 Encounter for therapeutic drug level monitoring: Secondary | ICD-10-CM

## 2017-02-15 ENCOUNTER — Ambulatory Visit (INDEPENDENT_AMBULATORY_CARE_PROVIDER_SITE_OTHER): Payer: Medicare Other | Admitting: Interventional Cardiology

## 2017-02-15 DIAGNOSIS — Z5181 Encounter for therapeutic drug level monitoring: Secondary | ICD-10-CM

## 2017-02-15 LAB — POCT INR: INR: 2.3

## 2017-02-21 ENCOUNTER — Ambulatory Visit (INDEPENDENT_AMBULATORY_CARE_PROVIDER_SITE_OTHER): Payer: Medicare Other | Admitting: Pharmacist

## 2017-02-21 DIAGNOSIS — Z7901 Long term (current) use of anticoagulants: Secondary | ICD-10-CM

## 2017-02-21 DIAGNOSIS — Z5181 Encounter for therapeutic drug level monitoring: Secondary | ICD-10-CM

## 2017-02-21 LAB — POCT INR: INR: 1.9

## 2017-02-25 ENCOUNTER — Other Ambulatory Visit: Payer: Self-pay | Admitting: Internal Medicine

## 2017-03-01 ENCOUNTER — Ambulatory Visit (INDEPENDENT_AMBULATORY_CARE_PROVIDER_SITE_OTHER): Payer: Self-pay | Admitting: Internal Medicine

## 2017-03-01 DIAGNOSIS — Z5181 Encounter for therapeutic drug level monitoring: Secondary | ICD-10-CM

## 2017-03-01 DIAGNOSIS — Z7901 Long term (current) use of anticoagulants: Secondary | ICD-10-CM

## 2017-03-01 LAB — POCT INR: INR: 2.9

## 2017-03-08 ENCOUNTER — Ambulatory Visit (INDEPENDENT_AMBULATORY_CARE_PROVIDER_SITE_OTHER): Payer: Medicare Other | Admitting: Cardiovascular Disease

## 2017-03-08 DIAGNOSIS — Z5181 Encounter for therapeutic drug level monitoring: Secondary | ICD-10-CM

## 2017-03-08 DIAGNOSIS — Z7901 Long term (current) use of anticoagulants: Secondary | ICD-10-CM

## 2017-03-08 LAB — POCT INR: INR: 2.4

## 2017-03-15 ENCOUNTER — Ambulatory Visit (INDEPENDENT_AMBULATORY_CARE_PROVIDER_SITE_OTHER): Payer: Self-pay | Admitting: Internal Medicine

## 2017-03-15 DIAGNOSIS — Z5181 Encounter for therapeutic drug level monitoring: Secondary | ICD-10-CM

## 2017-03-15 DIAGNOSIS — Z7901 Long term (current) use of anticoagulants: Secondary | ICD-10-CM

## 2017-03-15 LAB — POCT INR: INR: 2.3

## 2017-03-22 LAB — POCT INR: INR: 2.1

## 2017-03-23 ENCOUNTER — Ambulatory Visit (INDEPENDENT_AMBULATORY_CARE_PROVIDER_SITE_OTHER): Payer: Medicare Other | Admitting: Cardiovascular Disease

## 2017-03-23 DIAGNOSIS — Z7901 Long term (current) use of anticoagulants: Secondary | ICD-10-CM

## 2017-03-23 DIAGNOSIS — Z5181 Encounter for therapeutic drug level monitoring: Secondary | ICD-10-CM

## 2017-03-30 ENCOUNTER — Ambulatory Visit (INDEPENDENT_AMBULATORY_CARE_PROVIDER_SITE_OTHER): Payer: Medicare Other | Admitting: Cardiovascular Disease

## 2017-03-30 DIAGNOSIS — Z5181 Encounter for therapeutic drug level monitoring: Secondary | ICD-10-CM

## 2017-03-30 DIAGNOSIS — Z7901 Long term (current) use of anticoagulants: Secondary | ICD-10-CM

## 2017-03-30 LAB — POCT INR: INR: 2.4

## 2017-04-06 LAB — POCT INR: INR: 2.9

## 2017-04-07 ENCOUNTER — Ambulatory Visit (INDEPENDENT_AMBULATORY_CARE_PROVIDER_SITE_OTHER): Payer: Medicare Other | Admitting: Cardiovascular Disease

## 2017-04-07 DIAGNOSIS — Z7901 Long term (current) use of anticoagulants: Secondary | ICD-10-CM

## 2017-04-07 DIAGNOSIS — Z5181 Encounter for therapeutic drug level monitoring: Secondary | ICD-10-CM

## 2017-04-13 LAB — POCT INR: INR: 2.5

## 2017-04-14 ENCOUNTER — Ambulatory Visit (INDEPENDENT_AMBULATORY_CARE_PROVIDER_SITE_OTHER): Payer: Medicare Other | Admitting: Cardiovascular Disease

## 2017-04-14 DIAGNOSIS — Z7901 Long term (current) use of anticoagulants: Secondary | ICD-10-CM

## 2017-04-14 DIAGNOSIS — Z5181 Encounter for therapeutic drug level monitoring: Secondary | ICD-10-CM

## 2017-04-20 LAB — POCT INR: INR: 2.6

## 2017-04-21 ENCOUNTER — Ambulatory Visit (INDEPENDENT_AMBULATORY_CARE_PROVIDER_SITE_OTHER): Payer: Medicare Other | Admitting: Cardiology

## 2017-04-21 DIAGNOSIS — Z7901 Long term (current) use of anticoagulants: Secondary | ICD-10-CM

## 2017-04-21 DIAGNOSIS — Z5181 Encounter for therapeutic drug level monitoring: Secondary | ICD-10-CM

## 2017-04-27 ENCOUNTER — Ambulatory Visit (INDEPENDENT_AMBULATORY_CARE_PROVIDER_SITE_OTHER): Payer: Medicare Other | Admitting: Cardiology

## 2017-04-27 DIAGNOSIS — Z7901 Long term (current) use of anticoagulants: Secondary | ICD-10-CM

## 2017-04-27 DIAGNOSIS — Z5181 Encounter for therapeutic drug level monitoring: Secondary | ICD-10-CM

## 2017-04-27 LAB — POCT INR: INR: 2

## 2017-05-05 ENCOUNTER — Ambulatory Visit (INDEPENDENT_AMBULATORY_CARE_PROVIDER_SITE_OTHER): Payer: Medicare Other | Admitting: Internal Medicine

## 2017-05-05 DIAGNOSIS — Z7901 Long term (current) use of anticoagulants: Secondary | ICD-10-CM

## 2017-05-05 DIAGNOSIS — Z5181 Encounter for therapeutic drug level monitoring: Secondary | ICD-10-CM

## 2017-05-05 LAB — POCT INR: INR: 2.5

## 2017-05-12 ENCOUNTER — Ambulatory Visit (INDEPENDENT_AMBULATORY_CARE_PROVIDER_SITE_OTHER): Payer: Medicare Other | Admitting: Internal Medicine

## 2017-05-12 DIAGNOSIS — Z7901 Long term (current) use of anticoagulants: Secondary | ICD-10-CM

## 2017-05-12 DIAGNOSIS — Z5181 Encounter for therapeutic drug level monitoring: Secondary | ICD-10-CM

## 2017-05-12 LAB — POCT INR: INR: 2.6

## 2017-05-16 ENCOUNTER — Other Ambulatory Visit: Payer: Self-pay | Admitting: Internal Medicine

## 2017-05-20 LAB — POCT INR: INR: 2.3

## 2017-05-23 ENCOUNTER — Ambulatory Visit (INDEPENDENT_AMBULATORY_CARE_PROVIDER_SITE_OTHER): Payer: Medicare Other | Admitting: Cardiology

## 2017-05-23 DIAGNOSIS — Z7901 Long term (current) use of anticoagulants: Secondary | ICD-10-CM

## 2017-05-23 DIAGNOSIS — Z5181 Encounter for therapeutic drug level monitoring: Secondary | ICD-10-CM

## 2017-05-27 ENCOUNTER — Ambulatory Visit (INDEPENDENT_AMBULATORY_CARE_PROVIDER_SITE_OTHER): Payer: Medicare Other | Admitting: Internal Medicine

## 2017-05-27 DIAGNOSIS — Z5181 Encounter for therapeutic drug level monitoring: Secondary | ICD-10-CM

## 2017-05-27 DIAGNOSIS — Z7901 Long term (current) use of anticoagulants: Secondary | ICD-10-CM

## 2017-05-27 LAB — POCT INR: INR: 2.7

## 2017-06-03 ENCOUNTER — Ambulatory Visit (INDEPENDENT_AMBULATORY_CARE_PROVIDER_SITE_OTHER): Payer: Medicare Other | Admitting: Internal Medicine

## 2017-06-03 DIAGNOSIS — Z5181 Encounter for therapeutic drug level monitoring: Secondary | ICD-10-CM

## 2017-06-03 DIAGNOSIS — Z7901 Long term (current) use of anticoagulants: Secondary | ICD-10-CM

## 2017-06-03 LAB — POCT INR: INR: 2

## 2017-06-10 ENCOUNTER — Ambulatory Visit (INDEPENDENT_AMBULATORY_CARE_PROVIDER_SITE_OTHER): Payer: Medicare Other | Admitting: Cardiovascular Disease

## 2017-06-10 DIAGNOSIS — Z5181 Encounter for therapeutic drug level monitoring: Secondary | ICD-10-CM

## 2017-06-10 DIAGNOSIS — Z7901 Long term (current) use of anticoagulants: Secondary | ICD-10-CM

## 2017-06-10 LAB — POCT INR: INR: 2.8

## 2017-06-17 LAB — PROTIME-INR: INR: 2.9 — AB (ref 0.9–1.1)

## 2017-06-17 LAB — POCT INR: INR: 2.9

## 2017-06-23 ENCOUNTER — Ambulatory Visit (INDEPENDENT_AMBULATORY_CARE_PROVIDER_SITE_OTHER): Payer: Medicare Other | Admitting: Pharmacist

## 2017-06-23 DIAGNOSIS — Z7901 Long term (current) use of anticoagulants: Secondary | ICD-10-CM

## 2017-06-23 DIAGNOSIS — Z5181 Encounter for therapeutic drug level monitoring: Secondary | ICD-10-CM

## 2017-06-24 ENCOUNTER — Ambulatory Visit (INDEPENDENT_AMBULATORY_CARE_PROVIDER_SITE_OTHER): Payer: Medicare Other | Admitting: Pharmacist

## 2017-06-24 DIAGNOSIS — Z7901 Long term (current) use of anticoagulants: Secondary | ICD-10-CM

## 2017-06-24 DIAGNOSIS — Z5181 Encounter for therapeutic drug level monitoring: Secondary | ICD-10-CM

## 2017-06-24 LAB — POCT INR: INR: 2.9

## 2017-07-01 ENCOUNTER — Encounter: Payer: Self-pay | Admitting: Pharmacist

## 2017-07-01 ENCOUNTER — Ambulatory Visit (INDEPENDENT_AMBULATORY_CARE_PROVIDER_SITE_OTHER): Payer: Self-pay | Admitting: Interventional Cardiology

## 2017-07-01 DIAGNOSIS — Z7901 Long term (current) use of anticoagulants: Secondary | ICD-10-CM

## 2017-07-01 DIAGNOSIS — Z5181 Encounter for therapeutic drug level monitoring: Secondary | ICD-10-CM

## 2017-07-01 LAB — POCT INR: INR: 2

## 2017-07-08 ENCOUNTER — Ambulatory Visit (INDEPENDENT_AMBULATORY_CARE_PROVIDER_SITE_OTHER): Payer: Medicare Other | Admitting: Cardiology

## 2017-07-08 DIAGNOSIS — Z7901 Long term (current) use of anticoagulants: Secondary | ICD-10-CM

## 2017-07-08 DIAGNOSIS — Z5181 Encounter for therapeutic drug level monitoring: Secondary | ICD-10-CM

## 2017-07-08 LAB — POCT INR: INR: 2.9

## 2017-07-15 ENCOUNTER — Ambulatory Visit (INDEPENDENT_AMBULATORY_CARE_PROVIDER_SITE_OTHER): Payer: Medicare Other | Admitting: Internal Medicine

## 2017-07-15 DIAGNOSIS — I480 Paroxysmal atrial fibrillation: Secondary | ICD-10-CM | POA: Diagnosis not present

## 2017-07-15 DIAGNOSIS — Z7901 Long term (current) use of anticoagulants: Secondary | ICD-10-CM

## 2017-07-15 DIAGNOSIS — Z5181 Encounter for therapeutic drug level monitoring: Secondary | ICD-10-CM

## 2017-07-15 LAB — POCT INR: INR: 2.9

## 2017-07-22 ENCOUNTER — Ambulatory Visit (INDEPENDENT_AMBULATORY_CARE_PROVIDER_SITE_OTHER): Payer: Medicare Other | Admitting: Pharmacist

## 2017-07-22 DIAGNOSIS — Z7901 Long term (current) use of anticoagulants: Secondary | ICD-10-CM | POA: Diagnosis not present

## 2017-07-22 DIAGNOSIS — Z5181 Encounter for therapeutic drug level monitoring: Secondary | ICD-10-CM

## 2017-07-22 LAB — POCT INR: INR: 2.9

## 2017-08-01 ENCOUNTER — Ambulatory Visit (INDEPENDENT_AMBULATORY_CARE_PROVIDER_SITE_OTHER): Payer: Medicare Other | Admitting: Internal Medicine

## 2017-08-01 DIAGNOSIS — I480 Paroxysmal atrial fibrillation: Secondary | ICD-10-CM

## 2017-08-01 DIAGNOSIS — Z7901 Long term (current) use of anticoagulants: Secondary | ICD-10-CM

## 2017-08-01 DIAGNOSIS — Z5181 Encounter for therapeutic drug level monitoring: Secondary | ICD-10-CM | POA: Diagnosis not present

## 2017-08-01 LAB — POCT INR: INR: 2.9

## 2017-08-08 ENCOUNTER — Ambulatory Visit (INDEPENDENT_AMBULATORY_CARE_PROVIDER_SITE_OTHER): Payer: Medicare Other | Admitting: Cardiovascular Disease

## 2017-08-08 DIAGNOSIS — Z7901 Long term (current) use of anticoagulants: Secondary | ICD-10-CM

## 2017-08-08 DIAGNOSIS — I4891 Unspecified atrial fibrillation: Secondary | ICD-10-CM | POA: Diagnosis not present

## 2017-08-08 DIAGNOSIS — Z5181 Encounter for therapeutic drug level monitoring: Secondary | ICD-10-CM

## 2017-08-08 LAB — POCT INR: INR: 2.9

## 2017-08-10 IMAGING — DX DG CHEST 1V PORT
1 series · 1 of 1 positions shown · non-contrast
Comparison: 11/22/2016

CLINICAL DATA: Chest pain and heaviness.

EXAM:
PORTABLE CHEST 1 VIEW

[chest ap]
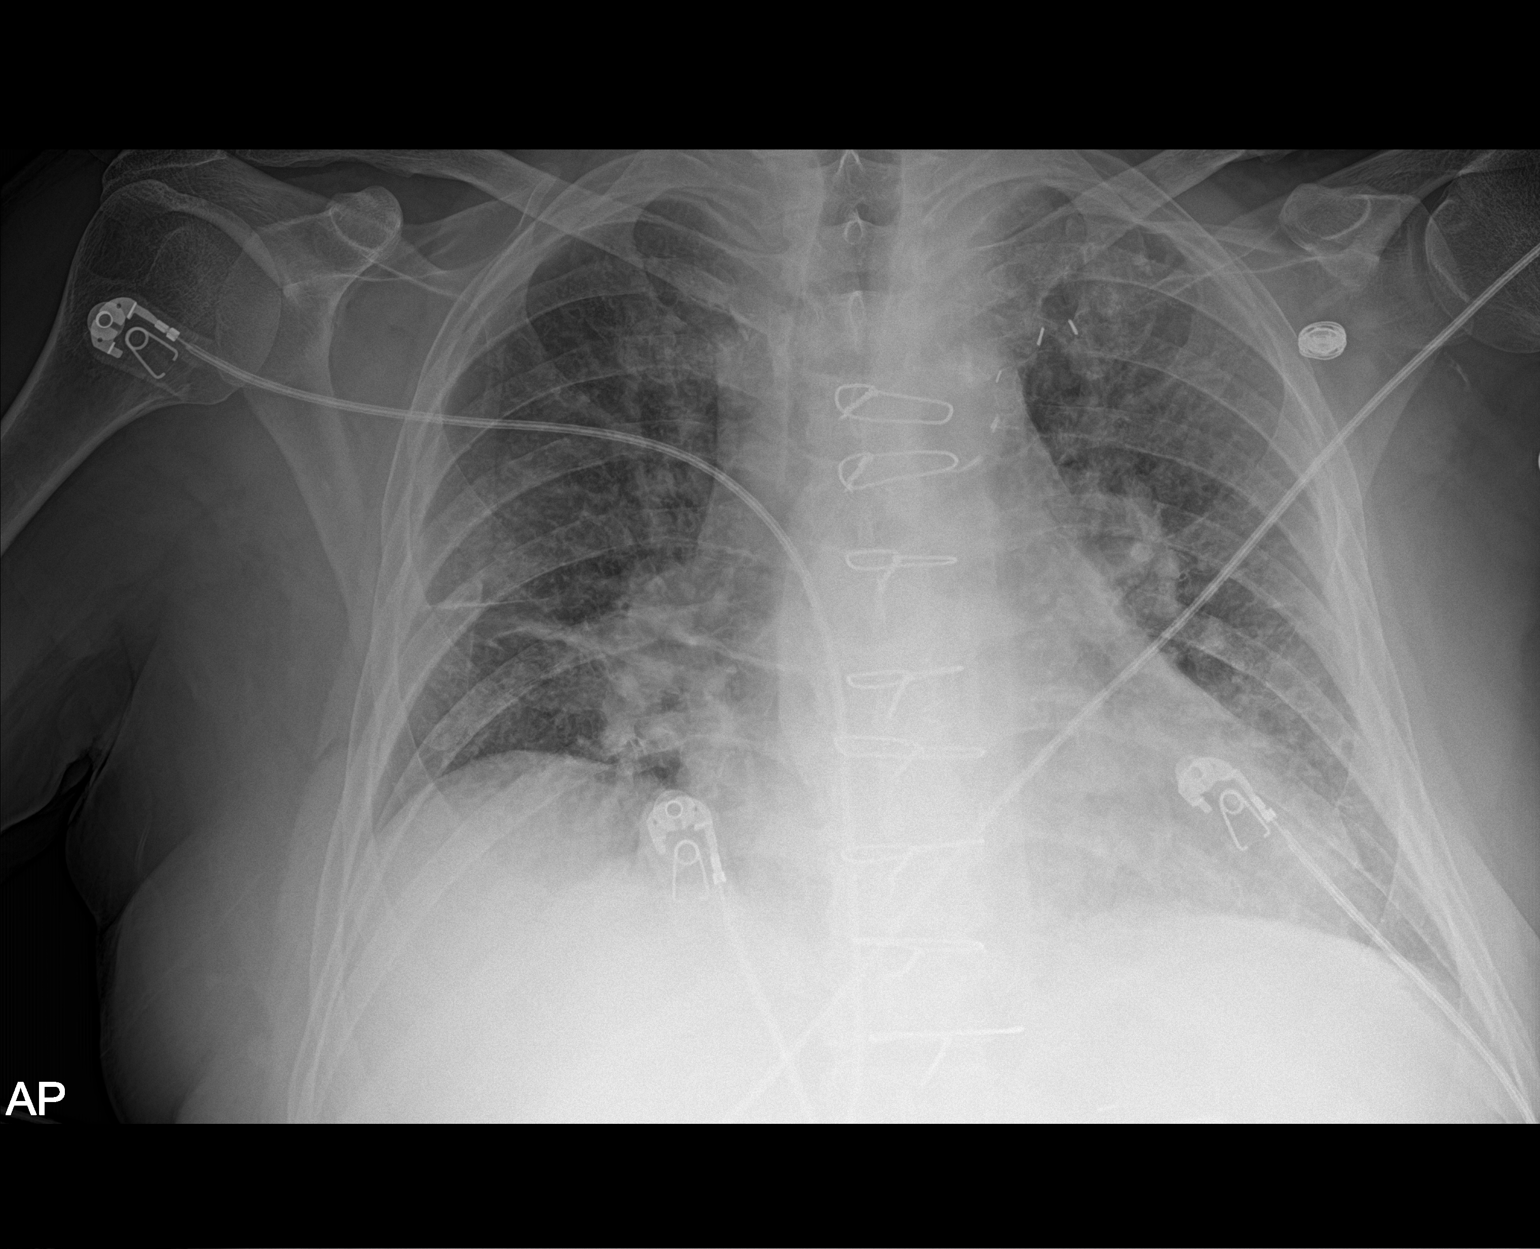

[1 of 1 positions shown; findings below may reference images not displayed]

FINDINGS: Bilateral diffuse interstitial thickening. No pleural effusion or
pneumothorax. No focal consolidation. Stable cardiomegaly. Prior
CABG. No acute osseous abnormality.
IMPRESSION: Cardiomegaly with mild pulmonary vascular congestion.

## 2017-08-15 LAB — POCT INR: INR: 2.8

## 2017-08-16 ENCOUNTER — Ambulatory Visit (INDEPENDENT_AMBULATORY_CARE_PROVIDER_SITE_OTHER): Payer: Medicare Other | Admitting: Interventional Cardiology

## 2017-08-16 DIAGNOSIS — Z5181 Encounter for therapeutic drug level monitoring: Secondary | ICD-10-CM | POA: Diagnosis not present

## 2017-08-16 DIAGNOSIS — Z7901 Long term (current) use of anticoagulants: Secondary | ICD-10-CM

## 2017-08-23 ENCOUNTER — Ambulatory Visit (INDEPENDENT_AMBULATORY_CARE_PROVIDER_SITE_OTHER): Payer: Medicare Other | Admitting: *Deleted

## 2017-08-23 DIAGNOSIS — I4891 Unspecified atrial fibrillation: Secondary | ICD-10-CM

## 2017-08-23 DIAGNOSIS — Z5181 Encounter for therapeutic drug level monitoring: Secondary | ICD-10-CM

## 2017-08-23 DIAGNOSIS — Z7901 Long term (current) use of anticoagulants: Secondary | ICD-10-CM

## 2017-08-23 LAB — POCT INR: INR: 2.7

## 2017-08-29 LAB — POCT INR: INR: 2.1

## 2017-08-30 ENCOUNTER — Ambulatory Visit (INDEPENDENT_AMBULATORY_CARE_PROVIDER_SITE_OTHER): Payer: Medicare Other | Admitting: Cardiovascular Disease

## 2017-08-30 DIAGNOSIS — Z5181 Encounter for therapeutic drug level monitoring: Secondary | ICD-10-CM

## 2017-08-30 DIAGNOSIS — I4891 Unspecified atrial fibrillation: Secondary | ICD-10-CM | POA: Diagnosis not present

## 2017-08-30 DIAGNOSIS — Z7901 Long term (current) use of anticoagulants: Secondary | ICD-10-CM

## 2017-09-05 ENCOUNTER — Ambulatory Visit (INDEPENDENT_AMBULATORY_CARE_PROVIDER_SITE_OTHER): Payer: Medicare Other | Admitting: Internal Medicine

## 2017-09-05 DIAGNOSIS — Z5181 Encounter for therapeutic drug level monitoring: Secondary | ICD-10-CM

## 2017-09-05 DIAGNOSIS — Z7901 Long term (current) use of anticoagulants: Secondary | ICD-10-CM

## 2017-09-05 LAB — POCT INR: INR: 2

## 2017-09-12 LAB — POCT INR: INR: 2.4

## 2017-09-13 ENCOUNTER — Ambulatory Visit (INDEPENDENT_AMBULATORY_CARE_PROVIDER_SITE_OTHER): Payer: Medicare Other | Admitting: Cardiology

## 2017-09-13 DIAGNOSIS — Z7901 Long term (current) use of anticoagulants: Secondary | ICD-10-CM

## 2017-09-13 DIAGNOSIS — Z5181 Encounter for therapeutic drug level monitoring: Secondary | ICD-10-CM | POA: Diagnosis not present

## 2017-09-13 DIAGNOSIS — I4891 Unspecified atrial fibrillation: Secondary | ICD-10-CM

## 2017-09-14 ENCOUNTER — Telehealth: Payer: Self-pay | Admitting: *Deleted

## 2017-09-14 NOTE — Telephone Encounter (Addendum)
Voicemail left on machine by the pt stating his LOT number on his test strips. LOT number was [id number], according to [employer] these strips are recalled & should not be used.  Spoke with pt & he is aware that his strips are on the recall list & that he will need to schedule an appt. Pt unwilling to have an appt scheduled at this time & states she will call back to notify us if his new test strips will arrive soon.

## 2017-09-15 ENCOUNTER — Telehealth: Payer: Self-pay | Admitting: *Deleted

## 2017-09-15 NOTE — Telephone Encounter (Signed)
Pt called and he has another box of strips lot # which is [id number] and since they are on the recall list, appt was schedule here in the office on Monday. Pt encouraged to call [employer] to ask for new strips to be mailed ASAP.

## 2017-09-19 ENCOUNTER — Ambulatory Visit (INDEPENDENT_AMBULATORY_CARE_PROVIDER_SITE_OTHER): Payer: Medicare Other | Admitting: *Deleted

## 2017-09-19 DIAGNOSIS — Z7901 Long term (current) use of anticoagulants: Secondary | ICD-10-CM | POA: Diagnosis not present

## 2017-09-19 DIAGNOSIS — Z5181 Encounter for therapeutic drug level monitoring: Secondary | ICD-10-CM | POA: Diagnosis not present

## 2017-09-19 DIAGNOSIS — I4891 Unspecified atrial fibrillation: Secondary | ICD-10-CM

## 2017-09-19 LAB — POCT INR: INR: 2.8

## 2017-09-19 NOTE — Progress Notes (Signed)
09/19/17-INR-2.8; Continue taking 1 tablet every day except 1 and 1/2  tablets on Mondays. Recheck INR in 2 weeks (pt is a self tester-strips recalled).

## 2017-10-03 ENCOUNTER — Ambulatory Visit (INDEPENDENT_AMBULATORY_CARE_PROVIDER_SITE_OTHER): Payer: Medicare Other | Admitting: Internal Medicine

## 2017-10-03 ENCOUNTER — Telehealth: Payer: Self-pay | Admitting: Pharmacist

## 2017-10-03 DIAGNOSIS — I4891 Unspecified atrial fibrillation: Secondary | ICD-10-CM | POA: Diagnosis not present

## 2017-10-03 DIAGNOSIS — Z5181 Encounter for therapeutic drug level monitoring: Secondary | ICD-10-CM

## 2017-10-03 DIAGNOSIS — Z7901 Long term (current) use of anticoagulants: Secondary | ICD-10-CM

## 2017-10-03 LAB — POCT INR: INR: 2.5

## 2017-10-03 NOTE — Telephone Encounter (Signed)
Pt called to report that received new test strips to replaced recalled strips. Advised he call back today with results as he is due to test today. He states understanding and appreciation.

## 2017-10-10 LAB — POCT INR: INR: 2.9

## 2017-10-11 ENCOUNTER — Ambulatory Visit (INDEPENDENT_AMBULATORY_CARE_PROVIDER_SITE_OTHER): Payer: Medicare Other | Admitting: Interventional Cardiology

## 2017-10-11 DIAGNOSIS — Z7901 Long term (current) use of anticoagulants: Secondary | ICD-10-CM

## 2017-10-11 DIAGNOSIS — Z5181 Encounter for therapeutic drug level monitoring: Secondary | ICD-10-CM | POA: Diagnosis not present

## 2017-10-11 DIAGNOSIS — I480 Paroxysmal atrial fibrillation: Secondary | ICD-10-CM

## 2017-10-21 ENCOUNTER — Ambulatory Visit (INDEPENDENT_AMBULATORY_CARE_PROVIDER_SITE_OTHER): Payer: Medicare Other | Admitting: Cardiology

## 2017-10-21 DIAGNOSIS — Z5181 Encounter for therapeutic drug level monitoring: Secondary | ICD-10-CM

## 2017-10-21 DIAGNOSIS — Z7901 Long term (current) use of anticoagulants: Secondary | ICD-10-CM | POA: Diagnosis not present

## 2017-10-21 LAB — POCT INR: INR: 2.3

## 2017-11-02 LAB — POCT INR: INR: 2.3

## 2017-11-03 ENCOUNTER — Ambulatory Visit (INDEPENDENT_AMBULATORY_CARE_PROVIDER_SITE_OTHER): Payer: Medicare Other | Admitting: Internal Medicine

## 2017-11-03 ENCOUNTER — Other Ambulatory Visit: Payer: Self-pay | Admitting: Internal Medicine

## 2017-11-03 DIAGNOSIS — Z5181 Encounter for therapeutic drug level monitoring: Secondary | ICD-10-CM | POA: Diagnosis not present

## 2017-11-03 DIAGNOSIS — Z7901 Long term (current) use of anticoagulants: Secondary | ICD-10-CM | POA: Diagnosis not present

## 2017-11-09 LAB — POCT INR: INR: 2.1

## 2017-11-10 ENCOUNTER — Ambulatory Visit (INDEPENDENT_AMBULATORY_CARE_PROVIDER_SITE_OTHER): Payer: Medicare Other

## 2017-11-10 DIAGNOSIS — I480 Paroxysmal atrial fibrillation: Secondary | ICD-10-CM | POA: Diagnosis not present

## 2017-11-10 DIAGNOSIS — Z5181 Encounter for therapeutic drug level monitoring: Secondary | ICD-10-CM | POA: Diagnosis not present

## 2017-11-10 DIAGNOSIS — Z7901 Long term (current) use of anticoagulants: Secondary | ICD-10-CM

## 2017-11-10 NOTE — Patient Instructions (Signed)
Description   Spoke with pt, instructed to continue taking 1 tablet every day except 1.5  tablets on Mondays. Recheck INR in 1 week for insurance purposes. (pt is a Personal assistant Call if you get any medication changes # [phone]

## 2017-11-17 ENCOUNTER — Ambulatory Visit (INDEPENDENT_AMBULATORY_CARE_PROVIDER_SITE_OTHER): Payer: Medicare Other | Admitting: Cardiovascular Disease

## 2017-11-17 DIAGNOSIS — I4891 Unspecified atrial fibrillation: Secondary | ICD-10-CM

## 2017-11-17 DIAGNOSIS — Z5181 Encounter for therapeutic drug level monitoring: Secondary | ICD-10-CM | POA: Diagnosis not present

## 2017-11-17 DIAGNOSIS — Z7901 Long term (current) use of anticoagulants: Secondary | ICD-10-CM

## 2017-11-17 LAB — POCT INR: INR: 2.5

## 2017-11-29 LAB — POCT INR: INR: 2.7

## 2017-11-30 ENCOUNTER — Ambulatory Visit (INDEPENDENT_AMBULATORY_CARE_PROVIDER_SITE_OTHER): Payer: Medicare Other | Admitting: Cardiology

## 2017-11-30 DIAGNOSIS — Z7901 Long term (current) use of anticoagulants: Secondary | ICD-10-CM | POA: Diagnosis not present

## 2017-11-30 DIAGNOSIS — Z5181 Encounter for therapeutic drug level monitoring: Secondary | ICD-10-CM

## 2017-11-30 NOTE — Patient Instructions (Signed)
Description   Spoke with pt, instructed to continue taking 1 tablet every day except 1.5  tablets on Mondays. Recheck INR in 1 week for insurance purposes. (pt is a Personal assistant Call if you get any medication changes # [phone]

## 2017-12-07 LAB — POCT INR: INR: 2.9

## 2017-12-08 ENCOUNTER — Ambulatory Visit (INDEPENDENT_AMBULATORY_CARE_PROVIDER_SITE_OTHER): Payer: Medicare Other | Admitting: Cardiovascular Disease

## 2017-12-08 DIAGNOSIS — Z5181 Encounter for therapeutic drug level monitoring: Secondary | ICD-10-CM

## 2017-12-08 DIAGNOSIS — I4891 Unspecified atrial fibrillation: Secondary | ICD-10-CM | POA: Diagnosis not present

## 2017-12-08 DIAGNOSIS — Z7901 Long term (current) use of anticoagulants: Secondary | ICD-10-CM | POA: Diagnosis not present

## 2017-12-16 ENCOUNTER — Ambulatory Visit (INDEPENDENT_AMBULATORY_CARE_PROVIDER_SITE_OTHER): Payer: Medicare Other | Admitting: Internal Medicine

## 2017-12-16 DIAGNOSIS — Z5181 Encounter for therapeutic drug level monitoring: Secondary | ICD-10-CM | POA: Diagnosis not present

## 2017-12-16 DIAGNOSIS — I4891 Unspecified atrial fibrillation: Secondary | ICD-10-CM

## 2017-12-16 DIAGNOSIS — Z7901 Long term (current) use of anticoagulants: Secondary | ICD-10-CM

## 2017-12-16 LAB — POCT INR: INR: 2.5

## 2017-12-16 NOTE — Patient Instructions (Signed)
Description   Spoke with pt, instructed to continue taking 1 tablet every day except 1.5  tablets on Mondays. Recheck INR in 1 week for insurance purposes. (pt is a Personal assistant Call if you get any medication changes # [phone]

## 2017-12-23 ENCOUNTER — Ambulatory Visit (INDEPENDENT_AMBULATORY_CARE_PROVIDER_SITE_OTHER): Payer: Medicare Other | Admitting: Internal Medicine

## 2017-12-23 DIAGNOSIS — Z7901 Long term (current) use of anticoagulants: Secondary | ICD-10-CM

## 2017-12-23 DIAGNOSIS — Z5181 Encounter for therapeutic drug level monitoring: Secondary | ICD-10-CM

## 2017-12-23 LAB — POCT INR: INR: 2.6

## 2017-12-23 NOTE — Patient Instructions (Signed)
Description   Spoke with pt, instructed to continue taking 1 tablet every day except 1.5  tablets on Mondays. Recheck INR in 1 week for insurance purposes. (pt is a Personal assistant Call if you get any medication changes # [phone]

## 2018-01-02 ENCOUNTER — Ambulatory Visit (INDEPENDENT_AMBULATORY_CARE_PROVIDER_SITE_OTHER): Payer: Medicare Other | Admitting: Cardiovascular Disease

## 2018-01-02 DIAGNOSIS — Z7901 Long term (current) use of anticoagulants: Secondary | ICD-10-CM | POA: Diagnosis not present

## 2018-01-02 DIAGNOSIS — Z5181 Encounter for therapeutic drug level monitoring: Secondary | ICD-10-CM

## 2018-01-02 LAB — POCT INR: INR: 2.1

## 2018-01-06 ENCOUNTER — Ambulatory Visit (INDEPENDENT_AMBULATORY_CARE_PROVIDER_SITE_OTHER): Payer: Medicare Other | Admitting: Pharmacist

## 2018-01-06 DIAGNOSIS — Z7901 Long term (current) use of anticoagulants: Secondary | ICD-10-CM

## 2018-01-06 DIAGNOSIS — Z5181 Encounter for therapeutic drug level monitoring: Secondary | ICD-10-CM

## 2018-01-06 LAB — POCT INR: INR: 2.9

## 2018-01-10 ENCOUNTER — Ambulatory Visit (INDEPENDENT_AMBULATORY_CARE_PROVIDER_SITE_OTHER): Payer: Medicare Other | Admitting: Interventional Cardiology

## 2018-01-10 DIAGNOSIS — Z5181 Encounter for therapeutic drug level monitoring: Secondary | ICD-10-CM

## 2018-01-10 DIAGNOSIS — Z7901 Long term (current) use of anticoagulants: Secondary | ICD-10-CM

## 2018-01-10 LAB — POCT INR: INR: 2.9

## 2018-01-17 ENCOUNTER — Other Ambulatory Visit: Payer: Self-pay | Admitting: Internal Medicine

## 2018-01-17 LAB — POCT INR: INR: 2.9

## 2018-01-18 ENCOUNTER — Other Ambulatory Visit: Payer: Self-pay | Admitting: *Deleted

## 2018-01-18 ENCOUNTER — Ambulatory Visit (INDEPENDENT_AMBULATORY_CARE_PROVIDER_SITE_OTHER): Payer: Medicare Other | Admitting: Cardiovascular Disease

## 2018-01-18 DIAGNOSIS — Z5181 Encounter for therapeutic drug level monitoring: Secondary | ICD-10-CM

## 2018-01-18 DIAGNOSIS — Z7901 Long term (current) use of anticoagulants: Secondary | ICD-10-CM | POA: Diagnosis not present

## 2018-01-18 NOTE — Patient Instructions (Signed)
Description   Spoke with pt, instructed to continue taking 1 tablet every day except 1.5 tablets on Mondays. Recheck INR in 1 week for insurance purposes. (pt is a self tester) # [phone]     

## 2018-01-25 ENCOUNTER — Ambulatory Visit (INDEPENDENT_AMBULATORY_CARE_PROVIDER_SITE_OTHER): Payer: Medicare Other | Admitting: Internal Medicine

## 2018-01-25 DIAGNOSIS — Z7901 Long term (current) use of anticoagulants: Secondary | ICD-10-CM

## 2018-01-25 DIAGNOSIS — Z5181 Encounter for therapeutic drug level monitoring: Secondary | ICD-10-CM

## 2018-01-25 DIAGNOSIS — I4891 Unspecified atrial fibrillation: Secondary | ICD-10-CM | POA: Diagnosis not present

## 2018-01-25 LAB — POCT INR: INR: 2.9

## 2018-01-25 NOTE — Patient Instructions (Signed)
Description   Spoke with pt, instructed to continue taking 1 tablet every day except 1.5 tablets on Mondays. Recheck INR in 1 week for insurance purposes. (pt is a self tester) # [phone]     

## 2018-02-02 ENCOUNTER — Ambulatory Visit (INDEPENDENT_AMBULATORY_CARE_PROVIDER_SITE_OTHER): Payer: Medicare Other | Admitting: Internal Medicine

## 2018-02-02 DIAGNOSIS — Z7901 Long term (current) use of anticoagulants: Secondary | ICD-10-CM | POA: Diagnosis not present

## 2018-02-02 DIAGNOSIS — Z5181 Encounter for therapeutic drug level monitoring: Secondary | ICD-10-CM | POA: Diagnosis not present

## 2018-02-02 LAB — POCT INR: INR: 2.1

## 2018-02-02 NOTE — Patient Instructions (Signed)
Description   Spoke with pt, instructed to continue taking 1 tablet every day except 1.5 tablets on Mondays. Recheck INR in 1 week for insurance purposes. (pt is a self tester) # [phone]     

## 2018-02-09 LAB — POCT INR: INR: 2.4

## 2018-02-10 ENCOUNTER — Ambulatory Visit (INDEPENDENT_AMBULATORY_CARE_PROVIDER_SITE_OTHER): Payer: Medicare Other | Admitting: Cardiology

## 2018-02-10 DIAGNOSIS — Z5181 Encounter for therapeutic drug level monitoring: Secondary | ICD-10-CM | POA: Diagnosis not present

## 2018-02-10 DIAGNOSIS — Z7901 Long term (current) use of anticoagulants: Secondary | ICD-10-CM

## 2018-02-17 LAB — POCT INR: INR: 2.3

## 2018-02-20 ENCOUNTER — Ambulatory Visit (INDEPENDENT_AMBULATORY_CARE_PROVIDER_SITE_OTHER): Payer: Medicare Other | Admitting: Internal Medicine

## 2018-02-20 DIAGNOSIS — Z7901 Long term (current) use of anticoagulants: Secondary | ICD-10-CM

## 2018-02-20 DIAGNOSIS — Z5181 Encounter for therapeutic drug level monitoring: Secondary | ICD-10-CM | POA: Diagnosis not present

## 2018-02-20 NOTE — Patient Instructions (Signed)
Description   Spoke with pt, instructed to continue taking 1 tablet every day except 1.5 tablets on Mondays. Recheck INR in 1 week for insurance purposes. (pt is a self tester) # [phone]     

## 2018-02-25 DIAGNOSIS — K859 Acute pancreatitis without necrosis or infection, unspecified: Secondary | ICD-10-CM | POA: Diagnosis not present

## 2018-02-25 DIAGNOSIS — I130 Hypertensive heart and chronic kidney disease with heart failure and stage 1 through stage 4 chronic kidney disease, or unspecified chronic kidney disease: Secondary | ICD-10-CM

## 2018-02-25 DIAGNOSIS — I5023 Acute on chronic systolic (congestive) heart failure: Secondary | ICD-10-CM

## 2018-02-25 DIAGNOSIS — D693 Immune thrombocytopenic purpura: Secondary | ICD-10-CM | POA: Diagnosis not present

## 2018-02-25 DIAGNOSIS — I472 Ventricular tachycardia: Secondary | ICD-10-CM

## 2018-02-26 DIAGNOSIS — I5023 Acute on chronic systolic (congestive) heart failure: Secondary | ICD-10-CM | POA: Diagnosis not present

## 2018-02-26 DIAGNOSIS — I472 Ventricular tachycardia: Secondary | ICD-10-CM | POA: Diagnosis not present

## 2018-02-26 DIAGNOSIS — D693 Immune thrombocytopenic purpura: Secondary | ICD-10-CM | POA: Diagnosis not present

## 2018-02-26 DIAGNOSIS — K859 Acute pancreatitis without necrosis or infection, unspecified: Secondary | ICD-10-CM | POA: Diagnosis not present

## 2018-02-26 DIAGNOSIS — I130 Hypertensive heart and chronic kidney disease with heart failure and stage 1 through stage 4 chronic kidney disease, or unspecified chronic kidney disease: Secondary | ICD-10-CM | POA: Diagnosis not present

## 2018-02-27 DIAGNOSIS — I5023 Acute on chronic systolic (congestive) heart failure: Secondary | ICD-10-CM | POA: Diagnosis not present

## 2018-02-27 DIAGNOSIS — I130 Hypertensive heart and chronic kidney disease with heart failure and stage 1 through stage 4 chronic kidney disease, or unspecified chronic kidney disease: Secondary | ICD-10-CM | POA: Diagnosis not present

## 2018-02-27 DIAGNOSIS — D693 Immune thrombocytopenic purpura: Secondary | ICD-10-CM | POA: Diagnosis not present

## 2018-02-27 DIAGNOSIS — K859 Acute pancreatitis without necrosis or infection, unspecified: Secondary | ICD-10-CM | POA: Diagnosis not present

## 2018-02-27 DIAGNOSIS — I472 Ventricular tachycardia: Secondary | ICD-10-CM | POA: Diagnosis not present

## 2018-02-28 DIAGNOSIS — I130 Hypertensive heart and chronic kidney disease with heart failure and stage 1 through stage 4 chronic kidney disease, or unspecified chronic kidney disease: Secondary | ICD-10-CM | POA: Diagnosis not present

## 2018-02-28 DIAGNOSIS — I5023 Acute on chronic systolic (congestive) heart failure: Secondary | ICD-10-CM | POA: Diagnosis not present

## 2018-02-28 DIAGNOSIS — K859 Acute pancreatitis without necrosis or infection, unspecified: Secondary | ICD-10-CM | POA: Diagnosis not present

## 2018-02-28 DIAGNOSIS — I472 Ventricular tachycardia: Secondary | ICD-10-CM

## 2018-02-28 DIAGNOSIS — D693 Immune thrombocytopenic purpura: Secondary | ICD-10-CM | POA: Diagnosis not present

## 2018-03-01 ENCOUNTER — Telehealth: Payer: Self-pay | Admitting: *Deleted

## 2018-03-01 DIAGNOSIS — I472 Ventricular tachycardia: Secondary | ICD-10-CM | POA: Diagnosis not present

## 2018-03-01 DIAGNOSIS — D693 Immune thrombocytopenic purpura: Secondary | ICD-10-CM | POA: Diagnosis not present

## 2018-03-01 DIAGNOSIS — I130 Hypertensive heart and chronic kidney disease with heart failure and stage 1 through stage 4 chronic kidney disease, or unspecified chronic kidney disease: Secondary | ICD-10-CM | POA: Diagnosis not present

## 2018-03-01 DIAGNOSIS — K859 Acute pancreatitis without necrosis or infection, unspecified: Secondary | ICD-10-CM | POA: Diagnosis not present

## 2018-03-01 DIAGNOSIS — I5023 Acute on chronic systolic (congestive) heart failure: Secondary | ICD-10-CM | POA: Diagnosis not present

## 2018-03-01 NOTE — Telephone Encounter (Addendum)
Pt calls back as he states we have attempted to call him. His INR was due on 02/27/18 but he states he was admitted to [facility] on 02/25/18. He states his mom has pasted. He will call us when he is discharged.

## 2018-03-02 DIAGNOSIS — D693 Immune thrombocytopenic purpura: Secondary | ICD-10-CM | POA: Diagnosis not present

## 2018-03-02 DIAGNOSIS — I130 Hypertensive heart and chronic kidney disease with heart failure and stage 1 through stage 4 chronic kidney disease, or unspecified chronic kidney disease: Secondary | ICD-10-CM | POA: Diagnosis not present

## 2018-03-02 DIAGNOSIS — I472 Ventricular tachycardia: Secondary | ICD-10-CM | POA: Diagnosis not present

## 2018-03-02 DIAGNOSIS — K859 Acute pancreatitis without necrosis or infection, unspecified: Secondary | ICD-10-CM | POA: Diagnosis not present

## 2018-03-02 DIAGNOSIS — I5023 Acute on chronic systolic (congestive) heart failure: Secondary | ICD-10-CM | POA: Diagnosis not present

## 2018-03-03 DIAGNOSIS — D693 Immune thrombocytopenic purpura: Secondary | ICD-10-CM | POA: Diagnosis not present

## 2018-03-03 DIAGNOSIS — I130 Hypertensive heart and chronic kidney disease with heart failure and stage 1 through stage 4 chronic kidney disease, or unspecified chronic kidney disease: Secondary | ICD-10-CM | POA: Diagnosis not present

## 2018-03-03 DIAGNOSIS — I472 Ventricular tachycardia: Secondary | ICD-10-CM | POA: Diagnosis not present

## 2018-03-03 DIAGNOSIS — I5023 Acute on chronic systolic (congestive) heart failure: Secondary | ICD-10-CM | POA: Diagnosis not present

## 2018-03-03 DIAGNOSIS — K859 Acute pancreatitis without necrosis or infection, unspecified: Secondary | ICD-10-CM | POA: Diagnosis not present

## 2018-03-20 ENCOUNTER — Ambulatory Visit (INDEPENDENT_AMBULATORY_CARE_PROVIDER_SITE_OTHER): Payer: Medicare Other | Admitting: Internal Medicine

## 2018-03-20 ENCOUNTER — Encounter: Payer: Self-pay | Admitting: Internal Medicine

## 2018-03-20 VITALS — BP 130/60 | HR 98 | Ht 70.0 in | Wt 197.0 lb

## 2018-03-20 DIAGNOSIS — I4891 Unspecified atrial fibrillation: Secondary | ICD-10-CM | POA: Diagnosis not present

## 2018-03-20 DIAGNOSIS — Z7901 Long term (current) use of anticoagulants: Secondary | ICD-10-CM | POA: Diagnosis not present

## 2018-03-20 NOTE — Patient Instructions (Signed)
Medication Instructions:  Your physician recommends that you continue on your current medications as directed. Please refer to the Current Medication list given to you today.  Labwork: None ordered.  Testing/Procedures: Your physician has requested that you have an echocardiogram. Echocardiography is a painless test that uses sound waves to create images of your heart. It provides your doctor with information about the size and shape of your heart and how well your heart's chambers and valves are working. This procedure takes approximately one hour. There are no restrictions for this procedure.  Please schedule for One Month  Follow-Up: Your physician wants you to follow-up in: 6 months with Dr [name]. You will receive a reminder letter in the mail two months in advance. If you don't receive a letter, please call our office to schedule the follow-up appointment.    Any Other Special Instructions Will Be Listed Below (If Applicable).     If you need a refill on your cardiac medications before your next appointment, please call your pharmacy.

## 2018-03-20 NOTE — Progress Notes (Signed)
Patient Care Team: [name]., MD as PCP - General (Family Medicine) Deboraha Sprang, MD as Consulting Physician (Cardiology) Jamal Maes, MD as Consulting Physician (Nephrology)   HPI  [name] is a 67 y.o. male seen in followup for permanent atrial fibrillation in the context of CAD with prior CABG and EF 41%;  he has a history of kidney and liver transplant 2008 and is followed at [facility]  When last having been seen 9/16 is completed been shortness of breath. He got better with increasing diuresis. Over recent days it has worsened again. He has some degree of volume overload manifested by edema.  He also notes a reproducible left shoulder discomfort associated with exertion particularly heavy exertion resolving rapidly with rest.     Records and Results Reviewed -[facility]  6/16  HR excursion quite broad and carvidolol doubled DATE TEST EF   1/16 Myoview  41   anterolateral and inferior scars   9/16 echo  45-50 Reduced RVSF/Mod LAE   2/18 Echo  40-45   11/18 Echo  55-65%     \ He was hospitalized at [facility] a few weeks ago for worsening and persistent shortness of breath.  He is much better but is not sure what they did.  He was also diagnosed with pancreatitis and was told that his LV function was worse.  He had an unusual rhythm could have been dangerous, presumably this was ventricular tachycardia.  Now he is considerably better -100%     Past Medical History:  Diagnosis Date  . Atrial fibrillation ([place]) 08/2009  . CAD (coronary artery disease)    s/p CABG -- 1992  . ESRD (end stage renal disease) ([place])   . HLD (hyperlipidemia)   . HTN (hypertension)   . Idiopathic thrombocytopenia ([facility])     Past Surgical History:  Procedure Laterality Date  . APPENDECTOMY    . CORONARY ARTERY BYPASS GRAFT    . FACIAL COSMETIC SURGERY    . GALLBLADDER SURGERY    . HERNIA REPAIR    . KIDNEY TRANSPLANT    . LIVER TRANSPLANT      Current  Outpatient Medications  Medication Sig Dispense Refill  . acetaminophen (TYLENOL) 500 MG tablet Take 1,000 mg by mouth every 6 (six) hours as needed for headache (pain).    [name] allopurinol (ZYLOPRIM) 100 MG tablet Take 100 mg by mouth daily.    [name] atorvastatin (LIPITOR) 10 MG tablet Take 1 tablet (10 mg total) by mouth daily. 90 tablet 3  . atorvastatin (LIPITOR) 10 MG tablet TAKE 1 TABLET ONCE DAILY. 30 tablet 0  . carvedilol (COREG) 6.25 MG tablet Take 1 tablet (6.25 mg total) by mouth 2 (two) times daily. 60 tablet 0  . Cyanocobalamin (VITAMIN B12 PO) Take 1 tablet by mouth daily.    . DULoxetine (CYMBALTA) 60 MG capsule Take 60 mg by mouth at bedtime.    [name] EPINEPHrine (EPIPEN) 0.3 mg/0.3 mL DEVI Inject 0.3 mg into the muscle once as needed (severe allergic reaction).     . ferrous sulfate 325 (65 FE) MG tablet Take 325 mg by mouth daily with breakfast.    . fish oil-omega-3 fatty acids 1000 MG capsule Take 2 g by mouth 2 (two) times daily.     . furosemide (LASIX) 40 MG tablet Take 1 tablet by mouth 2 (two) times daily.    . Melatonin 1 MG TABS Take 1 mg by mouth at bedtime.    [name]  nitroGLYCERIN (NITROSTAT) 0.4 MG SL tablet Place 0.4 mg under the tongue every 5 (five) minutes as needed for chest pain.    . predniSONE (DELTASONE) 5 MG tablet Take 1 tablet (5 mg total) by mouth daily.    . QUEtiapine (SEROQUEL) 100 MG tablet Take 100 mg by mouth at bedtime.    . tacrolimus (PROGRAF) 0.5 MG capsule Take 1 mg by mouth 2 (two) times daily.     [name] torsemide (DEMADEX) 20 MG tablet Take 20 mg by mouth daily.    . traZODone (DESYREL) 150 MG tablet Take 150 mg by mouth at bedtime.    [name] warfarin (COUMADIN) 4 MG tablet TAKE AS DIRECTED BY COUMADIN CLINIC. 40 tablet 3   No current facility-administered medications for this visit.     Allergies  Allergen Reactions  . Grapefruit Concentrate Other (See Comments)    Pt has been told not to eat grapefruit  . Haloperidol Other (See Comments)    Too sedated    . Nsaids Other (See Comments)    Cannot take due to liver transplant  . Rifampin Other (See Comments)    Caused flushing  . Triazolam Other (See Comments)    Too sedated      Review of Systems negative except from HPI and PMH  Physical Exam BP 130/60   Pulse 98   Ht 5\' 10"  (1.778 m)   Wt 197 lb (89.4 kg)   SpO2 94%   BMI 28.27 kg/m  Well developed and nourished in no acute distress HENT normal Neck supple with JVP-flat Clear Regular rate and rhythm, no murmurs or gallops Abd-soft with active BS distension  No Clubbing cyanosis edema Skin-warm and dry A & Oriented  Grossly normal sensory and motor function   ECG  afib 89 -/11/39  Assessment and  Plan  Atrial fibrillation permanent  CAD  S/p CABG  Congestive heart failure  chronic-systolic/diastolic  Status post renal transplant\\    He is much better  Euvolemic continue current meds  Will need to recheck echo    Will try and get records from [place] as to the hospitaliziation  I suggested that he follow-up with pulmonary.  We spent more than 50% of our >25 min visit in face to face counseling regarding the above

## 2018-03-23 ENCOUNTER — Ambulatory Visit (INDEPENDENT_AMBULATORY_CARE_PROVIDER_SITE_OTHER): Payer: Medicare Other | Admitting: Interventional Cardiology

## 2018-03-23 DIAGNOSIS — Z7901 Long term (current) use of anticoagulants: Secondary | ICD-10-CM | POA: Diagnosis not present

## 2018-03-23 DIAGNOSIS — Z5181 Encounter for therapeutic drug level monitoring: Secondary | ICD-10-CM

## 2018-03-23 LAB — POCT INR: INR: 2.1

## 2018-03-29 DIAGNOSIS — N189 Chronic kidney disease, unspecified: Secondary | ICD-10-CM | POA: Diagnosis not present

## 2018-03-29 DIAGNOSIS — D72819 Decreased white blood cell count, unspecified: Secondary | ICD-10-CM | POA: Diagnosis not present

## 2018-03-29 DIAGNOSIS — D693 Immune thrombocytopenic purpura: Secondary | ICD-10-CM

## 2018-03-29 DIAGNOSIS — Z79899 Other long term (current) drug therapy: Secondary | ICD-10-CM

## 2018-03-29 DIAGNOSIS — M8580 Other specified disorders of bone density and structure, unspecified site: Secondary | ICD-10-CM | POA: Diagnosis not present

## 2018-03-29 DIAGNOSIS — D472 Monoclonal gammopathy: Secondary | ICD-10-CM

## 2018-03-29 DIAGNOSIS — D649 Anemia, unspecified: Secondary | ICD-10-CM

## 2018-03-31 ENCOUNTER — Ambulatory Visit (INDEPENDENT_AMBULATORY_CARE_PROVIDER_SITE_OTHER): Payer: Medicare Other

## 2018-03-31 DIAGNOSIS — Z5181 Encounter for therapeutic drug level monitoring: Secondary | ICD-10-CM | POA: Diagnosis not present

## 2018-03-31 DIAGNOSIS — Z7901 Long term (current) use of anticoagulants: Secondary | ICD-10-CM | POA: Diagnosis not present

## 2018-03-31 LAB — POCT INR: INR: 2 (ref 2.0–3.0)

## 2018-03-31 NOTE — Patient Instructions (Signed)
Description   Spoke with pt, instructed to continue taking 1 tablet every day except 1.5 tablets on Mondays. Recheck INR in 1 week for insurance purposes. (pt is a self tester) # [phone]     

## 2018-04-10 LAB — POCT INR: INR: 2 (ref 2.0–3.0)

## 2018-04-11 ENCOUNTER — Ambulatory Visit (INDEPENDENT_AMBULATORY_CARE_PROVIDER_SITE_OTHER): Payer: Medicare Other | Admitting: Cardiology

## 2018-04-11 DIAGNOSIS — Z7901 Long term (current) use of anticoagulants: Secondary | ICD-10-CM

## 2018-04-11 DIAGNOSIS — Z5181 Encounter for therapeutic drug level monitoring: Secondary | ICD-10-CM

## 2018-04-11 DIAGNOSIS — I4891 Unspecified atrial fibrillation: Secondary | ICD-10-CM | POA: Diagnosis not present

## 2018-04-17 LAB — POCT INR: INR: 2.7 (ref 2.0–3.0)

## 2018-04-18 ENCOUNTER — Ambulatory Visit (INDEPENDENT_AMBULATORY_CARE_PROVIDER_SITE_OTHER): Payer: Medicare Other | Admitting: Internal Medicine

## 2018-04-18 DIAGNOSIS — Z7901 Long term (current) use of anticoagulants: Secondary | ICD-10-CM

## 2018-04-18 DIAGNOSIS — Z5181 Encounter for therapeutic drug level monitoring: Secondary | ICD-10-CM | POA: Diagnosis not present

## 2018-04-24 ENCOUNTER — Other Ambulatory Visit: Payer: Self-pay

## 2018-04-24 ENCOUNTER — Encounter (INDEPENDENT_AMBULATORY_CARE_PROVIDER_SITE_OTHER): Payer: Self-pay

## 2018-04-24 ENCOUNTER — Ambulatory Visit (HOSPITAL_COMMUNITY): Payer: Medicare Other | Attending: Cardiology

## 2018-04-24 DIAGNOSIS — Z951 Presence of aortocoronary bypass graft: Secondary | ICD-10-CM | POA: Insufficient documentation

## 2018-04-24 DIAGNOSIS — I10 Essential (primary) hypertension: Secondary | ICD-10-CM | POA: Diagnosis not present

## 2018-04-24 DIAGNOSIS — I2510 Atherosclerotic heart disease of native coronary artery without angina pectoris: Secondary | ICD-10-CM | POA: Insufficient documentation

## 2018-04-24 DIAGNOSIS — I4891 Unspecified atrial fibrillation: Secondary | ICD-10-CM | POA: Insufficient documentation

## 2018-04-24 DIAGNOSIS — I272 Pulmonary hypertension, unspecified: Secondary | ICD-10-CM | POA: Diagnosis not present

## 2018-04-24 DIAGNOSIS — E785 Hyperlipidemia, unspecified: Secondary | ICD-10-CM | POA: Diagnosis not present

## 2018-04-24 DIAGNOSIS — Z7901 Long term (current) use of anticoagulants: Secondary | ICD-10-CM | POA: Diagnosis not present

## 2018-04-25 LAB — POCT INR: INR: 2 (ref 2.0–3.0)

## 2018-04-26 ENCOUNTER — Ambulatory Visit (INDEPENDENT_AMBULATORY_CARE_PROVIDER_SITE_OTHER): Payer: Medicare Other | Admitting: Internal Medicine

## 2018-04-26 DIAGNOSIS — Z7901 Long term (current) use of anticoagulants: Secondary | ICD-10-CM | POA: Diagnosis not present

## 2018-04-26 DIAGNOSIS — Z5181 Encounter for therapeutic drug level monitoring: Secondary | ICD-10-CM | POA: Diagnosis not present

## 2018-04-26 NOTE — Patient Instructions (Signed)
Description   Spoke with pt, instructed to continue taking 1 tablet every day except 1.5 tablets on Mondays. Recheck INR in 1 week for insurance purposes. (pt is a self tester) # [phone]     

## 2018-05-02 LAB — POCT INR: INR: 2.3 (ref 2.0–3.0)

## 2018-05-03 ENCOUNTER — Ambulatory Visit (INDEPENDENT_AMBULATORY_CARE_PROVIDER_SITE_OTHER): Payer: Medicare Other | Admitting: Internal Medicine

## 2018-05-03 DIAGNOSIS — Z7901 Long term (current) use of anticoagulants: Secondary | ICD-10-CM

## 2018-05-03 DIAGNOSIS — Z5181 Encounter for therapeutic drug level monitoring: Secondary | ICD-10-CM | POA: Diagnosis not present

## 2018-05-03 NOTE — Patient Instructions (Signed)
Description   Spoke with pt, instructed to continue taking 1 tablet every day except 1.5 tablets on Mondays. Recheck INR in 1 week for insurance purposes. (pt is a Personal assistant) # [phone]

## 2018-05-11 LAB — POCT INR: INR: 2 (ref 2.0–3.0)

## 2018-05-12 ENCOUNTER — Ambulatory Visit (INDEPENDENT_AMBULATORY_CARE_PROVIDER_SITE_OTHER): Payer: Medicare Other | Admitting: Cardiology

## 2018-05-12 DIAGNOSIS — Z7901 Long term (current) use of anticoagulants: Secondary | ICD-10-CM

## 2018-05-12 DIAGNOSIS — Z5181 Encounter for therapeutic drug level monitoring: Secondary | ICD-10-CM | POA: Diagnosis not present

## 2018-05-12 NOTE — Patient Instructions (Signed)
Description   Spoke with pt, instructed to continue taking 1 tablet every day except 1.5 tablets on Mondays. Recheck INR in 1 week for insurance purposes. (pt is a Personal assistant) # [phone]

## 2018-05-19 LAB — POCT INR: INR: 2.1 (ref 2.0–3.0)

## 2018-05-22 ENCOUNTER — Ambulatory Visit (INDEPENDENT_AMBULATORY_CARE_PROVIDER_SITE_OTHER): Payer: Medicare Other | Admitting: Cardiology

## 2018-05-22 DIAGNOSIS — Z5181 Encounter for therapeutic drug level monitoring: Secondary | ICD-10-CM | POA: Diagnosis not present

## 2018-05-22 DIAGNOSIS — Z7901 Long term (current) use of anticoagulants: Secondary | ICD-10-CM

## 2018-05-22 NOTE — Patient Instructions (Signed)
Description   Spoke with pt, instructed to continue taking 1 tablet every day except 1.5 tablets on Mondays. Recheck INR in 1 week for insurance purposes. (pt is a Personal assistant) # [phone]

## 2018-05-29 ENCOUNTER — Ambulatory Visit (INDEPENDENT_AMBULATORY_CARE_PROVIDER_SITE_OTHER): Payer: Medicare Other | Admitting: Pharmacist

## 2018-05-29 DIAGNOSIS — Z7901 Long term (current) use of anticoagulants: Secondary | ICD-10-CM | POA: Diagnosis not present

## 2018-05-29 DIAGNOSIS — Z5181 Encounter for therapeutic drug level monitoring: Secondary | ICD-10-CM

## 2018-05-29 LAB — POCT INR: INR: 2.1 (ref 2.0–3.0)

## 2018-06-05 LAB — POCT INR: INR: 2.9 (ref 2.0–3.0)

## 2018-06-06 ENCOUNTER — Ambulatory Visit (INDEPENDENT_AMBULATORY_CARE_PROVIDER_SITE_OTHER): Payer: Medicare Other | Admitting: Cardiovascular Disease

## 2018-06-06 DIAGNOSIS — Z7901 Long term (current) use of anticoagulants: Secondary | ICD-10-CM

## 2018-06-06 DIAGNOSIS — Z5181 Encounter for therapeutic drug level monitoring: Secondary | ICD-10-CM

## 2018-06-06 NOTE — Patient Instructions (Signed)
Description   Spoke with pt, instructed to continue taking 1 tablet every day except 1.5 tablets on Mondays. Recheck INR in 1 week for insurance purposes. (pt is a Personal assistant) # [phone]

## 2018-06-13 ENCOUNTER — Ambulatory Visit (INDEPENDENT_AMBULATORY_CARE_PROVIDER_SITE_OTHER): Payer: Medicare Other | Admitting: Cardiology

## 2018-06-13 DIAGNOSIS — Z7901 Long term (current) use of anticoagulants: Secondary | ICD-10-CM | POA: Diagnosis not present

## 2018-06-13 DIAGNOSIS — Z5181 Encounter for therapeutic drug level monitoring: Secondary | ICD-10-CM | POA: Diagnosis not present

## 2018-06-13 LAB — POCT INR: INR: 2 (ref 2.0–3.0)

## 2018-06-13 NOTE — Progress Notes (Deleted)
Encounter opened in error

## 2018-06-20 ENCOUNTER — Ambulatory Visit (INDEPENDENT_AMBULATORY_CARE_PROVIDER_SITE_OTHER): Payer: Medicare Other | Admitting: Cardiology

## 2018-06-20 DIAGNOSIS — Z5181 Encounter for therapeutic drug level monitoring: Secondary | ICD-10-CM

## 2018-06-20 DIAGNOSIS — Z7901 Long term (current) use of anticoagulants: Secondary | ICD-10-CM

## 2018-06-20 LAB — POCT INR: INR: 2.1 (ref 2.0–3.0)

## 2018-06-27 LAB — POCT INR: INR: 2.1 (ref 2.0–3.0)

## 2018-06-29 ENCOUNTER — Ambulatory Visit (INDEPENDENT_AMBULATORY_CARE_PROVIDER_SITE_OTHER): Payer: Medicare Other | Admitting: Cardiovascular Disease

## 2018-06-29 DIAGNOSIS — Z5181 Encounter for therapeutic drug level monitoring: Secondary | ICD-10-CM | POA: Diagnosis not present

## 2018-06-29 DIAGNOSIS — I4891 Unspecified atrial fibrillation: Secondary | ICD-10-CM

## 2018-06-29 DIAGNOSIS — Z7901 Long term (current) use of anticoagulants: Secondary | ICD-10-CM

## 2018-07-05 ENCOUNTER — Ambulatory Visit (INDEPENDENT_AMBULATORY_CARE_PROVIDER_SITE_OTHER): Payer: Medicare Other | Admitting: Internal Medicine

## 2018-07-05 DIAGNOSIS — Z5181 Encounter for therapeutic drug level monitoring: Secondary | ICD-10-CM | POA: Diagnosis not present

## 2018-07-05 DIAGNOSIS — Z7901 Long term (current) use of anticoagulants: Secondary | ICD-10-CM

## 2018-07-05 LAB — POCT INR: INR: 2.1 (ref 2.0–3.0)

## 2018-07-12 ENCOUNTER — Ambulatory Visit (INDEPENDENT_AMBULATORY_CARE_PROVIDER_SITE_OTHER): Payer: Medicare Other | Admitting: Cardiovascular Disease

## 2018-07-12 DIAGNOSIS — Z5181 Encounter for therapeutic drug level monitoring: Secondary | ICD-10-CM | POA: Diagnosis not present

## 2018-07-12 DIAGNOSIS — Z7901 Long term (current) use of anticoagulants: Secondary | ICD-10-CM

## 2018-07-12 LAB — POCT INR: INR: 2.2 (ref 2.0–3.0)

## 2018-07-21 ENCOUNTER — Ambulatory Visit (INDEPENDENT_AMBULATORY_CARE_PROVIDER_SITE_OTHER): Payer: Medicare Other

## 2018-07-21 DIAGNOSIS — Z5181 Encounter for therapeutic drug level monitoring: Secondary | ICD-10-CM

## 2018-07-21 DIAGNOSIS — Z7901 Long term (current) use of anticoagulants: Secondary | ICD-10-CM | POA: Diagnosis not present

## 2018-07-21 LAB — POCT INR: INR: 2.1 (ref 2.0–3.0)

## 2018-07-21 NOTE — Patient Instructions (Signed)
Description   Spoke with pt, instructed to continue taking 1 tablet every day except 1.5 tablets on Mondays. Recheck INR in 1 week for insurance purposes. (pt is a Personal assistant) # [phone]

## 2018-07-25 ENCOUNTER — Other Ambulatory Visit: Payer: Self-pay | Admitting: Internal Medicine

## 2018-07-28 LAB — POCT INR: INR: 2.1 (ref 2.0–3.0)

## 2018-07-31 ENCOUNTER — Ambulatory Visit (INDEPENDENT_AMBULATORY_CARE_PROVIDER_SITE_OTHER): Payer: Medicare Other | Admitting: Cardiology

## 2018-07-31 DIAGNOSIS — Z7901 Long term (current) use of anticoagulants: Secondary | ICD-10-CM

## 2018-07-31 DIAGNOSIS — Z5181 Encounter for therapeutic drug level monitoring: Secondary | ICD-10-CM

## 2018-08-02 DIAGNOSIS — R0600 Dyspnea, unspecified: Secondary | ICD-10-CM | POA: Diagnosis not present

## 2018-08-02 DIAGNOSIS — R0902 Hypoxemia: Secondary | ICD-10-CM

## 2018-08-02 DIAGNOSIS — R509 Fever, unspecified: Secondary | ICD-10-CM

## 2018-08-02 DIAGNOSIS — A419 Sepsis, unspecified organism: Secondary | ICD-10-CM | POA: Diagnosis not present

## 2018-08-02 DIAGNOSIS — Z944 Liver transplant status: Secondary | ICD-10-CM

## 2018-08-02 DIAGNOSIS — E876 Hypokalemia: Secondary | ICD-10-CM

## 2018-08-02 DIAGNOSIS — J189 Pneumonia, unspecified organism: Secondary | ICD-10-CM | POA: Diagnosis not present

## 2018-08-02 DIAGNOSIS — I959 Hypotension, unspecified: Secondary | ICD-10-CM

## 2018-08-02 DIAGNOSIS — D849 Immunodeficiency, unspecified: Secondary | ICD-10-CM

## 2018-08-02 DIAGNOSIS — I509 Heart failure, unspecified: Secondary | ICD-10-CM | POA: Diagnosis not present

## 2018-08-03 DIAGNOSIS — J189 Pneumonia, unspecified organism: Secondary | ICD-10-CM | POA: Diagnosis not present

## 2018-08-03 DIAGNOSIS — J9601 Acute respiratory failure with hypoxia: Secondary | ICD-10-CM

## 2018-08-04 ENCOUNTER — Telehealth: Payer: Self-pay | Admitting: *Deleted

## 2018-08-04 DIAGNOSIS — J189 Pneumonia, unspecified organism: Secondary | ICD-10-CM | POA: Diagnosis not present

## 2018-08-04 DIAGNOSIS — J9601 Acute respiratory failure with hypoxia: Secondary | ICD-10-CM | POA: Diagnosis not present

## 2018-08-04 NOTE — Telephone Encounter (Signed)
Called the pt as he is due for an INR check. Spoke with the pt & he states he is currently in the [facility] for a GI bug and has been there since Wednesday. Pt will call once he is discharged.

## 2018-08-05 ENCOUNTER — Inpatient Hospital Stay (HOSPITAL_COMMUNITY): Payer: Medicare Other

## 2018-08-05 ENCOUNTER — Inpatient Hospital Stay (HOSPITAL_COMMUNITY)
Admission: AD | Admit: 2018-08-05 | Discharge: 2018-08-09 | DRG: 286 | Disposition: A | Discharge status: Home / Self Care | Payer: Medicare Other | Source: Other Acute Inpatient Hospital | Attending: Cardiovascular Disease | Admitting: Cardiovascular Disease

## 2018-08-05 DIAGNOSIS — Z7901 Long term (current) use of anticoagulants: Secondary | ICD-10-CM

## 2018-08-05 DIAGNOSIS — Z881 Allergy status to other antibiotic agents status: Secondary | ICD-10-CM

## 2018-08-05 DIAGNOSIS — Y832 Surgical operation with anastomosis, bypass or graft as the cause of abnormal reaction of the patient, or of later complication, without mention of misadventure at the time of the procedure: Secondary | ICD-10-CM | POA: Diagnosis present

## 2018-08-05 DIAGNOSIS — A4159 Other Gram-negative sepsis: Secondary | ICD-10-CM | POA: Diagnosis present

## 2018-08-05 DIAGNOSIS — J150 Pneumonia due to Klebsiella pneumoniae: Secondary | ICD-10-CM | POA: Diagnosis present

## 2018-08-05 DIAGNOSIS — Z7952 Long term (current) use of systemic steroids: Secondary | ICD-10-CM | POA: Diagnosis not present

## 2018-08-05 DIAGNOSIS — E7800 Pure hypercholesterolemia, unspecified: Secondary | ICD-10-CM | POA: Diagnosis not present

## 2018-08-05 DIAGNOSIS — I481 Persistent atrial fibrillation: Secondary | ICD-10-CM | POA: Diagnosis not present

## 2018-08-05 DIAGNOSIS — I4891 Unspecified atrial fibrillation: Secondary | ICD-10-CM

## 2018-08-05 DIAGNOSIS — I2510 Atherosclerotic heart disease of native coronary artery without angina pectoris: Secondary | ICD-10-CM | POA: Diagnosis present

## 2018-08-05 DIAGNOSIS — E785 Hyperlipidemia, unspecified: Secondary | ICD-10-CM | POA: Diagnosis present

## 2018-08-05 DIAGNOSIS — M109 Gout, unspecified: Secondary | ICD-10-CM | POA: Diagnosis present

## 2018-08-05 DIAGNOSIS — Z940 Kidney transplant status: Secondary | ICD-10-CM | POA: Diagnosis not present

## 2018-08-05 DIAGNOSIS — D6959 Other secondary thrombocytopenia: Secondary | ICD-10-CM | POA: Diagnosis present

## 2018-08-05 DIAGNOSIS — I10 Essential (primary) hypertension: Secondary | ICD-10-CM | POA: Diagnosis not present

## 2018-08-05 DIAGNOSIS — I509 Heart failure, unspecified: Secondary | ICD-10-CM

## 2018-08-05 DIAGNOSIS — D72819 Decreased white blood cell count, unspecified: Secondary | ICD-10-CM | POA: Diagnosis present

## 2018-08-05 DIAGNOSIS — J9601 Acute respiratory failure with hypoxia: Secondary | ICD-10-CM | POA: Diagnosis not present

## 2018-08-05 DIAGNOSIS — I132 Hypertensive heart and chronic kidney disease with heart failure and with stage 5 chronic kidney disease, or end stage renal disease: Secondary | ICD-10-CM | POA: Diagnosis present

## 2018-08-05 DIAGNOSIS — Z66 Do not resuscitate: Secondary | ICD-10-CM | POA: Diagnosis present

## 2018-08-05 DIAGNOSIS — D693 Immune thrombocytopenic purpura: Secondary | ICD-10-CM | POA: Diagnosis present

## 2018-08-05 DIAGNOSIS — I5022 Chronic systolic (congestive) heart failure: Secondary | ICD-10-CM | POA: Diagnosis present

## 2018-08-05 DIAGNOSIS — I361 Nonrheumatic tricuspid (valve) insufficiency: Secondary | ICD-10-CM | POA: Diagnosis not present

## 2018-08-05 DIAGNOSIS — I25700 Atherosclerosis of coronary artery bypass graft(s), unspecified, with unstable angina pectoris: Secondary | ICD-10-CM | POA: Diagnosis not present

## 2018-08-05 DIAGNOSIS — Z9109 Other allergy status, other than to drugs and biological substances: Secondary | ICD-10-CM

## 2018-08-05 DIAGNOSIS — Z951 Presence of aortocoronary bypass graft: Secondary | ICD-10-CM | POA: Diagnosis not present

## 2018-08-05 DIAGNOSIS — R0600 Dyspnea, unspecified: Secondary | ICD-10-CM

## 2018-08-05 DIAGNOSIS — Z944 Liver transplant status: Secondary | ICD-10-CM | POA: Diagnosis not present

## 2018-08-05 DIAGNOSIS — I4510 Unspecified right bundle-branch block: Secondary | ICD-10-CM | POA: Diagnosis present

## 2018-08-05 DIAGNOSIS — T50915A Adverse effect of multiple unspecified drugs, medicaments and biological substances, initial encounter: Secondary | ICD-10-CM | POA: Diagnosis present

## 2018-08-05 DIAGNOSIS — I25119 Atherosclerotic heart disease of native coronary artery with unspecified angina pectoris: Secondary | ICD-10-CM | POA: Diagnosis present

## 2018-08-05 DIAGNOSIS — Z79899 Other long term (current) drug therapy: Secondary | ICD-10-CM

## 2018-08-05 DIAGNOSIS — I482 Chronic atrial fibrillation: Secondary | ICD-10-CM | POA: Diagnosis not present

## 2018-08-05 DIAGNOSIS — J189 Pneumonia, unspecified organism: Secondary | ICD-10-CM | POA: Diagnosis not present

## 2018-08-05 DIAGNOSIS — T82857A Stenosis of cardiac prosthetic devices, implants and grafts, initial encounter: Secondary | ICD-10-CM | POA: Diagnosis present

## 2018-08-05 DIAGNOSIS — Z91018 Allergy to other foods: Secondary | ICD-10-CM | POA: Diagnosis not present

## 2018-08-05 DIAGNOSIS — I472 Ventricular tachycardia, unspecified: Secondary | ICD-10-CM

## 2018-08-05 DIAGNOSIS — N186 End stage renal disease: Secondary | ICD-10-CM | POA: Diagnosis present

## 2018-08-05 DIAGNOSIS — Z886 Allergy status to analgesic agent status: Secondary | ICD-10-CM | POA: Diagnosis not present

## 2018-08-05 DIAGNOSIS — I4821 Permanent atrial fibrillation: Secondary | ICD-10-CM | POA: Diagnosis present

## 2018-08-05 DIAGNOSIS — Z888 Allergy status to other drugs, medicaments and biological substances status: Secondary | ICD-10-CM

## 2018-08-05 LAB — CBC WITH DIFFERENTIAL/PLATELET
Abs Immature Granulocytes: 0 10*3/uL (ref 0.0–0.1)
Basophils Absolute: 0 10*3/uL (ref 0.0–0.1)
Basophils Relative: 0 %
Eosinophils Absolute: 0 10*3/uL (ref 0.0–0.7)
Eosinophils Relative: 1 %
HCT: 37.7 % — ABNORMAL LOW (ref 39.0–52.0)
Hemoglobin: 11.9 g/dL — ABNORMAL LOW (ref 13.0–17.0)
Immature Granulocytes: 1 %
Lymphocytes Relative: 13 %
Lymphs Abs: 0.5 10*3/uL — ABNORMAL LOW (ref 0.7–4.0)
MCH: 26.9 pg (ref 26.0–34.0)
MCHC: 31.6 g/dL (ref 30.0–36.0)
MCV: 85.1 fL (ref 78.0–100.0)
Monocytes Absolute: 0.2 10*3/uL (ref 0.1–1.0)
Monocytes Relative: 7 %
Neutro Abs: 2.8 10*3/uL (ref 1.7–7.7)
Neutrophils Relative %: 78 %
RBC: 4.43 MIL/uL (ref 4.22–5.81)
RDW: 15.8 % — ABNORMAL HIGH (ref 11.5–15.5)
WBC: 3.6 10*3/uL — ABNORMAL LOW (ref 4.0–10.5)

## 2018-08-05 LAB — BRAIN NATRIURETIC PEPTIDE: B Natriuretic Peptide: 503.5 pg/mL — ABNORMAL HIGH (ref 0.0–100.0)

## 2018-08-05 LAB — TROPONIN I: Troponin I: 0.03 ng/mL (ref <0.03)

## 2018-08-05 LAB — COMPREHENSIVE METABOLIC PANEL
ALT: 14 U/L (ref 0–44)
AST: 16 U/L (ref 15–41)
Albumin: 3.2 g/dL — ABNORMAL LOW (ref 3.5–5.0)
Alkaline Phosphatase: 41 U/L (ref 38–126)
Anion gap: 6 (ref 5–15)
BUN: 14 mg/dL (ref 8–23)
CO2: 26 mmol/L (ref 22–32)
Calcium: 9 mg/dL (ref 8.9–10.3)
Chloride: 109 mmol/L (ref 98–111)
Creatinine, Ser: 1.36 mg/dL — ABNORMAL HIGH (ref 0.61–1.24)
GFR calc Af Amer: >60 mL/min (ref >60)
GFR calc non Af Amer: 53 mL/min — ABNORMAL LOW (ref >60)
Glucose, Bld: 102 mg/dL — ABNORMAL HIGH (ref 70–99)
Potassium: 3 mmol/L — ABNORMAL LOW (ref 3.5–5.1)
Sodium: 141 mmol/L (ref 135–145)
Total Bilirubin: 1.1 mg/dL (ref 0.3–1.2)
Total Protein: 6.2 g/dL — ABNORMAL LOW (ref 6.5–8.1)

## 2018-08-05 LAB — HEMOGLOBIN A1C
Hgb A1c MFr Bld: 5.3 % (ref 4.8–5.6)
Mean Plasma Glucose: 105.41 mg/dL

## 2018-08-05 LAB — PROTIME-INR
INR: 2.72
Prothrombin Time: 28.6 seconds — ABNORMAL HIGH (ref 11.4–15.2)

## 2018-08-05 LAB — MAGNESIUM: Magnesium: 1.9 mg/dL (ref 1.7–2.4)

## 2018-08-05 LAB — APTT: aPTT: 54 seconds — ABNORMAL HIGH (ref 24–36)

## 2018-08-05 MED ORDER — POTASSIUM CHLORIDE CRYS ER 10 MEQ PO TBCR
40.0000 meq | EXTENDED_RELEASE_TABLET | Freq: Every day | ORAL | Status: DC
  Administered 2018-08-06 – 2018-08-09 (×4): 40 meq via ORAL
  Filled 2018-08-05 (×5): qty 4

## 2018-08-05 MED ORDER — FERROUS SULFATE 325 (65 FE) MG PO TABS
325.0000 mg | ORAL_TABLET | Freq: Every day | ORAL | Status: DC
  Administered 2018-08-07 – 2018-08-09 (×3): 325 mg via ORAL
  Filled 2018-08-05 (×3): qty 1

## 2018-08-05 MED ORDER — OMEGA-3-ACID ETHYL ESTERS 1 G PO CAPS
2.0000 g | ORAL_CAPSULE | Freq: Two times a day (BID) | ORAL | Status: DC
  Administered 2018-08-06 – 2018-08-09 (×7): 2 g via ORAL
  Filled 2018-08-05 (×8): qty 2

## 2018-08-05 MED ORDER — PREDNISONE 10 MG PO TABS: 5.0000 mg | ORAL_TABLET | Freq: Every day | ORAL | Status: DC

## 2018-08-05 MED ORDER — CALCITRIOL 0.25 MCG PO CAPS: 0.2500 ug | ORAL_CAPSULE | ORAL | Status: DC

## 2018-08-05 MED ORDER — CARVEDILOL 6.25 MG PO TABS
6.2500 mg | ORAL_TABLET | Freq: Two times a day (BID) | ORAL | Status: DC
  Administered 2018-08-06 (×2): 6.25 mg via ORAL
  Filled 2018-08-05 (×2): qty 1

## 2018-08-05 MED ORDER — METOPROLOL TARTRATE 5 MG/5ML IV SOLN: 5.0000 mg | Freq: Once | INTRAVENOUS | Status: AC

## 2018-08-05 MED ORDER — POTASSIUM CHLORIDE 2 MEQ/ML IV SOLN
40.0000 meq | Freq: Once | INTRAVENOUS | Status: DC
  Filled 2018-08-05 (×2): qty 20

## 2018-08-05 MED ORDER — AMIODARONE HCL IN DEXTROSE 360-4.14 MG/200ML-% IV SOLN
60.0000 mg/h | INTRAVENOUS | Status: DC
  Administered 2018-08-05 (×2): 60 mg/h via INTRAVENOUS
  Filled 2018-08-05: qty 200

## 2018-08-05 MED ORDER — METOPROLOL TARTRATE 5 MG/5ML IV SOLN
INTRAVENOUS | Status: AC
  Administered 2018-08-05: 5 mg
  Filled 2018-08-05: qty 10

## 2018-08-05 MED ORDER — FUROSEMIDE 40 MG PO TABS
40.0000 mg | ORAL_TABLET | Freq: Every day | ORAL | Status: DC
  Administered 2018-08-06 – 2018-08-09 (×4): 40 mg via ORAL
  Filled 2018-08-05 (×4): qty 1

## 2018-08-05 MED ORDER — AMIODARONE HCL IN DEXTROSE 360-4.14 MG/200ML-% IV SOLN
30.0000 mg/h | INTRAVENOUS | Status: DC
  Filled 2018-08-05: qty 200

## 2018-08-05 MED ORDER — MAGNESIUM SULFATE 2 GM/50ML IV SOLN
2.0000 g | Freq: Once | INTRAVENOUS | Status: AC
  Administered 2018-08-05: 2 g via INTRAVENOUS
  Filled 2018-08-05: qty 50

## 2018-08-05 MED ORDER — QUETIAPINE FUMARATE 100 MG PO TABS
100.0000 mg | ORAL_TABLET | Freq: Every day | ORAL | Status: DC
  Administered 2018-08-06 – 2018-08-08 (×4): 100 mg via ORAL
  Filled 2018-08-05 (×4): qty 1

## 2018-08-05 MED ORDER — AMIODARONE LOAD VIA INFUSION
150.0000 mg | Freq: Once | INTRAVENOUS | Status: DC
  Filled 2018-08-05: qty 83.34

## 2018-08-05 MED ORDER — DULOXETINE HCL 60 MG PO CPEP
60.0000 mg | ORAL_CAPSULE | Freq: Every day | ORAL | Status: DC
  Administered 2018-08-06 – 2018-08-08 (×4): 60 mg via ORAL
  Filled 2018-08-05 (×4): qty 1

## 2018-08-05 MED ORDER — ATORVASTATIN CALCIUM 10 MG PO TABS
10.0000 mg | ORAL_TABLET | Freq: Every day | ORAL | Status: DC
  Administered 2018-08-06 – 2018-08-09 (×4): 10 mg via ORAL
  Filled 2018-08-05 (×5): qty 1

## 2018-08-05 MED ORDER — SODIUM CHLORIDE 0.9 % IV SOLN
2.0000 g | INTRAVENOUS | Status: DC
  Administered 2018-08-05 – 2018-08-08 (×4): 2 g via INTRAVENOUS
  Filled 2018-08-05 (×5): qty 20

## 2018-08-05 MED ORDER — NITROGLYCERIN 0.4 MG SL SUBL: 0.4000 mg | SUBLINGUAL_TABLET | SUBLINGUAL | Status: DC | PRN

## 2018-08-05 MED ORDER — POTASSIUM CHLORIDE 10 MEQ/100ML IV SOLN
10.0000 meq | INTRAVENOUS | Status: AC
  Administered 2018-08-05 – 2018-08-06 (×4): 10 meq via INTRAVENOUS
  Filled 2018-08-05 (×4): qty 100

## 2018-08-05 MED ORDER — ALLOPURINOL 100 MG PO TABS
100.0000 mg | ORAL_TABLET | Freq: Every day | ORAL | Status: DC
  Administered 2018-08-06 – 2018-08-09 (×4): 100 mg via ORAL
  Filled 2018-08-05 (×5): qty 1

## 2018-08-05 MED ORDER — ONDANSETRON HCL 4 MG/2ML IJ SOLN: 4.0000 mg | Freq: Four times a day (QID) | INTRAMUSCULAR | Status: DC | PRN

## 2018-08-05 MED ORDER — ASPIRIN EC 81 MG PO TBEC
81.0000 mg | DELAYED_RELEASE_TABLET | Freq: Every day | ORAL | Status: DC
  Administered 2018-08-06 – 2018-08-09 (×5): 81 mg via ORAL
  Filled 2018-08-05 (×5): qty 1

## 2018-08-05 MED ORDER — AMIODARONE HCL IN DEXTROSE 360-4.14 MG/200ML-% IV SOLN
INTRAVENOUS | Status: AC
  Administered 2018-08-05: 1 mg/h
  Filled 2018-08-05: qty 200

## 2018-08-05 MED ORDER — TACROLIMUS 0.5 MG PO CAPS
0.5000 mg | ORAL_CAPSULE | Freq: Two times a day (BID) | ORAL | Status: DC
  Administered 2018-08-06 – 2018-08-09 (×8): 0.5 mg via ORAL
  Filled 2018-08-05 (×10): qty 1

## 2018-08-05 MED ORDER — ACETAMINOPHEN 325 MG PO TABS: 650.0000 mg | ORAL_TABLET | ORAL | Status: DC | PRN

## 2018-08-05 MED ORDER — SODIUM CHLORIDE 0.9 % IV SOLN
INTRAVENOUS | Status: DC | PRN
  Administered 2018-08-05: 1000 mL via INTRAVENOUS

## 2018-08-05 NOTE — Progress Notes (Signed)
RR page to room, on arrival pt lying supine in bed. Dr. Georgette Shell at bedside. Per RN pt had a run of VT. HR 182  5 mg Metoprolol and 150 ml bolus of Amiodarone given just as arrived. Pt converted to NSR 86

## 2018-08-05 NOTE — Progress Notes (Signed)
Cards called. Waiting for orders. [facility] reports pt. Had an other run of Vtach during transport and then went into afib RVR where pt. Felt chest pain.

## 2018-08-05 NOTE — H&P (Addendum)
History & Physical    Patient ID: [name] MRN: [id number], DOB/AGE: 1951/10/02   Admit date: 08/05/2018   Primary Physician: [name]., MD Primary Cardiologist: No primary care provider on file.  Patient Profile    67 year old gentleman with CAD s/p CABG in 1992, HTN, HL, atrial fibrillation, ESRD s/p renal transplant, ESLD s/p liver transplant who presented to [facility] on 9/25 due to increased shortness of breath and fever. Treated for CAP but developed recurrent episodes of VT for which he was transferred to [facility].   Past Medical History    Past Medical History:  Diagnosis Date  . Atrial fibrillation ([place]) 08/2009  . CAD (coronary artery disease)    s/p CABG -- 1992  . ESRD (end stage renal disease) ([place])   . HLD (hyperlipidemia)   . HTN (hypertension)   . Idiopathic thrombocytopenia ([facility])     Past Surgical History:  Procedure Laterality Date  . APPENDECTOMY    . CORONARY ARTERY BYPASS GRAFT    . FACIAL COSMETIC SURGERY    . GALLBLADDER SURGERY    . HERNIA REPAIR    . KIDNEY TRANSPLANT    . LIVER TRANSPLANT       Allergies  Allergies  Allergen Reactions  . Grapefruit Concentrate Other (See Comments)    Pt has been told not to eat grapefruit  . Haloperidol Other (See Comments)    Too sedated  . Nsaids Other (See Comments)    Cannot take due to liver transplant  . Rifampin Other (See Comments)    Caused flushing  . Triazolam Other (See Comments)    Too sedated    History of Present Illness    [name] is a 67 year old gentleman with CAD s/p CABG in 1992, HTN, HL, atrial fibrillation, ESRD s/p renal transplant, ESLD s/p liver transplant who presented to [facility] on 9/25 due to increased shortness of breath and fever. Treated for CAP but developed recurrent episodes of VT for which he was transferred to [facility].   The patient reports that he was doing well, in his baseline state of health until the night prior  to admission. He noted significant increase in his baseline mild shortness of breath with exertion. Also noted fever, chills. Due to these symptoms he presented to [facility] where he underwent treatment for sepsis 2/2 klebsiella pneumonia. He had improved with antibiotic treatment and was scheduled to be discharged today when he developed recurrent runs of NSVT followed by sustained VT. His case was discussed with Dr. Sallyanne Kuster who accepted the patient in transfer. During ambulance transfer, patient was noted to have recurrent sustained, but self-limited VT lasting approximately 1 minute. Shortly after admission to [facility], patient had additional 1 minute run of VT followed shortly after by sustained VT in the 180s for approximately 10 minutes. Patient remained hemodynamically stable during that time. He was given IV amiodarone and IV metoprolol and returned to atrial fibrillation with HRs in the 70s. During episodes of VT, patient reports increase in dyspnea as well as chest discomfort. No history of VT previously, does not have an ICD in place.   Home Medications    Prior to Admission medications   Medication Sig Start Date End Date Taking? Authorizing Provider  acetaminophen (TYLENOL) 500 MG tablet Take 1,000 mg by mouth every 6 (six) hours as needed for headache (pain).    [provider]  allopurinol (ZYLOPRIM) 100 MG tablet Take 100 mg  by mouth daily.    [provider]  atorvastatin (LIPITOR) 10 MG tablet Take 1 tablet (10 mg total) by mouth daily. 01/02/16   Deboraha Sprang, MD  atorvastatin (LIPITOR) 10 MG tablet TAKE 1 TABLET ONCE DAILY. 02/25/17   Deboraha Sprang, MD  calcitRIOL (ROCALTROL) 0.25 MCG capsule Take 0.25 mcg by mouth every other day.    [provider]  carvedilol (COREG) 6.25 MG tablet Take 1 tablet (6.25 mg total) by mouth 2 (two) times daily. 12/20/16   [name], MD  Cyanocobalamin (VITAMIN B12 PO) Take 1 tablet by mouth daily.     [provider]  DULoxetine (CYMBALTA) 60 MG capsule Take 60 mg by mouth at bedtime.    [provider]  EPINEPHrine (EPIPEN) 0.3 mg/0.3 mL DEVI Inject 0.3 mg into the muscle once as needed (severe allergic reaction).     [provider]  ferrous sulfate 325 (65 FE) MG tablet Take 325 mg by mouth daily with breakfast.    [provider]  fish oil-omega-3 fatty acids 1000 MG capsule Take 2 g by mouth 2 (two) times daily.     [provider]  furosemide (LASIX) 40 MG tablet Take 1 tablet by mouth 2 (two) times daily. 02/27/18   [provider]  Melatonin 1 MG TABS Take 1 mg by mouth at bedtime.    [provider]  nitroGLYCERIN (NITROSTAT) 0.4 MG SL tablet Place 0.4 mg under the tongue every 5 (five) minutes as needed for chest pain.    [provider]  predniSONE (DELTASONE) 5 MG tablet Take 1 tablet (5 mg total) by mouth daily. 12/29/16   [name], MD  QUEtiapine (SEROQUEL) 100 MG tablet Take 100 mg by mouth at bedtime. 07/18/15   [provider]  tacrolimus (PROGRAF) 0.5 MG capsule Take 1 mg by mouth 2 (two) times daily.     [provider]  torsemide (DEMADEX) 20 MG tablet Take 20 mg by mouth daily.    [provider]  traZODone (DESYREL) 150 MG tablet Take 150 mg by mouth at bedtime.    [provider]  warfarin (COUMADIN) 4 MG tablet TAKE AS DIRECTED BY COUMADIN CLINIC. 07/25/18   Deboraha Sprang, MD    Family History    Family History  Problem Relation Age of Onset  . Atrial fibrillation Mother   . Breast cancer Mother   . Renal cancer Father   . Hypertension Brother   . Hyperlipidemia Brother   . Other Brother        stent   . Renal Disease Maternal Grandmother   . Stroke Maternal Grandmother   . Heart attack Maternal Grandfather   . Cancer Paternal Grandmother   . Heart failure Paternal Grandfather   . Emphysema Paternal Grandfather   . Bronchiolitis Paternal  Grandfather    He indicated that his mother is alive. He indicated that his father is deceased. He indicated that his brother is alive. He indicated that his maternal grandmother is deceased. He indicated that his maternal grandfather is deceased. He indicated that his paternal grandmother is deceased. He indicated that his paternal grandfather is deceased.   Social History    Social History   Socioeconomic History  . Marital status: Single    Spouse name: Not on file  . Number of children: 0  . Years of education: Not on file  . Highest education level: Not on file  Occupational History  .  Occupation: retired  Scientific laboratory technician  . Financial resource strain: Not on file  . Food insecurity:    Worry: Not on file    Inability: Not on file  . Transportation needs:    Medical: Not on file    Non-medical: Not on file  Tobacco Use  . Smoking status: Never Smoker  . Smokeless tobacco: Never Used  Substance and Sexual Activity  . Alcohol use: No  . Drug use: No  . Sexual activity: Not on file  Lifestyle  . Physical activity:    Days per week: Not on file    Minutes per session: Not on file  . Stress: Not on file  Relationships  . Social connections:    Talks on phone: Not on file    Gets together: Not on file    Attends religious service: Not on file    Active member of club or organization: Not on file    Attends meetings of clubs or organizations: Not on file    Relationship status: Not on file  . Intimate partner violence:    Fear of current or ex partner: Not on file    Emotionally abused: Not on file    Physically abused: Not on file    Forced sexual activity: Not on file  Other Topics Concern  . Not on file  Social History Narrative  . Not on file     Review of Systems    General:  No chills, fever, night sweats or weight changes.  Cardiovascular:  No chest pain, dyspnea on exertion, edema, orthopnea, palpitations, paroxysmal nocturnal dyspnea. Dermatological: No  rash, lesions/masses Respiratory: No cough, dyspnea Urologic: No hematuria, dysuria Abdominal:   No nausea, vomiting, diarrhea, bright red blood per rectum, melena, or hematemesis Neurologic:  No visual changes, wkns, changes in mental status. All other systems reviewed and are otherwise negative except as noted above.  Physical Exam    Blood pressure (!) 145/131, pulse 79, temperature 98.4 F (36.9 C), temperature source Oral, resp. rate 18, height 5\' 10"  (1.778 m), weight 92.6 kg, SpO2 100 %.  General: Pleasant, appears older than stated age.  Psych: Normal affect. Neuro: Alert and oriented X 3. Moves all extremities spontaneously. HEENT: Normal  Neck: Supple without bruits or JVD. Lungs:  Resp regular and unlabored, CTA. Heart: Irregularly irregular rhythm. No m/r/g. Abdomen: Soft, non-tender, non-distended, BS + x 4.  Extremities: No clubbing, cyanosis. Trace pitting edema bilaterally. DP/PT/Radials 2+ and equal bilaterally.  Labs    Troponin (Point of Care Test) No results for input(s): TROPIPOC in the last 72 hours. Recent Labs    08/05/18 1839  TROPONINI 0.03*   Lab Results  Component Value Date   WBC 3.6 (L) 08/05/2018   HGB 11.9 (L) 08/05/2018   HCT 37.7 (L) 08/05/2018   MCV 85.1 08/05/2018   PLT CONSISTENT WITH PREVIOUS RESULT 08/05/2018    Recent Labs  Lab 08/05/18 1839  NA 141  K 3.0*  CL 109  CO2 26  BUN 14  CREATININE 1.36*  CALCIUM 9.0  PROT 6.2*  BILITOT 1.1  ALKPHOS 41  ALT 14  AST 16  GLUCOSE 102*   Lab Results  Component Value Date   CHOL 175 12/18/2016   HDL 25 (L) 12/18/2016   LDLCALC 129 (H) 12/18/2016   TRIG 105 12/18/2016   No results found for: [facility]   Radiology Studies    Dg Chest Port 1 View  Result Date: 08/05/2018 CLINICAL DATA:  Tachycardia, shortness  of breath, AFib EXAM: PORTABLE CHEST 1 VIEW COMPARISON:  08/03/2017 FINDINGS: Mild left perihilar opacity. Mild right infrahilar opacity/atelectasis with associated  eventration of the right hemidiaphragm. Trace left pleural effusion. Cardiomegaly.  Postsurgical changes related to prior CABG. Median sternotomy. IMPRESSION: Cardiomegaly with suspected mild perihilar edema. Mild right infrahilar opacity/atelectasis with associated eventration of the right hemidiaphragm. Trace left pleural effusion. Electronically Signed   By: Julian Hy M.D.   On: 08/05/2018 19:04    ECG & Cardiac Imaging    TTE 04/24/18 - Left ventricle: The cavity size was mildly dilated. Wall   thickness was increased in a pattern of moderate LVH. Systolic   function was normal. The estimated ejection fraction was in the   range of 50% to 55%. There is hypokinesis of the   basalinferolateral myocardium. Doppler parameters are consistent   with high ventricular filling pressure. - Aortic valve: There was trivial regurgitation. - Mitral valve: Calcified annulus. There was mild regurgitation. - Left atrium: The atrium was moderately dilated. - Right ventricle: The cavity size was mildly dilated. - Right atrium: The atrium was mildly dilated. - Pulmonary arteries: Systolic pressure was mildly to moderately   increased. PA peak pressure: 48 mm Hg (S).  EKG - 1855 Wide complex tachycardia with concordance across precordial leads.   EKG - 1858 Atrial fibrillation with rate of 78. No acute ischemia   Assessment & Plan    67 year old gentleman with CAD s/p CABG in 1992, HTN, HL, atrial fibrillation, ESRD s/p renal transplant, ESLD s/p liver transplant who presented to [facility] on 9/25 due to increased shortness of breath and fever. Treated for CAP but developed recurrent episodes of VT for which he was transferred to [facility].   # Ventricular tachycardia Unclear etiology. Appears monomorphic with right bundle morphology on ECG during episode of sustained VT. May be driven my ischemia, although without clear ischemic symptoms, versus scar mediated in patient with known CAD  s/p CABG in 1992 with grafts to LAD, OM1, OM3 and ramus.  Most recent cath in 2008 showed 100% occlusion of LAD, LCx and RCA. Grafts patent with exception of OM1 graft which was occluded. Previously with depressed EF in the setting of acute illness but last TTE with EF 50-55%. QTc is not prolonged.  - Discussed with pharmacy and no contraindication for use of amio or lido in setting of tacrolimus for history of transplant - Bolus 150 IV amio followed by IV amio load - Consider IV lidocaine if ongoing VT - Given 5mg  IV metoprolol x1 shortly after arrival - Continue coreg at 6.25mg  BID, may need to titrate further - supplement potassium and magnesium - Plan for [facility] on Monday for ischemic evaluation - EP consult in AM - TTE ordered  # CAD s/p CABG Coronary anatomy as above. Patient denies ischemic symptoms prior to admission. Has chest discomfort during episodes of VT but not otherwise. Initial troponin detectable at 0.03. - As above, will need to evaluate for ischemic etiology of VT although may be scar mediated - Trend troponin - Plan for [facility] on Monday - Will start ASA currently, not on as outpatient in setting of chronic anticoagulation with warfarin.  - Cont home atorvastatin; consider titration to high dose  - Cont home fish oil  # HFpEF Previously with reduced ejection fraction but most recent evaluation with EF 50-55% as noted above. Appears euvolemic on examination - Cont coreg - Cont home lasix  # Atrial fibrillation Rhythm consistent with  atrial fibrillation when patient not having episode of VT. Known history and on warfarin therapy as outpatient - Hold warfarin at this time - Heparin gtt when INR <2.0 - Continue coreg as above, may need additional titration  # s/p Renal and liver transplant Creatinine mildly elevated to 1.36 on admission. Has been elevated previously here as well and appears similar to levels recently at [facility] so likely patient's baseline.  - Continue home  prednisone - Continue home tacrolimus - Tacrolimus level pending - Cont calcitriol  # Sepsis 2/2 CAP Improved following treatment for klebsiella. Was receiving antibiotic therapy at [facility] since 9/25, most recently on ceftriaxone. - Continue ceftriaxone, will plan for at least 7 day course.   # HTN Blood pressure elevated on arrival - Continue coreg as above  # Chronic thrombocytopenia Likely related to medications, platelets 48 at OSH which is similar to prior levels here.  - No active bleeding, continue to monitor  # Gout - Cont home allopurinol  # DNAR Discussed at length with patient, including defibrillation for VT. Noted that he would not want defibrillation, chest compressions or intubation and that he'd rather "go on".   Signed, Bryna Colander, MD 08/05/2018, 7:36 PM

## 2018-08-05 NOTE — Progress Notes (Signed)
Pts. HR sustaining in the 180s, MD called and at bedside. Verbal order for 5 mg of Metoprolol and 150mg  bolus of Amiodarone. Amoiodarone not given with filter due to not having one available. MD aware. Will wait for filter to continue gtt. And switch IVs.

## 2018-08-05 NOTE — Progress Notes (Signed)
ANTICOAGULATION CONSULT NOTE - Initial Consult  Pharmacy Consult for Heparin when INR<2 Indication: atrial fibrillation  Allergies  Allergen Reactions  . Grapefruit Concentrate Other (See Comments)    Pt has been told not to eat grapefruit  . Haloperidol Other (See Comments)    Too sedated  . Nsaids Other (See Comments)    Cannot take due to liver transplant  . Rifampin Other (See Comments)    Caused flushing  . Triazolam Other (See Comments)    Too sedated    Patient Measurements: Height: 5\' 10"  (177.8 cm) Weight: 204 lb 2.3 oz (92.6 kg) IBW/kg (Calculated) : 73 Heparin Dosing Weight: 91 kg  Vital Signs: Temp: 98.4 F (36.9 C) (09/28 1800) Temp Source: Oral (09/28 1800) BP: 137/120 (09/28 1814) Pulse Rate: 91 (09/28 1814)  Labs: No results for input(s): HGB, HCT, PLT, APTT, LABPROT, INR, HEPARINUNFRC, HEPRLOWMOCWT, CREATININE, CKTOTAL, CKMB, TROPONINI in the last 72 hours.  CrCl cannot be calculated (Patient's most recent lab result is older than the maximum 21 days allowed.).   Medical History: Past Medical History:  Diagnosis Date  . Atrial fibrillation ([place]) 08/2009  . CAD (coronary artery disease)    s/p CABG -- 1992  . ESRD (end stage renal disease) ([place])   . HLD (hyperlipidemia)   . HTN (hypertension)   . Idiopathic thrombocytopenia ([facility])     Assessment: [name] on warfarin PTA for hx Afib who presented on 9/28 with Afib with RVR. Pharmacy consulted for heparin dosing.   Admit INR is therapeutic (INR 2.72, goal of 2-3). Hgb 11.9, plts pending. Discussed with MD and will plan to hold warfarin and start Heparin when INR<2.  Goal of Therapy:  Heparin level 0.3-0.7 units/ml Monitor platelets by anticoagulation protocol: Yes   Plan:  - No heparin for now - Will plan to start when INR<2 - Will continue to monitor for any signs/symptoms of bleeding and will follow up with PT/INR in the a.m.   Thank you for allowing pharmacy to be a part of this patient's  care.  Alycia Rossetti, PharmD, BCPS Clinical Pharmacist Pager: [phone] Please check AMION for all [place] numbers 08/05/2018 7:10 PM

## 2018-08-06 ENCOUNTER — Encounter (HOSPITAL_COMMUNITY): Payer: Self-pay

## 2018-08-06 ENCOUNTER — Other Ambulatory Visit: Payer: Self-pay

## 2018-08-06 ENCOUNTER — Inpatient Hospital Stay (HOSPITAL_COMMUNITY): Payer: Medicare Other

## 2018-08-06 DIAGNOSIS — I472 Ventricular tachycardia: Secondary | ICD-10-CM

## 2018-08-06 DIAGNOSIS — I25700 Atherosclerosis of coronary artery bypass graft(s), unspecified, with unstable angina pectoris: Secondary | ICD-10-CM

## 2018-08-06 DIAGNOSIS — I482 Chronic atrial fibrillation: Secondary | ICD-10-CM

## 2018-08-06 DIAGNOSIS — Z944 Liver transplant status: Secondary | ICD-10-CM

## 2018-08-06 DIAGNOSIS — I10 Essential (primary) hypertension: Secondary | ICD-10-CM

## 2018-08-06 DIAGNOSIS — I361 Nonrheumatic tricuspid (valve) insufficiency: Secondary | ICD-10-CM

## 2018-08-06 DIAGNOSIS — E7800 Pure hypercholesterolemia, unspecified: Secondary | ICD-10-CM

## 2018-08-06 DIAGNOSIS — I481 Persistent atrial fibrillation: Secondary | ICD-10-CM

## 2018-08-06 DIAGNOSIS — Z940 Kidney transplant status: Secondary | ICD-10-CM

## 2018-08-06 LAB — LIPID PANEL
Cholesterol: 124 mg/dL (ref 0–200)
HDL: 25 mg/dL — ABNORMAL LOW (ref >40)
LDL Cholesterol: 80 mg/dL (ref 0–99)
Total CHOL/HDL Ratio: 5 RATIO
Triglycerides: 96 mg/dL (ref <150)
VLDL: 19 mg/dL (ref 0–40)

## 2018-08-06 LAB — PROTIME-INR
INR: 2.21
Prothrombin Time: 24.3 seconds — ABNORMAL HIGH (ref 11.4–15.2)

## 2018-08-06 LAB — BASIC METABOLIC PANEL
Anion gap: 10 (ref 5–15)
BUN: 17 mg/dL (ref 8–23)
CO2: 23 mmol/L (ref 22–32)
Calcium: 9.2 mg/dL (ref 8.9–10.3)
Chloride: 108 mmol/L (ref 98–111)
Creatinine, Ser: 1.33 mg/dL — ABNORMAL HIGH (ref 0.61–1.24)
GFR calc Af Amer: >60 mL/min (ref >60)
GFR calc non Af Amer: 55 mL/min — ABNORMAL LOW (ref >60)
Glucose, Bld: 111 mg/dL — ABNORMAL HIGH (ref 70–99)
Potassium: 3.6 mmol/L (ref 3.5–5.1)
Sodium: 141 mmol/L (ref 135–145)

## 2018-08-06 LAB — CBC
HCT: 36.4 % — ABNORMAL LOW (ref 39.0–52.0)
Hemoglobin: 11.2 g/dL — ABNORMAL LOW (ref 13.0–17.0)
MCH: 26.7 pg (ref 26.0–34.0)
MCHC: 30.8 g/dL (ref 30.0–36.0)
MCV: 86.9 fL (ref 78.0–100.0)
Platelets: 48 10*3/uL — ABNORMAL LOW (ref 150–400)
RBC: 4.19 MIL/uL — ABNORMAL LOW (ref 4.22–5.81)
RDW: 15.7 % — ABNORMAL HIGH (ref 11.5–15.5)
WBC: 2.4 10*3/uL — ABNORMAL LOW (ref 4.0–10.5)

## 2018-08-06 LAB — TROPONIN I
Troponin I: 0.04 ng/mL (ref <0.03)
Troponin I: 0.03 ng/mL (ref <0.03)

## 2018-08-06 LAB — HIV ANTIBODY (ROUTINE TESTING W REFLEX): HIV Screen 4th Generation wRfx: NONREACTIVE

## 2018-08-06 LAB — T4, FREE: Free T4: 1.32 ng/dL (ref 0.82–1.77)

## 2018-08-06 LAB — TSH: TSH: 2.384 u[IU]/mL (ref 0.350–4.500)

## 2018-08-06 LAB — MRSA PCR SCREENING: MRSA by PCR: NEGATIVE

## 2018-08-06 MED ORDER — SODIUM CHLORIDE 0.9% FLUSH: 3.0000 mL | INTRAVENOUS | Status: DC | PRN

## 2018-08-06 MED ORDER — AMIODARONE HCL IN DEXTROSE 360-4.14 MG/200ML-% IV SOLN
30.0000 mg/h | INTRAVENOUS | Status: DC
  Administered 2018-08-06 – 2018-08-08 (×5): 30 mg/h via INTRAVENOUS
  Filled 2018-08-06 (×4): qty 200

## 2018-08-06 MED ORDER — SODIUM CHLORIDE 0.9 % WEIGHT BASED INFUSION
3.0000 mL/kg/h | INTRAVENOUS | Status: AC
  Administered 2018-08-07: 3 mL/kg/h via INTRAVENOUS

## 2018-08-06 MED ORDER — TRAZODONE HCL 50 MG PO TABS
100.0000 mg | ORAL_TABLET | Freq: Every day | ORAL | Status: DC
  Administered 2018-08-06 – 2018-08-08 (×4): 100 mg via ORAL
  Filled 2018-08-06 (×5): qty 2

## 2018-08-06 MED ORDER — FUROSEMIDE 10 MG/ML IJ SOLN
40.0000 mg | Freq: Once | INTRAMUSCULAR | Status: AC
  Administered 2018-08-06: 40 mg via INTRAVENOUS
  Filled 2018-08-06: qty 4

## 2018-08-06 MED ORDER — SODIUM CHLORIDE 0.9 % IV SOLN: 250.0000 mL | INTRAVENOUS | Status: DC | PRN

## 2018-08-06 MED ORDER — PREDNISONE 10 MG PO TABS
5.0000 mg | ORAL_TABLET | Freq: Every day | ORAL | Status: DC
  Administered 2018-08-06 – 2018-08-09 (×4): 5 mg via ORAL
  Filled 2018-08-06 (×4): qty 1

## 2018-08-06 MED ORDER — LIDOCAINE HCL (CARDIAC) PF 50 MG/5ML IV SOSY
100.0000 mg | PREFILLED_SYRINGE | Freq: Once | INTRAVENOUS | Status: DC
  Filled 2018-08-06: qty 10

## 2018-08-06 MED ORDER — ASPIRIN 81 MG PO CHEW
81.0000 mg | CHEWABLE_TABLET | ORAL | Status: AC
  Administered 2018-08-07: 81 mg via ORAL
  Filled 2018-08-06: qty 1

## 2018-08-06 MED ORDER — LIDOCAINE HCL (CARDIAC) PF 100 MG/5ML IV SOSY
100.0000 mg | PREFILLED_SYRINGE | Freq: Once | INTRAVENOUS | Status: AC
  Administered 2018-08-06: 100 mg via INTRAVENOUS
  Filled 2018-08-06: qty 5

## 2018-08-06 MED ORDER — SODIUM CHLORIDE 0.9% FLUSH: 3.0000 mL | Freq: Two times a day (BID) | INTRAVENOUS | Status: DC

## 2018-08-06 MED ORDER — CARVEDILOL 12.5 MG PO TABS
12.5000 mg | ORAL_TABLET | Freq: Two times a day (BID) | ORAL | Status: DC
  Administered 2018-08-06 – 2018-08-08 (×4): 12.5 mg via ORAL
  Filled 2018-08-06 (×4): qty 1

## 2018-08-06 MED ORDER — SODIUM CHLORIDE 0.9 % WEIGHT BASED INFUSION
1.0000 mL/kg/h | INTRAVENOUS | Status: DC
  Administered 2018-08-07 (×2): 1 mL/kg/h via INTRAVENOUS

## 2018-08-06 NOTE — Progress Notes (Signed)
  Echocardiogram 2D Echocardiogram has been performed.  [name] 08/06/2018, 5:59 PM

## 2018-08-06 NOTE — Consult Note (Signed)
ELECTROPHYSIOLOGY CONSULT NOTE    Patient ID: [name] MRN: [id number], DOB/AGE: 1951/09/23 67 y.o.  Admit date: 08/05/2018 Date of Consult: 08/06/2018  Primary Physician: [name]., MD   Electrophysiologist: Dr [name]  Patient Profile: [name] is a 67 y.o. male with a history of VT who is being seen today for the evaluation of VT at the request of Dr Sallyanne Kuster.  HPI:  [name] is a 67 y.o. male is admitted on transfer from [facility] with symptomatic sustained VT.  He has a h/o VT 2 years ago he reports but has done very well since.  Over the past 2 days, he has had episodes of abrupt SOB with diaphoresis and tachypalpitations.  He was evaluated at [facility] and observed to have episodes of VT as the cause.  He was placed on amiodarone and transferred to [facility] for further evaluation.  Overnight, his episodes have improved.  He denies chest pain, orthopnea, nausea, vomiting, dizziness, syncope, or edema..  Past Medical History:  Diagnosis Date  . Atrial fibrillation ([place]) 08/2009  . CAD (coronary artery disease)    s/p CABG -- 1992  . ESRD (end stage renal disease) ([place])   . HLD (hyperlipidemia)   . HTN (hypertension)   . Idiopathic thrombocytopenia ([place])      Surgical History:  Past Surgical History:  Procedure Laterality Date  . APPENDECTOMY    . CORONARY ARTERY BYPASS GRAFT    . FACIAL COSMETIC SURGERY    . GALLBLADDER SURGERY    . HERNIA REPAIR    . KIDNEY TRANSPLANT    . LIVER TRANSPLANT       Medications Prior to Admission  Medication Sig Dispense Refill Last Dose  . acetaminophen (TYLENOL) 500 MG tablet Take 1,000 mg by mouth every 6 (six) hours as needed for headache (pain).   unk  . albuterol (PROVENTIL) (2.5 MG/3ML) 0.083% nebulizer solution Take 2.5 mg by nebulization every 2 (two) hours as needed for wheezing or shortness of breath.   unk  . allopurinol (ZYLOPRIM) 100 MG tablet Take 100 mg by mouth  daily.   08/02/2018  . atorvastatin (LIPITOR) 10 MG tablet Take 1 tablet (10 mg total) by mouth daily. (Patient taking differently: Take 20 mg by mouth daily. ) 90 tablet 3 08/01/2018  . carvedilol (COREG) 6.25 MG tablet Take 1 tablet (6.25 mg total) by mouth 2 (two) times daily. 60 tablet 0 08/02/2018  . Cyanocobalamin (VITAMIN B12) 500 MCG TABS Take 500 mcg by mouth daily.    08/01/2018  . DULoxetine (CYMBALTA) 60 MG capsule Take 60 mg by mouth at bedtime.   08/02/2018  . EPINEPHrine (EPIPEN) 0.3 mg/0.3 mL DEVI Inject 0.3 mg into the muscle once as needed (severe allergic reaction).    unk  . ferrous sulfate 325 (65 FE) MG tablet Take 325 mg by mouth daily with breakfast.   08/02/2018  . fish oil-omega-3 fatty acids 1000 MG capsule Take 2 g by mouth daily.    08/02/2018  . furosemide (LASIX) 40 MG tablet Take 1 tablet by mouth daily.    08/02/2018  . Melatonin 1 MG TABS Take 1 mg by mouth at bedtime.   more than a month  . nitroGLYCERIN (NITROSTAT) 0.4 MG SL tablet Place 0.4 mg under the tongue every 5 (five) minutes as needed for chest pain.   unk  . potassium chloride (K-DUR,KLOR-CON) 10 MEQ tablet Take 10 mEq by mouth daily.   08/02/2018  .  predniSONE (DELTASONE) 5 MG tablet Take 1 tablet (5 mg total) by mouth daily.   08/02/2018  . QUEtiapine (SEROQUEL) 100 MG tablet Take 100 mg by mouth at bedtime.   08/01/2018  . tacrolimus (PROGRAF) 0.5 MG capsule Take 0.5 mg by mouth 2 (two) times daily.    08/02/2018  . traZODone (DESYREL) 150 MG tablet Take 150 mg by mouth at bedtime as needed for sleep.    more than a month  . warfarin (COUMADIN) 4 MG tablet TAKE AS DIRECTED BY COUMADIN CLINIC. (Patient taking differently: Take 4 mg by mouth at bedtime. ) 40 tablet 2 08/01/2018    Inpatient Medications:  . allopurinol  100 mg Oral Daily  . aspirin EC  81 mg Oral Daily  . atorvastatin  10 mg Oral Daily  . calcitRIOL  0.25 mcg Oral QODAY  . carvedilol  12.5 mg Oral BID WC  . DULoxetine  60 mg Oral QHS  .  ferrous sulfate  325 mg Oral Q breakfast  . furosemide  40 mg Oral Daily  . omega-3 acid ethyl esters  2 g Oral BID  . potassium chloride  40 mEq Oral Daily  . predniSONE  5 mg Oral Q breakfast  . QUEtiapine  100 mg Oral QHS  . tacrolimus  0.5 mg Oral BID  . traZODone  100 mg Oral QHS    Allergies:  Allergies  Allergen Reactions  . Other Anaphylaxis    Spider venom  . Grapefruit Concentrate Other (See Comments)    Pt has been told not to eat grapefruit  . Haloperidol Other (See Comments)    Too sedated  . Nsaids Other (See Comments)    Cannot take due to liver transplant  . Rifampin Other (See Comments)    Caused flushing  . Triazolam Other (See Comments)    Too sedated    Social History   Socioeconomic History  . Marital status: Single    Spouse name: Not on file  . Number of children: 0  . Years of education: Not on file  . Highest education level: Not on file  Occupational History  . Occupation: retired  Scientific laboratory technician  . Financial resource strain: Not on file  . Food insecurity:    Worry: Not on file    Inability: Not on file  . Transportation needs:    Medical: Not on file    Non-medical: Not on file  Tobacco Use  . Smoking status: Never Smoker  . Smokeless tobacco: Never Used  Substance and Sexual Activity  . Alcohol use: No  . Drug use: No  . Sexual activity: Not on file  Lifestyle  . Physical activity:    Days per week: Not on file    Minutes per session: Not on file  . Stress: Not on file  Relationships  . Social connections:    Talks on phone: Not on file    Gets together: Not on file    Attends religious service: Not on file    Active member of club or organization: Not on file    Attends meetings of clubs or organizations: Not on file    Relationship status: Not on file  . Intimate partner violence:    Fear of current or ex partner: Not on file    Emotionally abused: Not on file    Physically abused: Not on file    Forced sexual activity:  Not on file  Other Topics Concern  . Not on file  Social History Narrative  . Not on file     Family History  Problem Relation Age of Onset  . Atrial fibrillation Mother   . Breast cancer Mother   . Renal cancer Father   . Hypertension Brother   . Hyperlipidemia Brother   . Other Brother        stent   . Renal Disease Maternal Grandmother   . Stroke Maternal Grandmother   . Heart attack Maternal Grandfather   . Cancer Paternal Grandmother   . Heart failure Paternal Grandfather   . Emphysema Paternal Grandfather   . Bronchiolitis Paternal Grandfather      Review of Systems: All other systems reviewed and are otherwise negative except as noted above.  Physical Exam: Vitals:   08/06/18 0100 08/06/18 0300 08/06/18 0830 08/06/18 0847  BP: 115/71 104/75 98/66 98/66   Pulse: 88 84 76 78  Resp: (!) 28 16 (!) 26 17  Temp:  97.7 F (36.5 C)  97.7 F (36.5 C)  TempSrc:  Oral  Oral  SpO2: 98% 93% 93% 95%  Weight:  94.1 kg    Height:        GEN- The patient is well appearing, alert and oriented x 3 today.   HEENT: normocephalic, atraumatic; sclera clear, conjunctiva pink; hearing intact; oropharynx clear; neck supple Lungs- Clear to ausculation bilaterally, normal work of breathing.  No wheezes, rales, rhonchi Heart- irregular rate and rhythm, no murmurs, rubs or gallops GI- soft, non-tender, non-distended, bowel sounds present Extremities- no clubbing, cyanosis, or edema; DP/PT/radial pulses 2+ bilaterally MS- no significant deformity or atrophy Skin- warm and dry, no rash or lesion Psych- euthymic mood, full affect Neuro- strength and sensation are intact  Labs:   Lab Results  Component Value Date   WBC 2.4 (L) 08/06/2018   HGB 11.2 (L) 08/06/2018   HCT 36.4 (L) 08/06/2018   MCV 86.9 08/06/2018   PLT 48 (L) 08/06/2018    Recent Labs  Lab 08/05/18 1839 08/06/18 0617  NA 141 141  K 3.0* 3.6  CL 109 108  CO2 26 23  BUN 14 17  CREATININE 1.36* 1.33*  CALCIUM  9.0 9.2  PROT 6.2*  --   BILITOT 1.1  --   ALKPHOS 41  --   ALT 14  --   AST 16  --   GLUCOSE 102* 111*      Radiology/Studies: Dg Chest Port 1 View  Result Date: 08/05/2018 CLINICAL DATA:  Tachycardia, shortness of breath, AFib EXAM: PORTABLE CHEST 1 VIEW COMPARISON:  08/03/2017 FINDINGS: Mild left perihilar opacity. Mild right infrahilar opacity/atelectasis with associated eventration of the right hemidiaphragm. Trace left pleural effusion. Cardiomegaly.  Postsurgical changes related to prior CABG. Median sternotomy. IMPRESSION: Cardiomegaly with suspected mild perihilar edema. Mild right infrahilar opacity/atelectasis with associated eventration of the right hemidiaphragm. Trace left pleural effusion. Electronically Signed   By: Julian Hy M.D.   On: 08/05/2018 19:04    ONG:EXBM, LAHB (personally reviewed)  TELEMETRY: rate controlled afib with short episodes of sustained VT (personally reviewed)   Assessment/Plan: 1.  Ventricular tachycardia RBB L superior axis VT noted on ekg Episodes have improved overnight with amiodarone. EF previously 50-55% I will increase coreg to 12.40m BID at this time.  Continue to titrate as tolerated I agree with Dr Sallyanne Kuster that cath is indicated. Will plan to treat medically and avoid ICD as long as EF remains > 40% (repeat echo ordered) No driving until adequately controlled (Dr [name] to determine as outpatient)  2.  CAD s/p CABG Cath for further evaluation of VT once INR appropriate  3. Persistent afib Rate controlled Hold coumadin for cath  chadscvasc score is at least 3.  Heparin once INR < 2 Caution with low platelets!  4. HTN Stable No change required today  Cardiology to manage EP to reassess post cath  For questions or updates, please contact [place] Please consult [email or web address] for contact info under Cardiology/STEMI.  Army Fossa MD [facility] 08/06/2018 10:49 AM

## 2018-08-06 NOTE — Progress Notes (Signed)
Progress Note  Patient Name: [name] Date of Encounter: 08/06/2018  Primary Cardiologist: [name]  Subjective   Not much sleep overnight due to delay in meds: tired but denies dyspnea or angina. Had sustained VT 180 bpm, monomorphic, max 10 minutes last evening, but none since midnight. Has dyspnea, lightheadedness and chest tightness during VT. On amio IV, no VT.  Inpatient Medications    Scheduled Meds: . allopurinol  100 mg Oral Daily  . aspirin EC  81 mg Oral Daily  . atorvastatin  10 mg Oral Daily  . calcitRIOL  0.25 mcg Oral QODAY  . carvedilol  6.25 mg Oral BID WC  . DULoxetine  60 mg Oral QHS  . ferrous sulfate  325 mg Oral Q breakfast  . furosemide  40 mg Oral Daily  . omega-3 acid ethyl esters  2 g Oral BID  . potassium chloride  40 mEq Oral Daily  . predniSONE  5 mg Oral Q breakfast  . QUEtiapine  100 mg Oral QHS  . tacrolimus  0.5 mg Oral BID  . traZODone  100 mg Oral QHS   Continuous Infusions: . sodium chloride 10 mL/hr at 08/06/18 0830  . amiodarone    . cefTRIAXone (ROCEPHIN)  IV 2 g (08/05/18 2158)   PRN Meds: sodium chloride, acetaminophen, nitroGLYCERIN, ondansetron (ZOFRAN) IV   Vital Signs    Vitals:   08/06/18 0100 08/06/18 0300 08/06/18 0830 08/06/18 0847  BP: 115/71 104/75 98/66 98/66   Pulse: 88 84 76 78  Resp: (!) 28 16 (!) 26 17  Temp:  97.7 F (36.5 C)  97.7 F (36.5 C)  TempSrc:  Oral  Oral  SpO2: 98% 93% 93% 95%  Weight:  94.1 kg    Height:        Intake/Output Summary (Last 24 hours) at 08/06/2018 1024 Last data filed at 08/06/2018 0830 Gross per 24 hour  Intake 1220.29 ml  Output 1 ml  Net 1219.29 ml   Filed Weights   08/05/18 1800 08/06/18 0300  Weight: 92.6 kg 94.1 kg    Telemetry    AFib, controlled rate. Sustained monomorphic VT lasting up to 10 minutes last nigh - Personally Reviewed  ECG    AFib, PVCs, nonspecific IVCD QRS 114 ms, possible old inferior MI, PRWP, QTc 462 ms - Personally Reviewed   ECG in VT w RBBB pattern V1, left ward axis, positive precordial concordance  Physical Exam  Looks comfortable GEN: No acute distress.   Neck: No JVD, but prominent v waves Cardiac: irrregular, no murmurs, rubs, or gallops.  Respiratory: Clear to auscultation bilaterally. GI: Soft, nontender, non-distended  MS: No edema; No deformity. Neuro:  Nonfocal  Psych: Normal affect   Labs    Chemistry Recent Labs  Lab 08/05/18 1839 08/06/18 0617  NA 141 141  K 3.0* 3.6  CL 109 108  CO2 26 23  GLUCOSE 102* 111*  BUN 14 17  CREATININE 1.36* 1.33*  CALCIUM 9.0 9.2  PROT 6.2*  --   ALBUMIN 3.2*  --   AST 16  --   ALT 14  --   ALKPHOS 41  --   BILITOT 1.1  --   GFRNONAA 53* 55*  GFRAA >60 >60  ANIONGAP 6 10     Hematology Recent Labs  Lab 08/05/18 1839 08/06/18 0617  WBC 3.6* 2.4*  RBC 4.43 4.19*  HGB 11.9* 11.2*  HCT 37.7* 36.4*  MCV 85.1 86.9  MCH 26.9 26.7  MCHC 31.6 30.8  RDW 15.8* 15.7*  PLT CONSISTENT WITH PREVIOUS RESULT 48*    Cardiac Enzymes Recent Labs  Lab 08/05/18 1839 08/06/18 0008 08/06/18 0617  TROPONINI 0.03* 0.04* 0.03*   No results for input(s): TROPIPOC in the last 168 hours.   BNP Recent Labs  Lab 08/05/18 1839  BNP 503.5*     DDimer No results for input(s): DDIMER in the last 168 hours.   Radiology    Dg Chest Port 1 View  Result Date: 08/05/2018 CLINICAL DATA:  Tachycardia, shortness of breath, AFib EXAM: PORTABLE CHEST 1 VIEW COMPARISON:  08/03/2017 FINDINGS: Mild left perihilar opacity. Mild right infrahilar opacity/atelectasis with associated eventration of the right hemidiaphragm. Trace left pleural effusion. Cardiomegaly.  Postsurgical changes related to prior CABG. Median sternotomy. IMPRESSION: Cardiomegaly with suspected mild perihilar edema. Mild right infrahilar opacity/atelectasis with associated eventration of the right hemidiaphragm. Trace left pleural effusion. Electronically Signed   By: Julian Hy M.D.   On:  08/05/2018 19:04    Cardiac Studies   Echo 04/24/2018 - Left ventricle: The cavity size was mildly dilated. Wall   thickness was increased in a pattern of moderate LVH. Systolic   function was normal. The estimated ejection fraction was in the   range of 50% to 55%. There is hypokinesis of the   basalinferolateral myocardium. Doppler parameters are consistent   with high ventricular filling pressure. - Aortic valve: There was trivial regurgitation. - Mitral valve: Calcified annulus. There was mild regurgitation. - Left atrium: The atrium was moderately dilated. - Right ventricle: The cavity size was mildly dilated. - Right atrium: The atrium was mildly dilated. - Pulmonary arteries: Systolic pressure was mildly to moderately   increased. PA peak pressure: 48 mm Hg (S).  Impressions:  - Hypokinesis of the basal inferolateral wall with overall low   normal LV function; elevated LV filling pressure; moderate LVH;   mild LVE; trace AI; mild MR; biatrial enlargement; mild RVE; mild   TR with mild to moderate pulmonary hypertension.  Patient Profile     67 y.o. male with CAD (CABG 1992; occluded native vessels, patent grafts x 4 last cath 2009), s/p liver and kidney transplant, long term persistent/permanent atrial fibrillation, admitted to [facility] with Klebsiella pn sepsis, improved, but with recurrent episodes of symptomatic sustained monomorphic VT.  Assessment & Plan    1. 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 Good response to amio so far. EP evaluation requested. Note K was 3.0 yesterday, now improved. 2. CAD s/p CABG:  Although VT is monomorphic, it associates angina and is a recent development. Needs coronary angio, possibly tomorrow, more likely Tuesday due to warfarin. Usually not on ASA due anticoagulation for AFib. 3. CKD s.p renal transplant: creat 1.33, acceptable nephrotoxicity risk I believe. 4. ESLD s/p liver transplant:   Autoimmune hepatitis? On tacrolimus, prednisone. 5. Immunosuppressed status: recent GNR sepsis, source uncertain but probably pneumonia, improved. 6. Chronic steroid therapy:  Stress hydrocortisone necessary if he has an acute illness. 7. AFib: permanent. Hold anticoagulation for cath. Start heparin when INR<2. 8. Moderate thrombocytopenia:  Caution with antiplatelet agents, but he has not had bleeding issues and risk is not prohibitive.  For questions or updates, please contact [place] Please consult [email or web address] for contact info under        Signed, Sanda Klein, MD  08/06/2018, 10:24 AM

## 2018-08-06 NOTE — Progress Notes (Signed)
Pt states "feels like he holding fluid" typically takes 80 mg of lasix at home, has had 40 mg today. Paged on-call provider, no new orders at this time. Will monitor.

## 2018-08-06 NOTE — Progress Notes (Signed)
[place] for Heparin when INR<2 Indication: atrial fibrillation  Allergies  Allergen Reactions  . Grapefruit Concentrate Other (See Comments)    Pt has been told not to eat grapefruit  . Haloperidol Other (See Comments)    Too sedated  . Nsaids Other (See Comments)    Cannot take due to liver transplant  . Rifampin Other (See Comments)    Caused flushing  . Triazolam Other (See Comments)    Too sedated    Patient Measurements: Height: 5\' 10"  (177.8 cm) Weight: 207 lb 7.3 oz (94.1 kg) IBW/kg (Calculated) : 73 Heparin Dosing Weight: 91 kg  Vital Signs: Temp: 97.7 F (36.5 C) (09/29 0300) Temp Source: Oral (09/29 0300) BP: 104/75 (09/29 0300) Pulse Rate: 84 (09/29 0300)  Labs: Recent Labs    08/05/18 1839 08/06/18 0008 08/06/18 0617  HGB 11.9*  --  11.2*  HCT 37.7*  --  36.4*  PLT CONSISTENT WITH PREVIOUS RESULT  --  48*  APTT 54*  --   --   LABPROT 28.6*  --  24.3*  INR 2.72  --  2.21  CREATININE 1.36*  --   --   TROPONINI 0.03* 0.04*  --     Estimated Creatinine Clearance: 62.3 mL/min (A) (by C-G formula based on SCr of 1.36 mg/dL (H)).   Medical History: Past Medical History:  Diagnosis Date  . Atrial fibrillation ([place]) 08/2009  . CAD (coronary artery disease)    s/p CABG -- 1992  . ESRD (end stage renal disease) ([place])   . HLD (hyperlipidemia)   . HTN (hypertension)   . Idiopathic thrombocytopenia ([facility])     Assessment: [name] on warfarin PTA for hx Afib who presented on 9/28 with Afib with RVR. Pharmacy consulted for heparin dosing.   Admit INR is therapeutic (INR 2.72, goal of 2-3).  Discussed with MD on admission and will plan to hold warfarin and start Heparin when INR<2. INR still elevated at 2.2 this morning, will continue to hold.   Goal of Therapy:  Heparin level 0.3-0.7 units/ml Monitor platelets by anticoagulation protocol: Yes   Plan:  - No heparin for now - Will plan to start when INR<2 -  Will continue to monitor for any signs/symptoms of bleeding and will follow up with PT/INR in the a.m.   Thank you for allowing pharmacy to be a part of this patient's care.  Erin Hearing PharmD., BCPS Clinical Pharmacist 08/06/2018 7:48 AM

## 2018-08-07 ENCOUNTER — Encounter (HOSPITAL_COMMUNITY)
Admission: AD | Disposition: A | Discharge status: Home / Self Care | Payer: Self-pay | Attending: Cardiovascular Disease

## 2018-08-07 DIAGNOSIS — I2510 Atherosclerotic heart disease of native coronary artery without angina pectoris: Secondary | ICD-10-CM

## 2018-08-07 DIAGNOSIS — I5022 Chronic systolic (congestive) heart failure: Secondary | ICD-10-CM

## 2018-08-07 HISTORY — PX: LEFT HEART CATH AND CORONARY ANGIOGRAPHY: CATH118249

## 2018-08-07 LAB — PROTIME-INR
INR: 1.95
Prothrombin Time: 22 seconds — ABNORMAL HIGH (ref 11.4–15.2)

## 2018-08-07 LAB — ECHOCARDIOGRAM COMPLETE
Height: 70 in
Weight: 3319.25 oz

## 2018-08-07 SURGERY — LEFT HEART CATH AND CORONARY ANGIOGRAPHY
Anesthesia: LOCAL

## 2018-08-07 MED ORDER — LIDOCAINE HCL (PF) 1 % IJ SOLN
INTRAMUSCULAR | Status: AC
  Filled 2018-08-07: qty 30

## 2018-08-07 MED ORDER — HEPARIN (PORCINE) IN NACL 1000-0.9 UT/500ML-% IV SOLN
INTRAVENOUS | Status: AC
  Filled 2018-08-07: qty 500

## 2018-08-07 MED ORDER — SODIUM CHLORIDE 0.9% FLUSH
3.0000 mL | Freq: Two times a day (BID) | INTRAVENOUS | Status: DC
  Administered 2018-08-07 – 2018-08-09 (×3): 3 mL via INTRAVENOUS

## 2018-08-07 MED ORDER — VERAPAMIL HCL 2.5 MG/ML IV SOLN
INTRAVENOUS | Status: AC
  Filled 2018-08-07: qty 2

## 2018-08-07 MED ORDER — SODIUM CHLORIDE 0.9% FLUSH: 3.0000 mL | INTRAVENOUS | Status: DC | PRN

## 2018-08-07 MED ORDER — LIDOCAINE HCL (PF) 1 % IJ SOLN
INTRAMUSCULAR | Status: DC | PRN
  Administered 2018-08-07: 15 mL

## 2018-08-07 MED ORDER — MIDAZOLAM HCL 2 MG/2ML IJ SOLN
INTRAMUSCULAR | Status: AC
  Filled 2018-08-07: qty 2

## 2018-08-07 MED ORDER — MIDAZOLAM HCL 2 MG/2ML IJ SOLN
INTRAMUSCULAR | Status: DC | PRN
  Administered 2018-08-07: 1 mg via INTRAVENOUS

## 2018-08-07 MED ORDER — HEPARIN (PORCINE) IN NACL 1000-0.9 UT/500ML-% IV SOLN
INTRAVENOUS | Status: DC | PRN
  Administered 2018-08-07 (×2): 500 mL

## 2018-08-07 MED ORDER — IOHEXOL 350 MG/ML SOLN
INTRAVENOUS | Status: DC | PRN
  Administered 2018-08-07: 130 mL via INTRACARDIAC

## 2018-08-07 MED ORDER — SODIUM CHLORIDE 0.9 % IV SOLN: 250.0000 mL | INTRAVENOUS | Status: DC | PRN

## 2018-08-07 MED ORDER — FUROSEMIDE 10 MG/ML IJ SOLN
INTRAMUSCULAR | Status: DC | PRN
  Administered 2018-08-07: 20 mg via INTRAVENOUS

## 2018-08-07 MED ORDER — HEPARIN (PORCINE) IN NACL 100-0.45 UNIT/ML-% IJ SOLN
1250.0000 [IU]/h | INTRAMUSCULAR | Status: DC
  Administered 2018-08-07: 1250 [IU]/h via INTRAVENOUS
  Filled 2018-08-07: qty 250

## 2018-08-07 SURGICAL SUPPLY — 16 items
CATH INFINITI 5FR MULTPACK ANG (CATHETERS) ×2 IMPLANT
COVER SWIFTLINK CONNECTOR (BAG) ×2 IMPLANT
DEVICE CLOSURE PERCLS PRGLD 6F (VASCULAR PRODUCTS) ×1 IMPLANT
GLIDESHEATH SLEND SS 6F .021 (SHEATH) ×2 IMPLANT
GUIDEWIRE INQWIRE 1.5J.035X260 (WIRE) ×1 IMPLANT
INQWIRE 1.5J .035X260CM (WIRE) ×2
KIT HEART LEFT (KITS) ×2 IMPLANT
KIT MICROPUNCTURE NIT STIFF (SHEATH) ×2 IMPLANT
PACK CARDIAC CATHETERIZATION (CUSTOM PROCEDURE TRAY) ×2 IMPLANT
PERCLOSE PROGLIDE 6F (VASCULAR PRODUCTS) ×2
SHEATH PINNACLE 5F 10CM (SHEATH) ×2 IMPLANT
SHEATH PROBE COVER 6X72 (BAG) ×2 IMPLANT
SYR MEDRAD MARK V 150ML (SYRINGE) ×2 IMPLANT
TRANSDUCER W/STOPCOCK (MISCELLANEOUS) ×2 IMPLANT
TUBING CIL FLEX 10 FLL-RA (TUBING) ×2 IMPLANT
WIRE EMERALD 3MM-J .035X150CM (WIRE) ×2 IMPLANT

## 2018-08-07 NOTE — Progress Notes (Addendum)
Progress Note  Patient Name: [name] Date of Encounter: 08/07/2018  Primary Cardiologist: No primary care provider on file.   Subjective   No chest pain or palpitations last night.   Inpatient Medications    Scheduled Meds: . allopurinol  100 mg Oral Daily  . aspirin EC  81 mg Oral Daily  . atorvastatin  10 mg Oral Daily  . carvedilol  12.5 mg Oral BID WC  . DULoxetine  60 mg Oral QHS  . ferrous sulfate  325 mg Oral Q breakfast  . furosemide  40 mg Oral Daily  . omega-3 acid ethyl esters  2 g Oral BID  . potassium chloride  40 mEq Oral Daily  . predniSONE  5 mg Oral Q breakfast  . QUEtiapine  100 mg Oral QHS  . sodium chloride flush  3 mL Intravenous Q12H  . tacrolimus  0.5 mg Oral BID  . traZODone  100 mg Oral QHS   Continuous Infusions: . sodium chloride Stopped (08/06/18 2130)  . sodium chloride    . sodium chloride 1 mL/kg/hr (08/07/18 0752)  . amiodarone 30 mg/hr (08/07/18 0639)  . cefTRIAXone (ROCEPHIN)  IV Stopped (08/06/18 2200)  . heparin 1,250 Units/hr (08/07/18 0750)   PRN Meds: sodium chloride, sodium chloride, acetaminophen, nitroGLYCERIN, ondansetron (ZOFRAN) IV, sodium chloride flush   Vital Signs    Vitals:   08/06/18 2246 08/06/18 2300 08/07/18 0300 08/07/18 0741  BP:  94/67 (!) 139/92 119/85  Pulse:  77 95 89  Resp:  (!) 26 (!) 22 (!) 30  Temp:  97.7 F (36.5 C) 97.6 F (36.4 C) 98 F (36.7 C)  TempSrc:  Oral Oral Oral  SpO2:  100% 96% 98%  Weight: 94.1 kg  93.4 kg   Height:        Intake/Output Summary (Last 24 hours) at 08/07/2018 0943 Last data filed at 08/07/2018 7209 Gross per 24 hour  Intake 1328.8 ml  Output 350 ml  Net 978.8 ml   Filed Weights   08/06/18 0300 08/06/18 2246 08/07/18 0300  Weight: 94.1 kg 94.1 kg 93.4 kg    Telemetry    NSR - Personally Reviewed  ECG    AFib , rate controlled- Personally Reviewed  Physical Exam   GEN: No acute distress.  Lying flat Neck: No JVD Cardiac: irregularly  irregualr, no murmurs, rubs, or gallops.  Respiratory: Clear to auscultation bilaterally. GI: Soft, nontender, non-distended  MS: No edema; No deformity. Neuro:  Nonfocal  Psych: Normal affect   Labs    Chemistry Recent Labs  Lab 08/05/18 1839 08/06/18 0617  NA 141 141  K 3.0* 3.6  CL 109 108  CO2 26 23  GLUCOSE 102* 111*  BUN 14 17  CREATININE 1.36* 1.33*  CALCIUM 9.0 9.2  PROT 6.2*  --   ALBUMIN 3.2*  --   AST 16  --   ALT 14  --   ALKPHOS 41  --   BILITOT 1.1  --   GFRNONAA 53* 55*  GFRAA >60 >60  ANIONGAP 6 10     Hematology Recent Labs  Lab 08/05/18 1839 08/06/18 0617  WBC 3.6* 2.4*  RBC 4.43 4.19*  HGB 11.9* 11.2*  HCT 37.7* 36.4*  MCV 85.1 86.9  MCH 26.9 26.7  MCHC 31.6 30.8  RDW 15.8* 15.7*  PLT CONSISTENT WITH PREVIOUS RESULT 48*    Cardiac Enzymes Recent Labs  Lab 08/05/18 1839 08/06/18 0008 08/06/18 0617  TROPONINI 0.03* 0.04* 0.03*  No results for input(s): TROPIPOC in the last 168 hours.   BNP Recent Labs  Lab 08/05/18 1839  BNP 503.5*     DDimer No results for input(s): DDIMER in the last 168 hours.   Radiology    Dg Chest Port 1 View  Result Date: 08/05/2018 CLINICAL DATA:  Tachycardia, shortness of breath, AFib EXAM: PORTABLE CHEST 1 VIEW COMPARISON:  08/03/2017 FINDINGS: Mild left perihilar opacity. Mild right infrahilar opacity/atelectasis with associated eventration of the right hemidiaphragm. Trace left pleural effusion. Cardiomegaly.  Postsurgical changes related to prior CABG. Median sternotomy. IMPRESSION: Cardiomegaly with suspected mild perihilar edema. Mild right infrahilar opacity/atelectasis with associated eventration of the right hemidiaphragm. Trace left pleural effusion. Electronically Signed   By: Julian Hy M.D.   On: 08/05/2018 19:04    Cardiac Studies   Prior ECG reviewed  Patient Profile     67 y.o. male with NSVT  Assessment & Plan    1) Plan for cath.  All questions answered.  Looking  for ischemic cause of VT.  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 Low WBC.  Low platelets. THey have been consistently low over the last few years. Will have to limit antipletlet therapy if stent needed.  iLikely no aspirin, plavix only, especially in the setting of warfarin.   3) Chronic systolic heart failure: Appears euvolemic.  For questions or updates, please contact [place] Please consult [email or web address] for contact info under        Signed, Larae Grooms, MD  08/07/2018, 9:43 AM

## 2018-08-07 NOTE — Progress Notes (Addendum)
[name] for Heparin when INR<2 Indication: atrial fibrillation  Allergies  Allergen Reactions  . Other Anaphylaxis    Spider venom  . Grapefruit Concentrate Other (See Comments)    Pt has been told not to eat grapefruit  . Haloperidol Other (See Comments)    Too sedated  . Nsaids Other (See Comments)    Cannot take due to liver transplant  . Rifampin Other (See Comments)    Caused flushing  . Triazolam Other (See Comments)    Too sedated    Patient Measurements: Height: 5\' 10"  (177.8 cm) Weight: 205 lb 14.6 oz (93.4 kg) IBW/kg (Calculated) : 73 Heparin Dosing Weight: 91 kg  Vital Signs: Temp: 97.6 F (36.4 C) (09/30 0300) Temp Source: Oral (09/30 0300) BP: 139/92 (09/30 0300) Pulse Rate: 95 (09/30 0300)  Labs: Recent Labs    08/05/18 1839 08/06/18 0008 08/06/18 0617 08/07/18 0215  HGB 11.9*  --  11.2*  --   HCT 37.7*  --  36.4*  --   PLT CONSISTENT WITH PREVIOUS RESULT  --  48*  --   APTT 54*  --   --   --   LABPROT 28.6*  --  24.3* 22.0*  INR 2.72  --  2.21 1.95  CREATININE 1.36*  --  1.33*  --   TROPONINI 0.03* 0.04* 0.03*  --     Estimated Creatinine Clearance: 63.6 mL/min (A) (by C-G formula based on SCr of 1.33 mg/dL (H)).   Medical History: Past Medical History:  Diagnosis Date  . Atrial fibrillation ([place]) 08/2009  . CAD (coronary artery disease)    s/p CABG -- 1992  . ESRD (end stage renal disease) ([place])   . HLD (hyperlipidemia)   . HTN (hypertension)   . Idiopathic thrombocytopenia ([facility])     Assessment: [name] on warfarin PTA for hx Afib who presented on 9/28 with Afib with RVR. Pharmacy consulted for heparin dosing.   Admit INR is therapeutic (INR 2.72, goal of 2-3).  Discussed with MD on admission and will plan to hold warfarin and start heparin when INR<2. INR today decreased to 1.95. No s/sx of bleeding per nursing. Hgb 11.2, plt 28 on last check on 9/29 - CBC ordered daily on heparin  infusion.  Goal of Therapy:  Heparin level 0.3-0.7 units/ml Monitor platelets by anticoagulation protocol: Yes   Plan:  - Continue to hold warfarin - Start heparin infusion at 1250 units/hr  - Monitor heparin level in 6 hours and daily  - Will continue to monitor for any signs/symptoms of bleeding  Thank you for allowing pharmacy to be a part of this patient's care.  Doylene Canard, PharmD Clinical Pharmacist  Pager: [phone] Phone: [phone] 08/07/2018 7:16 AM

## 2018-08-07 NOTE — H&P (View-Only) (Signed)
Progress Note  Patient Name: [name] Date of Encounter: 08/07/2018  Primary Cardiologist: No primary care provider on file.   Subjective   No chest pain or palpitations last night.   Inpatient Medications    Scheduled Meds: . allopurinol  100 mg Oral Daily  . aspirin EC  81 mg Oral Daily  . atorvastatin  10 mg Oral Daily  . carvedilol  12.5 mg Oral BID WC  . DULoxetine  60 mg Oral QHS  . ferrous sulfate  325 mg Oral Q breakfast  . furosemide  40 mg Oral Daily  . omega-3 acid ethyl esters  2 g Oral BID  . potassium chloride  40 mEq Oral Daily  . predniSONE  5 mg Oral Q breakfast  . QUEtiapine  100 mg Oral QHS  . sodium chloride flush  3 mL Intravenous Q12H  . tacrolimus  0.5 mg Oral BID  . traZODone  100 mg Oral QHS   Continuous Infusions: . sodium chloride Stopped (08/06/18 2130)  . sodium chloride    . sodium chloride 1 mL/kg/hr (08/07/18 0752)  . amiodarone 30 mg/hr (08/07/18 0639)  . cefTRIAXone (ROCEPHIN)  IV Stopped (08/06/18 2200)  . heparin 1,250 Units/hr (08/07/18 0750)   PRN Meds: sodium chloride, sodium chloride, acetaminophen, nitroGLYCERIN, ondansetron (ZOFRAN) IV, sodium chloride flush   Vital Signs    Vitals:   08/06/18 2246 08/06/18 2300 08/07/18 0300 08/07/18 0741  BP:  94/67 (!) 139/92 119/85  Pulse:  77 95 89  Resp:  (!) 26 (!) 22 (!) 30  Temp:  97.7 F (36.5 C) 97.6 F (36.4 C) 98 F (36.7 C)  TempSrc:  Oral Oral Oral  SpO2:  100% 96% 98%  Weight: 94.1 kg  93.4 kg   Height:        Intake/Output Summary (Last 24 hours) at 08/07/2018 0943 Last data filed at 08/07/2018 6734 Gross per 24 hour  Intake 1328.8 ml  Output 350 ml  Net 978.8 ml   Filed Weights   08/06/18 0300 08/06/18 2246 08/07/18 0300  Weight: 94.1 kg 94.1 kg 93.4 kg    Telemetry    NSR - Personally Reviewed  ECG    AFib , rate controlled- Personally Reviewed  Physical Exam   GEN: No acute distress.  Lying flat Neck: No JVD Cardiac: irregularly  irregualr, no murmurs, rubs, or gallops.  Respiratory: Clear to auscultation bilaterally. GI: Soft, nontender, non-distended  MS: No edema; No deformity. Neuro:  Nonfocal  Psych: Normal affect   Labs    Chemistry Recent Labs  Lab 08/05/18 1839 08/06/18 0617  NA 141 141  K 3.0* 3.6  CL 109 108  CO2 26 23  GLUCOSE 102* 111*  BUN 14 17  CREATININE 1.36* 1.33*  CALCIUM 9.0 9.2  PROT 6.2*  --   ALBUMIN 3.2*  --   AST 16  --   ALT 14  --   ALKPHOS 41  --   BILITOT 1.1  --   GFRNONAA 53* 55*  GFRAA >60 >60  ANIONGAP 6 10     Hematology Recent Labs  Lab 08/05/18 1839 08/06/18 0617  WBC 3.6* 2.4*  RBC 4.43 4.19*  HGB 11.9* 11.2*  HCT 37.7* 36.4*  MCV 85.1 86.9  MCH 26.9 26.7  MCHC 31.6 30.8  RDW 15.8* 15.7*  PLT CONSISTENT WITH PREVIOUS RESULT 48*    Cardiac Enzymes Recent Labs  Lab 08/05/18 1839 08/06/18 0008 08/06/18 0617  TROPONINI 0.03* 0.04* 0.03*  No results for input(s): TROPIPOC in the last 168 hours.   BNP Recent Labs  Lab 08/05/18 1839  BNP 503.5*     DDimer No results for input(s): DDIMER in the last 168 hours.   Radiology    Dg Chest Port 1 View  Result Date: 08/05/2018 CLINICAL DATA:  Tachycardia, shortness of breath, AFib EXAM: PORTABLE CHEST 1 VIEW COMPARISON:  08/03/2017 FINDINGS: Mild left perihilar opacity. Mild right infrahilar opacity/atelectasis with associated eventration of the right hemidiaphragm. Trace left pleural effusion. Cardiomegaly.  Postsurgical changes related to prior CABG. Median sternotomy. IMPRESSION: Cardiomegaly with suspected mild perihilar edema. Mild right infrahilar opacity/atelectasis with associated eventration of the right hemidiaphragm. Trace left pleural effusion. Electronically Signed   By: Julian Hy M.D.   On: 08/05/2018 19:04    Cardiac Studies   Prior ECG reviewed  Patient Profile     67 y.o. male with NSVT  Assessment & Plan    1) Plan for cath.  All questions answered.  Looking  for ischemic cause of VT.  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 Low WBC.  Low platelets. THey have been consistently low over the last few years. Will have to limit antipletlet therapy if stent needed.  iLikely no aspirin, plavix only, especially in the setting of warfarin.   3) Chronic systolic heart failure: Appears euvolemic.  For questions or updates, please contact [place] Please consult [email or web address] for contact info under        Signed, Larae Grooms, MD  08/07/2018, 9:43 AM

## 2018-08-07 NOTE — Interval H&P Note (Signed)
History and Physical Interval Note:  08/07/2018 3:34 PM  [name]  has presented today for surgery, with the diagnosis of VT  The various methods of treatment have been discussed with the patient and family. After consideration of risks, benefits and other options for treatment, the patient has consented to  Procedure(s): LEFT HEART CATH AND CORONARY ANGIOGRAPHY (N/A) as a surgical intervention .  The patient's history has been reviewed, patient examined, no change in status, stable for surgery.  I have reviewed the patient's chart and labs.  Questions were answered to the patient's satisfaction.    Discussed plan for cath with patient. Will use US guidance and plan on perclose of the arteriotomy to reduce bleeding risk. All questions answered.   Sherren Mocha

## 2018-08-08 ENCOUNTER — Encounter (HOSPITAL_COMMUNITY): Payer: Self-pay | Admitting: Cardiovascular Disease

## 2018-08-08 LAB — CBC
HEMATOCRIT: 38.6 % — AB (ref 39.0–52.0)
HEMOGLOBIN: 11.9 g/dL — AB (ref 13.0–17.0)
MCH: 26.6 pg (ref 26.0–34.0)
MCHC: 30.8 g/dL (ref 30.0–36.0)
MCV: 86.4 fL (ref 78.0–100.0)
Platelets: 57 10*3/uL — ABNORMAL LOW (ref 150–400)
RBC: 4.47 MIL/uL (ref 4.22–5.81)
RDW: 15.4 % (ref 11.5–15.5)
WBC: 4.6 10*3/uL (ref 4.0–10.5)

## 2018-08-08 LAB — BASIC METABOLIC PANEL
Anion gap: 7 (ref 5–15)
BUN: 11 mg/dL (ref 8–23)
CHLORIDE: 103 mmol/L (ref 98–111)
CO2: 28 mmol/L (ref 22–32)
Calcium: 9.5 mg/dL (ref 8.9–10.3)
Creatinine, Ser: 1.19 mg/dL (ref 0.61–1.24)
GFR calc Af Amer: >60 mL/min (ref >60)
GFR calc non Af Amer: >60 mL/min (ref >60)
GLUCOSE: 117 mg/dL — AB (ref 70–99)
Potassium: 3.7 mmol/L (ref 3.5–5.1)
Sodium: 138 mmol/L (ref 135–145)

## 2018-08-08 LAB — PROTIME-INR
INR: 1.62
Prothrombin Time: 19.1 seconds — ABNORMAL HIGH (ref 11.4–15.2)

## 2018-08-08 LAB — TACROLIMUS LEVEL
Tacrolimus (FK506) - LabCorp: 3.4 ng/mL (ref 2.0–20.0)
Tacrolimus (FK506) - LabCorp: 3.5 ng/mL (ref 2.0–20.0)

## 2018-08-08 MED ORDER — WARFARIN SODIUM 3 MG PO TABS
6.0000 mg | ORAL_TABLET | Freq: Once | ORAL | Status: AC
  Administered 2018-08-08: 6 mg via ORAL
  Filled 2018-08-08: qty 2

## 2018-08-08 MED ORDER — WARFARIN - PHARMACIST DOSING INPATIENT
Freq: Every day | Status: DC
  Administered 2018-08-09: 1

## 2018-08-08 MED ORDER — AMIODARONE HCL 200 MG PO TABS
200.0000 mg | ORAL_TABLET | Freq: Every day | ORAL | Status: DC
  Administered 2018-08-08 – 2018-08-09 (×2): 200 mg via ORAL
  Filled 2018-08-08 (×2): qty 1

## 2018-08-08 MED ORDER — FUROSEMIDE 10 MG/ML IJ SOLN
40.0000 mg | Freq: Once | INTRAMUSCULAR | Status: AC
  Administered 2018-08-08: 40 mg via INTRAVENOUS
  Filled 2018-08-08: qty 4

## 2018-08-08 MED ORDER — CARVEDILOL 25 MG PO TABS
25.0000 mg | ORAL_TABLET | Freq: Two times a day (BID) | ORAL | Status: DC
  Administered 2018-08-08 – 2018-08-09 (×3): 25 mg via ORAL
  Filled 2018-08-08 (×3): qty 1

## 2018-08-08 NOTE — Progress Notes (Addendum)
Progress Note  Patient Name: [name] Date of Encounter: 08/08/2018  Primary Cardiologist: Dr. [name]  Subjective   No CP, still with some SOB but improved.  Inpatient Medications    Scheduled Meds: . allopurinol  100 mg Oral Daily  . aspirin EC  81 mg Oral Daily  . atorvastatin  10 mg Oral Daily  . carvedilol  12.5 mg Oral BID WC  . DULoxetine  60 mg Oral QHS  . ferrous sulfate  325 mg Oral Q breakfast  . furosemide  40 mg Oral Daily  . omega-3 acid ethyl esters  2 g Oral BID  . potassium chloride  40 mEq Oral Daily  . predniSONE  5 mg Oral Q breakfast  . QUEtiapine  100 mg Oral QHS  . sodium chloride flush  3 mL Intravenous Q12H  . tacrolimus  0.5 mg Oral BID  . traZODone  100 mg Oral QHS   Continuous Infusions: . sodium chloride Stopped (08/06/18 2130)  . sodium chloride    . amiodarone 30 mg/hr (08/08/18 1019)  . cefTRIAXone (ROCEPHIN)  IV 2 g (08/07/18 2226)   PRN Meds: sodium chloride, sodium chloride, acetaminophen, nitroGLYCERIN, ondansetron (ZOFRAN) IV, sodium chloride flush   Vital Signs    Vitals:   08/07/18 1922 08/07/18 2313 08/08/18 0433 08/08/18 0749  BP: 137/87  117/76 (!) 116/102  Pulse: 64 82 85 87  Resp: (!) 25 (!) 23 (!) 26 19  Temp: 97.8 F (36.6 C) 98.6 F (37 C) 98.9 F (37.2 C) 98.6 F (37 C)  TempSrc: Oral Oral Oral Oral  SpO2: 98% 99% 96%   Weight:   93.8 kg   Height:        Intake/Output Summary (Last 24 hours) at 08/08/2018 1105 Last data filed at 08/08/2018 0900 Gross per 24 hour  Intake 2463.02 ml  Output 950 ml  Net 1513.02 ml   Filed Weights   08/06/18 2246 08/07/18 0300 08/08/18 0433  Weight: 94.1 kg 93.4 kg 93.8 kg    Telemetry    AFib, CVR, occ PVCs, no bradycardia - Personally Reviewed  ECG    No new EKGs - Personally Reviewed  Physical Exam   GEN: No acute distress.   Neck: No JVD Cardiac: irreg-irreg, soft SM, no rubs, or gallops.  Respiratory: some soft rales, crackles at bases. GI:  Soft, nontender, non-distended  MS: trace if any edema; No deformity. Neuro:  Nonfocal  Psych: Normal affect   Labs    Chemistry Recent Labs  Lab 08/05/18 1839 08/06/18 0617  NA 141 141  K 3.0* 3.6  CL 109 108  CO2 26 23  GLUCOSE 102* 111*  BUN 14 17  CREATININE 1.36* 1.33*  CALCIUM 9.0 9.2  PROT 6.2*  --   ALBUMIN 3.2*  --   AST 16  --   ALT 14  --   ALKPHOS 41  --   BILITOT 1.1  --   GFRNONAA 53* 55*  GFRAA >60 >60  ANIONGAP 6 10     Hematology Recent Labs  Lab 08/05/18 1839 08/06/18 0617 08/08/18 0238  WBC 3.6* 2.4* 4.6  RBC 4.43 4.19* 4.47  HGB 11.9* 11.2* 11.9*  HCT 37.7* 36.4* 38.6*  MCV 85.1 86.9 86.4  MCH 26.9 26.7 26.6  MCHC 31.6 30.8 30.8  RDW 15.8* 15.7* 15.4  PLT CONSISTENT WITH PREVIOUS RESULT 48* 57*    Cardiac Enzymes Recent Labs  Lab 08/05/18 1839 08/06/18 0008 08/06/18 0617  TROPONINI 0.03* 0.04* 0.03*  No results for input(s): TROPIPOC in the last 168 hours.   BNP Recent Labs  Lab 08/05/18 1839  BNP 503.5*     DDimer No results for input(s): DDIMER in the last 168 hours.   Radiology    No results found.  Cardiac Studies   08/07/18: LHC  Prox RCA lesion is 100% stenosed.  Acute Mrg lesion is 100% stenosed.  Ost Ramus lesion is 100% stenosed.  Ost Cx to Prox Cx lesion is 100% stenosed.  Ost LM to Mid LM lesion is 50% stenosed.  Origin to Prox Graft lesion is 100% stenosed.  Origin to Prox Graft lesion is 60% stenosed. 1. Severe native 3 vessel CAD with total occlusion of the LAD, LCx, and RCA 2. S/P CABG with continued patency of the LIMA-LAD, SVG-ramus, and SVG-PDA 3. Chronic occlusion of the SVG-OM1 4. Moderate stenosis of the SVG-ramus. Favor medical therapy considering comorbidities, thrombocytopenia, chronic anticoagulation 5. Elevated LVEDP  08/06/18: TTE Study Conclusions - Left ventricle: The cavity size was normal. Wall thickness was   increased in a pattern of moderate LVH. Systolic function was    moderately reduced. The estimated ejection fraction was in the   range of 35% to 40%. There is hypokinesis of the   mid-apicalinferolateral myocardium. There is akinesis of the   basalinferior myocardium. - Aortic valve: There was trivial regurgitation. - Mitral valve: Calcified annulus. There was mild regurgitation. - Left atrium: The atrium was severely dilated. - Tricuspid valve: There was moderate regurgitation. - Pulmonary arteries: Systolic pressure was moderately increased.   PA peak pressure: 53 mm Hg (S). Impressions: - Akinesis of the basal inferior wall; hypokinesis of the distal   inferolateral wall; overall moderate LV dysfunction (EF 40);   moderate LVH; trace AI; mild MR; severe LAE; moderate TR with   moderate pulmonary hypertension; possible small mass in right   atrium; suggest TEE or cardiac MRI to furterh assess.  Patient Profile     67 y.o. 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  Ventricular tachycardia RBB L superior axis VT noted on ekg  He is now s/p LHC noting significant CAD, no intervention was pursued. LVEF by TTE is 35-40% TTE mentions pos RA mass?  Discussed with Dr. Irish Lack, will review and decide on/if further imaging for this He is currently has DNR status  Remains on amiodarone gtt with coreg 12.5mg  BID  Immunocompromised given transplant patient Plts 57 (chronically low)  In d/w Dr. Rayann Heman.  Given LVEF 35-40%, no syncope with his VT, would not plan to pursue ICD at this juncture. Convert to PO amiodarone and further up-titrate coreg today.  He has not had any bradycardia, BP has room.   2. Persistent AFib     CHA2DS2Vasc is at least 3, on warfarin out patient, held here for procedures     Would be OK from our  standpoint to resume his oral a/c with no plans for implant at this time  3. Acute/chronic CHF     Still winded, but less     C/w primary cardiology team    For questions or updates, please contact [place] Please consult [email or web address] for contact info under        Signed, Baldwin [place], PA-C  08/08/2018, 11:05 AM  I have seen, examined the patient, and reviewed the above assessment and plan.  Changes to above are made where necessary.  On exam, iRRR.  VT is well controlled.  Convert amiodarone to 200mg  daily.  Increase coreg to 25mg  BID. Given his preference for conservative measures and lack of hemodynamic instability with is VT, medical therapy without ICD is reasonable. No driving until he follows up with Dr [name] in 4 weeks.  Electrophysiology team to see as needed while here. Please call with questions.  Co Sign: Thompson Grayer, MD 08/08/2018

## 2018-08-08 NOTE — Progress Notes (Signed)
[place] for Warfarin Indication: atrial fibrillation  Allergies  Allergen Reactions  . Other Anaphylaxis    Spider venom  . Grapefruit Concentrate Other (See Comments)    Pt has been told not to eat grapefruit  . Haloperidol Other (See Comments)    Too sedated  . Nsaids Other (See Comments)    Cannot take due to liver transplant  . Rifampin Other (See Comments)    Caused flushing  . Triazolam Other (See Comments)    Too sedated    Patient Measurements: Height: 5\' 10"  (177.8 cm) Weight: 206 lb 12.7 oz (93.8 kg) IBW/kg (Calculated) : 73 Heparin Dosing Weight: 91 kg  Vital Signs: Temp: 99 F (37.2 C) (10/01 1100) Temp Source: Oral (10/01 1100) BP: 121/67 (10/01 1200) Pulse Rate: 80 (10/01 1200)  Labs: Recent Labs    08/05/18 1839 08/06/18 0008 08/06/18 0617 08/07/18 0215 08/08/18 0238 08/08/18 1125  HGB 11.9*  --  11.2*  --  11.9*  --   HCT 37.7*  --  36.4*  --  38.6*  --   PLT CONSISTENT WITH PREVIOUS RESULT  --  48*  --  57*  --   APTT 54*  --   --   --   --   --   LABPROT 28.6*  --  24.3* 22.0* 19.1*  --   INR 2.72  --  2.21 1.95 1.62  --   CREATININE 1.36*  --  1.33*  --   --  1.19  TROPONINI 0.03* 0.04* 0.03*  --   --   --     Estimated Creatinine Clearance: 71.2 mL/min (by C-G formula based on SCr of 1.19 mg/dL).   Medical History: Past Medical History:  Diagnosis Date  . Atrial fibrillation ([place]) 08/2009  . CAD (coronary artery disease)    s/p CABG -- 1992  . ESRD (end stage renal disease) ([place])   . HLD (hyperlipidemia)   . HTN (hypertension)   . Idiopathic thrombocytopenia ([facility])     Assessment: [name] on warfarin PTA for hx Afib who presented on 9/28 with Afib with RVR. Pharmacy consulted for heparin dosing.   Admit INR is therapeutic (INR 2.72, goal of 2-3).  Patient s/p cath yesterday 9/30, severe native 3 vessel disease with continued graft patency. Warfarin was on hold for procedures then held  last night due to thrombocytopenia. Patient appears very stable on prior to admit warfarin dose of 4mg  daily except 6mg  on mondays. INR down to 1.6 this morning, will give higher than home dose tonight.   Goal of Therapy:  INR goal 2-3 Monitor platelets by anticoagulation protocol: Yes   Plan:  Warfarin 6mg  tonight Daily INR  Thank you for allowing pharmacy to be a part of this patient's care.  Erin Hearing PharmD., BCPS Clinical Pharmacist 08/08/2018 2:04 PM

## 2018-08-08 NOTE — Progress Notes (Addendum)
Progress Note  Patient Name: [name] Date of Encounter: 08/08/2018  Primary Cardiologist: No primary care provider on file.   Subjective   No chest pain or palpitations overnight. Some Shortness of breath.   Inpatient Medications    Scheduled Meds: . allopurinol  100 mg Oral Daily  . aspirin EC  81 mg Oral Daily  . atorvastatin  10 mg Oral Daily  . carvedilol  12.5 mg Oral BID WC  . DULoxetine  60 mg Oral QHS  . ferrous sulfate  325 mg Oral Q breakfast  . furosemide  40 mg Oral Daily  . omega-3 acid ethyl esters  2 g Oral BID  . potassium chloride  40 mEq Oral Daily  . predniSONE  5 mg Oral Q breakfast  . QUEtiapine  100 mg Oral QHS  . sodium chloride flush  3 mL Intravenous Q12H  . tacrolimus  0.5 mg Oral BID  . traZODone  100 mg Oral QHS   Continuous Infusions: . sodium chloride Stopped (08/06/18 2130)  . sodium chloride    . amiodarone 30 mg/hr (08/07/18 2304)  . cefTRIAXone (ROCEPHIN)  IV 2 g (08/07/18 2226)   PRN Meds: sodium chloride, sodium chloride, acetaminophen, nitroGLYCERIN, ondansetron (ZOFRAN) IV, sodium chloride flush   Vital Signs    Vitals:   08/07/18 1922 08/07/18 2313 08/08/18 0433 08/08/18 0749  BP: 137/87  117/76 (!) 116/102  Pulse: 64 82 85 87  Resp: (!) 25 (!) 23 (!) 26 19  Temp: 97.8 F (36.6 C) 98.6 F (37 C) 98.9 F (37.2 C) 98.6 F (37 C)  TempSrc: Oral Oral Oral Oral  SpO2: 98% 99% 96%   Weight:   93.8 kg   Height:        Intake/Output Summary (Last 24 hours) at 08/08/2018 0955 Last data filed at 08/08/2018 0900 Gross per 24 hour  Intake 2463.02 ml  Output 950 ml  Net 1513.02 ml   Filed Weights   08/06/18 2246 08/07/18 0300 08/08/18 0433  Weight: 94.1 kg 93.4 kg 93.8 kg    Telemetry    AFib , rate controlled - Personally Reviewed  ECG    No recent ECG  Physical Exam   GEN: No acute distress.  Lying flat on side Neck: No JVD Cardiac: irregularly irregualr, no murmurs, rubs, or gallops.    Respiratory: Mlid Bibasilar Rales GI: Soft, nontender, non-distended  MS: No edema; No deformity, R groin cath site clean and dry w/o hematoma Neuro:  Nonfocal  Psych: Normal affect   Labs    Chemistry Recent Labs  Lab 08/05/18 1839 08/06/18 0617  NA 141 141  K 3.0* 3.6  CL 109 108  CO2 26 23  GLUCOSE 102* 111*  BUN 14 17  CREATININE 1.36* 1.33*  CALCIUM 9.0 9.2  PROT 6.2*  --   ALBUMIN 3.2*  --   AST 16  --   ALT 14  --   ALKPHOS 41  --   BILITOT 1.1  --   GFRNONAA 53* 55*  GFRAA >60 >60  ANIONGAP 6 10     Hematology Recent Labs  Lab 08/05/18 1839 08/06/18 0617 08/08/18 0238  WBC 3.6* 2.4* 4.6  RBC 4.43 4.19* 4.47  HGB 11.9* 11.2* 11.9*  HCT 37.7* 36.4* 38.6*  MCV 85.1 86.9 86.4  MCH 26.9 26.7 26.6  MCHC 31.6 30.8 30.8  RDW 15.8* 15.7* 15.4  PLT CONSISTENT WITH PREVIOUS RESULT 48* 57*    Cardiac Enzymes Recent Labs  Lab  08/05/18 1839 08/06/18 0008 08/06/18 0617  TROPONINI 0.03* 0.04* 0.03*   No results for input(s): TROPIPOC in the last 168 hours.   BNP Recent Labs  Lab 08/05/18 1839  BNP 503.5*     DDimer No results for input(s): DDIMER in the last 168 hours.   Radiology    No results found.  Cardiac Studies   Cath 9/30: Oold LAD, LCx, and RCA occlution; Old occlution of OM1 graft; Mod Occlusion of Ramus Graft; No intervention  Patient Profile     67 y.o. male with NSVT  Assessment & Plan    1) NSVT: Cath performed showed old LAD, LCx, and RCA occlution; Old occlution of OM1 graft; Mod Occlusion of Ramus Graft. No significant ischemic cause of VT. Will continue with medical management. EP following, will defer ICD discussion (given reduced EF) to them. - IV Amiodarone - Coreg 12.5mg  BID  2) CAD: Cath on 9/30 w/ new mod Occlusion of Ramus Graf and old changes as above. Will continue with medical management, Coreg increased for NSVT as above.  3) HF: Previously HFpEF, EF reduced this admission to 35-40%. Will continue with  medications, coreg increased as above. Mild Rales on exam today, Wt stable, I/O show net positive (though accuracy unclear). Will give once time dose of IV Lasix this morning. Will wean O2 to room air as patient does not use oxygen at home. - IV Lasix 40mg  Once - BMP now and in AM  4) A. Fib: In A- Fib this morning. Rate controlled. Will need to resume Warfarin when off heparin.   5) CAP: On day 6/7 of Ceftriaxone, remains afebrile.  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 Low WBC.  Low platelets. Consistently low over the last few years.   For questions or updates, please contact [place] Please consult [email or web address] for contact info under     Signed, Neva Seat, MD  08/08/2018, 9:55 AM    I have examined the patient and reviewed assessment and plan and discussed with patient.  Agree with above as stated.  Reviewed echo images.  ?prominent crista terminalis.  He is aready anticoagulated.  Would not pursue any other imaging.  Groin site without hematoma.  Plan to restart Coumadin today.  Stop heparin when COumadin therapeutic.  Wean oxygen.  Larae Grooms

## 2018-08-09 DIAGNOSIS — J189 Pneumonia, unspecified organism: Secondary | ICD-10-CM

## 2018-08-09 DIAGNOSIS — I4821 Permanent atrial fibrillation: Secondary | ICD-10-CM

## 2018-08-09 LAB — BASIC METABOLIC PANEL
ANION GAP: 10 (ref 5–15)
BUN: 12 mg/dL (ref 8–23)
CALCIUM: 9.3 mg/dL (ref 8.9–10.3)
CO2: 28 mmol/L (ref 22–32)
CREATININE: 1.24 mg/dL (ref 0.61–1.24)
Chloride: 100 mmol/L (ref 98–111)
GFR calc non Af Amer: 59 mL/min — ABNORMAL LOW (ref >60)
GLUCOSE: 99 mg/dL (ref 70–99)
Potassium: 3.4 mmol/L — ABNORMAL LOW (ref 3.5–5.1)
SODIUM: 138 mmol/L (ref 135–145)

## 2018-08-09 LAB — CBC
HCT: 35.6 % — ABNORMAL LOW (ref 39.0–52.0)
Hemoglobin: 11.3 g/dL — ABNORMAL LOW (ref 13.0–17.0)
MCH: 27.4 pg (ref 26.0–34.0)
MCHC: 31.7 g/dL (ref 30.0–36.0)
MCV: 86.2 fL (ref 78.0–100.0)
PLATELETS: 70 10*3/uL — AB (ref 150–400)
RBC: 4.13 MIL/uL — ABNORMAL LOW (ref 4.22–5.81)
RDW: 15.7 % — AB (ref 11.5–15.5)
WBC: 4.5 10*3/uL (ref 4.0–10.5)

## 2018-08-09 LAB — PROTIME-INR
INR: 1.53
Prothrombin Time: 18.3 seconds — ABNORMAL HIGH (ref 11.4–15.2)

## 2018-08-09 MED ORDER — POTASSIUM CHLORIDE CRYS ER 20 MEQ PO TBCR: 40.0000 meq | EXTENDED_RELEASE_TABLET | Freq: Every day | ORAL | 1 refills | Status: DC

## 2018-08-09 MED ORDER — POTASSIUM CHLORIDE CRYS ER 20 MEQ PO TBCR
40.0000 meq | EXTENDED_RELEASE_TABLET | Freq: Once | ORAL | Status: AC
  Administered 2018-08-09: 40 meq via ORAL
  Filled 2018-08-09: qty 2

## 2018-08-09 MED ORDER — AMIODARONE HCL 200 MG PO TABS: 200.0000 mg | ORAL_TABLET | Freq: Every day | ORAL | 1 refills | Status: DC

## 2018-08-09 MED ORDER — CARVEDILOL 25 MG PO TABS: 25.0000 mg | ORAL_TABLET | Freq: Two times a day (BID) | ORAL | 2 refills | Status: DC

## 2018-08-09 MED ORDER — WARFARIN SODIUM 3 MG PO TABS
6.0000 mg | ORAL_TABLET | Freq: Once | ORAL | Status: AC
  Administered 2018-08-09: 6 mg via ORAL
  Filled 2018-08-09: qty 2

## 2018-08-09 NOTE — Care Management Important Message (Signed)
Important Message  Patient Details  Name: [name] MRN: [id number] Date of Birth: Jul 04, 1951   Medicare Important Message Given:  Yes    Barb Merino Kristeen Lantz 08/09/2018, 2:48 PM

## 2018-08-09 NOTE — Progress Notes (Signed)
Discussed discharge instructions and medications with patient. Patient verbalized understanding with all questions answered. VSS. [facility] home [facility] set up and walker delivered to patient. All discharge appointments given to pt. PIVs dc'ed. Pt waiting on brother for discharge home.   [facility]

## 2018-08-09 NOTE — Progress Notes (Signed)
[place] for Warfarin Indication: atrial fibrillation  Allergies  Allergen Reactions  . Other Anaphylaxis    Spider venom  . Grapefruit Concentrate Other (See Comments)    Pt has been told not to eat grapefruit  . Haloperidol Other (See Comments)    Too sedated  . Nsaids Other (See Comments)    Cannot take due to liver transplant  . Rifampin Other (See Comments)    Caused flushing  . Triazolam Other (See Comments)    Too sedated    Patient Measurements: Height: 5\' 10"  (177.8 cm) Weight: 202 lb 9.6 oz (91.9 kg) IBW/kg (Calculated) : 73 Heparin Dosing Weight: 91 kg  Vital Signs: Temp: 98 F (36.7 C) (10/02 0838) Temp Source: Oral (10/02 0838) BP: 128/68 (10/02 0838) Pulse Rate: 86 (10/02 0838)  Labs: Recent Labs    08/07/18 0215 08/08/18 0238 08/08/18 1125 08/09/18 0236  HGB  --  11.9*  --  11.3*  HCT  --  38.6*  --  35.6*  PLT  --  57*  --  70*  LABPROT 22.0* 19.1*  --  18.3*  INR 1.95 1.62  --  1.53  CREATININE  --   --  1.19 1.24    Estimated Creatinine Clearance: 67.7 mL/min (by C-G formula based on SCr of 1.24 mg/dL).   Medical History: Past Medical History:  Diagnosis Date  . Atrial fibrillation ([place]) 08/2009  . CAD (coronary artery disease)    s/p CABG -- 1992  . ESRD (end stage renal disease) ([place])   . HLD (hyperlipidemia)   . HTN (hypertension)   . Idiopathic thrombocytopenia ([facility])     Assessment: [name] on warfarin PTA for hx Afib who presented on 9/28 with Afib with RVR. Pharmacy consulted for heparin dosing.   Admit INR is therapeutic (INR 2.72, goal of 2-3).  Patient s/p cath yesterday 9/30, severe native 3 vessel disease with continued graft patency. Warfarin was on hold for procedures then held 9/30 due to thrombocytopenia. Patient appears very stable on prior to admit warfarin dose of 4mg  daily except 6mg  on mondays. INR down to 1.5 this morning, will continue higher than home dose tonight.    Pltc continues to improve to 70 this morning, hgb stable.   Goal of Therapy:  INR goal 2-3 Monitor platelets by anticoagulation protocol: Yes   Plan:  Warfarin 6mg  again tonight Daily INR  Thank you for allowing pharmacy to be a part of this patient's care.  Erin Hearing PharmD., BCPS Clinical Pharmacist 08/09/2018 11:15 AM

## 2018-08-09 NOTE — Care Management Note (Signed)
Case Management Note  Patient Details  Name: AMITAI DELAUGHTER MRN: [id number] Date of Birth: 07-01-1951  Subjective/Objective:     Pt admitted with VT.                 Action/Plan:   PTA independent from home.  Pt has PCP and denied barriers with obtaining/paying for medications.  Pt interested in HHPT and rollator - pt chose [facility] - agency contacted and referral accepted - agency informed that pt will discharge home today.   Expected Discharge Date:  08/09/18               Expected Discharge Plan:  Home/Self Care  In-House Referral:     Discharge planning Services  CM Consult  Post Acute Care Choice:    Choice offered to:     DME Arranged:  Walker rolling with seat DME Agency:  [place]:  PT [facility] Agency:  [place]  Status of Service:  Completed, signed off  If discussed at [place] of Stay Meetings, dates discussed:    Additional Comments:  Maryclare Labrador, RN 08/09/2018, 5:02 PM

## 2018-08-09 NOTE — Progress Notes (Addendum)
Progress Note  Patient Name: [name] Date of Encounter: 08/09/2018  Primary Cardiologist: No primary care provider on file.   Subjective   No chest pain or palpitation overnight. Mild shortness of breath this morning, states this is his baseline. Oxygen turned off during interview, saturating 88-95%.   Inpatient Medications    Scheduled Meds: . allopurinol  100 mg Oral Daily  . amiodarone  200 mg Oral Daily  . aspirin EC  81 mg Oral Daily  . atorvastatin  10 mg Oral Daily  . carvedilol  25 mg Oral BID WC  . DULoxetine  60 mg Oral QHS  . ferrous sulfate  325 mg Oral Q breakfast  . furosemide  40 mg Oral Daily  . omega-3 acid ethyl esters  2 g Oral BID  . potassium chloride  40 mEq Oral Daily  . predniSONE  5 mg Oral Q breakfast  . QUEtiapine  100 mg Oral QHS  . sodium chloride flush  3 mL Intravenous Q12H  . tacrolimus  0.5 mg Oral BID  . traZODone  100 mg Oral QHS  . Warfarin - Pharmacist Dosing Inpatient   Does not apply q1800   Continuous Infusions: . sodium chloride Stopped (08/06/18 2130)  . sodium chloride    . cefTRIAXone (ROCEPHIN)  IV Stopped (08/08/18 2223)   PRN Meds: sodium chloride, sodium chloride, acetaminophen, nitroGLYCERIN, ondansetron (ZOFRAN) IV, sodium chloride flush   Vital Signs    Vitals:   08/08/18 2000 08/08/18 2345 08/09/18 0540 08/09/18 0838  BP: 126/73 100/68 106/67 128/68  Pulse: 84 87 82 86  Resp: 19 (!) 25 (!) 24 (!) 27  Temp: 98.7 F (37.1 C) 98.6 F (37 C) 97.9 F (36.6 C) 98 F (36.7 C)  TempSrc: Oral Oral Oral Oral  SpO2: 97% 92% 93% 93%  Weight:   91.9 kg   Height:        Intake/Output Summary (Last 24 hours) at 08/09/2018 0927 Last data filed at 08/09/2018 0100 Gross per 24 hour  Intake 491.06 ml  Output 2525 ml  Net -2033.94 ml   Filed Weights   08/07/18 0300 08/08/18 0433 08/09/18 0540  Weight: 93.4 kg 93.8 kg 91.9 kg    Telemetry    AFib , rate controlled - Personally Reviewed  ECG    No  recent ECG  Physical Exam   GEN: No acute distress.  Lying flat on side Neck: No JVD Cardiac: irregularly irregualr, no murmurs, rubs, or gallops.  Respiratory: CTAB, no rales GI: Soft, nontender, non-distended  MS: No edema; No deformity, R groin cath site clean and dry w/o hematoma Neuro:  Nonfocal  Psych: Normal affect   Labs    Chemistry Recent Labs  Lab 08/05/18 1839 08/06/18 0617 08/08/18 1125 08/09/18 0236  NA 141 141 138 138  K 3.0* 3.6 3.7 3.4*  CL 109 108 103 100  CO2 26 23 28 28   GLUCOSE 102* 111* 117* 99  BUN 14 17 11 12   CREATININE 1.36* 1.33* 1.19 1.24  CALCIUM 9.0 9.2 9.5 9.3  PROT 6.2*  --   --   --   ALBUMIN 3.2*  --   --   --   AST 16  --   --   --   ALT 14  --   --   --   ALKPHOS 41  --   --   --   BILITOT 1.1  --   --   --   GFRNONAA 53*  55* >60 59*  GFRAA >60 >60 >60 >60  ANIONGAP 6 10 7 10      Hematology Recent Labs  Lab 08/06/18 0617 08/08/18 0238 08/09/18 0236  WBC 2.4* 4.6 4.5  RBC 4.19* 4.47 4.13*  HGB 11.2* 11.9* 11.3*  HCT 36.4* 38.6* 35.6*  MCV 86.9 86.4 86.2  MCH 26.7 26.6 27.4  MCHC 30.8 30.8 31.7  RDW 15.7* 15.4 15.7*  PLT 48* 57* 70*    Cardiac Enzymes Recent Labs  Lab 08/05/18 1839 08/06/18 0008 08/06/18 0617  TROPONINI 0.03* 0.04* 0.03*   No results for input(s): TROPIPOC in the last 168 hours.   BNP Recent Labs  Lab 08/05/18 1839  BNP 503.5*     DDimer No results for input(s): DDIMER in the last 168 hours.   Radiology    No results found.  Cardiac Studies   Cath 9/30: Old LAD, LCx, and RCA occlution; Old occlution of OM1 graft; Mod Occlusion of Ramus Graft; No intervention  Patient Profile     67 y.o. male with NSVT, no ischemic source on cath, now on Amio.  Assessment & Plan    1) NSVT: Cath performed showed old LAD, LCx, and RCA occlution; Old occlution of OM1 graft; Mod Occlusion of Ramus Graft. No significant ischemic cause of VT. Will continue with medical management. Remains in baseline  rate controlled A.Fib. Will have PT see patient for any recommendations, likely discharge today. - Transitioned to PO Amiodarone 200mg  Daily - Coreg increased to 25mg  BID  2) CAD: Cath on 9/30 w/ new moderate Occlusion of Ramus Graft and old changes as above. Will continue with medical management, Coreg increased for NSVT as above.  3) HF: Previously HFpEF, EF reduced this admission to 35-40%. Will continue current medications and increase coreg as above. Lungs clear today with dose of IV Lasix yesterday. Net -1.9L, Wt down ~1.5kg past 24 hrs. O2 turned of this morning with saturation remainin 88-95%, will monitor. K 3.4 this AM on 40 mEq daily, will give additional dose today.  4) A. Fib: In A- Fib this morning. Rate controlled.  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 Low WBC.  Low platelets. Consistently low over the last few years.   For questions or updates, please contact [place] Please consult [email or web address] for contact info under     Signed, Neva Seat, MD  08/09/2018, 9:27 AM    I have examined the patient and reviewed assessment and plan and discussed with patient.  Agree with above as stated.  Changed to Amio tablets.  Coumadin restarted.  Diuresed with IV Lasix. Cr stable. Walk with PT.  Possible d/c later today if he is able to walk with them and maintain oxygen sats off supplemental oxygen.   Larae Grooms

## 2018-08-09 NOTE — Discharge Summary (Addendum)
Discharge Summary    Patient ID: [name],  MRN: [id number], DOB/AGE: 65-Mar-1952 65 y.o.  Admit date: 08/05/2018 Discharge date: 08/09/2018  Primary Care Provider: [name]. Primary Cardiologist: Dr. [name]   Discharge Diagnoses    Principal Problem:   Ventricular tachycardia ([place]) Active Problems:   HTN (hypertension)   HLD (hyperlipidemia)   CAD (coronary artery disease)   Atrial fibrillation (H[facility])   Chronic systolic CHF (congestive heart failure) ([place])   CAP (community acquired pneumonia)  Allergies Allergies  Allergen Reactions  . Other Anaphylaxis    Spider venom  . Grapefruit Concentrate Other (See Comments)    Pt has been told not to eat grapefruit  . Haloperidol Other (See Comments)    Too sedated  . Nsaids Other (See Comments)    Cannot take due to liver transplant  . Rifampin Other (See Comments)    Caused flushing  . Triazolam Other (See Comments)    Too sedated    Diagnostic Studies/Procedures    Cath: 08/07/18   Prox RCA lesion is 100% stenosed.  Acute Mrg lesion is 100% stenosed.  Ost Ramus lesion is 100% stenosed.  Ost Cx to Prox Cx lesion is 100% stenosed.  Ost LM to Mid LM lesion is 50% stenosed.  Origin to Prox Graft lesion is 100% stenosed.  Origin to Prox Graft lesion is 60% stenosed.   1. Severe native 3 vessel CAD with total occlusion of the LAD, LCx, and RCA 2. S/P CABG with continued patency of the LIMA-LAD, SVG-ramus, and SVG-PDA 3. Chronic occlusion of the SVG-OM1 4. Moderate stenosis of the SVG-ramus. Favor medical therapy considering comorbidities, thrombocytopenia, chronic anticoagulation 5. Elevated LVEDP _____________   History of Present Illness     67 year old gentleman with CAD s/p CABG in 1992, HTN, HL, atrial fibrillation, ESRD s/p renal transplant, ESLD s/p liver transplant who presented to [facility] on 9/25 due to increased shortness of breath and fever. Treated for CAP but developed  recurrent episodes of VT for which he was transferred to [facility].   The patient reported that he was doing well, in his baseline state of health until the night prior to admission. He noted significant increase in his baseline mild shortness of breath with exertion. Also noted fever, chills. Due to these symptoms he presented to [facility] where he underwent treatment for sepsis 2/2 klebsiella pneumonia. He had improved with antibiotic treatment and was scheduled to be discharged when he developed recurrent runs of NSVT followed by sustained VT. His case was discussed with Dr. Sallyanne Kuster who accepted the patient in transfer. During ambulance transfer, patient was noted to have recurrent sustained, but self-limited VT lasting approximately 1 minute. Shortly after admission to [facility], patient had additional 1 minute run of VT followed shortly after by sustained VT in the 180s for approximately 10 minutes. Patient remained hemodynamically stable during that time. He was given IV amiodarone and IV metoprolol and returned to atrial fibrillation with HRs in the 70s. During episodes of VT, patient reports increase in dyspnea as well as chest discomfort. No history of VT previously, did not have an ICD in place. He was admitted to [facility] and continued on IV amiodarone.   Hospital Course     Responded well to IV amiodarone. VT was noted to be associated angina, therefore he was planned to undergo cardiac cath. Warfarin was held. EP was consulted and seen by Dr. Rayann Heman. Recommended to increase his coreg 12.5mg   BID and agreed with plan for cath. Underwent cardiac cath noted above with old LAD, LCx and RCA occlusion. Occluded OM1, moderate occlusion of the ramus. No significant cause of his VT. Plan for medical management. He was given IV lasix during this admission with good UOP. Weight at the time of discharge was 202.6lbs. Follow up with EP recommendations noted no plan for ICD given lack of hemodynamic  instability and conservative approach. He was transitioned to oral amiodarone 200mg  daily, and increased coreg 25mg  BID. He was also treated for CAP and completed his course of Ceftriaxone. Platelet count noted around 40,000-70,000 throughout admission which is his baseline. INR noted at 1.53 at the time of discharge. Will have him take 6mg  this evening and again tomorrow, then resume regular schedule. Of note, seen by PT who recommended HHPT and rolling walker which were ordered at the time of discharge.   KRIS NO was seen by Dr. Irish Lack and determined stable for discharge home. Follow up in the office has been arranged. Medications are listed below.   _____________  Discharge Vitals Blood pressure 139/65, pulse 99, temperature 98.3 F (36.8 C), temperature source Oral, resp. rate (!) 26, height 5\' 10"  (1.778 m), weight 91.9 kg, SpO2 96 %.  Filed Weights   08/07/18 0300 08/08/18 0433 08/09/18 0540  Weight: 93.4 kg 93.8 kg 91.9 kg    Labs & Radiologic Studies    CBC Recent Labs    08/08/18 0238 08/09/18 0236  WBC 4.6 4.5  HGB 11.9* 11.3*  HCT 38.6* 35.6*  MCV 86.4 86.2  PLT 57* 70*   Basic Metabolic Panel Recent Labs    08/08/18 1125 08/09/18 0236  NA 138 138  K 3.7 3.4*  CL 103 100  CO2 28 28  GLUCOSE 117* 99  BUN 11 12  CREATININE 1.19 1.24  CALCIUM 9.5 9.3   Liver Function Tests No results for input(s): AST, ALT, ALKPHOS, BILITOT, PROT, ALBUMIN in the last 72 hours. No results for input(s): LIPASE, AMYLASE in the last 72 hours. Cardiac Enzymes No results for input(s): CKTOTAL, CKMB, CKMBINDEX, TROPONINI in the last 72 hours. BNP Invalid input(s): POCBNP D-Dimer No results for input(s): DDIMER in the last 72 hours. Hemoglobin A1C No results for input(s): HGBA1C in the last 72 hours. Fasting Lipid Panel No results for input(s): CHOL, HDL, LDLCALC, TRIG, CHOLHDL, LDLDIRECT in the last 72 hours. Thyroid Function Tests No results for input(s): TSH,  T4TOTAL, T3FREE, THYROIDAB in the last 72 hours.  Invalid input(s): FREET3 _____________  Dg Chest Port 1 View  Result Date: 08/05/2018 CLINICAL DATA:  Tachycardia, shortness of breath, AFib EXAM: PORTABLE CHEST 1 VIEW COMPARISON:  08/03/2017 FINDINGS: Mild left perihilar opacity. Mild right infrahilar opacity/atelectasis with associated eventration of the right hemidiaphragm. Trace left pleural effusion. Cardiomegaly.  Postsurgical changes related to prior CABG. Median sternotomy. IMPRESSION: Cardiomegaly with suspected mild perihilar edema. Mild right infrahilar opacity/atelectasis with associated eventration of the right hemidiaphragm. Trace left pleural effusion. Electronically Signed   By: Julian Hy M.D.   On: 08/05/2018 19:04   Disposition   Pt is being discharged home today in good condition.  Follow-up Plans & Appointments    Follow-up Information    Deboraha Sprang, MD Follow up on 08/09/2018.   Specialty:  Cardiology Why:  Office will call you with a follow up appt.  Contact information: [address] [place] [address] [place]  CARDIOVASCULAR DIVISION Follow up on 08/14/2018.   Why:  at 3:15pm for your INR check.  Contact information: [place] [address] [phone]         Discharge Instructions    (HEART FAILURE PATIENTS) Call MD:  Anytime you have any of the following symptoms: 1) 3 pound weight gain in 24 hours or 5 pounds in 1 week 2) shortness of breath, with or without a dry hacking cough 3) swelling in the hands, feet or stomach 4) if you have to sleep on extra pillows at night in order to breathe.   Complete by:  As directed    Call MD for:  redness, tenderness, or signs of infection (pain, swelling, redness, odor or green/yellow discharge around incision site)   Complete by:  As directed    Diet - low sodium heart healthy   Complete by:   As directed    Discharge instructions   Complete by:  As directed    Groin Site Care Refer to this sheet in the next few weeks. These instructions provide you with information on caring for yourself after your procedure. Your caregiver may also give you more specific instructions. Your treatment has been planned according to current medical practices, but problems sometimes occur. Call your caregiver if you have any problems or questions after your procedure. HOME CARE INSTRUCTIONS You may shower 24 hours after the procedure. Remove the bandage (dressing) and gently wash the site with plain soap and water. Gently pat the site dry.  Do not apply powder or lotion to the site.  Do not sit in a bathtub, swimming pool, or whirlpool for 5 to 7 days.  No bending, squatting, or lifting anything over 10 pounds (4.5 kg) as directed by your caregiver.  Inspect the site at least twice daily.  Do not drive home if you are discharged the same day of the procedure. Have someone else drive you.  You may drive 24 hours after the procedure unless otherwise instructed by your caregiver.  What to expect: Any bruising will usually fade within 1 to 2 weeks.  Blood that collects in the tissue (hematoma) may be painful to the touch. It should usually decrease in size and tenderness within 1 to 2 weeks.  SEEK IMMEDIATE MEDICAL CARE IF: You have unusual pain at the groin site or down the affected leg.  You have redness, warmth, swelling, or pain at the groin site.  You have drainage (other than a small amount of blood on the dressing).  You have chills.  You have a fever or persistent symptoms for more than 72 hours.  You have a fever and your symptoms suddenly get worse.  Your leg becomes pale, cool, tingly, or numb.  You have heavy bleeding from the site. Hold pressure on the site. .  Please take 6mg  coumadin tonight and tomorrow, then plan to resume your usual dose with INR check on Monday. NO DRIVING UNTIL SEEN  BACK IN THE OFFICE BY DR. [name]!!!   Increase activity slowly   Complete by:  As directed       Discharge Medications     Medication List    TAKE these medications   acetaminophen 500 MG tablet Commonly known as:  TYLENOL Take 1,000 mg by mouth every 6 (six) hours as needed for headache (pain).   albuterol (2.5 MG/3ML) 0.083% nebulizer solution Commonly known as:  PROVENTIL Take 2.5 mg by nebulization every 2 (two) hours as needed for  wheezing or shortness of breath.   allopurinol 100 MG tablet Commonly known as:  ZYLOPRIM Take 100 mg by mouth daily.   amiodarone 200 MG tablet Commonly known as:  PACERONE Take 1 tablet (200 mg total) by mouth daily. Start taking on:  08/10/2018   atorvastatin 10 MG tablet Commonly known as:  LIPITOR Take 1 tablet (10 mg total) by mouth daily. What changed:  how much to take   carvedilol 25 MG tablet Commonly known as:  COREG Take 1 tablet (25 mg total) by mouth 2 (two) times daily with a meal. What changed:    medication strength  how much to take  when to take this   DULoxetine 60 MG capsule Commonly known as:  CYMBALTA Take 60 mg by mouth at bedtime.   EPIPEN 0.3 mg/0.3 mL Devi Generic drug:  EPINEPHrine Inject 0.3 mg into the muscle once as needed (severe allergic reaction).   ferrous sulfate 325 (65 FE) MG tablet Take 325 mg by mouth daily with breakfast.   fish oil-omega-3 fatty acids 1000 MG capsule Take 2 g by mouth daily.   furosemide 40 MG tablet Commonly known as:  LASIX Take 1 tablet by mouth daily.   Melatonin 1 MG Tabs Take 1 mg by mouth at bedtime.   nitroGLYCERIN 0.4 MG SL tablet Commonly known as:  NITROSTAT Place 0.4 mg under the tongue every 5 (five) minutes as needed for chest pain.   potassium chloride SA 20 MEQ tablet Commonly known as:  K-DUR,KLOR-CON Take 2 tablets (40 mEq total) by mouth daily. Start taking on:  08/10/2018 What changed:    medication strength  how much to take     predniSONE 5 MG tablet Commonly known as:  DELTASONE Take 1 tablet (5 mg total) by mouth daily.   QUEtiapine 100 MG tablet Commonly known as:  SEROQUEL Take 100 mg by mouth at bedtime.   tacrolimus 0.5 MG capsule Commonly known as:  PROGRAF Take 0.5 mg by mouth 2 (two) times daily.   traZODone 150 MG tablet Commonly known as:  DESYREL Take 150 mg by mouth at bedtime as needed for sleep.   Vitamin B12 500 MCG Tabs Take 500 mcg by mouth daily.   warfarin 4 MG tablet Commonly known as:  COUMADIN Take as directed. If you are unsure how to take this medication, talk to your nurse or doctor. Original instructions:  TAKE AS DIRECTED BY COUMADIN CLINIC. What changed:  See the new instructions.        Acute coronary syndrome (MI, NSTEMI, STEMI, etc) this admission?: No.     Outstanding Labs/Studies   INR check 10/7  Duration of Discharge Encounter   Greater than 30 minutes including physician time.  Signed, Reino Bellis NP-C 08/09/2018, 4:54 PM  I have examined the patient and reviewed assessment and plan and discussed with patient.  Agree with above as stated.  Changed to Amio tablets.  Coumadin restarted.  Diuresed with IV Lasix. Cr stable. Walk with PT.  D/C today as he was able to maintain oxygen sats off supplemental oxygen.   Larae Grooms

## 2018-08-09 NOTE — Evaluation (Signed)
Physical Therapy Evaluation Patient Details Name: [name] MRN: [id number] DOB: [birth year] Today's Date: 08/09/2018   History of Present Illness  Pt is a 67 y/o male transferred from [place] secondary to V tach. Pt presenting at [facility] with increased SOB. Pt is s/p heart cath as well. PMH includes HTN, CAD, a fib, CHF, ESRD s/p renal transplant, and ESLD s/p liver transplant.   Clinical Impression  Pt admitted secondary to problem above with deficits below. Pt with mild unsteadiness during gait without AD, and pt with 1 LOB, however, able to self recover. Required min guard A for ambulation. Pt with increased SOB, however, oxygen sats WFL on RA. HR ranging from low to mid 80s during ambulation. Educated about use of rollator for mobility given decreased activity tolerance, however, pt will likely refuse. Pt reports his brother can check in on him at d/c as well. Will continue to follow acutely to maximize functional mobility independence and safety.     Follow Up Recommendations Home health PT;Supervision for mobility/OOB    Equipment Recommendations  Other (comment)(rollator )    Recommendations for Other Services       Precautions / Restrictions Precautions Precautions: None Restrictions Weight Bearing Restrictions: No      Mobility  Bed Mobility Overal bed mobility: Modified Independent                Transfers Overall transfer level: Needs assistance Equipment used: None Transfers: Sit to/from Stand Sit to Stand: Supervision         General transfer comment: Supervision for safety.   Ambulation/Gait Ambulation/Gait assistance: Min guard Gait Distance (Feet): 100 Feet Assistive device: None Gait Pattern/deviations: Step-through pattern;Decreased stride length Gait velocity: Decreased    General Gait Details: Slow, guarded gait. Mild unsteadiness noted, with 1 LOB, however, pt able to self recover. Pt with incresaed SOB, however, oxygen  sats >90% on RA throughout. HR ranging from low to mid 80s.   Stairs            Wheelchair Mobility    Modified Rankin (Stroke Patients Only)       Balance Overall balance assessment: Needs assistance Sitting-balance support: No upper extremity supported;Feet supported Sitting balance-Leahy Scale: Good     Standing balance support: No upper extremity supported;During functional activity Standing balance-Leahy Scale: Fair                               Pertinent Vitals/Pain Pain Assessment: No/denies pain    Home Living Family/patient expects to be discharged to:: Private residence Living Arrangements: Alone Available Help at Discharge: Family;Available PRN/intermittently Type of Home: House Home Access: Stairs to enter Entrance Stairs-Rails: Left Entrance Stairs-Number of Steps: 7-8 Home Layout: One level Home Equipment: None      Prior Function Level of Independence: Independent               Hand Dominance        Extremity/Trunk Assessment   Upper Extremity Assessment Upper Extremity Assessment: Generalized weakness    Lower Extremity Assessment Lower Extremity Assessment: Generalized weakness    Cervical / Trunk Assessment Cervical / Trunk Assessment: Normal  Communication   Communication: No difficulties  Cognition Arousal/Alertness: Awake/alert Behavior During Therapy: WFL for tasks assessed/performed Overall Cognitive Status: Within Functional Limits for tasks assessed  General Comments General comments (skin integrity, edema, etc.): Educated about use of rollator for longer distances, however, pt will likely refuse.     Exercises     Assessment/Plan    PT Assessment Patient needs continued PT services  PT Problem List Decreased strength;Decreased balance;Decreased mobility;Decreased activity tolerance;Cardiopulmonary status limiting activity;Decreased knowledge  of use of DME       PT Treatment Interventions DME instruction;Gait training;Stair training;Functional mobility training;Therapeutic activities;Therapeutic exercise;Balance training;Patient/family education    PT Goals (Current goals can be found in the Care Plan section)  Acute Rehab PT Goals Patient Stated Goal: to go home  PT Goal Formulation: With patient Time For Goal Achievement: 08/23/18 Potential to Achieve Goals: Good    Frequency Min 3X/week   Barriers to discharge        Co-evaluation               AM-PAC PT "6 Clicks" Daily Activity  Outcome Measure Difficulty turning over in bed (including adjusting bedclothes, sheets and blankets)?: None Difficulty moving from lying on back to sitting on the side of the bed? : A Little Difficulty sitting down on and standing up from a chair with arms (e.g., wheelchair, bedside commode, etc,.)?: A Little Help needed moving to and from a bed to chair (including a wheelchair)?: A Little Help needed walking in hospital room?: A Little Help needed climbing 3-5 steps with a railing? : A Little 6 Click Score: 19    End of Session Equipment Utilized During Treatment: Gait belt Activity Tolerance: Patient limited by fatigue Patient left: in bed;with call bell/phone within reach(sitting EOB ) Nurse Communication: Mobility status PT Visit Diagnosis: Unsteadiness on feet (R26.81);Muscle weakness (generalized) (M62.81)    Time: 6468-0321 PT Time Calculation (min) (ACUTE ONLY): 13 min   Charges:   PT Evaluation $PT Eval Low Complexity: Addison, PT, DPT  Acute Rehabilitation Services  Pager: [phone] Office: [phone]   Rudean Hitt 08/09/2018, 12:10 PM

## 2018-08-09 NOTE — Discharge Instructions (Signed)

## 2018-08-15 LAB — POCT INR: INR: 2.7 (ref 2.0–3.0)

## 2018-08-16 ENCOUNTER — Ambulatory Visit (INDEPENDENT_AMBULATORY_CARE_PROVIDER_SITE_OTHER): Payer: Medicare Other | Admitting: Cardiovascular Disease

## 2018-08-16 DIAGNOSIS — Z5181 Encounter for therapeutic drug level monitoring: Secondary | ICD-10-CM | POA: Diagnosis not present

## 2018-08-16 DIAGNOSIS — Z7901 Long term (current) use of anticoagulants: Secondary | ICD-10-CM

## 2018-08-23 LAB — POCT INR: INR: 2.1 (ref 2.0–3.0)

## 2018-08-24 ENCOUNTER — Ambulatory Visit (INDEPENDENT_AMBULATORY_CARE_PROVIDER_SITE_OTHER): Payer: Medicare Other | Admitting: Cardiology

## 2018-08-24 DIAGNOSIS — Z5181 Encounter for therapeutic drug level monitoring: Secondary | ICD-10-CM | POA: Diagnosis not present

## 2018-08-24 DIAGNOSIS — Z7901 Long term (current) use of anticoagulants: Secondary | ICD-10-CM

## 2018-08-30 ENCOUNTER — Ambulatory Visit (INDEPENDENT_AMBULATORY_CARE_PROVIDER_SITE_OTHER): Payer: Medicare Other | Admitting: Internal Medicine

## 2018-08-30 DIAGNOSIS — Z7901 Long term (current) use of anticoagulants: Secondary | ICD-10-CM

## 2018-08-30 DIAGNOSIS — Z5181 Encounter for therapeutic drug level monitoring: Secondary | ICD-10-CM

## 2018-08-30 LAB — POCT INR: INR: 2.3 (ref 2.0–3.0)

## 2018-09-08 ENCOUNTER — Ambulatory Visit (INDEPENDENT_AMBULATORY_CARE_PROVIDER_SITE_OTHER): Payer: Medicare Other | Admitting: Pharmacist

## 2018-09-08 DIAGNOSIS — Z7901 Long term (current) use of anticoagulants: Secondary | ICD-10-CM | POA: Diagnosis not present

## 2018-09-08 DIAGNOSIS — Z5181 Encounter for therapeutic drug level monitoring: Secondary | ICD-10-CM | POA: Diagnosis not present

## 2018-09-08 LAB — POCT INR: INR: 2.6 (ref 2.0–3.0)

## 2018-09-15 ENCOUNTER — Ambulatory Visit (INDEPENDENT_AMBULATORY_CARE_PROVIDER_SITE_OTHER): Payer: Medicare Other | Admitting: Internal Medicine

## 2018-09-15 DIAGNOSIS — Z5181 Encounter for therapeutic drug level monitoring: Secondary | ICD-10-CM

## 2018-09-15 DIAGNOSIS — Z7901 Long term (current) use of anticoagulants: Secondary | ICD-10-CM

## 2018-09-15 LAB — POCT INR: INR: 2.5 (ref 2.0–3.0)

## 2018-09-15 NOTE — Patient Instructions (Signed)
Description   Amiodarone started 08/09/18 @discharge . Spoke with pt, instructed to continue taking 1 tablet daily.  Recheck INR in 1 week for insurance purposes. 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

## 2018-09-19 ENCOUNTER — Ambulatory Visit: Payer: Medicare Other | Admitting: Internal Medicine

## 2018-09-25 LAB — POCT INR: INR: 3 (ref 2.0–3.0)

## 2018-09-26 ENCOUNTER — Ambulatory Visit (INDEPENDENT_AMBULATORY_CARE_PROVIDER_SITE_OTHER): Payer: Medicare Other | Admitting: Cardiology

## 2018-09-26 DIAGNOSIS — I4891 Unspecified atrial fibrillation: Secondary | ICD-10-CM

## 2018-09-26 DIAGNOSIS — Z7901 Long term (current) use of anticoagulants: Secondary | ICD-10-CM | POA: Diagnosis not present

## 2018-09-26 DIAGNOSIS — Z5181 Encounter for therapeutic drug level monitoring: Secondary | ICD-10-CM

## 2018-09-26 NOTE — Patient Instructions (Signed)
Description   Amiodarone started 08/09/18 @discharge . Spoke with pt and instructed pt to continue taking 1 tablet daily.  Recheck INR in 1 week for insurance purposes. (pt is a Personal assistant) # [phone]

## 2018-10-02 ENCOUNTER — Ambulatory Visit (INDEPENDENT_AMBULATORY_CARE_PROVIDER_SITE_OTHER): Payer: Medicare Other | Admitting: Internal Medicine

## 2018-10-02 ENCOUNTER — Encounter: Payer: Self-pay | Admitting: Internal Medicine

## 2018-10-02 ENCOUNTER — Encounter (INDEPENDENT_AMBULATORY_CARE_PROVIDER_SITE_OTHER): Payer: Self-pay

## 2018-10-02 VITALS — BP 114/68 | HR 77 | Ht 70.0 in | Wt 189.8 lb

## 2018-10-02 DIAGNOSIS — Z7901 Long term (current) use of anticoagulants: Secondary | ICD-10-CM | POA: Diagnosis not present

## 2018-10-02 DIAGNOSIS — I480 Paroxysmal atrial fibrillation: Secondary | ICD-10-CM

## 2018-10-02 DIAGNOSIS — I25700 Atherosclerosis of coronary artery bypass graft(s), unspecified, with unstable angina pectoris: Secondary | ICD-10-CM

## 2018-10-02 LAB — POCT INR: INR: 3.5 — AB (ref 2.0–3.0)

## 2018-10-02 NOTE — Patient Instructions (Signed)
Medication Instructions:   Your physician recommends that you continue on your current medications as directed. Please refer to the Current Medication list given to you today.  Labwork: Your physician recommends that you have lab work drawn: TSH and LFT  Testing/Procedures: Your physician has requested that you have an echocardiogram. Echocardiography is a painless test that uses sound waves to create images of your heart. It provides your doctor with information about the size and shape of your heart and how well your heart's chambers and valves are working. This procedure takes approximately one hour. There are no restrictions for this procedure.  Follow-Up: Your physician recommends that you schedule a follow-up appointment in:   6 weeks with Chanetta Marshall or Dillon Bjork.  Please schedule your echo for the same day as your appointment.  Any Other Special Instructions Will Be Listed Below (If Applicable).     If you need a refill on your cardiac medications before your next appointment, please call your pharmacy.

## 2018-10-02 NOTE — Progress Notes (Signed)
Patient Care Team: [name]., MD as PCP - General (Family Medicine) Deboraha Sprang, MD as Consulting Physician (Cardiology) Jamal Maes, MD as Consulting Physician (Nephrology)   HPI  [name] is a 67 y.o. male seen in followup for permanent atrial fibrillation in the context of CAD with prior CABG and EF 41%;  he has a history of kidney and liver transplant 2008 and is followed at [facility]  When last having been seen 9/16 is completed been shortness of breath. He got better with increasing diuresis. Over recent days it has worsened again. He has some degree of volume overload manifested by edema.  He also notes a reproducible left shoulder discomfort associated with exertion particularly heavy exertion resolving rapidly with rest.     Records and Results Reviewed -HOLTER  6/16  HR excursion quite broad and carvidolol doubled DATE TEST EF   1/16 Myoview  41   anterolateral and inferior scars   9/16 echo  45-50 Reduced RVSF/Mod LAE   2/18 Echo  40-45   11/18 Echo  55-65%   9/19 Echo  35-40%   10/19 LHC  LIMA  SVG R SVG PD patent SVG OM occluded    \ He was hospitalized at [facility] a few weeks ago for worsening and persistent shortness of breath.  He is much better but is not sure what they did.  He was also diagnosed with pancreatitis and was told that his LV function was worse.      he Was hospitalized 9/19 with ventricular tachycardia.  He was seen in consult by Dr. Greggory Brandy.  He has previously had been 55-60% and beta-blockers were initiated.  With repeat echo demonstrating EF 40% or so it was elected to put him on amiodarone.  He has had no interval ventricular tachycardia.  He saw Dr. Maple Hudson in [place].  He raised the question as to whether he should have an ICD.  The patient is a DNR.  He has "no quality of life "with his multiple medical problems, while acknowledging that he is depressed, is having been treated for many years, he is ready  to die.  He does not want to have VT again    Past Medical History:  Diagnosis Date  . Atrial fibrillation ([place]) 08/2009  . CAD (coronary artery disease)    s/p CABG -- 1992  . ESRD (end stage renal disease) ([place])   . HLD (hyperlipidemia)   . HTN (hypertension)   . Idiopathic thrombocytopenia ([facility])     Past Surgical History:  Procedure Laterality Date  . APPENDECTOMY    . CORONARY ARTERY BYPASS GRAFT    . FACIAL COSMETIC SURGERY    . GALLBLADDER SURGERY    . HERNIA REPAIR    . KIDNEY TRANSPLANT    . LEFT HEART CATH AND CORONARY ANGIOGRAPHY N/A 08/07/2018   Procedure: LEFT HEART CATH AND CORONARY ANGIOGRAPHY;  Surgeon: Sherren Mocha, MD;  Location: [facility];  Service: Cardiovascular;  Laterality: N/A;  . LIVER TRANSPLANT      Current Outpatient Medications  Medication Sig Dispense Refill  . acetaminophen (TYLENOL) 500 MG tablet Take 1,000 mg by mouth every 6 (six) hours as needed for headache (pain).    [name] albuterol (PROVENTIL) (2.5 MG/3ML) 0.083% nebulizer solution Take 2.5 mg by nebulization every 2 (two) hours as needed for wheezing or shortness of breath.    . allopurinol (ZYLOPRIM) 100 MG tablet Take 100 mg by mouth daily.    [name]  amiodarone (PACERONE) 200 MG tablet Take 1 tablet (200 mg total) by mouth daily. 30 tablet 1  . atorvastatin (LIPITOR) 20 MG tablet Take 20 mg by mouth daily.    . calcitRIOL (ROCALTROL) 0.25 MCG capsule Take 1 capsule by mouth 3 (three) times a week.    . carvedilol (COREG) 25 MG tablet Take 1 tablet (25 mg total) by mouth 2 (two) times daily with a meal. 60 tablet 2  . Cholecalciferol (VITAMIN D3) 25 MCG (1000 UT) CAPS Take 1 capsule by mouth daily.    . Cyanocobalamin (VITAMIN B12) 500 MCG TABS Take 500 mcg by mouth daily.     . DULoxetine (CYMBALTA) 60 MG capsule Take 60 mg by mouth at bedtime.    [name] EPINEPHrine (EPIPEN) 0.3 mg/0.3 mL DEVI Inject 0.3 mg into the muscle once as needed (severe allergic reaction).     . ferrous sulfate  325 (65 FE) MG tablet Take 325 mg by mouth daily with breakfast.    . fish oil-omega-3 fatty acids 1000 MG capsule Take 2 g by mouth daily.     . fluticasone (FLONASE) 50 MCG/ACT nasal spray Place 2 sprays into both nostrils daily.    . furosemide (LASIX) 40 MG tablet Take 40 mg by mouth 2 (two) times daily.    . Melatonin 1 MG TABS Take 1 mg by mouth at bedtime.    . metolazone (ZAROXOLYN) 5 MG tablet Take 0.5 tablets by mouth 2 (two) times a week. Take twice a week with morning lasix    . nitroGLYCERIN (NITROSTAT) 0.4 MG SL tablet Place 0.4 mg under the tongue every 5 (five) minutes as needed for chest pain.    . potassium chloride (K-DUR,KLOR-CON) 20 MEQ tablet Take 2 tablets (40 mEq total) by mouth daily. 30 tablet 1  . predniSONE (DELTASONE) 5 MG tablet Take 1 tablet (5 mg total) by mouth daily.    . QUEtiapine (SEROQUEL) 100 MG tablet Take 100 mg by mouth at bedtime.    . tacrolimus (PROGRAF) 0.5 MG capsule Take 0.5 mg by mouth 2 (two) times daily.     . traZODone (DESYREL) 150 MG tablet Take 150 mg by mouth at bedtime as needed for sleep.     [name] warfarin (COUMADIN) 4 MG tablet TAKE AS DIRECTED BY COUMADIN CLINIC. (Patient taking differently: Take 4 mg by mouth at bedtime. ) 40 tablet 2   No current facility-administered medications for this visit.     Allergies  Allergen Reactions  . Other Anaphylaxis    Spider venom  . Grapefruit Concentrate Other (See Comments)    Pt has been told not to eat grapefruit  . Haloperidol Other (See Comments)    Too sedated  . Nsaids Other (See Comments)    Cannot take due to liver transplant  . Rifampin Other (See Comments)    Caused flushing  . Triazolam Other (See Comments)    Too sedated      Review of Systems negative except from HPI and PMH  Physical Exam BP 114/68   Pulse 77   Ht 5\' 10"  (1.778 m)   Wt 189 lb 12.8 oz (86.1 kg)   SpO2 96%   BMI 27.23 kg/m  Well developed and nourished in no acute distress HENT normal Neck supple  with JVP-flat Clear Regular rate and rhythm, no murmurs or gallops Abd-soft with active BS No Clubbing cyanosis edema Skin-warm and dry A & Oriented  Grossly normal sensory and motor function  ECG  Atrial fib @ 77 -/13/43  Assessment and  Plan  Atrial fibrillation permanent  CAD  S/p CABG  Congestive heart failure  chronic-systolic/diastolic  Status post renal transplant\\  Ventricular tachycardia  End of Life discussion     \We discussed ventricular tachycardia therapies and the role of an ICD as prevention.  In the context of his long-standing depression, he still would like to live if he felt good. He is lonely and alone and does not think counseling helps  He does not want to have ventricular tachycardia again; in the context of this and the AVID data have suggested that we continue on amiodarone as a rhythm suppressing strategy.  We will reassess his LV function as I suspect it was acutely depressed in the context of ventricular tachycardia.   Have had some end of life discussiojn   For now continue current therapies   Will check amio surveillance    More than 50% of 40 min was spent in counseling related to the above

## 2018-10-03 ENCOUNTER — Ambulatory Visit (INDEPENDENT_AMBULATORY_CARE_PROVIDER_SITE_OTHER): Payer: Medicare Other | Admitting: Interventional Cardiology

## 2018-10-03 DIAGNOSIS — Z5181 Encounter for therapeutic drug level monitoring: Secondary | ICD-10-CM

## 2018-10-03 DIAGNOSIS — Z7901 Long term (current) use of anticoagulants: Secondary | ICD-10-CM

## 2018-10-04 ENCOUNTER — Other Ambulatory Visit: Payer: Self-pay | Admitting: Internal Medicine

## 2018-10-10 ENCOUNTER — Ambulatory Visit (INDEPENDENT_AMBULATORY_CARE_PROVIDER_SITE_OTHER): Payer: Medicare Other | Admitting: Cardiology

## 2018-10-10 DIAGNOSIS — Z5181 Encounter for therapeutic drug level monitoring: Secondary | ICD-10-CM | POA: Diagnosis not present

## 2018-10-10 DIAGNOSIS — Z7901 Long term (current) use of anticoagulants: Secondary | ICD-10-CM

## 2018-10-10 LAB — POCT INR: INR: 2.8 (ref 2.0–3.0)

## 2018-10-12 LAB — HEPATIC FUNCTION PANEL
ALT: 12 IU/L (ref 0–44)
AST: 18 IU/L (ref 0–40)
Albumin: 3.9 g/dL (ref 3.6–4.8)
Alkaline Phosphatase: 63 IU/L (ref 39–117)
Bilirubin Total: 0.9 mg/dL (ref 0.0–1.2)
Bilirubin, Direct: 0.2 mg/dL (ref 0.00–0.40)
TOTAL PROTEIN: 6.2 g/dL (ref 6.0–8.5)

## 2018-10-13 LAB — TSH: TSH: 2.81 u[IU]/mL (ref 0.450–4.500)

## 2018-10-17 ENCOUNTER — Inpatient Hospital Stay (HOSPITAL_COMMUNITY)
Admission: EM | Admit: 2018-10-17 | Discharge: 2018-10-19 | DRG: 194 | Disposition: A | Discharge status: Home / Self Care | Payer: Medicare Other | Attending: Internal Medicine | Admitting: Internal Medicine

## 2018-10-17 ENCOUNTER — Encounter (HOSPITAL_COMMUNITY): Payer: Self-pay | Admitting: *Deleted

## 2018-10-17 ENCOUNTER — Emergency Department (HOSPITAL_COMMUNITY): Payer: Medicare Other

## 2018-10-17 ENCOUNTER — Other Ambulatory Visit: Payer: Self-pay

## 2018-10-17 DIAGNOSIS — Z8051 Family history of malignant neoplasm of kidney: Secondary | ICD-10-CM | POA: Diagnosis not present

## 2018-10-17 DIAGNOSIS — I4891 Unspecified atrial fibrillation: Secondary | ICD-10-CM | POA: Diagnosis present

## 2018-10-17 DIAGNOSIS — I2510 Atherosclerotic heart disease of native coronary artery without angina pectoris: Secondary | ICD-10-CM | POA: Diagnosis present

## 2018-10-17 DIAGNOSIS — J189 Pneumonia, unspecified organism: Secondary | ICD-10-CM

## 2018-10-17 DIAGNOSIS — Z91018 Allergy to other foods: Secondary | ICD-10-CM | POA: Diagnosis not present

## 2018-10-17 DIAGNOSIS — Z8349 Family history of other endocrine, nutritional and metabolic diseases: Secondary | ICD-10-CM | POA: Diagnosis not present

## 2018-10-17 DIAGNOSIS — J129 Viral pneumonia, unspecified: Secondary | ICD-10-CM | POA: Diagnosis present

## 2018-10-17 DIAGNOSIS — D693 Immune thrombocytopenic purpura: Secondary | ICD-10-CM | POA: Diagnosis present

## 2018-10-17 DIAGNOSIS — Z944 Liver transplant status: Secondary | ICD-10-CM

## 2018-10-17 DIAGNOSIS — N183 Chronic kidney disease, stage 3 (moderate): Secondary | ICD-10-CM | POA: Diagnosis present

## 2018-10-17 DIAGNOSIS — B348 Other viral infections of unspecified site: Secondary | ICD-10-CM | POA: Diagnosis present

## 2018-10-17 DIAGNOSIS — I132 Hypertensive heart and chronic kidney disease with heart failure and with stage 5 chronic kidney disease, or end stage renal disease: Secondary | ICD-10-CM | POA: Diagnosis present

## 2018-10-17 DIAGNOSIS — N179 Acute kidney failure, unspecified: Secondary | ICD-10-CM | POA: Diagnosis present

## 2018-10-17 DIAGNOSIS — E785 Hyperlipidemia, unspecified: Secondary | ICD-10-CM | POA: Diagnosis present

## 2018-10-17 DIAGNOSIS — T8619 Other complication of kidney transplant: Secondary | ICD-10-CM | POA: Diagnosis present

## 2018-10-17 DIAGNOSIS — Z886 Allergy status to analgesic agent status: Secondary | ICD-10-CM

## 2018-10-17 DIAGNOSIS — D899 Disorder involving the immune mechanism, unspecified: Secondary | ICD-10-CM

## 2018-10-17 DIAGNOSIS — Z8679 Personal history of other diseases of the circulatory system: Secondary | ICD-10-CM

## 2018-10-17 DIAGNOSIS — K7469 Other cirrhosis of liver: Secondary | ICD-10-CM | POA: Diagnosis present

## 2018-10-17 DIAGNOSIS — I10 Essential (primary) hypertension: Secondary | ICD-10-CM | POA: Diagnosis not present

## 2018-10-17 DIAGNOSIS — F319 Bipolar disorder, unspecified: Secondary | ICD-10-CM | POA: Diagnosis present

## 2018-10-17 DIAGNOSIS — M109 Gout, unspecified: Secondary | ICD-10-CM | POA: Diagnosis present

## 2018-10-17 DIAGNOSIS — Z7952 Long term (current) use of systemic steroids: Secondary | ICD-10-CM

## 2018-10-17 DIAGNOSIS — I5022 Chronic systolic (congestive) heart failure: Secondary | ICD-10-CM | POA: Diagnosis present

## 2018-10-17 DIAGNOSIS — I482 Chronic atrial fibrillation, unspecified: Secondary | ICD-10-CM | POA: Diagnosis present

## 2018-10-17 DIAGNOSIS — Y830 Surgical operation with transplant of whole organ as the cause of abnormal reaction of the patient, or of later complication, without mention of misadventure at the time of the procedure: Secondary | ICD-10-CM | POA: Diagnosis present

## 2018-10-17 DIAGNOSIS — Z7951 Long term (current) use of inhaled steroids: Secondary | ICD-10-CM

## 2018-10-17 DIAGNOSIS — R0602 Shortness of breath: Secondary | ICD-10-CM | POA: Diagnosis present

## 2018-10-17 DIAGNOSIS — D849 Immunodeficiency, unspecified: Secondary | ICD-10-CM

## 2018-10-17 DIAGNOSIS — Z881 Allergy status to other antibiotic agents status: Secondary | ICD-10-CM | POA: Diagnosis not present

## 2018-10-17 DIAGNOSIS — Z888 Allergy status to other drugs, medicaments and biological substances status: Secondary | ICD-10-CM | POA: Diagnosis not present

## 2018-10-17 DIAGNOSIS — Z91038 Other insect allergy status: Secondary | ICD-10-CM

## 2018-10-17 DIAGNOSIS — Z8249 Family history of ischemic heart disease and other diseases of the circulatory system: Secondary | ICD-10-CM

## 2018-10-17 DIAGNOSIS — Z79899 Other long term (current) drug therapy: Secondary | ICD-10-CM

## 2018-10-17 DIAGNOSIS — I4821 Permanent atrial fibrillation: Secondary | ICD-10-CM | POA: Diagnosis not present

## 2018-10-17 DIAGNOSIS — Z7901 Long term (current) use of anticoagulants: Secondary | ICD-10-CM

## 2018-10-17 DIAGNOSIS — Z951 Presence of aortocoronary bypass graft: Secondary | ICD-10-CM | POA: Diagnosis not present

## 2018-10-17 LAB — RESPIRATORY PANEL BY PCR
Adenovirus: NOT DETECTED
BORDETELLA PERTUSSIS-RVPCR: NOT DETECTED
Chlamydophila pneumoniae: NOT DETECTED
Coronavirus 229E: NOT DETECTED
Coronavirus HKU1: NOT DETECTED
Coronavirus NL63: NOT DETECTED
Coronavirus OC43: NOT DETECTED
INFLUENZA B-RVPPCR: NOT DETECTED
Influenza A: NOT DETECTED
Metapneumovirus: NOT DETECTED
Mycoplasma pneumoniae: NOT DETECTED
Parainfluenza Virus 1: DETECTED — AB
Parainfluenza Virus 2: NOT DETECTED
Parainfluenza Virus 3: NOT DETECTED
Parainfluenza Virus 4: NOT DETECTED
Respiratory Syncytial Virus: NOT DETECTED
Rhinovirus / Enterovirus: NOT DETECTED

## 2018-10-17 LAB — COMPREHENSIVE METABOLIC PANEL
ALBUMIN: 3.4 g/dL — AB (ref 3.5–5.0)
ALT: 14 U/L (ref 0–44)
ANION GAP: 13 (ref 5–15)
AST: 18 U/L (ref 15–41)
Alkaline Phosphatase: 52 U/L (ref 38–126)
BUN: 16 mg/dL (ref 8–23)
CO2: 25 mmol/L (ref 22–32)
Calcium: 9.2 mg/dL (ref 8.9–10.3)
Chloride: 99 mmol/L (ref 98–111)
Creatinine, Ser: 1.54 mg/dL — ABNORMAL HIGH (ref 0.61–1.24)
GFR calc Af Amer: 54 mL/min — ABNORMAL LOW (ref >60)
GFR calc non Af Amer: 47 mL/min — ABNORMAL LOW (ref >60)
Glucose, Bld: 148 mg/dL — ABNORMAL HIGH (ref 70–99)
Potassium: 3.6 mmol/L (ref 3.5–5.1)
Sodium: 137 mmol/L (ref 135–145)
Total Bilirubin: 3.3 mg/dL — ABNORMAL HIGH (ref 0.3–1.2)
Total Protein: 7 g/dL (ref 6.5–8.1)

## 2018-10-17 LAB — I-STAT TROPONIN, ED: Troponin i, poc: 0.02 ng/mL (ref 0.00–0.08)

## 2018-10-17 LAB — CBC WITH DIFFERENTIAL/PLATELET
Abs Immature Granulocytes: 0.06 10*3/uL (ref 0.00–0.07)
BASOS PCT: 0 %
Basophils Absolute: 0 10*3/uL (ref 0.0–0.1)
Eosinophils Absolute: 0.2 10*3/uL (ref 0.0–0.5)
Eosinophils Relative: 3 %
HCT: 37.6 % — ABNORMAL LOW (ref 39.0–52.0)
Hemoglobin: 11.8 g/dL — ABNORMAL LOW (ref 13.0–17.0)
IMMATURE GRANULOCYTES: 1 %
Lymphocytes Relative: 10 %
Lymphs Abs: 0.5 10*3/uL — ABNORMAL LOW (ref 0.7–4.0)
MCH: 26.6 pg (ref 26.0–34.0)
MCHC: 31.4 g/dL (ref 30.0–36.0)
MCV: 84.7 fL (ref 80.0–100.0)
Monocytes Absolute: 0.3 10*3/uL (ref 0.1–1.0)
Monocytes Relative: 6 %
NEUTROS PCT: 80 %
Neutro Abs: 4.2 10*3/uL (ref 1.7–7.7)
Platelets: 59 10*3/uL — ABNORMAL LOW (ref 150–400)
RBC: 4.44 MIL/uL (ref 4.22–5.81)
RDW: 15.9 % — ABNORMAL HIGH (ref 11.5–15.5)
WBC: 5.3 10*3/uL (ref 4.0–10.5)
nRBC: 0 % (ref 0.0–0.2)

## 2018-10-17 LAB — I-STAT CG4 LACTIC ACID, ED: Lactic Acid, Venous: 1.66 mmol/L (ref 0.5–1.9)

## 2018-10-17 LAB — STREP PNEUMONIAE URINARY ANTIGEN: STREP PNEUMO URINARY ANTIGEN: NEGATIVE

## 2018-10-17 MED ORDER — WARFARIN SODIUM 4 MG PO TABS
4.0000 mg | ORAL_TABLET | Freq: Every day | ORAL | Status: DC
  Administered 2018-10-17: 4 mg via ORAL
  Filled 2018-10-17: qty 1

## 2018-10-17 MED ORDER — ACETAMINOPHEN 500 MG PO TABS: 1000.0000 mg | ORAL_TABLET | Freq: Four times a day (QID) | ORAL | Status: DC | PRN

## 2018-10-17 MED ORDER — CALCITRIOL 0.25 MCG PO CAPS
0.2500 ug | ORAL_CAPSULE | ORAL | Status: DC
  Administered 2018-10-18: 0.25 ug via ORAL
  Filled 2018-10-17: qty 1

## 2018-10-17 MED ORDER — VITAMIN B-12 1000 MCG PO TABS
500.0000 ug | ORAL_TABLET | Freq: Every day | ORAL | Status: DC
  Administered 2018-10-17 – 2018-10-19 (×3): 500 ug via ORAL
  Filled 2018-10-17 (×3): qty 1

## 2018-10-17 MED ORDER — CARVEDILOL 12.5 MG PO TABS
12.5000 mg | ORAL_TABLET | Freq: Two times a day (BID) | ORAL | Status: DC
  Administered 2018-10-18 – 2018-10-19 (×2): 12.5 mg via ORAL
  Filled 2018-10-17 (×3): qty 1

## 2018-10-17 MED ORDER — AZITHROMYCIN 250 MG PO TABS
500.0000 mg | ORAL_TABLET | ORAL | Status: DC
  Administered 2018-10-17 – 2018-10-18 (×2): 500 mg via ORAL
  Filled 2018-10-17 (×2): qty 2

## 2018-10-17 MED ORDER — WARFARIN - PHYSICIAN DOSING INPATIENT
Freq: Every day | Status: DC
  Administered 2018-10-17: 21:00:00

## 2018-10-17 MED ORDER — DULOXETINE HCL 60 MG PO CPEP
60.0000 mg | ORAL_CAPSULE | Freq: Every day | ORAL | Status: DC
  Administered 2018-10-17 – 2018-10-18 (×2): 60 mg via ORAL
  Filled 2018-10-17 (×2): qty 1

## 2018-10-17 MED ORDER — PIPERACILLIN-TAZOBACTAM 3.375 G IVPB 30 MIN
3.3750 g | Freq: Once | INTRAVENOUS | Status: AC
  Administered 2018-10-17: 3.375 g via INTRAVENOUS
  Filled 2018-10-17: qty 50

## 2018-10-17 MED ORDER — TACROLIMUS 0.5 MG PO CAPS
0.5000 mg | ORAL_CAPSULE | Freq: Two times a day (BID) | ORAL | Status: DC
  Administered 2018-10-17 – 2018-10-19 (×4): 0.5 mg via ORAL
  Filled 2018-10-17 (×4): qty 1

## 2018-10-17 MED ORDER — ALLOPURINOL 100 MG PO TABS
100.0000 mg | ORAL_TABLET | Freq: Every day | ORAL | Status: DC
  Administered 2018-10-18 – 2018-10-19 (×2): 100 mg via ORAL
  Filled 2018-10-17 (×2): qty 1

## 2018-10-17 MED ORDER — HYDROCORTISONE NA SUCCINATE PF 100 MG IJ SOLR
50.0000 mg | Freq: Two times a day (BID) | INTRAMUSCULAR | Status: AC
  Administered 2018-10-17 – 2018-10-18 (×3): 50 mg via INTRAVENOUS
  Filled 2018-10-17 (×3): qty 2

## 2018-10-17 MED ORDER — MELATONIN 3 MG PO TABS
1.5000 mg | ORAL_TABLET | Freq: Every day | ORAL | Status: DC
  Administered 2018-10-17 – 2018-10-18 (×2): 1.5 mg via ORAL
  Filled 2018-10-17 (×2): qty 0.5

## 2018-10-17 MED ORDER — QUETIAPINE FUMARATE 25 MG PO TABS
100.0000 mg | ORAL_TABLET | Freq: Every day | ORAL | Status: DC
  Administered 2018-10-17 – 2018-10-18 (×2): 100 mg via ORAL
  Filled 2018-10-17 (×2): qty 4

## 2018-10-17 MED ORDER — FLUTICASONE PROPIONATE 50 MCG/ACT NA SUSP
1.0000 | Freq: Every day | NASAL | Status: DC
  Administered 2018-10-18 – 2018-10-19 (×3): 1 via NASAL
  Filled 2018-10-17: qty 16

## 2018-10-17 MED ORDER — AMIODARONE HCL 200 MG PO TABS
200.0000 mg | ORAL_TABLET | Freq: Every day | ORAL | Status: DC
  Administered 2018-10-18 – 2018-10-19 (×2): 200 mg via ORAL
  Filled 2018-10-17 (×2): qty 1

## 2018-10-17 MED ORDER — TRAZODONE HCL 150 MG PO TABS
150.0000 mg | ORAL_TABLET | Freq: Every evening | ORAL | Status: DC | PRN
  Administered 2018-10-18 – 2018-10-19 (×2): 150 mg via ORAL
  Filled 2018-10-17 (×2): qty 1

## 2018-10-17 MED ORDER — SODIUM CHLORIDE 0.9 % IV BOLUS
500.0000 mL | Freq: Once | INTRAVENOUS | Status: AC
  Administered 2018-10-17: 500 mL via INTRAVENOUS

## 2018-10-17 MED ORDER — SODIUM CHLORIDE 0.9 % IV SOLN
1.0000 g | INTRAVENOUS | Status: DC
  Administered 2018-10-18 (×2): 1 g via INTRAVENOUS
  Filled 2018-10-17 (×3): qty 10

## 2018-10-17 MED ORDER — FERROUS SULFATE 325 (65 FE) MG PO TABS
325.0000 mg | ORAL_TABLET | Freq: Every day | ORAL | Status: DC
  Administered 2018-10-18 – 2018-10-19 (×2): 325 mg via ORAL
  Filled 2018-10-17 (×2): qty 1

## 2018-10-17 NOTE — ED Notes (Signed)
Hospitalist at bedside 

## 2018-10-17 NOTE — H&P (Signed)
HPI  [name] YSA:[id number] DOB: [birth year] DOA: 10/17/2018  PCP: [name]., MD  Seen by Dr. 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 difficile infection Was transiently on dialysis for 2 and half years and then for 6 months Atrial fibrillation CAD status post CABG 1992 Recent admission 9/28-10/2 for recurrent VT in the setting of pneumonia at that admission was diagnosed with Klebsiella pneumonia and-cardiac cath showed no acute changes from prior ultimately he was placed on amiodarone on discharge  Presented to nephrologist's office Dr. [name] 12/10 had a cold for the past 1 to 2 days no sick contacts no diarrhea illnesses Has been feeling some body aches and myalgias in addition has been feeling weak and dizzy and has been taking his usual meds including a diuretic  No chest pain, has felt flushed but has not felt warm   ED Course: In ED given 500 cc bolus started on Zosyn  Review of Systems:   Negative for fever, visual changes, sore throat, rash, new muscle aches, chest pain, SOB, dysuria, bleeding, n/v/abdominal pain.  Past Medical History:  Diagnosis Date  . Atrial fibrillation ([name]) 08/2009  . CAD (coronary artery disease)    s/p CABG -- 1992  . ESRD (end stage renal disease) ([place])   . HLD (hyperlipidemia)   . HTN (hypertension)   . Idiopathic thrombocytopenia (HCC)     Past Surgical History:  Procedure Laterality Date  . APPENDECTOMY    . CORONARY ARTERY BYPASS GRAFT    . FACIAL COSMETIC SURGERY    . GALLBLADDER SURGERY    . HERNIA REPAIR    . KIDNEY TRANSPLANT    . LEFT HEART  CATH AND CORONARY ANGIOGRAPHY N/A 08/07/2018   Procedure: LEFT HEART CATH AND CORONARY ANGIOGRAPHY;  Surgeon: Sherren Mocha, MD;  Location: [facility];  Service: Cardiovascular;  Laterality: N/A;  . LIVER TRANSPLANT       reports that he has never smoked. He has never used smokeless tobacco. He reports that he does not drink alcohol or use drugs. Mobility: Independent at baseline Used to be in textiles retired Mother died of old age recently Father passed away of renal cancer  Allergies  Allergen Reactions  . Other Anaphylaxis    Spider venom  . Grapefruit Concentrate Other (See Comments)    Pt has been told not to eat grapefruit  . Haloperidol Other (See Comments)    Too sedated  . Nsaids Other (See Comments)    Cannot take due to liver transplant  . Rifampin Other (See Comments)    Caused flushing  . Triazolam Other (See Comments)    Too sedated    Family History  Problem Relation Age of Onset  . Atrial fibrillation Mother   . Breast cancer Mother   . Renal cancer Father   . Hypertension Brother   . Hyperlipidemia Brother   . Other Brother        stent   . Renal Disease Maternal Grandmother   . Stroke Maternal Grandmother   . Heart attack Maternal Grandfather   . Cancer Paternal Grandmother   . Heart failure Paternal Grandfather   . Emphysema  Paternal Grandfather   . Bronchiolitis Paternal Grandfather      Prior to Admission medications   Medication Sig Start Date End Date Taking? Authorizing Provider  acetaminophen (TYLENOL) 500 MG tablet Take 1,000 mg by mouth every 6 (six) hours as needed for headache (pain).    [provider]  albuterol (PROVENTIL) (2.5 MG/3ML) 0.083% nebulizer solution Take 2.5 mg by nebulization every 2 (two) hours as needed for wheezing or shortness of breath.    [provider]  allopurinol (ZYLOPRIM) 100 MG tablet Take 100 mg by mouth daily.    [provider]  amiodarone (PACERONE) 200 MG tablet TAKE  1 TABLET ONCE DAILY. 10/04/18   [name], NP  atorvastatin (LIPITOR) 20 MG tablet Take 20 mg by mouth daily.    [provider]  calcitRIOL (ROCALTROL) 0.25 MCG capsule Take 1 capsule by mouth 3 (three) times a week.    [provider]  carvedilol (COREG) 25 MG tablet Take 1 tablet (25 mg total) by mouth 2 (two) times daily with a meal. 08/09/18   [name], NP  Cholecalciferol (VITAMIN D3) 25 MCG (1000 UT) CAPS Take 1 capsule by mouth daily.    [provider]  Cyanocobalamin (VITAMIN B12) 500 MCG TABS Take 500 mcg by mouth daily.     [provider]  DULoxetine (CYMBALTA) 60 MG capsule Take 60 mg by mouth at bedtime.    [provider]  EPINEPHrine (EPIPEN) 0.3 mg/0.3 mL DEVI Inject 0.3 mg into the muscle once as needed (severe allergic reaction).     [provider]  ferrous sulfate 325 (65 FE) MG tablet Take 325 mg by mouth daily with breakfast.    [provider]  fish oil-omega-3 fatty acids 1000 MG capsule Take 2 g by mouth daily.     [provider]  fluticasone (FLONASE) 50 MCG/ACT nasal spray Place 2 sprays into both nostrils daily.    [provider]  furosemide (LASIX) 40 MG tablet Take 40 mg by mouth 2 (two) times daily.    [provider]  Melatonin 1 MG TABS Take 1 mg by mouth at bedtime.    [provider]  metolazone (ZAROXOLYN) 5 MG tablet Take 0.5 tablets by mouth 2 (two) times a week. Take twice a week with morning lasix 09/14/18   [provider]  nitroGLYCERIN (NITROSTAT) 0.4 MG SL tablet Place 0.4 mg under the tongue every 5 (five) minutes as needed for chest pain.    [provider]  potassium chloride (K-DUR,KLOR-CON) 20 MEQ tablet Take 2 tablets (40 mEq total) by mouth daily. 08/10/18   [name], NP  predniSONE (DELTASONE) 5 MG tablet Take 1 tablet (5 mg total) by mouth daily. 12/29/16   [name], MD  QUEtiapine (SEROQUEL)  100 MG tablet Take 100 mg by mouth at bedtime. 07/18/15   [provider]  tacrolimus (PROGRAF) 0.5 MG capsule Take 0.5 mg by mouth 2 (two) times daily.     [provider]  traZODone (DESYREL) 150 MG tablet Take 150 mg by mouth at bedtime as needed for sleep.     [provider]  warfarin (COUMADIN) 4 MG tablet TAKE AS DIRECTED BY COUMADIN CLINIC. Patient taking differently: Take 4 mg by mouth at bedtime.  07/25/18   Deboraha Sprang, MD    Physical Exam:  Vitals:   10/17/18 1554 10/17/18 1730  BP:  102/77  Pulse:  90  Resp:  [name])  22  Temp: 99.5 F (37.5 C)   SpO2:  95%     Awake alert pleasant no distress runny nose with rhinorrhea slightly icteric  No icterus no pallor throat is clear no submandibular lymphadenopathy  Chest clinically clear no egophony no fremitus  S1-S2 irregular but rate controlled A. fib  Abdomen soft nontender  Power 5/5 upper extremities lower extremities sensory grossly intact as is coordination-not formally tested  It is euthymic  I have personally reviewed following labs and imaging studies  Labs:   BUN/creatinine up from baseline currently 16/1.5 usually is in the 1.2-1.3 range  Bilirubin 3.3  Point-of-care troponin 0 0.02  Lactic acid 1.66  Imaging studies:   CXR personally reviewed his two-view and looks like there is a retrocardiac opacity just in front spine on lateral view  Medical tests:   EKG independently reviewed: A. fib with interventricular conduction delay and resultant slight ST-T wave depressions QRS axis is 80  Test discussed with performing physician:  Yes  Decision to obtain old records:   Yes  Review and summation of old records:   Yes  Active Problems:   Long term (current) use of anticoagulants [Z79.01]   HTN (hypertension)   HLD (hyperlipidemia)   Gout   Chronic systolic CHF (congestive heart failure) (HCC)   History of liver transplant ([place])   Assessment/Plan Probable  community-acquired pneumonia-chest x-ray over read shows possible pneumonia on left side-given the fact that he is on immunosuppressants feel it is reasonable to admit him to telemetry convert him from Zosyn to ceftriaxone and azithromycin-I would give him stress dose Solu-Cortef for 2 or 3 doses and then discontinue and place him back on prednisone once he stabilizes Early sepsis hypotension-was hypotensive on management and adjusting meds as below History of VT as well as chronic A. fib chads score about 3 07/2018-continue amiodarone 200 daily dose adjust Coreg from 25-12.5 twice daily-keep on telemetry-obtain a.m. magnesium if and he is not a candidate really for AICD unless his EF drops below 40 Acute kidney injury superimposed on chronic kidney disease stage III-IV baseline seems 0.1 0.4-discontinuing Lasix 40 twice daily and Zaroxolyn 0.5 twice daily he has no real evidence of JVD or lower extremity edema so I feel his cardiomegaly and x-ray is more consistent with pneumonic findings Mild hyperbilirubinemia-his bilirubin is 3 and I suspect this probably because of volume depletion but will need to check INR and labs in the morning CAD with CABG in 1992-07/2018 showed severe three-vessel disease with chronic occlusion SVG-OM1-he can continue on the above meds he is not having chest pain currently H/o of renal and liver transplant-will need tacrolimus levels in the morning and dose adjust the same-again stress dosing as above on steroids 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 Inpatient status is judged to be reasonable and necessary in order to provide the required intensity of service to ensure the patient's safety. The  patient's presenting symptoms, physical exam findings, and initial radiographic and laboratory data in the context of their chronic comorbidities is felt to place them at high risk for further clinical deterioration. Furthermore, it is not anticipated that the patient will be medically stable for discharge from the hospital within 2 midnights of  admission. The following factors support the patient status of inpatient.   " The patient's presenting symptoms include fever cold cough. " The worrisome physical exam findings include hypertension. " The initial radiographic and laboratory data are worrisome because of x-ray shows pneumonia. " The chronic co-morbidities include renal and liver disease.   * I certify that at the point of admission it is my clinical judgment that the patient will require inpatient hospital care spanning beyond 2 midnights from the point of admission due to high intensity of service, high risk for further deterioration and high frequency of surveillance required.*   Lovenox, inpatient, full code, 65 minutes  Time spent: 54 minutes  Tallan Sandoz, MD  [facility] Direct contact: [phone] --[place]  --[email or web address]; password [email or web address]  7PM-7AM contact night coverage as above  10/17/2018, 5:40 PM

## 2018-10-17 NOTE — ED Notes (Signed)
Pt returned from xray

## 2018-10-17 NOTE — ED Provider Notes (Signed)
[facility] Emergency Department Provider Note MRN:  [id number]  Arrival date & time: 10/17/18     Chief Complaint   Pneumonia   History of Present Illness   [name] is a 67 y.o. year-old male with a history of liver and kidney transplant, CABG presenting to the ED with chief complaint of pneumonia.  Sent here by PCP for concern for pneumonia.  3 to 4 days of malaise, fatigue, nasal congestion, cough, subjective fevers.  Denies headache or vision change, no chest pain, no abdominal pain, no dysuria, no rash.  Worsening mild shortness of breath during this time.  Symptoms are constant, no exacerbating or relieving factors.  Review of Systems  A complete 10 system review of systems was obtained and all systems are negative except as noted in the HPI and PMH.   Patient's Health History    Past Medical History:  Diagnosis Date  . Atrial fibrillation ([place]) 08/2009  . CAD (coronary artery disease)    s/p CABG -- 1992  . ESRD (end stage renal disease) ([place])   . HLD (hyperlipidemia)   . HTN (hypertension)   . Idiopathic thrombocytopenia (HCC)     Past Surgical History:  Procedure Laterality Date  . APPENDECTOMY    . CORONARY ARTERY BYPASS GRAFT    . FACIAL COSMETIC SURGERY    . GALLBLADDER SURGERY    . HERNIA REPAIR    . KIDNEY TRANSPLANT    . LEFT HEART CATH AND CORONARY ANGIOGRAPHY N/A 08/07/2018   Procedure: LEFT HEART CATH AND CORONARY ANGIOGRAPHY;  Surgeon: Sherren Mocha, MD;  Location: [facility];  Service: Cardiovascular;  Laterality: N/A;  . LIVER TRANSPLANT      Family History  Problem Relation Age of Onset  . Atrial fibrillation Mother   . Breast cancer Mother   . Renal cancer Father   . Hypertension Brother   . Hyperlipidemia Brother   . Other Brother        stent   . Renal Disease Maternal Grandmother   . Stroke Maternal Grandmother   . Heart attack Maternal Grandfather   . Cancer Paternal Grandmother   . Heart failure  Paternal Grandfather   . Emphysema Paternal Grandfather   . Bronchiolitis Paternal Grandfather     Social History   Socioeconomic History  . Marital status: Single    Spouse name: Not on file  . Number of children: 0  . Years of education: Not on file  . Highest education level: Not on file  Occupational History  . Occupation: retired  Scientific laboratory technician  . Financial resource strain: Not on file  . Food insecurity:    Worry: Not on file    Inability: Not on file  . Transportation needs:    Medical: Not on file    Non-medical: Not on file  Tobacco Use  . Smoking status: Never Smoker  . Smokeless tobacco: Never Used  Substance and Sexual Activity  . Alcohol use: No  . Drug use: No  . Sexual activity: Not on file  Lifestyle  . Physical activity:    Days per week: Not on file    Minutes per session: Not on file  . Stress: Not on file  Relationships  . Social connections:    Talks on phone: Not on file    Gets together: Not on file    Attends religious service: Not on file    Active member of club or organization: Not on file  Attends meetings of clubs or organizations: Not on file    Relationship status: Not on file  . Intimate partner violence:    Fear of current or ex partner: Not on file    Emotionally abused: Not on file    Physically abused: Not on file    Forced sexual activity: Not on file  Other Topics Concern  . Not on file  Social History Narrative  . Not on file     Physical Exam  Vital Signs and Nursing Notes reviewed Vitals:   10/17/18 1745 10/17/18 1822  BP: 102/80 91/64  Pulse: 88 86  Resp: 16 (!) 27  Temp:    SpO2: 96% 95%    CONSTITUTIONAL: Well-appearing, NAD NEURO:  Alert and oriented x 3, no focal deficits EYES:  eyes equal and reactive ENT/NECK:  no LAD, no JVD CARDIO: Regular rate, well-perfused, normal S1 and S2 PULM: Crackles left base GI/GU:  normal bowel sounds, non-distended, non-tender MSK/SPINE:  No gross deformities, no  edema SKIN:  no rash, atraumatic PSYCH:  Appropriate speech and behavior  Diagnostic and Interventional Summary    EKG Interpretation  Date/Time:  Tuesday October 17 2018 15:45:55 EST Ventricular Rate:  92 PR Interval:    QRS Duration: 120 QT Interval:  391 QTC Calculation: 484 R Axis:   84 Text Interpretation:  Atrial fibrillation Nonspecific intraventricular conduction delay Repol abnrm suggests ischemia, diffuse leads Confirmed by Gerlene Fee [phone]) on 10/17/2018 4:31:41 PM      Labs Reviewed  COMPREHENSIVE METABOLIC PANEL - Abnormal; Notable for the following components:      Result Value   Glucose, Bld 148 (*)    Creatinine, Ser 1.54 (*)    Albumin 3.4 (*)    Total Bilirubin 3.3 (*)    GFR calc non Af Amer 47 (*)    GFR calc Af Amer 54 (*)    All other components within normal limits  CBC WITH DIFFERENTIAL/PLATELET - Abnormal; Notable for the following components:   Hemoglobin 11.8 (*)    HCT 37.6 (*)    RDW 15.9 (*)    Platelets 59 (*)    Lymphs Abs 0.5 (*)    All other components within normal limits  RESPIRATORY PANEL BY PCR  I-STAT CG4 LACTIC ACID, ED  I-STAT TROPONIN, ED    DG Chest 2 View  Final Result      Medications  hydrocortisone sodium succinate (SOLU-CORTEF) 100 MG injection 50 mg (50 mg Intravenous Given 10/17/18 1826)  sodium chloride 0.9 % bolus 500 mL (0 mLs Intravenous Stopped 10/17/18 1653)  piperacillin-tazobactam (ZOSYN) IVPB 3.375 g (0 g Intravenous Stopped 10/17/18 1655)     Procedures Critical Care Critical Care Documentation Critical care time provided by me (excluding procedures): 35 minutes  Condition necessitating critical care: Hypotension, concern for pneumonia, concern for early sepsis  Components of critical care management: reviewing of prior records, laboratory and imaging interpretation, frequent re-examination and reassessment of vital signs, administration of IV fluid resuscitation, IV antibiotics, discussion with  consulting services    ED Course and Medical Decision Making  I have reviewed the triage vital signs and the nursing notes.  Pertinent labs & imaging results that were available during my care of the patient were reviewed by me and considered in my medical decision making (see below for details).  Immunosuppressed 67 year old male here with concern for pneumonia.  Soft blood pressure on arrival in the 60A systolic, baseline is 540J according to chart review.  Will provide half  liter normal saline to start, monitor closely.  Chest x-ray without obvious lobar process but new infiltrates.  Given the clinical picture of cough, malaise, soft blood pressure, provided Zosyn here in the emergency department.  Admitted to hospital service, stepdown unit for further care and evaluation, concern for pneumonia in the immunocompromise status.  Barth Kirks. [place]onia Small, [place] [email or web address]  Final Clinical Impressions(s) / ED Diagnoses     ICD-10-CM   1. Pneumonia due to infectious organism, unspecified laterality, unspecified part of lung J18.9   2. Immunocompromised [facility]) D89.9     ED Discharge Orders    None         Maudie Flakes, MD 10/17/18 [id number]

## 2018-10-17 NOTE — ED Triage Notes (Signed)
Pt sent over by PCP for possible pneumonia, pt was recently admitted with same, no distress on arrival

## 2018-10-18 DIAGNOSIS — I5022 Chronic systolic (congestive) heart failure: Secondary | ICD-10-CM

## 2018-10-18 DIAGNOSIS — I4821 Permanent atrial fibrillation: Secondary | ICD-10-CM

## 2018-10-18 DIAGNOSIS — Z944 Liver transplant status: Secondary | ICD-10-CM

## 2018-10-18 DIAGNOSIS — J189 Pneumonia, unspecified organism: Secondary | ICD-10-CM

## 2018-10-18 LAB — CBC WITH DIFFERENTIAL/PLATELET
Abs Immature Granulocytes: 0.08 10*3/uL — ABNORMAL HIGH (ref 0.00–0.07)
Basophils Absolute: 0 10*3/uL (ref 0.0–0.1)
Basophils Relative: 1 %
Eosinophils Absolute: 0.1 10*3/uL (ref 0.0–0.5)
Eosinophils Relative: 2 %
HCT: 34.7 % — ABNORMAL LOW (ref 39.0–52.0)
Hemoglobin: 11.1 g/dL — ABNORMAL LOW (ref 13.0–17.0)
Immature Granulocytes: 2 %
Lymphocytes Relative: 12 %
Lymphs Abs: 0.5 10*3/uL — ABNORMAL LOW (ref 0.7–4.0)
MCH: 26.2 pg (ref 26.0–34.0)
MCHC: 32 g/dL (ref 30.0–36.0)
MCV: 82 fL (ref 80.0–100.0)
Monocytes Absolute: 0.3 10*3/uL (ref 0.1–1.0)
Monocytes Relative: 7 %
Neutro Abs: 3.3 10*3/uL (ref 1.7–7.7)
Neutrophils Relative %: 76 %
Platelets: 60 10*3/uL — ABNORMAL LOW (ref 150–400)
RBC: 4.23 MIL/uL (ref 4.22–5.81)
RDW: 15.9 % — ABNORMAL HIGH (ref 11.5–15.5)
WBC: 4.3 10*3/uL (ref 4.0–10.5)
nRBC: 0 % (ref 0.0–0.2)

## 2018-10-18 LAB — COMPREHENSIVE METABOLIC PANEL
ALT: 15 U/L (ref 0–44)
AST: 16 U/L (ref 15–41)
Albumin: 3 g/dL — ABNORMAL LOW (ref 3.5–5.0)
Alkaline Phosphatase: 45 U/L (ref 38–126)
Anion gap: 11 (ref 5–15)
BUN: 20 mg/dL (ref 8–23)
CO2: 26 mmol/L (ref 22–32)
Calcium: 9.1 mg/dL (ref 8.9–10.3)
Chloride: 100 mmol/L (ref 98–111)
Creatinine, Ser: 1.68 mg/dL — ABNORMAL HIGH (ref 0.61–1.24)
GFR calc Af Amer: 49 mL/min — ABNORMAL LOW (ref >60)
GFR calc non Af Amer: 42 mL/min — ABNORMAL LOW (ref >60)
Glucose, Bld: 148 mg/dL — ABNORMAL HIGH (ref 70–99)
Potassium: 3.8 mmol/L (ref 3.5–5.1)
Sodium: 137 mmol/L (ref 135–145)
Total Bilirubin: 2.7 mg/dL — ABNORMAL HIGH (ref 0.3–1.2)
Total Protein: 6.3 g/dL — ABNORMAL LOW (ref 6.5–8.1)

## 2018-10-18 LAB — PROTIME-INR
INR: 3.13
Prothrombin Time: 31.7 seconds — ABNORMAL HIGH (ref 11.4–15.2)

## 2018-10-18 LAB — HIV ANTIBODY (ROUTINE TESTING W REFLEX): HIV Screen 4th Generation wRfx: NONREACTIVE

## 2018-10-18 MED ORDER — WARFARIN - PHARMACIST DOSING INPATIENT: Freq: Every day | Status: DC

## 2018-10-18 MED ORDER — PREDNISONE 10 MG PO TABS
10.0000 mg | ORAL_TABLET | Freq: Every day | ORAL | Status: DC
  Administered 2018-10-19: 10 mg via ORAL
  Filled 2018-10-18: qty 1

## 2018-10-18 MED ORDER — FUROSEMIDE 10 MG/ML IJ SOLN
40.0000 mg | Freq: Once | INTRAMUSCULAR | Status: AC
  Administered 2018-10-18: 40 mg via INTRAVENOUS
  Filled 2018-10-18: qty 4

## 2018-10-18 NOTE — Progress Notes (Signed)
ANTICOAGULATION CONSULT NOTE - Initial Consult  Pharmacy Consult for Coumadin Indication: atrial fibrillation  Allergies  Allergen Reactions  . Other Anaphylaxis    Spider venom  . Grapefruit Concentrate Other (See Comments)    Pt has been told not to eat grapefruit  . Haloperidol Other (See Comments)    Caused the patient to be overly-sedated  . Nsaids Other (See Comments)    Cannot take due to liver transplant  . Rifampin Other (See Comments)    Caused flushing  . Triazolam Other (See Comments)    Caused the patient to be overly-sedated    Patient Measurements: Height: 5\' 10"  (177.8 cm) Weight: 196 lb 10.4 oz (89.2 kg) IBW/kg (Calculated) : 73  Vital Signs: Temp: 98 F (36.7 C) (12/10 2259) Temp Source: Oral (12/10 2259) BP: 97/55 (12/10 2259) Pulse Rate: 69 (12/10 2259)  Labs: Recent Labs    10/17/18 1500 10/18/18 0249  HGB 11.8* 11.1*  HCT 37.6* 34.7*  PLT 59* 60*  LABPROT  --  31.7*  INR  --  3.13  CREATININE 1.54* 1.68*    Estimated Creatinine Clearance: 49.3 mL/min (A) (by C-G formula based on SCr of 1.68 mg/dL (H)).   Medical History: Past Medical History:  Diagnosis Date  . Atrial fibrillation ([place]) 08/2009  . CAD (coronary artery disease)    s/p CABG -- 1992  . ESRD (end stage renal disease) ([place])   . HLD (hyperlipidemia)   . HTN (hypertension)   . Idiopathic thrombocytopenia (HCC)     Medications:  Medications Prior to Admission  Medication Sig Dispense Refill Last Dose  . acetaminophen (TYLENOL) 500 MG tablet Take 1,000 mg by mouth every 6 (six) hours as needed for mild pain or headache.    Unk at [facility]  . allopurinol (ZYLOPRIM) 100 MG tablet Take 100 mg by mouth daily.   10/17/2018 at 1100  . amiodarone (PACERONE) 200 MG tablet TAKE 1 TABLET ONCE DAILY. (Patient taking differently: Take 200 mg by mouth daily. ) 30 tablet 11 10/17/2018 at 1100  . atorvastatin (LIPITOR) 20 MG tablet Take 20 mg by mouth at bedtime.    10/16/2018 at pm  .  calcitRIOL (ROCALTROL) 0.25 MCG capsule Take 1 capsule by mouth every Monday, Wednesday, and Friday.    10/17/2018 at am  . carvedilol (COREG) 25 MG tablet Take 1 tablet (25 mg total) by mouth 2 (two) times daily with a meal. 60 tablet 2 10/17/2018 at 1100  . Cholecalciferol (VITAMIN D3) 25 MCG (1000 UT) CAPS Take 1,000 Units by mouth daily.    10/17/2018 at Unknown time  . Cyanocobalamin (VITAMIN B12) 500 MCG TABS Take 500 mcg by mouth daily.    10/17/2018 at Unknown time  . DULoxetine (CYMBALTA) 60 MG capsule Take 60 mg by mouth at bedtime.   10/16/2018 at pm  . EPINEPHrine (EPIPEN) 0.3 mg/0.3 mL DEVI Inject 0.3 mg into the muscle once as needed (severe allergic reaction).    2012 at 2012  . ferrous sulfate 325 (65 FE) MG tablet Take 325 mg by mouth daily with breakfast.   10/17/2018 at am  . fish oil-omega-3 fatty acids 1000 MG capsule Take 2 g by mouth daily.    10/17/2018 at am  . fluticasone (FLONASE) 50 MCG/ACT nasal spray Place 2 sprays into both nostrils daily as needed for allergies or rhinitis.    Unk at [facility]  . furosemide (LASIX) 40 MG tablet Take 40 mg by mouth 2 (two) times daily.   10/17/2018  at am  . Melatonin 1 MG TABS Take 1 mg by mouth at bedtime.   10/16/2018 at pm  . metolazone (ZAROXOLYN) 5 MG tablet Take 0.5 tablets by mouth 2 (two) times a week.    10/17/2018 at am  . nitroGLYCERIN (NITROSTAT) 0.4 MG SL tablet Place 0.4 mg under the tongue every 5 (five) minutes as needed for chest pain.   Unk at [facility]  . potassium chloride (K-DUR,KLOR-CON) 20 MEQ tablet Take 2 tablets (40 mEq total) by mouth daily. 30 tablet 1 10/17/2018 at am  . predniSONE (DELTASONE) 5 MG tablet Take 1 tablet (5 mg total) by mouth daily.   10/17/2018 at am  . QUEtiapine (SEROQUEL) 100 MG tablet Take 100 mg by mouth at bedtime.   10/16/2018 at pm  . tacrolimus (PROGRAF) 0.5 MG capsule Take 0.5 mg by mouth 2 (two) times daily.    10/17/2018 at 1100  . traZODone (DESYREL) 150 MG tablet Take 150 mg by mouth at  bedtime as needed for sleep.    Unk at [facility]  . warfarin (COUMADIN) 4 MG tablet TAKE AS DIRECTED BY COUMADIN CLINIC. (Patient taking differently: Take 4 mg by mouth at bedtime. ) 40 tablet 2 10/16/2018 at 2300  . albuterol (PROVENTIL) (2.5 MG/3ML) 0.083% nebulizer solution Take 2.5 mg by nebulization every 2 (two) hours as needed for wheezing or shortness of breath.   Not Taking at Unknown time    Assessment: 67 y.o. male admitted with SOB/PNA, h/o AFib, to continue Coumadin.  INR slightly supratherapeutic this morning.  Goal of Therapy:  INR 2-3 Monitor platelets by anticoagulation protocol: Yes   Plan:  Hold Coumadin today Daily INR  Abeni Finchum, Bronson Curb 10/18/2018,7:30 AM

## 2018-10-18 NOTE — Progress Notes (Signed)
PROGRESS NOTE        PATIENT DETAILS Name: [name] Age: 67 y.o. Sex: male Date of Birth: [birth year] Admit Date: 10/17/2018 Admitting Physician Nita Sells, MD WLS:LHTD, Marsh Dolly., MD  Brief Narrative: Patient is a 67 y.o. male with history of renal/liver transplant, chronic systolic heart failure, atrial fibrillation on anticoagulation, chronic thrombocytopenia-presenting with 3 days history of subjective fever, cough, nasal congestion and shortness of breath.  Thought to have pneumonia on chest x-ray and admitted to the hospitalist service.  See below for further details  Subjective: Feels slightly better-lying comfortably in bed-denies any ongoing shortness of breath at rest.  Continues to have nasal congestion and cough.  Assessment/Plan: Community-acquired pneumonia: Chronically sick appearing-but afebrile overnight-no leukocytosis-immunocompromised-as on immunosuppressive's for kidney/renal transplant-continue empiric Rocephin and Zithromax.  Blood cultures negative so far, urine pneumococcal antigen negative, respiratory virus panel positive for parainfluenza virus.  This very well could be viral pneumonitis-but given immunocompromised status-reasonable to continue antibacterials for a few days.  AKI on CKD stage III: Creatinine minimally elevated-volume status is stable-e-continue to monitor closely.  Avoid nephrotoxic agents.  Awaiting Prograf levels.  If renal function worsens markedly-we will consult nephrology.  Renal/liver transplantation: Continue to monitor renal function, LFTs reasonably stable-bilirubin downtrending-continue Prograf, prednisone  History of ventricular tachycardia: Continue amiodarone and Coreg-Coreg dosage decreased slightly to 12.5 mg twice daily-if blood pressure is stable-we will resume usual dosing of 25 mg over the next few days.  History of atrial fibrillation: Telemetry demonstrates A. fib-probably is  chronic A. fib-rate controlled with amiodarone and Coreg.  Coumadin will be dosed by pharmacy.  Chronic systolic heart failure: Volume status is stable-diuretics on hold-follow weights, electrolytes-suspect could resume diuretics in the next few days.  History of CAD-status post CABG: No anginal symptoms-follow.  Thrombocytopenia: Appears to be chronic-and close to his usual baseline.  Bipolar disorder: Stable-continue Cymbalta, Seroquel and trazodone  DVT Prophylaxis: Full dose anticoagulation with Coumadin  Code Status: Full code   Family Communication: None at bedside  Disposition Plan: Remain inpatient  Antimicrobial agents: Anti-infectives (From admission, onward)   Start     Dose/Rate Route Frequency Ordered Stop   10/17/18 2100  cefTRIAXone (ROCEPHIN) 1 g in sodium chloride 0.9 % 100 mL IVPB     1 g 200 mL/hr over 30 Minutes Intravenous Every 24 hours 10/17/18 1921 10/24/18 2059   10/17/18 1930  azithromycin (ZITHROMAX) tablet 500 mg     500 mg Oral Every 24 hours 10/17/18 1921 10/24/18 1929   10/17/18 1600  piperacillin-tazobactam (ZOSYN) IVPB 3.375 g     3.375 g 100 mL/hr over 30 Minutes Intravenous  Once 10/17/18 1558 10/17/18 1655      Procedures: None  CONSULTS:  None  Time spent: 25- minutes-Greater than 50% of this time was spent in counseling, explanation of diagnosis, planning of further management, and coordination of care.  MEDICATIONS: Scheduled Meds: . allopurinol  100 mg Oral Daily  . amiodarone  200 mg Oral Daily  . azithromycin  500 mg Oral Q24H  . calcitRIOL  0.25 mcg Oral Once per day on Mon Wed Fri  . carvedilol  12.5 mg Oral BID WC  . DULoxetine  60 mg Oral QHS  . ferrous sulfate  325 mg Oral Q breakfast  . fluticasone  1 spray Each Nare Daily  . hydrocortisone sod succinate (SOLU-CORTEF) inj  50 mg Intravenous Q12H  . Melatonin  1.5 mg Oral QHS  . QUEtiapine  100 mg Oral QHS  . tacrolimus  0.5 mg Oral BID  . vitamin B-12  500 mcg  Oral Daily  . warfarin  4 mg Oral QHS   Continuous Infusions: . cefTRIAXone (ROCEPHIN)  IV 1 g (10/18/18 0006)   PRN Meds:.acetaminophen, traZODone   PHYSICAL EXAM: Vital signs: Vitals:   10/17/18 1857 10/17/18 1900 10/17/18 2102 10/17/18 2259  BP: 110/75   (!) 97/55  Pulse: 80   69  Resp: 16   20  Temp: 99.4 F (37.4 C)   98 F (36.7 C)  TempSrc: Oral   Oral  SpO2:  93%  93%  Weight:   89.2 kg   Height:   5\' 10"  (1.778 m)    Filed Weights   10/17/18 2102  Weight: 89.2 kg   Body mass index is 28.22 kg/m.   General appearance :Awake, alert, not in any distress.  HEENT: Atraumatic and Normocephalic Neck: supple, no JVD Resp:Good air entry bilaterally, no added sounds  CVS: S1 S2 irregular GI: Bowel sounds present, Non tender and not distended with no gaurding, rigidity or rebound.No organomegaly Extremities: B/L Lower Ext shows no edema, both legs are warm to touch Neurology:  speech clear,Non focal, sensation is grossly intact. Psychiatric: Normal judgment and insight. Alert and oriented x 3. Normal mood. Musculoskeletal:No digital cyanosis Skin:No Rash, warm and dry Wounds:N/A  I have personally reviewed following labs and imaging studies  LABORATORY DATA: CBC: Recent Labs  Lab 10/17/18 1500 10/18/18 0249  WBC 5.3 4.3  NEUTROABS 4.2 3.3  HGB 11.8* 11.1*  HCT 37.6* 34.7*  MCV 84.7 82.0  PLT 59* 60*    Basic Metabolic Panel: Recent Labs  Lab 10/17/18 1500 10/18/18 0249  NA 137 137  K 3.6 3.8  CL 99 100  CO2 25 26  GLUCOSE 148* 148*  BUN 16 20  CREATININE 1.54* 1.68*  CALCIUM 9.2 9.1    GFR: Estimated Creatinine Clearance: 49.3 mL/min (A) (by C-G formula based on SCr of 1.68 mg/dL (H)).  Liver Function Tests: Recent Labs  Lab 10/12/18 1247 10/17/18 1500 10/18/18 0249  AST 18 18 16   ALT 12 14 15   ALKPHOS 63 52 45  BILITOT 0.9 3.3* 2.7*  PROT 6.2 7.0 6.3*  ALBUMIN 3.9 3.4* 3.0*   No results for input(s): LIPASE, AMYLASE in the last  168 hours. No results for input(s): AMMONIA in the last 168 hours.  Coagulation Profile: Recent Labs  Lab 10/18/18 0249  INR 3.13    Cardiac Enzymes: No results for input(s): CKTOTAL, CKMB, CKMBINDEX, TROPONINI in the last 168 hours.  BNP (last 3 results) No results for input(s): PROBNP in the last 8760 hours.  HbA1C: No results for input(s): HGBA1C in the last 72 hours.  CBG: No results for input(s): GLUCAP in the last 168 hours.  Lipid Profile: No results for input(s): CHOL, HDL, LDLCALC, TRIG, CHOLHDL, LDLDIRECT in the last 72 hours.  Thyroid Function Tests: No results for input(s): TSH, T4TOTAL, FREET4, T3FREE, THYROIDAB in the last 72 hours.  Anemia Panel: No results for input(s): VITAMINB12, FOLATE, FERRITIN, TIBC, IRON, RETICCTPCT in the last 72 hours.  Urine analysis:    Component Value Date/Time   COLORURINE STRAW (A) 12/17/2016 1810   APPEARANCEUR CLEAR 12/17/2016 1810   LABSPEC 1.006 12/17/2016 1810   PHURINE 5.0 12/17/2016 1810   GLUCOSEU NEGATIVE 12/17/2016 1810   HGBUR SMALL (A) 12/17/2016 1810  BILIRUBINUR NEGATIVE 12/17/2016 1810   KETONESUR NEGATIVE 12/17/2016 1810   PROTEINUR NEGATIVE 12/17/2016 1810   NITRITE NEGATIVE 12/17/2016 1810   LEUKOCYTESUR NEGATIVE 12/17/2016 1810    Sepsis Labs: Lactic Acid, Venous    Component Value Date/Time   LATICACIDVEN 1.66 10/17/2018 1515    MICROBIOLOGY: Recent Results (from the past 240 hour(s))  Respiratory Panel by PCR     Status: Abnormal   Collection Time: 10/17/18  6:28 PM  Result Value Ref Range Status   Adenovirus NOT DETECTED NOT DETECTED Final   Coronavirus 229E NOT DETECTED NOT DETECTED Final   Coronavirus HKU1 NOT DETECTED NOT DETECTED Final   Coronavirus NL63 NOT DETECTED NOT DETECTED Final   Coronavirus OC43 NOT DETECTED NOT DETECTED Final   Metapneumovirus NOT DETECTED NOT DETECTED Final   Rhinovirus / Enterovirus NOT DETECTED NOT DETECTED Final   Influenza A NOT DETECTED NOT  DETECTED Final   Influenza B NOT DETECTED NOT DETECTED Final   Parainfluenza Virus 1 DETECTED (A) NOT DETECTED Final   Parainfluenza Virus 2 NOT DETECTED NOT DETECTED Final   Parainfluenza Virus 3 NOT DETECTED NOT DETECTED Final   Parainfluenza Virus 4 NOT DETECTED NOT DETECTED Final   Respiratory Syncytial Virus NOT DETECTED NOT DETECTED Final   Bordetella pertussis NOT DETECTED NOT DETECTED Final   Chlamydophila pneumoniae NOT DETECTED NOT DETECTED Final   Mycoplasma pneumoniae NOT DETECTED NOT DETECTED Final    Comment: Performed at [facility], [place] [address] [place], [place] [address]    RADIOLOGY STUDIES/RESULTS: Dg Chest 2 View  Result Date: 10/17/2018 CLINICAL DATA:  Cough.  Shortness of breath.  Sinus drainage. EXAM: CHEST - 2 VIEW COMPARISON:  09/13/2018. FINDINGS: Prior CABG. Cardiomegaly with pulmonary vascular prominence. Bilateral pulmonary interstitial prominence. A component of chronic interstitial lung disease most likely present. Interstitial edema and/or pneumonitis cannot be excluded. No prominent pleural effusion. Stable elevation right hemidiaphragm. No acute bony abnormality IMPRESSION: 1.  Cardiomegaly with pulmonary venous congestion. 2. Bilateral from interstitial prominence. A component of these interstitial changes are most likely chronic. A component of interstitial edema and/or pneumonitis cannot be excluded. Electronically Signed   By: Marcello Moores  Register   On: 10/17/2018 16:30     LOS: 1 day   Oren Binet, MD  [facility]  If 7PM-7AM, please contact night-coverage  Please page via [email or web address]-Password [email or web address]-click on MD name and type text message  10/18/2018, 7:28 AM

## 2018-10-19 DIAGNOSIS — I10 Essential (primary) hypertension: Secondary | ICD-10-CM

## 2018-10-19 LAB — CBC
HCT: 33.1 % — ABNORMAL LOW (ref 39.0–52.0)
Hemoglobin: 10.7 g/dL — ABNORMAL LOW (ref 13.0–17.0)
MCH: 26.6 pg (ref 26.0–34.0)
MCHC: 32.3 g/dL (ref 30.0–36.0)
MCV: 82.1 fL (ref 80.0–100.0)
NRBC: 0 % (ref 0.0–0.2)
Platelets: 67 10*3/uL — ABNORMAL LOW (ref 150–400)
RBC: 4.03 MIL/uL — ABNORMAL LOW (ref 4.22–5.81)
RDW: 15.7 % — ABNORMAL HIGH (ref 11.5–15.5)
WBC: 5.1 10*3/uL (ref 4.0–10.5)

## 2018-10-19 LAB — COMPREHENSIVE METABOLIC PANEL
ALT: 13 U/L (ref 0–44)
AST: 13 U/L — ABNORMAL LOW (ref 15–41)
Albumin: 3 g/dL — ABNORMAL LOW (ref 3.5–5.0)
Alkaline Phosphatase: 44 U/L (ref 38–126)
Anion gap: 13 (ref 5–15)
BILIRUBIN TOTAL: 1.5 mg/dL — AB (ref 0.3–1.2)
BUN: 21 mg/dL (ref 8–23)
CALCIUM: 9.1 mg/dL (ref 8.9–10.3)
CO2: 28 mmol/L (ref 22–32)
Chloride: 97 mmol/L — ABNORMAL LOW (ref 98–111)
Creatinine, Ser: 1.51 mg/dL — ABNORMAL HIGH (ref 0.61–1.24)
GFR calc Af Amer: 55 mL/min — ABNORMAL LOW (ref >60)
GFR calc non Af Amer: 48 mL/min — ABNORMAL LOW (ref >60)
Glucose, Bld: 184 mg/dL — ABNORMAL HIGH (ref 70–99)
Potassium: 3.1 mmol/L — ABNORMAL LOW (ref 3.5–5.1)
Sodium: 138 mmol/L (ref 135–145)
TOTAL PROTEIN: 6.2 g/dL — AB (ref 6.5–8.1)

## 2018-10-19 LAB — PROTIME-INR
INR: 2.7
Prothrombin Time: 28.3 seconds — ABNORMAL HIGH (ref 11.4–15.2)

## 2018-10-19 LAB — TACROLIMUS LEVEL: Tacrolimus (FK506) - LabCorp: 5.6 ng/mL (ref 2.0–20.0)

## 2018-10-19 MED ORDER — AMOXICILLIN-POT CLAVULANATE 500-125 MG PO TABS
1.0000 | ORAL_TABLET | Freq: Two times a day (BID) | ORAL | Status: DC
  Administered 2018-10-19: 500 mg via ORAL
  Filled 2018-10-19: qty 1

## 2018-10-19 MED ORDER — OXYMETAZOLINE HCL 0.05 % NA SOLN
1.0000 | Freq: Two times a day (BID) | NASAL | Status: DC
  Administered 2018-10-19: 1 via NASAL
  Filled 2018-10-19: qty 15

## 2018-10-19 MED ORDER — POTASSIUM CHLORIDE CRYS ER 20 MEQ PO TBCR: 40.0000 meq | EXTENDED_RELEASE_TABLET | Freq: Every day | ORAL | Status: DC

## 2018-10-19 MED ORDER — AMOXICILLIN-POT CLAVULANATE 500-125 MG PO TABS: 1.0000 | ORAL_TABLET | Freq: Two times a day (BID) | ORAL | 0 refills | Status: DC

## 2018-10-19 MED ORDER — FUROSEMIDE 40 MG PO TABS: 40.0000 mg | ORAL_TABLET | Freq: Two times a day (BID) | ORAL | Status: DC

## 2018-10-19 MED ORDER — WARFARIN SODIUM 4 MG PO TABS: 4.0000 mg | ORAL_TABLET | Freq: Once | ORAL | Status: DC

## 2018-10-19 MED ORDER — POTASSIUM CHLORIDE CRYS ER 20 MEQ PO TBCR
40.0000 meq | EXTENDED_RELEASE_TABLET | Freq: Once | ORAL | Status: AC
  Administered 2018-10-19: 40 meq via ORAL
  Filled 2018-10-19: qty 2

## 2018-10-19 NOTE — Evaluation (Signed)
Physical Therapy Evaluation Patient Details Name: [name] MRN: [id number] DOB: September 28, 1951 Today's Date: 10/19/2018   History of Present Illness  Patient is a 67 y.o. male with history of renal/liver transplant, chronic systolic heart failure, atrial fibrillation on anticoagulation, chronic thrombocytopenia-presenting with 3 days history of subjective fever, cough, nasal congestion and shortness of breath.      Clinical Impression  Patient evaluated by Physical Therapy with no further acute PT needs identified. All education has been completed and the patient has no further questions. Pt ambulating unit without AD, DOE 1/4, SpO2 90% with activity on RA, discussed short term strategies such as energy conservation and symptom monitoring for oxygen desaturation. Pt reports feeling weaker than baseline but wishes to focus on reconditioning without the assistance of physical therapy after medical d/c, feel this will be appropriate. See below for any follow-up Physical Therapy or equipment needs. PT is signing off. Thank you for this referral.     Follow Up Recommendations No PT follow up    Equipment Recommendations  None recommended by PT    Recommendations for Other Services       Precautions / Restrictions        Mobility  Bed Mobility Overal bed mobility: Independent                Transfers Overall transfer level: Independent                  Ambulation/Gait Ambulation/Gait assistance: Independent Gait Distance (Feet): 500 Feet Assistive device: None       General Gait Details: ambulating without difficulty, SpO2 90% on RA, discussed rest breaks and what symptoms to mointor for. no questions at thsi time  Financial trader Rank[employer] (Stroke Patients Only)       Balance Overall balance assessment: Independent                                           Pertinent Vitals/Pain Pain  Assessment: No/denies pain    Home Living Family/patient expects to be discharged to:: Private residence Living Arrangements: Alone Available Help at Discharge: Family;Available PRN/intermittently Type of Home: House Home Access: Stairs to enter Entrance Stairs-Rails: Left Entrance Stairs-Number of Steps: 7-8 Home Layout: One level        Prior Function Level of Independence: Independent               Hand Dominance        Extremity/Trunk Assessment   Upper Extremity Assessment Upper Extremity Assessment: Overall WFL for tasks assessed    Lower Extremity Assessment Lower Extremity Assessment: Overall WFL for tasks assessed       Communication   Communication: No difficulties  Cognition Arousal/Alertness: Awake/alert Behavior During Therapy: WFL for tasks assessed/performed Overall Cognitive Status: Within Functional Limits for tasks assessed                                        General Comments      Exercises     Assessment/Plan    PT Assessment Patent does not need any further PT services  PT Problem List         PT Treatment Interventions  PT Goals (Current goals can be found in the Care Plan section)  Acute Rehab PT Goals Patient Stated Goal: go home PT Goal Formulation: With patient    Frequency     Barriers to discharge        Co-evaluation               AM-PAC PT "6 Clicks" Mobility  Outcome Measure Help needed turning from your back to your side while in a flat bed without using bedrails?: None Help needed moving from lying on your back to sitting on the side of a flat bed without using bedrails?: None Help needed moving to and from a bed to a chair (including a wheelchair)?: None Help needed standing up from a chair using your arms (e.g., wheelchair or bedside chair)?: None Help needed to walk in hospital room?: None Help needed climbing 3-5 steps with a railing? : None 6 Click Score: 24    End of  Session Equipment Utilized During Treatment: Gait belt Activity Tolerance: Patient tolerated treatment well Patient left: in bed;with call bell/phone within reach Nurse Communication: Mobility status      Time: 1030-1054 PT Time Calculation (min) (ACUTE ONLY): 24 min   Charges:   PT Evaluation $PT Eval Low Complexity: 1 Low PT Treatments $Gait Training: 8-22 mins        Reinaldo Berber, PT, DPT Acute Rehabilitation Services Pager: [phone] Office: [place] 10/19/2018, 12:41 PM

## 2018-10-19 NOTE — Progress Notes (Signed)
ANTICOAGULATION CONSULT NOTE - Initial Consult  Pharmacy Consult for Coumadin Indication: atrial fibrillation  Allergies  Allergen Reactions  . Other Anaphylaxis    Spider venom  . Grapefruit Concentrate Other (See Comments)    Pt has been told not to eat grapefruit  . Haloperidol Other (See Comments)    Caused the patient to be overly-sedated  . Nsaids Other (See Comments)    Cannot take due to liver transplant  . Rifampin Other (See Comments)    Caused flushing  . Triazolam Other (See Comments)    Caused the patient to be overly-sedated    Patient Measurements: Height: 5\' 10"  (177.8 cm) Weight: 196 lb 10.4 oz (89.2 kg) IBW/kg (Calculated) : 73  Vital Signs: Temp: 97.3 F (36.3 C) (12/12 0737) Temp Source: Oral (12/12 0737) BP: 116/63 (12/12 0737) Pulse Rate: 71 (12/12 0737)  Labs: Recent Labs    10/17/18 1500 10/18/18 0249 10/19/18 0332  HGB 11.8* 11.1* 10.7*  HCT 37.6* 34.7* 33.1*  PLT 59* 60* 67*  LABPROT  --  31.7* 28.3*  INR  --  3.13 2.70  CREATININE 1.54* 1.68* 1.51*    Estimated Creatinine Clearance: 54.8 mL/min (A) (by C-G formula based on SCr of 1.51 mg/dL (H)).   Medical History: Past Medical History:  Diagnosis Date  . Atrial fibrillation ([place]) 08/2009  . CAD (coronary artery disease)    s/p CABG -- 1992  . ESRD (end stage renal disease) ([place])   . HLD (hyperlipidemia)   . HTN (hypertension)   . Idiopathic thrombocytopenia ([facility])     Medications:  Medications Prior to Admission  Medication Sig Dispense Refill Last Dose  . acetaminophen (TYLENOL) 500 MG tablet Take 1,000 mg by mouth every 6 (six) hours as needed for mild pain or headache.    Unk at [facility]  . allopurinol (ZYLOPRIM) 100 MG tablet Take 100 mg by mouth daily.   10/17/2018 at 1100  . amiodarone (PACERONE) 200 MG tablet TAKE 1 TABLET ONCE DAILY. (Patient taking differently: Take 200 mg by mouth daily. ) 30 tablet 11 10/17/2018 at 1100  . atorvastatin (LIPITOR) 20 MG tablet Take 20  mg by mouth at bedtime.    10/16/2018 at pm  . calcitRIOL (ROCALTROL) 0.25 MCG capsule Take 1 capsule by mouth every Monday, Wednesday, and Friday.    10/17/2018 at am  . carvedilol (COREG) 25 MG tablet Take 1 tablet (25 mg total) by mouth 2 (two) times daily with a meal. 60 tablet 2 10/17/2018 at 1100  . Cholecalciferol (VITAMIN D3) 25 MCG (1000 UT) CAPS Take 1,000 Units by mouth daily.    10/17/2018 at Unknown time  . Cyanocobalamin (VITAMIN B12) 500 MCG TABS Take 500 mcg by mouth daily.    10/17/2018 at Unknown time  . DULoxetine (CYMBALTA) 60 MG capsule Take 60 mg by mouth at bedtime.   10/16/2018 at pm  . EPINEPHrine (EPIPEN) 0.3 mg/0.3 mL DEVI Inject 0.3 mg into the muscle once as needed (severe allergic reaction).    2012 at 2012  . ferrous sulfate 325 (65 FE) MG tablet Take 325 mg by mouth daily with breakfast.   10/17/2018 at am  . fish oil-omega-3 fatty acids 1000 MG capsule Take 2 g by mouth daily.    10/17/2018 at am  . fluticasone (FLONASE) 50 MCG/ACT nasal spray Place 2 sprays into both nostrils daily as needed for allergies or rhinitis.    Unk at [facility]  . furosemide (LASIX) 40 MG tablet Take 40 mg by  mouth 2 (two) times daily.   10/17/2018 at am  . Melatonin 1 MG TABS Take 1 mg by mouth at bedtime.   10/16/2018 at pm  . metolazone (ZAROXOLYN) 5 MG tablet Take 0.5 tablets by mouth 2 (two) times a week.    10/17/2018 at am  . nitroGLYCERIN (NITROSTAT) 0.4 MG SL tablet Place 0.4 mg under the tongue every 5 (five) minutes as needed for chest pain.   Unk at [facility]  . potassium chloride (K-DUR,KLOR-CON) 20 MEQ tablet Take 2 tablets (40 mEq total) by mouth daily. 30 tablet 1 10/17/2018 at am  . predniSONE (DELTASONE) 5 MG tablet Take 1 tablet (5 mg total) by mouth daily.   10/17/2018 at am  . QUEtiapine (SEROQUEL) 100 MG tablet Take 100 mg by mouth at bedtime.   10/16/2018 at pm  . tacrolimus (PROGRAF) 0.5 MG capsule Take 0.5 mg by mouth 2 (two) times daily.    10/17/2018 at 1100  . traZODone  (DESYREL) 150 MG tablet Take 150 mg by mouth at bedtime as needed for sleep.    Unk at [facility]  . warfarin (COUMADIN) 4 MG tablet TAKE AS DIRECTED BY COUMADIN CLINIC. (Patient taking differently: Take 4 mg by mouth at bedtime. ) 40 tablet 2 10/16/2018 at 2300  . albuterol (PROVENTIL) (2.5 MG/3ML) 0.083% nebulizer solution Take 2.5 mg by nebulization every 2 (two) hours as needed for wheezing or shortness of breath.   Not Taking at Unknown time    Assessment: 67 y.o. male admitted with SOB/PNA, h/o AFib, to continue Coumadin.  INR therapeutic this morning at 2.7. No bleeding noted  Goal of Therapy:  INR 2-3 Monitor platelets by anticoagulation protocol: Yes   Plan:  Coumadin 4mg  PO x 1 Daily INR  Dennette Faulconer A. Levada Dy, PharmD, [place] Pager: [phone] Please utilize Amion for appropriate phone number to reach the unit pharmacist ([place])   10/19/2018,7:40 AM

## 2018-10-19 NOTE — Discharge Summary (Signed)
PATIENT DETAILS Name: [name] Age: 67 y.o. Sex: male Date of Birth: 03-13-1951 MRN: [id number]. Admitting Physician: Nita Sells, MD [id number], Marsh Dolly., MD  Admit Date: 10/17/2018 Discharge date: 10/19/2018  Recommendations for Outpatient Follow-up:  1. Follow up with PCP in 1-2 weeks 2. Please obtain BMP/CBC in one week 3. Please follow up on the following pending results:Progaf level pending  Admitted From:  Home  Disposition: [place]: No  Equipment/Devices: None  Discharge Condition: Stable  CODE STATUS: FULL CODE  Diet recommendation:  Heart Healthy  Brief Summary: See H&P, Labs, Consult and Test reports for all details in brief, Patient is a 67 y.o. male with history of renal/liver transplant, chronic systolic heart failure, atrial fibrillation on anticoagulation, chronic thrombocytopenia-presenting with 3 days history of subjective fever, cough, nasal congestion and shortness of breath.  Thought to have pneumonia on chest x-ray and admitted to the hospitalist service.  See below for further details  Brief Hospital Course: Community-acquired pneumonia:  Probably a viral pneumonitis-respiratory virus panel positive for parainfluenza virus.  Since immunocompromised-was continued on Rocephin and Zithromax.  Blood cultures negative so far.  Urine pneumococcal antigen negative.  Clinically improved-claims feels much better-has ambulated in the hallway without any shortness of breath.  He thinks he is very close to his usual baseline-and is actually requesting discharge home.  He is afebrile.  I think given his immunocompromise status-continue Augmentin for few more days is reasonable.  Spoke with pharmacy-Augmentin dosage has been adjusted to 500 mg twice daily.  AKI on CKD stage III: Creatinine slightly elevated on admission but now has down trended down-and close to her usual baseline.  Will resume diuretics on discharge.  Continue  immunosuppressants-awaiting Prograf levels-we will need to be followed by PCP or nephrology  Renal/liver transplantation: Continue to monitor renal function, LFTs reasonably stable-bilirubin downtrending-almost normalized-continue Prograf, prednisone  History of ventricular tachycardia: Continue amiodarone and Coreg  History of atrial fibrillation:  Appears to have chronic A. fib-telemetry stable with atrial fibrillation.  Continue amiodarone, Coreg.  INR therapeutic-continue Coumadin.    Chronic systolic heart failure: Volume status is stable-diuretics were briefly held-but will resume on discharge.  [name]  History of CAD-status post CABG: No anginal symptoms-follow.  Thrombocytopenia: Appears to be chronic-and close to his usual baseline.  Bipolar disorder: Stable-continue Cymbalta, Seroquel and trazodone  Procedures/Studies: None  Discharge Diagnoses:  Active Problems:   Long term (current) use of anticoagulants [Z79.01]   HTN (hypertension)   HLD (hyperlipidemia)   Gout   Chronic systolic CHF (congestive heart failure) ([place])   History of liver transplant ([place])   CAP (community acquired pneumonia)   Discharge Instructions:  Activity:  As tolerated with Full fall precautions use walker/cane & assistance as needed   Discharge Instructions    Call MD for:  difficulty breathing, headache or visual disturbances   Complete by:  As directed    Call MD for:  temperature >100.4   Complete by:  As directed    Diet - low sodium heart healthy   Complete by:  As directed    Discharge instructions   Complete by:  As directed    Follow with Primary MD  Spry, Marsh Dolly., MD  And Dr Lorrene Reid in 1 week  Please get a complete blood count and chemistry panel checked by your Primary MD at your next visit, and again as instructed by your Primary MD.  Get Medicines reviewed and adjusted: Please take all your medications with  you for your next visit with your Primary  MD  Laboratory/radiological data: Please request your Primary MD to go over all hospital tests and procedure/radiological results at the follow up, please ask your Primary MD to get all Hospital records sent to his/her office.  In some cases, they will be blood work, cultures and biopsy results pending at the time of your discharge. Please request that your primary care M.D. follows up on these results.  Also Note the following: If you experience worsening of your admission symptoms, develop shortness of breath, life threatening emergency, suicidal or homicidal thoughts you must seek medical attention immediately by calling 911 or calling your MD immediately  if symptoms less severe.  You must read complete instructions/literature along with all the possible adverse reactions/side effects for all the Medicines you take and that have been prescribed to you. Take any new Medicines after you have completely understood and accpet all the possible adverse reactions/side effects.   Do not drive when taking Pain medications or sleeping medications (Benzodaizepines)  Do not take more than prescribed Pain, Sleep and Anxiety Medications. It is not advisable to combine anxiety,sleep and pain medications without talking with your primary care practitioner  Special Instructions: If you have smoked or chewed Tobacco  in the last 2 yrs please stop smoking, stop any regular Alcohol  and or any Recreational drug use.  Wear Seat belts while driving.  Please note: You were cared for by a hospitalist during your hospital stay. Once you are discharged, your primary care physician will handle any further medical issues. Please note that NO REFILLS for any discharge medications will be authorized once you are discharged, as it is imperative that you return to your primary care physician (or establish a relationship with a primary care physician if you do not have one) for your post hospital discharge needs so that  they can reassess your need for medications and monitor your lab values.   Increase activity slowly   Complete by:  As directed      Allergies as of 10/19/2018      Reactions   Other Anaphylaxis   Spider venom   Grapefruit Concentrate Other (See Comments)   Pt has been told not to eat grapefruit   Haloperidol Other (See Comments)   Caused the patient to be overly-sedated   Nsaids Other (See Comments)   Cannot take due to liver transplant   Rifampin Other (See Comments)   Caused flushing   Triazolam Other (See Comments)   Caused the patient to be overly-sedated      Medication List    TAKE these medications   acetaminophen 500 MG tablet Commonly known as:  TYLENOL Take 1,000 mg by mouth every 6 (six) hours as needed for mild pain or headache.   albuterol (2.5 MG/3ML) 0.083% nebulizer solution Commonly known as:  PROVENTIL Take 2.5 mg by nebulization every 2 (two) hours as needed for wheezing or shortness of breath.   allopurinol 100 MG tablet Commonly known as:  ZYLOPRIM Take 100 mg by mouth daily.   amiodarone 200 MG tablet Commonly known as:  PACERONE TAKE 1 TABLET ONCE DAILY.   amoxicillin-clavulanate 500-125 MG tablet Commonly known as:  AUGMENTIN Take 1 tablet (500 mg total) by mouth 2 (two) times daily.   atorvastatin 20 MG tablet Commonly known as:  LIPITOR Take 20 mg by mouth at bedtime.   calcitRIOL 0.25 MCG capsule Commonly known as:  ROCALTROL Take 1 capsule by mouth every Monday,  Wednesday, and Friday.   carvedilol 25 MG tablet Commonly known as:  COREG Take 1 tablet (25 mg total) by mouth 2 (two) times daily with a meal.   DULoxetine 60 MG capsule Commonly known as:  CYMBALTA Take 60 mg by mouth at bedtime.   EPIPEN 0.3 mg/0.3 mL Devi Generic drug:  EPINEPHrine Inject 0.3 mg into the muscle once as needed (severe allergic reaction).   ferrous sulfate 325 (65 FE) MG tablet Take 325 mg by mouth daily with breakfast.   fish oil-omega-3 fatty  acids 1000 MG capsule Take 2 g by mouth daily.   fluticasone 50 MCG/ACT nasal spray Commonly known as:  FLONASE Place 2 sprays into both nostrils daily as needed for allergies or rhinitis.   furosemide 40 MG tablet Commonly known as:  LASIX Take 40 mg by mouth 2 (two) times daily.   Melatonin 1 MG Tabs Take 1 mg by mouth at bedtime.   metolazone 5 MG tablet Commonly known as:  ZAROXOLYN Take 0.5 tablets by mouth 2 (two) times a week.   nitroGLYCERIN 0.4 MG SL tablet Commonly known as:  NITROSTAT Place 0.4 mg under the tongue every 5 (five) minutes as needed for chest pain.   potassium chloride SA 20 MEQ tablet Commonly known as:  K-DUR,KLOR-CON Take 2 tablets (40 mEq total) by mouth daily.   predniSONE 5 MG tablet Commonly known as:  DELTASONE Take 1 tablet (5 mg total) by mouth daily.   QUEtiapine 100 MG tablet Commonly known as:  SEROQUEL Take 100 mg by mouth at bedtime.   tacrolimus 0.5 MG capsule Commonly known as:  PROGRAF Take 0.5 mg by mouth 2 (two) times daily.   traZODone 150 MG tablet Commonly known as:  DESYREL Take 150 mg by mouth at bedtime as needed for sleep.   Vitamin B12 500 MCG Tabs Take 500 mcg by mouth daily.   Vitamin D3 25 MCG (1000 UT) Caps Take 1,000 Units by mouth daily.   warfarin 4 MG tablet Commonly known as:  COUMADIN Take as directed. If you are unsure how to take this medication, talk to your nurse or doctor. Original instructions:  TAKE AS DIRECTED BY COUMADIN CLINIC. What changed:  See the new instructions.      Follow-up Information    Spry, Marsh Dolly., MD. Schedule an appointment as soon as possible for a visit in 1 week(s).   Specialty:  Family Medicine Contact information: [address] [place] [address] [phone]        Jamal Maes, MD. Schedule an appointment as soon as possible for a visit in 1 week(s).   Specialty:  Nephrology Contact information: [address] [place]  [address] [phone]          Allergies  Allergen Reactions  . Other Anaphylaxis    Spider venom  . Grapefruit Concentrate Other (See Comments)    Pt has been told not to eat grapefruit  . Haloperidol Other (See Comments)    Caused the patient to be overly-sedated  . Nsaids Other (See Comments)    Cannot take due to liver transplant  . Rifampin Other (See Comments)    Caused flushing  . Triazolam Other (See Comments)    Caused the patient to be overly-sedated    Consultations:   None   Other Procedures/Studies: Dg Chest 2 View  Result Date: 10/17/2018 CLINICAL DATA:  Cough.  Shortness of breath.  Sinus drainage. EXAM: CHEST - 2 VIEW COMPARISON:  09/13/2018. FINDINGS:  Prior CABG. Cardiomegaly with pulmonary vascular prominence. Bilateral pulmonary interstitial prominence. A component of chronic interstitial lung disease most likely present. Interstitial edema and/or pneumonitis cannot be excluded. No prominent pleural effusion. Stable elevation right hemidiaphragm. No acute bony abnormality IMPRESSION: 1.  Cardiomegaly with pulmonary venous congestion. 2. Bilateral from interstitial prominence. A component of these interstitial changes are most likely chronic. A component of interstitial edema and/or pneumonitis cannot be excluded. Electronically Signed   By: Marcello Moores  Register   On: 10/17/2018 16:30      TODAY-DAY OF DISCHARGE:  Subjective:   [name] today has no headache,no chest abdominal pain,no new weakness tingling or numbness, feels much better wants to go home today.   Objective:   Blood pressure 116/63, pulse 71, temperature (!) 97.3 F (36.3 C), temperature source Oral, resp. rate 12, height 5\' 10"  (1.778 m), weight 89.2 kg, SpO2 94 %.  Intake/Output Summary (Last 24 hours) at 10/19/2018 1000 Last data filed at 10/18/2018 1828 Gross per 24 hour  Intake 240 ml  Output 650 ml  Net -410 ml   Filed Weights   10/17/18 2102  Weight: 89.2 kg     Exam: Awake Alert, Oriented *3, No new F.N deficits, Normal affect .AT,PERRAL Supple Neck,No JVD, No cervical lymphadenopathy appriciated.  Symmetrical Chest wall movement, Good air movement bilaterally, CTAB RRR,No Gallops,Rubs or new Murmurs, No Parasternal Heave +ve B.Sounds, Abd Soft, Non tender, No organomegaly appriciated, No rebound -guarding or rigidity. No Cyanosis, Clubbing or edema, No new Rash or bruise   PERTINENT RADIOLOGIC STUDIES: Dg Chest 2 View  Result Date: 10/17/2018 CLINICAL DATA:  Cough.  Shortness of breath.  Sinus drainage. EXAM: CHEST - 2 VIEW COMPARISON:  09/13/2018. FINDINGS: Prior CABG. Cardiomegaly with pulmonary vascular prominence. Bilateral pulmonary interstitial prominence. A component of chronic interstitial lung disease most likely present. Interstitial edema and/or pneumonitis cannot be excluded. No prominent pleural effusion. Stable elevation right hemidiaphragm. No acute bony abnormality IMPRESSION: 1.  Cardiomegaly with pulmonary venous congestion. 2. Bilateral from interstitial prominence. A component of these interstitial changes are most likely chronic. A component of interstitial edema and/or pneumonitis cannot be excluded. Electronically Signed   By: Marcello Moores  Register   On: 10/17/2018 16:30     PERTINENT LAB RESULTS: CBC: Recent Labs    10/18/18 0249 10/19/18 0332  WBC 4.3 5.1  HGB 11.1* 10.7*  HCT 34.7* 33.1*  PLT 60* 67*   CMET CMP     Component Value Date/Time   NA 138 10/19/2018 0332   K 3.1 (L) 10/19/2018 0332   CL 97 (L) 10/19/2018 0332   CO2 28 10/19/2018 0332   GLUCOSE 184 (H) 10/19/2018 0332   BUN 21 10/19/2018 0332   CREATININE 1.51 (H) 10/19/2018 0332   CALCIUM 9.1 10/19/2018 0332   PROT 6.2 (L) 10/19/2018 0332   PROT 6.2 10/12/2018 1247   ALBUMIN 3.0 (L) 10/19/2018 0332   ALBUMIN 3.9 10/12/2018 1247   AST 13 (L) 10/19/2018 0332   ALT 13 10/19/2018 0332   ALKPHOS 44 10/19/2018 0332   BILITOT 1.5 (H)  10/19/2018 0332   BILITOT 0.9 10/12/2018 1247   GFRNONAA 48 (L) 10/19/2018 0332   GFRAA 55 (L) 10/19/2018 0332    GFR Estimated Creatinine Clearance: 54.8 mL/min (A) (by C-G formula based on SCr of 1.51 mg/dL (H)). No results for input(s): LIPASE, AMYLASE in the last 72 hours. No results for input(s): CKTOTAL, CKMB, CKMBINDEX, TROPONINI in the last 72 hours. Invalid input(s): POCBNP No results for input(s):  DDIMER in the last 72 hours. No results for input(s): HGBA1C in the last 72 hours. No results for input(s): CHOL, HDL, LDLCALC, TRIG, CHOLHDL, LDLDIRECT in the last 72 hours. No results for input(s): TSH, T4TOTAL, T3FREE, THYROIDAB in the last 72 hours.  Invalid input(s): FREET3 No results for input(s): VITAMINB12, FOLATE, FERRITIN, TIBC, IRON, RETICCTPCT in the last 72 hours. Coags: Recent Labs    10/18/18 0249 10/19/18 0332  INR 3.13 2.70   Microbiology: Recent Results (from the past 240 hour(s))  Respiratory Panel by PCR     Status: Abnormal   Collection Time: 10/17/18  6:28 PM  Result Value Ref Range Status   Adenovirus NOT DETECTED NOT DETECTED Final   Coronavirus 229E NOT DETECTED NOT DETECTED Final   Coronavirus HKU1 NOT DETECTED NOT DETECTED Final   Coronavirus NL63 NOT DETECTED NOT DETECTED Final   Coronavirus OC43 NOT DETECTED NOT DETECTED Final   Metapneumovirus NOT DETECTED NOT DETECTED Final   Rhinovirus / Enterovirus NOT DETECTED NOT DETECTED Final   Influenza A NOT DETECTED NOT DETECTED Final   Influenza B NOT DETECTED NOT DETECTED Final   Parainfluenza Virus 1 DETECTED (A) NOT DETECTED Final   Parainfluenza Virus 2 NOT DETECTED NOT DETECTED Final   Parainfluenza Virus 3 NOT DETECTED NOT DETECTED Final   Parainfluenza Virus 4 NOT DETECTED NOT DETECTED Final   Respiratory Syncytial Virus NOT DETECTED NOT DETECTED Final   Bordetella pertussis NOT DETECTED NOT DETECTED Final   Chlamydophila pneumoniae NOT DETECTED NOT DETECTED Final   Mycoplasma pneumoniae  NOT DETECTED NOT DETECTED Final    Comment: Performed at [facility], [place] [address], [place], [place] [address]  Culture, blood (routine x 2) Call MD if unable to obtain prior to antibiotics being given     Status: None (Preliminary result)   Collection Time: 10/17/18  7:45 PM  Result Value Ref Range Status   Specimen Description BLOOD LEFT ANTECUBITAL  Final   Special Requests   Final    BOTTLES DRAWN AEROBIC ONLY Blood Culture adequate volume   Culture   Final    NO GROWTH < 24 HOURS Performed at [facility], [address], [place], [place] [address]    Report Status PENDING  Incomplete  Culture, blood (routine x 2) Call MD if unable to obtain prior to antibiotics being given     Status: None (Preliminary result)   Collection Time: 10/17/18  7:50 PM  Result Value Ref Range Status   Specimen Description BLOOD LEFT ANTECUBITAL  Final   Special Requests   Final    BOTTLES DRAWN AEROBIC ONLY Blood Culture adequate volume   Culture   Final    NO GROWTH < 24 HOURS Performed at [facility], [address] [place],  [address]    Report Status PENDING  Incomplete    FURTHER DISCHARGE INSTRUCTIONS:  Get Medicines reviewed and adjusted: Please take all your medications with you for your next visit with your Primary MD  Laboratory/radiological data: Please request your Primary MD to go over all hospital tests and procedure/radiological results at the follow up, please ask your Primary MD to get all Hospital records sent to his/her office.  In some cases, they will be blood work, cultures and biopsy results pending at the time of your discharge. Please request that your primary care M.D. goes through all the records of your hospital data and follows up on these results.  Also Note the following: If you  experience worsening of your admission symptoms, develop shortness of breath, life threatening emergency, suicidal or homicidal thoughts you must seek medical  attention immediately by calling 911 or calling your MD immediately  if symptoms less severe.  You must read complete instructions/literature along with all the possible adverse reactions/side effects for all the Medicines you take and that have been prescribed to you. Take any new Medicines after you have completely understood and accpet all the possible adverse reactions/side effects.   Do not drive when taking Pain medications or sleeping medications (Benzodaizepines)  Do not take more than prescribed Pain, Sleep and Anxiety Medications. It is not advisable to combine anxiety,sleep and pain medications without talking with your primary care practitioner  Special Instructions: If you have smoked or chewed Tobacco  in the last 2 yrs please stop smoking, stop any regular Alcohol  and or any Recreational drug use.  Wear Seat belts while driving.  Please note: You were cared for by a hospitalist during your hospital stay. Once you are discharged, your primary care physician will handle any further medical issues. Please note that NO REFILLS for any discharge medications will be authorized once you are discharged, as it is imperative that you return to your primary care physician (or establish a relationship with a primary care physician if you do not have one) for your post hospital discharge needs so that they can reassess your need for medications and monitor your lab values.  Total Time spent coordinating discharge including counseling, education and face to face time equals 45 minutes.  Signed: Shanker Ghimire 10/19/2018 10:00 AM

## 2018-10-19 NOTE — Progress Notes (Signed)
Discharge instructions given. Pt verbalized understanding and all questions were answered.  

## 2018-10-24 LAB — CULTURE, BLOOD (ROUTINE X 2)
Culture: NO GROWTH
Culture: NO GROWTH
Special Requests: ADEQUATE
Special Requests: ADEQUATE

## 2018-10-25 LAB — POCT INR: INR: 4.6 — AB (ref 2.0–3.0)

## 2018-10-26 ENCOUNTER — Ambulatory Visit (INDEPENDENT_AMBULATORY_CARE_PROVIDER_SITE_OTHER): Payer: Medicare Other | Admitting: Cardiovascular Disease

## 2018-10-26 DIAGNOSIS — Z5181 Encounter for therapeutic drug level monitoring: Secondary | ICD-10-CM | POA: Diagnosis not present

## 2018-10-26 DIAGNOSIS — Z7901 Long term (current) use of anticoagulants: Secondary | ICD-10-CM | POA: Diagnosis not present

## 2018-11-02 LAB — POCT INR: INR: 5.1 — AB (ref 2.0–3.0)

## 2018-11-03 ENCOUNTER — Ambulatory Visit (INDEPENDENT_AMBULATORY_CARE_PROVIDER_SITE_OTHER): Payer: Medicare Other | Admitting: Cardiovascular Disease

## 2018-11-03 DIAGNOSIS — Z5181 Encounter for therapeutic drug level monitoring: Secondary | ICD-10-CM | POA: Diagnosis not present

## 2018-11-03 DIAGNOSIS — Z7901 Long term (current) use of anticoagulants: Secondary | ICD-10-CM

## 2018-11-09 LAB — POCT INR: INR: 3.7 — AB (ref 2.0–3.0)

## 2018-11-10 ENCOUNTER — Ambulatory Visit (INDEPENDENT_AMBULATORY_CARE_PROVIDER_SITE_OTHER): Payer: Medicare Other

## 2018-11-10 DIAGNOSIS — Z5181 Encounter for therapeutic drug level monitoring: Secondary | ICD-10-CM | POA: Diagnosis not present

## 2018-11-10 DIAGNOSIS — Z7901 Long term (current) use of anticoagulants: Secondary | ICD-10-CM | POA: Diagnosis not present

## 2018-11-10 NOTE — Patient Instructions (Signed)
Description   Amiodarone started 08/09/18 @discharge . Spoke with pt and instructed pt to skip his Coumadin today, then start taking 2mg  daily except 4mg  on Mondays, Wednesdays, and Fridays.   Recheck INR in 1 week for insurance purposes. 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

## 2018-11-13 ENCOUNTER — Encounter (INDEPENDENT_AMBULATORY_CARE_PROVIDER_SITE_OTHER): Payer: Self-pay

## 2018-11-13 ENCOUNTER — Ambulatory Visit (INDEPENDENT_AMBULATORY_CARE_PROVIDER_SITE_OTHER): Payer: Medicare Other | Admitting: Nurse Practitioner

## 2018-11-13 ENCOUNTER — Other Ambulatory Visit: Payer: Self-pay

## 2018-11-13 ENCOUNTER — Encounter: Payer: Self-pay | Admitting: Nurse Practitioner

## 2018-11-13 ENCOUNTER — Other Ambulatory Visit: Payer: Medicare Other | Admitting: *Deleted

## 2018-11-13 ENCOUNTER — Other Ambulatory Visit (HOSPITAL_COMMUNITY): Payer: Self-pay | Admitting: Cardiology

## 2018-11-13 ENCOUNTER — Ambulatory Visit (HOSPITAL_COMMUNITY): Payer: Medicare Other | Attending: Cardiology

## 2018-11-13 VITALS — BP 110/60 | HR 66 | Ht 70.0 in | Wt 198.0 lb

## 2018-11-13 DIAGNOSIS — Z7901 Long term (current) use of anticoagulants: Secondary | ICD-10-CM | POA: Insufficient documentation

## 2018-11-13 DIAGNOSIS — I10 Essential (primary) hypertension: Secondary | ICD-10-CM | POA: Diagnosis not present

## 2018-11-13 DIAGNOSIS — I472 Ventricular tachycardia, unspecified: Secondary | ICD-10-CM

## 2018-11-13 DIAGNOSIS — I25810 Atherosclerosis of coronary artery bypass graft(s) without angina pectoris: Secondary | ICD-10-CM | POA: Diagnosis not present

## 2018-11-13 DIAGNOSIS — I4821 Permanent atrial fibrillation: Secondary | ICD-10-CM

## 2018-11-13 DIAGNOSIS — I480 Paroxysmal atrial fibrillation: Secondary | ICD-10-CM | POA: Diagnosis present

## 2018-11-13 DIAGNOSIS — N179 Acute kidney failure, unspecified: Secondary | ICD-10-CM

## 2018-11-13 NOTE — Progress Notes (Signed)
Electrophysiology Office Note Date: 11/13/2018  ID:  Erie, Radu [birth year], MRN [id number]  PCP: [name]., MD Electrophysiologist: [name]  CC: AF/VT follow up  [name] is a 68 y.o. male seen today for Dr [name].  He presents today for routine electrophysiology followup.  Since last being seen in our clinic, the patient reports doing relatively well.   He continues to struggle with shortness of breath. He was also admitted since being seen by Dr [name] for PNA.  Lab work by his PCP last week demonstrated AKI as well as hypokalemia for which he was supplemented. He is due for repeat labs. He denies chest pain, palpitations, PND, orthopnea, dizziness, syncope, edema, weight gain.  Past Medical History:  Diagnosis Date  . Atrial fibrillation ([name]) 08/2009  . CAD (coronary artery disease)    s/p CABG -- 1992  . ESRD (end stage renal disease) ([place])   . HLD (hyperlipidemia)   . HTN (hypertension)   . Idiopathic thrombocytopenia ([facility])    Past Surgical History:  Procedure Laterality Date  . APPENDECTOMY    . CORONARY ARTERY BYPASS GRAFT    . FACIAL COSMETIC SURGERY    . GALLBLADDER SURGERY    . HERNIA REPAIR    . KIDNEY TRANSPLANT    . LEFT HEART CATH AND CORONARY ANGIOGRAPHY N/A 08/07/2018   Procedure: LEFT HEART CATH AND CORONARY ANGIOGRAPHY;  Surgeon: Sherren Mocha, MD;  Location: [facility];  Service: Cardiovascular;  Laterality: N/A;  . LIVER TRANSPLANT      Current Outpatient Medications  Medication Sig Dispense Refill  . acetaminophen (TYLENOL) 500 MG tablet Take 1,000 mg by mouth every 6 (six) hours as needed for mild pain or headache.     . albuterol (PROVENTIL) (2.5 MG/3ML) 0.083% nebulizer solution Take 2.5 mg by nebulization every 2 (two) hours as needed for wheezing or shortness of breath.    . allopurinol (ZYLOPRIM) 100 MG tablet Take 100 mg by mouth daily.    [name] amiodarone (PACERONE) 200 MG tablet TAKE 1 TABLET ONCE DAILY. 30  tablet 11  . atorvastatin (LIPITOR) 20 MG tablet Take 20 mg by mouth at bedtime.     . calcitRIOL (ROCALTROL) 0.25 MCG capsule Take 1 capsule by mouth every Monday, Wednesday, and Friday.     . carvedilol (COREG) 25 MG tablet Take 1 tablet (25 mg total) by mouth 2 (two) times daily with a meal. 60 tablet 2  . Cholecalciferol (VITAMIN D3) 25 MCG (1000 UT) CAPS Take 1,000 Units by mouth daily.     . Cyanocobalamin (VITAMIN B12) 500 MCG TABS Take 500 mcg by mouth daily.     . DULoxetine (CYMBALTA) 60 MG capsule Take 60 mg by mouth at bedtime.    [name] EPINEPHrine (EPIPEN) 0.3 mg/0.3 mL DEVI Inject 0.3 mg into the muscle once as needed (severe allergic reaction).     . ferrous sulfate 325 (65 FE) MG tablet Take 325 mg by mouth daily with breakfast.    . fish oil-omega-3 fatty acids 1000 MG capsule Take 2 g by mouth daily.     . fluticasone (FLONASE) 50 MCG/ACT nasal spray Place 2 sprays into both nostrils daily as needed for allergies or rhinitis.     . furosemide (LASIX) 40 MG tablet Take 40 mg by mouth 2 (two) times daily.    . Melatonin 1 MG TABS Take 1 mg by mouth at bedtime.    . metolazone (ZAROXOLYN) 5 MG tablet  Take 0.5 tablets by mouth once a week.     . nitroGLYCERIN (NITROSTAT) 0.4 MG SL tablet Place 0.4 mg under the tongue every 5 (five) minutes as needed for chest pain.    . potassium chloride (K-DUR,KLOR-CON) 20 MEQ tablet Take 2 tablets (40 mEq total) by mouth daily. 30 tablet 1  . predniSONE (DELTASONE) 5 MG tablet Take 1 tablet (5 mg total) by mouth daily.    . QUEtiapine (SEROQUEL) 100 MG tablet Take 100 mg by mouth at bedtime.    . tacrolimus (PROGRAF) 0.5 MG capsule Take 0.5 mg by mouth 2 (two) times daily.     . traZODone (DESYREL) 150 MG tablet Take 150 mg by mouth at bedtime as needed for sleep.     [name] warfarin (COUMADIN) 4 MG tablet TAKE AS DIRECTED BY COUMADIN CLINIC. (Patient taking differently: Take 4 mg by mouth at bedtime. ) 40 tablet 2   No current facility-administered  medications for this visit.     Allergies:   Other; Grapefruit concentrate; Haloperidol; Nsaids; Rifampin; and Triazolam   Social History: Social History   Socioeconomic History  . Marital status: Single    Spouse name: Not on file  . Number of children: 0  . Years of education: Not on file  . Highest education level: Not on file  Occupational History  . Occupation: retired  Scientific laboratory technician  . Financial resource strain: Not on file  . Food insecurity:    Worry: Not on file    Inability: Not on file  . Transportation needs:    Medical: Not on file    Non-medical: Not on file  Tobacco Use  . Smoking status: Never Smoker  . Smokeless tobacco: Never Used  Substance and Sexual Activity  . Alcohol use: No  . Drug use: No  . Sexual activity: Not on file  Lifestyle  . Physical activity:    Days per week: Not on file    Minutes per session: Not on file  . Stress: Not on file  Relationships  . Social connections:    Talks on phone: Not on file    Gets together: Not on file    Attends religious service: Not on file    Active member of club or organization: Not on file    Attends meetings of clubs or organizations: Not on file    Relationship status: Not on file  . Intimate partner violence:    Fear of current or ex partner: Not on file    Emotionally abused: Not on file    Physically abused: Not on file    Forced sexual activity: Not on file  Other Topics Concern  . Not on file  Social History Narrative  . Not on file    Family History: Family History  Problem Relation Age of Onset  . Atrial fibrillation Mother   . Breast cancer Mother   . Renal cancer Father   . Hypertension Brother   . Hyperlipidemia Brother   . Other Brother        stent   . Renal Disease Maternal Grandmother   . Stroke Maternal Grandmother   . Heart attack Maternal Grandfather   . Cancer Paternal Grandmother   . Heart failure Paternal Grandfather   . Emphysema Paternal Grandfather   .  Bronchiolitis Paternal Grandfather     Review of Systems: All other systems reviewed and are otherwise negative except as noted above.   Physical Exam: VS:  BP 110/60  Pulse 66   Ht 5\' 10"  (1.778 m)   Wt 198 lb (89.8 kg)   SpO2 94%   BMI 28.41 kg/m  , BMI Body mass index is 28.41 kg/m. Wt Readings from Last 3 Encounters:  11/13/18 198 lb (89.8 kg)  10/17/18 196 lb 10.4 oz (89.2 kg)  10/02/18 189 lb 12.8 oz (86.1 kg)    GEN- The patient is chronically ill appearing, alert and oriented x 3 today.   HEENT: normocephalic, atraumatic; sclera clear, conjunctiva pink; hearing intact; oropharynx clear; neck supple  Lungs- Clear to ausculation bilaterally, normal work of breathing.  No wheezes, rales, rhonchi Heart- Regular rate and rhythm  GI- soft, non-tender, non-distended, bowel sounds present  Extremities- no clubbing, cyanosis, or edema  MS- no significant deformity or atrophy Skin- warm and dry, no rash or lesion  Psych- euthymic mood, full affect Neuro- strength and sensation are intact   EKG:  EKG is not ordered today.  Recent Labs: 08/05/2018: B Natriuretic Peptide 503.5; Magnesium 1.9 10/12/2018: TSH 2.810 10/19/2018: ALT 13; BUN 21; Creatinine, Ser 1.51; Hemoglobin 10.7; Platelets 67; Potassium 3.1; Sodium 138    Other studies Reviewed: Additional studies/ records that were reviewed today include: Dr Olin Pia office notes   Assessment and Plan: 1.  Ventricular tachycardia RBB L superior axis VT noted during admission 07/2018 EF 35-40% at cath, echo read pending today  Continue amiodarone for now (LFT's, TSH from 10/2018 reviewed) Dr [name] had long discussion with patient at last office visit.  He would very much like to avoid recurrent VT and would like to continue amiodarone for now even if EF recovered.   2.  CAD s/p CABG No recent ischemic symptoms Continue medical thearpy  3.  Permanent atrial fibrillation V rates controlled Continue Warfarin for  CHADS2VASC of 3  4.  HTN Stable No change required today  5.  AKI Repeat BMET today    Current medicines are reviewed at length with the patient today.   The patient does not have concerns regarding his medicines.  The following changes were made today:  none  Labs/ tests ordered today include: BMET Orders Placed This Encounter  Procedures  . Basic metabolic panel     Disposition:   Follow up with Dr [name] in 3 months     Signed, Chanetta Marshall, NP 11/13/2018 1:01 PM   [place] [address] [place] [address] [phone] (office) [phone] (fax)

## 2018-11-13 NOTE — Patient Instructions (Signed)
Medication Instructions: Your physician recommends that you continue on your current medications as directed. Please refer to the Current Medication list given to you today.    If you need a refill on your cardiac medications before your next appointment, please call your pharmacy.   Lab work: BMET TODAY    If you have labs (blood work) drawn today and your tests are completely normal, you will receive your results only by: [name] MyChart Message (if you have MyChart) OR . A paper copy in the mail If you have any lab test that is abnormal or we need to change your treatment, we will call you to review the results.  Testing/Procedures:NONE ORDERED  TODAY    Follow-Up:   At [facility], you and your health needs are our priority.  As part of our continuing mission to provide you with exceptional heart care, we have created designated Provider Care Teams.  These Care Teams include your primary Cardiologist (physician) and Advanced Practice Providers (APPs -  Physician Assistants and Nurse Practitioners) who all work together to provide you with the care you need, when you need it. You will need a follow up appointment in 3 months.  Please call our office 2 months in advance to schedule this appointment.  You may see Dr [name]  or one of the following Advanced Practice Providers on your designated Care Team:   Chanetta Marshall, NP . Tommye Standard, PA-C  Any Other Special Instructions Will Be Listed Below (If Applicable).

## 2018-11-14 LAB — BASIC METABOLIC PANEL
BUN/Creatinine Ratio: 16 (ref 10–24)
BUN: 30 mg/dL — ABNORMAL HIGH (ref 8–27)
CO2: 29 mmol/L (ref 20–29)
Calcium: 9.9 mg/dL (ref 8.6–10.2)
Chloride: 96 mmol/L (ref 96–106)
Creatinine, Ser: 1.86 mg/dL — ABNORMAL HIGH (ref 0.76–1.27)
GFR calc Af Amer: 43 mL/min/{1.73_m2} — ABNORMAL LOW (ref >59)
GFR calc non Af Amer: 37 mL/min/{1.73_m2} — ABNORMAL LOW (ref >59)
GLUCOSE: 94 mg/dL (ref 65–99)
Potassium: 3.8 mmol/L (ref 3.5–5.2)
Sodium: 141 mmol/L (ref 134–144)

## 2018-11-15 ENCOUNTER — Telehealth: Payer: Self-pay

## 2018-11-15 NOTE — Telephone Encounter (Signed)
Notes recorded by Frederik Schmidt, RN on 11/15/2018 at 8:07 AM EST [id number] 1/8 ------

## 2018-11-15 NOTE — Telephone Encounter (Signed)
Notes recorded by Frederik Schmidt, RN on 11/15/2018 at 4:23 PM EST The patient has been notified of the result and verbalized understanding. All questions (if any) were answered. Frederik Schmidt, RN 11/15/2018 4:23 PM

## 2018-11-15 NOTE — Telephone Encounter (Signed)
-----   Message from Martin Berthold, NP sent at 11/14/2018  8:10 AM EST ----- Please notify patient of lab results. Creatinine improving from recent PCP labs, K+ also improving. Please send copy to PCP and have patient follow up with her to see when she would like to repeat blood work. Thanks!

## 2018-11-15 NOTE — Telephone Encounter (Signed)
-----   Message from Patsey Berthold, NP sent at 11/14/2018  8:10 AM EST ----- Please notify patient of lab results. Creatinine improving from recent PCP labs, K+ also improving. Please send copy to PCP and have patient follow up with her to see when she would like to repeat blood work. Thanks!

## 2018-11-16 LAB — POCT INR: INR: 2.6 (ref 2.0–3.0)

## 2018-11-17 ENCOUNTER — Ambulatory Visit (INDEPENDENT_AMBULATORY_CARE_PROVIDER_SITE_OTHER): Payer: Medicare Other | Admitting: Internal Medicine

## 2018-11-17 DIAGNOSIS — Z5181 Encounter for therapeutic drug level monitoring: Secondary | ICD-10-CM

## 2018-11-17 DIAGNOSIS — Z7901 Long term (current) use of anticoagulants: Secondary | ICD-10-CM

## 2018-11-17 NOTE — Patient Instructions (Signed)
Description   Amiodarone started 08/09/18 @discharge . Spoke with pt and instructed pt to continue taking 2mg  daily except 4mg  on Mondays, Wednesdays, and Fridays.   Recheck INR in 1 week for insurance purposes. 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

## 2018-11-23 LAB — POCT INR: INR: 3 (ref 2.0–3.0)

## 2018-11-24 ENCOUNTER — Ambulatory Visit (INDEPENDENT_AMBULATORY_CARE_PROVIDER_SITE_OTHER): Payer: Medicare Other | Admitting: Internal Medicine

## 2018-11-24 DIAGNOSIS — I4891 Unspecified atrial fibrillation: Secondary | ICD-10-CM

## 2018-11-24 DIAGNOSIS — Z7901 Long term (current) use of anticoagulants: Secondary | ICD-10-CM | POA: Diagnosis not present

## 2018-11-24 DIAGNOSIS — Z5181 Encounter for therapeutic drug level monitoring: Secondary | ICD-10-CM

## 2018-12-01 ENCOUNTER — Ambulatory Visit (INDEPENDENT_AMBULATORY_CARE_PROVIDER_SITE_OTHER): Payer: Medicare Other | Admitting: Cardiology

## 2018-12-01 DIAGNOSIS — Z5181 Encounter for therapeutic drug level monitoring: Secondary | ICD-10-CM | POA: Diagnosis not present

## 2018-12-01 DIAGNOSIS — Z7901 Long term (current) use of anticoagulants: Secondary | ICD-10-CM

## 2018-12-01 DIAGNOSIS — I4891 Unspecified atrial fibrillation: Secondary | ICD-10-CM | POA: Diagnosis not present

## 2018-12-01 LAB — POCT INR: INR: 2.5 (ref 2.0–3.0)

## 2018-12-01 NOTE — Patient Instructions (Signed)
Description   Spoke with pt and instructed pt to continue taking 2mg  daily except 4mg  on Mondays, Wednesdays, and Fridays.   Recheck INR in 1 week for insurance purposes. (pt is a Personal assistant) # [phone]  Amiodarone started 08/09/18 @discharge .

## 2018-12-07 LAB — POCT INR: INR: 3.1 — AB (ref 2.0–3.0)

## 2018-12-08 ENCOUNTER — Ambulatory Visit (INDEPENDENT_AMBULATORY_CARE_PROVIDER_SITE_OTHER): Payer: Medicare Other

## 2018-12-08 DIAGNOSIS — Z7901 Long term (current) use of anticoagulants: Secondary | ICD-10-CM | POA: Diagnosis not present

## 2018-12-08 DIAGNOSIS — Z5181 Encounter for therapeutic drug level monitoring: Secondary | ICD-10-CM | POA: Diagnosis not present

## 2018-12-08 NOTE — Patient Instructions (Signed)
Description   Spoke with pt and instructed pt to take 2mg  today, then resume same dosage 2mg  daily except 4mg  on Mondays, Wednesdays, and Fridays.   Recheck INR in 1 week for insurance purposes. (pt is a Personal assistant) # [phone]  Amiodarone started 08/09/18 @discharge .

## 2018-12-14 LAB — POCT INR: INR: 2.4 (ref 2.0–3.0)

## 2018-12-15 ENCOUNTER — Ambulatory Visit (INDEPENDENT_AMBULATORY_CARE_PROVIDER_SITE_OTHER): Payer: Medicare Other | Admitting: Cardiology

## 2018-12-15 DIAGNOSIS — Z5181 Encounter for therapeutic drug level monitoring: Secondary | ICD-10-CM | POA: Diagnosis not present

## 2018-12-15 DIAGNOSIS — I4891 Unspecified atrial fibrillation: Secondary | ICD-10-CM | POA: Diagnosis not present

## 2018-12-15 DIAGNOSIS — Z7901 Long term (current) use of anticoagulants: Secondary | ICD-10-CM

## 2018-12-15 NOTE — Patient Instructions (Signed)
Description   Spoke with pt and instructed pt to continue taking 2mg  daily except 4mg  on Mondays, Wednesdays, and Fridays.   Recheck INR in 1 week for insurance purposes. (pt is a Personal assistant) # [phone]  Amiodarone started 08/09/18 @discharge .

## 2018-12-25 ENCOUNTER — Ambulatory Visit (INDEPENDENT_AMBULATORY_CARE_PROVIDER_SITE_OTHER): Payer: Medicare Other

## 2018-12-25 ENCOUNTER — Other Ambulatory Visit: Payer: Self-pay | Admitting: Internal Medicine

## 2018-12-25 DIAGNOSIS — Z5181 Encounter for therapeutic drug level monitoring: Secondary | ICD-10-CM | POA: Diagnosis not present

## 2018-12-25 DIAGNOSIS — Z7901 Long term (current) use of anticoagulants: Secondary | ICD-10-CM | POA: Diagnosis not present

## 2018-12-25 LAB — POCT INR: INR: 2.5 (ref 2.0–3.0)

## 2018-12-25 NOTE — Patient Instructions (Signed)
Description   Spoke with pt and instructed pt to continue taking 2mg  daily except 4mg  on Mondays, Wednesdays, and Fridays.   Recheck INR in 1 week for insurance purposes. (pt is a Personal assistant) # [phone]  Amiodarone started 08/09/18 @discharge .

## 2019-01-01 ENCOUNTER — Ambulatory Visit (INDEPENDENT_AMBULATORY_CARE_PROVIDER_SITE_OTHER): Payer: Medicare Other | Admitting: Pharmacist

## 2019-01-01 DIAGNOSIS — Z7901 Long term (current) use of anticoagulants: Secondary | ICD-10-CM | POA: Diagnosis not present

## 2019-01-01 DIAGNOSIS — Z5181 Encounter for therapeutic drug level monitoring: Secondary | ICD-10-CM | POA: Diagnosis not present

## 2019-01-01 LAB — POCT INR: INR: 3 (ref 2.0–3.0)

## 2019-01-09 ENCOUNTER — Ambulatory Visit (INDEPENDENT_AMBULATORY_CARE_PROVIDER_SITE_OTHER): Payer: Medicare Other | Admitting: Cardiology

## 2019-01-09 DIAGNOSIS — Z7901 Long term (current) use of anticoagulants: Secondary | ICD-10-CM

## 2019-01-09 DIAGNOSIS — Z5181 Encounter for therapeutic drug level monitoring: Secondary | ICD-10-CM | POA: Diagnosis not present

## 2019-01-09 LAB — POCT INR: INR: 3.6 — AB (ref 2.0–3.0)

## 2019-01-15 ENCOUNTER — Ambulatory Visit (INDEPENDENT_AMBULATORY_CARE_PROVIDER_SITE_OTHER): Payer: Medicare Other | Admitting: Internal Medicine

## 2019-01-15 DIAGNOSIS — Z7901 Long term (current) use of anticoagulants: Secondary | ICD-10-CM

## 2019-01-15 DIAGNOSIS — Z5181 Encounter for therapeutic drug level monitoring: Secondary | ICD-10-CM | POA: Diagnosis not present

## 2019-01-15 LAB — POCT INR: INR: 2.5 (ref 2.0–3.0)

## 2019-01-15 NOTE — Patient Instructions (Signed)
Description   Spoke with pt and instructed pt to continue taking 2mg  daily except 4mg  on Mondays, Wednesdays, and Fridays. Recheck INR in 1 week for insurance purposes. (pt is a Personal assistant) # [phone]  Amiodarone started 08/09/18 @discharge .

## 2019-01-19 ENCOUNTER — Other Ambulatory Visit: Payer: Self-pay | Admitting: Internal Medicine

## 2019-01-23 ENCOUNTER — Ambulatory Visit (INDEPENDENT_AMBULATORY_CARE_PROVIDER_SITE_OTHER): Payer: Medicare Other | Admitting: Interventional Cardiology

## 2019-01-23 DIAGNOSIS — Z7901 Long term (current) use of anticoagulants: Secondary | ICD-10-CM

## 2019-01-23 DIAGNOSIS — Z5181 Encounter for therapeutic drug level monitoring: Secondary | ICD-10-CM | POA: Diagnosis not present

## 2019-01-23 LAB — POCT INR: INR: 3.6 — AB (ref 2.0–3.0)

## 2019-01-23 NOTE — Patient Instructions (Signed)
Description   Spoke with pt and instructed pt to hold today's dose then continue taking 2mg  daily except 4mg  on Mondays, Wednesdays, and Fridays. Recheck INR in 1 week for insurance purposes. (pt is a Personal assistant) # [phone]  Amiodarone started 08/09/18 @discharge .

## 2019-01-29 LAB — POCT INR: INR: 2.3 (ref 2.0–3.0)

## 2019-01-30 ENCOUNTER — Ambulatory Visit (INDEPENDENT_AMBULATORY_CARE_PROVIDER_SITE_OTHER): Payer: Medicare Other | Admitting: Cardiology

## 2019-01-30 DIAGNOSIS — Z5181 Encounter for therapeutic drug level monitoring: Secondary | ICD-10-CM | POA: Diagnosis not present

## 2019-01-30 DIAGNOSIS — Z7901 Long term (current) use of anticoagulants: Secondary | ICD-10-CM

## 2019-02-05 LAB — POCT INR: INR: 2.2 (ref 2.0–3.0)

## 2019-02-06 ENCOUNTER — Ambulatory Visit (INDEPENDENT_AMBULATORY_CARE_PROVIDER_SITE_OTHER): Payer: Medicare Other | Admitting: Pharmacist

## 2019-02-06 DIAGNOSIS — Z5181 Encounter for therapeutic drug level monitoring: Secondary | ICD-10-CM

## 2019-02-06 DIAGNOSIS — Z7901 Long term (current) use of anticoagulants: Secondary | ICD-10-CM | POA: Diagnosis not present

## 2019-02-06 NOTE — Patient Instructions (Signed)
Description   Spoke with pt and instructed pt continue taking 2mg  daily except 4mg  on Mondays, Wednesdays, and Fridays. Recheck INR in 1 week for insurance purposes. (pt is a Personal assistant) # [phone]  Amiodarone started 08/09/18 @discharge .

## 2019-02-07 ENCOUNTER — Telehealth: Payer: Self-pay | Admitting: Internal Medicine

## 2019-02-07 NOTE — Telephone Encounter (Signed)
Follow Up: ° ° ° °Returning your call from yesterday. °

## 2019-02-07 NOTE — Telephone Encounter (Signed)
Returned call to the pt and he remembers peaking to me yesterday but had a message to call and he wasn't sure why. We called his once and left a msg and when we called again he answered and he thought something else was needed. Pt has dose instructions and will recheck on 02/12/2019 as discussed yesterday.

## 2019-02-14 ENCOUNTER — Ambulatory Visit (INDEPENDENT_AMBULATORY_CARE_PROVIDER_SITE_OTHER): Payer: Medicare Other | Admitting: Internal Medicine

## 2019-02-14 ENCOUNTER — Telehealth: Payer: Self-pay

## 2019-02-14 DIAGNOSIS — Z5181 Encounter for therapeutic drug level monitoring: Secondary | ICD-10-CM | POA: Diagnosis not present

## 2019-02-14 DIAGNOSIS — Z7901 Long term (current) use of anticoagulants: Secondary | ICD-10-CM

## 2019-02-14 DIAGNOSIS — I4891 Unspecified atrial fibrillation: Secondary | ICD-10-CM | POA: Diagnosis not present

## 2019-02-14 LAB — POCT INR: INR: 2.8 (ref 2.0–3.0)

## 2019-02-14 NOTE — Telephone Encounter (Signed)
I called and spoke with patient, he does not want to do a video visit. He would rather come into the office. He is scheduled for a 6 month follow up on 02/23/19; he states that he is doing fine and is not having any issues. Patient is fine with pushing out his follow up.

## 2019-02-15 NOTE — Telephone Encounter (Signed)
LVM for return call. 

## 2019-02-19 ENCOUNTER — Ambulatory Visit (INDEPENDENT_AMBULATORY_CARE_PROVIDER_SITE_OTHER): Payer: Medicare Other | Admitting: Pharmacist

## 2019-02-19 DIAGNOSIS — Z7901 Long term (current) use of anticoagulants: Secondary | ICD-10-CM | POA: Diagnosis not present

## 2019-02-19 DIAGNOSIS — Z5181 Encounter for therapeutic drug level monitoring: Secondary | ICD-10-CM | POA: Diagnosis not present

## 2019-02-19 LAB — POCT INR: INR: 3.3 — AB (ref 2.0–3.0)

## 2019-02-19 NOTE — Telephone Encounter (Signed)
Attempted to call pt to assess his needs, if any, since he elected to postpone his follow up appt with Dr. [name]. Unfortunately, his telephone number has been disconnected.

## 2019-02-23 ENCOUNTER — Ambulatory Visit: Payer: Medicare Other | Admitting: Internal Medicine

## 2019-02-26 LAB — POCT INR: INR: 3.4 — AB (ref 2.0–3.0)

## 2019-02-27 ENCOUNTER — Ambulatory Visit (INDEPENDENT_AMBULATORY_CARE_PROVIDER_SITE_OTHER): Payer: Medicare Other | Admitting: Interventional Cardiology

## 2019-02-27 DIAGNOSIS — Z5181 Encounter for therapeutic drug level monitoring: Secondary | ICD-10-CM

## 2019-02-27 DIAGNOSIS — I4891 Unspecified atrial fibrillation: Secondary | ICD-10-CM

## 2019-02-27 DIAGNOSIS — Z7901 Long term (current) use of anticoagulants: Secondary | ICD-10-CM

## 2019-03-05 ENCOUNTER — Ambulatory Visit (INDEPENDENT_AMBULATORY_CARE_PROVIDER_SITE_OTHER): Payer: Medicare Other | Admitting: Pharmacist

## 2019-03-05 DIAGNOSIS — Z7901 Long term (current) use of anticoagulants: Secondary | ICD-10-CM

## 2019-03-05 DIAGNOSIS — Z5181 Encounter for therapeutic drug level monitoring: Secondary | ICD-10-CM | POA: Diagnosis not present

## 2019-03-05 LAB — POCT INR: INR: 2.6 (ref 2.0–3.0)

## 2019-03-12 LAB — POCT INR: INR: 1.5 — AB (ref 2.0–3.0)

## 2019-03-13 ENCOUNTER — Ambulatory Visit (INDEPENDENT_AMBULATORY_CARE_PROVIDER_SITE_OTHER): Payer: Medicare Other | Admitting: Pharmacist Clinician (PhC)/ Clinical Pharmacy Specialist

## 2019-03-13 DIAGNOSIS — I4821 Permanent atrial fibrillation: Secondary | ICD-10-CM

## 2019-03-13 DIAGNOSIS — Z7901 Long term (current) use of anticoagulants: Secondary | ICD-10-CM

## 2019-03-13 DIAGNOSIS — Z5181 Encounter for therapeutic drug level monitoring: Secondary | ICD-10-CM

## 2019-03-15 NOTE — Patient Instructions (Signed)
Description   Spoke with pt and instructed pt to take 4mg  today and tomorrow, then start taking 2mg  daily except 4mg  on Mondays, Wednesdays and Fridays. Recheck INR in 1 week for insurance purposes. 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

## 2019-03-19 ENCOUNTER — Ambulatory Visit (INDEPENDENT_AMBULATORY_CARE_PROVIDER_SITE_OTHER): Payer: Medicare Other | Admitting: Pharmacist

## 2019-03-19 DIAGNOSIS — Z7901 Long term (current) use of anticoagulants: Secondary | ICD-10-CM

## 2019-03-19 DIAGNOSIS — Z5181 Encounter for therapeutic drug level monitoring: Secondary | ICD-10-CM

## 2019-03-19 LAB — POCT INR: INR: 2.1 (ref 2.0–3.0)

## 2019-03-19 NOTE — Patient Instructions (Signed)
Description   continue taking 2mg  daily except 4mg  on Mondays, Wednesdays and Fridays. Recheck INR in 1 week for insurance purposes. (pt is a Personal assistant) # [phone].

## 2019-03-21 DIAGNOSIS — D693 Immune thrombocytopenic purpura: Secondary | ICD-10-CM | POA: Diagnosis not present

## 2019-03-27 ENCOUNTER — Ambulatory Visit (INDEPENDENT_AMBULATORY_CARE_PROVIDER_SITE_OTHER): Payer: Medicare Other | Admitting: Cardiology

## 2019-03-27 DIAGNOSIS — Z7901 Long term (current) use of anticoagulants: Secondary | ICD-10-CM

## 2019-03-27 DIAGNOSIS — Z5181 Encounter for therapeutic drug level monitoring: Secondary | ICD-10-CM | POA: Diagnosis not present

## 2019-03-27 LAB — POCT INR: INR: 2.3 (ref 2.0–3.0)

## 2019-04-04 ENCOUNTER — Ambulatory Visit (INDEPENDENT_AMBULATORY_CARE_PROVIDER_SITE_OTHER): Payer: Medicare Other | Admitting: Pharmacist

## 2019-04-04 DIAGNOSIS — Z7901 Long term (current) use of anticoagulants: Secondary | ICD-10-CM | POA: Diagnosis not present

## 2019-04-04 DIAGNOSIS — Z5181 Encounter for therapeutic drug level monitoring: Secondary | ICD-10-CM

## 2019-04-04 LAB — POCT INR: INR: 2.7 (ref 2.0–3.0)

## 2019-04-06 LAB — POCT INR: INR: 2.3 (ref 2.0–3.0)

## 2019-04-09 ENCOUNTER — Ambulatory Visit (INDEPENDENT_AMBULATORY_CARE_PROVIDER_SITE_OTHER): Payer: Medicare Other | Admitting: Pharmacist

## 2019-04-09 DIAGNOSIS — Z5181 Encounter for therapeutic drug level monitoring: Secondary | ICD-10-CM

## 2019-04-09 DIAGNOSIS — Z7901 Long term (current) use of anticoagulants: Secondary | ICD-10-CM | POA: Diagnosis not present

## 2019-04-16 ENCOUNTER — Telehealth: Payer: Self-pay | Admitting: *Deleted

## 2019-04-16 LAB — POCT INR: INR: 1.8 — AB (ref 2.0–3.0)

## 2019-04-16 NOTE — Telephone Encounter (Signed)
Placed a call to the pt in reference to his INR ( self tester) that was due on 04/13/2019. Left a message for the pt to call back.

## 2019-04-17 ENCOUNTER — Ambulatory Visit (INDEPENDENT_AMBULATORY_CARE_PROVIDER_SITE_OTHER): Payer: Medicare Other | Admitting: Pharmacist

## 2019-04-17 DIAGNOSIS — I4821 Permanent atrial fibrillation: Secondary | ICD-10-CM

## 2019-04-17 DIAGNOSIS — Z5181 Encounter for therapeutic drug level monitoring: Secondary | ICD-10-CM

## 2019-04-17 DIAGNOSIS — Z7901 Long term (current) use of anticoagulants: Secondary | ICD-10-CM | POA: Diagnosis not present

## 2019-04-17 NOTE — Telephone Encounter (Signed)
Patient checked his INR last night. Fax sent over. Called patient to review results. No answer. Left message to return call.

## 2019-04-24 LAB — POCT INR: INR: 2.8 (ref 2.0–3.0)

## 2019-04-25 ENCOUNTER — Ambulatory Visit (INDEPENDENT_AMBULATORY_CARE_PROVIDER_SITE_OTHER): Payer: Medicare Other | Admitting: Cardiovascular Disease

## 2019-04-25 DIAGNOSIS — Z5181 Encounter for therapeutic drug level monitoring: Secondary | ICD-10-CM

## 2019-04-25 DIAGNOSIS — Z7901 Long term (current) use of anticoagulants: Secondary | ICD-10-CM | POA: Diagnosis not present

## 2019-04-25 NOTE — Patient Instructions (Signed)
Description   Continue taking 2mg  daily except 4mg  on Mondays, Wednesdays and Fridays. Recheck INR in 1 week for insurance purposes (Personal assistant).

## 2019-05-01 ENCOUNTER — Ambulatory Visit (INDEPENDENT_AMBULATORY_CARE_PROVIDER_SITE_OTHER): Payer: Medicare Other | Admitting: Internal Medicine

## 2019-05-01 DIAGNOSIS — Z7901 Long term (current) use of anticoagulants: Secondary | ICD-10-CM

## 2019-05-01 DIAGNOSIS — Z5181 Encounter for therapeutic drug level monitoring: Secondary | ICD-10-CM

## 2019-05-01 LAB — POCT INR: INR: 1.9 — AB (ref 2.0–3.0)

## 2019-05-01 NOTE — Patient Instructions (Addendum)
Description   Called spoke with pt, advised to start taking 4mg  daily except 2mg  on Sundays, Tuesdays and Thursdays. Recheck INR in 1 week for insurance purposes (Personal assistant).

## 2019-05-10 ENCOUNTER — Ambulatory Visit (INDEPENDENT_AMBULATORY_CARE_PROVIDER_SITE_OTHER): Payer: Medicare Other | Admitting: Cardiovascular Disease

## 2019-05-10 DIAGNOSIS — Z5181 Encounter for therapeutic drug level monitoring: Secondary | ICD-10-CM | POA: Diagnosis not present

## 2019-05-10 DIAGNOSIS — Z7901 Long term (current) use of anticoagulants: Secondary | ICD-10-CM

## 2019-05-10 LAB — POCT INR: INR: 2.1 (ref 2.0–3.0)

## 2019-05-10 NOTE — Patient Instructions (Signed)
Description   Called spoke with pt, advised to continue taking 4mg  daily except 2mg  on Sundays, Tuesdays and Thursdays. Recheck INR in 1 week for insurance purposes (Personal assistant).

## 2019-05-14 LAB — POCT INR: INR: 2.7 (ref 2.0–3.0)

## 2019-05-15 ENCOUNTER — Ambulatory Visit (INDEPENDENT_AMBULATORY_CARE_PROVIDER_SITE_OTHER): Payer: Medicare Other | Admitting: Cardiology

## 2019-05-15 DIAGNOSIS — Z5181 Encounter for therapeutic drug level monitoring: Secondary | ICD-10-CM

## 2019-05-15 DIAGNOSIS — Z7901 Long term (current) use of anticoagulants: Secondary | ICD-10-CM

## 2019-05-15 NOTE — Patient Instructions (Signed)
Description   Called spoke with pt, advised to continue taking 4mg  daily except 2mg  on Sundays, Tuesdays and Thursdays. Recheck INR in 1 week for insurance purposes (Personal assistant).

## 2019-05-22 LAB — POCT INR: INR: 3.5 — AB (ref 2.0–3.0)

## 2019-05-23 ENCOUNTER — Ambulatory Visit (INDEPENDENT_AMBULATORY_CARE_PROVIDER_SITE_OTHER): Payer: Medicare Other | Admitting: Cardiovascular Disease

## 2019-05-23 DIAGNOSIS — I4891 Unspecified atrial fibrillation: Secondary | ICD-10-CM

## 2019-05-23 DIAGNOSIS — Z5181 Encounter for therapeutic drug level monitoring: Secondary | ICD-10-CM

## 2019-05-23 DIAGNOSIS — Z7901 Long term (current) use of anticoagulants: Secondary | ICD-10-CM

## 2019-05-24 NOTE — Patient Instructions (Signed)
Description   Called spoke with pt, advised pt to hold today's dose (since pt did not call back yesterday) then continue taking 4mg  daily except 2mg  on Sundays, Tuesdays and Thursdays. Recheck INR in 1 week for insurance purposes (Personal assistant).

## 2019-05-28 LAB — POCT INR: INR: 2.9 (ref 2.0–3.0)

## 2019-05-30 ENCOUNTER — Ambulatory Visit (INDEPENDENT_AMBULATORY_CARE_PROVIDER_SITE_OTHER): Payer: Medicare Other | Admitting: Interventional Cardiology

## 2019-05-30 DIAGNOSIS — Z7901 Long term (current) use of anticoagulants: Secondary | ICD-10-CM

## 2019-05-30 DIAGNOSIS — Z5181 Encounter for therapeutic drug level monitoring: Secondary | ICD-10-CM

## 2019-05-30 NOTE — Patient Instructions (Signed)
Description   Called spoke with pt, advised pt to continue taking 4mg  daily except 2mg  on Sundays, Tuesdays, and Thursdays. 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

## 2019-06-04 LAB — POCT INR: INR: 2.8 (ref 2.0–3.0)

## 2019-06-05 ENCOUNTER — Ambulatory Visit (INDEPENDENT_AMBULATORY_CARE_PROVIDER_SITE_OTHER): Payer: Medicare Other | Admitting: Cardiology

## 2019-06-05 DIAGNOSIS — Z5181 Encounter for therapeutic drug level monitoring: Secondary | ICD-10-CM

## 2019-06-05 DIAGNOSIS — Z7901 Long term (current) use of anticoagulants: Secondary | ICD-10-CM

## 2019-06-05 NOTE — Patient Instructions (Signed)
Description   Called spoke with pt, advised pt to continue taking 4mg  daily except 2mg  on Sundays, Tuesdays, and Thursdays. 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

## 2019-06-12 ENCOUNTER — Ambulatory Visit (INDEPENDENT_AMBULATORY_CARE_PROVIDER_SITE_OTHER): Payer: Medicare Other

## 2019-06-12 DIAGNOSIS — Z7901 Long term (current) use of anticoagulants: Secondary | ICD-10-CM | POA: Diagnosis not present

## 2019-06-12 DIAGNOSIS — Z5181 Encounter for therapeutic drug level monitoring: Secondary | ICD-10-CM

## 2019-06-12 LAB — POCT INR: INR: 3.2 — AB (ref 2.0–3.0)

## 2019-06-12 NOTE — Patient Instructions (Signed)
Description   Called spoke with pt, advised pt to start taking 2mg  daily except 4mg  on Mondays, Wednesdays and Fridays.  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

## 2019-06-15 ENCOUNTER — Telehealth: Payer: Self-pay | Admitting: Internal Medicine

## 2019-06-15 NOTE — Telephone Encounter (Signed)
New Message    Pt is on Warfarin and they are wanting to get authorization to switch manufacturers.    Please call back

## 2019-06-15 NOTE — Telephone Encounter (Signed)
Returned call to the Pharmacy and gave authorization to switch from one manufacturer to the other as pt's current pharmacy[facility] is being bought out by [facility] and they use a Risk analyst. Pt's INR is monitored weekly so we will continue to monitor closely.

## 2019-06-18 LAB — POCT INR: INR: 2.6 (ref 2.0–3.0)

## 2019-06-19 ENCOUNTER — Ambulatory Visit (INDEPENDENT_AMBULATORY_CARE_PROVIDER_SITE_OTHER): Payer: Medicare Other | Admitting: Cardiology

## 2019-06-19 DIAGNOSIS — I4821 Permanent atrial fibrillation: Secondary | ICD-10-CM | POA: Diagnosis not present

## 2019-06-19 DIAGNOSIS — Z5181 Encounter for therapeutic drug level monitoring: Secondary | ICD-10-CM

## 2019-06-19 NOTE — Patient Instructions (Addendum)
Description   Called spoke with pt, advised pt to continue taking 2mg  daily except 4mg  on Mondays, Wednesdays and Fridays.  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

## 2019-06-25 ENCOUNTER — Ambulatory Visit (INDEPENDENT_AMBULATORY_CARE_PROVIDER_SITE_OTHER): Payer: Medicare Other | Admitting: Cardiovascular Disease

## 2019-06-25 DIAGNOSIS — Z7901 Long term (current) use of anticoagulants: Secondary | ICD-10-CM | POA: Diagnosis not present

## 2019-06-25 DIAGNOSIS — Z5181 Encounter for therapeutic drug level monitoring: Secondary | ICD-10-CM

## 2019-06-25 LAB — POCT INR: INR: 2.8 (ref 2.0–3.0)

## 2019-06-25 NOTE — Patient Instructions (Signed)
Description   Called spoke with pt, advised pt to continue taking 2mg daily except 4mg on Mondays, Wednesdays and Fridays.  Recheck INR in 1 week for insurance purposes ( Mondays-self tester).    

## 2019-07-03 ENCOUNTER — Ambulatory Visit (INDEPENDENT_AMBULATORY_CARE_PROVIDER_SITE_OTHER): Payer: Medicare Other | Admitting: Cardiovascular Disease

## 2019-07-03 DIAGNOSIS — Z5181 Encounter for therapeutic drug level monitoring: Secondary | ICD-10-CM | POA: Diagnosis not present

## 2019-07-03 DIAGNOSIS — Z7901 Long term (current) use of anticoagulants: Secondary | ICD-10-CM

## 2019-07-03 LAB — POCT INR: INR: 2.9 (ref 2.0–3.0)

## 2019-07-09 ENCOUNTER — Ambulatory Visit (INDEPENDENT_AMBULATORY_CARE_PROVIDER_SITE_OTHER): Payer: Medicare Other | Admitting: Pharmacist

## 2019-07-09 DIAGNOSIS — Z5181 Encounter for therapeutic drug level monitoring: Secondary | ICD-10-CM

## 2019-07-09 DIAGNOSIS — Z7901 Long term (current) use of anticoagulants: Secondary | ICD-10-CM | POA: Diagnosis not present

## 2019-07-09 LAB — POCT INR: INR: 2.4 (ref 2.0–3.0)

## 2019-07-17 ENCOUNTER — Ambulatory Visit (INDEPENDENT_AMBULATORY_CARE_PROVIDER_SITE_OTHER): Payer: Medicare Other | Admitting: Cardiology

## 2019-07-17 DIAGNOSIS — Z7901 Long term (current) use of anticoagulants: Secondary | ICD-10-CM | POA: Diagnosis not present

## 2019-07-17 DIAGNOSIS — Z5181 Encounter for therapeutic drug level monitoring: Secondary | ICD-10-CM

## 2019-07-17 LAB — POCT INR: INR: 2.3 (ref 2.0–3.0)

## 2019-07-17 NOTE — Patient Instructions (Signed)
Description   Called spoke with pt, advised pt to continue taking 2mg daily except 4mg on Mondays, Wednesdays and Fridays.  Recheck INR in 1 week for insurance purposes ( Mondays-self tester).    

## 2019-07-23 LAB — POCT INR: INR: 2.5 (ref 2.0–3.0)

## 2019-07-24 ENCOUNTER — Ambulatory Visit (INDEPENDENT_AMBULATORY_CARE_PROVIDER_SITE_OTHER): Payer: Medicare Other | Admitting: Cardiology

## 2019-07-24 DIAGNOSIS — Z5181 Encounter for therapeutic drug level monitoring: Secondary | ICD-10-CM

## 2019-07-24 DIAGNOSIS — J9601 Acute respiratory failure with hypoxia: Secondary | ICD-10-CM

## 2019-07-24 DIAGNOSIS — I4821 Permanent atrial fibrillation: Secondary | ICD-10-CM

## 2019-07-24 NOTE — Patient Instructions (Signed)
Description   Called spoke with pt, advised pt to continue taking 2mg daily except 4mg on Mondays, Wednesdays and Fridays.  Recheck INR in 1 week for insurance purposes ( Mondays-self tester).    

## 2019-08-02 ENCOUNTER — Ambulatory Visit (INDEPENDENT_AMBULATORY_CARE_PROVIDER_SITE_OTHER): Payer: Medicare Other

## 2019-08-02 DIAGNOSIS — Z5181 Encounter for therapeutic drug level monitoring: Secondary | ICD-10-CM

## 2019-08-02 DIAGNOSIS — Z7901 Long term (current) use of anticoagulants: Secondary | ICD-10-CM | POA: Diagnosis not present

## 2019-08-02 LAB — POCT INR: INR: 2.9 (ref 2.0–3.0)

## 2019-08-02 NOTE — Patient Instructions (Signed)
Description   Called spoke with pt, advised pt to continue taking 2mg  daily except 4mg  on Mondays, Wednesdays and Fridays.  Recheck INR in 1 week for insurance purposes.

## 2019-08-06 LAB — POCT INR: INR: 2.6 (ref 2.0–3.0)

## 2019-08-07 ENCOUNTER — Ambulatory Visit (INDEPENDENT_AMBULATORY_CARE_PROVIDER_SITE_OTHER): Payer: Medicare Other | Admitting: *Deleted

## 2019-08-07 DIAGNOSIS — Z5181 Encounter for therapeutic drug level monitoring: Secondary | ICD-10-CM | POA: Diagnosis not present

## 2019-08-07 DIAGNOSIS — Z7901 Long term (current) use of anticoagulants: Secondary | ICD-10-CM

## 2019-08-13 LAB — POCT INR: INR: 3.2 — AB (ref 2.0–3.0)

## 2019-08-14 ENCOUNTER — Ambulatory Visit (INDEPENDENT_AMBULATORY_CARE_PROVIDER_SITE_OTHER): Payer: Medicare Other | Admitting: Cardiology

## 2019-08-14 DIAGNOSIS — Z5181 Encounter for therapeutic drug level monitoring: Secondary | ICD-10-CM

## 2019-08-14 DIAGNOSIS — Z7901 Long term (current) use of anticoagulants: Secondary | ICD-10-CM

## 2019-08-21 LAB — POCT INR: INR: 2.6 (ref 2.0–3.0)

## 2019-08-22 ENCOUNTER — Ambulatory Visit (INDEPENDENT_AMBULATORY_CARE_PROVIDER_SITE_OTHER): Payer: Medicare Other | Admitting: Internal Medicine

## 2019-08-22 ENCOUNTER — Ambulatory Visit (INDEPENDENT_AMBULATORY_CARE_PROVIDER_SITE_OTHER): Payer: Medicare Other | Admitting: Interventional Cardiology

## 2019-08-22 ENCOUNTER — Encounter: Payer: Self-pay | Admitting: Internal Medicine

## 2019-08-22 ENCOUNTER — Other Ambulatory Visit: Payer: Self-pay

## 2019-08-22 VITALS — BP 132/60 | HR 66 | Ht 70.0 in | Wt 200.6 lb

## 2019-08-22 DIAGNOSIS — I472 Ventricular tachycardia, unspecified: Secondary | ICD-10-CM

## 2019-08-22 DIAGNOSIS — I10 Essential (primary) hypertension: Secondary | ICD-10-CM

## 2019-08-22 DIAGNOSIS — I25810 Atherosclerosis of coronary artery bypass graft(s) without angina pectoris: Secondary | ICD-10-CM

## 2019-08-22 DIAGNOSIS — I4821 Permanent atrial fibrillation: Secondary | ICD-10-CM | POA: Diagnosis not present

## 2019-08-22 DIAGNOSIS — Z5181 Encounter for therapeutic drug level monitoring: Secondary | ICD-10-CM

## 2019-08-22 NOTE — Progress Notes (Signed)
Patient Care Team: [name]., MD as PCP - General (Family Medicine) Deboraha Sprang, MD as Consulting Physician (Cardiology) Jamal Maes, MD as Consulting Physician (Nephrology)   HPI  [name] is a 68 y.o. male seen in followup for permanent atrial fibrillation in the context of CAD with prior CABG and EF 41%;  he has a history of kidney and liver transplant 2008 and is followed at [facility] and Results Reviewed -HOLTER  6/16  HR excursion quite broad and carvidolol doubled DATE TEST EF   1/16 Myoview  41   anterolateral and inferior scars   9/16 echo  45-50 Reduced RVSF/Mod LAE   2/18 Echo  40-45   11/18 Echo  55-65%   9/19 Echo  35-40%   10/19 LHC  LIMA  SVG R SVG PD patent SVG OM occluded  1/20 Echo  35-40% LVH 13-44mm    Was hospitalized 9/19 with ventricular tachycardia.  He was seen in consult by Dr. Greggory Brandy.  He has previously had been 55-60% and beta-blockers were initiated.  With repeat echo demonstrating EF 40% or so it was elected to put him on amiodarone.     Date Cr K Hgb TSH LFTs  1/20 1.75 3.7        8/20 1.52 3.8    14      The patient is a DNR.  He has "no quality of life "with his multiple medical problems, while acknowledging that he is depressed, is having been treated for many years, he is ready to die.  He does not want to have VT again No intercurrent VT  Tolerating amio Patient denies symptoms of GI intolerance, sun sensitivity, neurological symptoms attributable to amiodarone.   Surveillance labs necessary   No chest pain, chronic stable sob, mild edema  Past Medical History:  Diagnosis Date  . Atrial fibrillation ([place]) 08/2009  . CAD (coronary artery disease)    s/p CABG -- 1992  . ESRD (end stage renal disease) ([place])   . HLD (hyperlipidemia)   . HTN (hypertension)   . Idiopathic thrombocytopenia ([facility])     Past Surgical History:  Procedure Laterality Date  . APPENDECTOMY    . CORONARY ARTERY BYPASS GRAFT     . FACIAL COSMETIC SURGERY    . GALLBLADDER SURGERY    . HERNIA REPAIR    . KIDNEY TRANSPLANT    . LEFT HEART CATH AND CORONARY ANGIOGRAPHY N/A 08/07/2018   Procedure: LEFT HEART CATH AND CORONARY ANGIOGRAPHY;  Surgeon: Sherren Mocha, MD;  Location: [place] [facility];  Service: Cardiovascular;  Laterality: N/A;  . LIVER TRANSPLANT      Current Outpatient Medications  Medication Sig Dispense Refill  . acetaminophen (TYLENOL) 500 MG tablet Take 1,000 mg by mouth every 6 (six) hours as needed for mild pain or headache.     . albuterol (PROVENTIL) (2.5 MG/3ML) 0.083% nebulizer solution Take 2.5 mg by nebulization every 2 (two) hours as needed for wheezing or shortness of breath.    . allopurinol (ZYLOPRIM) 100 MG tablet Take 100 mg by mouth daily.    [name] amiodarone (PACERONE) 200 MG tablet TAKE 1 TABLET ONCE DAILY. 30 tablet 11  . atorvastatin (LIPITOR) 20 MG tablet Take 20 mg by mouth at bedtime.     . calcitRIOL (ROCALTROL) 0.25 MCG capsule Take 1 capsule by mouth every Monday, Wednesday, and Friday.     . carvedilol (COREG) 25 MG tablet TAKE  1 TABLET TWICE DAILY WITH MEALS. 60 tablet 7  . Cholecalciferol (VITAMIN D3) 25 MCG (1000 UT) CAPS Take 1,000 Units by mouth daily.     . Cyanocobalamin (VITAMIN B12) 500 MCG TABS Take 500 mcg by mouth daily.     . DULoxetine (CYMBALTA) 60 MG capsule Take 60 mg by mouth at bedtime.    [name] EPINEPHrine (EPIPEN) 0.3 mg/0.3 mL DEVI Inject 0.3 mg into the muscle once as needed (severe allergic reaction).     . ferrous sulfate 325 (65 FE) MG tablet Take 325 mg by mouth daily with breakfast.    . fish oil-omega-3 fatty acids 1000 MG capsule Take 2 g by mouth daily.     . fluticasone (FLONASE) 50 MCG/ACT nasal spray Place 2 sprays into both nostrils daily as needed for allergies or rhinitis.     . furosemide (LASIX) 40 MG tablet Take 40 mg by mouth 2 (two) times daily.    . Melatonin 1 MG TABS Take 1 mg by mouth at bedtime.    . metolazone (ZAROXOLYN) 5 MG  tablet Take 0.5 tablets by mouth once a week.     . midodrine (PROAMATINE) 5 MG tablet Take 5 mg by mouth 3 (three) times daily.    . nitroGLYCERIN (NITROSTAT) 0.4 MG SL tablet Place 0.4 mg under the tongue every 5 (five) minutes as needed for chest pain.    . potassium chloride SA (KLOR-CON) 20 MEQ tablet Take 2 tablets by mouth 2 (two) times daily.    . predniSONE (DELTASONE) 5 MG tablet Take 1 tablet (5 mg total) by mouth daily.    . QUEtiapine (SEROQUEL) 50 MG tablet Take 3 tablets by mouth at bedtime.    . tacrolimus (PROGRAF) 0.5 MG capsule Take 0.5 mg by mouth 2 (two) times daily.     . traZODone (DESYREL) 150 MG tablet Take 150 mg by mouth at bedtime as needed for sleep.     [name] warfarin (COUMADIN) 4 MG tablet TAKE AS DIRECTED BY COUMADIN CLINIC. 40 tablet 3   No current facility-administered medications for this visit.     Allergies  Allergen Reactions  . Other Anaphylaxis    Spider venom  . Grapefruit Concentrate Other (See Comments)    Pt has been told not to eat grapefruit  . Haloperidol Other (See Comments)    Caused the patient to be overly-sedated  . Nsaids Other (See Comments)    Cannot take due to liver transplant  . Rifampin Other (See Comments)    Caused flushing  . Triazolam Other (See Comments)    Caused the patient to be overly-sedated      Review of Systems negative except from HPI and PMH  Physical Exam BP 132/60   Pulse 66   Ht 5\' 10"  (1.778 m)   Wt 200 lb 9.6 oz (91 kg)   SpO2 97%   BMI 28.78 kg/m  Well developed and nourished in no acute distress HENT normal Neck supple with JVP-flat Carotids brisk and full without bruits Clear Irregularly irregular rate and rhythm with controlled ventricular response, no murmurs or gallops Abd-soft with active BS without hepatomegaly No Clubbing cyanosis edema Skin-warm and dry A & Oriented  Grossly normal sensory and motor function  ECG: AF  @69             Intervals  -/13/44  Axis 106    Assessment  and  Plan  Atrial fibrillation permanent  CAD  S/p CABG  Congestive heart failure  chronic-systolic/diastolic  High Risk Medication Surveillance Amiodarone  Status post renal transplant\\  Ventricular tachycardia   No intercurrent Ventricular tachycardia  Tolerating amio-- check TSH  On Anticoagulation;  No bleeding issues   AFib with good rate control  End of life and next world discussions   We spent more than 50% of our >25 min visit in face to face counseling regarding the above

## 2019-08-22 NOTE — Patient Instructions (Addendum)
Description   Called spoke with pt, advised pt to continue taking 2mg  daily except 4mg  on Mondays, Wednesdays, and Fridays.  Recheck INR in 1 week for insurance purposes.

## 2019-08-22 NOTE — Patient Instructions (Addendum)
Medication Instructions:  Your physician recommends that you continue on your current medications as directed. Please refer to the Current Medication list given to you today.  Labwork: You will get lab work today:  TSH, CBC, CMP, lipid profile, PSA and Vit. D Order by PCP Dr. Harrell Lark per review of last OV note  Testing/Procedures: None ordered.  Follow-Up: Your physician wants you to follow-up in: 6 months with Dr. [name].   You will receive a reminder letter in the mail two months in advance. If you don't receive a letter, please call our office to schedule the follow-up appointment.   Any Other Special Instructions Will Be Listed Below (If Applicable).  If you need a refill on your cardiac medications before your next appointment, please call your pharmacy.

## 2019-08-23 LAB — COMPREHENSIVE METABOLIC PANEL
ALT: 14 IU/L (ref 0–44)
AST: 15 IU/L (ref 0–40)
Albumin/Globulin Ratio: 1.7 (ref 1.2–2.2)
Albumin: 4.3 g/dL (ref 3.8–4.8)
Alkaline Phosphatase: 68 IU/L (ref 39–117)
BUN/Creatinine Ratio: 17 (ref 10–24)
BUN: 31 mg/dL — ABNORMAL HIGH (ref 8–27)
Bilirubin Total: 1.2 mg/dL (ref 0.0–1.2)
CO2: 29 mmol/L (ref 20–29)
Calcium: 9.9 mg/dL (ref 8.6–10.2)
Chloride: 96 mmol/L (ref 96–106)
Creatinine, Ser: 1.83 mg/dL — ABNORMAL HIGH (ref 0.76–1.27)
GFR calc Af Amer: 43 mL/min/{1.73_m2} — ABNORMAL LOW (ref >59)
GFR calc non Af Amer: 38 mL/min/{1.73_m2} — ABNORMAL LOW (ref >59)
Globulin, Total: 2.5 g/dL (ref 1.5–4.5)
Glucose: 124 mg/dL — ABNORMAL HIGH (ref 65–99)
Potassium: 3.3 mmol/L — ABNORMAL LOW (ref 3.5–5.2)
Sodium: 142 mmol/L (ref 134–144)
Total Protein: 6.8 g/dL (ref 6.0–8.5)

## 2019-08-23 LAB — CBC WITH DIFFERENTIAL/PLATELET
Basophils Absolute: 0 10*3/uL (ref 0.0–0.2)
Basos: 1 %
EOS (ABSOLUTE): 0.1 10*3/uL (ref 0.0–0.4)
Eos: 2 %
Hematocrit: 38.9 % (ref 37.5–51.0)
Hemoglobin: 12.8 g/dL — ABNORMAL LOW (ref 13.0–17.7)
Immature Grans (Abs): 0.1 10*3/uL (ref 0.0–0.1)
Immature Granulocytes: 2 %
Lymphocytes Absolute: 1 10*3/uL (ref 0.7–3.1)
Lymphs: 18 %
MCH: 27.8 pg (ref 26.6–33.0)
MCHC: 32.9 g/dL (ref 31.5–35.7)
MCV: 85 fL (ref 79–97)
Monocytes Absolute: 0.3 10*3/uL (ref 0.1–0.9)
Monocytes: 6 %
Neutrophils Absolute: 4.1 10*3/uL (ref 1.4–7.0)
Neutrophils: 71 %
Platelets: 90 10*3/uL — CL (ref 150–450)
RBC: 4.6 x10E6/uL (ref 4.14–5.80)
RDW: 15.7 % — ABNORMAL HIGH (ref 11.6–15.4)
WBC: 5.7 10*3/uL (ref 3.4–10.8)

## 2019-08-23 LAB — PSA: Prostate Specific Ag, Serum: 0.5 ng/mL (ref 0.0–4.0)

## 2019-08-23 LAB — VITAMIN D 25 HYDROXY (VIT D DEFICIENCY, FRACTURES): Vit D, 25-Hydroxy: 41.1 ng/mL (ref 30.0–100.0)

## 2019-08-23 LAB — LIPID PANEL
Chol/HDL Ratio: 3.8 ratio (ref 0.0–5.0)
Cholesterol, Total: 142 mg/dL (ref 100–199)
HDL: 37 mg/dL — ABNORMAL LOW (ref >39)
LDL Chol Calc (NIH): 87 mg/dL (ref 0–99)
Triglycerides: 96 mg/dL (ref 0–149)
VLDL Cholesterol Cal: 18 mg/dL (ref 5–40)

## 2019-08-23 LAB — TSH: TSH: 2.44 u[IU]/mL (ref 0.450–4.500)

## 2019-08-27 ENCOUNTER — Ambulatory Visit (INDEPENDENT_AMBULATORY_CARE_PROVIDER_SITE_OTHER): Payer: Medicare Other | Admitting: Cardiology

## 2019-08-27 DIAGNOSIS — Z7901 Long term (current) use of anticoagulants: Secondary | ICD-10-CM

## 2019-08-27 DIAGNOSIS — I4821 Permanent atrial fibrillation: Secondary | ICD-10-CM

## 2019-08-27 DIAGNOSIS — Z5181 Encounter for therapeutic drug level monitoring: Secondary | ICD-10-CM

## 2019-08-27 LAB — POCT INR: INR: 2.4 (ref 2.0–3.0)

## 2019-08-31 ENCOUNTER — Telehealth: Payer: Self-pay

## 2019-08-31 NOTE — Telephone Encounter (Signed)
-----   Message from Deboraha Sprang, MD sent at 08/30/2019  8:40 PM EDT ----- Please Inform Patient that labs are normal  Thanks

## 2019-08-31 NOTE — Telephone Encounter (Signed)
See results notes for new medication orders.

## 2019-09-04 LAB — POCT INR: INR: 1.9 — AB (ref 2.0–3.0)

## 2019-09-05 ENCOUNTER — Ambulatory Visit (INDEPENDENT_AMBULATORY_CARE_PROVIDER_SITE_OTHER): Payer: Medicare Other | Admitting: Cardiovascular Disease

## 2019-09-05 DIAGNOSIS — Z5181 Encounter for therapeutic drug level monitoring: Secondary | ICD-10-CM | POA: Diagnosis not present

## 2019-09-05 DIAGNOSIS — Z7901 Long term (current) use of anticoagulants: Secondary | ICD-10-CM | POA: Diagnosis not present

## 2019-09-05 DIAGNOSIS — I4821 Permanent atrial fibrillation: Secondary | ICD-10-CM | POA: Diagnosis not present

## 2019-09-05 NOTE — Patient Instructions (Addendum)
Description   Called spoke with pt, and instructed him to take 1.5 tablets (6mg ) today and then continue taking 2mg  daily except 4mg  on Mondays, Wednesdays, and Fridays. Recheck INR in 1 week for insurance purposes.

## 2019-09-11 ENCOUNTER — Ambulatory Visit (INDEPENDENT_AMBULATORY_CARE_PROVIDER_SITE_OTHER): Payer: Medicare Other | Admitting: Cardiology

## 2019-09-11 DIAGNOSIS — Z5181 Encounter for therapeutic drug level monitoring: Secondary | ICD-10-CM

## 2019-09-11 DIAGNOSIS — Z7901 Long term (current) use of anticoagulants: Secondary | ICD-10-CM

## 2019-09-11 LAB — POCT INR: INR: 1.2 — AB (ref 2.0–3.0)

## 2019-09-20 ENCOUNTER — Ambulatory Visit (INDEPENDENT_AMBULATORY_CARE_PROVIDER_SITE_OTHER): Payer: Medicare Other | Admitting: Cardiology

## 2019-09-20 DIAGNOSIS — Z5181 Encounter for therapeutic drug level monitoring: Secondary | ICD-10-CM

## 2019-09-20 DIAGNOSIS — Z7901 Long term (current) use of anticoagulants: Secondary | ICD-10-CM | POA: Diagnosis not present

## 2019-09-20 LAB — POCT INR: INR: 2.7 (ref 2.0–3.0)

## 2019-09-20 NOTE — Patient Instructions (Signed)
Description   Continue taking taking 4mg  daily except 2mg  on Tuesday, Thursday, Saturday. Recheck INR in 1 week for insurance purposes.

## 2019-09-27 ENCOUNTER — Ambulatory Visit (INDEPENDENT_AMBULATORY_CARE_PROVIDER_SITE_OTHER): Payer: Medicare Other | Admitting: *Deleted

## 2019-09-27 DIAGNOSIS — Z5181 Encounter for therapeutic drug level monitoring: Secondary | ICD-10-CM

## 2019-09-27 DIAGNOSIS — Z7901 Long term (current) use of anticoagulants: Secondary | ICD-10-CM

## 2019-09-27 LAB — POCT INR: INR: 2.2 (ref 2.0–3.0)

## 2019-09-28 NOTE — Patient Instructions (Addendum)
Description    Spoke to pt and instructed him to continue taking taking 4mg  daily except 2mg  on Tuesday, Thursday, Saturday. Recheck INR in 1 week for insurance purposes. Will recheck on 11/25 because office is closed on Thanksgiving.

## 2019-10-03 ENCOUNTER — Ambulatory Visit (INDEPENDENT_AMBULATORY_CARE_PROVIDER_SITE_OTHER): Payer: Medicare Other | Admitting: *Deleted

## 2019-10-03 DIAGNOSIS — Z5181 Encounter for therapeutic drug level monitoring: Secondary | ICD-10-CM | POA: Diagnosis not present

## 2019-10-03 DIAGNOSIS — Z7901 Long term (current) use of anticoagulants: Secondary | ICD-10-CM

## 2019-10-03 LAB — POCT INR: INR: 3.2 — AB (ref 2.0–3.0)

## 2019-10-03 NOTE — Patient Instructions (Signed)
Description   Spoke to pt and instructed him to take 2mg  today then continue taking taking 4mg  daily except 2mg  on Tuesday, Thursday, Saturday. Recheck INR in 1 week for insurance purposes.

## 2019-10-09 ENCOUNTER — Other Ambulatory Visit: Payer: Self-pay | Admitting: Cardiology

## 2019-10-10 ENCOUNTER — Other Ambulatory Visit: Payer: Self-pay | Admitting: *Deleted

## 2019-10-10 MED ORDER — WARFARIN SODIUM 4 MG PO TABS: ORAL_TABLET | ORAL | 3 refills | Status: DC

## 2019-10-11 ENCOUNTER — Ambulatory Visit (INDEPENDENT_AMBULATORY_CARE_PROVIDER_SITE_OTHER): Payer: Medicare Other | Admitting: Cardiovascular Disease

## 2019-10-11 DIAGNOSIS — Z5181 Encounter for therapeutic drug level monitoring: Secondary | ICD-10-CM

## 2019-10-11 LAB — POCT INR: INR: 3.1 — AB (ref 2.0–3.0)

## 2019-10-11 NOTE — Patient Instructions (Signed)
Description   Spoke to pt and instructed him to take 2mg  today then continue taking taking 4mg  daily except 2mg  on Tuesday, Thursday, Saturday. Recheck INR in 1 week for insurance purposes.

## 2019-10-19 ENCOUNTER — Ambulatory Visit (INDEPENDENT_AMBULATORY_CARE_PROVIDER_SITE_OTHER): Payer: Medicare Other | Admitting: *Deleted

## 2019-10-19 DIAGNOSIS — Z7901 Long term (current) use of anticoagulants: Secondary | ICD-10-CM | POA: Diagnosis not present

## 2019-10-19 DIAGNOSIS — Z5181 Encounter for therapeutic drug level monitoring: Secondary | ICD-10-CM

## 2019-10-19 LAB — POCT INR: INR: 3 (ref 2.0–3.0)

## 2019-10-19 NOTE — Patient Instructions (Signed)
Description   Spoke to pt and instructed him to continue taking 4mg  daily except 2mg  on Tuesday, Thursday, Saturday. Reminded pt to eat an extra serving of greens today and remain consistent. Recheck INR in 1 week for insurance purposes.

## 2019-10-30 ENCOUNTER — Ambulatory Visit (INDEPENDENT_AMBULATORY_CARE_PROVIDER_SITE_OTHER): Payer: Medicare Other | Admitting: Internal Medicine

## 2019-10-30 DIAGNOSIS — Z5181 Encounter for therapeutic drug level monitoring: Secondary | ICD-10-CM

## 2019-10-30 LAB — POCT INR: INR: 4.8 — AB (ref 2.0–3.0)

## 2019-10-30 NOTE — Patient Instructions (Signed)
Description   Spoke to pt and instructed him to hold warfarin today and tomorrow, then continue taking 4mg  daily except 2mg  on Tuesday, Thursday, Saturday.  Recheck INR in 1 week for insurance purposes.

## 2019-11-07 ENCOUNTER — Ambulatory Visit (INDEPENDENT_AMBULATORY_CARE_PROVIDER_SITE_OTHER): Payer: Medicare Other | Admitting: Interventional Cardiology

## 2019-11-07 DIAGNOSIS — Z7901 Long term (current) use of anticoagulants: Secondary | ICD-10-CM

## 2019-11-07 DIAGNOSIS — Z5181 Encounter for therapeutic drug level monitoring: Secondary | ICD-10-CM | POA: Diagnosis not present

## 2019-11-07 LAB — POCT INR: INR: 5.3 — AB (ref 2.0–3.0)

## 2019-11-07 NOTE — Progress Notes (Signed)
This encounter was created in error - please disregard.

## 2019-11-07 NOTE — Patient Instructions (Signed)
Description   Self tester. Spoke to pt and instructed him to hold warfarin today and tomorrow, then start taking 2mg  daily except 4mg  on Mondays, Wednesdays, and Fridays.  Recheck INR in 1 week for insurance purposes.

## 2019-11-14 LAB — POCT INR: INR: 4 — AB (ref 2.0–3.0)

## 2019-11-15 ENCOUNTER — Ambulatory Visit (INDEPENDENT_AMBULATORY_CARE_PROVIDER_SITE_OTHER): Payer: Medicare Other | Admitting: Cardiovascular Disease

## 2019-11-15 DIAGNOSIS — Z5181 Encounter for therapeutic drug level monitoring: Secondary | ICD-10-CM | POA: Diagnosis not present

## 2019-11-15 NOTE — Patient Instructions (Signed)
Description   Self tester. Spoke to pt and instructed him to hold warfarin today and then start taking 2mg  daily except 4mg  on Mondays and Fridays.  Recheck INR in 1 week for insurance purposes.

## 2019-11-23 ENCOUNTER — Ambulatory Visit (INDEPENDENT_AMBULATORY_CARE_PROVIDER_SITE_OTHER): Payer: Medicare Other | Admitting: *Deleted

## 2019-11-23 DIAGNOSIS — I4891 Unspecified atrial fibrillation: Secondary | ICD-10-CM

## 2019-11-23 DIAGNOSIS — Z7901 Long term (current) use of anticoagulants: Secondary | ICD-10-CM

## 2019-11-23 DIAGNOSIS — Z5181 Encounter for therapeutic drug level monitoring: Secondary | ICD-10-CM

## 2019-11-23 LAB — POCT INR: INR: 2.6 (ref 2.0–3.0)

## 2019-11-23 NOTE — Patient Instructions (Signed)
Description   Spoke to pt and instructed him to continue taking 2mg  daily except 4mg  on Mondays and Fridays.  Recheck INR in 1 week self tester for insurance purposes.

## 2019-11-29 LAB — POCT INR: INR: 1.9 — AB (ref 2.0–3.0)

## 2019-11-30 ENCOUNTER — Ambulatory Visit (INDEPENDENT_AMBULATORY_CARE_PROVIDER_SITE_OTHER): Payer: Medicare Other | Admitting: *Deleted

## 2019-11-30 DIAGNOSIS — Z5181 Encounter for therapeutic drug level monitoring: Secondary | ICD-10-CM

## 2019-11-30 DIAGNOSIS — Z7901 Long term (current) use of anticoagulants: Secondary | ICD-10-CM

## 2019-12-06 LAB — POCT INR: INR: 2.6 (ref 2.0–3.0)

## 2019-12-07 ENCOUNTER — Ambulatory Visit (INDEPENDENT_AMBULATORY_CARE_PROVIDER_SITE_OTHER): Payer: Medicare Other | Admitting: *Deleted

## 2019-12-07 DIAGNOSIS — I4891 Unspecified atrial fibrillation: Secondary | ICD-10-CM | POA: Diagnosis not present

## 2019-12-07 DIAGNOSIS — Z7901 Long term (current) use of anticoagulants: Secondary | ICD-10-CM | POA: Diagnosis not present

## 2019-12-07 DIAGNOSIS — Z5181 Encounter for therapeutic drug level monitoring: Secondary | ICD-10-CM

## 2019-12-13 LAB — POCT INR: INR: 2.4 (ref 2.0–3.0)

## 2019-12-14 ENCOUNTER — Ambulatory Visit (INDEPENDENT_AMBULATORY_CARE_PROVIDER_SITE_OTHER): Payer: Medicare Other | Admitting: *Deleted

## 2019-12-14 DIAGNOSIS — Z5181 Encounter for therapeutic drug level monitoring: Secondary | ICD-10-CM | POA: Diagnosis not present

## 2019-12-14 DIAGNOSIS — Z7901 Long term (current) use of anticoagulants: Secondary | ICD-10-CM

## 2019-12-14 NOTE — Patient Instructions (Signed)
Description   Spoke to pt and instructed him to continue taking 2mg  daily except 4mg  on Mondays and Fridays.  Recheck INR in 1 week self tester for insurance purposes.

## 2019-12-20 LAB — POCT INR: INR: 2.3 (ref 2.0–3.0)

## 2019-12-21 ENCOUNTER — Ambulatory Visit (INDEPENDENT_AMBULATORY_CARE_PROVIDER_SITE_OTHER): Payer: Medicare Other | Admitting: *Deleted

## 2019-12-21 DIAGNOSIS — Z7901 Long term (current) use of anticoagulants: Secondary | ICD-10-CM

## 2019-12-21 DIAGNOSIS — Z5181 Encounter for therapeutic drug level monitoring: Secondary | ICD-10-CM | POA: Diagnosis not present

## 2019-12-27 LAB — POCT INR: INR: 2.7 (ref 2.0–3.0)

## 2019-12-28 ENCOUNTER — Ambulatory Visit (INDEPENDENT_AMBULATORY_CARE_PROVIDER_SITE_OTHER): Payer: Medicare Other | Admitting: Pharmacist

## 2019-12-28 DIAGNOSIS — Z5181 Encounter for therapeutic drug level monitoring: Secondary | ICD-10-CM

## 2019-12-28 DIAGNOSIS — Z7901 Long term (current) use of anticoagulants: Secondary | ICD-10-CM | POA: Diagnosis not present

## 2020-01-04 ENCOUNTER — Ambulatory Visit (INDEPENDENT_AMBULATORY_CARE_PROVIDER_SITE_OTHER): Payer: Medicare Other

## 2020-01-04 DIAGNOSIS — Z5181 Encounter for therapeutic drug level monitoring: Secondary | ICD-10-CM

## 2020-01-04 DIAGNOSIS — Z7901 Long term (current) use of anticoagulants: Secondary | ICD-10-CM | POA: Diagnosis not present

## 2020-01-04 LAB — POCT INR: INR: 3.3 — AB (ref 2.0–3.0)

## 2020-01-04 NOTE — Patient Instructions (Addendum)
  Description   Spoke to pt and instructed him to skip today's dosage of Coumadin, then resume same dosage 2mg  daily except 4mg  on Mondays and Fridays.  Recheck INR in 1 week self tester for insurance purposes.

## 2020-01-11 ENCOUNTER — Ambulatory Visit (INDEPENDENT_AMBULATORY_CARE_PROVIDER_SITE_OTHER): Payer: Medicare Other | Admitting: *Deleted

## 2020-01-11 DIAGNOSIS — Z5181 Encounter for therapeutic drug level monitoring: Secondary | ICD-10-CM

## 2020-01-11 DIAGNOSIS — Z7901 Long term (current) use of anticoagulants: Secondary | ICD-10-CM

## 2020-01-11 LAB — POCT INR: INR: 2.8 (ref 2.0–3.0)

## 2020-01-11 NOTE — Patient Instructions (Signed)
Description   Spoke to pt and instructed him to continue taking 2mg  daily except 4mg  on Mondays and Fridays.  Recheck INR in 1 week self tester for insurance purposes.

## 2020-01-18 ENCOUNTER — Ambulatory Visit (INDEPENDENT_AMBULATORY_CARE_PROVIDER_SITE_OTHER): Payer: Medicare Other

## 2020-01-18 DIAGNOSIS — Z5181 Encounter for therapeutic drug level monitoring: Secondary | ICD-10-CM

## 2020-01-18 DIAGNOSIS — Z7901 Long term (current) use of anticoagulants: Secondary | ICD-10-CM | POA: Diagnosis not present

## 2020-01-18 LAB — POCT INR: INR: 2.5 (ref 2.0–3.0)

## 2020-01-18 NOTE — Patient Instructions (Signed)
Description   Spoke to pt and instructed him to continue taking 2mg  daily except 4mg  on Mondays and Fridays.  Recheck INR in 1 week self tester for insurance purposes.

## 2020-01-25 ENCOUNTER — Ambulatory Visit (INDEPENDENT_AMBULATORY_CARE_PROVIDER_SITE_OTHER): Payer: Medicare Other | Admitting: *Deleted

## 2020-01-25 DIAGNOSIS — Z5181 Encounter for therapeutic drug level monitoring: Secondary | ICD-10-CM | POA: Diagnosis not present

## 2020-01-25 DIAGNOSIS — Z7901 Long term (current) use of anticoagulants: Secondary | ICD-10-CM

## 2020-01-25 LAB — POCT INR: INR: 2 (ref 2.0–3.0)

## 2020-02-01 ENCOUNTER — Ambulatory Visit (INDEPENDENT_AMBULATORY_CARE_PROVIDER_SITE_OTHER): Payer: Medicare Other | Admitting: *Deleted

## 2020-02-01 DIAGNOSIS — Z5181 Encounter for therapeutic drug level monitoring: Secondary | ICD-10-CM

## 2020-02-01 DIAGNOSIS — Z7901 Long term (current) use of anticoagulants: Secondary | ICD-10-CM | POA: Diagnosis not present

## 2020-02-01 LAB — POCT INR: INR: 1.8 — AB (ref 2.0–3.0)

## 2020-02-08 ENCOUNTER — Ambulatory Visit (INDEPENDENT_AMBULATORY_CARE_PROVIDER_SITE_OTHER): Payer: Medicare Other

## 2020-02-08 DIAGNOSIS — Z7901 Long term (current) use of anticoagulants: Secondary | ICD-10-CM

## 2020-02-08 DIAGNOSIS — Z5181 Encounter for therapeutic drug level monitoring: Secondary | ICD-10-CM

## 2020-02-08 LAB — POCT INR: INR: 3.5 — AB (ref 2.0–3.0)

## 2020-02-08 NOTE — Patient Instructions (Addendum)
  Description   Spoke to pt and instructed him to skip today's dosage of Warfarin, then resume same dosage 2mg  daily except 4mg  on Mondays and Fridays.  Recheck INR in 1 week self tester for insurance purposes.

## 2020-02-15 ENCOUNTER — Ambulatory Visit (INDEPENDENT_AMBULATORY_CARE_PROVIDER_SITE_OTHER): Payer: Medicare Other | Admitting: *Deleted

## 2020-02-15 DIAGNOSIS — Z5181 Encounter for therapeutic drug level monitoring: Secondary | ICD-10-CM | POA: Diagnosis not present

## 2020-02-15 DIAGNOSIS — Z7901 Long term (current) use of anticoagulants: Secondary | ICD-10-CM

## 2020-02-15 LAB — POCT INR: INR: 2.6 (ref 2.0–3.0)

## 2020-02-15 NOTE — Patient Instructions (Signed)
Description   Spoke to pt and instructed him to 2mg  daily except 4mg  on Mondays and Fridays.  Recheck INR in 1 week self tester for insurance purposes.

## 2020-02-22 ENCOUNTER — Ambulatory Visit (INDEPENDENT_AMBULATORY_CARE_PROVIDER_SITE_OTHER): Payer: Medicare Other | Admitting: *Deleted

## 2020-02-22 DIAGNOSIS — Z7901 Long term (current) use of anticoagulants: Secondary | ICD-10-CM

## 2020-02-22 DIAGNOSIS — Z5181 Encounter for therapeutic drug level monitoring: Secondary | ICD-10-CM | POA: Diagnosis not present

## 2020-02-22 LAB — POCT INR: INR: 3.1 — AB (ref 2.0–3.0)

## 2020-02-22 NOTE — Patient Instructions (Signed)
Description   Spoke to pt and instructed him to take 2mg  today then continue taking 2mg  daily except 4mg  on Mondays and Fridays.  Recheck INR in 1 week self tester for insurance purposes.

## 2020-02-29 ENCOUNTER — Ambulatory Visit (INDEPENDENT_AMBULATORY_CARE_PROVIDER_SITE_OTHER): Payer: Medicare Other | Admitting: Internal Medicine

## 2020-02-29 DIAGNOSIS — I4821 Permanent atrial fibrillation: Secondary | ICD-10-CM

## 2020-02-29 DIAGNOSIS — Z5181 Encounter for therapeutic drug level monitoring: Secondary | ICD-10-CM

## 2020-02-29 LAB — POCT INR: INR: 2.8 (ref 2.0–3.0)

## 2020-02-29 NOTE — Patient Instructions (Signed)
Description   Spoke to pt and instructed him to continue taking 2mg  daily except 4mg  on Mondays and Fridays.  Recheck INR in 1 week self tester for insurance purposes.

## 2020-03-03 ENCOUNTER — Encounter: Payer: Self-pay | Admitting: Cardiovascular Disease

## 2020-03-03 NOTE — Progress Notes (Signed)
This encounter was created in error - please disregard. This encounter was created in error - please disregard. This encounter was created in error - please disregard. 

## 2020-03-03 NOTE — Patient Instructions (Signed)
Description   Spoke to pt and instructed him to continue taking 2mg  daily except 4mg  on Mondays and Fridays.  Recheck INR in 1 week self tester for insurance purposes.

## 2020-03-07 ENCOUNTER — Ambulatory Visit (INDEPENDENT_AMBULATORY_CARE_PROVIDER_SITE_OTHER): Payer: Medicare Other | Admitting: *Deleted

## 2020-03-07 DIAGNOSIS — Z5181 Encounter for therapeutic drug level monitoring: Secondary | ICD-10-CM

## 2020-03-07 DIAGNOSIS — Z7901 Long term (current) use of anticoagulants: Secondary | ICD-10-CM

## 2020-03-07 LAB — POCT INR: INR: 2.4 (ref 2.0–3.0)

## 2020-03-07 NOTE — Patient Instructions (Signed)
Description   Spoke to pt and instructed him to continue taking 2mg  daily except 4mg  on Mondays and Fridays.  Recheck INR in 1 week self tester for insurance purposes.

## 2020-03-14 ENCOUNTER — Ambulatory Visit (INDEPENDENT_AMBULATORY_CARE_PROVIDER_SITE_OTHER): Payer: Medicare Other | Admitting: Internal Medicine

## 2020-03-14 DIAGNOSIS — Z5181 Encounter for therapeutic drug level monitoring: Secondary | ICD-10-CM

## 2020-03-14 LAB — POCT INR: INR: 2.6 (ref 2.0–3.0)

## 2020-03-14 NOTE — Patient Instructions (Signed)
Description   Spoke to pt and instructed him to continue taking 2mg  daily except 4mg  on Mondays and Fridays.  Recheck INR in 1 week self tester for insurance purposes.

## 2020-03-17 ENCOUNTER — Encounter: Payer: Self-pay | Admitting: Internal Medicine

## 2020-03-17 ENCOUNTER — Other Ambulatory Visit: Payer: Self-pay

## 2020-03-17 ENCOUNTER — Ambulatory Visit (INDEPENDENT_AMBULATORY_CARE_PROVIDER_SITE_OTHER): Payer: Medicare Other | Admitting: Internal Medicine

## 2020-03-17 VITALS — BP 130/82 | HR 73 | Ht 70.0 in | Wt 191.0 lb

## 2020-03-17 DIAGNOSIS — E039 Hypothyroidism, unspecified: Secondary | ICD-10-CM

## 2020-03-17 DIAGNOSIS — Z79899 Other long term (current) drug therapy: Secondary | ICD-10-CM | POA: Diagnosis not present

## 2020-03-17 DIAGNOSIS — I4821 Permanent atrial fibrillation: Secondary | ICD-10-CM

## 2020-03-17 DIAGNOSIS — I472 Ventricular tachycardia, unspecified: Secondary | ICD-10-CM

## 2020-03-17 MED ORDER — CARVEDILOL 6.25 MG PO TABS
ORAL_TABLET | ORAL | 3 refills | Status: DC
Start: 2020-03-17 — End: 2021-03-11

## 2020-03-17 NOTE — Patient Instructions (Signed)
Medication Instructions:  Your physician has recommended you make the following change in your medication:   Carvedilol 6.25mg  1 tablet by mouth every morning and 2 tablets by mouth every night  *If you need a refill on your cardiac medications before your next appointment, please call your pharmacy*   Lab Work: TSH and Liver panel today  If you have labs (blood work) drawn today and your tests are completely normal, you will receive your results only by: [name] MyChart Message (if you have MyChart) OR . A paper copy in the mail If you have any lab test that is abnormal or we need to change your treatment, we will call you to review the results.   Testing/Procedures: None ordered.    Follow-Up: At [facility], you and your health needs are our priority.  As part of our continuing mission to provide you with exceptional heart care, we have created designated Provider Care Teams.  These Care Teams include your primary Cardiologist (physician) and Advanced Practice Providers (APPs -  Physician Assistants and Nurse Practitioners) who all work together to provide you with the care you need, when you need it.  We recommend signing up for the patient portal called "MyChart".  Sign up information is provided on this After Visit Summary.  MyChart is used to connect with patients for Virtual Visits (Telemedicine).  Patients are able to view lab/test results, encounter notes, upcoming appointments, etc.  Non-urgent messages can be sent to your provider as well.   To learn more about what you can do with MyChart, go to [email or web address].    Your next appointment:   6 months with Dr [name]

## 2020-03-17 NOTE — Progress Notes (Signed)
Patient Care Team: [name]., MD as PCP - General (Family Medicine) Deboraha Sprang, MD as Consulting Physician (Cardiology) Jamal Maes, MD as Consulting Physician (Nephrology)   HPI  [name] is a 69 y.o. male seen in followup for permanent atrial fibrillation in the context of CAD with prior CABG and EF 41%;  he has a history of kidney and liver transplant 2008 and is followed at [facility] and Results Reviewed -HOLTER  6/16  HR excursion quite broad and carvidolol doubled DATE TEST EF   1/16 Myoview  41   anterolateral and inferior scars   9/16 echo  45-50 Reduced RVSF/Mod LAE   2/18 Echo  40-45   11/18 Echo  55-65%   9/19 Echo  35-40%   10/19 LHC  LIMA  SVG R SVG PD patent SVG OM occluded  1/20 Echo  35-40% LVH 13-11mm    Was hospitalized 9/19 with ventricular tachycardia.  He was seen in consult by Dr. Greggory Brandy.  He has previously had been 55-60% and beta-blockers were initiated.  With repeat echo demonstrating EF 40% or so it was elected to put him on amiodarone.     Patient denies symptoms of GI intolerance, sun sensitivity, neurological symptoms attributable to amiodarone.     Date Cr K Hgb TSH LFTs  1/20 1.75 3.7        8/20 1.52 3.8    14  3/21 2.02 3.3 14.1        The patient is a DNR.  He has "no quality of life "with his multiple medical problems, while acknowledging that he is depressed, is having been treated for many years, he is ready to die.  He does not want to have VT again No intercurrent VT  Tolerating amio Patient denies symptoms of GI intolerance, sun sensitivity, neurological symptoms attributable to amiodarone.   Surveillance labs necessary   No chest pain, chronic stable sob, mild edema  Past Medical History:  Diagnosis Date  . Atrial fibrillation ([place]) 08/2009  . CAD (coronary artery disease)    s/p CABG -- 1992  . ESRD (end stage renal disease) ([place])   . HLD (hyperlipidemia)   . HTN (hypertension)   .  Idiopathic thrombocytopenia ([facility])     Past Surgical History:  Procedure Laterality Date  . APPENDECTOMY    . CORONARY ARTERY BYPASS GRAFT    . FACIAL COSMETIC SURGERY    . GALLBLADDER SURGERY    . HERNIA REPAIR    . KIDNEY TRANSPLANT    . LEFT HEART CATH AND CORONARY ANGIOGRAPHY N/A 08/07/2018   Procedure: LEFT HEART CATH AND CORONARY ANGIOGRAPHY;  Surgeon: Sherren Mocha, MD;  Location: [place] [facility];  Service: Cardiovascular;  Laterality: N/A;  . LIVER TRANSPLANT      Current Outpatient Medications  Medication Sig Dispense Refill  . acetaminophen (TYLENOL) 500 MG tablet Take 1,000 mg by mouth every 6 (six) hours as needed for mild pain or headache.     . albuterol (PROVENTIL) (2.5 MG/3ML) 0.083% nebulizer solution Take 2.5 mg by nebulization every 2 (two) hours as needed for wheezing or shortness of breath.    . allopurinol (ZYLOPRIM) 100 MG tablet Take 100 mg by mouth daily.    [name] amiodarone (PACERONE) 200 MG tablet TAKE 1 TABLET BY MOUTH ONCE DAILY 30 tablet 11  . atorvastatin (LIPITOR) 20 MG tablet Take 20 mg by mouth at bedtime.     [name]  calcitRIOL (ROCALTROL) 0.25 MCG capsule Take 1 capsule by mouth daily.     . carvedilol (COREG) 25 MG tablet Take 12.5 mg by mouth 2 (two) times daily with a meal.    . Cholecalciferol (VITAMIN D3) 25 MCG (1000 UT) CAPS Take 1,000 Units by mouth daily.     . Cyanocobalamin (VITAMIN B12) 500 MCG TABS Take 500 mcg by mouth daily.     . DULoxetine (CYMBALTA) 60 MG capsule Take 60 mg by mouth at bedtime.    [name] EPINEPHrine (EPIPEN) 0.3 mg/0.3 mL DEVI Inject 0.3 mg into the muscle once as needed (severe allergic reaction).     . ferrous sulfate 325 (65 FE) MG tablet Take 325 mg by mouth daily with breakfast.    . fish oil-omega-3 fatty acids 1000 MG capsule Take 2 g by mouth daily.     . fluticasone (FLONASE) 50 MCG/ACT nasal spray Place 2 sprays into both nostrils daily as needed for allergies or rhinitis.     . furosemide (LASIX) 40 MG tablet Take  40 mg by mouth 2 (two) times daily.    . Melatonin 1 MG TABS Take 1 mg by mouth at bedtime.    . metolazone (ZAROXOLYN) 5 MG tablet Take 0.5 tablets by mouth once a week.     . midodrine (PROAMATINE) 5 MG tablet Take 5 mg by mouth 3 (three) times daily.    . nitroGLYCERIN (NITROSTAT) 0.4 MG SL tablet Place 0.4 mg under the tongue every 5 (five) minutes as needed for chest pain.    . potassium chloride SA (KLOR-CON) 20 MEQ tablet Take 2 tablets by mouth 2 (two) times daily. Pt is to take 5mEq K on days he takes Metolazone.     . predniSONE (DELTASONE) 5 MG tablet Take 1 tablet (5 mg total) by mouth daily.    . QUEtiapine (SEROQUEL) 50 MG tablet Take 3 tablets by mouth at bedtime.    . tacrolimus (PROGRAF) 0.5 MG capsule Take 0.5 mg by mouth 2 (two) times daily.     . traZODone (DESYREL) 150 MG tablet Take 150 mg by mouth at bedtime as needed for sleep.     [name] warfarin (COUMADIN) 4 MG tablet TAKE AS DIRECTED BY COUMADIN CLINIC. 40 tablet 3   No current facility-administered medications for this visit.    Allergies  Allergen Reactions  . Other Anaphylaxis    Spider venom  . Grapefruit Concentrate Other (See Comments)    Pt has been told not to eat grapefruit  . Haloperidol Other (See Comments)    Caused the patient to be overly-sedated  . Nsaids Other (See Comments)    Cannot take due to liver transplant  . Rifampin Other (See Comments)    Caused flushing  . Triazolam Other (See Comments)    Caused the patient to be overly-sedated      Review of Systems negative except from HPI and PMH  Physical Exam BP 130/82   Pulse 73   Ht 5\' 10"  (1.778 m)   Wt 191 lb (86.6 kg)   SpO2 97%   BMI 27.41 kg/m  Well developed and nourished in no acute distress HENT normal Neck supple with JVP-flat Carotids brisk and full without bruits Clear Irregularly irregular rate and rhythm with controlled  ventricular response, no murmurs or gallops Abd-soft with active BS without hepatomegaly No  Clubbing cyanosis edema Skin-warm and dry A & Oriented  Grossly normal sensory and motor function  ECG afib 73 -/13/47  Assessment and  Plan  Atrial fibrillation permanent  CAD  S/p CABG  Congestive heart failure  chronic-systolic/diastolic  High Risk Medication Surveillance Amiodarone  Status post renal transplant\\  Ventricular tachycardia  End of LIfe discussion    Atrial fibrillation with good rate control.  Had a low this effort for her talk about  End of life discussions regarding DNR status, which he affirms but would also like to continue amiodarone in the hope of no more VT  He is having falls and orthostatic lightheadedness.  He brings in blood pressure recordings ranging from 170--01 systolic.  We will have him decrease his a.m. carvedilol from 12.5--6.25 and have him increase his ProAmatine from 5 mg in the morning 5 in the evening and 5 at bedtime to 5 in the morning i.e. when he wakes up at noon and 10 mg at about 5:00 in anticipation of the postprandial hypotension that he describes.  Tolerating amiodarone.  No clinical interval ventricular tachycardia.  We will check amiodarone surveillance laboratories.  On Anticoagulation;  No bleeding issues

## 2020-03-18 LAB — COMPREHENSIVE METABOLIC PANEL
ALT: 18 IU/L (ref 0–44)
AST: 20 IU/L (ref 0–40)
Albumin/Globulin Ratio: 1.6 (ref 1.2–2.2)
Albumin: 4.3 g/dL (ref 3.8–4.8)
Alkaline Phosphatase: 83 IU/L (ref 39–117)
BUN/Creatinine Ratio: 12 (ref 10–24)
BUN: 25 mg/dL (ref 8–27)
Bilirubin Total: 1.5 mg/dL — ABNORMAL HIGH (ref 0.0–1.2)
CO2: 28 mmol/L (ref 20–29)
Calcium: 9.8 mg/dL (ref 8.6–10.2)
Chloride: 97 mmol/L (ref 96–106)
Creatinine, Ser: 2.1 mg/dL — ABNORMAL HIGH (ref 0.76–1.27)
GFR calc Af Amer: 37 mL/min/{1.73_m2} — ABNORMAL LOW (ref >59)
GFR calc non Af Amer: 32 mL/min/{1.73_m2} — ABNORMAL LOW (ref >59)
Globulin, Total: 2.7 g/dL (ref 1.5–4.5)
Glucose: 128 mg/dL — ABNORMAL HIGH (ref 65–99)
Potassium: 4.1 mmol/L (ref 3.5–5.2)
Sodium: 141 mmol/L (ref 134–144)
Total Protein: 7 g/dL (ref 6.0–8.5)

## 2020-03-18 LAB — TSH: TSH: 2.49 u[IU]/mL (ref 0.450–4.500)

## 2020-03-18 LAB — HEPATIC FUNCTION PANEL: Bilirubin, Direct: 0.37 mg/dL (ref 0.00–0.40)

## 2020-03-21 ENCOUNTER — Ambulatory Visit (INDEPENDENT_AMBULATORY_CARE_PROVIDER_SITE_OTHER): Payer: Medicare Other | Admitting: *Deleted

## 2020-03-21 DIAGNOSIS — Z7901 Long term (current) use of anticoagulants: Secondary | ICD-10-CM

## 2020-03-21 DIAGNOSIS — Z5181 Encounter for therapeutic drug level monitoring: Secondary | ICD-10-CM | POA: Diagnosis not present

## 2020-03-21 LAB — POCT INR: INR: 2.5 (ref 2.0–3.0)

## 2020-03-21 NOTE — Patient Instructions (Signed)
Description   Spoke to pt and instructed him to continue taking 2mg  daily except 4mg  on Mondays and Fridays.  Recheck INR in 1 week self tester for insurance purposes.

## 2020-03-25 ENCOUNTER — Other Ambulatory Visit: Payer: Self-pay | Admitting: Internal Medicine

## 2020-03-27 ENCOUNTER — Telehealth: Payer: Self-pay | Admitting: Internal Medicine

## 2020-03-27 NOTE — Telephone Encounter (Signed)
New message   Patient is returning call about lab results. Please call.

## 2020-03-28 ENCOUNTER — Ambulatory Visit (INDEPENDENT_AMBULATORY_CARE_PROVIDER_SITE_OTHER): Payer: Medicare Other | Admitting: *Deleted

## 2020-03-28 DIAGNOSIS — Z5181 Encounter for therapeutic drug level monitoring: Secondary | ICD-10-CM

## 2020-03-28 DIAGNOSIS — Z7901 Long term (current) use of anticoagulants: Secondary | ICD-10-CM

## 2020-03-28 DIAGNOSIS — D469 Myelodysplastic syndrome, unspecified: Secondary | ICD-10-CM

## 2020-03-28 LAB — POCT INR: INR: 1.9 — AB (ref 2.0–3.0)

## 2020-03-28 NOTE — Patient Instructions (Signed)
Description   Spoke to pt and instructed him to take 6mg  today then continue taking 2mg  daily except 4mg  on Mondays and Fridays.  Recheck INR in 1 week self tester for insurance purposes.

## 2020-04-01 NOTE — Telephone Encounter (Signed)
See lab result note dated 03/27/2020, have discussed lab results with pt.  Pt has verbalized understanding and thanked Therapist, sports for phone call.

## 2020-04-06 LAB — POCT INR: INR: 3.6 — AB (ref 2.0–3.0)

## 2020-04-08 ENCOUNTER — Ambulatory Visit (INDEPENDENT_AMBULATORY_CARE_PROVIDER_SITE_OTHER): Payer: Medicare Other | Admitting: *Deleted

## 2020-04-08 DIAGNOSIS — I4891 Unspecified atrial fibrillation: Secondary | ICD-10-CM

## 2020-04-08 DIAGNOSIS — Z5181 Encounter for therapeutic drug level monitoring: Secondary | ICD-10-CM

## 2020-04-08 DIAGNOSIS — Z7901 Long term (current) use of anticoagulants: Secondary | ICD-10-CM

## 2020-04-08 NOTE — Patient Instructions (Signed)
Description   Spoke to pt and instructed him to hold today's dose then continue taking 2mg  daily except 4mg  on Mondays and Fridays.  Recheck INR in 1 week self tester for insurance purposes.

## 2020-04-11 ENCOUNTER — Ambulatory Visit (INDEPENDENT_AMBULATORY_CARE_PROVIDER_SITE_OTHER): Payer: Medicare Other | Admitting: *Deleted

## 2020-04-11 DIAGNOSIS — Z5181 Encounter for therapeutic drug level monitoring: Secondary | ICD-10-CM

## 2020-04-11 DIAGNOSIS — Z7901 Long term (current) use of anticoagulants: Secondary | ICD-10-CM

## 2020-04-11 LAB — POCT INR: INR: 3.1 — AB (ref 2.0–3.0)

## 2020-04-11 NOTE — Patient Instructions (Signed)
Description   Spoke to pt and instructed him to start taking 2mg  daily except 4mg  on Mondays. Recheck INR in 1 week self tester for insurance purposes.

## 2020-04-18 ENCOUNTER — Ambulatory Visit (INDEPENDENT_AMBULATORY_CARE_PROVIDER_SITE_OTHER): Payer: Medicare Other | Admitting: Interventional Cardiology

## 2020-04-18 DIAGNOSIS — Z5181 Encounter for therapeutic drug level monitoring: Secondary | ICD-10-CM | POA: Diagnosis not present

## 2020-04-18 LAB — POCT INR: INR: 2.6 (ref 2.0–3.0)

## 2020-04-18 NOTE — Patient Instructions (Signed)
Description   Spoke to pt and instructed him to continue taking 2mg  daily except 4mg  on Mondays. Recheck INR in 1 week self tester for insurance purposes.

## 2020-04-25 LAB — POCT INR: INR: 3.4 — AB (ref 2.0–3.0)

## 2020-04-28 ENCOUNTER — Ambulatory Visit (INDEPENDENT_AMBULATORY_CARE_PROVIDER_SITE_OTHER): Payer: Medicare Other

## 2020-04-28 DIAGNOSIS — Z5181 Encounter for therapeutic drug level monitoring: Secondary | ICD-10-CM

## 2020-04-28 DIAGNOSIS — Z7901 Long term (current) use of anticoagulants: Secondary | ICD-10-CM

## 2020-04-28 NOTE — Patient Instructions (Signed)
Description   Called spoke to pt, pt checked INR on Friday, but we never received INR results.  Pt states INR was 3.4, instructed him to skip today's dosage of Warfarin, then resume same dosage 2mg  daily except 4mg  on Mondays. Recheck INR in 1 week self tester for insurance purposes.

## 2020-05-02 ENCOUNTER — Ambulatory Visit (INDEPENDENT_AMBULATORY_CARE_PROVIDER_SITE_OTHER): Payer: Medicare Other | Admitting: *Deleted

## 2020-05-02 ENCOUNTER — Encounter: Payer: Self-pay | Admitting: *Deleted

## 2020-05-02 DIAGNOSIS — Z5181 Encounter for therapeutic drug level monitoring: Secondary | ICD-10-CM

## 2020-05-02 DIAGNOSIS — Z7901 Long term (current) use of anticoagulants: Secondary | ICD-10-CM

## 2020-05-02 LAB — POCT INR: INR: 1.8 — AB (ref 2.0–3.0)

## 2020-05-02 NOTE — Progress Notes (Signed)
This encounter was created in error - please disregard.

## 2020-05-09 ENCOUNTER — Ambulatory Visit (INDEPENDENT_AMBULATORY_CARE_PROVIDER_SITE_OTHER): Payer: Medicare Other | Admitting: Internal Medicine

## 2020-05-09 DIAGNOSIS — Z5181 Encounter for therapeutic drug level monitoring: Secondary | ICD-10-CM | POA: Diagnosis not present

## 2020-05-09 LAB — POCT INR: INR: 2.2 (ref 2.0–3.0)

## 2020-05-09 NOTE — Patient Instructions (Signed)
Description   Called spoke to pt and instructed pt to continue taking 2 mg daily except for 4 mg on Monday and Thursdays. Recheck INR in 1 week self tester for insurance purposes.

## 2020-05-18 LAB — POCT INR: INR: 2.4 (ref 2.0–3.0)

## 2020-05-19 ENCOUNTER — Ambulatory Visit (INDEPENDENT_AMBULATORY_CARE_PROVIDER_SITE_OTHER): Payer: Medicare Other | Admitting: *Deleted

## 2020-05-19 DIAGNOSIS — Z7901 Long term (current) use of anticoagulants: Secondary | ICD-10-CM

## 2020-05-19 DIAGNOSIS — I4821 Permanent atrial fibrillation: Secondary | ICD-10-CM | POA: Diagnosis not present

## 2020-05-19 DIAGNOSIS — Z5181 Encounter for therapeutic drug level monitoring: Secondary | ICD-10-CM

## 2020-05-27 ENCOUNTER — Ambulatory Visit (INDEPENDENT_AMBULATORY_CARE_PROVIDER_SITE_OTHER): Payer: Medicare Other | Admitting: *Deleted

## 2020-05-27 DIAGNOSIS — I4821 Permanent atrial fibrillation: Secondary | ICD-10-CM

## 2020-05-27 DIAGNOSIS — Z5181 Encounter for therapeutic drug level monitoring: Secondary | ICD-10-CM

## 2020-05-27 DIAGNOSIS — Z7901 Long term (current) use of anticoagulants: Secondary | ICD-10-CM

## 2020-05-27 LAB — POCT INR: INR: 2.2 (ref 2.0–3.0)

## 2020-06-04 ENCOUNTER — Ambulatory Visit (INDEPENDENT_AMBULATORY_CARE_PROVIDER_SITE_OTHER): Payer: Medicare Other | Admitting: *Deleted

## 2020-06-04 DIAGNOSIS — Z5181 Encounter for therapeutic drug level monitoring: Secondary | ICD-10-CM | POA: Diagnosis not present

## 2020-06-04 DIAGNOSIS — I4891 Unspecified atrial fibrillation: Secondary | ICD-10-CM | POA: Diagnosis not present

## 2020-06-04 LAB — POCT INR: INR: 3.1 — AB (ref 2.0–3.0)

## 2020-06-13 ENCOUNTER — Ambulatory Visit (INDEPENDENT_AMBULATORY_CARE_PROVIDER_SITE_OTHER): Payer: Medicare Other | Admitting: Pharmacist

## 2020-06-13 DIAGNOSIS — Z7901 Long term (current) use of anticoagulants: Secondary | ICD-10-CM

## 2020-06-13 DIAGNOSIS — Z5181 Encounter for therapeutic drug level monitoring: Secondary | ICD-10-CM

## 2020-06-13 LAB — POCT INR: INR: 1.6 — AB (ref 2.0–3.0)

## 2020-06-13 NOTE — Patient Instructions (Signed)
Description   Called pt and advised him to take 6mg  of warfarin today and 4mg  tomorrow, then continue taking 2 mg daily except for 4 mg on Mondays and Fridays. Recheck INR in 1 week self tester for insurance purposes.

## 2020-06-24 ENCOUNTER — Ambulatory Visit (INDEPENDENT_AMBULATORY_CARE_PROVIDER_SITE_OTHER): Payer: Medicare Other | Admitting: *Deleted

## 2020-06-24 DIAGNOSIS — I4821 Permanent atrial fibrillation: Secondary | ICD-10-CM | POA: Diagnosis not present

## 2020-06-24 DIAGNOSIS — Z7901 Long term (current) use of anticoagulants: Secondary | ICD-10-CM

## 2020-06-24 DIAGNOSIS — Z5181 Encounter for therapeutic drug level monitoring: Secondary | ICD-10-CM | POA: Diagnosis not present

## 2020-06-24 LAB — POCT INR: INR: 3.2 — AB (ref 2.0–3.0)

## 2020-06-24 NOTE — Patient Instructions (Signed)
Description   Called pt and advised him to hold today's dose then continue taking 2 mg daily except for 4 mg on Mondays and Fridays. Recheck INR in 1 week self tester for insurance purposes.

## 2020-07-01 ENCOUNTER — Ambulatory Visit (INDEPENDENT_AMBULATORY_CARE_PROVIDER_SITE_OTHER): Payer: Medicare Other | Admitting: *Deleted

## 2020-07-01 DIAGNOSIS — I4821 Permanent atrial fibrillation: Secondary | ICD-10-CM | POA: Diagnosis not present

## 2020-07-01 DIAGNOSIS — Z5181 Encounter for therapeutic drug level monitoring: Secondary | ICD-10-CM

## 2020-07-01 DIAGNOSIS — Z7901 Long term (current) use of anticoagulants: Secondary | ICD-10-CM

## 2020-07-01 LAB — POCT INR: INR: 2.5 (ref 2.0–3.0)

## 2020-07-01 NOTE — Patient Instructions (Signed)
Description   Called pt and advised him to continue taking 2 mg daily except for 4 mg on Mondays and Fridays. Recheck INR in 1 week self tester for insurance purposes.

## 2020-07-07 ENCOUNTER — Ambulatory Visit (INDEPENDENT_AMBULATORY_CARE_PROVIDER_SITE_OTHER): Payer: Medicare Other | Admitting: *Deleted

## 2020-07-07 DIAGNOSIS — Z7901 Long term (current) use of anticoagulants: Secondary | ICD-10-CM | POA: Diagnosis not present

## 2020-07-07 DIAGNOSIS — Z5181 Encounter for therapeutic drug level monitoring: Secondary | ICD-10-CM

## 2020-07-07 DIAGNOSIS — I4821 Permanent atrial fibrillation: Secondary | ICD-10-CM | POA: Diagnosis not present

## 2020-07-07 LAB — POCT INR: INR: 3.1 — AB (ref 2.0–3.0)

## 2020-07-07 NOTE — Patient Instructions (Signed)
Description   Called pt and advised him to take 2mg  today then continue taking 2 mg daily except for 4 mg on Mondays and Fridays. Recheck INR in 1 week self tester for insurance purposes.

## 2020-07-16 ENCOUNTER — Ambulatory Visit (INDEPENDENT_AMBULATORY_CARE_PROVIDER_SITE_OTHER): Payer: Medicare Other | Admitting: *Deleted

## 2020-07-16 DIAGNOSIS — Z5181 Encounter for therapeutic drug level monitoring: Secondary | ICD-10-CM

## 2020-07-16 DIAGNOSIS — Z7901 Long term (current) use of anticoagulants: Secondary | ICD-10-CM

## 2020-07-16 LAB — POCT INR: INR: 2.7 (ref 2.0–3.0)

## 2020-07-16 NOTE — Patient Instructions (Signed)
Description   Called pt and advised him to continue taking 2mg  daily except for 4mg  on Mondays and Fridays. Recheck INR in 1 week self tester for insurance purposes.

## 2020-07-23 ENCOUNTER — Ambulatory Visit (INDEPENDENT_AMBULATORY_CARE_PROVIDER_SITE_OTHER): Payer: Medicare Other | Admitting: Internal Medicine

## 2020-07-23 DIAGNOSIS — Z5181 Encounter for therapeutic drug level monitoring: Secondary | ICD-10-CM | POA: Diagnosis not present

## 2020-07-23 LAB — POCT INR: INR: 3.9 — AB (ref 2.0–3.0)

## 2020-07-23 NOTE — Patient Instructions (Signed)
Description   Called pt and advised him to hold warfarin today and then continue taking 2mg  daily except for 4mg  on Mondays and Fridays. Recheck INR in 1 week self tester for insurance purposes.

## 2020-07-30 ENCOUNTER — Ambulatory Visit (INDEPENDENT_AMBULATORY_CARE_PROVIDER_SITE_OTHER): Payer: Medicare Other | Admitting: *Deleted

## 2020-07-30 DIAGNOSIS — Z5181 Encounter for therapeutic drug level monitoring: Secondary | ICD-10-CM

## 2020-07-30 DIAGNOSIS — Z7901 Long term (current) use of anticoagulants: Secondary | ICD-10-CM

## 2020-07-30 LAB — POCT INR: INR: 4.1 — AB (ref 2.0–3.0)

## 2020-07-30 NOTE — Patient Instructions (Addendum)
Description   Called pt and advised him to hold warfarin today and then start taking 2mg  daily except for 4mg  on Mondays. Have leafy veggies today and remain consistent. Recheck INR in 1 week self tester for insurance purposes.

## 2020-08-07 ENCOUNTER — Ambulatory Visit (INDEPENDENT_AMBULATORY_CARE_PROVIDER_SITE_OTHER): Payer: Medicare Other

## 2020-08-07 DIAGNOSIS — Z5181 Encounter for therapeutic drug level monitoring: Secondary | ICD-10-CM

## 2020-08-07 DIAGNOSIS — Z7901 Long term (current) use of anticoagulants: Secondary | ICD-10-CM | POA: Diagnosis not present

## 2020-08-07 LAB — POCT INR: INR: 3.2 — AB (ref 2.0–3.0)

## 2020-08-07 NOTE — Patient Instructions (Signed)
Description   Called pt and advised him to hold warfarin today and then resume same dosage 2mg  daily except for 4mg  on Mondays. Remain consistent on your green leafy vegetables. Recheck INR in 1 week self tester for insurance purposes.

## 2020-08-15 ENCOUNTER — Ambulatory Visit (INDEPENDENT_AMBULATORY_CARE_PROVIDER_SITE_OTHER): Payer: Medicare Other

## 2020-08-15 DIAGNOSIS — Z7901 Long term (current) use of anticoagulants: Secondary | ICD-10-CM

## 2020-08-15 DIAGNOSIS — Z5181 Encounter for therapeutic drug level monitoring: Secondary | ICD-10-CM | POA: Diagnosis not present

## 2020-08-15 LAB — POCT INR: INR: 2.8 (ref 2.0–3.0)

## 2020-08-15 NOTE — Patient Instructions (Signed)
Description   Called pt and advised him to continue on same dosage 2mg  daily except for 4mg  on Mondays. Remain consistent on your green leafy vegetables. Recheck INR in 1 week self tester for insurance purposes.

## 2020-08-22 ENCOUNTER — Ambulatory Visit (INDEPENDENT_AMBULATORY_CARE_PROVIDER_SITE_OTHER): Payer: Medicare Other | Admitting: *Deleted

## 2020-08-22 DIAGNOSIS — Z5181 Encounter for therapeutic drug level monitoring: Secondary | ICD-10-CM | POA: Diagnosis not present

## 2020-08-22 DIAGNOSIS — Z7901 Long term (current) use of anticoagulants: Secondary | ICD-10-CM

## 2020-08-22 LAB — POCT INR: INR: 2.9 (ref 2.0–3.0)

## 2020-08-29 ENCOUNTER — Ambulatory Visit (INDEPENDENT_AMBULATORY_CARE_PROVIDER_SITE_OTHER): Payer: Medicare Other | Admitting: Pharmacist

## 2020-08-29 DIAGNOSIS — Z7901 Long term (current) use of anticoagulants: Secondary | ICD-10-CM

## 2020-08-29 DIAGNOSIS — I4821 Permanent atrial fibrillation: Secondary | ICD-10-CM

## 2020-08-29 DIAGNOSIS — Z5181 Encounter for therapeutic drug level monitoring: Secondary | ICD-10-CM

## 2020-08-29 LAB — POCT INR: INR: 2.9 (ref 2.0–3.0)

## 2020-08-29 NOTE — Patient Instructions (Signed)
Description   Called pt and advised him to continue on same dosage 2mg  daily except for 4mg  on Mondays. Remain consistent on your green leafy vegetables. Recheck INR in 1 week self tester for insurance purposes.

## 2020-09-05 ENCOUNTER — Ambulatory Visit (INDEPENDENT_AMBULATORY_CARE_PROVIDER_SITE_OTHER): Payer: Medicare Other | Admitting: *Deleted

## 2020-09-05 DIAGNOSIS — Z5181 Encounter for therapeutic drug level monitoring: Secondary | ICD-10-CM | POA: Diagnosis not present

## 2020-09-05 DIAGNOSIS — Z7901 Long term (current) use of anticoagulants: Secondary | ICD-10-CM

## 2020-09-05 LAB — POCT INR: INR: 2.7 (ref 2.0–3.0)

## 2020-09-12 ENCOUNTER — Ambulatory Visit (INDEPENDENT_AMBULATORY_CARE_PROVIDER_SITE_OTHER): Payer: Medicare Other | Admitting: Internal Medicine

## 2020-09-12 DIAGNOSIS — Z5181 Encounter for therapeutic drug level monitoring: Secondary | ICD-10-CM

## 2020-09-12 LAB — POCT INR: INR: 2.6 (ref 2.0–3.0)

## 2020-09-12 NOTE — Patient Instructions (Signed)
Description   Called pt and advised him to continue on same dosage 2mg  daily except for 4mg  on Mondays. Remain consistent on your green leafy vegetables. Recheck INR in 1 week self tester for insurance purposes.

## 2020-09-18 ENCOUNTER — Other Ambulatory Visit: Payer: Self-pay | Admitting: Internal Medicine

## 2020-09-19 ENCOUNTER — Other Ambulatory Visit: Payer: Self-pay | Admitting: Internal Medicine

## 2020-09-20 LAB — POCT INR: INR: 2.2 (ref 2.0–3.0)

## 2020-09-22 ENCOUNTER — Ambulatory Visit (INDEPENDENT_AMBULATORY_CARE_PROVIDER_SITE_OTHER): Payer: Medicare Other | Admitting: *Deleted

## 2020-09-22 DIAGNOSIS — Z7901 Long term (current) use of anticoagulants: Secondary | ICD-10-CM

## 2020-09-22 DIAGNOSIS — I4891 Unspecified atrial fibrillation: Secondary | ICD-10-CM

## 2020-09-22 DIAGNOSIS — Z5181 Encounter for therapeutic drug level monitoring: Secondary | ICD-10-CM

## 2020-09-22 NOTE — Patient Instructions (Signed)
Description   Called pt and advised him to continue on same dosage 2mg  daily except for 4mg  on Mondays. Remain consistent on your green leafy vegetables. Recheck INR in 1 week self tester for insurance purposes.

## 2020-09-28 LAB — POCT INR: INR: 2.3 (ref 2.0–3.0)

## 2020-09-29 ENCOUNTER — Ambulatory Visit (INDEPENDENT_AMBULATORY_CARE_PROVIDER_SITE_OTHER): Payer: Medicare Other | Admitting: Pharmacist

## 2020-09-29 DIAGNOSIS — Z5181 Encounter for therapeutic drug level monitoring: Secondary | ICD-10-CM | POA: Diagnosis not present

## 2020-09-29 DIAGNOSIS — Z7901 Long term (current) use of anticoagulants: Secondary | ICD-10-CM

## 2020-10-05 LAB — POCT INR: INR: 1.9 — AB (ref 2.0–3.0)

## 2020-10-06 ENCOUNTER — Ambulatory Visit (INDEPENDENT_AMBULATORY_CARE_PROVIDER_SITE_OTHER): Payer: Medicare Other | Admitting: Pharmacist

## 2020-10-06 DIAGNOSIS — Z7901 Long term (current) use of anticoagulants: Secondary | ICD-10-CM | POA: Diagnosis not present

## 2020-10-06 DIAGNOSIS — Z5181 Encounter for therapeutic drug level monitoring: Secondary | ICD-10-CM | POA: Diagnosis not present

## 2020-10-12 LAB — POCT INR: INR: 3.1 — AB (ref 2.0–3.0)

## 2020-10-13 ENCOUNTER — Ambulatory Visit (INDEPENDENT_AMBULATORY_CARE_PROVIDER_SITE_OTHER): Payer: Medicare Other | Admitting: Cardiovascular Disease

## 2020-10-13 DIAGNOSIS — Z5181 Encounter for therapeutic drug level monitoring: Secondary | ICD-10-CM | POA: Diagnosis not present

## 2020-10-13 NOTE — Patient Instructions (Signed)
Description   Called pt and advised patient to take 2mg  today then continue on same dosage 2mg  daily except for 4mg  on Mondays. Remain consistent on your green leafy vegetables. Recheck INR in 1 week self tester for insurance purposes.

## 2020-10-20 ENCOUNTER — Other Ambulatory Visit: Payer: Self-pay | Admitting: Internal Medicine

## 2020-10-20 LAB — POCT INR: INR: 3.2 — AB (ref 2.0–3.0)

## 2020-10-21 ENCOUNTER — Ambulatory Visit (INDEPENDENT_AMBULATORY_CARE_PROVIDER_SITE_OTHER): Payer: Medicare Other | Admitting: *Deleted

## 2020-10-21 DIAGNOSIS — Z5181 Encounter for therapeutic drug level monitoring: Secondary | ICD-10-CM | POA: Diagnosis not present

## 2020-10-21 DIAGNOSIS — Z7901 Long term (current) use of anticoagulants: Secondary | ICD-10-CM

## 2020-10-21 NOTE — Patient Instructions (Addendum)
Description   Called pt and advised patient to hold today's dose then start taking 2mg  daily. Remain consistent on your green leafy vegetables. Recheck INR in 1 week self tester for insurance purposes.

## 2020-10-27 LAB — POCT INR: INR: 1.9 — AB (ref 2.0–3.0)

## 2020-10-28 ENCOUNTER — Ambulatory Visit (INDEPENDENT_AMBULATORY_CARE_PROVIDER_SITE_OTHER): Payer: Medicare Other | Admitting: Pharmacist

## 2020-10-28 DIAGNOSIS — Z7901 Long term (current) use of anticoagulants: Secondary | ICD-10-CM | POA: Diagnosis not present

## 2020-10-28 DIAGNOSIS — Z5181 Encounter for therapeutic drug level monitoring: Secondary | ICD-10-CM | POA: Diagnosis not present

## 2020-11-03 LAB — POCT INR: INR: 2.3 (ref 2.0–3.0)

## 2020-11-04 ENCOUNTER — Ambulatory Visit (INDEPENDENT_AMBULATORY_CARE_PROVIDER_SITE_OTHER): Payer: Medicare Other | Admitting: *Deleted

## 2020-11-04 DIAGNOSIS — Z7901 Long term (current) use of anticoagulants: Secondary | ICD-10-CM

## 2020-11-04 DIAGNOSIS — Z5181 Encounter for therapeutic drug level monitoring: Secondary | ICD-10-CM

## 2020-11-04 NOTE — Patient Instructions (Signed)
Description   Called pt and advised patient to continue taking 2mg  daily. Remain consistent on your green leafy vegetables. Recheck INR in 1 week self tester for insurance purposes.

## 2020-11-10 ENCOUNTER — Telehealth: Payer: Self-pay

## 2020-11-10 NOTE — Telephone Encounter (Signed)
Pt is self tester and due for INR check today.  Attempted to contact pt, but no answer and vm box not set up. Unable to leave message.

## 2020-11-11 ENCOUNTER — Ambulatory Visit (INDEPENDENT_AMBULATORY_CARE_PROVIDER_SITE_OTHER): Payer: Medicare Other | Admitting: *Deleted

## 2020-11-11 DIAGNOSIS — Z7901 Long term (current) use of anticoagulants: Secondary | ICD-10-CM | POA: Diagnosis not present

## 2020-11-11 DIAGNOSIS — Z5181 Encounter for therapeutic drug level monitoring: Secondary | ICD-10-CM | POA: Diagnosis not present

## 2020-11-11 LAB — POCT INR: INR: 2.3 (ref 2.0–3.0)

## 2020-11-11 NOTE — Telephone Encounter (Signed)
Received a fax with the pt's INR from yesterday. Spoke with pt and addressed INR and set recheck date.

## 2020-11-11 NOTE — Patient Instructions (Signed)
Description   Called pt and advised patient to continue taking 2mg  daily. Remain consistent on your green leafy vegetables. Recheck INR in 1 week self tester for insurance purposes.

## 2020-11-17 LAB — POCT INR: INR: 3.2 — AB (ref 2.0–3.0)

## 2020-11-18 ENCOUNTER — Ambulatory Visit (INDEPENDENT_AMBULATORY_CARE_PROVIDER_SITE_OTHER): Payer: Medicare Other | Admitting: *Deleted

## 2020-11-18 DIAGNOSIS — Z5181 Encounter for therapeutic drug level monitoring: Secondary | ICD-10-CM | POA: Diagnosis not present

## 2020-11-18 DIAGNOSIS — I4821 Permanent atrial fibrillation: Secondary | ICD-10-CM | POA: Diagnosis not present

## 2020-11-18 DIAGNOSIS — Z7901 Long term (current) use of anticoagulants: Secondary | ICD-10-CM

## 2020-11-24 ENCOUNTER — Ambulatory Visit: Payer: Medicare Other | Admitting: Internal Medicine

## 2020-11-25 ENCOUNTER — Ambulatory Visit (INDEPENDENT_AMBULATORY_CARE_PROVIDER_SITE_OTHER): Payer: Medicare Other | Admitting: Pharmacist

## 2020-11-25 DIAGNOSIS — Z7901 Long term (current) use of anticoagulants: Secondary | ICD-10-CM | POA: Diagnosis not present

## 2020-11-25 DIAGNOSIS — Z5181 Encounter for therapeutic drug level monitoring: Secondary | ICD-10-CM

## 2020-11-25 LAB — POCT INR: INR: 2.7 (ref 2.0–3.0)

## 2020-12-02 ENCOUNTER — Ambulatory Visit (INDEPENDENT_AMBULATORY_CARE_PROVIDER_SITE_OTHER): Payer: Medicare Other | Admitting: *Deleted

## 2020-12-02 DIAGNOSIS — Z5181 Encounter for therapeutic drug level monitoring: Secondary | ICD-10-CM | POA: Diagnosis not present

## 2020-12-02 LAB — POCT INR: INR: 2.4 (ref 2.0–3.0)

## 2020-12-02 NOTE — Progress Notes (Signed)
Cardiology Office Note Date:  12/04/2020  Patient ID:  Martin, [name] 01/02/1951, MRN [id number] PCP:  [name]., MD  Cardiologist/Electrophysiologist: Dr. [name] Nephrology: Dr. Lorrene Reid >> Dr. Moshe Cipro GI: at [facility]      Chief Complaint: routine visit  History of Present Illness: [name] is a 70 y.o. male with history of CAD (CABG 1992), HTN, HLD, AFib. H/o liver and renal transplant, chronic CHF (systolic), ideopathic thrombocyopenia  Was hospitalized 9/19 with ventricular tachycardia.  He was seen in consult by Dr. Greggory Brandy.  He has previously had been 55-60% and beta-blockers were initiated.  With repeat echo demonstrating EF 40% or so it was elected to put him on amiodarone.      He comes today to be seen for Dr. [name], last seen by him May 2021, his note states: "The patient is a DNR.  He has "no quality of life "with his multiple medical problems, while acknowledging that he is depressed, is having been treated for many years, he is ready to die" discussed amiodarone, the patient wanted to continue the drug. Pt was having orthostatic light headedness and falls, his coreg was reduced and his proamatine increased.  Saw PMD service Oct 2021, with some reports of SOB his metolazone was increased to 2.5mg  3x/week, planned for CXR  TODAY All in all feels like he is OK. Thinks he may have passed a kidney stone a few days ago, had some acute flank discomfort that was suddenly gone as well with slight blood tinge in his urine. No ongoing or bleeding otherwise.  He is going to reach out to Dr. Jonnie Finner.  No CP, palpitations No rest SOB, denies symptoms of PND/orthopnea. Some baseline DOE. He will takes infrequently an extra lasix for fluid retention/weight gain He has seen Dr. Moshe Cipro since his PMD increased his metolazone, and has had labs there maybe 6-8 weeks ago.  He will get lightheaded upon standing more so in the evenings after he has been  laying on the sofa a while, not so much during the day. No near syncope or syncope.  He confirms his DNR status, no desire for ICD    Past Medical History:  Diagnosis Date   Atrial fibrillation ([place]) 08/2009   CAD (coronary artery disease)    s/p CABG -- 1992   ESRD (end stage renal disease) ([place])    HLD (hyperlipidemia)    HTN (hypertension)    Idiopathic thrombocytopenia ([facility])     Past Surgical History:  Procedure Laterality Date   APPENDECTOMY     CORONARY ARTERY BYPASS GRAFT     FACIAL COSMETIC SURGERY     GALLBLADDER SURGERY     HERNIA REPAIR     KIDNEY TRANSPLANT     LEFT HEART CATH AND CORONARY ANGIOGRAPHY N/A 08/07/2018   Procedure: LEFT HEART CATH AND CORONARY ANGIOGRAPHY;  Surgeon: Sherren Mocha, MD;  Location: [facility];  Service: Cardiovascular;  Laterality: N/A;   LIVER TRANSPLANT      Current Outpatient Medications  Medication Sig Dispense Refill   acetaminophen (TYLENOL) 500 MG tablet Take 1,000 mg by mouth every 6 (six) hours as needed for mild pain or headache.      albuterol (PROVENTIL) (2.5 MG/3ML) 0.083% nebulizer solution Take 2.5 mg by nebulization every 2 (two) hours as needed for wheezing or shortness of breath.     allopurinol (ZYLOPRIM) 100 MG tablet Take 100 mg by mouth daily.     amiodarone (PACERONE) 200 MG tablet  TAKE 1 TABLET BY MOUTH EVERY DAY 30 tablet 5   atorvastatin (LIPITOR) 20 MG tablet Take 20 mg by mouth at bedtime.      calcitRIOL (ROCALTROL) 0.25 MCG capsule Take 1 capsule by mouth daily.      carvedilol (COREG) 6.25 MG tablet Take 1 tablet by mouth in the morning and 2 tablets by mouth at night. 270 tablet 3   Cholecalciferol (VITAMIN D3) 25 MCG (1000 UT) CAPS Take 1,000 Units by mouth daily.      Cyanocobalamin (VITAMIN B12) 500 MCG TABS Take 500 mcg by mouth daily.      DULoxetine (CYMBALTA) 60 MG capsule Take 60 mg by mouth at bedtime.     EPINEPHrine (EPI-PEN) 0.3 mg/0.3 mL DEVI Inject 0.3 mg  into the muscle once as needed (severe allergic reaction).     ferrous sulfate 325 (65 FE) MG tablet Take 325 mg by mouth daily with breakfast.     fish oil-omega-3 fatty acids 1000 MG capsule Take 2 g by mouth daily.     fluticasone (FLONASE) 50 MCG/ACT nasal spray Place 2 sprays into both nostrils daily as needed for allergies or rhinitis.      furosemide (LASIX) 40 MG tablet Take 40 mg by mouth 2 (two) times daily.     Melatonin 1 MG TABS Take 1 mg by mouth at bedtime.     metolazone (ZAROXOLYN) 5 MG tablet Take 0.5 tablets by mouth once a week.      midodrine (PROAMATINE) 5 MG tablet Take 5 mg by mouth 3 (three) times daily.     nitroGLYCERIN (NITROSTAT) 0.4 MG SL tablet Place 0.4 mg under the tongue every 5 (five) minutes as needed for chest pain.     potassium chloride SA (KLOR-CON) 20 MEQ tablet Take 2 tablets by mouth 2 (two) times daily. Pt is to take 75mEq K on days he takes Metolazone.      predniSONE (DELTASONE) 5 MG tablet Take 1 tablet (5 mg total) by mouth daily.     QUEtiapine (SEROQUEL) 50 MG tablet Take 3 tablets by mouth at bedtime.     tacrolimus (PROGRAF) 0.5 MG capsule Take 0.5 mg by mouth 2 (two) times daily.      traZODone (DESYREL) 150 MG tablet Take 150 mg by mouth at bedtime as needed for sleep.      warfarin (COUMADIN) 4 MG tablet TAKE AS DIRECTED BY COUMADIN CLINIC 65 tablet 1   No current facility-administered medications for this visit.    Allergies:   Other, Grapefruit concentrate, Haloperidol, Nsaids, Rifampin, and Triazolam   Social History:  The patient  reports that he has never smoked. He has never used smokeless tobacco. He reports that he does not drink alcohol and does not use drugs.   Family History:  The patient's family history includes Atrial fibrillation in his mother; Breast cancer in his mother; Bronchiolitis in his paternal grandfather; Cancer in his paternal grandmother; Emphysema in his paternal grandfather; Heart attack in his  maternal grandfather; Heart failure in his paternal grandfather; Hyperlipidemia in his brother; Hypertension in his brother; Other in his brother; Renal Disease in his maternal grandmother; Renal cancer in his father; Stroke in his maternal grandmother.  ROS:  Please see the history of present illness.    All other systems are reviewed and otherwise negative.   PHYSICAL EXAM:  VS:  BP 120/60 (BP Location: Left Arm, Patient Position: Sitting, Cuff Size: Normal)    Pulse 78    Ht  5\' 10"  (1.778 m)    Wt 186 lb (84.4 kg)    SpO2 93%    BMI 26.69 kg/m  BMI: Body mass index is 26.69 kg/m. Well nourished, well developed, in no acute distress HEENT: normocephalic, atraumatic Neck: no JVD, carotid bruits or masses Cardiac:   irreg-irreg; no significant murmurs, no rubs, or gallops Lungs:  CTA b/l, no wheezing, rhonchi or rales Abd: soft, nontender, obese MS: no deformity or atrophy Ext: no edema Skin: warm and dry, no rash Neuro:  No gross deficits appreciated Psych: euthymic mood, full affect    EKG:  Not done today  11/13/2018: TTE Study Conclusions  - Left ventricle: The cavity size was normal. Wall thickness was  increased in a pattern of severe LVH. Systolic function was  moderately reduced. The estimated ejection fraction was in the  range of 35% to 40%. There is akinesis of the basalinferior  myocardium. There is hypokinesis of the mid-apicalinferior  myocardium. There is hypokinesis of the inferolateral myocardium.  The study was not technically sufficient to allow evaluation of  LV diastolic dysfunction due to atrial fibrillation.  - Aortic valve: Trileaflet; moderately thickened, moderately  calcified leaflets. There was trivial regurgitation.  - Mitral valve: Calcified annulus. There was mild regurgitation.  - Left atrium: The atrium was severely dilated.  - Right atrium: The atrium was severely dilated.  - Pulmonic valve: There was trivial regurgitation.  -  Pulmonary arteries: PA peak pressure: 51 mm Hg (S).   Impressions:  - The right ventricular systolic pressure was increased consistent  with moderate pulmonary hypertension.    08/07/2018: LHC  Prox RCA lesion is 100% stenosed.  Acute Mrg lesion is 100% stenosed.  Ost Ramus lesion is 100% stenosed.  Ost Cx to Prox Cx lesion is 100% stenosed.  Ost LM to Mid LM lesion is 50% stenosed.  Origin to Prox Graft lesion is 100% stenosed.  Origin to Prox Graft lesion is 60% stenosed.   1. Severe native 3 vessel CAD with total occlusion of the LAD, LCx, and RCA 2. S/P CABG with continued patency of the LIMA-LAD, SVG-ramus, and SVG-PDA 3. Chronic occlusion of the SVG-OM1 4. Moderate stenosis of the SVG-ramus. Favor medical therapy considering comorbidities, thrombocytopenia, chronic anticoagulation 5. Elevated LVEDP   Recent Labs: 03/17/2020: ALT 18; BUN 25; Creatinine, Ser 2.10; Potassium 4.1; Sodium 141; TSH 2.490  No results found for requested labs within last 8760 hours.   CrCl cannot be calculated (Patient's most recent lab result is older than the maximum 21 days allowed.).   Wt Readings from Last 3 Encounters:  12/04/20 186 lb (84.4 kg)  03/17/20 191 lb (86.6 kg)  08/22/19 200 lb 9.6 oz (91 kg)     Other studies reviewed: Additional studies/records reviewed today include: summarized above  ASSESSMENT AND PLAN:  1. Permanent AFib     CHA2DS2Vasc is 4, on warfarin, monitored and managed here     Rate controlled  2. CAD     No anginal symptoms     On BB, no ASA with warfarin     Not on a statin, suspect 2/2 hx of liver transplant  3. H/o VT     Maintained in amiodarone     Labs today  4. ICM     No exam findings of volume OL     Weight is stable     H/x of kidney transplant, follows closely with nephrology    Disposition: F/u 25mo, sooner if needed.  Current  medicines are reviewed at length with the patient today.  The patient did not have any concerns  regarding medicines.  Venetia Night, PA-C 12/04/2020 4:47 PM     [address] [place] [address] [phone] (office)  [phone] (fax)

## 2020-12-04 ENCOUNTER — Encounter: Payer: Self-pay | Admitting: Physician Assistant

## 2020-12-04 ENCOUNTER — Other Ambulatory Visit: Payer: Self-pay

## 2020-12-04 ENCOUNTER — Ambulatory Visit (INDEPENDENT_AMBULATORY_CARE_PROVIDER_SITE_OTHER): Payer: Medicare Other | Admitting: Physician Assistant

## 2020-12-04 VITALS — BP 120/60 | HR 78 | Ht 70.0 in | Wt 186.0 lb

## 2020-12-04 DIAGNOSIS — I472 Ventricular tachycardia, unspecified: Secondary | ICD-10-CM

## 2020-12-04 DIAGNOSIS — Z79899 Other long term (current) drug therapy: Secondary | ICD-10-CM

## 2020-12-04 DIAGNOSIS — I255 Ischemic cardiomyopathy: Secondary | ICD-10-CM

## 2020-12-04 DIAGNOSIS — I4821 Permanent atrial fibrillation: Secondary | ICD-10-CM

## 2020-12-04 DIAGNOSIS — I25810 Atherosclerosis of coronary artery bypass graft(s) without angina pectoris: Secondary | ICD-10-CM

## 2020-12-04 NOTE — Patient Instructions (Signed)
Medication Instructions:   Your physician recommends that you continue on your current medications as directed. Please refer to the Current Medication list given to you today.  *If you need a refill on your cardiac medications before your next appointment, please call your pharmacy*   Lab Work: BMET LFT AND TSH    If you have labs (blood work) drawn today and your tests are completely normal, you will receive your results only by: [name] MyChart Message (if you have MyChart) OR . A paper copy in the mail If you have any lab test that is abnormal or we need to change your treatment, we will call you to review the results.   Testing/Procedures: NONE ORDERED  TODAY     Follow-Up: At [facility], you and your health needs are our priority.  As part of our continuing mission to provide you with exceptional heart care, we have created designated Provider Care Teams.  These Care Teams include your primary Cardiologist (physician) and Advanced Practice Providers (APPs -  Physician Assistants and Nurse Practitioners) who all work together to provide you with the care you need, when you need it.  We recommend signing up for the patient portal called "MyChart".  Sign up information is provided on this After Visit Summary.  MyChart is used to connect with patients for Virtual Visits (Telemedicine).  Patients are able to view lab/test results, encounter notes, upcoming appointments, etc.  Non-urgent messages can be sent to your provider as well.   To learn more about what you can do with MyChart, go to [email or web address].    Your next appointment:   6 month(s)  The format for your next appointment:   In Person  Provider:   You may see Dr. [name]  or one of the following Advanced Practice Providers on your designated Care Team:    Chanetta Marshall, NP  Tommye Standard, PA-C  Legrand Como "[place]da [place], [place]    Other Instructions

## 2020-12-05 LAB — HEPATIC FUNCTION PANEL
ALT: 17 IU/L (ref 0–44)
AST: 17 IU/L (ref 0–40)
Albumin: 3.7 g/dL — ABNORMAL LOW (ref 3.8–4.8)
Alkaline Phosphatase: 96 IU/L (ref 44–121)
Bilirubin Total: 2.6 mg/dL — ABNORMAL HIGH (ref 0.0–1.2)
Bilirubin, Direct: 0.42 mg/dL — ABNORMAL HIGH (ref 0.00–0.40)
Total Protein: 6.3 g/dL (ref 6.0–8.5)

## 2020-12-05 LAB — TSH: TSH: 1.53 u[IU]/mL (ref 0.450–4.500)

## 2020-12-09 LAB — POCT INR: INR: 2 (ref 2.0–3.0)

## 2020-12-10 ENCOUNTER — Ambulatory Visit (INDEPENDENT_AMBULATORY_CARE_PROVIDER_SITE_OTHER): Payer: Medicare Other | Admitting: *Deleted

## 2020-12-10 DIAGNOSIS — I4891 Unspecified atrial fibrillation: Secondary | ICD-10-CM

## 2020-12-10 DIAGNOSIS — Z7901 Long term (current) use of anticoagulants: Secondary | ICD-10-CM

## 2020-12-10 DIAGNOSIS — Z5181 Encounter for therapeutic drug level monitoring: Secondary | ICD-10-CM | POA: Diagnosis not present

## 2020-12-10 NOTE — Patient Instructions (Signed)
Description   Called pt and advised patient to continue taking 2mg  daily. Remain consistent on your green leafy vegetables. Recheck INR in 1 week self tester for insurance purposes.

## 2020-12-16 ENCOUNTER — Ambulatory Visit (INDEPENDENT_AMBULATORY_CARE_PROVIDER_SITE_OTHER): Payer: Medicare Other | Admitting: Pharmacist

## 2020-12-16 DIAGNOSIS — Z5181 Encounter for therapeutic drug level monitoring: Secondary | ICD-10-CM | POA: Diagnosis not present

## 2020-12-16 DIAGNOSIS — Z7901 Long term (current) use of anticoagulants: Secondary | ICD-10-CM | POA: Diagnosis not present

## 2020-12-16 LAB — POCT INR: INR: 1.6 — AB (ref 2.0–3.0)

## 2020-12-22 LAB — POCT INR: INR: 2 (ref 2.0–3.0)

## 2020-12-23 ENCOUNTER — Ambulatory Visit (INDEPENDENT_AMBULATORY_CARE_PROVIDER_SITE_OTHER): Payer: Medicare Other | Admitting: *Deleted

## 2020-12-23 DIAGNOSIS — Z7901 Long term (current) use of anticoagulants: Secondary | ICD-10-CM

## 2020-12-23 DIAGNOSIS — Z5181 Encounter for therapeutic drug level monitoring: Secondary | ICD-10-CM

## 2020-12-24 NOTE — Patient Instructions (Signed)
Description   Called pt and advised patient to continue taking 2mg  daily. Remain consistent on your green leafy vegetables. Recheck INR in 1 week self tester for insurance purposes.

## 2020-12-30 ENCOUNTER — Ambulatory Visit (INDEPENDENT_AMBULATORY_CARE_PROVIDER_SITE_OTHER): Payer: Medicare Other | Admitting: *Deleted

## 2020-12-30 DIAGNOSIS — Z5181 Encounter for therapeutic drug level monitoring: Secondary | ICD-10-CM

## 2020-12-30 DIAGNOSIS — Z7901 Long term (current) use of anticoagulants: Secondary | ICD-10-CM

## 2020-12-30 LAB — POCT INR: INR: 2 (ref 2.0–3.0)

## 2020-12-30 NOTE — Patient Instructions (Signed)
Description   Spoke with pt and advised patient to continue taking 2mg  daily. Remain consistent on your green leafy vegetables. Recheck INR in 1 week self tester for insurance purposes.

## 2021-01-05 LAB — POCT INR: INR: 2.4 (ref 2.0–3.0)

## 2021-01-06 ENCOUNTER — Ambulatory Visit (INDEPENDENT_AMBULATORY_CARE_PROVIDER_SITE_OTHER): Payer: Medicare Other | Admitting: *Deleted

## 2021-01-06 DIAGNOSIS — Z5181 Encounter for therapeutic drug level monitoring: Secondary | ICD-10-CM

## 2021-01-06 DIAGNOSIS — Z7901 Long term (current) use of anticoagulants: Secondary | ICD-10-CM

## 2021-01-12 ENCOUNTER — Ambulatory Visit (INDEPENDENT_AMBULATORY_CARE_PROVIDER_SITE_OTHER): Payer: Medicare Other | Admitting: *Deleted

## 2021-01-12 DIAGNOSIS — Z5181 Encounter for therapeutic drug level monitoring: Secondary | ICD-10-CM

## 2021-01-12 DIAGNOSIS — Z7901 Long term (current) use of anticoagulants: Secondary | ICD-10-CM

## 2021-01-12 LAB — POCT INR: INR: 2.1 (ref 2.0–3.0)

## 2021-01-19 LAB — POCT INR: INR: 1.6 — AB (ref 2.0–3.0)

## 2021-01-20 ENCOUNTER — Ambulatory Visit (INDEPENDENT_AMBULATORY_CARE_PROVIDER_SITE_OTHER): Payer: Medicare Other | Admitting: Pharmacist

## 2021-01-20 DIAGNOSIS — Z5181 Encounter for therapeutic drug level monitoring: Secondary | ICD-10-CM

## 2021-01-20 DIAGNOSIS — Z7901 Long term (current) use of anticoagulants: Secondary | ICD-10-CM

## 2021-01-20 NOTE — Patient Instructions (Signed)
Description   Spoke with pt and advised patient to take 4mg  today then continue taking 2mg  daily. Remain consistent on your green leafy vegetables. Recheck INR in 1 week self tester for insurance purposes.

## 2021-01-26 LAB — POCT INR: INR: 2 (ref 2.0–3.0)

## 2021-01-27 ENCOUNTER — Ambulatory Visit (INDEPENDENT_AMBULATORY_CARE_PROVIDER_SITE_OTHER): Payer: Medicare Other | Admitting: *Deleted

## 2021-01-27 DIAGNOSIS — Z5181 Encounter for therapeutic drug level monitoring: Secondary | ICD-10-CM

## 2021-01-27 DIAGNOSIS — Z7901 Long term (current) use of anticoagulants: Secondary | ICD-10-CM

## 2021-01-27 NOTE — Patient Instructions (Signed)
Description   Spoke with pt and advised patient to continue taking 2mg  daily. Remain consistent on your green leafy vegetables. Recheck INR in 1 week self tester for insurance purposes.

## 2021-02-03 ENCOUNTER — Ambulatory Visit (INDEPENDENT_AMBULATORY_CARE_PROVIDER_SITE_OTHER): Payer: Medicare Other | Admitting: Pharmacist

## 2021-02-03 DIAGNOSIS — Z5181 Encounter for therapeutic drug level monitoring: Secondary | ICD-10-CM | POA: Diagnosis not present

## 2021-02-03 DIAGNOSIS — Z7901 Long term (current) use of anticoagulants: Secondary | ICD-10-CM | POA: Diagnosis not present

## 2021-02-03 LAB — POCT INR: INR: 1.9 — AB (ref 2.0–3.0)

## 2021-02-13 ENCOUNTER — Telehealth: Payer: Self-pay | Admitting: *Deleted

## 2021-02-13 NOTE — Telephone Encounter (Signed)
Pt checks his INR at home with a meter. Pt due to have INR checked on 4/5, however, pt's meter has not been working. Pt stated that he has contacted the company that he gets the meter from to let them know his meter is not working, pt stated he does not know when the company should be out to help with his meter but will give them another call today to find out because he has not talked to them since the first of the week.

## 2021-02-17 ENCOUNTER — Ambulatory Visit (INDEPENDENT_AMBULATORY_CARE_PROVIDER_SITE_OTHER): Payer: Medicare Other | Admitting: Pharmacist

## 2021-02-17 DIAGNOSIS — Z5181 Encounter for therapeutic drug level monitoring: Secondary | ICD-10-CM

## 2021-02-17 DIAGNOSIS — Z7901 Long term (current) use of anticoagulants: Secondary | ICD-10-CM

## 2021-02-17 LAB — POCT INR
INR: 2.2 (ref 2.0–3.0)
INR: 2.2 (ref 2.0–3.0)

## 2021-02-17 NOTE — Patient Instructions (Signed)
Description   Spoke with pt and advised patient to continue taking 2mg  daily except 4mg  on Tuesdays. Remain consistent on your green leafy vegetables. Recheck INR in 1 week, self tester for insurance purposes.

## 2021-02-18 DIAGNOSIS — Z7901 Long term (current) use of anticoagulants: Secondary | ICD-10-CM

## 2021-02-18 DIAGNOSIS — Z5181 Encounter for therapeutic drug level monitoring: Secondary | ICD-10-CM

## 2021-02-18 NOTE — Progress Notes (Signed)
This encounter was created in error - please disregard.

## 2021-02-25 ENCOUNTER — Ambulatory Visit (INDEPENDENT_AMBULATORY_CARE_PROVIDER_SITE_OTHER): Payer: Medicare Other | Admitting: *Deleted

## 2021-02-25 DIAGNOSIS — Z7901 Long term (current) use of anticoagulants: Secondary | ICD-10-CM

## 2021-02-25 DIAGNOSIS — Z5181 Encounter for therapeutic drug level monitoring: Secondary | ICD-10-CM

## 2021-02-25 LAB — POCT INR: INR: 2.3 (ref 2.0–3.0)

## 2021-02-25 NOTE — Patient Instructions (Signed)
Description   Spoke with pt and advised patient to continue taking 2mg  daily except 4mg  on Tuesdays. Remain consistent on your green leafy vegetables. Recheck INR in 1 week, self tester for insurance purposes.

## 2021-03-05 ENCOUNTER — Telehealth: Payer: Self-pay | Admitting: *Deleted

## 2021-03-05 ENCOUNTER — Ambulatory Visit (INDEPENDENT_AMBULATORY_CARE_PROVIDER_SITE_OTHER): Payer: Medicare Other | Admitting: *Deleted

## 2021-03-05 DIAGNOSIS — Z5181 Encounter for therapeutic drug level monitoring: Secondary | ICD-10-CM | POA: Diagnosis not present

## 2021-03-05 DIAGNOSIS — Z7901 Long term (current) use of anticoagulants: Secondary | ICD-10-CM

## 2021-03-05 LAB — POCT INR: INR: 2.2 (ref 2.0–3.0)

## 2021-03-05 NOTE — Patient Instructions (Signed)
Description   Spoke with pt and advised patient to continue taking 2mg  daily except 4mg  on Tuesdays. Remain consistent on your green leafy vegetables. Recheck INR in 1 week, self tester for insurance purposes.

## 2021-03-05 NOTE — Telephone Encounter (Signed)
Called pt since INR was due yesterday; he states he is having meter issues again. Advised he needs to reach out to the company so we can stay on track with INR results & dosing. He stated he would reach out to them today. Will try back another day for an update.

## 2021-03-11 ENCOUNTER — Other Ambulatory Visit: Payer: Self-pay | Admitting: Internal Medicine

## 2021-03-12 ENCOUNTER — Ambulatory Visit (INDEPENDENT_AMBULATORY_CARE_PROVIDER_SITE_OTHER): Payer: Medicare Other | Admitting: Internal Medicine

## 2021-03-12 DIAGNOSIS — Z5181 Encounter for therapeutic drug level monitoring: Secondary | ICD-10-CM | POA: Diagnosis not present

## 2021-03-12 LAB — POCT INR: INR: 2.1 (ref 2.0–3.0)

## 2021-03-12 NOTE — Patient Instructions (Signed)
Description   Spoke with pt and advised patient to continue taking 2mg  daily except 4mg  on Tuesdays. Remain consistent on your green leafy vegetables. Recheck INR in 1 week, self tester for insurance purposes.

## 2021-03-23 ENCOUNTER — Telehealth: Payer: Self-pay | Admitting: *Deleted

## 2021-03-23 NOTE — Telephone Encounter (Signed)
Called pt since he is overdue to obtain his INR, called pt and had to leave a message to call back.

## 2021-03-24 ENCOUNTER — Telehealth: Payer: Self-pay | Admitting: *Deleted

## 2021-03-24 NOTE — Telephone Encounter (Signed)
Called pt since he is overdue for an INR check and he has not returned any messages. Pt answered and I inquired about testing his INR and he stated he is having problems again with his meter. He stated he spoke with the company of Saturday and they were going to send new strips, however, he stated already has new strips. Asked if he inquired about a new machine and he stated he didn't. Advised he should since we need to check his INR and if he is not able to get a remedy to this we need to make an appt in the office. Advised we could get him seen in our [facility] and he verbalized understanding. He states he will call them tonight and I will follow up tomorrow.

## 2021-03-25 NOTE — Telephone Encounter (Signed)
Returned a call to the pt. He states the machine may be replaced but unsure when or the process. He will come into the [place] office for an appt.

## 2021-03-25 NOTE — Telephone Encounter (Signed)
Patient calling back.   °

## 2021-03-27 ENCOUNTER — Inpatient Hospital Stay: Payer: Medicare Other | Admitting: Oncology

## 2021-03-27 ENCOUNTER — Inpatient Hospital Stay: Payer: Medicare Other

## 2021-03-31 ENCOUNTER — Other Ambulatory Visit: Payer: Self-pay

## 2021-03-31 ENCOUNTER — Ambulatory Visit (INDEPENDENT_AMBULATORY_CARE_PROVIDER_SITE_OTHER): Payer: Medicare Other

## 2021-03-31 DIAGNOSIS — Z7901 Long term (current) use of anticoagulants: Secondary | ICD-10-CM

## 2021-03-31 DIAGNOSIS — Z5181 Encounter for therapeutic drug level monitoring: Secondary | ICD-10-CM | POA: Diagnosis not present

## 2021-03-31 LAB — POCT INR: INR: 2.4 (ref 2.0–3.0)

## 2021-03-31 NOTE — Patient Instructions (Signed)
Spoke with pt and advised patient to continue taking 2mg  daily except 4mg  on Tuesdays. Remain consistent on your green leafy vegetables. Recheck INR in 1 week, self tester for insurance purposes. [phone]

## 2021-04-07 ENCOUNTER — Telehealth: Payer: Self-pay

## 2021-04-07 NOTE — Telephone Encounter (Signed)
Pt is self tester and due for INR check today. Called pt and he reports that his machine is still broken and he has not received a replacement yet. Offered pt appt, but he prefers to go to [place] office. Advised pt that Legrand Como is in [place] on Tuesdays and he will have to wait another week, so offered him appt in [facility] office. Pt asks for the opportunity to call and check on status of his replacement machine prior to making appt. Advised him that I will make a note and the he will probably receive a call tomorrow if we have not heard from him by the end of the day.  He is appreciative of the call.

## 2021-04-08 NOTE — Telephone Encounter (Signed)
Received INR of 1.8 (04/07/2021) from [facility] [place]. Called pt, unable to [facility] because mailbox is full.

## 2021-04-10 NOTE — Telephone Encounter (Signed)
Attempted to call patient.  Call goes to VM, can not leave message, mailbox full.  Attempted to call number on [facility] faxed page and number not in service

## 2021-04-10 NOTE — Telephone Encounter (Signed)
Called patient's brother.  LMOM asking him to have brother call Coumadin clinic.

## 2021-04-15 ENCOUNTER — Ambulatory Visit (INDEPENDENT_AMBULATORY_CARE_PROVIDER_SITE_OTHER): Payer: Medicare Other | Admitting: *Deleted

## 2021-04-15 DIAGNOSIS — Z5181 Encounter for therapeutic drug level monitoring: Secondary | ICD-10-CM

## 2021-04-15 DIAGNOSIS — Z7901 Long term (current) use of anticoagulants: Secondary | ICD-10-CM

## 2021-04-15 LAB — POCT INR: INR: 2.3 (ref 2.0–3.0)

## 2021-04-16 ENCOUNTER — Encounter: Payer: Self-pay | Admitting: *Deleted

## 2021-04-16 NOTE — Telephone Encounter (Signed)
This encounter was created in error - please disregard.

## 2021-04-16 NOTE — Patient Instructions (Signed)
Description   Spoke with pt and advised patient to continue taking 2mg  daily except 4mg  on Tuesdays. Remain consistent on your green leafy vegetables. Recheck INR in 1 week, self tester for insurance purposes. [phone]

## 2021-04-23 LAB — POCT INR: INR: 1.6 — AB (ref 2.0–3.0)

## 2021-04-24 ENCOUNTER — Telehealth: Payer: Self-pay | Admitting: Pharmacist

## 2021-04-24 ENCOUNTER — Ambulatory Visit (INDEPENDENT_AMBULATORY_CARE_PROVIDER_SITE_OTHER): Payer: Medicare Other | Admitting: Pharmacist

## 2021-04-24 DIAGNOSIS — Z5181 Encounter for therapeutic drug level monitoring: Secondary | ICD-10-CM

## 2021-04-24 DIAGNOSIS — Z7901 Long term (current) use of anticoagulants: Secondary | ICD-10-CM

## 2021-04-24 NOTE — Telephone Encounter (Signed)
Error

## 2021-04-27 ENCOUNTER — Other Ambulatory Visit: Payer: Self-pay | Admitting: Internal Medicine

## 2021-04-29 ENCOUNTER — Ambulatory Visit (INDEPENDENT_AMBULATORY_CARE_PROVIDER_SITE_OTHER): Payer: Medicare Other | Admitting: Pharmacist

## 2021-04-29 DIAGNOSIS — Z7901 Long term (current) use of anticoagulants: Secondary | ICD-10-CM | POA: Diagnosis not present

## 2021-04-29 DIAGNOSIS — I4821 Permanent atrial fibrillation: Secondary | ICD-10-CM

## 2021-04-29 DIAGNOSIS — Z5181 Encounter for therapeutic drug level monitoring: Secondary | ICD-10-CM

## 2021-04-29 LAB — POCT INR: INR: 1.5 — AB (ref 2.0–3.0)

## 2021-04-29 NOTE — Patient Instructions (Signed)
Description   Spoke with pt and advised to take 4 mg tonight and tomorrow then continue 2mg  daily except 4mg  on Tuesdays. Remain consistent on your green leafy vegetables. Recheck INR in 1 week, self tester for insurance purposes. [phone]

## 2021-05-08 ENCOUNTER — Ambulatory Visit (INDEPENDENT_AMBULATORY_CARE_PROVIDER_SITE_OTHER): Payer: Medicare Other

## 2021-05-08 DIAGNOSIS — Z5181 Encounter for therapeutic drug level monitoring: Secondary | ICD-10-CM | POA: Diagnosis not present

## 2021-05-08 DIAGNOSIS — Z7901 Long term (current) use of anticoagulants: Secondary | ICD-10-CM

## 2021-05-08 LAB — POCT INR: INR: 1.9 — AB (ref 2.0–3.0)

## 2021-05-14 ENCOUNTER — Telehealth: Payer: Self-pay | Admitting: Internal Medicine

## 2021-05-14 ENCOUNTER — Ambulatory Visit (INDEPENDENT_AMBULATORY_CARE_PROVIDER_SITE_OTHER): Payer: Medicare Other | Admitting: Internal Medicine

## 2021-05-14 DIAGNOSIS — Z5181 Encounter for therapeutic drug level monitoring: Secondary | ICD-10-CM | POA: Diagnosis not present

## 2021-05-14 LAB — POCT INR: INR: 2.6 (ref 2.0–3.0)

## 2021-05-14 NOTE — Telephone Encounter (Addendum)
Attempted to call pt back, went strait to voicemail. LMOM.  Coumadin Clinic has attempted to get in touch with pt multiple times because we have received INR results. Pt has 2 open anti-coag encounter open. Please see anti-coag encounters for further documentation.

## 2021-05-14 NOTE — Telephone Encounter (Signed)
New message:     Patient calling to report his coumadin.

## 2021-05-14 NOTE — Patient Instructions (Addendum)
Description   Spoke with pt and advised to continue 2mg  daily except 4mg  on Tuesdays. Remain consistent with your green leafy vegetables. Recheck INR in 1 week, self tester for insurance purposes. [phone]

## 2021-05-22 ENCOUNTER — Ambulatory Visit (INDEPENDENT_AMBULATORY_CARE_PROVIDER_SITE_OTHER): Payer: Medicare Other

## 2021-05-22 DIAGNOSIS — Z5181 Encounter for therapeutic drug level monitoring: Secondary | ICD-10-CM | POA: Diagnosis not present

## 2021-05-22 DIAGNOSIS — Z7901 Long term (current) use of anticoagulants: Secondary | ICD-10-CM

## 2021-05-22 LAB — POCT INR: INR: 2.1 (ref 2.0–3.0)

## 2021-05-22 NOTE — Patient Instructions (Signed)
Left message for pt and advised to continue 2mg  daily except 4mg  on Tuesdays. Remain consistent with your green leafy vegetables. Recheck INR in 1 week, self tester for insurance purposes. [phone]

## 2021-05-25 ENCOUNTER — Telehealth: Payer: Self-pay | Admitting: *Deleted

## 2021-05-25 NOTE — Telephone Encounter (Signed)
Pt self test his INR. Received fax this morning stating pt's INR was 1.6 on 7/15. There is also documentation showing his INR was 2.1 on 7/15. Attempted to call pt, no answer. Need to clarify what pt's INR is.

## 2021-05-26 NOTE — Telephone Encounter (Signed)
Pt called back and left message on coumadin clinic's machine to call him back. Called pt back but was not able to get in touch with him- LMOM.

## 2021-05-26 NOTE — Telephone Encounter (Signed)
Called [facility] letting them know the pt did not know where the INR of 1.6 from 05/22/2021 came from. [facility] stated they have record of receiving a call in from pt's phone number reporting the INR of 1.6 on 7/15 and 2.1 on 7/15.  Pt will recheck his INR on 7/22

## 2021-05-26 NOTE — Telephone Encounter (Signed)
Pt called back. Pt stated that his INR was 2.1 on Friday 7/15 and he does not know where the INR of 1.6 came from. Pt stated he will recheck his INR on 7/22 as instructed from the 7/15 anti coag encounter.

## 2021-05-29 ENCOUNTER — Ambulatory Visit (INDEPENDENT_AMBULATORY_CARE_PROVIDER_SITE_OTHER): Payer: Medicare Other | Admitting: *Deleted

## 2021-05-29 DIAGNOSIS — Z5181 Encounter for therapeutic drug level monitoring: Secondary | ICD-10-CM | POA: Diagnosis not present

## 2021-05-29 DIAGNOSIS — Z7901 Long term (current) use of anticoagulants: Secondary | ICD-10-CM

## 2021-05-29 LAB — POCT INR: INR: 1.9 — AB (ref 2.0–3.0)

## 2021-05-29 NOTE — Patient Instructions (Signed)
Description   Spoke with pt and instructed pt to take 4mg  today then continue taking Warfarin 2mg  daily except 4mg  on Tuesdays. Remain consistent with your green leafy vegetables. Recheck INR in 1 week, self tester for insurance purposes. [phone]

## 2021-06-05 ENCOUNTER — Ambulatory Visit (INDEPENDENT_AMBULATORY_CARE_PROVIDER_SITE_OTHER): Payer: Medicare Other

## 2021-06-05 DIAGNOSIS — Z7901 Long term (current) use of anticoagulants: Secondary | ICD-10-CM

## 2021-06-05 DIAGNOSIS — Z5181 Encounter for therapeutic drug level monitoring: Secondary | ICD-10-CM | POA: Diagnosis not present

## 2021-06-05 DIAGNOSIS — I4821 Permanent atrial fibrillation: Secondary | ICD-10-CM

## 2021-06-05 LAB — POCT INR: INR: 2.7 (ref 2.0–3.0)

## 2021-06-05 NOTE — Patient Instructions (Signed)
Description   Left pt a message to call back. Instructed on voicemail to continue taking Warfarin 2mg  daily except 4mg  on Tuesdays. Remain consistent with your green leafy vegetables. Recheck INR in 1 week, self tester for insurance purposes. [phone]

## 2021-06-12 ENCOUNTER — Ambulatory Visit (INDEPENDENT_AMBULATORY_CARE_PROVIDER_SITE_OTHER): Payer: Medicare Other

## 2021-06-12 DIAGNOSIS — Z7901 Long term (current) use of anticoagulants: Secondary | ICD-10-CM

## 2021-06-12 DIAGNOSIS — I4821 Permanent atrial fibrillation: Secondary | ICD-10-CM

## 2021-06-12 DIAGNOSIS — Z5181 Encounter for therapeutic drug level monitoring: Secondary | ICD-10-CM

## 2021-06-12 LAB — POCT INR: INR: 2.3 (ref 2.0–3.0)

## 2021-06-12 NOTE — Patient Instructions (Signed)
Description   Left pt a message to call back. Instructed on voicemail to continue taking Warfarin 2mg  daily except 4mg  on Tuesdays. Remain consistent with your green leafy vegetables. Recheck INR in 1 week, self tester for insurance purposes. [phone]

## 2021-06-20 LAB — POCT INR: INR: 3.4 — AB (ref 2.0–3.0)

## 2021-06-22 ENCOUNTER — Ambulatory Visit (INDEPENDENT_AMBULATORY_CARE_PROVIDER_SITE_OTHER): Payer: Medicare Other

## 2021-06-22 DIAGNOSIS — I4821 Permanent atrial fibrillation: Secondary | ICD-10-CM

## 2021-06-22 DIAGNOSIS — Z5181 Encounter for therapeutic drug level monitoring: Secondary | ICD-10-CM

## 2021-06-22 NOTE — Patient Instructions (Addendum)
Description   Spoke with pt and instructed to hold today's dose and then continue taking Warfarin 2mg  daily except 4mg  on Tuesdays. Remain consistent with your green leafy vegetables. Recheck INR in 1 week, self tester for insurance purposes. [phone]

## 2021-06-23 ENCOUNTER — Encounter: Payer: Self-pay | Admitting: Internal Medicine

## 2021-06-23 ENCOUNTER — Emergency Department (HOSPITAL_COMMUNITY): Payer: Medicare Other

## 2021-06-23 ENCOUNTER — Ambulatory Visit (INDEPENDENT_AMBULATORY_CARE_PROVIDER_SITE_OTHER): Payer: Medicare Other | Admitting: Internal Medicine

## 2021-06-23 ENCOUNTER — Inpatient Hospital Stay (HOSPITAL_COMMUNITY)
Admission: EM | Admit: 2021-06-23 | Discharge: 2021-06-27 | DRG: 286 | Disposition: A | Discharge status: Home Health | Payer: Medicare Other | Attending: Cardiology | Admitting: Cardiology

## 2021-06-23 ENCOUNTER — Other Ambulatory Visit: Payer: Self-pay

## 2021-06-23 VITALS — HR 85 | Ht 70.0 in | Wt 199.2 lb

## 2021-06-23 DIAGNOSIS — I5043 Acute on chronic combined systolic (congestive) and diastolic (congestive) heart failure: Secondary | ICD-10-CM | POA: Diagnosis present

## 2021-06-23 DIAGNOSIS — I4821 Permanent atrial fibrillation: Secondary | ICD-10-CM | POA: Diagnosis present

## 2021-06-23 DIAGNOSIS — I255 Ischemic cardiomyopathy: Secondary | ICD-10-CM | POA: Diagnosis present

## 2021-06-23 DIAGNOSIS — Z91038 Other insect allergy status: Secondary | ICD-10-CM

## 2021-06-23 DIAGNOSIS — I272 Pulmonary hypertension, unspecified: Secondary | ICD-10-CM | POA: Diagnosis present

## 2021-06-23 DIAGNOSIS — I5023 Acute on chronic systolic (congestive) heart failure: Secondary | ICD-10-CM | POA: Diagnosis present

## 2021-06-23 DIAGNOSIS — N183 Chronic kidney disease, stage 3 unspecified: Secondary | ICD-10-CM | POA: Diagnosis present

## 2021-06-23 DIAGNOSIS — Z7901 Long term (current) use of anticoagulants: Secondary | ICD-10-CM | POA: Diagnosis not present

## 2021-06-23 DIAGNOSIS — Z7189 Other specified counseling: Secondary | ICD-10-CM | POA: Diagnosis not present

## 2021-06-23 DIAGNOSIS — I472 Ventricular tachycardia, unspecified: Secondary | ICD-10-CM

## 2021-06-23 DIAGNOSIS — I25810 Atherosclerosis of coronary artery bypass graft(s) without angina pectoris: Secondary | ICD-10-CM | POA: Diagnosis present

## 2021-06-23 DIAGNOSIS — M109 Gout, unspecified: Secondary | ICD-10-CM | POA: Diagnosis present

## 2021-06-23 DIAGNOSIS — Z20822 Contact with and (suspected) exposure to covid-19: Secondary | ICD-10-CM | POA: Diagnosis present

## 2021-06-23 DIAGNOSIS — F32A Depression, unspecified: Secondary | ICD-10-CM | POA: Diagnosis present

## 2021-06-23 DIAGNOSIS — J9601 Acute respiratory failure with hypoxia: Secondary | ICD-10-CM | POA: Diagnosis present

## 2021-06-23 DIAGNOSIS — Z23 Encounter for immunization: Secondary | ICD-10-CM | POA: Diagnosis present

## 2021-06-23 DIAGNOSIS — I509 Heart failure, unspecified: Secondary | ICD-10-CM

## 2021-06-23 DIAGNOSIS — N179 Acute kidney failure, unspecified: Secondary | ICD-10-CM | POA: Diagnosis present

## 2021-06-23 DIAGNOSIS — I130 Hypertensive heart and chronic kidney disease with heart failure and stage 1 through stage 4 chronic kidney disease, or unspecified chronic kidney disease: Secondary | ICD-10-CM | POA: Diagnosis present

## 2021-06-23 DIAGNOSIS — I252 Old myocardial infarction: Secondary | ICD-10-CM

## 2021-06-23 DIAGNOSIS — D849 Immunodeficiency, unspecified: Secondary | ICD-10-CM | POA: Diagnosis present

## 2021-06-23 DIAGNOSIS — Z888 Allergy status to other drugs, medicaments and biological substances status: Secondary | ICD-10-CM

## 2021-06-23 DIAGNOSIS — D693 Immune thrombocytopenic purpura: Secondary | ICD-10-CM | POA: Diagnosis present

## 2021-06-23 DIAGNOSIS — N1832 Chronic kidney disease, stage 3b: Secondary | ICD-10-CM | POA: Diagnosis present

## 2021-06-23 DIAGNOSIS — Z8051 Family history of malignant neoplasm of kidney: Secondary | ICD-10-CM

## 2021-06-23 DIAGNOSIS — I959 Hypotension, unspecified: Secondary | ICD-10-CM | POA: Diagnosis present

## 2021-06-23 DIAGNOSIS — K029 Dental caries, unspecified: Secondary | ICD-10-CM | POA: Diagnosis present

## 2021-06-23 DIAGNOSIS — I4891 Unspecified atrial fibrillation: Secondary | ICD-10-CM | POA: Diagnosis present

## 2021-06-23 DIAGNOSIS — I248 Other forms of acute ischemic heart disease: Secondary | ICD-10-CM | POA: Diagnosis present

## 2021-06-23 DIAGNOSIS — Z940 Kidney transplant status: Secondary | ICD-10-CM

## 2021-06-23 DIAGNOSIS — E876 Hypokalemia: Secondary | ICD-10-CM | POA: Diagnosis not present

## 2021-06-23 DIAGNOSIS — Z886 Allergy status to analgesic agent status: Secondary | ICD-10-CM

## 2021-06-23 DIAGNOSIS — E785 Hyperlipidemia, unspecified: Secondary | ICD-10-CM | POA: Diagnosis present

## 2021-06-23 DIAGNOSIS — I10 Essential (primary) hypertension: Secondary | ICD-10-CM | POA: Diagnosis present

## 2021-06-23 DIAGNOSIS — Z944 Liver transplant status: Secondary | ICD-10-CM | POA: Diagnosis not present

## 2021-06-23 DIAGNOSIS — Z91018 Allergy to other foods: Secondary | ICD-10-CM

## 2021-06-23 DIAGNOSIS — Z83438 Family history of other disorder of lipoprotein metabolism and other lipidemia: Secondary | ICD-10-CM

## 2021-06-23 DIAGNOSIS — Z66 Do not resuscitate: Secondary | ICD-10-CM | POA: Diagnosis present

## 2021-06-23 DIAGNOSIS — Z79899 Other long term (current) drug therapy: Secondary | ICD-10-CM

## 2021-06-23 DIAGNOSIS — I2510 Atherosclerotic heart disease of native coronary artery without angina pectoris: Secondary | ICD-10-CM | POA: Diagnosis present

## 2021-06-23 DIAGNOSIS — Z8249 Family history of ischemic heart disease and other diseases of the circulatory system: Secondary | ICD-10-CM

## 2021-06-23 DIAGNOSIS — Z9049 Acquired absence of other specified parts of digestive tract: Secondary | ICD-10-CM

## 2021-06-23 LAB — HEPATIC FUNCTION PANEL
ALT: 16 U/L (ref 0–44)
AST: 17 U/L (ref 15–41)
Albumin: 3.5 g/dL (ref 3.5–5.0)
Alkaline Phosphatase: 62 U/L (ref 38–126)
Bilirubin, Direct: 0.3 mg/dL — ABNORMAL HIGH (ref 0.0–0.2)
Indirect Bilirubin: 1.7 mg/dL — ABNORMAL HIGH (ref 0.3–0.9)
Total Bilirubin: 2 mg/dL — ABNORMAL HIGH (ref 0.3–1.2)
Total Protein: 7 g/dL (ref 6.5–8.1)

## 2021-06-23 LAB — CBC WITH DIFFERENTIAL/PLATELET
Abs Immature Granulocytes: 0.18 10*3/uL — ABNORMAL HIGH (ref 0.00–0.07)
Basophils Absolute: 0 10*3/uL (ref 0.0–0.1)
Basophils Relative: 1 %
Eosinophils Absolute: 0.1 10*3/uL (ref 0.0–0.5)
Eosinophils Relative: 2 %
HCT: 42.3 % (ref 39.0–52.0)
Hemoglobin: 13.4 g/dL (ref 13.0–17.0)
Immature Granulocytes: 3 %
Lymphocytes Relative: 10 %
Lymphs Abs: 0.5 10*3/uL — ABNORMAL LOW (ref 0.7–4.0)
MCH: 28.3 pg (ref 26.0–34.0)
MCHC: 31.7 g/dL (ref 30.0–36.0)
MCV: 89.2 fL (ref 80.0–100.0)
Monocytes Absolute: 0.3 10*3/uL (ref 0.1–1.0)
Monocytes Relative: 5 %
Neutro Abs: 4.4 10*3/uL (ref 1.7–7.7)
Neutrophils Relative %: 79 %
Platelets: 73 10*3/uL — ABNORMAL LOW (ref 150–400)
RBC: 4.74 MIL/uL (ref 4.22–5.81)
RDW: 16.9 % — ABNORMAL HIGH (ref 11.5–15.5)
WBC: 5.5 10*3/uL (ref 4.0–10.5)
nRBC: 0.5 % — ABNORMAL HIGH (ref 0.0–0.2)

## 2021-06-23 LAB — BASIC METABOLIC PANEL
Anion gap: 9 (ref 5–15)
BUN: 24 mg/dL — ABNORMAL HIGH (ref 8–23)
CO2: 29 mmol/L (ref 22–32)
Calcium: 9.5 mg/dL (ref 8.9–10.3)
Chloride: 101 mmol/L (ref 98–111)
Creatinine, Ser: 1.91 mg/dL — ABNORMAL HIGH (ref 0.61–1.24)
GFR, Estimated: 38 mL/min — ABNORMAL LOW (ref >60)
Glucose, Bld: 125 mg/dL — ABNORMAL HIGH (ref 70–99)
Potassium: 3.8 mmol/L (ref 3.5–5.1)
Sodium: 139 mmol/L (ref 135–145)

## 2021-06-23 LAB — BRAIN NATRIURETIC PEPTIDE: B Natriuretic Peptide: 186 pg/mL — ABNORMAL HIGH (ref 0.0–100.0)

## 2021-06-23 LAB — PROTIME-INR
INR: 2.5 — ABNORMAL HIGH (ref 0.8–1.2)
Prothrombin Time: 26.6 seconds — ABNORMAL HIGH (ref 11.4–15.2)

## 2021-06-23 LAB — TROPONIN I (HIGH SENSITIVITY)
Troponin I (High Sensitivity): 28 ng/L — ABNORMAL HIGH (ref <18)
Troponin I (High Sensitivity): 27 ng/L — ABNORMAL HIGH (ref <18)

## 2021-06-23 LAB — RESP PANEL BY RT-PCR (FLU A&B, COVID) ARPGX2
Influenza A by PCR: NEGATIVE
Influenza B by PCR: NEGATIVE
SARS Coronavirus 2 by RT PCR: NEGATIVE

## 2021-06-23 MED ORDER — FUROSEMIDE 8 MG/ML PO SOLN: 40.0000 mg | Freq: Once | ORAL | Status: DC

## 2021-06-23 MED ORDER — PREDNISONE 5 MG PO TABS
5.0000 mg | ORAL_TABLET | Freq: Every day | ORAL | Status: DC
  Administered 2021-06-24 – 2021-06-27 (×4): 5 mg via ORAL
  Filled 2021-06-23 (×4): qty 1

## 2021-06-23 MED ORDER — FUROSEMIDE 10 MG/ML IJ SOLN
80.0000 mg | Freq: Two times a day (BID) | INTRAMUSCULAR | Status: DC
  Administered 2021-06-24 – 2021-06-26 (×6): 80 mg via INTRAVENOUS
  Filled 2021-06-23 (×7): qty 8

## 2021-06-23 MED ORDER — ASPIRIN EC 81 MG PO TBEC
81.0000 mg | DELAYED_RELEASE_TABLET | Freq: Every day | ORAL | Status: DC
  Administered 2021-06-24: 81 mg via ORAL
  Filled 2021-06-23: qty 1

## 2021-06-23 MED ORDER — ONDANSETRON HCL 4 MG/2ML IJ SOLN: 4.0000 mg | Freq: Four times a day (QID) | INTRAMUSCULAR | Status: DC | PRN

## 2021-06-23 MED ORDER — VITAMIN B-12 1000 MCG PO TABS
500.0000 ug | ORAL_TABLET | Freq: Every day | ORAL | Status: DC
  Administered 2021-06-24 – 2021-06-27 (×4): 500 ug via ORAL
  Filled 2021-06-23 (×4): qty 1

## 2021-06-23 MED ORDER — WARFARIN SODIUM 2 MG PO TABS
4.0000 mg | ORAL_TABLET | Freq: Once | ORAL | Status: AC
  Administered 2021-06-23: 4 mg via ORAL
  Filled 2021-06-23: qty 1

## 2021-06-23 MED ORDER — FERROUS SULFATE 325 (65 FE) MG PO TABS
325.0000 mg | ORAL_TABLET | Freq: Every day | ORAL | Status: DC
  Administered 2021-06-24 – 2021-06-27 (×4): 325 mg via ORAL
  Filled 2021-06-23 (×4): qty 1

## 2021-06-23 MED ORDER — SODIUM CHLORIDE 0.9 % IV SOLN: 250.0000 mL | INTRAVENOUS | Status: DC | PRN

## 2021-06-23 MED ORDER — ACETAMINOPHEN 325 MG PO TABS: 650.0000 mg | ORAL_TABLET | ORAL | Status: DC | PRN

## 2021-06-23 MED ORDER — POTASSIUM CHLORIDE CRYS ER 20 MEQ PO TBCR
40.0000 meq | EXTENDED_RELEASE_TABLET | Freq: Two times a day (BID) | ORAL | Status: DC
  Administered 2021-06-23 – 2021-06-27 (×8): 40 meq via ORAL
  Filled 2021-06-23 (×8): qty 2

## 2021-06-23 MED ORDER — TACROLIMUS 1 MG PO CAPS
1.0000 mg | ORAL_CAPSULE | ORAL | Status: DC
  Administered 2021-06-24 – 2021-06-26 (×2): 1 mg via ORAL
  Filled 2021-06-23 (×3): qty 1

## 2021-06-23 MED ORDER — AMIODARONE HCL 200 MG PO TABS
200.0000 mg | ORAL_TABLET | Freq: Every day | ORAL | Status: DC
  Administered 2021-06-24 – 2021-06-27 (×4): 200 mg via ORAL
  Filled 2021-06-23 (×4): qty 1

## 2021-06-23 MED ORDER — DULOXETINE HCL 60 MG PO CPEP
60.0000 mg | ORAL_CAPSULE | Freq: Every day | ORAL | Status: DC
  Administered 2021-06-23 – 2021-06-26 (×4): 60 mg via ORAL
  Filled 2021-06-23 (×4): qty 1

## 2021-06-23 MED ORDER — WARFARIN - PHARMACIST DOSING INPATIENT: Freq: Every day | Status: DC

## 2021-06-23 MED ORDER — ATORVASTATIN CALCIUM 10 MG PO TABS
20.0000 mg | ORAL_TABLET | Freq: Every day | ORAL | Status: DC
  Administered 2021-06-23 – 2021-06-26 (×4): 20 mg via ORAL
  Filled 2021-06-23 (×4): qty 2

## 2021-06-23 MED ORDER — FLUTICASONE PROPIONATE 50 MCG/ACT NA SUSP
2.0000 | Freq: Every day | NASAL | Status: DC | PRN
  Filled 2021-06-23: qty 16

## 2021-06-23 MED ORDER — FUROSEMIDE 20 MG PO TABS
40.0000 mg | ORAL_TABLET | Freq: Once | ORAL | Status: AC
  Administered 2021-06-23: 40 mg via ORAL
  Filled 2021-06-23: qty 2

## 2021-06-23 MED ORDER — FUROSEMIDE 10 MG/ML IJ SOLN
40.0000 mg | Freq: Once | INTRAMUSCULAR | Status: AC
  Administered 2021-06-23: 40 mg via INTRAVENOUS
  Filled 2021-06-23: qty 4

## 2021-06-23 MED ORDER — MELATONIN 3 MG PO TABS
1.5000 mg | ORAL_TABLET | Freq: Every day | ORAL | Status: DC
  Administered 2021-06-24 – 2021-06-26 (×2): 1.5 mg via ORAL
  Filled 2021-06-23 (×3): qty 1

## 2021-06-23 MED ORDER — SODIUM CHLORIDE 0.9% FLUSH
3.0000 mL | INTRAVENOUS | Status: DC | PRN
  Administered 2021-06-25: 3 mL via INTRAVENOUS

## 2021-06-23 MED ORDER — TRAZODONE HCL 50 MG PO TABS: 150.0000 mg | ORAL_TABLET | Freq: Every evening | ORAL | Status: DC | PRN

## 2021-06-23 MED ORDER — OMEGA-3-ACID ETHYL ESTERS 1 G PO CAPS
2.0000 g | ORAL_CAPSULE | Freq: Every day | ORAL | Status: DC
  Administered 2021-06-24 – 2021-06-27 (×4): 2 g via ORAL
  Filled 2021-06-23 (×4): qty 2

## 2021-06-23 MED ORDER — ALLOPURINOL 100 MG PO TABS
100.0000 mg | ORAL_TABLET | Freq: Every day | ORAL | Status: DC
  Administered 2021-06-24 – 2021-06-27 (×4): 100 mg via ORAL
  Filled 2021-06-23 (×4): qty 1

## 2021-06-23 MED ORDER — TACROLIMUS 0.5 MG PO CAPS
0.5000 mg | ORAL_CAPSULE | ORAL | Status: DC
  Administered 2021-06-25 – 2021-06-27 (×2): 0.5 mg via ORAL
  Filled 2021-06-23 (×2): qty 1

## 2021-06-23 MED ORDER — MAGNESIUM OXIDE -MG SUPPLEMENT 400 (240 MG) MG PO TABS
400.0000 mg | ORAL_TABLET | Freq: Two times a day (BID) | ORAL | Status: DC
  Administered 2021-06-23 – 2021-06-27 (×8): 400 mg via ORAL
  Filled 2021-06-23 (×8): qty 1

## 2021-06-23 MED ORDER — ZOLPIDEM TARTRATE 5 MG PO TABS
5.0000 mg | ORAL_TABLET | Freq: Every evening | ORAL | Status: DC | PRN
  Administered 2021-06-24 – 2021-06-27 (×4): 5 mg via ORAL
  Filled 2021-06-23 (×6): qty 1

## 2021-06-23 MED ORDER — NITROGLYCERIN 0.4 MG SL SUBL: 0.4000 mg | SUBLINGUAL_TABLET | SUBLINGUAL | Status: DC | PRN

## 2021-06-23 MED ORDER — CALCITRIOL 0.25 MCG PO CAPS
0.2500 ug | ORAL_CAPSULE | Freq: Every day | ORAL | Status: DC
  Administered 2021-06-24 – 2021-06-27 (×4): 0.25 ug via ORAL
  Filled 2021-06-23 (×4): qty 1

## 2021-06-23 MED ORDER — ALBUTEROL SULFATE (2.5 MG/3ML) 0.083% IN NEBU: 2.5000 mg | INHALATION_SOLUTION | RESPIRATORY_TRACT | Status: DC | PRN

## 2021-06-23 MED ORDER — QUETIAPINE FUMARATE 50 MG PO TABS
150.0000 mg | ORAL_TABLET | Freq: Every day | ORAL | Status: DC
  Administered 2021-06-23 – 2021-06-26 (×4): 150 mg via ORAL
  Filled 2021-06-23: qty 3
  Filled 2021-06-23: qty 6
  Filled 2021-06-23 (×2): qty 3

## 2021-06-23 MED ORDER — PNEUMOCOCCAL VAC POLYVALENT 25 MCG/0.5ML IJ INJ
0.5000 mL | INJECTION | INTRAMUSCULAR | Status: AC
  Administered 2021-06-24: 0.5 mL via INTRAMUSCULAR
  Filled 2021-06-23: qty 0.5

## 2021-06-23 MED ORDER — SODIUM CHLORIDE 0.9% FLUSH
3.0000 mL | Freq: Two times a day (BID) | INTRAVENOUS | Status: DC
  Administered 2021-06-23 – 2021-06-27 (×6): 3 mL via INTRAVENOUS

## 2021-06-23 NOTE — ED Provider Notes (Signed)
[facility] EMERGENCY DEPARTMENT Provider Note   CSN: [id number] Arrival date & time: 06/23/21  1614     History Chief Complaint  Patient presents with   Shortness of Breath    [name] is a 70 y.o. male with a past medical history of permanent A. fib on Coumadin, idiopathic thrombocytopenia, end-stage renal disease status post kidney and liver transplant, history of CHF who presents emergency department sent over from his cardiologist office for shortness of breath.  Patient states that he has recurrent "fluid" buildup that has been more frequent over the past month.  He states that normally he gets swelling in his abdomen and feels like he has about a 10 pound weight gain with abdominal distention today.  He has associated exertional dyspnea, orthopnea.  He has been using his diuretics without relief of his symptoms.  He has had a productive cough.  He denies fevers, chills, hemoptysis, unilateral leg swelling, chest pain.  He is status post CABG/MI.   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   Chronic systolic CHF (congestive heart failure) ([place]) 12/17/2016   Sepsis ([place]) 12/17/2016   History of liver transplant ([place]) 12/17/2016   History of kidney transplant 12/17/2016   Acute respiratory failure with hypoxia ([place]) 12/17/2016   CKD (chronic kidney disease), stage III ([place]) 12/17/2016   Idiopathic thrombocytopenia (HCC)    HTN (hypertension)     HLD (hyperlipidemia)    CAD (coronary artery disease)    Long term (current) use of anticoagulants [Z79.01] 11/07/2015   Encounter for therapeutic drug monitoring 11/07/2015   Atrial fibrillation ([place]) 08/08/2009    Past Surgical History:  Procedure Laterality Date   APPENDECTOMY     CORONARY ARTERY BYPASS GRAFT     FACIAL COSMETIC SURGERY     GALLBLADDER SURGERY     HERNIA REPAIR     KIDNEY TRANSPLANT     LEFT HEART CATH AND CORONARY ANGIOGRAPHY N/A 08/07/2018   Procedure: LEFT HEART CATH AND CORONARY ANGIOGRAPHY;  Surgeon: Sherren Mocha, MD;  Location: [facility];  Service: Cardiovascular;  Laterality: N/A;   LIVER TRANSPLANT         Family History  Problem Relation Age of Onset   Atrial fibrillation Mother    Breast cancer Mother    Renal cancer Father    Hypertension Brother    Hyperlipidemia Brother    Other Brother        stent    Renal Disease Maternal Grandmother    Stroke Maternal Grandmother    Heart attack Maternal Grandfather    Cancer Paternal Grandmother    Heart failure Paternal Grandfather    Emphysema Paternal Grandfather    Bronchiolitis Paternal Grandfather     Social History   Tobacco Use   Smoking status: Never   Smokeless tobacco: Never  Substance Use Topics   Alcohol use: No   Drug use: No  Home Medications Prior to Admission medications   Medication Sig Start Date End Date Taking? Authorizing Provider  acetaminophen (TYLENOL) 500 MG tablet Take 1,000 mg by mouth every 6 (six) hours as needed for mild pain or headache.    Yes [provider]  allopurinol (ZYLOPRIM) 100 MG tablet Take 100 mg by mouth daily.   Yes [provider]  amiodarone (PACERONE) 200 MG tablet TAKE 1 TABLET BY MOUTH EVERY DAY Patient taking differently: Take 200 mg by mouth daily. 04/27/21  Yes Deboraha Sprang, MD  atorvastatin (LIPITOR) 20 MG tablet Take 20 mg by mouth at bedtime.    Yes [provider]  calcitRIOL  (ROCALTROL) 0.25 MCG capsule Take 0.25 mcg by mouth daily.   Yes [provider]  Cholecalciferol (VITAMIN D3) 25 MCG (1000 UT) CAPS Take 1,000 Units by mouth daily.    Yes [provider]  Cyanocobalamin (VITAMIN B12) 500 MCG TABS Take 500 mcg by mouth daily.    Yes [provider]  DULoxetine (CYMBALTA) 60 MG capsule Take 60 mg by mouth at bedtime.   Yes [provider]  EPINEPHrine (EPI-PEN) 0.3 mg/0.3 mL DEVI Inject 0.3 mg into the muscle once as needed (severe allergic reaction).   Yes [provider]  ferrous sulfate 325 (65 FE) MG tablet Take 325 mg by mouth daily with breakfast.   Yes [provider]  fish oil-omega-3 fatty acids 1000 MG capsule Take 2 g by mouth daily.   Yes [provider]  fluticasone (FLONASE) 50 MCG/ACT nasal spray Place 2 sprays into both nostrils daily as needed for allergies or rhinitis.    Yes [provider]  furosemide (LASIX) 40 MG tablet Take 40 mg by mouth 2 (two) times daily.   Yes [provider]  MAGNESIUM-OXIDE 400 (240 Mg) MG tablet Take 400 mg by mouth 2 (two) times daily. 05/04/21  Yes [provider]  melatonin 5 MG TABS Take 5 mg by mouth at bedtime.   Yes [provider]  metolazone (ZAROXOLYN) 5 MG tablet Take 5 mg by mouth See admin instructions. Takes 5 mg twice weekly 09/14/18  Yes [provider]  nitroGLYCERIN (NITROSTAT) 0.4 MG SL tablet Place 0.4 mg under the tongue every 5 (five) minutes as needed for chest pain.   Yes [provider]  potassium chloride SA (KLOR-CON) 20 MEQ tablet Take 2 tablets by mouth 3 (three) times daily.   Yes [provider]  predniSONE (DELTASONE) 5 MG tablet Take 1 tablet (5 mg total) by mouth daily. 12/29/16  Yes [name], MD  QUEtiapine (SEROQUEL) 50 MG tablet Take 150 mg by mouth at bedtime. 08/13/19  Yes [provider]  tacrolimus (PROGRAF) 0.5 MG capsule Take 0.5 mg by  mouth See admin instructions. Takes 1 tablet by mouth one day and 2 tablets next day (1 and 2 tablets on alternate days)   Yes [provider]  traZODone (DESYREL) 150 MG tablet Take 150 mg by mouth at bedtime as needed for sleep.    Yes [provider]  warfarin (COUMADIN) 4 MG tablet TAKE AS DIRECTED BY COUMADIN CLINIC Patient taking differently: Take 2-4 mg by mouth See admin instructions. Takes 4 mg on Tuesdays and 2 mg on all other days 09/19/20  Yes Deboraha Sprang, MD  albuterol (PROVENTIL) (2.5 MG/3ML) 0.083% nebulizer solution Take 2.5 mg by nebulization every 2 (two) hours as needed for wheezing or shortness of breath. Patient not taking: Reported  on 06/23/2021    [provider]  carvedilol (COREG) 6.25 MG tablet TAKE 1 TABLET BY MOUTH IN THE MORNING AND 2 TABLETS AT NIGHT Patient not taking: No sig reported 03/11/21   Deboraha Sprang, MD  losartan (COZAAR) 25 MG tablet Take 1 tablet by mouth daily. Patient not taking: No sig reported 05/11/21 05/11/22  [provider]    Allergies    Other, Grapefruit concentrate, Haloperidol, Nsaids, Rifampin, and Triazolam  Review of Systems   Review of Systems  Respiratory:  Positive for shortness of breath.   Ten systems reviewed and are negative for acute change, except as noted in the HPI.   Physical Exam Updated Vital Signs BP (!) 142/98   Pulse 86   Temp 98.2 F (36.8 C) (Oral)   Resp (!) 27   Ht 5\' 10"  (1.778 m)   Wt 87.6 kg Comment: scale C  SpO2 91%   BMI 27.71 kg/m   Physical Exam Vitals and nursing note reviewed.  Constitutional:      General: He is not in acute distress.    Appearance: He is well-developed. He is not diaphoretic.  HENT:     Head: Normocephalic and atraumatic.  Eyes:     General: No scleral icterus.    Conjunctiva/sclera: Conjunctivae normal.  Cardiovascular:     Rate and Rhythm: Normal rate and regular rhythm.     Heart sounds: Normal heart sounds.  Pulmonary:      Effort: Pulmonary effort is normal. Tachypnea present. No respiratory distress.     Breath sounds: Examination of the right-lower field reveals rales. Examination of the left-lower field reveals rales. Rales present.  Abdominal:     General: There is distension.     Palpations: Abdomen is soft.     Tenderness: There is no abdominal tenderness.     Comments: Multiple surgical scars  Musculoskeletal:     Cervical back: Normal range of motion and neck supple.     Right lower leg: No edema.     Left lower leg: No edema.  Skin:    General: Skin is warm and dry.  Neurological:     Mental Status: He is alert.  Psychiatric:        Behavior: Behavior normal.    ED Results / Procedures / Treatments   Labs (all labs ordered are listed, but only abnormal results are displayed) Labs Reviewed  BASIC METABOLIC PANEL - Abnormal; Notable for the following components:      Result Value   Glucose, Bld 125 (*)    BUN 24 (*)    Creatinine, Ser 1.91 (*)    GFR, Estimated 38 (*)    All other components within normal limits  CBC WITH DIFFERENTIAL/PLATELET - Abnormal; Notable for the following components:   RDW 16.9 (*)    Platelets 73 (*)    nRBC 0.5 (*)    Lymphs Abs 0.5 (*)    Abs Immature Granulocytes 0.18 (*)    All other components within normal limits  BRAIN NATRIURETIC PEPTIDE - Abnormal; Notable for the following components:   B Natriuretic Peptide 186.0 (*)    All other components within normal limits  PROTIME-INR - Abnormal; Notable for the following components:   Prothrombin Time 26.6 (*)    INR 2.5 (*)    All other components within normal limits  HEPATIC FUNCTION PANEL - Abnormal; Notable for the following components:   Total Bilirubin 2.0 (*)    Bilirubin, Direct 0.3 (*)  Indirect Bilirubin 1.7 (*)    All other components within normal limits  TROPONIN I (HIGH SENSITIVITY) - Abnormal; Notable for the following components:   Troponin I (High Sensitivity) 28 (*)    All other  components within normal limits  TROPONIN I (HIGH SENSITIVITY) - Abnormal; Notable for the following components:   Troponin I (High Sensitivity) 27 (*)    All other components within normal limits  RESP PANEL BY RT-PCR (FLU A&B, COVID) ARPGX2  PROTIME-INR  HIV ANTIBODY (ROUTINE TESTING W REFLEX)  BASIC METABOLIC PANEL  MAGNESIUM  TSH    EKG EKG Interpretation  Date/Time:  Tuesday June 23 2021 16:29:45 EDT Ventricular Rate:  76 PR Interval:    QRS Duration: 131 QT Interval:  549 QTC Calculation: 618 R Axis:   96 Text Interpretation: Atrial fibrillation Nonspecific intraventricular conduction delay Inferoposterior infarct, recent Probable anterolateral infarct, old Confirmed by Lennice Sites [id number]) on 06/23/2021 4:31:01 PM  Radiology DG Chest Port 1 View  Result Date: 06/23/2021 CLINICAL DATA:  Shortness of breath EXAM: PORTABLE CHEST 1 VIEW COMPARISON:  Chest radiograph 08/29/2020 FINDINGS: The heart is significantly enlarged, similar to the prior study. The mediastinal contours are stable. Median sternotomy wires and mediastinal surgical clips are stable. There are increased interstitial markings bilaterally. There are patchy opacities in the right base with a small right pleural effusion. The left costophrenic angle is cut off; a left pleural effusion cannot be excluded. The left upper lung is well aerated. There is no pneumothorax. The bones are unremarkable. IMPRESSION: 1. Small right pleural effusion with adjacent airspace disease which may reflect atelectasis or pneumonia. Recommend follow-up radiographs in 6-8 weeks to assess for resolution. 2. Cardiomegaly with mild pulmonary interstitial edema. 3. A left pleural effusion cannot be excluded as the costophrenic angle is not included in the field of view. Electronically Signed   By: Valetta Mole M.D.   On: 06/23/2021 16:49    Procedures Procedures   Medications Ordered in ED Medications  allopurinol (ZYLOPRIM) tablet 100 mg  (has no administration in time range)  amiodarone (PACERONE) tablet 200 mg (has no administration in time range)  atorvastatin (LIPITOR) tablet 20 mg (has no administration in time range)  nitroGLYCERIN (NITROSTAT) SL tablet 0.4 mg (has no administration in time range)  DULoxetine (CYMBALTA) DR capsule 60 mg (has no administration in time range)  QUEtiapine (SEROQUEL) tablet 150 mg (has no administration in time range)  traZODone (DESYREL) tablet 150 mg (has no administration in time range)  vitamin B-12 (CYANOCOBALAMIN) tablet 500 mcg (has no administration in time range)  ferrous sulfate tablet 325 mg (has no administration in time range)  magnesium oxide (MAG-OX) tablet 400 mg (has no administration in time range)  potassium chloride SA (KLOR-CON) CR tablet 40 mEq (has no administration in time range)  omega-3 acid ethyl esters (LOVAZA) capsule 2 g (has no administration in time range)  albuterol (PROVENTIL) (2.5 MG/3ML) 0.083% nebulizer solution 2.5 mg (has no administration in time range)  fluticasone (FLONASE) 50 MCG/ACT nasal spray 2 spray (has no administration in time range)  predniSONE (DELTASONE) tablet 5 mg (has no administration in time range)  calcitRIOL (ROCALTROL) capsule 0.25 mcg (has no administration in time range)  tacrolimus (PROGRAF) capsule 0.5 mg (has no administration in time range)  melatonin tablet 1.5 mg (has no administration in time range)  sodium chloride flush (NS) 0.9 % injection 3 mL (has no administration in time range)  sodium chloride flush (NS) 0.9 % injection 3 mL (  has no administration in time range)  0.9 %  sodium chloride infusion (has no administration in time range)  acetaminophen (TYLENOL) tablet 650 mg (has no administration in time range)  ondansetron (ZOFRAN) injection 4 mg (has no administration in time range)  aspirin EC tablet 81 mg (has no administration in time range)  zolpidem (AMBIEN) tablet 5 mg (has no administration in time range)   furosemide (LASIX) injection 80 mg (has no administration in time range)  Warfarin - Pharmacist Dosing Inpatient (has no administration in time range)  pneumococcal 23 valent vaccine (PNEUMOVAX-23) injection 0.5 mL (has no administration in time range)  tacrolimus (PROGRAF) capsule 1 mg (has no administration in time range)  furosemide (LASIX) injection 40 mg (40 mg Intravenous Given 06/23/21 1907)  furosemide (LASIX) tablet 40 mg (40 mg Oral Given 06/23/21 2045)  warfarin (COUMADIN) tablet 4 mg (4 mg Oral Given 06/23/21 2217)    ED Course  I have reviewed the triage vital signs and the nursing notes.  Pertinent labs & imaging results that were available during my care of the patient were reviewed by me and considered in my medical decision making (see chart for details).    MDM Rules/Calculators/A&P                           CC:SOB VS: BP (!) 142/98   Pulse 86   Temp 98.2 F (36.8 C) (Oral)   Resp (!) 27   Ht 5\' 10"  (1.778 m)   Wt 87.6 kg Comment: scale C  SpO2 91%   BMI 27.71 kg/m   VO:HYWVPXT is gathered by  EMR and patient . Previous records obtained and reviewed. DDX:The patient's complaint of sob involves an extensive number of diagnostic and treatment options, and is a complaint that carries with it a high risk of complications, morbidity, and potential mortality. Given the large differential diagnosis, medical decision making is of high complexity. The emergent differential diagnosis for shortness of breath includes, but is not limited to, Pulmonary edema, bronchoconstriction, Pneumonia, Pulmonary embolism, Pneumotherax/ Hemothorax, Dysrythmia, ACS.   Labs: I ordered reviewed and interpreted labs which include CBC which shows significant thrombocytopenia, low lymphocyte count , BMP with mildly elevated glucose, BUN and creatinine at patient's baseline, PT/INR is therapeutic.  BNP is slightly elevated but lower than previous.  Troponin is slightly elevated likely due to  demand.  Respiratory panel negative for COVID or influenza. Imaging: I ordered and reviewed images which included 1 view chest x-ray. I independently visualized and interpreted all imaging. Significant findings include edema and pleural effusion.  EKG: Rate controlled A. fib at a rate of 76 Consults: Cardiology MDM: Patient here with shortness of breath.  He appears to be in CHF exacerbation.  Started patient on Lasix with strict intake and output measurements.  Patient will be admitted by cardiology.  He is stable throughout his ED course. Patient disposition:The patient appears reasonably stabilized for admission considering the current resources, flow, and capabilities available in the ED at this time, and I doubt any other [facility] requiring further screening and/or treatment in the ED prior to admission.       Final Clinical Impression(s) / ED Diagnoses Final diagnoses:  Acute on chronic congestive heart failure, unspecified heart failure type [facility])    Rx / DC Orders ED Discharge Orders     None        Margarita Mail, PA-C 06/23/21 2237    Lennice Sites, DO  06/23/21 2322  

## 2021-06-23 NOTE — Progress Notes (Signed)
Patient Care Team: [name]., MD as PCP - General (Family Medicine) Deboraha Sprang, MD as Consulting Physician (Cardiology) Jamal Maes, MD as Consulting Physician (Nephrology)   HPI  [name] is a 70 y.o. male seen in followup for permanent atrial fibrillation in the context of CAD with prior CABG and EF 41%;  he has a history of kidney and liver transplant 2008 and is followed at [facility] He was here a few weeks ago and was short of breath and was hospitalized.  He was seen by [facility] cardiology.  Echo is as below.  Medications were adjusted.  Because of post discharge hypotension metoprolol and losartan were discontinued  They also stopped his metolazone which he resumed on his own because of worsening dyspnea.    Records and Results Reviewed -HOLTER  6/16  HR excursion quite broad and carvidolol doubled DATE TEST EF   1/16 Myoview  41   anterolateral and inferior scars   9/16 echo  45-50 Reduced RVSF/Mod LAE   2/18 Echo  40-45   11/18 Echo  55-65%   9/19 Echo  35-40%   10/19 LHC  LIMA  SVG R SVG PD patent SVG OM occluded  1/20 Echo  35-40% LVH 13-59mm  6/22 Echo (CE)  45%        Hospitalized 9/19 with ventricular tachycardia.  He was seen in consult by Dr. Greggory Brandy.  He has previously had been 55-60% and beta-blockers were initiated.  With repeat echo demonstrating EF 40% or so it was elected to put him on amiodarone.     Patient denies symptoms of GI intolerance, sun sensitivity, neurological symptoms attributable to amiodarone.   Date Cr K Hgb TSH LFTs  1/20 1.75 3.7        8/20 1.52 3.8    14  3/21 2.02 3.3 14.1    7/22 (CE) 1.9 3.6 13.1        The patient is a DNR.  He has "no quality of life "with his multiple medical problems, while acknowledging that he is depressed, is having been treated for many years, he is ready to die.  He does not want to have VT again No intercurrent VT  Tolerating amio Patient denies symptoms of GI intolerance, sun  sensitivity, neurological symptoms attributable to amiodarone.   Surveillance labs necessary   No chest pain, chronic stable sob, mild edema  Past Medical History:  Diagnosis Date   Atrial fibrillation ([place]) 08/2009   CAD (coronary artery disease)    s/p CABG -- 1992   ESRD (end stage renal disease) ([place])    HLD (hyperlipidemia)    HTN (hypertension)    Idiopathic thrombocytopenia ([place])     Past Surgical History:  Procedure Laterality Date   APPENDECTOMY     CORONARY ARTERY BYPASS GRAFT     FACIAL COSMETIC SURGERY     GALLBLADDER SURGERY     HERNIA REPAIR     KIDNEY TRANSPLANT     LEFT HEART CATH AND CORONARY ANGIOGRAPHY N/A 08/07/2018   Procedure: LEFT HEART CATH AND CORONARY ANGIOGRAPHY;  Surgeon: Sherren Mocha, MD;  Location: [place] [facility];  Service: Cardiovascular;  Laterality: N/A;   LIVER TRANSPLANT      Current Outpatient Medications  Medication Sig Dispense Refill   acetaminophen (TYLENOL) 500 MG tablet Take 1,000 mg by mouth every 6 (six) hours as needed for mild pain or headache.      albuterol (PROVENTIL) (2.5 MG/3ML) 0.083%  nebulizer solution Take 2.5 mg by nebulization every 2 (two) hours as needed for wheezing or shortness of breath.     allopurinol (ZYLOPRIM) 100 MG tablet Take 100 mg by mouth daily.     amiodarone (PACERONE) 200 MG tablet TAKE 1 TABLET BY MOUTH EVERY DAY 30 tablet 5   atorvastatin (LIPITOR) 20 MG tablet Take 20 mg by mouth at bedtime.      calcitRIOL (ROCALTROL) 0.25 MCG capsule Take 1 capsule by mouth daily.      Cholecalciferol (VITAMIN D3) 25 MCG (1000 UT) CAPS Take 1,000 Units by mouth daily.      Cyanocobalamin (VITAMIN B12) 500 MCG TABS Take 500 mcg by mouth daily.      DULoxetine (CYMBALTA) 60 MG capsule Take 60 mg by mouth at bedtime.     EPINEPHrine (EPI-PEN) 0.3 mg/0.3 mL DEVI Inject 0.3 mg into the muscle once as needed (severe allergic reaction).     ferrous sulfate 325 (65 FE) MG tablet Take 325 mg by mouth daily with  breakfast.     fish oil-omega-3 fatty acids 1000 MG capsule Take 2 g by mouth daily.     fluticasone (FLONASE) 50 MCG/ACT nasal spray Place 2 sprays into both nostrils daily as needed for allergies or rhinitis.      furosemide (LASIX) 40 MG tablet Take 40 mg by mouth 2 (two) times daily.     MAGNESIUM-OXIDE 400 (240 Mg) MG tablet Take 1 tablet by mouth 2 (two) times daily.     Melatonin 1 MG TABS Take 1 mg by mouth at bedtime.     metolazone (ZAROXOLYN) 5 MG tablet Take 0.5 tablets by mouth once a week.     midodrine (PROAMATINE) 5 MG tablet Take 5 mg by mouth 3 (three) times daily.     nitroGLYCERIN (NITROSTAT) 0.4 MG SL tablet Place 0.4 mg under the tongue every 5 (five) minutes as needed for chest pain.     potassium chloride SA (KLOR-CON) 20 MEQ tablet Take 2 tablets by mouth 2 (two) times daily. Pt is to take 35mEq K on days he takes Metolazone.      predniSONE (DELTASONE) 5 MG tablet Take 1 tablet (5 mg total) by mouth daily.     QUEtiapine (SEROQUEL) 50 MG tablet Take 3 tablets by mouth at bedtime.     tacrolimus (PROGRAF) 0.5 MG capsule Take 0.5 mg by mouth 2 (two) times daily.      traZODone (DESYREL) 150 MG tablet Take 150 mg by mouth at bedtime as needed for sleep.      warfarin (COUMADIN) 4 MG tablet TAKE AS DIRECTED BY COUMADIN CLINIC 65 tablet 1   carvedilol (COREG) 6.25 MG tablet TAKE 1 TABLET BY MOUTH IN THE MORNING AND 2 TABLETS AT NIGHT (Patient not taking: Reported on 06/23/2021) 270 tablet 2   losartan (COZAAR) 25 MG tablet Take 1 tablet by mouth daily. (Patient not taking: Reported on 06/23/2021)     metoprolol succinate (TOPROL-XL) 25 MG 24 hr tablet Take by mouth. (Patient not taking: Reported on 06/23/2021)     No current facility-administered medications for this visit.    Allergies  Allergen Reactions   Other Anaphylaxis    Spider venom   Grapefruit Concentrate Other (See Comments)    Pt has been told not to eat grapefruit   Haloperidol Other (See Comments)     Caused the patient to be overly-sedated   Nsaids Other (See Comments)    Cannot take due to liver transplant  Rifampin Other (See Comments)    Caused flushing   Triazolam Other (See Comments)    Caused the patient to be overly-sedated      Review of Systems negative except from HPI and PMH  Physical Exam Pulse 85   Ht 5\' 10"  (1.778 m)   Wt 199 lb 3.2 oz (90.4 kg)   SpO2 92%   BMI 28.58 kg/m   Well developed and nourished in moderate respiratory distress HENT normal Neck supple with JVP-  flat >10  Crackles right lung halfway up Irregular rate and rhythm no murmurs or gallops Abd-soft with active BS No Clubbing cyanosis edema Skin-warm and dry A & Oriented  Grossly normal sensory and motor function     ECG atrial fibrillation at 85 Intervals-/13/43    Assessment and  Plan  Atrial fibrillation permanent  CAD  S/p CABG  Congestive heart failure acute/chronic-systolic/diastolic  Hypotension  High Risk Medication Surveillance Amiodarone  Status post renal transplant\\  Ventricular tachycardia  End of LIfe discussion    Atrial fibrillation is controlled.  On anticoagulation with warfarin  He presents today with severe shortness of breath.  Unable to lie flat.  Respiratory rate is about 30 times a minute. Examination of the right lung.  Neck veins to the jaw.  We will plan to send him to the hospital.  Diuretic will have to be determined following blood work.  May benefit from inotropic support.  Suspect heart failure consultation would be a great thing.  In the past he has been DNR.  This will need to be clarified

## 2021-06-23 NOTE — ED Provider Notes (Signed)
I personally evaluated the patient during the encounter and completed a history, physical, procedures, medical decision making to contribute to the overall care of the patient and decision making for the patient briefly, the patient is a 70 y.o. male is here with shortness of breath.  Sent by cardiology office.  History of liver transplant, renal transplant, atrial fibrillation on Coumadin.  Has had shortness of breath here progressively.  Feels like his fluid medications not helping him as much.  He arrives with A. fib but rate controlled.  Overall unremarkable vitals but does have increased work of breathing.  Chest x-ray appears consistent with volume overload and likely some pleural effusions.  Creatinine is 1.9.  Troponin mildly elevated, BNP mildly elevated.  Liver enzymes overall unremarkable.  No abdominal tenderness on exam.  Overall does appear to be volume overloaded likely multifactorial.  Will admit for diuresis and further work-up.  INR is at goal.  PE would be less likely.  This chart was dictated using voice recognition software.  Despite best efforts to proofread,  errors can occur which can change the documentation meaning.    EKG Interpretation  Date/Time:  Tuesday June 23 2021 16:29:45 EDT Ventricular Rate:  76 PR Interval:    QRS Duration: 131 QT Interval:  549 QTC Calculation: 618 R Axis:   96 Text Interpretation: Atrial fibrillation Nonspecific intraventricular conduction delay Inferoposterior infarct, recent Probable anterolateral infarct, old Confirmed by Ronnald Nian, Cleo Villamizar [phone]) on 06/23/2021 4:31:01 PM            Lennice Sites, DO 06/23/21 1850

## 2021-06-23 NOTE — ED Triage Notes (Signed)
Pt here via [facility] from Cardiologist for eval of SHOB- approx 40x/min, shallow, EMS noted ET around 33. Pt states episode of SHOB started last night, but has felt SHOB x32mo. States he feels the same when in fluid overload.  Hx kidney & liver transplant, gallbladder removed, CABG, afib on coumadin  R arm restricted

## 2021-06-23 NOTE — H&P (Addendum)
HISTORY AND PHYSICAL  Admit 06/23/21  This is copy of Dr. Olin Pia note   Patient Care Team: Spry, Marsh Dolly., MD as PCP - General (Family Medicine) Deboraha Sprang, MD as Consulting Physician (Cardiology) Jamal Maes, MD as Consulting Physician (Nephrology)    CC; SOB  HPI   [name] is a 70 y.o. male seen in followup for permanent atrial fibrillation in the context of CAD with prior CABG and EF 41%;  he has a history of kidney and liver transplant 2008 and is followed at [facility] He was here a few weeks ago and was short of breath and was hospitalized.  He was seen by [facility] cardiology.  Echo is as below.  Medications were adjusted.  Because of post discharge hypotension metoprolol and losartan were discontinued  They also stopped his metolazone which he resumed on his own because of worsening dyspnea.     Records and Results Reviewed -HOLTER  6/16  HR excursion quite broad and carvidolol doubled DATE TEST EF    1/16 Myoview  41   anterolateral and inferior scars   9/16 echo  45-50 Reduced RVSF/Mod LAE   2/18 Echo  40-45    11/18 Echo  55-65%    9/19 Echo  35-40%    10/19 LHC   LIMA  SVG R SVG PD patent SVG OM occluded  1/20 Echo  35-40% LVH 13-67mm  6/22 Echo (CE)  45%             Hospitalized 9/19 with ventricular tachycardia.  He was seen in consult by Dr. Greggory Brandy.  He has previously had been 55-60% and beta-blockers were initiated.  With repeat echo demonstrating EF 40% or so it was elected to put him on amiodarone.      Patient denies symptoms of GI intolerance, sun sensitivity, neurological symptoms attributable to amiodarone.    Date Cr K Hgb TSH LFTs  1/20 1.75 3.7        8/20 1.52 3.8     14  3/21 2.02 3.3 14.1      7/22 (CE) 1.9 3.6 13.1            The patient is a DNR.  He has "no quality of life "with his multiple medical problems, while acknowledging that he is depressed, is having been treated for many years, he is ready to die.   He does not  want to have VT again No intercurrent VT  Tolerating amio Patient denies symptoms of GI intolerance, sun sensitivity, neurological symptoms attributable to amiodarone.   Surveillance labs necessary    No chest pain, chronic stable sob, mild edema       Past Medical History:  Diagnosis Date   Atrial fibrillation ([place]) 08/2009   CAD (coronary artery disease)      s/p CABG -- 1992   ESRD (end stage renal disease) ([place])     HLD (hyperlipidemia)     HTN (hypertension)     Idiopathic thrombocytopenia ([place])             Past Surgical History:  Procedure Laterality Date   APPENDECTOMY       CORONARY ARTERY BYPASS GRAFT       FACIAL COSMETIC SURGERY       GALLBLADDER SURGERY       HERNIA REPAIR       KIDNEY TRANSPLANT       LEFT HEART CATH AND CORONARY ANGIOGRAPHY N/A 08/07/2018    Procedure: LEFT  HEART CATH AND CORONARY ANGIOGRAPHY;  Surgeon: Sherren Mocha, MD;  Location[facility];  Service: Cardiovascular;  Laterality: N/A;   LIVER TRANSPLANT                Current Outpatient Medications  Medication Sig Dispense Refill   acetaminophen (TYLENOL) 500 MG tablet Take 1,000 mg by mouth every 6 (six) hours as needed for mild pain or headache.        albuterol (PROVENTIL) (2.5 MG/3ML) 0.083% nebulizer solution Take 2.5 mg by nebulization every 2 (two) hours as needed for wheezing or shortness of breath.       allopurinol (ZYLOPRIM) 100 MG tablet Take 100 mg by mouth daily.       amiodarone (PACERONE) 200 MG tablet TAKE 1 TABLET BY MOUTH EVERY DAY 30 tablet 5   atorvastatin (LIPITOR) 20 MG tablet Take 20 mg by mouth at bedtime.        calcitRIOL (ROCALTROL) 0.25 MCG capsule Take 1 capsule by mouth daily.        Cholecalciferol (VITAMIN D3) 25 MCG (1000 UT) CAPS Take 1,000 Units by mouth daily.        Cyanocobalamin (VITAMIN B12) 500 MCG TABS Take 500 mcg by mouth daily.        DULoxetine (CYMBALTA) 60 MG capsule Take 60 mg by mouth at bedtime.       EPINEPHrine (EPI-PEN) 0.3  mg/0.3 mL DEVI Inject 0.3 mg into the muscle once as needed (severe allergic reaction).       ferrous sulfate 325 (65 FE) MG tablet Take 325 mg by mouth daily with breakfast.       fish oil-omega-3 fatty acids 1000 MG capsule Take 2 g by mouth daily.       fluticasone (FLONASE) 50 MCG/ACT nasal spray Place 2 sprays into both nostrils daily as needed for allergies or rhinitis.        furosemide (LASIX) 40 MG tablet Take 40 mg by mouth 2 (two) times daily.       MAGNESIUM-OXIDE 400 (240 Mg) MG tablet Take 1 tablet by mouth 2 (two) times daily.       Melatonin 1 MG TABS Take 1 mg by mouth at bedtime.       metolazone (ZAROXOLYN) 5 MG tablet Take 0.5 tablets by mouth once a week.       midodrine (PROAMATINE) 5 MG tablet Take 5 mg by mouth 3 (three) times daily.       nitroGLYCERIN (NITROSTAT) 0.4 MG SL tablet Place 0.4 mg under the tongue every 5 (five) minutes as needed for chest pain.       potassium chloride SA (KLOR-CON) 20 MEQ tablet Take 2 tablets by mouth 2 (two) times daily. Pt is to take 67mEq K on days he takes Metolazone.        predniSONE (DELTASONE) 5 MG tablet Take 1 tablet (5 mg total) by mouth daily.       QUEtiapine (SEROQUEL) 50 MG tablet Take 3 tablets by mouth at bedtime.       tacrolimus (PROGRAF) 0.5 MG capsule Take 0.5 mg by mouth 2 (two) times daily.        traZODone (DESYREL) 150 MG tablet Take 150 mg by mouth at bedtime as needed for sleep.        warfarin (COUMADIN) 4 MG tablet TAKE AS DIRECTED BY COUMADIN CLINIC 65 tablet 1   carvedilol (COREG) 6.25 MG tablet TAKE 1 TABLET BY MOUTH IN THE MORNING AND 2  TABLETS AT NIGHT (Patient not taking: Reported on 06/23/2021) 270 tablet 2   losartan (COZAAR) 25 MG tablet Take 1 tablet by mouth daily. (Patient not taking: Reported on 06/23/2021)       metoprolol succinate (TOPROL-XL) 25 MG 24 hr tablet Take by mouth. (Patient not taking: Reported on 06/23/2021)        No current facility-administered medications for this visit.            Allergies  Allergen Reactions   Other Anaphylaxis      Spider venom   Grapefruit Concentrate Other (See Comments)      Pt has been told not to eat grapefruit   Haloperidol Other (See Comments)      Caused the patient to be overly-sedated   Nsaids Other (See Comments)      Cannot take due to liver transplant   Rifampin Other (See Comments)      Caused flushing   Triazolam Other (See Comments)      Caused the patient to be overly-sedated          Review of Systems negative except from HPI and PMH   Physical Exam Pulse 85   Ht 5\' 10"  (1.778 m)   Wt 199 lb 3.2 oz (90.4 kg)   SpO2 92%   BMI 28.58 kg/m   Well developed and nourished in moderate respiratory distress HENT normal Neck supple with JVP-  flat >10  Crackles right lung halfway up Irregular rate and rhythm no murmurs or gallops Abd-soft with active BS No Clubbing cyanosis edema Skin-warm and dry A & Oriented  Grossly normal sensory and motor function       ECG atrial fibrillation at 85 Intervals-/13/43                Assessment and  Plan   Atrial fibrillation permanent   CAD  S/p CABG   Congestive heart failure acute/chronic-systolic/diastolic   Hypotension   High Risk Medication Surveillance Amiodarone   Status post renal transplant\\   Ventricular tachycardia   End of LIfe discussion      Atrial fibrillation is controlled.  On anticoagulation with warfarin   He presents today with severe shortness of breath.  Unable to lie flat.  Respiratory rate is about 30 times a minute. Examination of the right lung.  Neck veins to the jaw.   We will plan to send him to the hospital.  Diuretic will have to be determined following blood work.  May benefit from inotropic support.  Suspect heart failure consultation would be a great thing.   In the past he has been DNR.  This will need to be clarified

## 2021-06-23 NOTE — Progress Notes (Signed)
ANTICOAGULATION CONSULT NOTE - Initial Consult  Pharmacy Consult for warfarin Indication: atrial fibrillation  Allergies  Allergen Reactions   Other Anaphylaxis    Spider venom   Grapefruit Concentrate Other (See Comments)    Pt has been told not to eat grapefruit   Haloperidol Other (See Comments)    Caused the patient to be overly-sedated   Nsaids Other (See Comments)    Cannot take due to liver transplant   Rifampin Other (See Comments)    Caused flushing   Triazolam Other (See Comments)    Caused the patient to be overly-sedated    Patient Measurements:  Vital Signs: Temp: 98.2 F (36.8 C) (08/16 1617) Temp Source: Oral (08/16 1617) BP: 166/77 (08/16 1900) Pulse Rate: 83 (08/16 1900)  Labs: Recent Labs    06/23/21 1707  HGB 13.4  HCT 42.3  PLT 73*  LABPROT 26.6*  INR 2.5*  CREATININE 1.91*  TROPONINIHS 28*    Estimated Creatinine Clearance: 41.9 mL/min (A) (by C-G formula based on SCr of 1.91 mg/dL (H)).   Medical History: Past Medical History:  Diagnosis Date   Atrial fibrillation ([place]) 08/2009   CAD (coronary artery disease)    s/p CABG -- 1992   ESRD (end stage renal disease) ([place])    HLD (hyperlipidemia)    HTN (hypertension)    Idiopathic thrombocytopenia (HCC)     Medications:  (Not in a hospital admission)   Assessment: 83 YOM on coumadin for Afib who presented with shortness of breath. Pharmacy consulted to resume home warfarin therapy. INR on admission is therapeutic at 2.5. Last dose of warfarin was yesterday.   H/H wnl, Plt low at 73k (BL < 100)   PTA warfarin regimen: 4 mg on Tuesdays, 2 mg on all other days.   Goal of Therapy:  INR 2-3 Monitor platelets by anticoagulation protocol: Yes   Plan:  -Warfarin 4 mg x 1 dose today -Monitor daily PT/INR   Albertina Parr, PharmD., BCPS, BCCCP Clinical Pharmacist Please refer to [facility] for unit-specific pharmacist

## 2021-06-23 NOTE — Progress Notes (Signed)
Pt still SOB just rec'd lasix 40 will repeat another 40. No chest pain.  Pt is warm and dry  Lungs with rales throughout Heart fairly regular. Abd is full. Rt lower arm with AV graft with + thrill.  Legs no edema.   Hold midodrine, he does not remember taking and BP is strong. He would like Nephrology notified in AM  Dr. Delia Chimes May need AHF in AM No inotropes currently - will eval with lasix first  Discussed DNR pt is tired and does not want to be resuscitated. We will use BiPAP if needed and medication.  Hopefully with IV lasix this will improve.   Echo in June 2022 with EF 45%.

## 2021-06-24 ENCOUNTER — Inpatient Hospital Stay (HOSPITAL_COMMUNITY): Payer: Medicare Other

## 2021-06-24 ENCOUNTER — Other Ambulatory Visit (HOSPITAL_COMMUNITY): Payer: Self-pay

## 2021-06-24 ENCOUNTER — Telehealth (HOSPITAL_COMMUNITY): Payer: Self-pay | Admitting: Pharmacy Technician

## 2021-06-24 DIAGNOSIS — N1832 Chronic kidney disease, stage 3b: Secondary | ICD-10-CM

## 2021-06-24 DIAGNOSIS — I25810 Atherosclerosis of coronary artery bypass graft(s) without angina pectoris: Secondary | ICD-10-CM | POA: Diagnosis not present

## 2021-06-24 DIAGNOSIS — I5023 Acute on chronic systolic (congestive) heart failure: Secondary | ICD-10-CM | POA: Diagnosis not present

## 2021-06-24 DIAGNOSIS — I4821 Permanent atrial fibrillation: Secondary | ICD-10-CM | POA: Diagnosis not present

## 2021-06-24 DIAGNOSIS — Z944 Liver transplant status: Secondary | ICD-10-CM | POA: Diagnosis not present

## 2021-06-24 DIAGNOSIS — Z940 Kidney transplant status: Secondary | ICD-10-CM

## 2021-06-24 LAB — ECHOCARDIOGRAM COMPLETE
AR max vel: 1.63 cm2
AV Area VTI: 1.81 cm2
AV Area mean vel: 1.83 cm2
AV Mean grad: 7 mmHg
AV Peak grad: 15.2 mmHg
Ao pk vel: 1.95 m/s
Height: 70 in
P 1/2 time: 504 msec
S' Lateral: 3.8 cm
Weight: 3089.6 oz

## 2021-06-24 LAB — BASIC METABOLIC PANEL
Anion gap: 7 (ref 5–15)
BUN: 23 mg/dL (ref 8–23)
CO2: 31 mmol/L (ref 22–32)
Calcium: 9.4 mg/dL (ref 8.9–10.3)
Chloride: 101 mmol/L (ref 98–111)
Creatinine, Ser: 2.03 mg/dL — ABNORMAL HIGH (ref 0.61–1.24)
GFR, Estimated: 35 mL/min — ABNORMAL LOW (ref >60)
Glucose, Bld: 117 mg/dL — ABNORMAL HIGH (ref 70–99)
Potassium: 3.4 mmol/L — ABNORMAL LOW (ref 3.5–5.1)
Sodium: 139 mmol/L (ref 135–145)

## 2021-06-24 LAB — HIV ANTIBODY (ROUTINE TESTING W REFLEX): HIV Screen 4th Generation wRfx: NONREACTIVE

## 2021-06-24 LAB — MAGNESIUM: Magnesium: 2 mg/dL (ref 1.7–2.4)

## 2021-06-24 LAB — PROTIME-INR
INR: 2.3 — ABNORMAL HIGH (ref 0.8–1.2)
Prothrombin Time: 25.1 seconds — ABNORMAL HIGH (ref 11.4–15.2)

## 2021-06-24 LAB — TSH: TSH: 1.789 u[IU]/mL (ref 0.350–4.500)

## 2021-06-24 MED ORDER — SODIUM CHLORIDE 0.9 % IV SOLN: 250.0000 mL | INTRAVENOUS | Status: DC | PRN

## 2021-06-24 MED ORDER — SODIUM CHLORIDE 0.9% FLUSH: 3.0000 mL | INTRAVENOUS | Status: DC | PRN

## 2021-06-24 MED ORDER — WARFARIN SODIUM 2 MG PO TABS: 4.0000 mg | ORAL_TABLET | ORAL | Status: DC

## 2021-06-24 MED ORDER — POTASSIUM CHLORIDE CRYS ER 20 MEQ PO TBCR
40.0000 meq | EXTENDED_RELEASE_TABLET | Freq: Once | ORAL | Status: AC
  Administered 2021-06-24: 40 meq via ORAL
  Filled 2021-06-24: qty 2

## 2021-06-24 MED ORDER — WARFARIN SODIUM 2 MG PO TABS: 2.0000 mg | ORAL_TABLET | ORAL | Status: DC

## 2021-06-24 MED ORDER — SODIUM CHLORIDE 0.9 % IV SOLN: INTRAVENOUS | Status: DC

## 2021-06-24 MED ORDER — SODIUM CHLORIDE 0.9% FLUSH: 3.0000 mL | Freq: Two times a day (BID) | INTRAVENOUS | Status: DC

## 2021-06-24 NOTE — TOC Initial Note (Addendum)
Transition of Care (TOC) - Initial/Assessment Note  Heart Failure   Patient Details  Name: [name] MRN: [id number] Date of Birth: 13-Aug-1951  Transition of Care [facility]) CM/SW Contact:    [name] Phone Number: 06/24/2021, 3:06 PM  Clinical Narrative:                 CSW spoke with [name] and his sister-in-law [name] at bedside and completed a very brief SDOH with the patient who denied having any needs at this time. [name] asked the CSW about help with the HF medications as they can be expensive even with his Medicare copays. CSW to speak with pharmacy and the [facility] about medication. [name] reported that he does have a PCP and he can get to the pharmacy to pick up his medications. CSW provided the patient with the social workers name and position and if anything changes to please reach out so that CSW can provide support.  CSW will continue to follow throughout discharge.  Expected Discharge Plan: Skilled Nursing Facility Barriers to Discharge: Continued Medical Work up   Patient Goals and CMS Choice Patient states their goals for this hospitalization and ongoing recovery are:: wants to do what is recommended to feel better CMS Medicare.gov Compare Post Acute Care list provided to:: Patient Choice offered to / list presented to : Patient  Expected Discharge Plan and Services Expected Discharge Plan: [place] In-house Referral: Clinical Social Work Discharge Planning Services: CM Consult Post Acute Care Choice: [place] arrangements for the past 2 months: [place]                                      Prior Living Arrangements/Services Living arrangements for the past 2 months: Single Family Home Lives with:: Self Patient language and need for interpreter reviewed:: Yes Do you feel safe going back to the place where you live?: Yes      Need for Family Participation in Patient Care: No  (Comment) Care giver support system in place?: No (comment)   Criminal Activity/Legal Involvement Pertinent to Current Situation/Hospitalization: No - Comment as needed  Activities of Daily Living      Permission Sought/Granted Permission sought to share information with : Case Manager, PCP, Family Supports Permission granted to share information with : Yes, Verbal Permission Granted  Share Information with NAME: [name] granted to share info w AGENCY: Home Health, SNF  Permission granted to share info w Relationship: brother, sister in law  Permission granted to share info w Contact Information: [phone]  Emotional Assessment Appearance:: Appears stated age Attitude/Demeanor/Rapport: Engaged Affect (typically observed): Accepting Orientation: : Oriented to Self, Oriented to Place, Oriented to  Time, Oriented to Situation   Psych Involvement: No (comment)  Admission diagnosis:  Acute CHF (congestive heart failure) (HCC) [I50.9] Acute on chronic congestive heart failure, unspecified heart failure type [facility]) [I50.9] Patient Active Problem List   Diagnosis Date Noted   Acute on chronic systolic CHF (congestive heart failure) ([place]) 06/23/2021   CAP (community acquired pneumonia) 08/09/2018   Ventricular tachycardia ([place]) 08/05/2018   Chest pain 12/17/2016   Gout 12/17/2016   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) 12/17/2016   Sepsis ([place]) 12/17/2016   History of liver transplant ([place]) 12/17/2016   History of kidney transplant 12/17/2016   Acute respiratory failure  with hypoxia ([place]) 12/17/2016   CKD (chronic kidney disease), stage III ([place]) 12/17/2016   Idiopathic thrombocytopenia (HCC)    HTN (hypertension)    HLD (hyperlipidemia)    CAD (coronary artery disease)    Long term (current) use of anticoagulants [Z79.01] 11/07/2015   Encounter for therapeutic drug monitoring 11/07/2015   Atrial fibrillation  ([place]) 08/08/2009   PCP:  [name]., MD Pharmacy:   [facility] [address] [place] [address] [place] [address] Phone: [phone] Fax: [phone]     Social Determinants of Health (SDOH) Interventions Food Insecurity Interventions: Intervention Not Indicated Financial Strain Interventions: Other (Comment) (referral to Advanced HF clinic for medication assistance for HF meds) Housing Interventions: Intervention Not Indicated Transportation Interventions: Intervention Not Indicated  Readmission Risk Interventions No flowsheet data found.  [name], MSW, [place] Heart Failure Social Worker

## 2021-06-24 NOTE — Consult Note (Addendum)
Reason for Consult: Management of renal transplant patient Referring Physician: Orma Flaming, MD [facility])  HPI: 70 year old Caucasian man with past medical history significant for congestive heart failure with reduced ejection fraction (EF 35 to 45%), atrial fibrillation on anticoagulation with Coumadin, ITP, cryptogenic cirrhosis and end-stage renal disease status post history of simultaneous liver/kidney transplant in 2008 (renal transplant was his second after earlier 1 from 1997 failed).  He has a baseline chronic kidney disease stage IIIb with creatinine ranging 2.0-2.4 and follows up with Dr. Moshe Cipro at [facility] for this (last visit 04/21/2021).  He was admitted to the hospital for management of acute exacerbation of congestive heart failure after found to have orthopnea and tachypnea.  He denies any chest pain or chest tightness.  Denies any cough, sputum production, wheezing, fever, chills or hemoptysis.  He denies any dysuria, allograft site pain, flank pain, hematuria or frequency.  He denies missing any doses of his immunosuppression.  Past Medical History:  Diagnosis Date   Atrial fibrillation ([place]) 08/2009   CAD (coronary artery disease)    s/p CABG -- 1992   ESRD (end stage renal disease) ([place])    HLD (hyperlipidemia)    HTN (hypertension)    Idiopathic thrombocytopenia (HCC)     Past Surgical History:  Procedure Laterality Date   APPENDECTOMY     CORONARY ARTERY BYPASS GRAFT     FACIAL COSMETIC SURGERY     GALLBLADDER SURGERY     HERNIA REPAIR     KIDNEY TRANSPLANT     LEFT HEART CATH AND CORONARY ANGIOGRAPHY N/A 08/07/2018   Procedure: LEFT HEART CATH AND CORONARY ANGIOGRAPHY;  Surgeon: Sherren Mocha, MD;  Location: [place] [facility];  Service: Cardiovascular;  Laterality: N/A;   LIVER TRANSPLANT      Family History  Problem Relation Age of Onset   Atrial fibrillation Mother    Breast cancer Mother    Renal cancer Father    Hypertension  Brother    Hyperlipidemia Brother    Other Brother        stent    Renal Disease Maternal Grandmother    Stroke Maternal Grandmother    Heart attack Maternal Grandfather    Cancer Paternal Grandmother    Heart failure Paternal Grandfather    Emphysema Paternal Grandfather    Bronchiolitis Paternal Grandfather     Social History:  reports that he has never smoked. He has never used smokeless tobacco. He reports that he does not drink alcohol and does not use drugs.  Allergies:  Allergies  Allergen Reactions   Other Anaphylaxis    Spider venom   Grapefruit Concentrate Other (See Comments)    Pt has been told not to eat grapefruit   Haloperidol Other (See Comments)    Caused the patient to be overly-sedated   Nsaids Other (See Comments)    Cannot take due to liver transplant   Rifampin Other (See Comments)    Caused flushing   Triazolam Other (See Comments)    Caused the patient to be overly-sedated    Medications: I have reviewed the patient's current medications. Scheduled:  allopurinol  100 mg Oral Daily   amiodarone  200 mg Oral Daily   atorvastatin  20 mg Oral QHS   calcitRIOL  0.25 mcg Oral Daily   DULoxetine  60 mg Oral QHS   ferrous sulfate  325 mg Oral Q breakfast   furosemide  80 mg Intravenous BID   magnesium oxide  400 mg Oral  BID   melatonin  1.5 mg Oral QHS   omega-3 acid ethyl esters  2 g Oral Daily   potassium chloride SA  40 mEq Oral BID   predniSONE  5 mg Oral Daily   QUEtiapine  150 mg Oral QHS   sodium chloride flush  3 mL Intravenous Q12H   [START ON 06/25/2021] tacrolimus  0.5 mg Oral QODAY   tacrolimus  1 mg Oral QODAY   vitamin B-12  500 mcg Oral Daily   [START ON 06/30/2021] warfarin  4 mg Oral Once per day on Tue   Warfarin - Pharmacist Dosing Inpatient   Does not apply q1600    BMP Latest Ref Rng & Units 06/24/2021 06/23/2021 03/17/2020  Glucose 70 - 99 mg/dL 117(H) 125(H) 128(H)  BUN 8 - 23 mg/dL 23 24(H) 25  Creatinine 0.61 - 1.24 mg/dL  2.03(H) 1.91(H) 2.10(H)  BUN/Creat Ratio 10 - 24 - - 12  Sodium 135 - 145 mmol/L 139 139 141  Potassium 3.5 - 5.1 mmol/L 3.4(L) 3.8 4.1  Chloride 98 - 111 mmol/L 101 101 97  CO2 22 - 32 mmol/L 31 29 28   Calcium 8.9 - 10.3 mg/dL 9.4 9.5 9.8   CBC Latest Ref Rng & Units 06/23/2021 08/22/2019 10/19/2018  WBC 4.0 - 10.5 K/uL 5.5 5.7 5.1  Hemoglobin 13.0 - 17.0 g/dL 13.4 12.8(L) 10.7(L)  Hematocrit 39.0 - 52.0 % 42.3 38.9 33.1(L)  Platelets 150 - 400 K/uL 73(L) 90(LL) 67(L)     DG Chest Port 1 View  Result Date: 06/23/2021 CLINICAL DATA:  Shortness of breath EXAM: PORTABLE CHEST 1 VIEW COMPARISON:  Chest radiograph 08/29/2020 FINDINGS: The heart is significantly enlarged, similar to the prior study. The mediastinal contours are stable. Median sternotomy wires and mediastinal surgical clips are stable. There are increased interstitial markings bilaterally. There are patchy opacities in the right base with a small right pleural effusion. The left costophrenic angle is cut off; a left pleural effusion cannot be excluded. The left upper lung is well aerated. There is no pneumothorax. The bones are unremarkable. IMPRESSION: 1. Small right pleural effusion with adjacent airspace disease which may reflect atelectasis or pneumonia. Recommend follow-up radiographs in 6-8 weeks to assess for resolution. 2. Cardiomegaly with mild pulmonary interstitial edema. 3. A left pleural effusion cannot be excluded as the costophrenic angle is not included in the field of view. Electronically Signed   By: Valetta Mole M.D.   On: 06/23/2021 16:49   ECHOCARDIOGRAM COMPLETE  Result Date: 06/24/2021    ECHOCARDIOGRAM REPORT   Patient Name:   [name] Date of Exam: 06/24/2021 Medical Rec #:  [id number]          Height:       70.0 in Accession #:    [id number]         Weight:       193.1 lb Date of Birth:  August 02, 1951          BSA:          2.056 m Patient Age:    31 years           BP:           135/108 mmHg Patient  Gender: M                  HR:           77 bpm. Exam Location:  Inpatient Procedure: 2D Echo, Cardiac Doppler, Color Doppler and 3D Echo Indications:  CHF  History:        Patient has prior history of Echocardiogram examinations, most                 recent 08/06/2018.  Sonographer:    Merrie Roof RDCS Referring Phys: [place]  1. Inferoseptal and basal to mid inferior and posterolateral hypokinesis. Left ventricular ejection fraction, by estimation, is 45 to 50%. The left ventricle has mildly decreased function. The left ventricle demonstrates regional wall motion abnormalities (see scoring diagram/findings for description). There is moderate concentric left ventricular hypertrophy. Left ventricular diastolic parameters are indeterminate.  2. Right ventricular systolic function is normal. The right ventricular size is normal. There is moderately elevated pulmonary artery systolic pressure.  3. Left atrial size was severely dilated.  4. The mitral valve is normal in structure. Trivial mitral valve regurgitation. No evidence of mitral stenosis.  5. The aortic valve is tricuspid. Aortic valve regurgitation is mild. No aortic stenosis is present. Aortic regurgitation PHT measures 504 msec. Aortic valve area, by VTI measures 1.81 cm. Aortic valve mean gradient measures 7.0 mmHg. Aortic valve Vmax  measures 1.95 m/s.  6. The inferior vena cava is normal in size with greater than 50% respiratory variability, suggesting right atrial pressure of 3 mmHg. FINDINGS  Left Ventricle: Inferoseptal and basal to mid inferior and posterolateral hypokinesis. Left ventricular ejection fraction, by estimation, is 45 to 50%. The left ventricle has mildly decreased function. The left ventricle demonstrates regional wall motion abnormalities. The left ventricular internal cavity size was normal in size. There is moderate concentric left ventricular hypertrophy. Left ventricular diastolic parameters are  indeterminate. Right Ventricle: The right ventricular size is normal. No increase in right ventricular wall thickness. Right ventricular systolic function is normal. There is moderately elevated pulmonary artery systolic pressure. The tricuspid regurgitant velocity is 3.62 m/s, and with an assumed right atrial pressure of 3 mmHg, the estimated right ventricular systolic pressure is 08.1 mmHg. Left Atrium: Left atrial size was severely dilated. Right Atrium: Right atrial size was normal in size. Pericardium: There is no evidence of pericardial effusion. Mitral Valve: The mitral valve is normal in structure. Trivial mitral valve regurgitation. No evidence of mitral valve stenosis. Tricuspid Valve: The tricuspid valve is normal in structure. Tricuspid valve regurgitation is mild . No evidence of tricuspid stenosis. Aortic Valve: The aortic valve is tricuspid. Aortic valve regurgitation is mild. Aortic regurgitation PHT measures 504 msec. No aortic stenosis is present. Aortic valve mean gradient measures 7.0 mmHg. Aortic valve peak gradient measures 15.2 mmHg. Aortic valve area, by VTI measures 1.81 cm. Pulmonic Valve: The pulmonic valve was normal in structure. Pulmonic valve regurgitation is trivial. No evidence of pulmonic stenosis. Aorta: The aortic root is normal in size and structure. Venous: The inferior vena cava is normal in size with greater than 50% respiratory variability, suggesting right atrial pressure of 3 mmHg. IAS/Shunts: No atrial level shunt detected by color flow Doppler.  LEFT VENTRICLE PLAX 2D LVIDd:         5.80 cm LVIDs:         3.80 cm LV PW:         1.30 cm LV IVS:        1.40 cm LVOT diam:     2.00 cm  3D Volume EF: LV SV:         64       3D EF:        56 % LV SV  Index:   31       LV EDV:       192 ml LVOT Area:     3.14 cm LV ESV:       85 ml                         LV SV:        108 ml RIGHT VENTRICLE RV Basal diam:  3.30 cm RV S prime:     7.07 cm/s TAPSE (M-mode): 1.3 cm LEFT ATRIUM               Index       RIGHT ATRIUM           Index LA diam:        6.10 cm  2.97 cm/m  RA Area:     17.30 cm LA Vol (A2C):   110.0 ml 53.49 ml/m RA Volume:   47.20 ml  22.95 ml/m LA Vol (A4C):   130.0 ml 63.22 ml/m LA Biplane Vol: 130.0 ml 63.22 ml/m  AORTIC VALVE AV Area (Vmax):    1.63 cm AV Area (Vmean):   1.83 cm AV Area (VTI):     1.81 cm AV Vmax:           195.00 cm/s AV Vmean:          119.000 cm/s AV VTI:            0.356 m AV Peak Grad:      15.2 mmHg AV Mean Grad:      7.0 mmHg LVOT Vmax:         101.00 cm/s LVOT Vmean:        69.500 cm/s LVOT VTI:          0.205 m LVOT/AV VTI ratio: 0.58 AI PHT:            504 msec  AORTA Ao Root diam: 3.10 cm TRICUSPID VALVE TR Peak grad:   52.4 mmHg TR Vmax:        362.00 cm/s  SHUNTS Systemic VTI:  0.20 m Systemic Diam: 2.00 cm Skeet Latch MD Electronically signed by Skeet Latch MD Signature Date/Time: 06/24/2021/12:24:55 PM    Final     Review of Systems  Constitutional:  Positive for activity change and fatigue. Negative for appetite change, chills and fever.  HENT:  Negative for nosebleeds, sinus pressure, sinus pain and sore throat.   Eyes:  Negative for photophobia, redness and visual disturbance.  Respiratory:  Positive for shortness of breath. Negative for cough, chest tightness and wheezing.   Cardiovascular:  Negative for chest pain and leg swelling.  Gastrointestinal:  Negative for abdominal distention, abdominal pain, diarrhea, nausea and vomiting.  Endocrine: Negative for polydipsia and polyuria.  Genitourinary:  Negative for dysuria, flank pain, hematuria and urgency.  Musculoskeletal:  Negative for back pain, joint swelling and myalgias.  Skin:  Negative for rash and wound.  Neurological:  Positive for weakness. Negative for light-headedness and headaches.  Blood pressure (!) 109/53, pulse 78, temperature 98.3 F (36.8 C), temperature source Oral, resp. rate (!) 27, height 5\' 10"  (1.778 m), weight 87.6 kg, SpO2 92  %. Physical Exam Vitals and nursing note reviewed.  Constitutional:      General: He is not in acute distress.    Appearance: He is well-developed. He is not ill-appearing.  HENT:     Head: Normocephalic and atraumatic.     Mouth/Throat:  Mouth: Mucous membranes are moist.     Pharynx: Oropharynx is clear.  Eyes:     Extraocular Movements: Extraocular movements intact.  Neck:     Vascular: JVD present.     Trachea: No tracheal deviation.     Comments: 10 to 11 cm JVP Cardiovascular:     Rate and Rhythm: Normal rate and regular rhythm.     Heart sounds: Normal heart sounds.    No gallop.  Pulmonary:     Effort: Pulmonary effort is normal.     Breath sounds: Examination of the right-lower field reveals rales. Examination of the left-lower field reveals decreased breath sounds. Decreased breath sounds and rales present. No wheezing or rhonchi.  Chest:     Chest wall: No edema.  Abdominal:     General: Bowel sounds are normal.     Palpations: Abdomen is soft.     Comments: Generalized mild distention with surgical scars  Musculoskeletal:        General: Normal range of motion.     Cervical back: Normal range of motion and neck supple.     Right lower leg: No edema.     Left lower leg: No edema.  Skin:    General: Skin is warm and dry.     Coloration: Skin is pale.  Neurological:     Mental Status: He is alert and oriented to person, place, and time.  Psychiatric:        Mood and Affect: Mood normal.    Assessment/Plan: 1.  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  He previously was on mycophenolate that was discontinued by the transplant service.  He has not had any missed doses of his immunosuppressive medications and renal allograft function appears to be at  baseline.  We will continue to follow him as he undergoes diuresis for management of acute exacerbation of congestive heart failure while tracking his electrolytes for potential replacement.  I will check a tacrolimus trough level tomorrow morning (although recognize that this is notoriously delayed in its reporting to have any meaningful impact on adjusting immunosuppressive therapy). 2.  Acute exacerbation of congestive heart failure: With a history of congestive heart failure with reduced ejection fraction.  Ongoing evaluation for cardiac amyloid and currently off carvedilol and losartan while undergoing diuresis.  He has been net negative from a fluid balance overnight and reports some symptomatic improvement of his dyspnea. 3.  Hypokalemia: Mild and secondary to losses with ongoing diuretic therapy and ongoing oral replacement which we will modify as indicated. 4.  Hypertension: Blood pressure under decent control, continue to monitor with ongoing diuresis. 5.  History of orthotopic liver transplant (simultaneous liver/kidney in 2008): Maintenance immunosuppression with tacrolimus and prednisone. 6.  Permanent atrial fibrillation: Currently rate controlled and on amiodarone secondary to a history of ventricular tachycardia  Kandon Hosking K. 06/24/2021, 3:59 PM

## 2021-06-24 NOTE — H&P (View-Only) (Signed)
Advanced Heart Failure Team Consult Note   Primary Physician: [name]., MD PCP-Cardiologist:  Dr. [name]  Reason for Consultation: Acute on chronic systolic HF  HPI:    [name] is seen today for evaluation of acute on chronic systolic HF at the request of Dr. Harrell Gave with Cardiology. 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 [name], depression, chronic ITP. Has been on midodrine in past for hypotension, however; does not recall ever taking the medicine.   He has been a DNR. Reports poor quality of life for several years. Weak and dyspnea with minimal activity.  Had multiple admissions for acute on chronic CHF several years ago but none in the last 2 years until recently.  Patient admitted to [place] at end of June with acute on chronic CHF. Diuresed with IV lasix. Sent home on prior home dose furosemide at 40 mg BID.  Metolazone was stopped.  Echo with EF 45%. Coreg held d/t hypotension and started on Toprol xl at D/C. Not on SGLT2i d/t cost. Initiated on losartan.   Seen in ED 07/21 with lightheadedness and hypotension. No orthostasis. Losartan and metoprolol held.  Has multiple dental caries treated along with several extractions at [facility] 08/02.  He was seen in clinic by Dr. [name] yesterday and appeared volume overloaded. Short of breath with 10# lb weight gain. Had restarted metolazone on his own a week prior (2 doses so far).   Admission recommended for diuresis and possible inotropic support. Sent to ED for evaluation. Creatinine 1.90 (baseline 1.9-low 2s), Hgb okay, platelets 73 (chronic), BNP 186 (no recent value for comparison), HS trop 27>28. Negative for COVID. ECG with afib and controlled rate. Chest x-ray consistent with CHF and small right effusion  with adjacent airspace disease which may be due to atelectasis or pneumonia. He was given 40 mg lasix IV X2 and admitted to cardiology service for management of acute on chronic CHF. Midodrine held since blood pressure okay. Remained volume up and started on 80 mg lasix IV BID this am.  Echo today with EF 45-50%, multiple WMA, RV okay, PASP 55 mmHg, LA severely dilated, mild TR, trial MR.  Lives alone. ADLs difficult due to dyspnea and fatigue.   Does not use tobacco or consume alcohol.  Review of Systems: [y] = yes, [ ]  = no   General: Weight gain [Y]; Weight loss [ ] ; Anorexia [ ] ; Fatigue [Y]; Fever [ ] ; Chills [ ] ; Weakness [Y]  Cardiac: Chest pain/pressure [ ] ; Resting SOB [ ] ; Exertional SOB [Y]; Orthopnea [Y]; Pedal Edema [Y]; Palpitations [ ] ; Syncope [ ] ; Presyncope [ ] ; Paroxysmal nocturnal dyspnea[ ]   Pulmonary: Cough [ ] ; Wheezing[ ] ; Hemoptysis[ ] ; Sputum [ ] ; Snoring [ ]   GI: Vomiting[ ] ; Dysphagia[ ] ; Melena[ ] ; Hematochezia [ ] ; Heartburn[ ] ; Abdominal pain [ ] ; Constipation [ ] ; Diarrhea [ ] ; BRBPR [ ]   GU: Hematuria[ ] ; Dysuria [ ] ; Nocturia[ ]   Vascular: Pain in legs with walking [ ] ; Pain in feet with lying flat [ ] ; Non-healing sores [ ] ; Stroke [ ] ; TIA [ ] ; Slurred speech [ ] ;  Neuro: Headaches[ ] ; Vertigo[ ] ; Seizures[ ] ; Paresthesias[ ] ;Blurred vision [ ] ; Diplopia [ ] ; Vision changes [ ]   Ortho/Skin: Arthritis [ ] ;  Joint pain [ ] ; Muscle pain [ ] ; Joint swelling [ ] ; Back Pain [ ] ; Rash [ ]   Psych: Depression[Y]; Anxiety[ ]   Heme: Bleeding problems [ ] ; Clotting disorders [ ] ; Anemia [ ]   Endocrine: Diabetes [ ] ; Thyroid dysfunction[ ]   Home Medications Prior to Admission medications   Medication Sig Start Date End Date Taking? Authorizing Provider  acetaminophen (TYLENOL) 500 MG tablet Take 1,000 mg by mouth every 6 (six) hours as needed for mild pain or headache.    Yes [provider]  allopurinol (ZYLOPRIM) 100 MG tablet Take 100 mg by mouth daily.    Yes [provider]  amiodarone (PACERONE) 200 MG tablet TAKE 1 TABLET BY MOUTH EVERY DAY Patient taking differently: Take 200 mg by mouth daily. 04/27/21  Yes Deboraha Sprang, MD  atorvastatin (LIPITOR) 20 MG tablet Take 20 mg by mouth at bedtime.    Yes [provider]  calcitRIOL (ROCALTROL) 0.25 MCG capsule Take 0.25 mcg by mouth daily.   Yes [provider]  Cholecalciferol (VITAMIN D3) 25 MCG (1000 UT) CAPS Take 1,000 Units by mouth daily.    Yes [provider]  Cyanocobalamin (VITAMIN B12) 500 MCG TABS Take 500 mcg by mouth daily.    Yes [provider]  DULoxetine (CYMBALTA) 60 MG capsule Take 60 mg by mouth at bedtime.   Yes [provider]  EPINEPHrine (EPI-PEN) 0.3 mg/0.3 mL DEVI Inject 0.3 mg into the muscle once as needed (severe allergic reaction).   Yes [provider]  ferrous sulfate 325 (65 FE) MG tablet Take 325 mg by mouth daily with breakfast.   Yes [provider]  fish oil-omega-3 fatty acids 1000 MG capsule Take 2 g by mouth daily.   Yes [provider]  fluticasone (FLONASE) 50 MCG/ACT nasal spray Place 2 sprays into both nostrils daily as needed for allergies or rhinitis.    Yes [provider]  furosemide (LASIX) 40 MG tablet Take 40 mg by mouth 2 (two) times daily.   Yes [provider]  MAGNESIUM-OXIDE 400 (240 Mg) MG tablet Take 400 mg by mouth 2 (two) times daily. 05/04/21  Yes [provider]  melatonin 5 MG TABS Take 5 mg by mouth at bedtime.   Yes [provider]  metolazone (ZAROXOLYN) 5 MG tablet Take 5 mg by mouth See admin instructions. Takes 5 mg twice weekly 09/14/18  Yes [provider]  nitroGLYCERIN (NITROSTAT) 0.4 MG SL tablet Place 0.4 mg under the tongue every 5 (five) minutes as needed for chest pain.   Yes [provider]  potassium chloride SA (KLOR-CON) 20 MEQ tablet Take 2 tablets by mouth 3 (three) times daily.   Yes  [provider]  predniSONE (DELTASONE) 5 MG tablet Take 1 tablet (5 mg total) by mouth daily. 12/29/16  Yes [name], MD  QUEtiapine (SEROQUEL) 50 MG tablet Take 150 mg by mouth at bedtime. 08/13/19  Yes [provider]  tacrolimus (PROGRAF) 0.5 MG capsule Take 0.5 mg by mouth See admin instructions. Takes 1 tablet by mouth one day and 2 tablets next day (1 and 2 tablets on alternate days)   Yes [provider]  traZODone (DESYREL) 150 MG tablet Take 150 mg by mouth at bedtime as needed for sleep.    Yes [provider]  warfarin (COUMADIN) 4 MG tablet TAKE AS DIRECTED BY COUMADIN CLINIC Patient taking differently: Take 2-4 mg by mouth See admin instructions. Takes 4  mg on Tuesdays and 2 mg on all other days 09/19/20  Yes Deboraha Sprang, MD  albuterol (PROVENTIL) (2.5 MG/3ML) 0.083% nebulizer solution Take 2.5 mg by nebulization every 2 (two) hours as needed for wheezing or shortness of breath. Patient not taking: Reported on 06/23/2021    [provider]  carvedilol (COREG) 6.25 MG tablet TAKE 1 TABLET BY MOUTH IN THE MORNING AND 2 TABLETS AT NIGHT Patient not taking: No sig reported 03/11/21   Deboraha Sprang, MD  losartan (COZAAR) 25 MG tablet Take 1 tablet by mouth daily. Patient not taking: No sig reported 05/11/21 05/11/22  [provider]    Past Medical History: Past Medical History:  Diagnosis Date   Atrial fibrillation ([place]) 08/2009   CAD (coronary artery disease)    s/p CABG -- 1992   ESRD (end stage renal disease) ([place])    HLD (hyperlipidemia)    HTN (hypertension)    Idiopathic thrombocytopenia (H[facility])     Past Surgical History: Past Surgical History:  Procedure Laterality Date   APPENDECTOMY     CORONARY ARTERY BYPASS GRAFT     FACIAL COSMETIC SURGERY     GALLBLADDER SURGERY     HERNIA REPAIR     KIDNEY TRANSPLANT     LEFT HEART CATH AND CORONARY ANGIOGRAPHY N/A 08/07/2018   Procedure: LEFT HEART CATH AND CORONARY  ANGIOGRAPHY;  Surgeon: Sherren Mocha, MD;  Location: [place] [facility];  Service: Cardiovascular;  Laterality: N/A;   LIVER TRANSPLANT      Family History: Family History  Problem Relation Age of Onset   Atrial fibrillation Mother    Breast cancer Mother    Renal cancer Father    Hypertension Brother    Hyperlipidemia Brother    Other Brother        stent    Renal Disease Maternal Grandmother    Stroke Maternal Grandmother    Heart attack Maternal Grandfather    Cancer Paternal Grandmother    Heart failure Paternal Grandfather    Emphysema Paternal Grandfather    Bronchiolitis Paternal Grandfather     Social History: Social History   Socioeconomic History   Marital status: Single    Spouse name: Not on file   Number of children: 0   Years of education: Not on file   Highest education level: Not on file  Occupational History   Occupation: retired  Tobacco Use   Smoking status: Never   Smokeless tobacco: Never  Substance and Sexual Activity   Alcohol use: No   Drug use: No   Sexual activity: Not on file  Other Topics Concern   Not on file  Social History Narrative   Not on file   Social Determinants of Health   Financial Resource Strain: Not on file  Food Insecurity: Not on file  Transportation Needs: Not on file  Physical Activity: Not on file  Stress: Not on file  Social Connections: Not on file    Allergies:  Allergies  Allergen Reactions   Other Anaphylaxis    Spider venom   Grapefruit Concentrate Other (See Comments)    Pt has been told not to eat grapefruit   Haloperidol Other (See Comments)    Caused the patient to be overly-sedated   Nsaids Other (See Comments)    Cannot take due to liver transplant   Rifampin Other (See Comments)    Caused flushing   Triazolam Other (See Comments)    Caused the patient to be overly-sedated  Objective:    Vital Signs:   Temp:  [97.6 F (36.4 C)-98.6 F (37 C)] 97.6 F (36.4 C) (08/17  0749) Pulse Rate:  [72-89] 86 (08/16 2030) Resp:  [14-36] 24 (08/16 2300) BP: (115-166)/(51-123) 118/69 (08/16 2300) SpO2:  [80 %-99 %] 99 % (08/17 0200) Weight:  [87.6 kg] 87.6 kg (08/16 2201) Last BM Date: 06/23/21  Weight change: Filed Weights   06/23/21 2201  Weight: 87.6 kg    Intake/Output:   Intake/Output Summary (Last 24 hours) at 06/24/2021 1018 Last data filed at 06/24/2021 0816 Gross per 24 hour  Intake 600 ml  Output 1825 ml  Net -1225 ml      Physical Exam    General:  Chronically ill appearing. On 3L , off upon entering room. HEENT: normal Neck: supple. + JVD. Carotids 2+ bilat; no bruits. No lymphadenopathy or thyromegaly appreciated. Cor: PMI nondisplaced. Regular rate & rhythm. No rubs, gallops or murmurs. Lungs: Bibasilar crackles Abdomen: soft, nontender, + distended. No hepatosplenomegaly. No bruits or masses. Good bowel sounds. Extremities: no cyanosis, clubbing, rash, edema. RUE AV fistula Neuro: alert & orientedx3, cranial nerves grossly intact. moves all 4 extremities w/o difficulty. Affect pleasant   Telemetry   Afib, 70s-80s (personally reviewed)  EKG    Afib with rate 76 bpm, IVCD with QRS 131 ms  Labs   Basic Metabolic Panel: Recent Labs  Lab 06/23/21 1707 06/24/21 0255  NA 139 139  K 3.8 3.4*  CL 101 101  CO2 29 31  GLUCOSE 125* 117*  BUN 24* 23  CREATININE 1.91* 2.03*  CALCIUM 9.5 9.4  MG  --  2.0    Liver Function Tests: Recent Labs  Lab 06/23/21 1707  AST 17  ALT 16  ALKPHOS 62  BILITOT 2.0*  PROT 7.0  ALBUMIN 3.5   No results for input(s): LIPASE, AMYLASE in the last 168 hours. No results for input(s): AMMONIA in the last 168 hours.  CBC: Recent Labs  Lab 06/23/21 1707  WBC 5.5  NEUTROABS 4.4  HGB 13.4  HCT 42.3  MCV 89.2  PLT 73*    Cardiac Enzymes: No results for input(s): CKTOTAL, CKMB, CKMBINDEX, TROPONINI in the last 168 hours.  BNP: BNP (last 3 results) Recent Labs    06/23/21 1707   BNP 186.0*    ProBNP (last 3 results) No results for input(s): PROBNP in the last 8760 hours.   CBG: No results for input(s): GLUCAP in the last 168 hours.  Coagulation Studies: Recent Labs    06/23/21 1707 06/24/21 0255  LABPROT 26.6* 25.1*  INR 2.5* 2.3*     Imaging   DG Chest Port 1 View  Result Date: 06/23/2021 CLINICAL DATA:  Shortness of breath EXAM: PORTABLE CHEST 1 VIEW COMPARISON:  Chest radiograph 08/29/2020 FINDINGS: The heart is significantly enlarged, similar to the prior study. The mediastinal contours are stable. Median sternotomy wires and mediastinal surgical clips are stable. There are increased interstitial markings bilaterally. There are patchy opacities in the right base with a small right pleural effusion. The left costophrenic angle is cut off; a left pleural effusion cannot be excluded. The left upper lung is well aerated. There is no pneumothorax. The bones are unremarkable. IMPRESSION: 1. Small right pleural effusion with adjacent airspace disease which may reflect atelectasis or pneumonia. Recommend follow-up radiographs in 6-8 weeks to assess for resolution. 2. Cardiomegaly with mild pulmonary interstitial edema. 3. A left pleural effusion cannot be excluded as the costophrenic angle is not included in  the field of view. Electronically Signed   By: Valetta Mole M.D.   On: 06/23/2021 16:49     Medications:     Current Medications:  allopurinol  100 mg Oral Daily   amiodarone  200 mg Oral Daily   aspirin EC  81 mg Oral Daily   atorvastatin  20 mg Oral QHS   calcitRIOL  0.25 mcg Oral Daily   DULoxetine  60 mg Oral QHS   ferrous sulfate  325 mg Oral Q breakfast   furosemide  80 mg Intravenous BID   magnesium oxide  400 mg Oral BID   melatonin  1.5 mg Oral QHS   omega-3 acid ethyl esters  2 g Oral Daily   pneumococcal 23 valent vaccine  0.5 mL Intramuscular Tomorrow-1000   potassium chloride SA  40 mEq Oral BID   predniSONE  5 mg Oral Daily    QUEtiapine  150 mg Oral QHS   sodium chloride flush  3 mL Intravenous Q12H   [START ON 06/25/2021] tacrolimus  0.5 mg Oral QODAY   tacrolimus  1 mg Oral QODAY   vitamin B-12  500 mcg Oral Daily   warfarin  2 mg Oral Once per day on Sun Mon Wed Thu Fri Sat   [START ON 06/30/2021] warfarin  4 mg Oral Once per day on Tue   Warfarin - Pharmacist Dosing Inpatient   Does not apply q1600    Infusions:  sodium chloride         Assessment/Plan  Acute on chronic systolic HF/ICM: -EF variable over years, EF 45-50% 2016, 55-65% 2018, 35-40% in 2019 and 2020, 45% on echo at [facility] June 2022. EF 45-50% on echo this admit. WMA. Moderate LVH. RV okay. -RHC tomorrow to better assess  -Need to r/o cardiac amyloid. SPEP/UPEP with immunofixation. PYP scan this admit. -Admission to [place] in June with a/c CHF. D/C weight 189 lb. Multiple med changes recently. Now off coreg and losartan.  -Admit 08/16 with a/c CHF. Weight 199 lb at clinic f/u.  -Has received 40 mg lasix IV X 2 and 80 mg lasix IV this am. Diuresing well with IV lasix. Still appears overloaded. Continue IV lasix 80 mg BID -GDMT limited by hypotension and CKD. Records indicate he had been on midodrine at one point but he does not recall this. BP okay here.  -Cost previously limited use of [place]. Will review options with our HF Pharm D.   2. Pulmonary hypertension: -PASP 55 mmHg on echo this admit -RV okay -? Untreated sleep apnea  2. Permanent atrial fibrillation: -Rate controlled. Not on beta blocker d/t hypotension. On amio d/t VT. TSH okay. -Severe LAE noted on echo -Reports multiple cardioversions over the years, believes he has been in afib for over 10 years -Anticoagulated with Warfarin. INR 2.3. Will hold tonight for RHC. Access via right femoral, has right UE AV fistula, had left UE AV fistula in the past  3. CAD: -Hx CABG in '92 -Last LHC in 2019 in setting of VT with patent LIMA to LAD, patent SVG to PDA, moderate stenosis SVG  to ramus, occluded SVG to OM1 (medical therapy suggested d/t comorbidities and thrombocytopenia with need for anticoagulation) -Denies CP -Very mild troponin elevation on admit with flat trend. Likely d/t demand ischemia from a/c CHF. -Not on asa d/t history ITP and chronically anticoagulated. On statin.  4. Hx VT: -On amiodarone 200 daily -No VT so far on tele  5. Hx renal and liver transplant in  2008: -Failed renal transplant '97 -On prednisone, tacrolimus -Followed at [facility] for liver transplant, reports Dr. Moshe Cipro for renal   6. Stage IIIb CKD -Hx renal transplant as above -Monitor with diuresis -Scr 2.03 (baseline 1.9-2.1) -Avoid hypotension  7. Depression -Appears to be an ongoing issue   [facility] CM/CSW consult PT/OT consult - suspect he may need SNF for rehab   GOC: -Has been DNR -Reports poor quality of life over the last few years -May consider palliative care consult  Length of Stay: 1  FINCH, LINDSAY N, PA-C  06/24/2021, 10:18 AM  Advanced Heart Failure Team Pager [phone] (M-F; 7a - 5p)  Please contact [facility] for night-coverage after hours (4p -7a ) and weekends on [email or web address]   Patient seen with PA, agree with the above note.   Past history as outlined above.  Patient was admitted from Dr. Olin Pia clinic with acute on chronic HF with mid range EF.  Creatinine 2 which is about his baseline.   General: NAD Neck: JVP 14-16 cm, no thyromegaly or thyroid nodule.  Lungs: Clear to auscultation bilaterally with normal respiratory effort. CV: Nondisplaced PMI.  Heart regular S1/S2, no S3/S4, 1/6 SEM RUSB.  No peripheral edema.  No carotid bruit.  Normal pedal pulses.  Abdomen: Soft, nontender, no hepatosplenomegaly, no distention.  Skin: Intact without lesions or rashes.  Neurologic: Alert and oriented x 3.  Psych: Normal affect. Extremities: No clubbing or cyanosis.  HEENT: Normal.   1. Acute on chronic HF with mid range EF: Ischemic cardiomyopathy.   Echo this admission with EF 45-50%, moderate LVH, RV normal, PASP 55 mmHg. Recent admission in 6/22 with CHF.  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  Also concern for possible cardiac amyloidosis with LVH.  He is volume overloaded on exam.  Med titration has been limited by hypotension.  - Will hold warfarin today and plan RHC tomorrow (probably will not do hemodynamic LHC unless INR comes down).  Discussed risks/benefits with patient and he agrees to procedure.  - Will arrange for PYP scan and myeloma panel/urine immunofixation for amyloidosis workup.  - With CKD, will try to get him on SGLT2 inhibitor eventually.  - Lasix 80 mg IV bid and follow creatinine closely.  2. CKD: Stage 3b.  Creatinine 2 today which seems to be baseline.  History of renal transplantation.  - On tacrolimus and prednisone.  - Follow creatinine closely with diuresis.  3. VT: Patient has history of VT, maintained on amiodarone.  4. Atrial fibrillation: Permanent x years.  Unlikely to be able to regain NSR for any significant period of time.  - Continue warfarin (holding for cath tomorrow).  5. CAD: s/p CABG 1992.  Last LHC in 2019 in setting of VT with patent LIMA to LAD, patent SVG to PDA, moderate stenosis SVG to ramus, occluded SVG to OM1 (medical therapy suggested d/t comorbidities and thrombocytopenia with need for anticoagulation).  No chest pain.  Mildly elevated HS-TnI likely due to demand ischemia from volume overload.  - Continue statin.  - On warfarin so ASA not needed.  6. S/p liver transplant: followed at [facility].  7. Depression  Loralie Champagne 06/24/2021 3:14 PM

## 2021-06-24 NOTE — Consult Note (Signed)
Medical Consultation   [name]  OYD:[id number]  DOB: 04/09/1951  DOA: 06/23/2021  PCP: [name]., MD   Outpatient Specialists: cardiology: Dr. [name] and also seen at [facility]: Dr. Lutricia Feil Nephrology: Dr. Lorrene Reid, [facility]: Dr. Dorris Fetch    Requesting physician: Dr. Katsadouros/Dr. Harrell Gave: cardiology   Reason for consultation: Chronic medical issues     History of Present Illness: [name] is an 70 y.o. male with history of chronic HFrEF, atrial fibrillation on warfarin, hx of liver/kidney transplant on tacrolimus/prednisone who was seen at his cardiologist office and found to be short of breath with orthopnea and tachypnea and was subsequently admitted onto the cardiologist service for acute heart failure exacerbation. We were asked to consult for chronic medical conditions, more specifically CKD and his immunosuppressant drugs. He is resting in bed and short on conversation. He tells me he was sent over from his cardiologist's office due to shortness of breath. He has no other pertinent history except that he got some teeth pulled a few weeks ago.   Review of Systems:  Review of Systems  Constitutional:  Negative for chills and fever.  HENT:  Negative for congestion and sore throat.   Respiratory:  Positive for shortness of breath. Negative for cough, sputum production and wheezing.   Cardiovascular:  Positive for orthopnea. Negative for chest pain, palpitations and leg swelling.  Gastrointestinal:  Negative for abdominal pain, diarrhea, nausea and vomiting.  Genitourinary:  Negative for dysuria, flank pain, frequency and urgency.  Musculoskeletal:  Negative for myalgias.     Past Medical History: Past Medical History:  Diagnosis Date   Atrial fibrillation ([place]) 08/2009   CAD (coronary artery disease)    s/p CABG -- 1992   ESRD (end stage renal disease) ([place])    HLD (hyperlipidemia)    HTN (hypertension)    Idiopathic  thrombocytopenia (HCC)     Past Surgical History: Past Surgical History:  Procedure Laterality Date   APPENDECTOMY     CORONARY ARTERY BYPASS GRAFT     FACIAL COSMETIC SURGERY     GALLBLADDER SURGERY     HERNIA REPAIR     KIDNEY TRANSPLANT     LEFT HEART CATH AND CORONARY ANGIOGRAPHY N/A 08/07/2018   Procedure: LEFT HEART CATH AND CORONARY ANGIOGRAPHY;  Surgeon: Sherren Mocha, MD;  Location: [facility];  Service: Cardiovascular;  Laterality: N/A;   LIVER TRANSPLANT       Allergies:   Allergies  Allergen Reactions   Other Anaphylaxis    Spider venom   Grapefruit Concentrate Other (See Comments)    Pt has been told not to eat grapefruit   Haloperidol Other (See Comments)    Caused the patient to be overly-sedated   Nsaids Other (See Comments)    Cannot take due to liver transplant   Rifampin Other (See Comments)    Caused flushing   Triazolam Other (See Comments)    Caused the patient to be overly-sedated     Social History:  reports that he has never smoked. He has never used smokeless tobacco. He reports that he does not drink alcohol and does not use drugs.   Family History: Family History  Problem Relation Age of Onset   Atrial fibrillation Mother    Breast cancer Mother    Renal cancer Father    Hypertension Brother    Hyperlipidemia Brother    Other Brother  stent    Renal Disease Maternal Grandmother    Stroke Maternal Grandmother    Heart attack Maternal Grandfather    Cancer Paternal Grandmother    Heart failure Paternal Grandfather    Emphysema Paternal Grandfather    Bronchiolitis Paternal Grandfather      Physical Exam: Vitals:   06/24/21 1203 06/24/21 1600 06/24/21 1636 06/24/21 1930  BP:  (!) 128/106  126/64  Pulse:  80    Resp:  (!) 22  (!) 22  Temp: 98.3 F (36.8 C)  98.6 F (37 C) 98.6 F (37 C)  TempSrc: Oral  Oral Oral  SpO2:  97%  96%  Weight:      Height:        Constitutional: sleeping, but will wake up  and answer questions.  oriented x3, not in any acute distress. Eyes: PERLA, EOMI, irises appear normal, anicteric sclera,  ENMT: external ears and nose appear normal, normal hearing             Lips appears normal, oropharynx mucosa, tongue, posterior pharynx appear normal  Neck: neck appears normal, no masses, normal ROM, no thyromegaly, no JVD  CVS: S1-S2 clear, no murmur rubs or gallops, no LE edema, normal pedal pulses  Respiratory:  He has bilateral lower lobe velcro like sounds.  no wheezing, rales or rhonchi. Respiratory effort normal. Mildly tachypneic   Abdomen: soft nontender, nondistended, normal bowel sounds, no hepatosplenomegaly, no hernias  Musculoskeletal: : no cyanosis, clubbing or edema noted bilaterally                     Neuro: Cranial nerves II-XII intact, strength, sensation, reflexes Psych: judgement and insight appear normal, stable mood and affect, mental status Skin: no rashes or lesions or ulcers, no induration or nodules   Data reviewed:  I have personally reviewed following labs and imaging studies Labs:  CBC: Recent Labs  Lab 06/23/21 1707  WBC 5.5  NEUTROABS 4.4  HGB 13.4  HCT 42.3  MCV 89.2  PLT 73*    Basic Metabolic Panel: Recent Labs  Lab 06/23/21 1707 06/24/21 0255  NA 139 139  K 3.8 3.4*  CL 101 101  CO2 29 31  GLUCOSE 125* 117*  BUN 24* 23  CREATININE 1.91* 2.03*  CALCIUM 9.5 9.4  MG  --  2.0   GFR Estimated Creatinine Clearance: 38.8 mL/min (A) (by C-G formula based on SCr of 2.03 mg/dL (H)). Liver Function Tests: Recent Labs  Lab 06/23/21 1707  AST 17  ALT 16  ALKPHOS 62  BILITOT 2.0*  PROT 7.0  ALBUMIN 3.5   No results for input(s): LIPASE, AMYLASE in the last 168 hours. No results for input(s): AMMONIA in the last 168 hours. Coagulation profile Recent Labs  Lab 06/20/21 0000 06/23/21 1707 06/24/21 0255  INR 3.4* 2.5* 2.3*    Thyroid function studies Recent Labs    06/24/21 0255  TSH 1.789   Anemia work  up No results for input(s): VITAMINB12, FOLATE, FERRITIN, TIBC, IRON, RETICCTPCT in the last 72 hours. Urinalysis    Component Value Date/Time   COLORURINE STRAW (A) 12/17/2016 1810   APPEARANCEUR CLEAR 12/17/2016 1810   LABSPEC 1.006 12/17/2016 1810   PHURINE 5.0 12/17/2016 1810   GLUCOSEU NEGATIVE 12/17/2016 1810   HGBUR SMALL (A) 12/17/2016 1810   BILIRUBINUR NEGATIVE 12/17/2016 1810   KETONESUR NEGATIVE 12/17/2016 1810   PROTEINUR NEGATIVE 12/17/2016 1810   NITRITE NEGATIVE 12/17/2016 1810   LEUKOCYTESUR NEGATIVE 12/17/2016 1810  Sepsis Labs Invalid input(s): PROCALCITONIN,  WBC,  LACTICIDVEN Microbiology Recent Results (from the past 240 hour(s))  Resp Panel by RT-PCR (Flu A&B, Covid) Nasopharyngeal Swab     Status: None   Collection Time: 06/23/21  5:12 PM   Specimen: Nasopharyngeal Swab; Nasopharyngeal(NP) swabs in vial transport medium  Result Value Ref Range Status   SARS Coronavirus 2 by RT PCR NEGATIVE NEGATIVE Final    Comment: (NOTE) SARS-CoV-2 target nucleic acids are NOT DETECTED.  The SARS-CoV-2 RNA is generally detectable in upper respiratory specimens during the acute phase of infection. The lowest concentration of SARS-CoV-2 viral copies this assay can detect is 138 copies/mL. A negative result does not preclude SARS-Cov-2 infection and should not be used as the sole basis for treatment or other patient management decisions. A negative result may occur with  improper specimen collection/handling, submission of specimen other than nasopharyngeal swab, presence of viral mutation(s) within the areas targeted by this assay, and inadequate number of viral copies(<138 copies/mL). A negative result must be combined with clinical observations, patient history, and epidemiological information. The expected result is Negative.  Fact Sheet for Patients:  EntrepreneurPulse.com.au  Fact Sheet for Healthcare Providers:   IncredibleEmployment.be  This test is no t yet approved or cleared by the [place] FDA and  has been authorized for detection and/or diagnosis of SARS-CoV-2 by FDA under an Emergency Use Authorization (EUA). This EUA will remain  in effect (meaning this test can be used) for the duration of the COVID-19 declaration under Section 564(b)(1) of the Act, 21 U.S.C.section 360bbb-3(b)(1), unless the authorization is terminated  or revoked sooner.       Influenza A by PCR NEGATIVE NEGATIVE Final   Influenza B by PCR NEGATIVE NEGATIVE Final    Comment: (NOTE) The Xpert Xpress SARS-CoV-2/FLU/RSV plus assay is intended as an aid in the diagnosis of influenza from Nasopharyngeal swab specimens and should not be used as a sole basis for treatment. Nasal washings and aspirates are unacceptable for Xpert Xpress SARS-CoV-2/FLU/RSV testing.  Fact Sheet for Patients: EntrepreneurPulse.com.au  Fact Sheet for Healthcare Providers: IncredibleEmployment.be  This test is not yet approved or cleared by the [place] FDA and has been authorized for detection and/or diagnosis of SARS-CoV-2 by FDA under an Emergency Use Authorization (EUA). This EUA will remain in effect (meaning this test can be used) for the duration of the COVID-19 declaration under Section 564(b)(1) of the Act, 21 U.S.C. section 360bbb-3(b)(1), unless the authorization is terminated or revoked.  Performed at [facility], [place] [address] [place], [place] [address]        Inpatient Medications:   Scheduled Meds:  allopurinol  100 mg Oral Daily   amiodarone  200 mg Oral Daily   atorvastatin  20 mg Oral QHS   calcitRIOL  0.25 mcg Oral Daily   DULoxetine  60 mg Oral QHS   ferrous sulfate  325 mg Oral Q breakfast   furosemide  80 mg Intravenous BID   magnesium oxide  400 mg Oral BID   melatonin  1.5 mg Oral QHS   omega-3 acid ethyl esters  2 g Oral Daily    potassium chloride SA  40 mEq Oral BID   predniSONE  5 mg Oral Daily   QUEtiapine  150 mg Oral QHS   sodium chloride flush  3 mL Intravenous Q12H   sodium chloride flush  3 mL Intravenous Q12H   [START ON 06/25/2021] tacrolimus  0.5 mg Oral QODAY  tacrolimus  1 mg Oral QODAY   vitamin B-12  500 mcg Oral Daily   [START ON 06/30/2021] warfarin  4 mg Oral Once per day on Tue   Warfarin - Pharmacist Dosing Inpatient   Does not apply q1600   Continuous Infusions:  sodium chloride     sodium chloride     [START ON 06/25/2021] sodium chloride       Radiological Exams on Admission: DG Chest Port 1 View  Result Date: 06/23/2021 CLINICAL DATA:  Shortness of breath EXAM: PORTABLE CHEST 1 VIEW COMPARISON:  Chest radiograph 08/29/2020 FINDINGS: The heart is significantly enlarged, similar to the prior study. The mediastinal contours are stable. Median sternotomy wires and mediastinal surgical clips are stable. There are increased interstitial markings bilaterally. There are patchy opacities in the right base with a small right pleural effusion. The left costophrenic angle is cut off; a left pleural effusion cannot be excluded. The left upper lung is well aerated. There is no pneumothorax. The bones are unremarkable. IMPRESSION: 1. Small right pleural effusion with adjacent airspace disease which may reflect atelectasis or pneumonia. Recommend follow-up radiographs in 6-8 weeks to assess for resolution. 2. Cardiomegaly with mild pulmonary interstitial edema. 3. A left pleural effusion cannot be excluded as the costophrenic angle is not included in the field of view. Electronically Signed   By: Valetta Mole M.D.   On: 06/23/2021 16:49   ECHOCARDIOGRAM COMPLETE  Result Date: 06/24/2021    ECHOCARDIOGRAM REPORT   Patient Name:   [name] Date of Exam: 06/24/2021 Medical Rec #:  [id number]          Height:       70.0 in Accession #:    [id number]         Weight:       193.1 lb Date of Birth:   09/18/1951          BSA:          2.056 m Patient Age:    89 years           BP:           135/108 mmHg Patient Gender: M                  HR:           77 bpm. Exam Location:  Inpatient Procedure: 2D Echo, Cardiac Doppler, Color Doppler and 3D Echo Indications:    CHF  History:        Patient has prior history of Echocardiogram examinations, most                 recent 08/06/2018.  Sonographer:    Merrie Roof RDCS Referring Phys: Ages  1. Inferoseptal and basal to mid inferior and posterolateral hypokinesis. Left ventricular ejection fraction, by estimation, is 45 to 50%. The left ventricle has mildly decreased function. The left ventricle demonstrates regional wall motion abnormalities (see scoring diagram/findings for description). There is moderate concentric left ventricular hypertrophy. Left ventricular diastolic parameters are indeterminate.  2. Right ventricular systolic function is normal. The right ventricular size is normal. There is moderately elevated pulmonary artery systolic pressure.  3. Left atrial size was severely dilated.  4. The mitral valve is normal in structure. Trivial mitral valve regurgitation. No evidence of mitral stenosis.  5. The aortic valve is tricuspid. Aortic valve regurgitation is mild. No aortic stenosis is present. Aortic regurgitation PHT measures 504 msec. Aortic valve area,  by VTI measures 1.81 cm. Aortic valve mean gradient measures 7.0 mmHg. Aortic valve Vmax  measures 1.95 m/s.  6. The inferior vena cava is normal in size with greater than 50% respiratory variability, suggesting right atrial pressure of 3 mmHg. FINDINGS  Left Ventricle: Inferoseptal and basal to mid inferior and posterolateral hypokinesis. Left ventricular ejection fraction, by estimation, is 45 to 50%. The left ventricle has mildly decreased function. The left ventricle demonstrates regional wall motion abnormalities. The left ventricular internal cavity size was normal in size.  There is moderate concentric left ventricular hypertrophy. Left ventricular diastolic parameters are indeterminate. Right Ventricle: The right ventricular size is normal. No increase in right ventricular wall thickness. Right ventricular systolic function is normal. There is moderately elevated pulmonary artery systolic pressure. The tricuspid regurgitant velocity is 3.62 m/s, and with an assumed right atrial pressure of 3 mmHg, the estimated right ventricular systolic pressure is 66.2 mmHg. Left Atrium: Left atrial size was severely dilated. Right Atrium: Right atrial size was normal in size. Pericardium: There is no evidence of pericardial effusion. Mitral Valve: The mitral valve is normal in structure. Trivial mitral valve regurgitation. No evidence of mitral valve stenosis. Tricuspid Valve: The tricuspid valve is normal in structure. Tricuspid valve regurgitation is mild . No evidence of tricuspid stenosis. Aortic Valve: The aortic valve is tricuspid. Aortic valve regurgitation is mild. Aortic regurgitation PHT measures 504 msec. No aortic stenosis is present. Aortic valve mean gradient measures 7.0 mmHg. Aortic valve peak gradient measures 15.2 mmHg. Aortic valve area, by VTI measures 1.81 cm. Pulmonic Valve: The pulmonic valve was normal in structure. Pulmonic valve regurgitation is trivial. No evidence of pulmonic stenosis. Aorta: The aortic root is normal in size and structure. Venous: The inferior vena cava is normal in size with greater than 50% respiratory variability, suggesting right atrial pressure of 3 mmHg. IAS/Shunts: No atrial level shunt detected by color flow Doppler.  LEFT VENTRICLE PLAX 2D LVIDd:         5.80 cm LVIDs:         3.80 cm LV PW:         1.30 cm LV IVS:        1.40 cm LVOT diam:     2.00 cm  3D Volume EF: LV SV:         64       3D EF:        56 % LV SV Index:   31       LV EDV:       192 ml LVOT Area:     3.14 cm LV ESV:       85 ml                         LV SV:        108 ml  RIGHT VENTRICLE RV Basal diam:  3.30 cm RV S prime:     7.07 cm/s TAPSE (M-mode): 1.3 cm LEFT ATRIUM              Index       RIGHT ATRIUM           Index LA diam:        6.10 cm  2.97 cm/m  RA Area:     17.30 cm LA Vol (A2C):   110.0 ml 53.49 ml/m RA Volume:   47.20 ml  22.95 ml/m LA Vol (A4C):   130.0 ml 63.Osyka  ml/m LA Biplane Vol: 130.0 ml 63.22 ml/m  AORTIC VALVE AV Area (Vmax):    1.63 cm AV Area (Vmean):   1.83 cm AV Area (VTI):     1.81 cm AV Vmax:           195.00 cm/s AV Vmean:          119.000 cm/s AV VTI:            0.356 m AV Peak Grad:      15.2 mmHg AV Mean Grad:      7.0 mmHg LVOT Vmax:         101.00 cm/s LVOT Vmean:        69.500 cm/s LVOT VTI:          0.205 m LVOT/AV VTI ratio: 0.58 AI PHT:            504 msec  AORTA Ao Root diam: 3.10 cm TRICUSPID VALVE TR Peak grad:   52.4 mmHg TR Vmax:        362.00 cm/s  SHUNTS Systemic VTI:  0.20 m Systemic Diam: 2.00 cm Skeet Latch MD Electronically signed by Skeet Latch MD Signature Date/Time: 06/24/2021/12:24:55 PM    Final     Impression/Recommendations Principal Problem:   Acute on chronic systolic CHF (congestive heart failure) (HCC) -Diuresis and management per primary cardiology team -Echocardiogram today with EF 45-50%. LV with mildly decreased function.  -Plan for right heart cath tomorrow  Active Problems:   Atrial fibrillation (HCC) -Rate controlled continue telemetry monitoring -Continue amiodarone -Anticoagulation with warfarin per pharmacy dosing    Acute respiratory failure with hypoxia ([place]) -Patient requiring 2 L of oxygen while I was in the room with satting around 85 to 86% on room air -questionable if secondary to acute on chronic CHF exacerbation -Diuresis per cardiology and will need to continue to follow clinically    Idiopathic thrombocytopenia ([place]) -Appears to be at his baseline Runs in the upper 50s to 90 K -Continue to monitor with daily CBCs with platelet count    HTN (hypertension) -Has  been going through blood pressure medication adjustments at [facility] with cardiologist -management per cardiology    CAD (coronary artery disease) -Stable continue home medication    History of liver transplant ([place]) -Followed at [facility] liver transplant center -Continue tacrolimus and prednisone    History of kidney transplant -Nephrology has been consulted.  Especially in light of diuresis by cardiology -We will continue home prednisone and tacrolimus and follow-up on their recommendations    CKD (chronic kidney disease), stage III ([place]) -Appears stable at his baseline: creatinine (2.0-2.4) -Monitor closely in light of diuresis -nephrology following    HLD (hyperlipidemia) -Continue home Lipitor    Gout -Continue allopurinol -no Evidence of any flares at time of admission  Insomnia -Continue Seroquel and trazodone  Thank you for this consultation.  Our [facility] hospitalist team will follow the patient with you.   Time Spent: 1 minutes   Orma Flaming M.D. [facility] 06/24/2021, 8:32 PM

## 2021-06-24 NOTE — Progress Notes (Signed)
ANTICOAGULATION CONSULT NOTE - Follow Up Consult  Pharmacy Consult for Warfarin Indication: atrial fibrillation  Allergies  Allergen Reactions   Other Anaphylaxis    Spider venom   Grapefruit Concentrate Other (See Comments)    Pt has been told not to eat grapefruit   Haloperidol Other (See Comments)    Caused the patient to be overly-sedated   Nsaids Other (See Comments)    Cannot take due to liver transplant   Rifampin Other (See Comments)    Caused flushing   Triazolam Other (See Comments)    Caused the patient to be overly-sedated    Patient Measurements: Height: 5\' 10"  (177.8 cm) Weight: 87.6 kg (193 lb 1.6 oz) (scale C) IBW/kg (Calculated) : 73  Vital Signs: Temp: 97.7 F (36.5 C) (08/17 0416) Temp Source: Oral (08/17 0416) BP: 118/69 (08/16 2300) Pulse Rate: 86 (08/16 2030)  Labs: Recent Labs    06/23/21 1707 06/23/21 1822 06/24/21 0255  HGB 13.4  --   --   HCT 42.3  --   --   PLT 73*  --   --   LABPROT 26.6*  --  25.1*  INR 2.5*  --  2.3*  CREATININE 1.91*  --  2.03*  TROPONINIHS 28* 27*  --     Estimated Creatinine Clearance: 38.8 mL/min (A) (by C-G formula based on SCr of 2.03 mg/dL (H)).   Assessment:  Anticoag: Warfarin for Afib. H/H wnl, Plt low. INR 2.3.  - PTA dose: 4mg  Tues, 2mg  all other days  Goal of Therapy:  INR 2-3 Monitor platelets by anticoagulation protocol: Yes   Plan:  -Warfarin 4mg  Tues, 2mg  all other days--resume home dosing -Monitor daily PT/INR    Latrese Carolan S. Alford Highland, PharmD, BCPS Clinical Staff Pharmacist [email or web address]  Alford Highland, Joshue Badal Stillinger 06/24/2021,7:34 AM

## 2021-06-24 NOTE — Discharge Instructions (Signed)
Information on my medicine - Coumadin   (Warfarin)  Why was Coumadin prescribed for you? Coumadin was prescribed for you because you have a blood clot or a medical condition that can cause an increased risk of forming blood clots. Blood clots can cause serious health problems by blocking the flow of blood to the heart, lung, or brain. Coumadin can prevent harmful blood clots from forming. As a reminder your indication for Coumadin is:   afib  What test will check on my response to Coumadin? While on Coumadin (warfarin) you will need to have an INR test regularly to ensure that your dose is keeping you in the desired range. The INR (international normalized ratio) number is calculated from the result of the laboratory test called prothrombin time (PT).  If an INR APPOINTMENT HAS NOT ALREADY BEEN MADE FOR YOU please schedule an appointment to have this lab work done by your health care provider within 7 days. Your INR goal is usually a number between:  2 to 3 or your provider may give you a more narrow range like 2-2.5.  Ask your health care provider during an office visit what your goal INR is.  What  do you need to  know  About  COUMADIN? Take Coumadin (warfarin) exactly as prescribed by your healthcare provider about the same time each day.  DO NOT stop taking without talking to the doctor who prescribed the medication.  Stopping without other blood clot prevention medication to take the place of Coumadin may increase your risk of developing a new clot or stroke.  Get refills before you run out.  What do you do if you miss a dose? If you miss a dose, take it as soon as you remember on the same day then continue your regularly scheduled regimen the next day.  Do not take two doses of Coumadin at the same time.  Important Safety Information A possible side effect of Coumadin (Warfarin) is an increased risk of bleeding. You should call your healthcare provider right away if you experience any of  the following: Bleeding from an injury or your nose that does not stop. Unusual colored urine (red or dark brown) or unusual colored stools (red or black). Unusual bruising for unknown reasons. A serious fall or if you hit your head (even if there is no bleeding).  Some foods or medicines interact with Coumadin (warfarin) and might alter your response to warfarin. To help avoid this: Eat a balanced diet, maintaining a consistent amount of Vitamin K. Notify your provider about major diet changes you plan to make. Avoid alcohol or limit your intake to 1 drink for women and 2 drinks for men per day. (1 drink is 5 oz. wine, 12 oz. beer, or 1.5 oz. liquor.)  Make sure that ANY health care provider who prescribes medication for you knows that you are taking Coumadin (warfarin).  Also make sure the healthcare provider who is monitoring your Coumadin knows when you have started a new medication including herbals and non-prescription products.  Coumadin (Warfarin)  Major Drug Interactions  Increased Warfarin Effect Decreased Warfarin Effect  Alcohol (large quantities) Antibiotics (esp. Septra/Bactrim, Flagyl, Cipro) Amiodarone (Cordarone) Aspirin (ASA) Cimetidine (Tagamet) Megestrol (Megace) NSAIDs (ibuprofen, naproxen, etc.) Piroxicam (Feldene) Propafenone (Rythmol SR) Propranolol (Inderal) Isoniazid (INH) Posaconazole (Noxafil) Barbiturates (Phenobarbital) Carbamazepine (Tegretol) Chlordiazepoxide (Librium) Cholestyramine (Questran) Griseofulvin Oral Contraceptives Rifampin Sucralfate (Carafate) Vitamin K   Coumadin (Warfarin) Major Herbal Interactions  Increased Warfarin Effect Decreased Warfarin Effect  Garlic Ginseng Ginkgo  biloba Coenzyme Q10 Green tea St. John's wort    Coumadin (Warfarin) FOOD Interactions  Eat a consistent number of servings per week of foods HIGH in Vitamin K (1 serving =  cup)  Collards (cooked, or boiled & drained) Kale (cooked, or boiled &  drained) Mustard greens (cooked, or boiled & drained) Parsley *serving size only =  cup Spinach (cooked, or boiled & drained) Swiss chard (cooked, or boiled & drained) Turnip greens (cooked, or boiled & drained)  Eat a consistent number of servings per week of foods MEDIUM-HIGH in Vitamin K (1 serving = 1 cup)  Asparagus (cooked, or boiled & drained) Broccoli (cooked, boiled & drained, or raw & chopped) Brussel sprouts (cooked, or boiled & drained) *serving size only =  cup Lettuce, raw (green leaf, endive, romaine) Spinach, raw Turnip greens, raw & chopped   These websites have more information on Coumadin (warfarin):  FailFactory.se; VeganReport.com.au;

## 2021-06-24 NOTE — TOC Initial Note (Addendum)
Transition of Care [facility]) - Initial/Assessment Note    Patient Details  Name: [name] MRN: [id number] Date of Birth: 1950-11-15  Transition of Care [facility]) CM/SW Contact:    Erenest Rasher, RN Phone Number: [phone] 06/24/2021, 3:07 PM  Clinical Narrative:                 HF TOC CM spoke to pt and states he had HH and SNF rehab in the past. He went to [facility] rehab, and agreeable to go back if PT recommends SNF. Waiting PT evaluation. Gave permission to speak to sister in law, [name] or brother, [name]. No DME in the home. Pt drives to appts with assistance of brother. Contacted brother and states he agreeable to SNF or HH. Pt had [facility] in the past.   Expected Discharge Plan: [place] Barriers to Discharge: Continued Medical Work up   Patient Goals and CMS Choice Patient states their goals for this hospitalization and ongoing recovery are:: wants to do what is recommended to feel better CMS Medicare.gov Compare Post Acute Care list provided to:: Patient Choice offered to / list presented to : Patient  Expected Discharge Plan and Services Expected Discharge Plan: [place] In-house Referral: Clinical Social Work Discharge Planning Services: CM Consult Post Acute Care Choice: [place] arrangements for the past 2 months: [place]                                      Prior Living Arrangements/Services Living arrangements for the past 2 months: Single Family Home Lives with:: Self Patient language and need for interpreter reviewed:: Yes Do you feel safe going back to the place where you live?: Yes      Need for Family Participation in Patient Care: No (Comment) Care giver support system in place?: No (comment)   Criminal Activity/Legal Involvement Pertinent to Current Situation/Hospitalization: No - Comment as needed  Activities of Daily Living      Permission  Sought/Granted Permission sought to share information with : Case Manager, PCP, Family Supports Permission granted to share information with : Yes, Verbal Permission Granted  Share Information with NAME: [place] granted to share info w AGENCY: Home Health, SNF  Permission granted to share info w Relationship: brother, sister in law  Permission granted to share info w Contact Information: [phone]  Emotional Assessment Appearance:: Appears stated age Attitude/Demeanor/Rapport: Engaged Affect (typically observed): Accepting Orientation: : Oriented to Self, Oriented to Place, Oriented to  Time, Oriented to Situation   Psych Involvement: No (comment)  Admission diagnosis:  Acute CHF (congestive heart failure) (HCC) [I50.9] Acute on chronic congestive heart failure, unspecified heart failure type [facility]) [I50.9] Patient Active Problem List   Diagnosis Date Noted   Acute on chronic systolic CHF (congestive heart failure) ([place]) 06/23/2021   CAP (community acquired pneumonia) 08/09/2018   Ventricular tachycardia ([place]) 08/05/2018   Chest pain 12/17/2016   Gout 12/17/2016   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   Chronic systolic CHF (congestive heart failure) ([place]) 12/17/2016   Sepsis ([place]) 12/17/2016   History of liver transplant ([place]) 12/17/2016   History of kidney transplant 12/17/2016   Acute respiratory failure with hypoxia ([place]) 12/17/2016   CKD (chronic kidney disease), stage III ([place]) 12/17/2016   Idiopathic thrombocytopenia (HCC)    HTN (hypertension)    HLD (hyperlipidemia)  CAD (coronary artery disease)    Long term (current) use of anticoagulants [Z79.01] 11/07/2015   Encounter for therapeutic drug monitoring 11/07/2015   Atrial fibrillation ([place]) 08/08/2009   PCP:  [name]., MD Pharmacy:   [facility] [address] [place] [address] [place] [address] Phone: [phone] Fax: [phone]     Social  Determinants of Health (SDOH) Interventions Food Insecurity Interventions: Intervention Not Indicated Financial Strain Interventions: Other (Comment) (referral to Advanced HF clinic for medication assistance for HF meds) Housing Interventions: Intervention Not Indicated Transportation Interventions: Intervention Not Indicated  Readmission Risk Interventions No flowsheet data found.

## 2021-06-24 NOTE — Consult Note (Signed)
Cardiology Consultation:   Patient ID: [name] MRN: [id number]; DOB: 1950-11-19  Admit date: 06/23/2021 Date of Consult: 06/24/2021  Primary Care Provider: [name]., MD [facility] Cardiologist: Dr. Virl Axe MD [facility] Electrophysiologist:  Dr. Virl Axe MD   Patient Profile:   [name] is a 70 y.o. 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   History of Present Illness:   [name] is resting in bed comfortably today, in no acute distress. He feels as though his breathing has improved, but he is still unable to lye flat and is taking. He notes that most of the time he sleeps on his side. He notes that when he has fluid on him it normally accumulates in his legs, but this time he felt as though his abdomen was swollen. He does feel as though it is less swollen since his admission.   Denies recent illness or fever, chills, chest pain, n/v/d. Has been feeling well otherwise, apart from feeling short of breath.   Past Medical History:  Diagnosis Date   Atrial fibrillation ([place]) 08/2009   CAD (coronary artery disease)    s/p CABG -- 1992   ESRD (end stage renal disease) ([place])    HLD (hyperlipidemia)    HTN (hypertension)    Idiopathic thrombocytopenia (H[facility])     Past Surgical History:  Procedure Laterality Date   APPENDECTOMY     CORONARY ARTERY BYPASS GRAFT     FACIAL COSMETIC SURGERY     GALLBLADDER SURGERY     HERNIA REPAIR     KIDNEY TRANSPLANT     LEFT HEART CATH AND CORONARY ANGIOGRAPHY N/A 08/07/2018   Procedure: LEFT HEART CATH AND CORONARY ANGIOGRAPHY;  Surgeon: Sherren Mocha, MD;  Location: [facility];  Service: Cardiovascular;  Laterality: N/A;   LIVER TRANSPLANT        Home Medications:  Prior to Admission medications   Medication Sig Start Date End Date Taking? Authorizing Provider  acetaminophen (TYLENOL) 500 MG tablet Take 1,000 mg by mouth every 6 (six) hours as needed for mild pain or headache.    Yes [provider]  allopurinol (ZYLOPRIM) 100 MG tablet Take 100 mg by mouth daily.   Yes [provider]  amiodarone (PACERONE) 200 MG tablet TAKE 1 TABLET BY MOUTH EVERY DAY Patient taking differently: Take 200 mg by mouth daily. 04/27/21  Yes Deboraha Sprang, MD  atorvastatin (LIPITOR) 20 MG tablet Take 20 mg by mouth at bedtime.    Yes [provider]  calcitRIOL (ROCALTROL) 0.25 MCG capsule Take 0.25 mcg by mouth daily.   Yes [provider]  Cholecalciferol (VITAMIN D3) 25 MCG (1000 UT) CAPS Take 1,000 Units by mouth daily.    Yes [provider]  Cyanocobalamin (VITAMIN B12) 500 MCG TABS Take 500 mcg by mouth daily.    Yes [provider]  DULoxetine (CYMBALTA) 60 MG capsule Take 60 mg by mouth at bedtime.   Yes [provider]  EPINEPHrine (EPI-PEN) 0.3 mg/0.3 mL DEVI Inject 0.3 mg into the muscle once as needed (severe allergic reaction).   Yes [provider]  ferrous sulfate 325 (65 FE) MG tablet Take 325 mg by mouth daily with breakfast.   Yes [provider]  fish oil-omega-3 fatty acids 1000 MG capsule Take 2 g by mouth daily.   Yes [provider]  fluticasone (FLONASE) 50 MCG/ACT nasal spray Place 2 sprays into both nostrils daily as needed for allergies or rhinitis.    Yes [provider]  furosemide (LASIX) 40 MG tablet Take 40 mg by mouth 2 (two) times daily.   Yes [provider]  MAGNESIUM-OXIDE 400 (240 Mg) MG tablet Take 400 mg by mouth 2 (two) times daily. 05/04/21  Yes [provider]  melatonin 5 MG TABS Take 5 mg by mouth at bedtime.   Yes [provider]  metolazone (ZAROXOLYN) 5 MG tablet Take 5 mg by  mouth See admin instructions. Takes 5 mg twice weekly 09/14/18  Yes [provider]  nitroGLYCERIN (NITROSTAT) 0.4 MG SL tablet Place 0.4 mg under the tongue every 5 (five) minutes as needed for chest pain.   Yes [provider]  potassium chloride SA (KLOR-CON) 20 MEQ tablet Take 2 tablets by mouth 3 (three) times daily.   Yes [provider]  predniSONE (DELTASONE) 5 MG tablet Take 1 tablet (5 mg total) by mouth daily. 12/29/16  Yes [name], MD  QUEtiapine (SEROQUEL) 50 MG tablet Take 150 mg by mouth at bedtime. 08/13/19  Yes [provider]  tacrolimus (PROGRAF) 0.5 MG capsule Take 0.5 mg by mouth See admin instructions. Takes 1 tablet by mouth one day and 2 tablets next day (1 and 2 tablets on alternate days)   Yes [provider]  traZODone (DESYREL) 150 MG tablet Take 150 mg by mouth at bedtime as needed for sleep.    Yes [provider]  warfarin (COUMADIN) 4 MG tablet TAKE AS DIRECTED BY COUMADIN CLINIC Patient taking differently: Take 2-4 mg by mouth See admin instructions. Takes 4 mg on Tuesdays and 2 mg on all other days 09/19/20  Yes Deboraha Sprang, MD  albuterol (PROVENTIL) (2.5 MG/3ML) 0.083% nebulizer solution Take 2.5 mg by nebulization every 2 (two) hours as needed for wheezing or shortness of breath. Patient not taking: Reported on 06/23/2021    [provider]  carvedilol (COREG) 6.25 MG tablet TAKE 1 TABLET BY MOUTH IN THE MORNING AND 2 TABLETS AT NIGHT Patient not taking: No sig reported 03/11/21   Deboraha Sprang, MD  losartan (COZAAR) 25 MG tablet Take 1 tablet by mouth daily. Patient not taking: No sig reported 05/11/21 05/11/22  [provider]    Inpatient Medications: Scheduled Meds:  allopurinol  100 mg Oral Daily   amiodarone  200 mg Oral Daily   aspirin EC  81 mg Oral Daily   atorvastatin  20 mg Oral QHS   calcitRIOL  0.25 mcg Oral Daily   DULoxetine  60 mg Oral QHS   ferrous sulfate  325  mg Oral Q breakfast   furosemide  80 mg Intravenous BID   magnesium oxide  400 mg Oral BID   melatonin  1.5 mg Oral QHS   omega-3 acid ethyl esters  2 g Oral Daily   potassium chloride SA  40 mEq Oral BID   predniSONE  5 mg Oral Daily   QUEtiapine  150 mg Oral QHS   sodium chloride flush  3 mL Intravenous Q12H   [START ON 06/25/2021] tacrolimus  0.5 mg Oral QODAY   tacrolimus  1 mg Oral QODAY   vitamin B-12  500 mcg Oral Daily   warfarin  2 mg Oral Once per day on  Nancy Fetter Mon Wed Thu Fri Sat   [START ON 06/30/2021] warfarin  4 mg Oral Once per day on Tue   Warfarin - Pharmacist Dosing Inpatient   Does not apply q1600   Continuous Infusions:  sodium chloride     PRN Meds: sodium chloride, acetaminophen, albuterol, fluticasone, nitroGLYCERIN, ondansetron (ZOFRAN) IV, sodium chloride flush, traZODone, zolpidem  Allergies:    Allergies  Allergen Reactions   Other Anaphylaxis    Spider venom   Grapefruit Concentrate Other (See Comments)    Pt has been told not to eat grapefruit   Haloperidol Other (See Comments)    Caused the patient to be overly-sedated   Nsaids Other (See Comments)    Cannot take due to liver transplant   Rifampin Other (See Comments)    Caused flushing   Triazolam Other (See Comments)    Caused the patient to be overly-sedated    Social History:   Social History   Socioeconomic History   Marital status: Single    Spouse name: Not on file   Number of children: 0   Years of education: Not on file   Highest education level: Not on file  Occupational History   Occupation: retired  Tobacco Use   Smoking status: Never   Smokeless tobacco: Never  Substance and Sexual Activity   Alcohol use: No   Drug use: No   Sexual activity: Not on file  Other Topics Concern   Not on file  Social History Narrative   Not on file   Social Determinants of Health   Financial Resource Strain: Not on file  Food Insecurity: Not on file  Transportation Needs: Not on file   Physical Activity: Not on file  Stress: Not on file  Social Connections: Not on file  Intimate Partner Violence: Not on file    Family History:   Family History  Problem Relation Age of Onset   Atrial fibrillation Mother    Breast cancer Mother    Renal cancer Father    Hypertension Brother    Hyperlipidemia Brother    Other Brother        stent    Renal Disease Maternal Grandmother    Stroke Maternal Grandmother    Heart attack Maternal Grandfather    Cancer Paternal Grandmother    Heart failure Paternal Grandfather    Emphysema Paternal Grandfather    Bronchiolitis Paternal Grandfather     ROS:  Please see the history of present illness.  All other ROS reviewed and negative.     Physical Exam/Data:   Vitals:   06/24/21 0200 06/24/21 0416 06/24/21 0749 06/24/21 1203  BP:      Pulse:      Resp:      Temp:  97.7 F (36.5 C) 97.6 F (36.4 C) 98.3 F (36.8 C)  TempSrc:  Oral Oral Oral  SpO2: 99%     Weight:      Height:        Intake/Output Summary (Last 24 hours) at 06/24/2021 1326 Last data filed at 06/24/2021 1300 Gross per 24 hour  Intake 1023 ml  Output 2575 ml  Net -1552 ml   Last 3 Weights 06/23/2021 06/23/2021 12/04/2020  Weight (lbs) 193 lb 1.6 oz 199 lb 3.2 oz 186 lb  Weight (kg) 87.59 kg 90.357 kg 84.369 kg     Body mass index is 27.71 kg/m.  General:  No acute distress HEENT: normal Lymph: no adenopathy Neck: JVD mid neck Endocrine:  No thyromegaly  Cardiac:  irregularly irregular, no murmurs, gallops, rubs.  Lungs:  Tachypneic, clear to auscultation bilaterally, no wheezing, rhonchi or rales Abd: soft, non-tender, mildly distended abdomen Ext: no edema Musculoskeletal:  No deformities Skin: warm and dry  Neuro:  no focal abnormalities noted Psych:  Normal affect   EKG:  The EKG was personally reviewed and demonstrates:  Atrial Fibrillation, rate of 80 BPM Telemetry:  Telemetry was personally reviewed and demonstrates:  Atrial  Fib  Relevant CV Studies:  TTE 06/24/21 1. Inferoseptal and basal to mid inferior and posterolateral hypokinesis.  Left ventricular ejection fraction, by estimation, is 45 to 50%. The left  ventricle has mildly decreased function. The left ventricle demonstrates  regional wall motion  abnormalities (see scoring diagram/findings for description). There is  moderate concentric left ventricular hypertrophy. Left ventricular  diastolic parameters are indeterminate.   2. Right ventricular systolic function is normal. The right ventricular  size is normal. There is moderately elevated pulmonary artery systolic  pressure.   3. Left atrial size was severely dilated.   4. The mitral valve is normal in structure. Trivial mitral valve  regurgitation. No evidence of mitral stenosis.   5. The aortic valve is tricuspid. Aortic valve regurgitation is mild. No  aortic stenosis is present. Aortic regurgitation PHT measures 504 msec.  Aortic valve area, by VTI measures 1.81 cm. Aortic valve mean gradient  measures 7.0 mmHg. Aortic valve Vmax   measures 1.95 m/s.   6. The inferior vena cava is normal in size with greater than 50%  respiratory variability, suggesting right atrial pressure of 3 mmHg.   Heart Catheterization 2019 S/P CABG 1992 1. Severe native 3 vessel CAD with total occlusion of the LAD, LCx, and RCA 2. S/P CABG with continued patency of the LIMA-LAD, SVG-ramus, and SVG-PDA 3. Chronic occlusion of the SVG-OM1 4. Moderate stenosis of the SVG-ramus. Favor medical therapy considering comorbidities, thrombocytopenia, chronic anticoagulation 5. Elevated LVEDP  Laboratory Data:  High Sensitivity Troponin:   Recent Labs  Lab 06/23/21 1707 06/23/21 1822  TROPONINIHS 28* 27*     Chemistry Recent Labs  Lab 06/23/21 1707 06/24/21 0255  NA 139 139  K 3.8 3.4*  CL 101 101  CO2 29 31  GLUCOSE 125* 117*  BUN 24* 23  CREATININE 1.91* 2.03*  CALCIUM 9.5 9.4  GFRNONAA 38* 35*   ANIONGAP 9 7    Recent Labs  Lab 06/23/21 1707  PROT 7.0  ALBUMIN 3.5  AST 17  ALT 16  ALKPHOS 62  BILITOT 2.0*   Hematology Recent Labs  Lab 06/23/21 1707  WBC 5.5  RBC 4.74  HGB 13.4  HCT 42.3  MCV 89.2  MCH 28.3  MCHC 31.7  RDW 16.9*  PLT 73*   BNP Recent Labs  Lab 06/23/21 1707  BNP 186.0*    DDimer No results for input(s): DDIMER in the last 168 hours.   Radiology/Studies:  DG Chest Port 1 View  Result Date: 06/23/2021 CLINICAL DATA:  Shortness of breath EXAM: PORTABLE CHEST 1 VIEW COMPARISON:  Chest radiograph 08/29/2020 FINDINGS: The heart is significantly enlarged, similar to the prior study. The mediastinal contours are stable. Median sternotomy wires and mediastinal surgical clips are stable. There are increased interstitial markings bilaterally. There are patchy opacities in the right base with a small right pleural effusion. The left costophrenic angle is cut off; a left pleural effusion cannot be excluded. The left upper lung is well aerated. There is no pneumothorax. The bones are unremarkable. IMPRESSION:  1. Small right pleural effusion with adjacent airspace disease which may reflect atelectasis or pneumonia. Recommend follow-up radiographs in 6-8 weeks to assess for resolution. 2. Cardiomegaly with mild pulmonary interstitial edema. 3. A left pleural effusion cannot be excluded as the costophrenic angle is not included in the field of view. Electronically Signed   By: Valetta Mole M.D.   On: 06/23/2021 16:49   ECHOCARDIOGRAM COMPLETE  Result Date: 06/24/2021    ECHOCARDIOGRAM REPORT   Patient Name:   [name] Date of Exam: 06/24/2021 Medical Rec #:  [id number]          Height:       70.0 in Accession #:    [id number]         Weight:       193.1 lb Date of Birth:  [birth year]          BSA:          2.056 m Patient Age:    54 years           BP:           135/108 mmHg Patient Gender: M                  HR:           77 bpm. Exam Location:  Inpatient  Procedure: 2D Echo, Cardiac Doppler, Color Doppler and 3D Echo Indications:    CHF  History:        Patient has prior history of Echocardiogram examinations, most                 recent 08/06/2018.  Sonographer:    Merrie Roof RDCS Referring Phys: [place]  1. Inferoseptal and basal to mid inferior and posterolateral hypokinesis. Left ventricular ejection fraction, by estimation, is 45 to 50%. The left ventricle has mildly decreased function. The left ventricle demonstrates regional wall motion abnormalities (see scoring diagram/findings for description). There is moderate concentric left ventricular hypertrophy. Left ventricular diastolic parameters are indeterminate.  2. Right ventricular systolic function is normal. The right ventricular size is normal. There is moderately elevated pulmonary artery systolic pressure.  3. Left atrial size was severely dilated.  4. The mitral valve is normal in structure. Trivial mitral valve regurgitation. No evidence of mitral stenosis.  5. The aortic valve is tricuspid. Aortic valve regurgitation is mild. No aortic stenosis is present. Aortic regurgitation PHT measures 504 msec. Aortic valve area, by VTI measures 1.81 cm. Aortic valve mean gradient measures 7.0 mmHg. Aortic valve Vmax  measures 1.95 m/s.  6. The inferior vena cava is normal in size with greater than 50% respiratory variability, suggesting right atrial pressure of 3 mmHg. FINDINGS  Left Ventricle: Inferoseptal and basal to mid inferior and posterolateral hypokinesis. Left ventricular ejection fraction, by estimation, is 45 to 50%. The left ventricle has mildly decreased function. The left ventricle demonstrates regional wall motion abnormalities. The left ventricular internal cavity size was normal in size. There is moderate concentric left ventricular hypertrophy. Left ventricular diastolic parameters are indeterminate. Right Ventricle: The right ventricular size is normal. No increase in  right ventricular wall thickness. Right ventricular systolic function is normal. There is moderately elevated pulmonary artery systolic pressure. The tricuspid regurgitant velocity is 3.62 m/s, and with an assumed right atrial pressure of 3 mmHg, the estimated right ventricular systolic pressure is 16.0 mmHg. Left Atrium: Left atrial size was severely dilated. Right Atrium: Right atrial size was normal in  size. Pericardium: There is no evidence of pericardial effusion. Mitral Valve: The mitral valve is normal in structure. Trivial mitral valve regurgitation. No evidence of mitral valve stenosis. Tricuspid Valve: The tricuspid valve is normal in structure. Tricuspid valve regurgitation is mild . No evidence of tricuspid stenosis. Aortic Valve: The aortic valve is tricuspid. Aortic valve regurgitation is mild. Aortic regurgitation PHT measures 504 msec. No aortic stenosis is present. Aortic valve mean gradient measures 7.0 mmHg. Aortic valve peak gradient measures 15.2 mmHg. Aortic valve area, by VTI measures 1.81 cm. Pulmonic Valve: The pulmonic valve was normal in structure. Pulmonic valve regurgitation is trivial. No evidence of pulmonic stenosis. Aorta: The aortic root is normal in size and structure. Venous: The inferior vena cava is normal in size with greater than 50% respiratory variability, suggesting right atrial pressure of 3 mmHg. IAS/Shunts: No atrial level shunt detected by color flow Doppler.  LEFT VENTRICLE PLAX 2D LVIDd:         5.80 cm LVIDs:         3.80 cm LV PW:         1.30 cm LV IVS:        1.40 cm LVOT diam:     2.00 cm  3D Volume EF: LV SV:         64       3D EF:        56 % LV SV Index:   31       LV EDV:       192 ml LVOT Area:     3.14 cm LV ESV:       85 ml                         LV SV:        108 ml RIGHT VENTRICLE RV Basal diam:  3.30 cm RV S prime:     7.07 cm/s TAPSE (M-mode): 1.3 cm LEFT ATRIUM              Index       RIGHT ATRIUM           Index LA diam:        6.10 cm  2.97  cm/m  RA Area:     17.30 cm LA Vol (A2C):   110.0 ml 53.49 ml/m RA Volume:   47.20 ml  22.95 ml/m LA Vol (A4C):   130.0 ml 63.22 ml/m LA Biplane Vol: 130.0 ml 63.22 ml/m  AORTIC VALVE AV Area (Vmax):    1.63 cm AV Area (Vmean):   1.83 cm AV Area (VTI):     1.81 cm AV Vmax:           195.00 cm/s AV Vmean:          119.000 cm/s AV VTI:            0.356 m AV Peak Grad:      15.2 mmHg AV Mean Grad:      7.0 mmHg LVOT Vmax:         101.00 cm/s LVOT Vmean:        69.500 cm/s LVOT VTI:          0.205 m LVOT/AV VTI ratio: 0.58 AI PHT:            504 msec  AORTA Ao Root diam: 3.10 cm TRICUSPID VALVE TR Peak grad:   52.4 mmHg TR Vmax:  362.00 cm/s  SHUNTS Systemic VTI:  0.20 m Systemic Diam: 2.00 cm Skeet Latch MD Electronically signed by Skeet Latch MD Signature Date/Time: 06/24/2021/12:24:55 PM    Final      Assessment and Plan:   Acute on chronic systolic/diastolic HF EF 70-48% on echocardiogram today, improvement of dyspnea since admission. Weight yesterday of 87.6 kg, last dry weight of 85 kg. Responding well to IV lasix, JVD improving and patient feels as though abdomen is less swollen. Patient notes normally fluid goes to his legs, not his abdomen. Low suspicion for SBP, patient afebrile during admission. Will monitor closely.   While recent medication changes to patient's HF regimen may be contributing to acute exacerbation, unable to rule out ischemic etiology. Heart failure team following as well and planning for RHC tomorrow. With history of kidney and liver disease, will need amyloid workup. Does not appear as though one has been done in the past. -continue IV lasix 80 mg BID -continue po potassium, check bmp/mg daily -RHC tomorrow, hold warfarin overnight -Amyloid workup, spep/upep, PYP scan  Permanent Atrial Fibrillation Hx of VT Remains rate controlled, failed multiple attempts to cardiovert in the past. Current regimen of warfarin 4mg  every day except 2 mg one day a  week. Severely dilated left atrium on echocardiogram. Hx of episode of VT in 2019, stable on amiodarone.  -hold warfarin this evening, plan for RHC tomorrow -continue amiodarone  CAD s/p CABG CABG in 1992, LHC in 2019 as per above. Denies chest pain. Mildly elevated high sensitivity troponins, suspect due to demand ischemia 2/2 hypervolemic state. Last LDL 89 and TC 141 in 2019 -on warfarin indefinitely, holding for procedure tomorrow. 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 This eventually failed and patient was started on dialysis. 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 Since then, patient has followed with [facility] and [facility] transplant Teams. Cr today seems to be at baseline, 1.9-2.0 Due to the complexity of patient's medical history will consult hospitalist team to assist in patient's care.  -Appreciate hospitalist/nephrology assistance -continue tacrolimus and prednisone -monitor kidney function closely  Pulmonary Hypertension Patient with evidence on echocardiogram today. In the past scheduled to see pulm for sleep study, but see no evidence of this. Patient noted to be hypoxic, O2 sat in the 80's when sleeping -will need outpatient workup for sleep apnea  Code Status: DNR  Please await attending attesting for final recommendations  For questions or updates, please contact [place] Please consult [email or web address] for contact info under   Signed, [place]  Internal Medicine Resident PGY-2 [place]  Pager: [phone]

## 2021-06-24 NOTE — Telephone Encounter (Addendum)
Advanced Heart Failure Patient Advocate Encounter  Received a message that the patient has a $100 [place] co-pay, which is unaffordable. Was able to obtain a PAN HF grant to help cover the cost of Farxiga.  Member ID: [id number] Group ID: [id number] RxBin ID: [id number] PCN: [id number] Eligibility Start Date: 03/26/2021 Eligibility End Date: 06/23/2022 Assistance Amount: $1,000.00  Called and spoke with patent. Emailed a copy of the grant information. Nira Conn, (RN) also sent a RX with the grant information attached.  Charlann Boxer, CPhT

## 2021-06-24 NOTE — TOC Benefit Eligibility Note (Signed)
Patient Teacher, English as a foreign language completed.    The patient is currently admitted and upon discharge could be taking Farxiga 10 mg.  The current 30 day co-pay is, $100.82.   The patient is currently admitted and upon discharge could be taking Jardiance 10 mg.  The current 30 day co-pay is, $104.79.   The patient is insured through [place], [place] Patient Advocate Specialist [place] Team Direct Number: [phone]  Fax: [phone]

## 2021-06-24 NOTE — Progress Notes (Signed)
  Echocardiogram 2D Echocardiogram has been performed.  Merrie Roof F 06/24/2021, 10:01 AM

## 2021-06-24 NOTE — Consult Note (Addendum)
Advanced Heart Failure Team Consult Note   Primary Physician: [name]., MD PCP-Cardiologist:  Dr. [name]  Reason for Consultation: Acute on chronic systolic HF  HPI:    [name] is seen today for evaluation of acute on chronic systolic HF at the request of Dr. Harrell Gave with Cardiology. 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 [name], depression, chronic ITP. Has been on midodrine in past for hypotension, however; does not recall ever taking the medicine.   He has been a DNR. Reports poor quality of life for several years. Weak and dyspnea with minimal activity.  Had multiple admissions for acute on chronic CHF several years ago but none in the last 2 years until recently.  Patient admitted to [place] at end of June with acute on chronic CHF. Diuresed with IV lasix. Sent home on prior home dose furosemide at 40 mg BID.  Metolazone was stopped.  Echo with EF 45%. Coreg held d/t hypotension and started on Toprol xl at D/C. Not on SGLT2i d/t cost. Initiated on losartan.   Seen in ED 07/21 with lightheadedness and hypotension. No orthostasis. Losartan and metoprolol held.  Has multiple dental caries treated along with several extractions at [facility] 08/02.  He was seen in clinic by Dr. [name] yesterday and appeared volume overloaded. Short of breath with 10# lb weight gain. Had restarted metolazone on his own a week prior (2 doses so far).   Admission recommended for diuresis and possible inotropic support. Sent to ED for evaluation. Creatinine 1.90 (baseline 1.9-low 2s), Hgb okay, platelets 73 (chronic), BNP 186 (no recent value for comparison), HS trop 27>28. Negative for COVID. ECG with afib and controlled rate. Chest x-ray consistent with CHF and small right effusion  with adjacent airspace disease which may be due to atelectasis or pneumonia. He was given 40 mg lasix IV X2 and admitted to cardiology service for management of acute on chronic CHF. Midodrine held since blood pressure okay. Remained volume up and started on 80 mg lasix IV BID this am.  Echo today with EF 45-50%, multiple WMA, RV okay, PASP 55 mmHg, LA severely dilated, mild TR, trial MR.  Lives alone. ADLs difficult due to dyspnea and fatigue.   Does not use tobacco or consume alcohol.  Review of Systems: [y] = yes, [ ]  = no   General: Weight gain [Y]; Weight loss [ ] ; Anorexia [ ] ; Fatigue [Y]; Fever [ ] ; Chills [ ] ; Weakness [Y]  Cardiac: Chest pain/pressure [ ] ; Resting SOB [ ] ; Exertional SOB [Y]; Orthopnea [Y]; Pedal Edema [Y]; Palpitations [ ] ; Syncope [ ] ; Presyncope [ ] ; Paroxysmal nocturnal dyspnea[ ]   Pulmonary: Cough [ ] ; Wheezing[ ] ; Hemoptysis[ ] ; Sputum [ ] ; Snoring [ ]   GI: Vomiting[ ] ; Dysphagia[ ] ; Melena[ ] ; Hematochezia [ ] ; Heartburn[ ] ; Abdominal pain [ ] ; Constipation [ ] ; Diarrhea [ ] ; BRBPR [ ]   GU: Hematuria[ ] ; Dysuria [ ] ; Nocturia[ ]   Vascular: Pain in legs with walking [ ] ; Pain in feet with lying flat [ ] ; Non-healing sores [ ] ; Stroke [ ] ; TIA [ ] ; Slurred speech [ ] ;  Neuro: Headaches[ ] ; Vertigo[ ] ; Seizures[ ] ; Paresthesias[ ] ;Blurred vision [ ] ; Diplopia [ ] ; Vision changes [ ]   Ortho/Skin: Arthritis [ ] ;  Joint pain [ ] ; Muscle pain [ ] ; Joint swelling [ ] ; Back Pain [ ] ; Rash [ ]   Psych: Depression[Y]; Anxiety[ ]   Heme: Bleeding problems [ ] ; Clotting disorders [ ] ; Anemia [ ]   Endocrine: Diabetes [ ] ; Thyroid dysfunction[ ]   Home Medications Prior to Admission medications   Medication Sig Start Date End Date Taking? Authorizing Provider  acetaminophen (TYLENOL) 500 MG tablet Take 1,000 mg by mouth every 6 (six) hours as needed for mild pain or headache.    Yes [provider]  allopurinol (ZYLOPRIM) 100 MG tablet Take 100 mg by mouth daily.    Yes [provider]  amiodarone (PACERONE) 200 MG tablet TAKE 1 TABLET BY MOUTH EVERY DAY Patient taking differently: Take 200 mg by mouth daily. 04/27/21  Yes Deboraha Sprang, MD  atorvastatin (LIPITOR) 20 MG tablet Take 20 mg by mouth at bedtime.    Yes [provider]  calcitRIOL (ROCALTROL) 0.25 MCG capsule Take 0.25 mcg by mouth daily.   Yes [provider]  Cholecalciferol (VITAMIN D3) 25 MCG (1000 UT) CAPS Take 1,000 Units by mouth daily.    Yes [provider]  Cyanocobalamin (VITAMIN B12) 500 MCG TABS Take 500 mcg by mouth daily.    Yes [provider]  DULoxetine (CYMBALTA) 60 MG capsule Take 60 mg by mouth at bedtime.   Yes [provider]  EPINEPHrine (EPI-PEN) 0.3 mg/0.3 mL DEVI Inject 0.3 mg into the muscle once as needed (severe allergic reaction).   Yes [provider]  ferrous sulfate 325 (65 FE) MG tablet Take 325 mg by mouth daily with breakfast.   Yes [provider]  fish oil-omega-3 fatty acids 1000 MG capsule Take 2 g by mouth daily.   Yes [provider]  fluticasone (FLONASE) 50 MCG/ACT nasal spray Place 2 sprays into both nostrils daily as needed for allergies or rhinitis.    Yes [provider]  furosemide (LASIX) 40 MG tablet Take 40 mg by mouth 2 (two) times daily.   Yes [provider]  MAGNESIUM-OXIDE 400 (240 Mg) MG tablet Take 400 mg by mouth 2 (two) times daily. 05/04/21  Yes [provider]  melatonin 5 MG TABS Take 5 mg by mouth at bedtime.   Yes [provider]  metolazone (ZAROXOLYN) 5 MG tablet Take 5 mg by mouth See admin instructions. Takes 5 mg twice weekly 09/14/18  Yes [provider]  nitroGLYCERIN (NITROSTAT) 0.4 MG SL tablet Place 0.4 mg under the tongue every 5 (five) minutes as needed for chest pain.   Yes [provider]  potassium chloride SA (KLOR-CON) 20 MEQ tablet Take 2 tablets by mouth 3 (three) times daily.   Yes  [provider]  predniSONE (DELTASONE) 5 MG tablet Take 1 tablet (5 mg total) by mouth daily. 12/29/16  Yes [name], MD  QUEtiapine (SEROQUEL) 50 MG tablet Take 150 mg by mouth at bedtime. 08/13/19  Yes [provider]  tacrolimus (PROGRAF) 0.5 MG capsule Take 0.5 mg by mouth See admin instructions. Takes 1 tablet by mouth one day and 2 tablets next day (1 and 2 tablets on alternate days)   Yes [provider]  traZODone (DESYREL) 150 MG tablet Take 150 mg by mouth at bedtime as needed for sleep.    Yes [provider]  warfarin (COUMADIN) 4 MG tablet TAKE AS DIRECTED BY COUMADIN CLINIC Patient taking differently: Take 2-4 mg by mouth See admin instructions. Takes 4  mg on Tuesdays and 2 mg on all other days 09/19/20  Yes Deboraha Sprang, MD  albuterol (PROVENTIL) (2.5 MG/3ML) 0.083% nebulizer solution Take 2.5 mg by nebulization every 2 (two) hours as needed for wheezing or shortness of breath. Patient not taking: Reported on 06/23/2021    [provider]  carvedilol (COREG) 6.25 MG tablet TAKE 1 TABLET BY MOUTH IN THE MORNING AND 2 TABLETS AT NIGHT Patient not taking: No sig reported 03/11/21   Deboraha Sprang, MD  losartan (COZAAR) 25 MG tablet Take 1 tablet by mouth daily. Patient not taking: No sig reported 05/11/21 05/11/22  [provider]    Past Medical History: Past Medical History:  Diagnosis Date   Atrial fibrillation ([place]) 08/2009   CAD (coronary artery disease)    s/p CABG -- 1992   ESRD (end stage renal disease) ([place])    HLD (hyperlipidemia)    HTN (hypertension)    Idiopathic thrombocytopenia ([facility])     Past Surgical History: Past Surgical History:  Procedure Laterality Date   APPENDECTOMY     CORONARY ARTERY BYPASS GRAFT     FACIAL COSMETIC SURGERY     GALLBLADDER SURGERY     HERNIA REPAIR     KIDNEY TRANSPLANT     LEFT HEART CATH AND CORONARY ANGIOGRAPHY N/A 08/07/2018   Procedure: LEFT HEART CATH AND CORONARY  ANGIOGRAPHY;  Surgeon: Sherren Mocha, MD;  Location: [facility];  Service: Cardiovascular;  Laterality: N/A;   LIVER TRANSPLANT      Family History: Family History  Problem Relation Age of Onset   Atrial fibrillation Mother    Breast cancer Mother    Renal cancer Father    Hypertension Brother    Hyperlipidemia Brother    Other Brother        stent    Renal Disease Maternal Grandmother    Stroke Maternal Grandmother    Heart attack Maternal Grandfather    Cancer Paternal Grandmother    Heart failure Paternal Grandfather    Emphysema Paternal Grandfather    Bronchiolitis Paternal Grandfather     Social History: Social History   Socioeconomic History   Marital status: Single    Spouse name: Not on file   Number of children: 0   Years of education: Not on file   Highest education level: Not on file  Occupational History   Occupation: retired  Tobacco Use   Smoking status: Never   Smokeless tobacco: Never  Substance and Sexual Activity   Alcohol use: No   Drug use: No   Sexual activity: Not on file  Other Topics Concern   Not on file  Social History Narrative   Not on file   Social Determinants of Health   Financial Resource Strain: Not on file  Food Insecurity: Not on file  Transportation Needs: Not on file  Physical Activity: Not on file  Stress: Not on file  Social Connections: Not on file    Allergies:  Allergies  Allergen Reactions   Other Anaphylaxis    Spider venom   Grapefruit Concentrate Other (See Comments)    Pt has been told not to eat grapefruit   Haloperidol Other (See Comments)    Caused the patient to be overly-sedated   Nsaids Other (See Comments)    Cannot take due to liver transplant   Rifampin Other (See Comments)    Caused flushing   Triazolam Other (See Comments)    Caused the patient to be overly-sedated  Objective:    Vital Signs:   Temp:  [97.6 F (36.4 C)-98.6 F (37 C)] 97.6 F (36.4 C) (08/17  0749) Pulse Rate:  [72-89] 86 (08/16 2030) Resp:  [14-36] 24 (08/16 2300) BP: (115-166)/(51-123) 118/69 (08/16 2300) SpO2:  [80 %-99 %] 99 % (08/17 0200) Weight:  [87.6 kg] 87.6 kg (08/16 2201) Last BM Date: 06/23/21  Weight change: Filed Weights   06/23/21 2201  Weight: 87.6 kg    Intake/Output:   Intake/Output Summary (Last 24 hours) at 06/24/2021 1018 Last data filed at 06/24/2021 0816 Gross per 24 hour  Intake 600 ml  Output 1825 ml  Net -1225 ml      Physical Exam    General:  Chronically ill appearing. On 3L [place], off upon entering room. HEENT: normal Neck: supple. + JVD. Carotids 2+ bilat; no bruits. No lymphadenopathy or thyromegaly appreciated. Cor: PMI nondisplaced. Regular rate & rhythm. No rubs, gallops or murmurs. Lungs: Bibasilar crackles Abdomen: soft, nontender, + distended. No hepatosplenomegaly. No bruits or masses. Good bowel sounds. Extremities: no cyanosis, clubbing, rash, edema. RUE AV fistula Neuro: alert & orientedx3, cranial nerves grossly intact. moves all 4 extremities w/o difficulty. Affect pleasant   Telemetry   Afib, 70s-80s (personally reviewed)  EKG    Afib with rate 76 bpm, IVCD with QRS 131 ms  Labs   Basic Metabolic Panel: Recent Labs  Lab 06/23/21 1707 06/24/21 0255  NA 139 139  K 3.8 3.4*  CL 101 101  CO2 29 31  GLUCOSE 125* 117*  BUN 24* 23  CREATININE 1.91* 2.03*  CALCIUM 9.5 9.4  MG  --  2.0    Liver Function Tests: Recent Labs  Lab 06/23/21 1707  AST 17  ALT 16  ALKPHOS 62  BILITOT 2.0*  PROT 7.0  ALBUMIN 3.5   No results for input(s): LIPASE, AMYLASE in the last 168 hours. No results for input(s): AMMONIA in the last 168 hours.  CBC: Recent Labs  Lab 06/23/21 1707  WBC 5.5  NEUTROABS 4.4  HGB 13.4  HCT 42.3  MCV 89.2  PLT 73*    Cardiac Enzymes: No results for input(s): CKTOTAL, CKMB, CKMBINDEX, TROPONINI in the last 168 hours.  BNP: BNP (last 3 results) Recent Labs    06/23/21 1707   BNP 186.0*    ProBNP (last 3 results) No results for input(s): PROBNP in the last 8760 hours.   CBG: No results for input(s): GLUCAP in the last 168 hours.  Coagulation Studies: Recent Labs    06/23/21 1707 06/24/21 0255  LABPROT 26.6* 25.1*  INR 2.5* 2.3*     Imaging   DG Chest Port 1 View  Result Date: 06/23/2021 CLINICAL DATA:  Shortness of breath EXAM: PORTABLE CHEST 1 VIEW COMPARISON:  Chest radiograph 08/29/2020 FINDINGS: The heart is significantly enlarged, similar to the prior study. The mediastinal contours are stable. Median sternotomy wires and mediastinal surgical clips are stable. There are increased interstitial markings bilaterally. There are patchy opacities in the right base with a small right pleural effusion. The left costophrenic angle is cut off; a left pleural effusion cannot be excluded. The left upper lung is well aerated. There is no pneumothorax. The bones are unremarkable. IMPRESSION: 1. Small right pleural effusion with adjacent airspace disease which may reflect atelectasis or pneumonia. Recommend follow-up radiographs in 6-8 weeks to assess for resolution. 2. Cardiomegaly with mild pulmonary interstitial edema. 3. A left pleural effusion cannot be excluded as the costophrenic angle is not included in  the field of view. Electronically Signed   By: Valetta Mole M.D.   On: 06/23/2021 16:49     Medications:     Current Medications:  allopurinol  100 mg Oral Daily   amiodarone  200 mg Oral Daily   aspirin EC  81 mg Oral Daily   atorvastatin  20 mg Oral QHS   calcitRIOL  0.25 mcg Oral Daily   DULoxetine  60 mg Oral QHS   ferrous sulfate  325 mg Oral Q breakfast   furosemide  80 mg Intravenous BID   magnesium oxide  400 mg Oral BID   melatonin  1.5 mg Oral QHS   omega-3 acid ethyl esters  2 g Oral Daily   pneumococcal 23 valent vaccine  0.5 mL Intramuscular Tomorrow-1000   potassium chloride SA  40 mEq Oral BID   predniSONE  5 mg Oral Daily    QUEtiapine  150 mg Oral QHS   sodium chloride flush  3 mL Intravenous Q12H   [START ON 06/25/2021] tacrolimus  0.5 mg Oral QODAY   tacrolimus  1 mg Oral QODAY   vitamin B-12  500 mcg Oral Daily   warfarin  2 mg Oral Once per day on Sun Mon Wed Thu Fri Sat   [START ON 06/30/2021] warfarin  4 mg Oral Once per day on Tue   Warfarin - Pharmacist Dosing Inpatient   Does not apply q1600    Infusions:  sodium chloride         Assessment/Plan  Acute on chronic systolic HF/ICM: -EF variable over years, EF 45-50% 2016, 55-65% 2018, 35-40% in 2019 and 2020, 45% on echo at [facility] June 2022. EF 45-50% on echo this admit. WMA. Moderate LVH. RV okay. -RHC tomorrow to better assess  -Need to r/o cardiac amyloid. SPEP/UPEP with immunofixation. PYP scan this admit. -Admission to [place] in June with a/c CHF. D/C weight 189 lb. Multiple med changes recently. Now off coreg and losartan.  -Admit 08/16 with a/c CHF. Weight 199 lb at clinic f/u.  -Has received 40 mg lasix IV X 2 and 80 mg lasix IV this am. Diuresing well with IV lasix. Still appears overloaded. Continue IV lasix 80 mg BID -GDMT limited by hypotension and CKD. Records indicate he had been on midodrine at one point but he does not recall this. BP okay here.  -Cost previously limited use of [place]. Will review options with our HF Pharm D.   2. Pulmonary hypertension: -PASP 55 mmHg on echo this admit -RV okay -? Untreated sleep apnea  2. Permanent atrial fibrillation: -Rate controlled. Not on beta blocker d/t hypotension. On amio d/t VT. TSH okay. -Severe LAE noted on echo -Reports multiple cardioversions over the years, believes he has been in afib for over 10 years -Anticoagulated with Warfarin. INR 2.3. Will hold tonight for RHC. Access via right femoral, has right UE AV fistula, had left UE AV fistula in the past  3. CAD: -Hx CABG in '92 -Last LHC in 2019 in setting of VT with patent LIMA to LAD, patent SVG to PDA, moderate stenosis SVG  to ramus, occluded SVG to OM1 (medical therapy suggested d/t comorbidities and thrombocytopenia with need for anticoagulation) -Denies CP -Very mild troponin elevation on admit with flat trend. Likely d/t demand ischemia from a/c CHF. -Not on asa d/t history ITP and chronically anticoagulated. On statin.  4. Hx VT: -On amiodarone 200 daily -No VT so far on tele  5. Hx renal and liver transplant in  2008: -Failed renal transplant '97 -On prednisone, tacrolimus -Followed at [facility] for liver transplant, reports Dr. Moshe Cipro for renal   6. Stage IIIb CKD -Hx renal transplant as above -Monitor with diuresis -Scr 2.03 (baseline 1.9-2.1) -Avoid hypotension  7. Depression -Appears to be an ongoing issue   [facility] CM/CSW consult PT/OT consult - suspect he may need SNF for rehab   GOC: -Has been DNR -Reports poor quality of life over the last few years -May consider palliative care consult  Length of Stay: 1  FINCH, LINDSAY N, PA-C  06/24/2021, 10:18 AM  Advanced Heart Failure Team Pager [phone] (M-F; 7a - 5p)  Please contact [facility] for night-coverage after hours (4p -7a ) and weekends on [email or web address]   Patient seen with PA, agree with the above note.   Past history as outlined above.  Patient was admitted from Dr. Olin Pia clinic with acute on chronic HF with mid range EF.  Creatinine 2 which is about his baseline.   General: NAD Neck: JVP 14-16 cm, no thyromegaly or thyroid nodule.  Lungs: Clear to auscultation bilaterally with normal respiratory effort. CV: Nondisplaced PMI.  Heart regular S1/S2, no S3/S4, 1/6 SEM RUSB.  No peripheral edema.  No carotid bruit.  Normal pedal pulses.  Abdomen: Soft, nontender, no hepatosplenomegaly, no distention.  Skin: Intact without lesions or rashes.  Neurologic: Alert and oriented x 3.  Psych: Normal affect. Extremities: No clubbing or cyanosis.  HEENT: Normal.   1. Acute on chronic HF with mid range EF: Ischemic cardiomyopathy.   Echo this admission with EF 45-50%, moderate LVH, RV normal, PASP 55 mmHg. Recent admission in 6/22 with CHF.  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  Also concern for possible cardiac amyloidosis with LVH.  He is volume overloaded on exam.  Med titration has been limited by hypotension.  - Will hold warfarin today and plan RHC tomorrow (probably will not do hemodynamic LHC unless INR comes down).  Discussed risks/benefits with patient and he agrees to procedure.  - Will arrange for PYP scan and myeloma panel/urine immunofixation for amyloidosis workup.  - With CKD, will try to get him on SGLT2 inhibitor eventually.  - Lasix 80 mg IV bid and follow creatinine closely.  2. CKD: Stage 3b.  Creatinine 2 today which seems to be baseline.  History of renal transplantation.  - On tacrolimus and prednisone.  - Follow creatinine closely with diuresis.  3. VT: Patient has history of VT, maintained on amiodarone.  4. Atrial fibrillation: Permanent x years.  Unlikely to be able to regain NSR for any significant period of time.  - Continue warfarin (holding for cath tomorrow).  5. CAD: s/p CABG 1992.  Last LHC in 2019 in setting of VT with patent LIMA to LAD, patent SVG to PDA, moderate stenosis SVG to ramus, occluded SVG to OM1 (medical therapy suggested d/t comorbidities and thrombocytopenia with need for anticoagulation).  No chest pain.  Mildly elevated HS-TnI likely due to demand ischemia from volume overload.  - Continue statin.  - On warfarin so ASA not needed.  6. S/p liver transplant: followed at [facility].  7. Depression  Loralie Champagne 06/24/2021 3:14 PM

## 2021-06-25 ENCOUNTER — Encounter (HOSPITAL_COMMUNITY)
Admission: EM | Disposition: A | Discharge status: Home Health | Payer: Self-pay | Source: Home / Self Care | Attending: Cardiology

## 2021-06-25 ENCOUNTER — Encounter (HOSPITAL_COMMUNITY): Payer: Self-pay | Admitting: Cardiology

## 2021-06-25 DIAGNOSIS — I509 Heart failure, unspecified: Secondary | ICD-10-CM | POA: Diagnosis not present

## 2021-06-25 DIAGNOSIS — I5023 Acute on chronic systolic (congestive) heart failure: Secondary | ICD-10-CM | POA: Diagnosis not present

## 2021-06-25 HISTORY — PX: RIGHT HEART CATH: CATH118263

## 2021-06-25 LAB — CBC WITH DIFFERENTIAL/PLATELET
Abs Immature Granulocytes: 0.08 10*3/uL — ABNORMAL HIGH (ref 0.00–0.07)
Basophils Absolute: 0 10*3/uL (ref 0.0–0.1)
Basophils Relative: 0 %
Eosinophils Absolute: 0.1 10*3/uL (ref 0.0–0.5)
Eosinophils Relative: 2 %
HCT: 37.3 % — ABNORMAL LOW (ref 39.0–52.0)
Hemoglobin: 11.8 g/dL — ABNORMAL LOW (ref 13.0–17.0)
Immature Granulocytes: 2 %
Lymphocytes Relative: 17 %
Lymphs Abs: 0.9 10*3/uL (ref 0.7–4.0)
MCH: 27.9 pg (ref 26.0–34.0)
MCHC: 31.6 g/dL (ref 30.0–36.0)
MCV: 88.2 fL (ref 80.0–100.0)
Monocytes Absolute: 0.3 10*3/uL (ref 0.1–1.0)
Monocytes Relative: 5 %
Neutro Abs: 3.6 10*3/uL (ref 1.7–7.7)
Neutrophils Relative %: 74 %
Platelets: 76 10*3/uL — ABNORMAL LOW (ref 150–400)
RBC: 4.23 MIL/uL (ref 4.22–5.81)
RDW: 16.7 % — ABNORMAL HIGH (ref 11.5–15.5)
WBC: 5 10*3/uL (ref 4.0–10.5)
nRBC: 0.4 % — ABNORMAL HIGH (ref 0.0–0.2)

## 2021-06-25 LAB — POCT I-STAT EG7
Acid-Base Excess: 7 mmol/L — ABNORMAL HIGH (ref 0.0–2.0)
Acid-Base Excess: 7 mmol/L — ABNORMAL HIGH (ref 0.0–2.0)
Bicarbonate: 34.2 mmol/L — ABNORMAL HIGH (ref 20.0–28.0)
Bicarbonate: 34.4 mmol/L — ABNORMAL HIGH (ref 20.0–28.0)
Calcium, Ion: 1.27 mmol/L (ref 1.15–1.40)
Calcium, Ion: 1.28 mmol/L (ref 1.15–1.40)
HCT: 36 % — ABNORMAL LOW (ref 39.0–52.0)
HCT: 36 % — ABNORMAL LOW (ref 39.0–52.0)
Hemoglobin: 12.2 g/dL — ABNORMAL LOW (ref 13.0–17.0)
Hemoglobin: 12.2 g/dL — ABNORMAL LOW (ref 13.0–17.0)
O2 Saturation: 68 %
O2 Saturation: 69 %
Potassium: 3.8 mmol/L (ref 3.5–5.1)
Potassium: 3.8 mmol/L (ref 3.5–5.1)
Sodium: 143 mmol/L (ref 135–145)
Sodium: 143 mmol/L (ref 135–145)
TCO2: 36 mmol/L — ABNORMAL HIGH (ref 22–32)
TCO2: 36 mmol/L — ABNORMAL HIGH (ref 22–32)
pCO2, Ven: 63.6 mmHg — ABNORMAL HIGH (ref 44.0–60.0)
pCO2, Ven: 63.8 mmHg — ABNORMAL HIGH (ref 44.0–60.0)
pH, Ven: 7.338 (ref 7.250–7.430)
pH, Ven: 7.341 (ref 7.250–7.430)
pO2, Ven: 39 mmHg (ref 32.0–45.0)
pO2, Ven: 40 mmHg (ref 32.0–45.0)

## 2021-06-25 LAB — BASIC METABOLIC PANEL
Anion gap: 8 (ref 5–15)
BUN: 25 mg/dL — ABNORMAL HIGH (ref 8–23)
CO2: 33 mmol/L — ABNORMAL HIGH (ref 22–32)
Calcium: 9.5 mg/dL (ref 8.9–10.3)
Chloride: 100 mmol/L (ref 98–111)
Creatinine, Ser: 2.17 mg/dL — ABNORMAL HIGH (ref 0.61–1.24)
GFR, Estimated: 32 mL/min — ABNORMAL LOW (ref >60)
Glucose, Bld: 131 mg/dL — ABNORMAL HIGH (ref 70–99)
Potassium: 3.6 mmol/L (ref 3.5–5.1)
Sodium: 141 mmol/L (ref 135–145)

## 2021-06-25 LAB — PROTIME-INR
INR: 2.6 — ABNORMAL HIGH (ref 0.8–1.2)
Prothrombin Time: 27.6 seconds — ABNORMAL HIGH (ref 11.4–15.2)

## 2021-06-25 SURGERY — RIGHT HEART CATH
Anesthesia: LOCAL

## 2021-06-25 MED ORDER — FENTANYL CITRATE (PF) 100 MCG/2ML IJ SOLN
INTRAMUSCULAR | Status: AC
  Filled 2021-06-25: qty 2

## 2021-06-25 MED ORDER — HEPARIN (PORCINE) IN NACL 1000-0.9 UT/500ML-% IV SOLN
INTRAVENOUS | Status: AC
  Filled 2021-06-25: qty 500

## 2021-06-25 MED ORDER — FENTANYL CITRATE (PF) 100 MCG/2ML IJ SOLN
INTRAMUSCULAR | Status: DC | PRN
  Administered 2021-06-25: 25 ug via INTRAVENOUS

## 2021-06-25 MED ORDER — SODIUM CHLORIDE 0.9% FLUSH: 3.0000 mL | INTRAVENOUS | Status: DC | PRN

## 2021-06-25 MED ORDER — HEPARIN (PORCINE) IN NACL 1000-0.9 UT/500ML-% IV SOLN
INTRAVENOUS | Status: DC | PRN
  Administered 2021-06-25: 500 mL

## 2021-06-25 MED ORDER — LABETALOL HCL 5 MG/ML IV SOLN: 10.0000 mg | INTRAVENOUS | Status: AC | PRN

## 2021-06-25 MED ORDER — MIDAZOLAM HCL 2 MG/2ML IJ SOLN
INTRAMUSCULAR | Status: AC
  Filled 2021-06-25: qty 2

## 2021-06-25 MED ORDER — MIDAZOLAM HCL 2 MG/2ML IJ SOLN
INTRAMUSCULAR | Status: DC | PRN
  Administered 2021-06-25: 1 mg via INTRAVENOUS

## 2021-06-25 MED ORDER — WARFARIN SODIUM 2 MG PO TABS
2.0000 mg | ORAL_TABLET | ORAL | Status: DC
  Administered 2021-06-25 – 2021-06-26 (×2): 2 mg via ORAL
  Filled 2021-06-25 (×2): qty 1

## 2021-06-25 MED ORDER — HYDRALAZINE HCL 20 MG/ML IJ SOLN: 10.0000 mg | INTRAMUSCULAR | Status: AC | PRN

## 2021-06-25 MED ORDER — ACETAMINOPHEN 325 MG PO TABS: 650.0000 mg | ORAL_TABLET | ORAL | Status: DC | PRN

## 2021-06-25 MED ORDER — LIDOCAINE HCL (PF) 1 % IJ SOLN
INTRAMUSCULAR | Status: AC
  Filled 2021-06-25: qty 30

## 2021-06-25 MED ORDER — ONDANSETRON HCL 4 MG/2ML IJ SOLN: 4.0000 mg | Freq: Four times a day (QID) | INTRAMUSCULAR | Status: DC | PRN

## 2021-06-25 MED ORDER — SODIUM CHLORIDE 0.9 % IV SOLN: 250.0000 mL | INTRAVENOUS | Status: DC | PRN

## 2021-06-25 MED ORDER — LIDOCAINE HCL (PF) 1 % IJ SOLN
INTRAMUSCULAR | Status: DC | PRN
  Administered 2021-06-25: 15 mL via INTRADERMAL

## 2021-06-25 MED ORDER — SODIUM CHLORIDE 0.9% FLUSH
3.0000 mL | Freq: Two times a day (BID) | INTRAVENOUS | Status: DC
  Administered 2021-06-25 – 2021-06-26 (×2): 3 mL via INTRAVENOUS

## 2021-06-25 SURGICAL SUPPLY — 6 items
CATH SWAN GANZ 7F STRAIGHT (CATHETERS) ×2 IMPLANT
KIT HEART LEFT (KITS) ×2 IMPLANT
PACK CARDIAC CATHETERIZATION (CUSTOM PROCEDURE TRAY) ×2 IMPLANT
SHEATH GLIDE SLENDER 4/5FR (SHEATH) ×2 IMPLANT
SHEATH PINNACLE 7F 10CM (SHEATH) ×2 IMPLANT
TRANSDUCER W/STOPCOCK (MISCELLANEOUS) ×2 IMPLANT

## 2021-06-25 NOTE — Progress Notes (Signed)
OT Cancellation Note  Patient Details Name: [name] MRN: [id number] DOB: 18-[name]-1951   Cancelled Treatment:    Reason Eval/Treat Not Completed: Active bedrest order. Post cath lab.   Joeseph Amor OTR/L  Acute Rehab Services  [phone] office number [phone] pager number   Joeseph Amor 06/25/2021, 10:49 AM

## 2021-06-25 NOTE — Progress Notes (Signed)
PT Cancellation Note  Patient Details Name: [name] MRN: [id number] DOB: 04-May-1951   Cancelled Treatment:    Reason Eval/Treat Not Completed: Patient at procedure or test/unavailable, will follow-up for PT evaluation as schedule permits.  Mabeline Caras, PT, DPT Acute Rehabilitation Services  Pager [phone] Office [place] 06/25/2021, 7:41 AM

## 2021-06-25 NOTE — Progress Notes (Signed)
Hospitalist consult PROGRESS NOTE    [name]  LOV:[id number] DOB: November 01, 1951 DOA: 06/23/2021 PCP: [name]., MD    Chief Complaint  Patient presents with   Shortness of Breath    Brief Narrative:  [name] is an 70 y.o. male with history of chronic HFrEF, atrial fibrillation on warfarin, hx of liver/kidney transplant on tacrolimus/prednisone who was seen at his cardiologist office and found to be short of breath with orthopnea and tachypnea and was subsequently admitted onto the cardiologist service for acute heart failure exacerbation. Hospitalist were asked to consult for chronic medical conditions, nephrology also consulted due to h/o renal transplant  Subjective:  He is sitting up in chair, on room air, reports sob and edema has improved But reports feeling dizzy He denies chest pain  Assessment & Plan:   Principal Problem:   Acute on chronic systolic CHF (congestive heart failure) (HCC) Active Problems:   Idiopathic thrombocytopenia (HCC)   HTN (hypertension)   HLD (hyperlipidemia)   CAD (coronary artery disease)   Atrial fibrillation ([place])   Gout   History of liver transplant ([place])   History of kidney transplant   Acute respiratory failure with hypoxia ([place])   CKD (chronic kidney disease), stage III ([place])   Acute on chronic systolic CHF exacerbation, Right side pleural effusion, acute hypoxic respiratory failure -Initially O2 sats 85 to 86% on room air, required 2 L oxygen supplement -S/p right side cardiac cath this morning Management per cardiology, currently on Lasix IV 80 mg twice a day, appear improving, now on room air, no hypoxia Reported feeling dizzy, check orthostatic vital signs Undergoing work-up for amyloidosis  History of CAD status post CABG Denies chest pain On Lipitor  Chronic A. Fib, history of VT Currently on amiodarone, in A. Fib, rate controlled, also on Coumadin, INR 2.6 today  CKD 3B, history of liver  kidney transplant, immunosuppressed status Nephrology following, monitor creatinine On tacrolimus and prednisone  Chronic thrombocytopenia Appear at baseline No sign of bleeding  History of gout, continue allopurinol, stable no acute issues    Body mass index is 27.42 kg/m.[name]       Unresulted Labs (From admission, onward)     Start     Ordered   06/25/21 1710  Multiple Myeloma Panel (SPEP&IFE w/QIG)  Once,   R        06/25/21 1709   06/25/21 0500  CBC with Differential/Platelet  Daily,   R      06/24/21 2042   06/24/21 1503  Immunofixation electrophoresis  Once,   R        06/24/21 1503   06/24/21 1501  UPEP/UIFE/Light Chains/TP, 24-Hr Ur  Once,   R        06/24/21 1503   06/24/21 1500  Protein electrophoresis, serum  Once,   R        06/24/21 1503   06/24/21 8841  Basic metabolic panel  Daily,   R      06/23/21 2200   06/24/21 0500  Protime-INR  Daily,   R      06/23/21 2125              DVT prophylaxis:  warfarin (COUMADIN) tablet 2 mg  warfarin (COUMADIN) tablet 4 mg   Code Status: DNR Family Communication: Patient Disposition:   Status is: Inpatient  Dispo: The patient is from: Home              Anticipated d/c is to: Home  Anticipated d/c date is: To be determined by cardiology                Consultants:  Cardiology is primary Nephrology as consultant [facility] hospitalist is consulted  Procedures:  Right side Cardiac cath on 8/18  Antimicrobials:   none     Objective: Vitals:   06/25/21 1100 06/25/21 1110 06/25/21 1300 06/25/21 1500  BP:  127/64 122/75 117/69  Pulse: 77  70 88  Resp:   20 17  Temp:   98 F (36.7 C) 98.1 F (36.7 C)  TempSrc:   Oral Oral  SpO2: 97%  99% 100%  Weight:      Height:        Intake/Output Summary (Last 24 hours) at 06/25/2021 1759 Last data filed at 06/25/2021 1700 Gross per 24 hour  Intake 1016 ml  Output 1425 ml  Net -409 ml   Filed Weights   06/23/21 2201 06/25/21 0204  Weight:  87.6 kg 86.7 kg    Examination:  General exam: Appear weak ,chronically ill-appearing ,calm, NAD Respiratory system: Decreased breath sounds right lower lobe, left side with good air movement, no wheezing, no rales, no rhonchi, respiratory effort normal. Cardiovascular system: S1 & S2 heard, IRRR.  Gastrointestinal system: Protuberant abdomen, multiple well-healed surgical scar, nontender.  Normal bowel sounds heard. Central nervous system: Alert and oriented. No focal neurological deficits. Extremities: No edema Skin: No rashes, lesions or ulcers Psychiatry: Judgement and insight appear normal. Mood & affect appropriate.     Data Reviewed: I have personally reviewed following labs and imaging studies  CBC: Recent Labs  Lab 06/23/21 1707 06/25/21 0118 06/25/21 0810  WBC 5.5 5.0  --   NEUTROABS 4.4 3.6  --   HGB 13.4 11.8* 12.2*  12.2*  HCT 42.3 37.3* 36.0*  36.0*  MCV 89.2 88.2  --   PLT 73* 76*  --     Basic Metabolic Panel: Recent Labs  Lab 06/23/21 1707 06/24/21 0255 06/25/21 0118 06/25/21 0810  NA 139 139 141 143  143  K 3.8 3.4* 3.6 3.8  3.8  CL 101 101 100  --   CO2 29 31 33*  --   GLUCOSE 125* 117* 131*  --   BUN 24* 23 25*  --   CREATININE 1.91* 2.03* 2.17*  --   CALCIUM 9.5 9.4 9.5  --   MG  --  2.0  --   --     GFR: Estimated Creatinine Clearance: 33.6 mL/min (A) (by C-G formula based on SCr of 2.17 mg/dL (H)).  Liver Function Tests: Recent Labs  Lab 06/23/21 1707  AST 17  ALT 16  ALKPHOS 62  BILITOT 2.0*  PROT 7.0  ALBUMIN 3.5    CBG: No results for input(s): GLUCAP in the last 168 hours.   Recent Results (from the past 240 hour(s))  Resp Panel by RT-PCR (Flu A&B, Covid) Nasopharyngeal Swab     Status: None   Collection Time: 06/23/21  5:12 PM   Specimen: Nasopharyngeal Swab; Nasopharyngeal(NP) swabs in vial transport medium  Result Value Ref Range Status   SARS Coronavirus 2 by RT PCR NEGATIVE NEGATIVE Final    Comment:  (NOTE) SARS-CoV-2 target nucleic acids are NOT DETECTED.  The SARS-CoV-2 RNA is generally detectable in upper respiratory specimens during the acute phase of infection. The lowest concentration of SARS-CoV-2 viral copies this assay can detect is 138 copies/mL. A negative result does not preclude SARS-Cov-2 infection and should not be used  as the sole basis for treatment or other patient management decisions. A negative result may occur with  improper specimen collection/handling, submission of specimen other than nasopharyngeal swab, presence of viral mutation(s) within the areas targeted by this assay, and inadequate number of viral copies(<138 copies/mL). A negative result must be combined with clinical observations, patient history, and epidemiological information. The expected result is Negative.  Fact Sheet for Patients:  EntrepreneurPulse.com.au  Fact Sheet for Healthcare Providers:  IncredibleEmployment.be  This test is no t yet approved or cleared by the [place] FDA and  has been authorized for detection and/or diagnosis of SARS-CoV-2 by FDA under an Emergency Use Authorization (EUA). This EUA will remain  in effect (meaning this test can be used) for the duration of the COVID-19 declaration under Section 564(b)(1) of the Act, 21 U.S.C.section 360bbb-3(b)(1), unless the authorization is terminated  or revoked sooner.       Influenza A by PCR NEGATIVE NEGATIVE Final   Influenza B by PCR NEGATIVE NEGATIVE Final    Comment: (NOTE) The Xpert Xpress SARS-CoV-2/FLU/RSV plus assay is intended as an aid in the diagnosis of influenza from Nasopharyngeal swab specimens and should not be used as a sole basis for treatment. Nasal washings and aspirates are unacceptable for Xpert Xpress SARS-CoV-2/FLU/RSV testing.  Fact Sheet for Patients: EntrepreneurPulse.com.au  Fact Sheet for Healthcare  Providers: IncredibleEmployment.be  This test is not yet approved or cleared by the [place] FDA and has been authorized for detection and/or diagnosis of SARS-CoV-2 by FDA under an Emergency Use Authorization (EUA). This EUA will remain in effect (meaning this test can be used) for the duration of the COVID-19 declaration under Section 564(b)(1) of the Act, 21 U.S.C. section 360bbb-3(b)(1), unless the authorization is terminated or revoked.  Performed at [facility], [place] [address], [place], [place] [address]          Radiology Studies: CARDIAC CATHETERIZATION  Result Date: 06/25/2021 1. Elevated right and left heart filling pressures. 2. Primarily pulmonary venous hypertension. 3. Preserved cardiac output.   ECHOCARDIOGRAM COMPLETE  Result Date: 06/24/2021    ECHOCARDIOGRAM REPORT   Patient Name:   [name] Date of Exam: 06/24/2021 Medical Rec #:  [id number]          Height:       70.0 in Accession #:    [id number]         Weight:       193.1 lb Date of Birth:  10/29/1951          BSA:          2.056 m Patient Age:    89 years           BP:           135/108 mmHg Patient Gender: M                  HR:           77 bpm. Exam Location:  Inpatient Procedure: 2D Echo, Cardiac Doppler, Color Doppler and 3D Echo Indications:    CHF  History:        Patient has prior history of Echocardiogram examinations, most                 recent 08/06/2018.  Sonographer:    Merrie Roof RDCS Referring Phys: [place]  1. Inferoseptal and basal to mid inferior and posterolateral hypokinesis. Left ventricular ejection fraction, by estimation, is 45  to 50%. The left ventricle has mildly decreased function. The left ventricle demonstrates regional wall motion abnormalities (see scoring diagram/findings for description). There is moderate concentric left ventricular hypertrophy. Left ventricular diastolic parameters are indeterminate.  2. Right  ventricular systolic function is normal. The right ventricular size is normal. There is moderately elevated pulmonary artery systolic pressure.  3. Left atrial size was severely dilated.  4. The mitral valve is normal in structure. Trivial mitral valve regurgitation. No evidence of mitral stenosis.  5. The aortic valve is tricuspid. Aortic valve regurgitation is mild. No aortic stenosis is present. Aortic regurgitation PHT measures 504 msec. Aortic valve area, by VTI measures 1.81 cm. Aortic valve mean gradient measures 7.0 mmHg. Aortic valve Vmax  measures 1.95 m/s.  6. The inferior vena cava is normal in size with greater than 50% respiratory variability, suggesting right atrial pressure of 3 mmHg. FINDINGS  Left Ventricle: Inferoseptal and basal to mid inferior and posterolateral hypokinesis. Left ventricular ejection fraction, by estimation, is 45 to 50%. The left ventricle has mildly decreased function. The left ventricle demonstrates regional wall motion abnormalities. The left ventricular internal cavity size was normal in size. There is moderate concentric left ventricular hypertrophy. Left ventricular diastolic parameters are indeterminate. Right Ventricle: The right ventricular size is normal. No increase in right ventricular wall thickness. Right ventricular systolic function is normal. There is moderately elevated pulmonary artery systolic pressure. The tricuspid regurgitant velocity is 3.62 m/s, and with an assumed right atrial pressure of 3 mmHg, the estimated right ventricular systolic pressure is 88.4 mmHg. Left Atrium: Left atrial size was severely dilated. Right Atrium: Right atrial size was normal in size. Pericardium: There is no evidence of pericardial effusion. Mitral Valve: The mitral valve is normal in structure. Trivial mitral valve regurgitation. No evidence of mitral valve stenosis. Tricuspid Valve: The tricuspid valve is normal in structure. Tricuspid valve regurgitation is mild . No  evidence of tricuspid stenosis. Aortic Valve: The aortic valve is tricuspid. Aortic valve regurgitation is mild. Aortic regurgitation PHT measures 504 msec. No aortic stenosis is present. Aortic valve mean gradient measures 7.0 mmHg. Aortic valve peak gradient measures 15.2 mmHg. Aortic valve area, by VTI measures 1.81 cm. Pulmonic Valve: The pulmonic valve was normal in structure. Pulmonic valve regurgitation is trivial. No evidence of pulmonic stenosis. Aorta: The aortic root is normal in size and structure. Venous: The inferior vena cava is normal in size with greater than 50% respiratory variability, suggesting right atrial pressure of 3 mmHg. IAS/Shunts: No atrial level shunt detected by color flow Doppler.  LEFT VENTRICLE PLAX 2D LVIDd:         5.80 cm LVIDs:         3.80 cm LV PW:         1.30 cm LV IVS:        1.40 cm LVOT diam:     2.00 cm  3D Volume EF: LV SV:         64       3D EF:        56 % LV SV Index:   31       LV EDV:       192 ml LVOT Area:     3.14 cm LV ESV:       85 ml                         LV SV:  108 ml RIGHT VENTRICLE RV Basal diam:  3.30 cm RV S prime:     7.07 cm/s TAPSE (M-mode): 1.3 cm LEFT ATRIUM              Index       RIGHT ATRIUM           Index LA diam:        6.10 cm  2.97 cm/m  RA Area:     17.30 cm LA Vol (A2C):   110.0 ml 53.49 ml/m RA Volume:   47.20 ml  22.95 ml/m LA Vol (A4C):   130.0 ml 63.22 ml/m LA Biplane Vol: 130.0 ml 63.22 ml/m  AORTIC VALVE AV Area (Vmax):    1.63 cm AV Area (Vmean):   1.83 cm AV Area (VTI):     1.81 cm AV Vmax:           195.00 cm/s AV Vmean:          119.000 cm/s AV VTI:            0.356 m AV Peak Grad:      15.2 mmHg AV Mean Grad:      7.0 mmHg LVOT Vmax:         101.00 cm/s LVOT Vmean:        69.500 cm/s LVOT VTI:          0.205 m LVOT/AV VTI ratio: 0.58 AI PHT:            504 msec  AORTA Ao Root diam: 3.10 cm TRICUSPID VALVE TR Peak grad:   52.4 mmHg TR Vmax:        362.00 cm/s  SHUNTS Systemic VTI:  0.20 m Systemic Diam: 2.00  cm Martin Latch MD Electronically signed by Martin Latch MD Signature Date/Time: 06/24/2021/12:24:55 PM    Final         Scheduled Meds:  allopurinol  100 mg Oral Daily   amiodarone  200 mg Oral Daily   atorvastatin  20 mg Oral QHS   calcitRIOL  0.25 mcg Oral Daily   DULoxetine  60 mg Oral QHS   ferrous sulfate  325 mg Oral Q breakfast   furosemide  80 mg Intravenous BID   magnesium oxide  400 mg Oral BID   melatonin  1.5 mg Oral QHS   omega-3 acid ethyl esters  2 g Oral Daily   potassium chloride SA  40 mEq Oral BID   predniSONE  5 mg Oral Daily   QUEtiapine  150 mg Oral QHS   sodium chloride flush  3 mL Intravenous Q12H   sodium chloride flush  3 mL Intravenous Q12H   tacrolimus  0.5 mg Oral QODAY   tacrolimus  1 mg Oral QODAY   vitamin B-12  500 mcg Oral Daily   warfarin  2 mg Oral Once per day on Sun Mon Wed Thu Fri Sat   [START ON 06/30/2021] warfarin  4 mg Oral Once per day on Tue   Warfarin - Pharmacist Dosing Inpatient   Does not apply q1600   Continuous Infusions:  sodium chloride     sodium chloride       LOS: 2 days   Time spent: 40mns Greater than 50% of this time was spent in counseling, explanation of diagnosis, planning of further management, and coordination of care.   Voice Recognition /Viviann Sparedictation system was used to create this note, attempts have been made to correct errors. Please contact the author with questions  and/or clarifications.   Florencia Reasons, MD PhD FACP [facility]  Available via Epic secure chat 7am-7pm for nonurgent issues Please page for urgent issues To page the attending provider between 7A-7P or the covering provider during after hours 7P-7A, please log into the web site [email or web address] and access using universal [place] password for that web site. If you do not have the password, please call the hospital operator.    06/25/2021, 5:59 PM

## 2021-06-25 NOTE — Interval H&P Note (Signed)
History and Physical Interval Note:  06/25/2021 7:49 AM  [name]  has presented today for surgery, with the diagnosis of heart failure.  The various methods of treatment have been discussed with the patient and family. After consideration of risks, benefits and other options for treatment, the patient has consented to  Procedure(s): RIGHT HEART CATH (N/A) as a surgical intervention.  The patient's history has been reviewed, patient examined, no change in status, stable for surgery.  I have reviewed the patient's chart and labs.  Questions were answered to the patient's satisfaction.     Dima Mini [employer]

## 2021-06-25 NOTE — Progress Notes (Signed)
PT Cancellation Note  Patient Details Name: [name] MRN: [id number] DOB: [birth year]   Cancelled Treatment:    Reason Eval/Treat Not Completed: Active bedrest order post-cardiac cath. Will follow-up this afternoon for PT evaluation as schedule permits.  Mabeline Caras, PT, DPT Acute Rehabilitation Services  Pager [phone] Office [place] 06/25/2021, 10:43 AM

## 2021-06-25 NOTE — Progress Notes (Signed)
Patient ID: [name], male   DOB: 10-Aug-1951, 70 y.o.   MRN: [id number]  [facility] Progress Note   Assessment/ Plan:   1.  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  Slight rise of creatinine seen this morning on labs but still within his baseline.  He does not have any acute electrolyte abnormalities to prompt intervention and Prograf level checked this morning. 2.  Acute exacerbation of congestive heart failure: With a history of congestive heart failure with reduced ejection fraction.  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  Right heart catheterization shows primarily pulmonary venous hypertension with elevated right and left heart filling pressures and preserved cardiac output.  Adjustments of diuretic therapy per cardiology service. 3.  Hypokalemia: Mild and secondary to losses with ongoing diuretic therapy and improving with oral supplementation 4.  Hypertension: Blood pressure under decent control, continue to monitor with ongoing diuresis. 5.  History of orthotopic liver transplant (simultaneous liver/kidney in 2008): Maintenance immunosuppression with tacrolimus and prednisone. 6.  Permanent atrial fibrillation: Currently rate controlled and on amiodarone secondary to a history of ventricular tachycardia  Subjective:   Reports some improvement of his shortness of breath overnight and complains of feeling thirsty this morning.  Just came back from right heart catheterization.   Objective:   BP 134/73   Pulse (!) 295   Temp 98.3 F (36.8 C) (Oral)   Resp (!) 0   Ht 5\' 10"  (1.778 m)   Wt 86.7 kg   SpO2 (!) 0%   BMI 27.42 kg/m   Intake/Output Summary (Last 24 hours) at 06/25/2021 0850 Last data filed at 06/24/2021 2130 Gross per 24 hour  Intake 963 ml   Output 2400 ml  Net -1437 ml   Weight change: -0.907 kg  Physical Exam: Gen: Appears dyspneic with abdominal breathing resting flat in bed. CVS: Pulse regular rhythm normal rate, S1 and S2 normal Resp: Decreased breath sounds over bases but no distinct rales or rhonchi Abd: Soft, moderate distention without tenderness, normal bowel sounds Ext: No lower extremity edema  Imaging: CARDIAC CATHETERIZATION  Result Date: 06/25/2021 1. Elevated right and left heart filling pressures. 2. Primarily pulmonary venous hypertension. 3. Preserved cardiac output.   DG Chest Port 1 View  Result Date: 06/23/2021 CLINICAL DATA:  Shortness of breath EXAM: PORTABLE CHEST 1 VIEW COMPARISON:  Chest radiograph 08/29/2020 FINDINGS: The heart is significantly enlarged, similar to the prior study. The mediastinal contours are stable. Median sternotomy wires and mediastinal surgical clips are stable. There are increased interstitial markings bilaterally. There are patchy opacities in the right base with a small right pleural effusion. The left costophrenic angle is cut off; a left pleural effusion cannot be excluded. The left upper lung is well aerated. There is no pneumothorax. The bones are unremarkable. IMPRESSION: 1. Small right pleural effusion with adjacent airspace disease which may reflect atelectasis or pneumonia. Recommend follow-up radiographs in 6-8 weeks to assess for resolution. 2. Cardiomegaly with mild pulmonary interstitial edema. 3. A left pleural effusion cannot be excluded as the costophrenic angle is not included in the field of view. Electronically Signed   By: Valetta Mole M.D.   On: 06/23/2021 16:49   ECHOCARDIOGRAM COMPLETE  Result Date: 06/24/2021    ECHOCARDIOGRAM REPORT   Patient Name:   [name] Date of Exam: 06/24/2021  Medical Rec #:  [id number]          Height:       70.0 in Accession #:    [id number]         Weight:       193.1 lb Date of Birth:  1951/07/26          BSA:           2.056 m Patient Age:    22 years           BP:           135/108 mmHg Patient Gender: M                  HR:           77 bpm. Exam Location:  Inpatient Procedure: 2D Echo, Cardiac Doppler, Color Doppler and 3D Echo Indications:    CHF  History:        Patient has prior history of Echocardiogram examinations, most                 recent 08/06/2018.  Sonographer:    Merrie Roof RDCS Referring Phys: [place]  1. Inferoseptal and basal to mid inferior and posterolateral hypokinesis. Left ventricular ejection fraction, by estimation, is 45 to 50%. The left ventricle has mildly decreased function. The left ventricle demonstrates regional wall motion abnormalities (see scoring diagram/findings for description). There is moderate concentric left ventricular hypertrophy. Left ventricular diastolic parameters are indeterminate.  2. Right ventricular systolic function is normal. The right ventricular size is normal. There is moderately elevated pulmonary artery systolic pressure.  3. Left atrial size was severely dilated.  4. The mitral valve is normal in structure. Trivial mitral valve regurgitation. No evidence of mitral stenosis.  5. The aortic valve is tricuspid. Aortic valve regurgitation is mild. No aortic stenosis is present. Aortic regurgitation PHT measures 504 msec. Aortic valve area, by VTI measures 1.81 cm. Aortic valve mean gradient measures 7.0 mmHg. Aortic valve Vmax  measures 1.95 m/s.  6. The inferior vena cava is normal in size with greater than 50% respiratory variability, suggesting right atrial pressure of 3 mmHg. FINDINGS  Left Ventricle: Inferoseptal and basal to mid inferior and posterolateral hypokinesis. Left ventricular ejection fraction, by estimation, is 45 to 50%. The left ventricle has mildly decreased function. The left ventricle demonstrates regional wall motion abnormalities. The left ventricular internal cavity size was normal in size. There is moderate concentric left  ventricular hypertrophy. Left ventricular diastolic parameters are indeterminate. Right Ventricle: The right ventricular size is normal. No increase in right ventricular wall thickness. Right ventricular systolic function is normal. There is moderately elevated pulmonary artery systolic pressure. The tricuspid regurgitant velocity is 3.62 m/s, and with an assumed right atrial pressure of 3 mmHg, the estimated right ventricular systolic pressure is 37.9 mmHg. Left Atrium: Left atrial size was severely dilated. Right Atrium: Right atrial size was normal in size. Pericardium: There is no evidence of pericardial effusion. Mitral Valve: The mitral valve is normal in structure. Trivial mitral valve regurgitation. No evidence of mitral valve stenosis. Tricuspid Valve: The tricuspid valve is normal in structure. Tricuspid valve regurgitation is mild . No evidence of tricuspid stenosis. Aortic Valve: The aortic valve is tricuspid. Aortic valve regurgitation is mild. Aortic regurgitation PHT measures 504 msec. No aortic stenosis is present. Aortic valve mean gradient measures 7.0 mmHg. Aortic valve peak gradient measures 15.2 mmHg. Aortic valve area, by VTI measures 1.81  cm. Pulmonic Valve: The pulmonic valve was normal in structure. Pulmonic valve regurgitation is trivial. No evidence of pulmonic stenosis. Aorta: The aortic root is normal in size and structure. Venous: The inferior vena cava is normal in size with greater than 50% respiratory variability, suggesting right atrial pressure of 3 mmHg. IAS/Shunts: No atrial level shunt detected by color flow Doppler.  LEFT VENTRICLE PLAX 2D LVIDd:         5.80 cm LVIDs:         3.80 cm LV PW:         1.30 cm LV IVS:        1.40 cm LVOT diam:     2.00 cm  3D Volume EF: LV SV:         64       3D EF:        56 % LV SV Index:   31       LV EDV:       192 ml LVOT Area:     3.14 cm LV ESV:       85 ml                         LV SV:        108 ml RIGHT VENTRICLE RV Basal diam:  3.30  cm RV S prime:     7.07 cm/s TAPSE (M-mode): 1.3 cm LEFT ATRIUM              Index       RIGHT ATRIUM           Index LA diam:        6.10 cm  2.97 cm/m  RA Area:     17.30 cm LA Vol (A2C):   110.0 ml 53.49 ml/m RA Volume:   47.20 ml  22.95 ml/m LA Vol (A4C):   130.0 ml 63.22 ml/m LA Biplane Vol: 130.0 ml 63.22 ml/m  AORTIC VALVE AV Area (Vmax):    1.63 cm AV Area (Vmean):   1.83 cm AV Area (VTI):     1.81 cm AV Vmax:           195.00 cm/s AV Vmean:          119.000 cm/s AV VTI:            0.356 m AV Peak Grad:      15.2 mmHg AV Mean Grad:      7.0 mmHg LVOT Vmax:         101.00 cm/s LVOT Vmean:        69.500 cm/s LVOT VTI:          0.205 m LVOT/AV VTI ratio: 0.58 AI PHT:            504 msec  AORTA Ao Root diam: 3.10 cm TRICUSPID VALVE TR Peak grad:   52.4 mmHg TR Vmax:        362.00 cm/s  SHUNTS Systemic VTI:  0.20 m Systemic Diam: 2.00 cm Skeet Latch MD Electronically signed by Skeet Latch MD Signature Date/Time: 06/24/2021/12:24:55 PM    Final     Labs: BMET Recent Labs  Lab 06/23/21 1707 06/24/21 0255 06/25/21 0118  NA 139 139 141  K 3.8 3.4* 3.6  CL 101 101 100  CO2 29 31 33*  GLUCOSE 125* 117* 131*  BUN 24* 23 25*  CREATININE 1.91* 2.03* 2.17*  CALCIUM 9.5 9.4 9.5   CBC Recent  Labs  Lab 06/23/21 1707 06/25/21 0118  WBC 5.5 5.0  NEUTROABS 4.4 3.6  HGB 13.4 11.8*  HCT 42.3 37.3*  MCV 89.2 88.2  PLT 73* 76*   Medications:     allopurinol  100 mg Oral Daily   amiodarone  200 mg Oral Daily   atorvastatin  20 mg Oral QHS   calcitRIOL  0.25 mcg Oral Daily   DULoxetine  60 mg Oral QHS   ferrous sulfate  325 mg Oral Q breakfast   furosemide  80 mg Intravenous BID   magnesium oxide  400 mg Oral BID   melatonin  1.5 mg Oral QHS   omega-3 acid ethyl esters  2 g Oral Daily   potassium chloride SA  40 mEq Oral BID   predniSONE  5 mg Oral Daily   QUEtiapine  150 mg Oral QHS   sodium chloride flush  3 mL Intravenous Q12H   tacrolimus  0.5 mg Oral QODAY    tacrolimus  1 mg Oral QODAY   vitamin B-12  500 mcg Oral Daily   [START ON 06/30/2021] warfarin  4 mg Oral Once per day on Tue   Warfarin - Pharmacist Dosing Inpatient   Does not apply q1600   Elmarie Shiley, MD 06/25/2021, 8:50 AM

## 2021-06-25 NOTE — Progress Notes (Signed)
ANTICOAGULATION CONSULT NOTE - Follow Up Consult  Pharmacy Consult for Warfarin Indication: atrial fibrillation  Allergies  Allergen Reactions   Other Anaphylaxis    Spider venom   Grapefruit Concentrate Other (See Comments)    Pt has been told not to eat grapefruit   Haloperidol Other (See Comments)    Caused the patient to be overly-sedated   Nsaids Other (See Comments)    Cannot take due to liver transplant   Rifampin Other (See Comments)    Caused flushing   Triazolam Other (See Comments)    Caused the patient to be overly-sedated    Patient Measurements: Height: 5\' 10"  (177.8 cm) Weight: 86.7 kg (191 lb 1.6 oz) IBW/kg (Calculated) : 73  Vital Signs: Temp: 98 F (36.7 C) (08/18 0850) Temp Source: Oral (08/18 0850) BP: 134/73 (08/18 0827) Pulse Rate: 84 (08/18 0850)  Labs: Recent Labs    06/23/21 1707 06/23/21 1822 06/24/21 0255 06/25/21 0118  HGB 13.4  --   --  11.8*  HCT 42.3  --   --  37.3*  PLT 73*  --   --  76*  LABPROT 26.6*  --  25.1* 27.6*  INR 2.5*  --  2.3* 2.6*  CREATININE 1.91*  --  2.03* 2.17*  TROPONINIHS 28* 27*  --   --      Estimated Creatinine Clearance: 33.6 mL/min (A) (by C-G formula based on SCr of 2.17 mg/dL (H)).   Assessment:  Anticoag: Warfarin for Afib  H/H 13.4>11.8, Plt 76 low but stable. INR 2.6 - PTA dose: 4mg  Tues, 2mg  all other days  Goal of Therapy:  INR 2-3 Monitor platelets by anticoagulation protocol: Yes   Plan:  -Warfarin 4mg  Tues, 2mg  all other days--resumed home dosing -Monitor daily PT/INR    [name] S. [name], PharmD, BCPS Clinical Staff Pharmacist [email or web address]  [name], [name] 06/25/2021,9:11 AM

## 2021-06-25 NOTE — Evaluation (Signed)
Physical Therapy Evaluation & Discharge Patient Details Name: [name] MRN: [id number] DOB: 10-14-1951 Today's Date: 06/25/2021   History of Present Illness  Pt is a 70 y.o. male admitted 06/23/21 with volume overload, admitted for diuresis and possible inotropic support. Workup for CHF exacerbation. S/p cardiac cath 8/18. PMH includes CAD (s/p CABG 1992), ESRD, HTN, HLD, afib.   Clinical Impression  Patient evaluated by Physical Therapy with no further acute PT needs identified. PTA, pt independent, lives alone, enjoys dealing antiques. Today, pt independent with mobility and ADL tasks; reports feeling "lightheaded" with ambulation which is his baseline (see vitals below; RN aware). All education has been completed and the patient has no further questions. Acute PT is signing off. Thank you for this referral.  SpO2 91-98% on RA HR 70s-80s  Sitting BP 121/64 Post-ambulation BP 133/100    Follow Up Recommendations No PT follow up    Equipment Recommendations  None recommended by PT    Recommendations for Other Services       Precautions / Restrictions Precautions Precautions: Other (comment) Precaution Comments: s/p cardiac cath via femoral access this AM Restrictions Weight Bearing Restrictions: No      Mobility  Bed Mobility Overal bed mobility: Independent             General bed mobility comments: bed flat    Transfers Overall transfer level: Independent Equipment used: None                Ambulation/Gait Ambulation/Gait assistance: Independent Gait Distance (Feet): 40 Feet Assistive device: None Gait Pattern/deviations: Step-through pattern;Decreased stride length     General Gait Details: Slow, steady gait without DME; pt reports feeling "lightheaded" which is "normal for me"; no overt instability or LOB, but deferred further distance secondary to this (RN aware)  Stairs            Wheelchair Mobility    Modified Rankin  (Stroke Patients Only)       Balance Overall balance assessment: No apparent balance deficits (not formally assessed)   Sitting balance-Leahy Scale: Good Sitting balance - Comments: indep with pericare/toileting     Standing balance-Leahy Scale: Good Standing balance comment: indep ambulation and standing ADL tasks without UE support                             Pertinent Vitals/Pain Pain Assessment: No/denies pain    Home Living Family/patient expects to be discharged to:: Private residence Living Arrangements: Alone Available Help at Discharge: Family;Friend(s);Available PRN/intermittently Type of Home: House Home Access: Stairs to enter Entrance Stairs-Rails: Left Entrance Stairs-Number of Steps: 7-8 Home Layout: One level Home Equipment: None      Prior Function Level of Independence: Independent         Comments: Independent; Financial trader; drives; retired from work     Journalist, newspaper        Extremity/Trunk Assessment   Upper Extremity Assessment Upper Extremity Assessment: Overall WFL for tasks assessed    Lower Extremity Assessment Lower Extremity Assessment: Overall WFL for tasks assessed    Cervical / Trunk Assessment Cervical / Trunk Assessment: Normal  Communication   Communication: No difficulties  Cognition Arousal/Alertness: Awake/alert Behavior During Therapy: WFL for tasks assessed/performed Overall Cognitive Status: Within Functional Limits for tasks assessed  General Comments General comments (skin integrity, edema, etc.): HR up to 133/100 with mobility (RN present and aware); SpO2 91% on RA, RN replacing 1L O2 [place]. Educ pt re: activity recommendations, importance of mobility    Exercises     Assessment/Plan    PT Assessment Patent does not need any further PT services  PT Problem List         PT Treatment Interventions      PT Goals (Current goals can be  found in the Care Plan section)  Acute Rehab PT Goals PT Goal Formulation: All assessment and education complete, DC therapy    Frequency     Barriers to discharge        Co-evaluation               AM-PAC PT "6 Clicks" Mobility  Outcome Measure Help needed turning from your back to your side while in a flat bed without using bedrails?: None Help needed moving from lying on your back to sitting on the side of a flat bed without using bedrails?: None Help needed moving to and from a bed to a chair (including a wheelchair)?: None Help needed standing up from a chair using your arms (e.g., wheelchair or bedside chair)?: None Help needed to walk in hospital room?: None Help needed climbing 3-5 steps with a railing? : None 6 Click Score: 24    End of Session   Activity Tolerance: Patient tolerated treatment well Patient left: in chair;with call bell/phone within reach;with nursing/sitter in room Nurse Communication: Mobility status (RN ok with no chair alarm) PT Visit Diagnosis: Other abnormalities of gait and mobility (R26.89)    Time: 1405-1430 PT Time Calculation (min) (ACUTE ONLY): 25 min   Charges:   PT Evaluation $PT Eval Moderate Complexity: [place], PT, DPT Acute Rehabilitation Services  Pager [phone] Office [place] 06/25/2021, 2:59 PM

## 2021-06-25 NOTE — Interval H&P Note (Signed)
History and Physical Interval Note:  06/25/2021 7:48 AM  [name]  has presented today for surgery, with the diagnosis of heart failure.  The various methods of treatment have been discussed with the patient and family. After consideration of risks, benefits and other options for treatment, the patient has consented to  Procedure(s): RIGHT HEART CATH (N/A) as a surgical intervention.  The patient's history has been reviewed, patient examined, no change in status, stable for surgery.  I have reviewed the patient's chart and labs.  Questions were answered to the patient's satisfaction.     [name]

## 2021-06-25 NOTE — Progress Notes (Addendum)
Patient ID: [name], male   DOB: 1951-03-26, 70 y.o.   MRN: [id number]     Advanced Heart Failure Rounding Note  PCP-Cardiologist: None   Subjective:    Good diuresis yesterday.  Creatinine mildly higher at 2.17.  Still dyspneic.   RHC Procedural Findings: Hemodynamics (mmHg):  RA mean 13 RV 53/15 PA 63/23, mean 39 PCWP mean 20 Oxygen saturations: PA 69% AO 96% Cardiac Output (Fick) 6.3  Cardiac Index (Fick) 3.07 PVR 3 WU Cardiac Output (Thermo) 5.62 Cardiac Index (Thermo) 2.74  PVR 3.4 WU    Objective:   Weight Range: 86.7 kg Body mass index is 27.42 kg/m.   Vital Signs:   Temp:  [98.3 F (36.8 C)-98.6 F (37 C)] 98.3 F (36.8 C) (08/18 0204) Pulse Rate:  [72-153] 72 (08/18 0204) Resp:  [16-33] 19 (08/18 0204) BP: (93-128)/(53-106) 93/60 (08/18 0204) SpO2:  [90 %-99 %] 99 % (08/18 0204) Weight:  [86.7 kg] 86.7 kg (08/18 0204) Last BM Date: 06/23/21  Weight change: Filed Weights   06/23/21 2201 06/25/21 0204  Weight: 87.6 kg 86.7 kg    Intake/Output:   Intake/Output Summary (Last 24 hours) at 06/25/2021 0826 Last data filed at 06/24/2021 2130 Gross per 24 hour  Intake 963 ml  Output 2400 ml  Net -1437 ml      Physical Exam    General:  Well appearing. No resp difficulty HEENT: Normal Neck: Supple. JVP 12-14 cm. Carotids 2+ bilat; no bruits. No lymphadenopathy or thyromegaly appreciated. Cor: PMI nondisplaced. Irregular rate & rhythm. No rubs, gallops. 1/6 SEM RUSB.  Lungs: Clear Abdomen: Soft, nontender, nondistended. No hepatosplenomegaly. No bruits or masses. Good bowel sounds. Extremities: No cyanosis, clubbing, rash, edema Neuro: Alert & orientedx3, cranial nerves grossly intact. moves all 4 extremities w/o difficulty. Affect pleasant   Telemetry   Atrial fibrillation rate 70s, personally reviewed  Labs    CBC Recent Labs    06/23/21 1707 06/25/21 0118  WBC 5.5 5.0  NEUTROABS 4.4 3.6  HGB 13.4 11.8*  HCT 42.3 37.3*   MCV 89.2 88.2  PLT 73* 76*   Basic Metabolic Panel Recent Labs    06/24/21 0255 06/25/21 0118  NA 139 141  K 3.4* 3.6  CL 101 100  CO2 31 33*  GLUCOSE 117* 131*  BUN 23 25*  CREATININE 2.03* 2.17*  CALCIUM 9.4 9.5  MG 2.0  --    Liver Function Tests Recent Labs    06/23/21 1707  AST 17  ALT 16  ALKPHOS 62  BILITOT 2.0*  PROT 7.0  ALBUMIN 3.5   No results for input(s): LIPASE, AMYLASE in the last 72 hours. Cardiac Enzymes No results for input(s): CKTOTAL, CKMB, CKMBINDEX, TROPONINI in the last 72 hours.  BNP: BNP (last 3 results) Recent Labs    06/23/21 1707  BNP 186.0*    ProBNP (last 3 results) No results for input(s): PROBNP in the last 8760 hours.   D-Dimer No results for input(s): DDIMER in the last 72 hours. Hemoglobin A1C No results for input(s): HGBA1C in the last 72 hours. Fasting Lipid Panel No results for input(s): CHOL, HDL, LDLCALC, TRIG, CHOLHDL, LDLDIRECT in the last 72 hours. Thyroid Function Tests Recent Labs    06/24/21 0255  TSH 1.789    Other results:   Imaging    CARDIAC CATHETERIZATION  Result Date: 06/25/2021 1. Elevated right and left heart filling pressures. 2. Primarily pulmonary venous hypertension. 3. Preserved cardiac output.   ECHOCARDIOGRAM COMPLETE  Result  Date: 06/24/2021    ECHOCARDIOGRAM REPORT   Patient Name:   [name] Date of Exam: 06/24/2021 Medical Rec #:  [id number]          Height:       70.0 in Accession #:    [id number]         Weight:       193.1 lb Date of Birth:  1951-06-02          BSA:          2.056 m Patient Age:    40 years           BP:           135/108 mmHg Patient Gender: M                  HR:           77 bpm. Exam Location:  Inpatient Procedure: 2D Echo, Cardiac Doppler, Color Doppler and 3D Echo Indications:    CHF  History:        Patient has prior history of Echocardiogram examinations, most                 recent 08/06/2018.  Sonographer:    Merrie Roof RDCS Referring Phys: Dw[place]  1. Inferoseptal and basal to mid inferior and posterolateral hypokinesis. Left ventricular ejection fraction, by estimation, is 45 to 50%. The left ventricle has mildly decreased function. The left ventricle demonstrates regional wall motion abnormalities (see scoring diagram/findings for description). There is moderate concentric left ventricular hypertrophy. Left ventricular diastolic parameters are indeterminate.  2. Right ventricular systolic function is normal. The right ventricular size is normal. There is moderately elevated pulmonary artery systolic pressure.  3. Left atrial size was severely dilated.  4. The mitral valve is normal in structure. Trivial mitral valve regurgitation. No evidence of mitral stenosis.  5. The aortic valve is tricuspid. Aortic valve regurgitation is mild. No aortic stenosis is present. Aortic regurgitation PHT measures 504 msec. Aortic valve area, by VTI measures 1.81 cm. Aortic valve mean gradient measures 7.0 mmHg. Aortic valve Vmax  measures 1.95 m/s.  6. The inferior vena cava is normal in size with greater than 50% respiratory variability, suggesting right atrial pressure of 3 mmHg. FINDINGS  Left Ventricle: Inferoseptal and basal to mid inferior and posterolateral hypokinesis. Left ventricular ejection fraction, by estimation, is 45 to 50%. The left ventricle has mildly decreased function. The left ventricle demonstrates regional wall motion abnormalities. The left ventricular internal cavity size was normal in size. There is moderate concentric left ventricular hypertrophy. Left ventricular diastolic parameters are indeterminate. Right Ventricle: The right ventricular size is normal. No increase in right ventricular wall thickness. Right ventricular systolic function is normal. There is moderately elevated pulmonary artery systolic pressure. The tricuspid regurgitant velocity is 3.62 m/s, and with an assumed right atrial pressure of 3 mmHg, the  estimated right ventricular systolic pressure is 85.6 mmHg. Left Atrium: Left atrial size was severely dilated. Right Atrium: Right atrial size was normal in size. Pericardium: There is no evidence of pericardial effusion. Mitral Valve: The mitral valve is normal in structure. Trivial mitral valve regurgitation. No evidence of mitral valve stenosis. Tricuspid Valve: The tricuspid valve is normal in structure. Tricuspid valve regurgitation is mild . No evidence of tricuspid stenosis. Aortic Valve: The aortic valve is tricuspid. Aortic valve regurgitation is mild. Aortic regurgitation PHT measures 504 msec. No aortic stenosis is present. Aortic  valve mean gradient measures 7.0 mmHg. Aortic valve peak gradient measures 15.2 mmHg. Aortic valve area, by VTI measures 1.81 cm. Pulmonic Valve: The pulmonic valve was normal in structure. Pulmonic valve regurgitation is trivial. No evidence of pulmonic stenosis. Aorta: The aortic root is normal in size and structure. Venous: The inferior vena cava is normal in size with greater than 50% respiratory variability, suggesting right atrial pressure of 3 mmHg. IAS/Shunts: No atrial level shunt detected by color flow Doppler.  LEFT VENTRICLE PLAX 2D LVIDd:         5.80 cm LVIDs:         3.80 cm LV PW:         1.30 cm LV IVS:        1.40 cm LVOT diam:     2.00 cm  3D Volume EF: LV SV:         64       3D EF:        56 % LV SV Index:   31       LV EDV:       192 ml LVOT Area:     3.14 cm LV ESV:       85 ml                         LV SV:        108 ml RIGHT VENTRICLE RV Basal diam:  3.30 cm RV S prime:     7.07 cm/s TAPSE (M-mode): 1.3 cm LEFT ATRIUM              Index       RIGHT ATRIUM           Index LA diam:        6.10 cm  2.97 cm/m  RA Area:     17.30 cm LA Vol (A2C):   110.0 ml 53.49 ml/m RA Volume:   47.20 ml  22.95 ml/m LA Vol (A4C):   130.0 ml 63.22 ml/m LA Biplane Vol: 130.0 ml 63.22 ml/m  AORTIC VALVE AV Area (Vmax):    1.63 cm AV Area (Vmean):   1.83 cm AV Area  (VTI):     1.81 cm AV Vmax:           195.00 cm/s AV Vmean:          119.000 cm/s AV VTI:            0.356 m AV Peak Grad:      15.2 mmHg AV Mean Grad:      7.0 mmHg LVOT Vmax:         101.00 cm/s LVOT Vmean:        69.500 cm/s LVOT VTI:          0.205 m LVOT/AV VTI ratio: 0.58 AI PHT:            504 msec  AORTA Ao Root diam: 3.10 cm TRICUSPID VALVE TR Peak grad:   52.4 mmHg TR Vmax:        362.00 cm/s  SHUNTS Systemic VTI:  0.20 m Systemic Diam: 2.00 cm Skeet Latch MD Electronically signed by Skeet Latch MD Signature Date/Time: 06/24/2021/12:24:55 PM    Final      Medications:     Scheduled Medications:  [MAR Hold] allopurinol  100 mg Oral Daily   [MAR Hold] amiodarone  200 mg Oral Daily   [MAR Hold] atorvastatin  20 mg Oral  QHS   [MAR Hold] calcitRIOL  0.25 mcg Oral Daily   [MAR Hold] DULoxetine  60 mg Oral QHS   [MAR Hold] ferrous sulfate  325 mg Oral Q breakfast   [MAR Hold] furosemide  80 mg Intravenous BID   [MAR Hold] magnesium oxide  400 mg Oral BID   [MAR Hold] melatonin  1.5 mg Oral QHS   [MAR Hold] omega-3 acid ethyl esters  2 g Oral Daily   [MAR Hold] potassium chloride SA  40 mEq Oral BID   [MAR Hold] predniSONE  5 mg Oral Daily   [MAR Hold] QUEtiapine  150 mg Oral QHS   [MAR Hold] sodium chloride flush  3 mL Intravenous Q12H   sodium chloride flush  3 mL Intravenous Q12H   [MAR Hold] tacrolimus  0.5 mg Oral QODAY   [MAR Hold] tacrolimus  1 mg Oral QODAY   [MAR Hold] vitamin B-12  500 mcg Oral Daily   [MAR Hold] warfarin  4 mg Oral Once per day on Tue   [facility] Hold] Warfarin - Pharmacist Dosing Inpatient   Does not apply q1600    Infusions:  [MAR Hold] sodium chloride     sodium chloride     sodium chloride 10 mL/hr at 06/25/21 0602    PRN Medications: [MAR Hold] sodium chloride, sodium chloride, [MAR Hold] acetaminophen, [MAR Hold] albuterol, fentaNYL, [MAR Hold] fluticasone, Heparin (Porcine) in NaCl, lidocaine (PF), midazolam, [MAR Hold] nitroGLYCERIN, [MAR  Hold] ondansetron (ZOFRAN) IV, [MAR Hold] sodium chloride flush, sodium chloride flush, [MAR Hold] traZODone, [MAR Hold] zolpidem  Assessment/Plan   1. Acute on chronic HF with mid range EF: Ischemic cardiomyopathy.  Echo this admission with EF 45-50%, moderate LVH, RV normal, PASP 55 mmHg. Recent admission in 6/22 with CHF.  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  Also concern for possible cardiac amyloidosis with LVH.  He remains volume overloaded on exam but good diuresis so far.  Creatinine mildly higher at 2.17.  RHC with elevated right and left heart filling pressures today.  - Will arrange for PYP scan and myeloma panel/urine immunofixation for amyloidosis workup.  - With CKD, will try to get him on SGLT2 inhibitor eventually.  - Lasix 80 mg IV bid and follow creatinine closely.  - Consider Cardiomems 2. CKD: Stage 3b.  Creatinine 2.17 today, mildly higher.  History of renal transplantation.  - On tacrolimus and prednisone.  - Follow creatinine closely with diuresis.  3. VT: Patient has history of VT, maintained on amiodarone.  4. Atrial fibrillation: Permanent x years.  Unlikely to be able to regain NSR for any significant period of time.  - Continue warfarin.   5. CAD: s/p CABG 1992.  Last LHC in 2019 in setting of VT with patent LIMA to LAD, patent SVG to PDA, moderate stenosis SVG to ramus, occluded SVG to OM1 (medical therapy suggested d/t comorbidities and thrombocytopenia with need for anticoagulation).  No chest pain.  Mildly elevated HS-TnI likely due to demand ischemia from volume overload.  - Continue statin.  - On warfarin so ASA not needed.  6. S/p liver transplant: followed at [facility].  7. Depression  PT consult  Length of Stay: 2  Loralie Champagne, MD  06/25/2021, 8:26 AM  Advanced Heart Failure Team Pager [phone] (M-F; 7a - 5p)  Please contact [facility] for night-coverage after hours (5p -7a ) and  weekends on [email or web address]

## 2021-06-25 NOTE — Progress Notes (Signed)
   06/25/21 1210  Mobility  Activity Refused mobility

## 2021-06-26 ENCOUNTER — Encounter: Payer: Self-pay | Admitting: Internal Medicine

## 2021-06-26 ENCOUNTER — Inpatient Hospital Stay (HOSPITAL_COMMUNITY): Payer: Medicare Other

## 2021-06-26 ENCOUNTER — Inpatient Hospital Stay: Payer: Medicare Other | Admitting: Hematology and Oncology

## 2021-06-26 ENCOUNTER — Other Ambulatory Visit: Payer: Medicare Other

## 2021-06-26 DIAGNOSIS — J9601 Acute respiratory failure with hypoxia: Secondary | ICD-10-CM

## 2021-06-26 DIAGNOSIS — I5023 Acute on chronic systolic (congestive) heart failure: Secondary | ICD-10-CM | POA: Diagnosis not present

## 2021-06-26 DIAGNOSIS — Z944 Liver transplant status: Secondary | ICD-10-CM | POA: Diagnosis not present

## 2021-06-26 DIAGNOSIS — Z940 Kidney transplant status: Secondary | ICD-10-CM | POA: Diagnosis not present

## 2021-06-26 DIAGNOSIS — Z7189 Other specified counseling: Secondary | ICD-10-CM

## 2021-06-26 LAB — BASIC METABOLIC PANEL
Anion gap: 9 (ref 5–15)
BUN: 31 mg/dL — ABNORMAL HIGH (ref 8–23)
CO2: 33 mmol/L — ABNORMAL HIGH (ref 22–32)
Calcium: 9.5 mg/dL (ref 8.9–10.3)
Chloride: 97 mmol/L — ABNORMAL LOW (ref 98–111)
Creatinine, Ser: 2.06 mg/dL — ABNORMAL HIGH (ref 0.61–1.24)
GFR, Estimated: 34 mL/min — ABNORMAL LOW (ref >60)
Glucose, Bld: 115 mg/dL — ABNORMAL HIGH (ref 70–99)
Potassium: 3.9 mmol/L (ref 3.5–5.1)
Sodium: 139 mmol/L (ref 135–145)

## 2021-06-26 LAB — POCT INR: INR: 2.4 (ref 2.0–3.0)

## 2021-06-26 LAB — CBC WITH DIFFERENTIAL/PLATELET
Abs Immature Granulocytes: 0.09 10*3/uL — ABNORMAL HIGH (ref 0.00–0.07)
Basophils Absolute: 0 10*3/uL (ref 0.0–0.1)
Basophils Relative: 1 %
Eosinophils Absolute: 0.1 10*3/uL (ref 0.0–0.5)
Eosinophils Relative: 3 %
HCT: 39.5 % (ref 39.0–52.0)
Hemoglobin: 12.6 g/dL — ABNORMAL LOW (ref 13.0–17.0)
Immature Granulocytes: 2 %
Lymphocytes Relative: 18 %
Lymphs Abs: 0.9 10*3/uL (ref 0.7–4.0)
MCH: 28.4 pg (ref 26.0–34.0)
MCHC: 31.9 g/dL (ref 30.0–36.0)
MCV: 89 fL (ref 80.0–100.0)
Monocytes Absolute: 0.3 10*3/uL (ref 0.1–1.0)
Monocytes Relative: 7 %
Neutro Abs: 3.4 10*3/uL (ref 1.7–7.7)
Neutrophils Relative %: 69 %
Platelets: 80 10*3/uL — ABNORMAL LOW (ref 150–400)
RBC: 4.44 MIL/uL (ref 4.22–5.81)
RDW: 16.9 % — ABNORMAL HIGH (ref 11.5–15.5)
WBC: 4.8 10*3/uL (ref 4.0–10.5)
nRBC: 0.4 % — ABNORMAL HIGH (ref 0.0–0.2)

## 2021-06-26 LAB — PROTIME-INR
INR: 2.4 — ABNORMAL HIGH (ref 0.8–1.2)
Prothrombin Time: 26.5 seconds — ABNORMAL HIGH (ref 11.4–15.2)

## 2021-06-26 MED ORDER — DAPAGLIFLOZIN PROPANEDIOL 10 MG PO TABS
10.0000 mg | ORAL_TABLET | Freq: Every day | ORAL | Status: DC
  Administered 2021-06-26 – 2021-06-27 (×2): 10 mg via ORAL
  Filled 2021-06-26 (×2): qty 1

## 2021-06-26 MED ORDER — POTASSIUM CHLORIDE CRYS ER 20 MEQ PO TBCR
40.0000 meq | EXTENDED_RELEASE_TABLET | Freq: Once | ORAL | Status: AC
  Administered 2021-06-26: 40 meq via ORAL
  Filled 2021-06-26: qty 2

## 2021-06-26 MED ORDER — TECHNETIUM TC 99M PYROPHOSPHATE
20.8000 | Freq: Once | INTRAVENOUS | Status: AC | PRN
  Administered 2021-06-26: 20.8 via INTRAVENOUS
  Filled 2021-06-26: qty 21

## 2021-06-26 MED ORDER — METOLAZONE 2.5 MG PO TABS
2.5000 mg | ORAL_TABLET | Freq: Once | ORAL | Status: AC
  Administered 2021-06-26: 2.5 mg via ORAL
  Filled 2021-06-26: qty 1

## 2021-06-26 NOTE — Progress Notes (Signed)
This encounter was created in error - please disregard.

## 2021-06-26 NOTE — TOC CM/SW Note (Addendum)
HF TOC CM spoke to pt and explained he will dc home with [facility] with [place]. Message sent to [facility] rep, Ramond Marrow to follow up for possible dc home this weekend. Pt may oxygen for home while sleeping. Message sent to attending for pt to have RT set up for continuous overnight pulse oximetry test for oxygen at home only while sleeping. [place] for new oxygen referral. Referral made to HF Home Paramedicine program [place], Heart Failure TOC CM [phone]

## 2021-06-26 NOTE — Plan of Care (Signed)

## 2021-06-26 NOTE — Progress Notes (Signed)
Patient requests shower, provider notified.

## 2021-06-26 NOTE — Progress Notes (Signed)
Hospitalist consult PROGRESS NOTE    [name]  HKV:[id number] DOB: 1950/11/18 DOA: 06/23/2021 PCP: [name]., MD    Chief Complaint  Patient presents with   Shortness of Breath    Brief Narrative:  [name] is an 70 y.o. male with history of chronic HFrEF, atrial fibrillation on warfarin, hx of liver/kidney transplant on tacrolimus/prednisone who was seen at his cardiologist office and found to be short of breath with orthopnea and tachypnea and was subsequently admitted onto the cardiologist service for acute heart failure exacerbation. Hospitalist were asked to consult for chronic medical conditions, nephrology also consulted due to h/o renal transplant  Subjective:  He is resting in bed, appear weak but denies pain, report no short of breath He is on room air currently   Assessment & Plan:   Principal Problem:   Acute on chronic systolic CHF (congestive heart failure) (HCC) Active Problems:   Idiopathic thrombocytopenia (HCC)   HTN (hypertension)   HLD (hyperlipidemia)   CAD (coronary artery disease)   Atrial fibrillation ([place])   Gout   History of liver transplant ([place])   History of kidney transplant   Acute respiratory failure with hypoxia (HCC)   CKD (chronic kidney disease), stage III ([place])   Acute on chronic systolic CHF exacerbation, Right side pleural effusion, acute hypoxic respiratory failure -Initially O2 sats 85 to 86% on room air, required 2 L oxygen supplement -S/p right side cardiac cath this morning Management per cardiology, currently on Lasix IV 80 mg twice a day, appear improving, now on room air, no hypoxia Undergoing work-up for amyloidosis  History of CAD status post CABG Denies chest pain On Lipitor  Chronic A. Fib, history of VT Currently on amiodarone, in A. Fib, rate controlled, also on Coumadin, INR 2.6 today  CKD 3B, history of liver kidney transplant, immunosuppressed status Nephrology following, monitor  creatinine On tacrolimus and prednisone  Chronic thrombocytopenia Appear at baseline No sign of bleeding  History of gout, continue allopurinol, stable no acute issues    Body mass index is 27.38 kg/m.[name]       Unresulted Labs (From admission, onward)     Start     Ordered   06/27/21 0500  Magnesium  Tomorrow morning,   R        06/26/21 1236   06/25/21 1710  Multiple Myeloma Panel (SPEP&IFE w/QIG)  Once,   R        06/25/21 1709   06/25/21 0500  CBC with Differential/Platelet  Daily,   R      06/24/21 2042   06/24/21 1503  Immunofixation electrophoresis  Once,   R        06/24/21 1503   06/24/21 1501  UPEP/UIFE/Light Chains/TP, 24-Hr Ur  Once,   R        06/24/21 1503   06/24/21 1500  Protein electrophoresis, serum  Once,   R        06/24/21 1503   06/24/21 5643  Basic metabolic panel  Daily,   R      06/23/21 2200   06/24/21 0500  Protime-INR  Daily,   R      06/23/21 2125              DVT prophylaxis:  warfarin (COUMADIN) tablet 2 mg  warfarin (COUMADIN) tablet 4 mg   Code Status: DNR Family Communication: Patient Disposition:   Status is: Inpatient  Dispo: The patient is from: Home  Anticipated d/c is to: Home              Anticipated d/c date is: To be determined by cardiology                Consultants:  Cardiology is primary Nephrology as consultant [facility] hospitalist is consulted  Procedures:  Right side Cardiac cath on 8/18  Antimicrobials:   none     Objective: Vitals:   06/26/21 0253 06/26/21 0350 06/26/21 0817 06/26/21 1243  BP:  109/70 132/82   Pulse:  79 77 80  Resp:  (!) 21 (!) 24 20  Temp:  97.9 F (36.6 C) 97.8 F (36.6 C) 99.5 F (37.5 C)  TempSrc:  Oral Oral Oral  SpO2: 92% 100% 97% 95%  Weight:      Height:        Intake/Output Summary (Last 24 hours) at 06/26/2021 1621 Last data filed at 06/26/2021 1415 Gross per 24 hour  Intake 1200 ml  Output 1200 ml  Net 0 ml   Filed Weights   06/23/21  2201 06/25/21 0204 06/26/21 0032  Weight: 87.6 kg 86.7 kg 86.5 kg    Examination:  General exam: Appear weak ,chronically ill-appearing ,calm, NAD Respiratory system: Decreased breath sounds right lower lobe, left side with good air movement, no wheezing, no rales, no rhonchi, respiratory effort normal. Cardiovascular system: S1 & S2 heard, IRRR.  Gastrointestinal system: Protuberant abdomen, multiple well-healed surgical scar, nontender.  Normal bowel sounds heard. Central nervous system: Alert and oriented. No focal neurological deficits. Extremities: No edema Skin: No rashes, lesions or ulcers Psychiatry: Judgement and insight appear normal. Mood & affect appropriate.     Data Reviewed: I have personally reviewed following labs and imaging studies  CBC: Recent Labs  Lab 06/23/21 1707 06/25/21 0118 06/25/21 0810 06/26/21 0342  WBC 5.5 5.0  --  4.8  NEUTROABS 4.4 3.6  --  3.4  HGB 13.4 11.8* 12.2*  12.2* 12.6*  HCT 42.3 37.3* 36.0*  36.0* 39.5  MCV 89.2 88.2  --  89.0  PLT 73* 76*  --  80*    Basic Metabolic Panel: Recent Labs  Lab 06/23/21 1707 06/24/21 0255 06/25/21 0118 06/25/21 0810 06/26/21 0342  NA 139 139 141 143  143 139  K 3.8 3.4* 3.6 3.8  3.8 3.9  CL 101 101 100  --  97*  CO2 29 31 33*  --  33*  GLUCOSE 125* 117* 131*  --  115*  BUN 24* 23 25*  --  31*  CREATININE 1.91* 2.03* 2.17*  --  2.06*  CALCIUM 9.5 9.4 9.5  --  9.5  MG  --  2.0  --   --   --     GFR: Estimated Creatinine Clearance: 35.4 mL/min (A) (by C-G formula based on SCr of 2.06 mg/dL (H)).  Liver Function Tests: Recent Labs  Lab 06/23/21 1707  AST 17  ALT 16  ALKPHOS 62  BILITOT 2.0*  PROT 7.0  ALBUMIN 3.5    CBG: No results for input(s): GLUCAP in the last 168 hours.   Recent Results (from the past 240 hour(s))  Resp Panel by RT-PCR (Flu A&B, Covid) Nasopharyngeal Swab     Status: None   Collection Time: 06/23/21  5:12 PM   Specimen: Nasopharyngeal Swab;  Nasopharyngeal(NP) swabs in vial transport medium  Result Value Ref Range Status   SARS Coronavirus 2 by RT PCR NEGATIVE NEGATIVE Final    Comment: (NOTE) SARS-CoV-2 target nucleic  acids are NOT DETECTED.  The SARS-CoV-2 RNA is generally detectable in upper respiratory specimens during the acute phase of infection. The lowest concentration of SARS-CoV-2 viral copies this assay can detect is 138 copies/mL. A negative result does not preclude SARS-Cov-2 infection and should not be used as the sole basis for treatment or other patient management decisions. A negative result may occur with  improper specimen collection/handling, submission of specimen other than nasopharyngeal swab, presence of viral mutation(s) within the areas targeted by this assay, and inadequate number of viral copies(<138 copies/mL). A negative result must be combined with clinical observations, patient history, and epidemiological information. The expected result is Negative.  Fact Sheet for Patients:  EntrepreneurPulse.com.au  Fact Sheet for Healthcare Providers:  IncredibleEmployment.be  This test is no t yet approved or cleared by the [place] FDA and  has been authorized for detection and/or diagnosis of SARS-CoV-2 by FDA under an Emergency Use Authorization (EUA). This EUA will remain  in effect (meaning this test can be used) for the duration of the COVID-19 declaration under Section 564(b)(1) of the Act, 21 U.S.C.section 360bbb-3(b)(1), unless the authorization is terminated  or revoked sooner.       Influenza A by PCR NEGATIVE NEGATIVE Final   Influenza B by PCR NEGATIVE NEGATIVE Final    Comment: (NOTE) The Xpert Xpress SARS-CoV-2/FLU/RSV plus assay is intended as an aid in the diagnosis of influenza from Nasopharyngeal swab specimens and should not be used as a sole basis for treatment. Nasal washings and aspirates are unacceptable for Xpert Xpress  SARS-CoV-2/FLU/RSV testing.  Fact Sheet for Patients: EntrepreneurPulse.com.au  Fact Sheet for Healthcare Providers: IncredibleEmployment.be  This test is not yet approved or cleared by the [place] FDA and has been authorized for detection and/or diagnosis of SARS-CoV-2 by FDA under an Emergency Use Authorization (EUA). This EUA will remain in effect (meaning this test can be used) for the duration of the COVID-19 declaration under Section 564(b)(1) of the Act, 21 U.S.C. section 360bbb-3(b)(1), unless the authorization is terminated or revoked.  Performed at [facility], [place] [address], [place], [place] [address]          Radiology Studies: CARDIAC CATHETERIZATION  Result Date: 06/25/2021 1. Elevated right and left heart filling pressures. 2. Primarily pulmonary venous hypertension. 3. Preserved cardiac output.        Scheduled Meds:  allopurinol  100 mg Oral Daily   amiodarone  200 mg Oral Daily   atorvastatin  20 mg Oral QHS   calcitRIOL  0.25 mcg Oral Daily   dapagliflozin propanediol  10 mg Oral Daily   DULoxetine  60 mg Oral QHS   ferrous sulfate  325 mg Oral Q breakfast   furosemide  80 mg Intravenous BID   magnesium oxide  400 mg Oral BID   melatonin  1.5 mg Oral QHS   omega-3 acid ethyl esters  2 g Oral Daily   potassium chloride SA  40 mEq Oral BID   predniSONE  5 mg Oral Daily   QUEtiapine  150 mg Oral QHS   sodium chloride flush  3 mL Intravenous Q12H   sodium chloride flush  3 mL Intravenous Q12H   tacrolimus  0.5 mg Oral QODAY   tacrolimus  1 mg Oral QODAY   vitamin B-12  500 mcg Oral Daily   warfarin  2 mg Oral Once per day on Sun Mon Wed Thu Fri Sat   [START ON 06/30/2021] warfarin  4 mg Oral  Once per day on Tue   Warfarin - Pharmacist Dosing Inpatient   Does not apply q1600   Continuous Infusions:  sodium chloride     sodium chloride       LOS: 3 days   Time spent: 38mns Greater than 50%  of this time was spent in counseling, explanation of diagnosis, planning of further management, and coordination of care.   Voice Recognition /Viviann Sparedictation system was used to create this note, attempts have been made to correct errors. Please contact the author with questions and/or clarifications.   FFlorencia Reasons MD PhD FACP [facility]  Available via Epic secure chat 7am-7pm for nonurgent issues Please page for urgent issues To page the attending provider between 7A-7P or the covering provider during after hours 7P-7A, please log into the web site [email or web address] and access using universal [place] password for that web site. If you do not have the password, please call the hospital operator.    06/26/2021, 4:21 PM

## 2021-06-26 NOTE — Care Management Important Message (Signed)
Important Message  Patient Details  Name: [name] MRN: [id number] Date of Birth: 02-14-1951   Medicare Important Message Given:  Yes     [name] 06/26/2021, 8:56 AM

## 2021-06-26 NOTE — Progress Notes (Signed)
O2 sat dropped to 78% while pt sleeping on room air. 2L [place] replaced.

## 2021-06-26 NOTE — Progress Notes (Signed)
ANTICOAGULATION CONSULT NOTE - Follow Up Consult  Pharmacy Consult for Warfarin Indication: atrial fibrillation  Allergies  Allergen Reactions   Other Anaphylaxis    Spider venom   Grapefruit Concentrate Other (See Comments)    Pt has been told not to eat grapefruit   Haloperidol Other (See Comments)    Caused the patient to be overly-sedated   Nsaids Other (See Comments)    Cannot take due to liver transplant   Rifampin Other (See Comments)    Caused flushing   Triazolam Other (See Comments)    Caused the patient to be overly-sedated    Patient Measurements: Height: 5\' 10"  (177.8 cm) Weight: 86.5 kg (190 lb 12.8 oz) IBW/kg (Calculated) : 73  Vital Signs: Temp: 97.8 F (36.6 C) (08/19 0817) Temp Source: Oral (08/19 0817) BP: 132/82 (08/19 0817) Pulse Rate: 77 (08/19 0817)  Labs: Recent Labs    06/23/21 1707 06/23/21 1822 06/24/21 0255 06/25/21 0118 06/25/21 0810 06/26/21 0342  HGB 13.4  --   --  11.8* 12.2*  12.2* 12.6*  HCT 42.3  --   --  37.3* 36.0*  36.0* 39.5  PLT 73*  --   --  76*  --  80*  LABPROT 26.6*  --  25.1* 27.6*  --  26.5*  INR 2.5*  --  2.3* 2.6*  --  2.4*  CREATININE 1.91*  --  2.03* 2.17*  --  2.06*  TROPONINIHS 28* 27*  --   --   --   --      Estimated Creatinine Clearance: 35.4 mL/min (A) (by C-G formula based on SCr of 2.06 mg/dL (H)).   Assessment:  Anticoag: Warfarin for Afib.  H/H 12.6, Plt 80 low but stable. INR 2.4 - PTA dose: 4mg  Tues, 2mg  all other days  Goal of Therapy:  INR 2-3 Monitor platelets by anticoagulation protocol: Yes   Plan:  -Warfarin 4mg  Tues, 2mg  all other days--resumed home dosing -Monitor daily PT/INR    Caroline Longie S. Alford Highland, PharmD, BCPS Clinical Staff Pharmacist [email or web address]  Alford Highland, Laiyla Slagel Stillinger 06/26/2021,8:34 AM

## 2021-06-26 NOTE — TOC Progression Note (Signed)
Transition of Care (TOC) - Progression Note  Heart Failure   Patient Details  Name: [name] MRN: [id number] Date of Birth: 09-23-1951  Transition of Care [facility]) CM/SW [place] Phone Number: 06/26/2021, 5:29 PM  Clinical Narrative:    CSW spoke with the patient at bedside to bring them an appointment card for the [facility] outpatient clinic and encouraged them to follow up and to attend the appointment and bring their medications and if anything changes to please reach out so that CSW/HV clinic team can provide support. [name] reported that he prefers an afternoon appointment instead of the morning and would like it scheduled for later in the day. CSW informed that HF team about wanting a later appointment and will need to follow up with this another day. CSW spoke with [name] briefly about outpatient therapy and at first [name] was agreeable and then when the CSW asked about scheduling something he reported he would like to wait. CSW provided [name] with outpatient therapy resources if he changes his mind.   CSW will continue to follow throughout discharge.   Expected Discharge Plan: [place] Barriers to Discharge: Continued Medical Work up  Expected Discharge Plan and Services Expected Discharge Plan: [place] In-house Referral: Clinical Social Work Discharge Planning Services: CM Consult Post Acute Care Choice: [place] Living arrangements for the past 2 months: Single Family Home                                       Social Determinants of Health (SDOH) Interventions Food Insecurity Interventions: Intervention Not Indicated Financial Strain Interventions: Other (Comment) (referral to Advanced HF clinic for medication assistance for HF meds) Housing Interventions: Intervention Not Indicated Transportation Interventions: Intervention Not Indicated  Readmission Risk  Interventions No flowsheet data found.  [name], MSW, [place] Heart Failure Social Worker

## 2021-06-26 NOTE — Consult Note (Signed)
Consultation Note Date: 06/26/2021   Patient Name: [name]  DOB: April 15, 1951  MRN: [id number]  Age / Sex: 70 y.o., male  PCP: [name]., MD Referring Physician: Larey Dresser, MD  Reason for Consultation: Establishing goals of care  HPI/Patient Profile: 70 y.o. 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 [name], depression, chronic ITP admitted on 06/23/2021 with acute on chronic systolic HF.   Patient was recently admitted to [facility] in June for heart failure exacerbation and reports poor quality of life. Palliative care has been consulted to assist with goals of care conversation.  Clinical Assessment and Goals of Care:  I have reviewed medical records including EPIC notes, labs and imaging, assessed the patient and then met at the bedside to discuss diagnosis prognosis, GOC, EOL wishes, disposition and options.  I introduced Palliative Medicine as specialized medical care for people living with serious illness. It focuses on providing relief from the symptoms and stress of a serious illness. The goal is to improve quality of life for both the patient and the family.  We discussed a brief life review of the patient and then focused on their current illness. [name] lives at home alone and he is independent with all ADLs. He had a happy childhood. He worked for [employer] for many years before retiring. He currently enjoys spending time with his family and friends, having recently connected with a high school love after 50 years without contact. He is a man of [place]. He is familiar with living with chronic illnesses, as he had kidney problems as young as 36. [name] understands the  severity of his illness, reflecting that treatments such as "fluid medications" are not as effective as they once were in treating his shortness of breath. He understands that we all have to die, however he states he does not want to die any time soon. He notes that his niece Faythe Dingwall is his designated HCPOA and he has completed a Living Will and discussed his preferences with her. He has been on dialysis in the past and speaks fondly of the people he met during that time, stating "dialysis isn't the worst thing in the world." He would not want certain "heroic measures" such as a feeding tube.   I attempted to elicit values and goals of care important to the patient.   Patient values his independence and quality of life. He would consider it unacceptable QOL to be reliant on others for total care or confined to a bed. He would like to live independently as long as possible. His goal is to continue reconnecting with his first love and spend time with his family.   Advanced directives, concepts specific to code status, artifical feeding and hydration, and rehospitalization were considered and discussed.  Palliative Care services outpatient  were explained and offered.  Discussed the importance of continued conversation with family and the medical providers regarding overall plan of care and treatment options, ensuring decisions are within the context of the patient's values and GOCs.    Questions and concerns were addressed. The family was encouraged to call with questions or concerns.  PMT will continue to support holistically.    PATIENT is the primary decision maker. He reports niece Faythe Dingwall is HCPOA, although this is not on file.    SUMMARY OF RECOMMENDATIONS   -DNR confirmed -Continue full scope treatment -Patient would consider a more comfort focused approach if he was no longer able to care for himself independently -Spiritual care consult at patient request -Patient agrees to outpatient  palliative care referral; [facility] assistance appreciated  Code Status/Advance Care Planning: DNR  Palliative Prophylaxis:  Bowel Regimen and Frequent Pain Assessment  Additional Recommendations (Limitations, Scope, Preferences): Full Scope Treatment  Psycho-social/Spiritual:  Desire for further Chaplaincy support:yes Additional Recommendations: Referral to Community Resources   Prognosis:  Guarded  Discharge Planning: Home with Home Health      Primary Diagnoses: Present on Admission:  Acute on chronic systolic CHF (congestive heart failure) (HCC)  Idiopathic thrombocytopenia (HCC)  HTN (hypertension)  HLD (hyperlipidemia)  CAD (coronary artery disease)  Atrial fibrillation (HCC)  Gout  CKD (chronic kidney disease), stage III ([place])  Acute respiratory failure with hypoxia ([place])   I have reviewed the medical record, interviewed the patient and family, and examined the patient. The following aspects are pertinent.  Past Medical History:  Diagnosis Date   Atrial fibrillation ([place]) 08/2009   CAD (coronary artery disease)    s/p CABG -- 1992   ESRD (end stage renal disease) ([place])    HLD (hyperlipidemia)    HTN (hypertension)    Idiopathic thrombocytopenia (HCC)    Social History   Socioeconomic History   Marital status: Single    Spouse name: Not on file   Number of children: 0   Years of education: Not on file   Highest education level: Not on file  Occupational History   Occupation: retired  Tobacco Use   Smoking status: Never   Smokeless tobacco: Never  Substance and Sexual Activity   Alcohol use: No   Drug use: No   Sexual activity: Not on file  Other Topics Concern   Not on file  Social History Narrative   Not on file   Social Determinants of Health   Financial Resource Strain: Low Risk    Difficulty of Paying Living Expenses: Not very hard  Food Insecurity: No Food Insecurity   Worried About Charity fundraiser in the Last Year: Never true   Ran  Out of Food in the Last Year: Never true  Transportation Needs: No Transportation Needs   Lack of Transportation (Medical): No   Lack of Transportation (Non-Medical): No  Physical Activity: Not on file  Stress: Not on file  Social Connections: Not on file   Family History  Problem Relation Age of Onset   Atrial fibrillation Mother    Breast cancer Mother    Renal cancer Father    Hypertension Brother    Hyperlipidemia Brother    Other Brother        stent    Renal Disease Maternal Grandmother    Stroke Maternal Grandmother    Heart attack Maternal Grandfather    Cancer Paternal Grandmother    Heart failure Paternal Grandfather    Emphysema Paternal Merchant navy officer  Bronchiolitis Paternal Grandfather    Scheduled Meds:  allopurinol  100 mg Oral Daily   amiodarone  200 mg Oral Daily   atorvastatin  20 mg Oral QHS   calcitRIOL  0.25 mcg Oral Daily   dapagliflozin propanediol  10 mg Oral Daily   DULoxetine  60 mg Oral QHS   ferrous sulfate  325 mg Oral Q breakfast   furosemide  80 mg Intravenous BID   magnesium oxide  400 mg Oral BID   melatonin  1.5 mg Oral QHS   omega-3 acid ethyl esters  2 g Oral Daily   potassium chloride SA  40 mEq Oral BID   predniSONE  5 mg Oral Daily   QUEtiapine  150 mg Oral QHS   sodium chloride flush  3 mL Intravenous Q12H   sodium chloride flush  3 mL Intravenous Q12H   tacrolimus  0.5 mg Oral QODAY   tacrolimus  1 mg Oral QODAY   vitamin B-12  500 mcg Oral Daily   warfarin  2 mg Oral Once per day on Sun Mon Wed Thu Fri Sat   [START ON 06/30/2021] warfarin  4 mg Oral Once per day on Tue   Warfarin - Pharmacist Dosing Inpatient   Does not apply q1600   Continuous Infusions:  sodium chloride     sodium chloride     PRN Meds:.sodium chloride, sodium chloride, acetaminophen, albuterol, fluticasone, nitroGLYCERIN, ondansetron (ZOFRAN) IV, sodium chloride flush, sodium chloride flush, traZODone, zolpidem Medications Prior to Admission:  Prior to  Admission medications   Medication Sig Start Date End Date Taking? Authorizing Provider  acetaminophen (TYLENOL) 500 MG tablet Take 1,000 mg by mouth every 6 (six) hours as needed for mild pain or headache.    Yes [provider]  allopurinol (ZYLOPRIM) 100 MG tablet Take 100 mg by mouth daily.   Yes [provider]  amiodarone (PACERONE) 200 MG tablet TAKE 1 TABLET BY MOUTH EVERY DAY Patient taking differently: Take 200 mg by mouth daily. 04/27/21  Yes Deboraha Sprang, MD  atorvastatin (LIPITOR) 20 MG tablet Take 20 mg by mouth at bedtime.    Yes [provider]  calcitRIOL (ROCALTROL) 0.25 MCG capsule Take 0.25 mcg by mouth daily.   Yes [provider]  Cholecalciferol (VITAMIN D3) 25 MCG (1000 UT) CAPS Take 1,000 Units by mouth daily.    Yes [provider]  Cyanocobalamin (VITAMIN B12) 500 MCG TABS Take 500 mcg by mouth daily.    Yes [provider]  DULoxetine (CYMBALTA) 60 MG capsule Take 60 mg by mouth at bedtime.   Yes [provider]  EPINEPHrine (EPI-PEN) 0.3 mg/0.3 mL DEVI Inject 0.3 mg into the muscle once as needed (severe allergic reaction).   Yes [provider]  ferrous sulfate 325 (65 FE) MG tablet Take 325 mg by mouth daily with breakfast.   Yes [provider]  fish oil-omega-3 fatty acids 1000 MG capsule Take 2 g by mouth daily.   Yes [provider]  fluticasone (FLONASE) 50 MCG/ACT nasal spray Place 2 sprays into both nostrils daily as needed for allergies or rhinitis.    Yes [provider]  furosemide (LASIX) 40 MG tablet Take 40 mg by mouth 2 (two) times daily.   Yes [provider]  MAGNESIUM-OXIDE 400 (240 Mg) MG tablet Take 400 mg by mouth 2 (two) times daily. 05/04/21  Yes [provider]  melatonin 5 MG TABS Take 5 mg by mouth at bedtime.  Yes [provider]  metolazone (ZAROXOLYN) 5 MG tablet Take 5 mg by mouth See admin instructions. Takes 5  mg twice weekly 09/14/18  Yes [provider]  nitroGLYCERIN (NITROSTAT) 0.4 MG SL tablet Place 0.4 mg under the tongue every 5 (five) minutes as needed for chest pain.   Yes [provider]  potassium chloride SA (KLOR-CON) 20 MEQ tablet Take 2 tablets by mouth 3 (three) times daily.   Yes [provider]  predniSONE (DELTASONE) 5 MG tablet Take 1 tablet (5 mg total) by mouth daily. 12/29/16  Yes [name], MD  QUEtiapine (SEROQUEL) 50 MG tablet Take 150 mg by mouth at bedtime. 08/13/19  Yes [provider]  tacrolimus (PROGRAF) 0.5 MG capsule Take 0.5 mg by mouth See admin instructions. Takes 1 tablet by mouth one day and 2 tablets next day (1 and 2 tablets on alternate days)   Yes [provider]  traZODone (DESYREL) 150 MG tablet Take 150 mg by mouth at bedtime as needed for sleep.    Yes [provider]  warfarin (COUMADIN) 4 MG tablet TAKE AS DIRECTED BY COUMADIN CLINIC Patient taking differently: Take 2-4 mg by mouth See admin instructions. Takes 4 mg on Tuesdays and 2 mg on all other days 09/19/20  Yes Deboraha Sprang, MD  albuterol (PROVENTIL) (2.5 MG/3ML) 0.083% nebulizer solution Take 2.5 mg by nebulization every 2 (two) hours as needed for wheezing or shortness of breath. Patient not taking: Reported on 06/23/2021    [provider]  carvedilol (COREG) 6.25 MG tablet TAKE 1 TABLET BY MOUTH IN THE MORNING AND 2 TABLETS AT NIGHT Patient not taking: No sig reported 03/11/21   Deboraha Sprang, MD  losartan (COZAAR) 25 MG tablet Take 1 tablet by mouth daily. Patient not taking: No sig reported 05/11/21 05/11/22  [provider]   Allergies  Allergen Reactions   Other Anaphylaxis    Spider venom   Grapefruit Concentrate Other (See Comments)    Pt has been told not to eat grapefruit   Haloperidol Other (See Comments)    Caused the patient to be overly-sedated   Nsaids Other (See Comments)    Cannot take due to liver  transplant   Rifampin Other (See Comments)    Caused flushing   Triazolam Other (See Comments)    Caused the patient to be overly-sedated   Review of Systems  Respiratory:  Positive for shortness of breath.   All other systems reviewed and are negative.  Physical Exam Vitals and nursing note reviewed.  Constitutional:      General: He is not in acute distress.    Interventions: Nasal cannula in place.  Cardiovascular:     Rate and Rhythm: Normal rate.  Pulmonary:     Effort: Pulmonary effort is normal. No respiratory distress.  Neurological:     Mental Status: He is alert and oriented to person, place, and time.    Vital Signs: BP 132/82 (BP Location: Left Arm)   Pulse 80   Temp 99.5 F (37.5 C) (Oral)   Resp 20   Ht 5' 10" (1.778 m)   Wt 86.5 kg   SpO2 95%   BMI 27.38 kg/m  Pain Scale: 0-10   Pain Score: 0-No pain   SpO2: SpO2: 95 % O2 Device:SpO2: 95 % O2 Flow Rate: .O2 Flow Rate (L/min): 3 L/min  IO: Intake/output summary:  Intake/Output Summary (Last 24 hours) at 06/26/2021 1250 Last data filed at 06/26/2021  1248 Gross per 24 hour  Intake 840 ml  Output 750 ml  Net 90 ml    LBM: Last BM Date: 06/26/21 Baseline Weight: Weight: 87.6 kg (scale C) Most recent weight: Weight: 86.5 kg     Palliative Assessment/Data:     Time In: 12:45pm Time Out: 1:35pm Time Total: 50 minutes Greater than 50% of this time was spent in counseling and coordinating care related to the above assessment and plan.  Dorthy Cooler, PA-C Palliative Medicine Team Team phone # [phone]  Thank you for allowing the Palliative Medicine Team to assist in the care of this patient. Please utilize secure chat with additional questions, if there is no response within 30 minutes please call the above phone number.  Palliative Medicine Team providers are available by phone from 7am to 7pm daily and can be reached through the team cell phone.  Should this patient require assistance  outside of these hours, please call the patient's attending physician.

## 2021-06-26 NOTE — Patient Instructions (Addendum)
Description    Informed pt to follow what the hospital instructs him to do and to call us when he gets out of hospital.   Normal dose: Warfarin 2mg  daily except 4mg  on Tuesdays. Remain consistent with your green leafy vegetables. Recheck INR in 1 week, self tester for insurance purposes. [phone]

## 2021-06-26 NOTE — Progress Notes (Signed)
OT Cancellation Note  Patient Details Name: [name] MRN: [id number] DOB: [birth year]   Cancelled Treatment:    Reason Eval/Treat Not Completed: OT screened, no needs identified, will sign off  [place], OT/L   Acute OT Clinical Specialist Acute Rehabilitation Services Pager [phone] Office [phone]  06/26/2021, 6:49 AM

## 2021-06-26 NOTE — Progress Notes (Addendum)
Patient ID: [name], male   DOB: 06/10/1951, 70 y.o.   MRN: [id number]     Advanced Heart Failure Rounding Note  PCP-Cardiologist: Dr. [name] HF: Establishing with Dr. Aundra Dubin  Subjective:    Neg 700 cc last 24 hr, but unmeasured voids noted. Weight only down 3 lb  02 desaturations to upper 70s last night while sleeping. Intermittently needing 2-3L 02, [place].  Denies dyspnea. No chest pain.   RHC: Hemodynamics (mmHg):  RA mean 13 RV 53/15 PA 63/23, mean 39 PCWP mean 20 Oxygen saturations: PA 69% AO 96% Cardiac Output (Fick) 6.3  Cardiac Index (Fick) 3.07 PVR 3 WU Cardiac Output (Thermo) 5.62 Cardiac Index (Thermo) 2.74  PVR 3.4 WU    Objective:   Weight Range: 86.5 kg Body mass index is 27.38 kg/m.   Vital Signs:   Temp:  [97.9 F (36.6 C)-98.2 F (36.8 C)] 97.9 F (36.6 C) (08/19 0350) Pulse Rate:  [0-295] 79 (08/19 0350) Resp:  [0-57] 21 (08/19 0350) BP: (109-134)/(57-75) 109/70 (08/19 0350) SpO2:  [0 %-100 %] 100 % (08/19 0350) Weight:  [86.5 kg] 86.5 kg (08/19 0032) Last BM Date: 06/25/21  Weight change: Filed Weights   06/23/21 2201 06/25/21 0204 06/26/21 0032  Weight: 87.6 kg 86.7 kg 86.5 kg    Intake/Output:   Intake/Output Summary (Last 24 hours) at 06/26/2021 0753 Last data filed at 06/25/2021 2253 Gross per 24 hour  Intake 716 ml  Output 1425 ml  Net -709 ml      Physical Exam    General:  Well appearing.  HEENT: normal Neck: supple. JVP 12 cm. Carotids 2+ bilat; no bruits. No lymphadenopathy or thryomegaly appreciated. Cor: PMI nondisplaced. Irregular rhythm. No rubs, gallops, 1-2/6 SEM RUSB Lungs: clear Abdomen: soft, nontender, + distended. Using abdominal muscles to breathe. No hepatosplenomegaly. No bruits or masses. Good bowel sounds. Extremities: no cyanosis, clubbing, rash, edema Neuro: alert & orientedx3, cranial nerves grossly intact. moves all 4 extremities w/o difficulty. Affect pleasant    Telemetry  Afib  70s-80s, personally reviewed  Labs    CBC Recent Labs    06/25/21 0118 06/25/21 0810 06/26/21 0342  WBC 5.0  --  4.8  NEUTROABS 3.6  --  3.4  HGB 11.8* 12.2*  12.2* 12.6*  HCT 37.3* 36.0*  36.0* 39.5  MCV 88.2  --  89.0  PLT 76*  --  80*   Basic Metabolic Panel Recent Labs    06/24/21 0255 06/25/21 0118 06/25/21 0810 06/26/21 0342  NA 139 141 143  143 139  K 3.4* 3.6 3.8  3.8 3.9  CL 101 100  --  97*  CO2 31 33*  --  33*  GLUCOSE 117* 131*  --  115*  BUN 23 25*  --  31*  CREATININE 2.03* 2.17*  --  2.06*  CALCIUM 9.4 9.5  --  9.5  MG 2.0  --   --   --    Liver Function Tests Recent Labs    06/23/21 1707  AST 17  ALT 16  ALKPHOS 62  BILITOT 2.0*  PROT 7.0  ALBUMIN 3.5   No results for input(s): LIPASE, AMYLASE in the last 72 hours. Cardiac Enzymes No results for input(s): CKTOTAL, CKMB, CKMBINDEX, TROPONINI in the last 72 hours.  BNP: BNP (last 3 results) Recent Labs    06/23/21 1707  BNP 186.0*    ProBNP (last 3 results) No results for input(s): PROBNP in the last 8760 hours.   D-Dimer No  results for input(s): DDIMER in the last 72 hours. Hemoglobin A1C No results for input(s): HGBA1C in the last 72 hours. Fasting Lipid Panel No results for input(s): CHOL, HDL, LDLCALC, TRIG, CHOLHDL, LDLDIRECT in the last 72 hours. Thyroid Function Tests Recent Labs    06/24/21 0255  TSH 1.789    Other results:   Imaging    CARDIAC CATHETERIZATION  Result Date: 06/25/2021 1. Elevated right and left heart filling pressures. 2. Primarily pulmonary venous hypertension. 3. Preserved cardiac output.     Medications:     Scheduled Medications:  allopurinol  100 mg Oral Daily   amiodarone  200 mg Oral Daily   atorvastatin  20 mg Oral QHS   calcitRIOL  0.25 mcg Oral Daily   DULoxetine  60 mg Oral QHS   ferrous sulfate  325 mg Oral Q breakfast   furosemide  80 mg Intravenous BID   magnesium oxide  400 mg Oral BID   melatonin  1.5 mg Oral QHS    omega-3 acid ethyl esters  2 g Oral Daily   potassium chloride SA  40 mEq Oral BID   predniSONE  5 mg Oral Daily   QUEtiapine  150 mg Oral QHS   sodium chloride flush  3 mL Intravenous Q12H   sodium chloride flush  3 mL Intravenous Q12H   tacrolimus  0.5 mg Oral QODAY   tacrolimus  1 mg Oral QODAY   vitamin B-12  500 mcg Oral Daily   warfarin  2 mg Oral Once per day on Sun Mon Wed Thu Fri Sat   [START ON 06/30/2021] warfarin  4 mg Oral Once per day on Tue   Warfarin - Pharmacist Dosing Inpatient   Does not apply q1600    Infusions:  sodium chloride     sodium chloride      PRN Medications: sodium chloride, sodium chloride, acetaminophen, albuterol, fluticasone, nitroGLYCERIN, ondansetron (ZOFRAN) IV, sodium chloride flush, sodium chloride flush, traZODone, zolpidem  Assessment/Plan   1. Acute on chronic HF with mid range EF: Ischemic cardiomyopathy.  Echo this admission with EF 45-50%, moderate LVH, RV normal, PASP 55 mmHg. Recent admission in 6/22 with CHF.  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  Also concern for possible cardiac amyloidosis with LVH.  RHC with elevated right and left heart filling pressures. - PYP scan and myeloma panel/urine immunofixation pending for amyloidosis workup. Will contact [facility] - With CKD, will try to get him on SGLT2 inhibitor eventually.  - On lasix 80 mg IV BID. Weight only down 3 lb. Remains volume overloaded. Will give 2.5 mg metolazone today.  - Creatinine stable at 2.06. Monitor Scr and K/Mag with diuresis - Consider Cardiomems - will discuss with his sister, Opal Sidles, who is very involved in his care -? Untreated sleep apnea. 02 desaturations noted while sleeping. Sleep evaluation as outpatient. 2. CKD: Stage 3b.  Creatinine 2.06 today, down slightly from yesterday. Near baseline.  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  - Follows with Dr.  Moshe Cipro, Nephrology following. Appreciate input. - On tacrolimus and prednisone.  - Follow creatinine closely with diuresis.  3. VT: Patient has history of VT, maintained on amiodarone.  4. Atrial fibrillation: Permanent x years.  Rate controlled. Unlikely to be able to regain NSR for any significant period of time.  - Continue warfarin.   5. CAD: s/p CABG 1992.  Last LHC in 2019 in  setting of VT with patent LIMA to LAD, patent SVG to PDA, moderate stenosis SVG to ramus, occluded SVG to OM1 (medical therapy suggested d/t comorbidities and thrombocytopenia with need for anticoagulation).  No chest pain.  Mildly elevated HS-TnI likely due to demand ischemia from volume overload.  - Continue statin.  - On warfarin so ASA not needed.  6. S/p liver transplant: followed at [facility].  7. Depression 8. Nocturnal hypoxemia -02 desaturations noted while sleeping -? Sleep apnea as above  GOC: -Has been DNR -QOL limited by medical issues. Suspect an element of depression. Palliative Care consulted  Disposition: -Seen by PT/OT, no needs identified. HH at D/C. -TOC/CSW following -PAN HF grant for [facility] [place]: [place], LINDSAY N, PA-C  06/26/2021, 7:53 AM  Advanced Heart Failure Team Pager [phone] (M-F; 7a - 5p)  Please contact [facility] for night-coverage after hours (5p -7a ) and weekends on [email or web address]   Patient seen with PA, agree with the above note.   RHC yesterday as above.  Says he feels ok, still appears mildly tachypneic.  SBP 100s-110s.  Some diuresis yesterday, not marked.  Creatinine at baseline 2.06.   General: NAD Neck: JVP 12 cm, no thyromegaly or thyroid nodule.  Lungs: Clear to auscultation bilaterally with normal respiratory effort. CV: Nondisplaced PMI.  Heart regular S1/S2, no S3/S4, no murmur.  No peripheral edema.   Abdomen: Soft, nontender, no hepatosplenomegaly, no distention.  Skin: Intact without lesions or rashes.  Neurologic: Alert and  oriented x 3.  Psych: Normal affect. Extremities: No clubbing or cyanosis.  HEENT: Normal.   Patient remains volume overloaded.  Continue diuresis, will give Lasix 80 mg IV bid today with metolazone 2.5 x 1.  Follow creatinine closely. I will add Farxiga 10 mg daily.   Long-term, I think he would be a good candidate for Cardiomems.   Working up for cardiac amyloidosis, should get PYP scan today.   Mobilize.   Loralie Champagne 06/26/2021 9:04 AM

## 2021-06-27 DIAGNOSIS — I5023 Acute on chronic systolic (congestive) heart failure: Secondary | ICD-10-CM | POA: Diagnosis not present

## 2021-06-27 LAB — BASIC METABOLIC PANEL
Anion gap: 11 (ref 5–15)
BUN: 35 mg/dL — ABNORMAL HIGH (ref 8–23)
CO2: 31 mmol/L (ref 22–32)
Calcium: 9.3 mg/dL (ref 8.9–10.3)
Chloride: 94 mmol/L — ABNORMAL LOW (ref 98–111)
Creatinine, Ser: 2.27 mg/dL — ABNORMAL HIGH (ref 0.61–1.24)
GFR, Estimated: 31 mL/min — ABNORMAL LOW (ref >60)
Glucose, Bld: 139 mg/dL — ABNORMAL HIGH (ref 70–99)
Potassium: 3.2 mmol/L — ABNORMAL LOW (ref 3.5–5.1)
Sodium: 136 mmol/L (ref 135–145)

## 2021-06-27 LAB — CBC WITH DIFFERENTIAL/PLATELET
Abs Immature Granulocytes: 0.08 10*3/uL — ABNORMAL HIGH (ref 0.00–0.07)
Basophils Absolute: 0 10*3/uL (ref 0.0–0.1)
Basophils Relative: 1 %
Eosinophils Absolute: 0.1 10*3/uL (ref 0.0–0.5)
Eosinophils Relative: 3 %
HCT: 40.3 % (ref 39.0–52.0)
Hemoglobin: 13.1 g/dL (ref 13.0–17.0)
Immature Granulocytes: 2 %
Lymphocytes Relative: 20 %
Lymphs Abs: 1.1 10*3/uL (ref 0.7–4.0)
MCH: 28.4 pg (ref 26.0–34.0)
MCHC: 32.5 g/dL (ref 30.0–36.0)
MCV: 87.2 fL (ref 80.0–100.0)
Monocytes Absolute: 0.4 10*3/uL (ref 0.1–1.0)
Monocytes Relative: 7 %
Neutro Abs: 3.7 10*3/uL (ref 1.7–7.7)
Neutrophils Relative %: 67 %
Platelets: 90 10*3/uL — ABNORMAL LOW (ref 150–400)
RBC: 4.62 MIL/uL (ref 4.22–5.81)
RDW: 17 % — ABNORMAL HIGH (ref 11.5–15.5)
WBC: 5.4 10*3/uL (ref 4.0–10.5)
nRBC: 0.4 % — ABNORMAL HIGH (ref 0.0–0.2)

## 2021-06-27 LAB — PROTIME-INR
INR: 2 — ABNORMAL HIGH (ref 0.8–1.2)
Prothrombin Time: 22.8 seconds — ABNORMAL HIGH (ref 11.4–15.2)

## 2021-06-27 LAB — MAGNESIUM: Magnesium: 2.3 mg/dL (ref 1.7–2.4)

## 2021-06-27 MED ORDER — TORSEMIDE 60 MG PO TABS: 60.0000 mg | ORAL_TABLET | Freq: Every day | ORAL | 0 refills | Status: DC

## 2021-06-27 MED ORDER — POTASSIUM CHLORIDE CRYS ER 20 MEQ PO TBCR
40.0000 meq | EXTENDED_RELEASE_TABLET | Freq: Once | ORAL | Status: AC
  Administered 2021-06-27: 40 meq via ORAL
  Filled 2021-06-27: qty 2

## 2021-06-27 MED ORDER — POTASSIUM CHLORIDE CRYS ER 20 MEQ PO TBCR: 40.0000 meq | EXTENDED_RELEASE_TABLET | Freq: Every day | ORAL | 0 refills | Status: DC

## 2021-06-27 MED ORDER — DAPAGLIFLOZIN PROPANEDIOL 10 MG PO TABS: 10.0000 mg | ORAL_TABLET | Freq: Every day | ORAL | 0 refills | Status: DC

## 2021-06-27 MED ORDER — TORSEMIDE 20 MG PO TABS
60.0000 mg | ORAL_TABLET | Freq: Every day | ORAL | Status: DC
  Administered 2021-06-27: 60 mg via ORAL
  Filled 2021-06-27: qty 3

## 2021-06-27 NOTE — Progress Notes (Signed)
Patients BP is soft tonight, still WNL. RN asked patient about his BP and he states it runs low sometimes and sometimes he feels a little lighted headed. This has been going on for a while, he states. Orthostatic VS were already done previously. Will continue to monitor.

## 2021-06-27 NOTE — TOC Transition Note (Signed)
Transition of Care [facility] - CM/SW Discharge Note   Patient Details  Name: Dontae Minerva MRN: [id number] Date of Birth: August 15, 1951  Transition of [facility]) CM/SW Contact:  Konrad Penta, RN Phone Number: [phone] 06/27/2021, 1:52 PM   Clinical Narrative:   Patient to transition home today. Corene Cornea with [facility] made aware of dc. [facility] previously arranged for [facility] RN.   Nursing reports patient will not need home 02 after all.   Consult for outpatient palliative care services. Spoke with patient about outpatient palliative care choice. No preference. Referral made to [facility] with [facility] for outpatient palliative care services.  Patient will have home health RN with [facility] and outpatient palliative with [facility].  No further needs assessed. Spoke with nursing. Re-consult for further TOC needs.    Final next level of care: [place] Barriers to Discharge: No Barriers Identified   Patient Goals and CMS Choice Patient states their goals for this hospitalization and ongoing recovery are:: return home CMS Medicare.gov Compare Post Acute Care list provided to:: Patient Choice offered to / list presented to : Patient  Discharge Placement                Discharge Plan and Services In-house Referral: Clinical Social Work Discharge Planning Services: CM Consult Post Acute Care Choice: [place] [facility]: [facility] ([place]) Date HH Agency Contacted: 06/27/21 Time [place]: 1352 Representative spoke with at [place]: [place] ([place]) Interventions Food Insecurity Interventions: Intervention Not Indicated Financial Strain Interventions: Other (Comment) (referral to Advanced HF clinic for medication assistance for HF meds) Housing Interventions: Intervention Not Indicated Transportation Interventions: Intervention Not  Indicated   Readmission Risk Interventions No flowsheet data found.

## 2021-06-27 NOTE — Progress Notes (Signed)
Patient [name] is 3.2. Will hold lasix and give schedule 40mg  PO potassium. Paged CHF team to notify.

## 2021-06-27 NOTE — Progress Notes (Signed)
Patient ID: [name], male   DOB: Nov 25, 1950, 70 y.o.   MRN: [id number]     Advanced Heart Failure Rounding Note  PCP-Cardiologist: Dr. [name] HF: Establishing with Dr. Aundra Dubin  Subjective:    I/Os negative, weight down 3 more lbs.  Says breathing is much improved.   RHC: Hemodynamics (mmHg):  RA mean 13 RV 53/15 PA 63/23, mean 39 PCWP mean 20 Oxygen saturations: PA 69% AO 96% Cardiac Output (Fick) 6.3  Cardiac Index (Fick) 3.07 PVR 3 WU Cardiac Output (Thermo) 5.62 Cardiac Index (Thermo) 2.74  PVR 3.4 WU    Objective:   Weight Range: 85 kg Body mass index is 26.9 kg/m.   Vital Signs:   Temp:  [97.8 F (36.6 C)-99.5 F (37.5 C)] 97.9 F (36.6 C) (08/20 0807) Pulse Rate:  [80-87] 84 (08/20 0807) Resp:  [18-20] 18 (08/20 0807) BP: (91-130)/(52-79) 108/59 (08/20 0821) SpO2:  [93 %-95 %] 94 % (08/20 0807) Weight:  [85 kg] 85 kg (08/20 0339) Last BM Date: 06/26/21  Weight change: Filed Weights   06/25/21 0204 06/26/21 0032 06/27/21 0339  Weight: 86.7 kg 86.5 kg 85 kg    Intake/Output:   Intake/Output Summary (Last 24 hours) at 06/27/2021 0846 Last data filed at 06/27/2021 0821 Gross per 24 hour  Intake 1556 ml  Output 3325 ml  Net -1769 ml      Physical Exam    General: NAD Neck: JVP 8 cm, no thyromegaly or thyroid nodule.  Lungs: Clear to auscultation bilaterally with normal respiratory effort. CV: Nondisplaced PMI.  Heart regular S1/S2, no S3/S4, 1/6 SEM RUSB.  No peripheral edema.    Abdomen: Soft, nontender, no hepatosplenomegaly, no distention.  Skin: Intact without lesions or rashes.  Neurologic: Alert and oriented x 3.  Psych: Normal affect. Extremities: No clubbing or cyanosis.  HEENT: Normal.    Telemetry   Afib 70s-80s, personally reviewed  Labs    CBC Recent Labs    06/26/21 0342 06/27/21 0259  WBC 4.8 5.4  NEUTROABS 3.4 3.7  HGB 12.6* 13.1  HCT 39.5 40.3  MCV 89.0 87.2  PLT 80* 90*   Basic Metabolic  Panel Recent Labs    06/26/21 0342 06/27/21 0259  NA 139 136  K 3.9 3.2*  CL 97* 94*  CO2 33* 31  GLUCOSE 115* 139*  BUN 31* 35*  CREATININE 2.06* 2.27*  CALCIUM 9.5 9.3  MG  --  2.3   Liver Function Tests No results for input(s): AST, ALT, ALKPHOS, BILITOT, PROT, ALBUMIN in the last 72 hours.  No results for input(s): LIPASE, AMYLASE in the last 72 hours. Cardiac Enzymes No results for input(s): CKTOTAL, CKMB, CKMBINDEX, TROPONINI in the last 72 hours.  BNP: BNP (last 3 results) Recent Labs    06/23/21 1707  BNP 186.0*    ProBNP (last 3 results) No results for input(s): PROBNP in the last 8760 hours.   D-Dimer No results for input(s): DDIMER in the last 72 hours. Hemoglobin A1C No results for input(s): HGBA1C in the last 72 hours. Fasting Lipid Panel No results for input(s): CHOL, HDL, LDLCALC, TRIG, CHOLHDL, LDLDIRECT in the last 72 hours. Thyroid Function Tests No results for input(s): TSH, T4TOTAL, T3FREE, THYROIDAB in the last 72 hours.  Invalid input(s): FREET3   Other results:   Imaging    NM CARDIAC AMYLOID TUMOR LOC INFLAM SPECT 1 DAY  Result Date: 06/26/2021 CLINICAL DATA:  HEART FAILURE. CONCERN FOR CARDIAC AMYLOIDOSIS. EXAM: NUCLEAR MEDICINE TUMOR LOCALIZATION. PYP CARDIAC  AMYLOIDOSIS SCAN WITH SPECT TECHNIQUE: Following intravenous administration of radiopharmaceutical, anterior planar images of the chest were obtained. Regions of interest were placed on the heart and contralateral chest wall for quantitative assessment. Additional SPECT imaging of the chest was obtained. RADIOPHARMACEUTICALS:  20.8 mCi TECHNETIUM 99 PYROPHOSPHATE FINDINGS: Planar Visual assessment: Anterior planar imaging demonstrates radiotracer uptake within the heart less than than uptake within the adjacent ribs (Grade 1). Quantitative assessment : Quantitative assessment of the cardiac uptake compared to the contralateral chest wall is equal to (H/CL = 1.02). SPECT assessment:  SPECT imaging of the chest demonstrates clear radiotracer accumulation within the LEFT ventricle. IMPRESSION: Visual and quantitative assessment (grade 1, H/CLL equal 1.0) are equivocal for transthyretin amyloidosis. Electronically Signed   By: Kerby Moors M.D.   On: 06/26/2021 16:27     Medications:     Scheduled Medications:  allopurinol  100 mg Oral Daily   amiodarone  200 mg Oral Daily   atorvastatin  20 mg Oral QHS   calcitRIOL  0.25 mcg Oral Daily   dapagliflozin propanediol  10 mg Oral Daily   DULoxetine  60 mg Oral QHS   ferrous sulfate  325 mg Oral Q breakfast   magnesium oxide  400 mg Oral BID   melatonin  1.5 mg Oral QHS   omega-3 acid ethyl esters  2 g Oral Daily   potassium chloride SA  40 mEq Oral BID   predniSONE  5 mg Oral Daily   QUEtiapine  150 mg Oral QHS   sodium chloride flush  3 mL Intravenous Q12H   sodium chloride flush  3 mL Intravenous Q12H   tacrolimus  0.5 mg Oral QODAY   tacrolimus  1 mg Oral QODAY   torsemide  60 mg Oral Daily   vitamin B-12  500 mcg Oral Daily   warfarin  2 mg Oral Once per day on Sun Mon Wed Thu Fri Sat   [START ON 06/30/2021] warfarin  4 mg Oral Once per day on Tue   Warfarin - Pharmacist Dosing Inpatient   Does not apply q1600    Infusions:  sodium chloride     sodium chloride      PRN Medications: sodium chloride, sodium chloride, acetaminophen, albuterol, fluticasone, nitroGLYCERIN, ondansetron (ZOFRAN) IV, sodium chloride flush, sodium chloride flush, traZODone, zolpidem  Assessment/Plan   1. Acute on chronic HF with mid range EF: Ischemic cardiomyopathy.  Echo this admission with EF 45-50%, moderate LVH, RV normal, PASP 55 mmHg. Recent admission in 6/22 with CHF.  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  Also concern for possible cardiac amyloidosis with LVH.  RHC with elevated right and left heart filling pressures. PYP scan not suggestive of TTR cardiac  amyloidosis. On exam today, looks close to euvolemic.  Weight down another 3 lbs.  Creatinine mildly higher at 2.27.  - Myeloma panel/urine immunofixation pending for amyloidosis workup.  - Continue Farxiga 10 mg daily.   - Stop IV Lasix, start torsemide 60 mg daily for home.  - Will work on setting up Cardiomems placement as outpatient for home volume monitoring.  -? Untreated sleep apnea. 02 desaturations noted while sleeping. Sleep evaluation as outpatient. 2. CKD: Stage 3b.   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  Creatinine mildly higher at 2.27.  - Follows with Dr. Moshe Cipro, Nephrology following. Appreciate input. - On tacrolimus and prednisone.  - Transition to po diuretic today.  3. VT: Patient has history of VT, maintained on amiodarone.  4. Atrial fibrillation: Permanent x years.  Rate controlled. Unlikely to be able to regain NSR for any significant period of time, would not recommend ablation.  - Continue warfarin.   5. CAD: s/p CABG 1992.  Last LHC in 2019 in setting of VT with patent LIMA to LAD, patent SVG to PDA, moderate stenosis SVG to ramus, occluded SVG to OM1 (medical therapy suggested d/t comorbidities and thrombocytopenia with need for anticoagulation).  No chest pain.  Mildly elevated HS-TnI likely due to demand ischemia from volume overload.  - Continue statin.  - On warfarin so ASA not needed.  6. S/p liver transplant: followed at [facility]  7. Depression 8. Nocturnal hypoxemia -02 desaturations noted while sleeping -? Sleep apnea as above  Disposition: - Can go home today from my standpoint.  - Seen by PT/OT, no needs identified. HH at D/C. -TOC/CSW following -PAN HF grant for [place] - Followup in CHF clinic and coumadin clinic to be arranged.  - Cardiac meds for home: torsemide 60 mg daily, KCl 40 daily, Farxiga 10 mg daily, warfarin, atorvastatin 20 daily, amiodarone 200 daily.   Length of Stay: [place],  MD  06/27/2021, 8:46 AM  Advanced Heart Failure Team Pager [phone] (M-F; 7a - 5p)  Please contact [facility] for night-coverage after hours (5p -7a ) and weekends on [email or web address]

## 2021-06-27 NOTE — Progress Notes (Signed)
Went over discharge instructions with the patient, patient's brother and sister in law at bedside. Patient is made aware of medication changes and to pick up medication at the [facility] in [place]. PIV has been removed, site was bleeding, held pressure and wrapped kerlex to compressed. Tele monitor removed and CCMD notified. Patient is getting dressed.

## 2021-06-27 NOTE — Progress Notes (Signed)
O2 sats WNL throughout the night on room air.

## 2021-06-27 NOTE — Discharge Summary (Signed)
Discharge Summary  [name] VFI:[id number] DOB: Oct 18, 1951  PCP: [name]., MD  Admit date: 06/23/2021 Discharge date: 06/27/2021  Time spent: 48mns, more than 50% time spent on coordination of care.   Recommendations for Outpatient Follow-up:  F/u with PCP within a week  for hospital discharge follow up, repeat cbc/bmp at follow up F/u with heart failure clinic as scheduled . F/u with coumadin clinc Home health RN for heart failure management Outpatient palliative care referral to be arranged by transitional care, order placed   Please follow up on the following unresulted labs : Unresulted Labs (From admission, onward)     Start     Ordered   06/25/21 1710  Multiple Myeloma Panel (SPEP&IFE w/QIG)  Once,   R        06/25/21 1709         06/24/21 1503  Immunofixation electrophoresis  Once,   R        06/24/21 1503   06/24/21 1501  UPEP/UIFE/Light Chains/TP, 24-Hr Ur  Once,   R        06/24/21 1503   06/24/21 1500  Protein electrophoresis, serum  Once,   R        06/24/21 1503                        Discharge Diagnoses:  Active Hospital Problems   Diagnosis Date Noted   Acute on chronic systolic CHF (congestive heart failure) ([place] 06/23/2021   Gout 12/17/2016   History of liver transplant ([place] 12/17/2016   History of kidney transplant 12/17/2016   CKD (chronic kidney disease), stage III ([place] 12/17/2016   Acute respiratory failure with hypoxia ([place] 12/17/2016   Idiopathic thrombocytopenia (HCC)    HTN (hypertension)    HLD (hyperlipidemia)    CAD (coronary artery disease)    Goals of care, counseling/discussion 11/07/2015   [place]rial fibrillation ([place] 08/08/2009    Resolved Hospital Problems  No resolved problems to display.    Discharge Condition: stable  Diet recommendation: heart healthy  Filed Weights   06/25/21 0204 06/26/21 0032 06/27/21 0339  Weight: 86.7 kg 86.5 kg 85 kg    History of present illness: ( per cardiology  admitting note) [name] a 70y.o. male seen in followup for permanent atrial fibrillation in the context of CAD with prior CABG and EF 41%;  he has a history of kidney and liver transplant 2008 [facility] and was short of breath and was hospitalized.  [facility]  Echo is as below.  Medications were adjusted.  Because of post discharge hypotension metoprolol and losartan were discontinued  They also stopped his metolazone which he resumed on his own because of worsening dyspnea.    Hospital Course:  Principal Problem:   Acute on chronic systolic CHF (congestive heart failure) (HCC) Active Problems:   Goals of care, counseling/discussion   Idiopathic thrombocytopenia (HCC)   HTN (hypertension)   HLD (hyperlipidemia)   CAD (coronary artery disease)   Atrial fibrillation (HCC)   Gout   History of liver transplant ([facility]   History of kidney transplant   Acute respiratory failure with hypoxia (HCC)   CKD (chronic kidney disease), stage III (HCC)   Acute on chronic systolic CHF exacerbation, Right side pleural effusion, acute hypoxic respiratory failure -Initially O2 sats 85 to 86% on room air, required 2 L oxygen  supplement, has improved , weaned off oxygen , he is on room air at discharge -S/p right side cardiac cath on 8/18 Management per cardiology, was treated with Lasix IV 80 mg twice a day,  now on room air, no hypoxia Undergoing work-up for amyloidosis Cardiology cleared him to discharge home on meds listed below, he is to close follow up with cardiology   History of CAD status post CABG Denies chest pain On Lipitor   Chronic A. Fib, history of VT Currently on amiodarone, in A. Fib, rate controlled, on Coumadin.   CKD 3B, history of liver kidney transplant, immunosuppressed status Nephrology consulted   creatinine close to baseline On tacrolimus and prednisone   Chronic thrombocytopenia Appear at  baseline No sign of bleeding   History of gout, continue allopurinol, stable no acute issues       Body mass index is 27.38 kg/m.[name]  Consultants:  Cardiology is primary Nephrology as consultant [facility] hospitalist is consulted   Procedures:  Right side Cardiac cath on 8/18   Antimicrobials:   none   Discharge Exam: BP 124/60 (BP Location: Left Arm)   Pulse 85   Temp 97.9 F (36.6 C) (Oral)   Resp 17   Ht _0  (1.778 m)   Wt 85 kg   SpO2 97%   BMI 26.90 kg/m   General: NAD Cardiovascular: IRRR Respiratory: normal respiratory effort , no edema  Discharge Instructions You were cared for by a hospitalist during your hospital stay. If you have any questions about your discharge medications or the care you received while you were in the hospital after you are discharged, you can call the unit and asked to speak with the hospitalist on call if the hospitalist that took care of you is not available. Once you are discharged, your primary care physician will handle any further medical issues. Please note that NO REFILLS for any discharge medications will be authorized once you are discharged, as it is imperative that you return to your primary care physician (or establish a relationship with a primary care physician if you do not have one) for your aftercare needs so that they can reassess your need for medications and monitor your lab values.  Discharge Instructions     Diet - low sodium heart healthy   Complete by: As directed    Increase activity slowly   Complete by: As directed       Allergies as of 06/27/2021       Reactions   Other Anaphylaxis   Spider venom   Grapefruit Concentrate Other (See Comments)   Pt has been told not to eat grapefruit   Haloperidol Other (See Comments)   Caused the patient to be overly-sedated   Nsaids Other (See Comments)   Cannot take due to liver transplant   Rifampin Other (See Comments)   Caused flushing   Triazolam Other (See  Comments)   Caused the patient to be overly-sedated        Medication List     STOP taking these medications    carvedilol 6.25 MG tablet Commonly known as: COREG   furosemide 40 MG tablet Commonly known as: LASIX   losartan 25 MG tablet Commonly known as: COZAAR   metolazone 5 MG tablet Commonly known as: ZAROXOLYN       TAKE these medications    acetaminophen 500 MG tablet Commonly known as: TYLENOL Take 1,000 mg by mouth every 6 (six) hours as needed for mild pain or headache.  albuterol (2.5 MG/3ML) 0.083% nebulizer solution Commonly known as: PROVENTIL Take 2.5 mg by nebulization every 2 (two) hours as needed for wheezing or shortness of breath.   allopurinol 100 MG tablet Commonly known as: ZYLOPRIM Take 100 mg by mouth daily.   amiodarone 200 MG tablet Commonly known as: PACERONE TAKE 1 TABLET BY MOUTH EVERY DAY   atorvastatin 20 MG tablet Commonly known as: LIPITOR Take 20 mg by mouth at bedtime.   calcitRIOL 0.25 MCG capsule Commonly known as: ROCALTROL Take 0.25 mcg by mouth daily.   dapagliflozin propanediol 10 MG Tabs tablet Commonly known as: FARXIGA Take 1 tablet (10 mg total) by mouth daily. Start taking on: June 28, 2021   DULoxetine 60 MG capsule Commonly known as: CYMBALTA Take 60 mg by mouth at bedtime.   EPINEPHrine 0.3 mg/0.3 mL Devi Commonly known as: EPI-PEN Inject 0.3 mg into the muscle once as needed (severe allergic reaction).   ferrous sulfate 325 (65 FE) MG tablet Take 325 mg by mouth daily with breakfast.   fish oil-omega-3 fatty acids 1000 MG capsule Take 2 g by mouth daily.   fluticasone 50 MCG/ACT nasal spray Commonly known as: FLONASE Place 2 sprays into both nostrils daily as needed for allergies or rhinitis.   MAGnesium-Oxide 400 (240 Mg) MG tablet Generic drug: magnesium oxide Take 400 mg by mouth 2 (two) times daily.   melatonin 5 MG Tabs Take 5 mg by mouth at bedtime.   nitroGLYCERIN 0.4 MG SL  tablet Commonly known as: NITROSTAT Place 0.4 mg under the tongue every 5 (five) minutes as needed for chest pain.   potassium chloride SA 20 MEQ tablet Commonly known as: KLOR-CON Take 2 tablets (40 mEq total) by mouth daily. What changed: when to take this   predniSONE 5 MG tablet Commonly known as: DELTASONE Take 1 tablet (5 mg total) by mouth daily.   QUEtiapine 50 MG tablet Commonly known as: SEROQUEL Take 150 mg by mouth at bedtime.   tacrolimus 0.5 MG capsule Commonly known as: PROGRAF Take 0.5 mg by mouth See admin instructions. Takes 1 tablet by mouth one day and 2 tablets next day (1 and 2 tablets on alternate days)   Torsemide 60 MG Tabs Take 60 mg by mouth daily. Start taking on: June 28, 2021   traZODone 150 MG tablet Commonly known as: DESYREL Take 150 mg by mouth at bedtime as needed for sleep.   Vitamin B12 500 MCG Tabs Take 500 mcg by mouth daily.   Vitamin D3 25 MCG (1000 UT) Caps Take 1,000 Units by mouth daily.   warfarin 4 MG tablet Commonly known as: COUMADIN Take as directed. If you are unsure how to take this medication, talk to your nurse or doctor. Original instructions: TAKE AS DIRECTED BY COUMADIN CLINIC What changed: See the new instructions.               Durable Medical Equipment  (From admission, onward)           Start     Ordered   06/25/21 1629  Heart failure home health orders  (Heart failure home health orders / Face to face)  Once       Comments: Heart Failure Follow-up Care:  Verify follow-up appointments per Patient Discharge Instructions. Confirm transportation arranged. Reconcile home medications with discharge medication list. Remove discontinued medications from use. Assist patient/caregiver to manage medications using pill box. Reinforce low sodium food selection Assessments: Vital signs and oxygen saturation at each  visit. Assess home environment for safety concerns, caregiver support and availability of  low-sodium foods. Consult Education officer, museum, PT/OT, Dietitian, and CNA based on assessments. Perform comprehensive cardiopulmonary assessment. Notify MD for any change in condition or weight gain of 3 pounds in one day or 5 pounds in one week with symptoms. Daily Weights and Symptom Monitoring: Ensure patient has access to scales. Teach patient/caregiver to weigh daily before breakfast and after voiding using same scale and record.    Teach patient/caregiver to track weight and symptoms and when to notify Provider. Activity: Develop individualized activity plan with patient/caregiver.   Question Answer Comment  Heart Failure Follow-up Care [facility] at [phone]   Lab frequency Other see comments   Fax lab results to AHF Clinic at [phone]   Diet Low Sodium Heart Healthy   Fluid restrictions: 1800 mL Fluid      06/25/21 1629           Allergies  Allergen Reactions   Other Anaphylaxis    Spider venom   Grapefruit Concentrate Other (See Comments)    Pt has been told not to eat grapefruit   Haloperidol Other (See Comments)    Caused the patient to be overly-sedated   Nsaids Other (See Comments)    Cannot take due to liver transplant   Rifampin Other (See Comments)    Caused flushing   Triazolam Other (See Comments)    Caused the patient to be overly-sedated    Follow-up Information     [facility].   Specialty: Home Health Services Why: [address], [place] [address] [phone] [place] will call to arrange visit        [facility] Follow up.   Specialty: Cardiology Why: July 10, 2021 at 9:30 AM  The [facility] at [facility], [place] Lemma information: [address] [phone] Danice Goltz [place]        [name]., MD Follow up in 1 week(s).    Specialty: Family Medicine Why: hospital discharge follow up Contact information: [address] [place] [address] [phone]         f/u with coumadin clinc Follow up.                   The results of significant diagnostics from this hospitalization (including imaging, microbiology, ancillary and laboratory) are listed below for reference.    Significant Diagnostic Studies: NM CARDIAC AMYLOID TUMOR LOC INFLAM SPECT 1 DAY  Result Date: 06/26/2021 CLINICAL DATA:  HEART FAILURE. CONCERN FOR CARDIAC AMYLOIDOSIS. EXAM: NUCLEAR MEDICINE TUMOR LOCALIZATION. PYP CARDIAC AMYLOIDOSIS SCAN WITH SPECT TECHNIQUE: Following intravenous administration of radiopharmaceutical, anterior planar images of the chest were obtained. Regions of interest were placed on the heart and contralateral chest wall for quantitative assessment. Additional SPECT imaging of the chest was obtained. RADIOPHARMACEUTICALS:  20.8 mCi TECHNETIUM 99 PYROPHOSPHATE FINDINGS: Planar Visual assessment: Anterior planar imaging demonstrates radiotracer uptake within the heart less than than uptake within the adjacent ribs (Grade 1). Quantitative assessment : Quantitative assessment of the cardiac uptake compared to the contralateral chest wall is equal to (H/CL = 1.02). SPECT assessment: SPECT imaging of the chest demonstrates clear radiotracer accumulation within the LEFT ventricle. IMPRESSION: Visual and quantitative assessment (grade 1, H/CLL equal 1.0) are equivocal for transthyretin amyloidosis. Electronically Signed   By: Queen Slough.D.  On: 06/26/2021 16:27   CARDIAC CATHETERIZATION  Result Date: 06/25/2021 1. Elevated right and left heart filling pressures. 2. Primarily pulmonary venous hypertension. 3. Preserved cardiac output.   DG Chest Port 1 View  Result Date: 06/23/2021 CLINICAL DATA:  Shortness of breath EXAM: PORTABLE CHEST 1 VIEW COMPARISON:  Chest radiograph 08/29/2020 FINDINGS: The heart is  significantly enlarged, similar to the prior study. The mediastinal contours are stable. Median sternotomy wires and mediastinal surgical clips are stable. There are increased interstitial markings bilaterally. There are patchy opacities in the right base with a small right pleural effusion. The left costophrenic angle is cut off; a left pleural effusion cannot be excluded. The left upper lung is well aerated. There is no pneumothorax. The bones are unremarkable. IMPRESSION: 1. Small right pleural effusion with adjacent airspace disease which may reflect atelectasis or pneumonia. Recommend follow-up radiographs in 6-8 weeks to assess for resolution. 2. Cardiomegaly with mild pulmonary interstitial edema. 3. A left pleural effusion cannot be excluded as the costophrenic angle is not included in the field of view. Electronically Signed   By: Valetta Mole M.D.   On: 06/23/2021 16:49   ECHOCARDIOGRAM COMPLETE  Result Date: 06/24/2021    ECHOCARDIOGRAM REPORT   Patient Name:   [name] Date of Exam: 06/24/2021 Medical Rec #:  [id number]          Height:       70.0 in Accession #:    [id number]         Weight:       193.1 lb Date of Birth:  1951/06/18          BSA:          2.056 m Patient Age:    70 years           BP:           135/108 mmHg Patient Gender: M                  HR:           77 bpm. Exam Location:  Inpatient Procedure: 2D Echo, Cardiac Doppler, Color Doppler and 3D Echo Indications:    CHF  History:        Patient has prior history of Echocardiogram examinations, most                 recent 08/06/2018.  Sonographer:    Merrie Roof RDCS Referring Phys: [place]  1. Inferoseptal and basal to mid inferior and posterolateral hypokinesis. Left ventricular ejection fraction, by estimation, is 45 to 50%. The left ventricle has mildly decreased function. The left ventricle demonstrates regional wall motion abnormalities (see scoring diagram/findings for description). There is  moderate concentric left ventricular hypertrophy. Left ventricular diastolic parameters are indeterminate.  2. Right ventricular systolic function is normal. The right ventricular size is normal. There is moderately elevated pulmonary artery systolic pressure.  3. Left atrial size was severely dilated.  4. The mitral valve is normal in structure. Trivial mitral valve regurgitation. No evidence of mitral stenosis.  5. The aortic valve is tricuspid. Aortic valve regurgitation is mild. No aortic stenosis is present. Aortic regurgitation PHT measures 504 msec. Aortic valve area, by VTI measures 1.81 cm. Aortic valve mean gradient measures 7.0 mmHg. Aortic valve Vmax  measures 1.95 m/s.  6. The inferior vena cava is normal in size with greater than 50% respiratory variability, suggesting right atrial pressure of 3 mmHg. FINDINGS  Left Ventricle: Inferoseptal and basal to mid inferior and posterolateral hypokinesis. Left ventricular ejection fraction, by estimation, is 45 to 50%. The left ventricle has mildly decreased function. The left ventricle demonstrates regional wall motion abnormalities. The left ventricular internal cavity size was normal in size. There is moderate concentric left ventricular hypertrophy. Left ventricular diastolic parameters are indeterminate. Right Ventricle: The right ventricular size is normal. No increase in right ventricular wall thickness. Right ventricular systolic function is normal. There is moderately elevated pulmonary artery systolic pressure. The tricuspid regurgitant velocity is 3.62 m/s, and with an assumed right atrial pressure of 3 mmHg, the estimated right ventricular systolic pressure is 06.2 mmHg. Left Atrium: Left atrial size was severely dilated. Right Atrium: Right atrial size was normal in size. Pericardium: There is no evidence of pericardial effusion. Mitral Valve: The mitral valve is normal in structure. Trivial mitral valve regurgitation. No evidence of mitral valve  stenosis. Tricuspid Valve: The tricuspid valve is normal in structure. Tricuspid valve regurgitation is mild . No evidence of tricuspid stenosis. Aortic Valve: The aortic valve is tricuspid. Aortic valve regurgitation is mild. Aortic regurgitation PHT measures 504 msec. No aortic stenosis is present. Aortic valve mean gradient measures 7.0 mmHg. Aortic valve peak gradient measures 15.2 mmHg. Aortic valve area, by VTI measures 1.81 cm. Pulmonic Valve: The pulmonic valve was normal in structure. Pulmonic valve regurgitation is trivial. No evidence of pulmonic stenosis. Aorta: The aortic root is normal in size and structure. Venous: The inferior vena cava is normal in size with greater than 50% respiratory variability, suggesting right atrial pressure of 3 mmHg. IAS/Shunts: No atrial level shunt detected by color flow Doppler.  LEFT VENTRICLE PLAX 2D LVIDd:         5.80 cm LVIDs:         3.80 cm LV PW:         1.30 cm LV IVS:        1.40 cm LVOT diam:     2.00 cm  3D Volume EF: LV SV:         64       3D EF:        56 % LV SV Index:   31       LV EDV:       192 ml LVOT Area:     3.14 cm LV ESV:       85 ml                         LV SV:        108 ml RIGHT VENTRICLE RV Basal diam:  3.30 cm RV S prime:     7.07 cm/s TAPSE (M-mode): 1.3 cm LEFT ATRIUM              Index       RIGHT ATRIUM           Index LA diam:        6.10 cm  2.97 cm/m  RA Area:     17.30 cm LA Vol (A2C):   110.0 ml 53.49 ml/m RA Volume:   47.20 ml  22.95 ml/m LA Vol (A4C):   130.0 ml 63.22 ml/m LA Biplane Vol: 130.0 ml 63.22 ml/m  AORTIC VALVE AV Area (Vmax):    1.63 cm AV Area (Vmean):   1.83 cm AV Area (VTI):     1.81 cm AV Vmax:  195.00 cm/s AV Vmean:          119.000 cm/s AV VTI:            0.356 m AV Peak Grad:      15.2 mmHg AV Mean Grad:      7.0 mmHg LVOT Vmax:         101.00 cm/s LVOT Vmean:        69.500 cm/s LVOT VTI:          0.205 m LVOT/AV VTI ratio: 0.58 AI PHT:            504 msec  AORTA Ao Root diam: 3.10 cm  TRICUSPID VALVE TR Peak grad:   52.4 mmHg TR Vmax:        362.00 cm/s  SHUNTS Systemic VTI:  0.20 m Systemic Diam: 2.00 cm Skeet Latch MD Electronically signed by Skeet Latch MD Signature Date/Time: 06/24/2021/12:24:55 PM    Final     Microbiology: Recent Results (from the past 240 hour(s))  Resp Panel by RT-PCR (Flu A&B, Covid) Nasopharyngeal Swab     Status: None   Collection Time: 06/23/21  5:12 PM   Specimen: Nasopharyngeal Swab; Nasopharyngeal(NP) swabs in vial transport medium  Result Value Ref Range Status   SARS Coronavirus 2 by RT PCR NEGATIVE NEGATIVE Final    Comment: (NOTE) SARS-CoV-2 target nucleic acids are NOT DETECTED.  The SARS-CoV-2 RNA is generally detectable in upper respiratory specimens during the acute phase of infection. The lowest concentration of SARS-CoV-2 viral copies this assay can detect is 138 copies/mL. A negative result does not preclude SARS-Cov-2 infection and should not be used as the sole basis for treatment or other patient management decisions. A negative result may occur with  improper specimen collection/handling, submission of specimen other than nasopharyngeal swab, presence of viral mutation(s) within the areas targeted by this assay, and inadequate number of viral copies(<138 copies/mL). A negative result must be combined with clinical observations, patient history, and epidemiological information. The expected result is Negative.  Fact Sheet for Patients:  EntrepreneurPulse.com.au  Fact Sheet for Healthcare Providers:  IncredibleEmployment.be  This test is no t yet approved or cleared by the [place] FDA and  has been authorized for detection and/or diagnosis of SARS-CoV-2 by FDA under an Emergency Use Authorization (EUA). This EUA will remain  in effect (meaning this test can be used) for the duration of the COVID-19 declaration under Section 564(b)(1) of the Act, 21 U.S.C.section  360bbb-3(b)(1), unless the authorization is terminated  or revoked sooner.       Influenza A by PCR NEGATIVE NEGATIVE Final   Influenza B by PCR NEGATIVE NEGATIVE Final    Comment: (NOTE) The Xpert Xpress SARS-CoV-2/FLU/RSV plus assay is intended as an aid in the diagnosis of influenza from Nasopharyngeal swab specimens and should not be used as a sole basis for treatment. Nasal washings and aspirates are unacceptable for Xpert Xpress SARS-CoV-2/FLU/RSV testing.  Fact Sheet for Patients: EntrepreneurPulse.com.au  Fact Sheet for Healthcare Providers: IncredibleEmployment.be  This test is not yet approved or cleared by the [place] FDA and has been authorized for detection and/or diagnosis of SARS-CoV-2 by FDA under an Emergency Use Authorization (EUA). This EUA will remain in effect (meaning this test can be used) for the duration of the COVID-19 declaration under Section 564(b)(1) of the Act, 21 U.S.C. section 360bbb-3(b)(1), unless the authorization is terminated or revoked.  Performed at [facility], [place] [address], [place], [place] [address]  Labs: Basic Metabolic Panel: Recent Labs  Lab 06/23/21 1707 06/24/21 0255 06/25/21 0118 06/25/21 0810 06/26/21 0342 06/27/21 0259  NA 139 139 141 143  143 139 136  K 3.8 3.4* 3.6 3.8  3.8 3.9 3.2*  CL 101 101 100  --  97* 94*  CO2 29 31 33*  --  33* 31  GLUCOSE 125* 117* 131*  --  115* 139*  BUN 24* 23 25*  --  31* 35*  CREATININE 1.91* 2.03* 2.17*  --  2.06* 2.27*  CALCIUM 9.5 9.4 9.5  --  9.5 9.3  MG  --  2.0  --   --   --  2.3   Liver Function Tests: Recent Labs  Lab 06/23/21 1707  AST 17  ALT 16  ALKPHOS 62  BILITOT 2.0*  PROT 7.0  ALBUMIN 3.5   No results for input(s): LIPASE, AMYLASE in the last 168 hours. No results for input(s): AMMONIA in the last 168 hours. CBC: Recent Labs  Lab 06/23/21 1707 06/25/21 0118 06/25/21 0810 06/26/21 0342  06/27/21 0259  WBC 5.5 5.0  --  4.8 5.4  NEUTROABS 4.4 3.6  --  3.4 3.7  HGB 13.4 11.8* 12.2*  12.2* 12.6* 13.1  HCT 42.3 37.3* 36.0*  36.0* 39.5 40.3  MCV 89.2 88.2  --  89.0 87.2  PLT 73* 76*  --  80* 90*   Cardiac Enzymes: No results for input(s): CKTOTAL, CKMB, CKMBINDEX, TROPONINI in the last 168 hours. BNP: BNP (last 3 results) Recent Labs    06/23/21 1707  BNP 186.0*    ProBNP (last 3 results) No results for input(s): PROBNP in the last 8760 hours.  CBG: No results for input(s): GLUCAP in the last 168 hours.     Signed:  Florencia Reasons MD, PhD, FACP  [facility] 06/27/2021, 11:45 AM

## 2021-06-27 NOTE — Plan of Care (Signed)

## 2021-06-30 LAB — MULTIPLE MYELOMA PANEL, SERUM
Albumin SerPl Elph-Mcnc: 3.6 g/dL (ref 2.9–4.4)
Albumin/Glob SerPl: 1.3 (ref 0.7–1.7)
Alpha 1: 0.2 g/dL (ref 0.0–0.4)
Alpha2 Glob SerPl Elph-Mcnc: 0.6 g/dL (ref 0.4–1.0)
B-Globulin SerPl Elph-Mcnc: 0.8 g/dL (ref 0.7–1.3)
Gamma Glob SerPl Elph-Mcnc: 1.3 g/dL (ref 0.4–1.8)
Globulin, Total: 2.9 g/dL (ref 2.2–3.9)
IgA: 176 mg/dL (ref 61–437)
IgG (Immunoglobin G), Serum: 1266 mg/dL (ref 603–1613)
IgM (Immunoglobulin M), Srm: 105 mg/dL (ref 20–172)
M Protein SerPl Elph-Mcnc: 0.8 g/dL — ABNORMAL HIGH
Total Protein ELP: 6.5 g/dL (ref 6.0–8.5)

## 2021-06-30 LAB — PROTEIN ELECTROPHORESIS, SERUM

## 2021-07-01 LAB — UPEP/UIFE/LIGHT CHAINS/TP, 24-HR UR
% BETA, Urine: 0 %
ALPHA 1 URINE: 0 %
Albumin, U: 100 %
Alpha 2, Urine: 0 %
Free Kappa Lt Chains,Ur: 7.94 mg/L (ref 1.17–86.46)
Free Kappa/Lambda Ratio: 5.51 (ref 1.83–14.26)
Free Lambda Lt Chains,Ur: 1.44 mg/L (ref 0.27–15.21)
GAMMA GLOBULIN URINE: 0 %
Total Protein, Urine-Ur/day: 143 mg/24 hr (ref 30–150)
Total Protein, Urine: 6.8 mg/dL
Total Volume: 2100

## 2021-07-02 ENCOUNTER — Telehealth: Payer: Self-pay | Admitting: *Deleted

## 2021-07-02 ENCOUNTER — Ambulatory Visit (INDEPENDENT_AMBULATORY_CARE_PROVIDER_SITE_OTHER): Payer: Medicare Other

## 2021-07-02 DIAGNOSIS — I4821 Permanent atrial fibrillation: Secondary | ICD-10-CM | POA: Diagnosis not present

## 2021-07-02 DIAGNOSIS — Z5181 Encounter for therapeutic drug level monitoring: Secondary | ICD-10-CM | POA: Diagnosis not present

## 2021-07-02 LAB — POCT INR: INR: 3.4 — AB (ref 2.0–3.0)

## 2021-07-02 NOTE — Telephone Encounter (Signed)
Called pt since he was recently in the Hospital and has been discharged. Spoke with pt and advised we need to check his INR today and he stated he would do it today. Will await results.

## 2021-07-02 NOTE — Patient Instructions (Signed)
Description   Spoke with patient and instructed pt to hold Warfarin today and then resume Warfarin 2mg  daily except 4mg  on Tuesdays. Remain consistent with your green leafy vegetables. Recheck INR in 1 week, self tester for insurance purposes. [phone]

## 2021-07-03 ENCOUNTER — Telehealth (HOSPITAL_COMMUNITY): Payer: Self-pay | Admitting: Pharmacy Technician

## 2021-07-03 ENCOUNTER — Other Ambulatory Visit (HOSPITAL_COMMUNITY): Payer: Self-pay | Admitting: *Deleted

## 2021-07-03 ENCOUNTER — Other Ambulatory Visit (HOSPITAL_COMMUNITY): Payer: Self-pay

## 2021-07-03 MED ORDER — DAPAGLIFLOZIN PROPANEDIOL 10 MG PO TABS: 10.0000 mg | ORAL_TABLET | Freq: Every day | ORAL | 6 refills | Status: AC

## 2021-07-03 NOTE — Telephone Encounter (Signed)
Opened in error

## 2021-07-07 LAB — IMMUNOFIXATION ELECTROPHORESIS
IgA: 186 mg/dL (ref 61–437)
IgG (Immunoglobin G), Serum: 1360 mg/dL (ref 603–1613)
IgM (Immunoglobulin M), Srm: 98 mg/dL (ref 20–172)
Total Protein ELP: 6.3 g/dL (ref 6.0–8.5)

## 2021-07-08 ENCOUNTER — Telehealth: Payer: Self-pay | Admitting: Oncology

## 2021-07-08 NOTE — Telephone Encounter (Signed)
Patient called to rescheduled Canceled Appt's.  Appt made 07/17/21 Labs 3:45 pm - Follow Up w/Dr Hinton Rao 4:30 pm

## 2021-07-09 ENCOUNTER — Telehealth: Payer: Self-pay

## 2021-07-09 NOTE — Progress Notes (Signed)
[facility]  [address] [place],  [place]  [address] [phone]  Clinic Day:  07/17/2021  Referring physician: [name]., MD  This document serves as a record of services personally performed by Hosie Poisson, MD. It was created on their behalf by [facility], a trained medical scribe. The creation of this record is based on the scribe's personal observations and the provider's statements to them.  CHIEF COMPLAINT:  CC: Chronic ITP  Current Treatment:  Surveillance   HISTORY OF PRESENT ILLNESS:  [name] is a 70 y.o. male with multiple medical comorbidities.  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  He has never required a splenectomy.  We began seeing him in December 2003, because of leukopenia and thrombocytopenia.  His platelet count fluctuates up and down, but has never been in a dangerous range.  He has a history of both liver and kidney transplantation and immunosuppressive drugs.  He is on tacrolimus 0.5 mg twice daily, as well as prednisone.  He has also had B12 deficiency in the past, but currently is off injections and taking oral B12.  He was also found to have a monoclonal gammopathy of uncertain significance, IgG kappa, in 2011, which  has remained stable.  He has had osteopenia and had been on Reclast yearly, with the last dose in June 2017.  This was discontinued when his bone density normalized.  Repeat bone density in May 2017 revealed osteopenia with a T-score of -1.5 in the femur and a T-score of -1.3 in the radius.  However, he had dental extractions and did not heal well, which the dentist felt was related to the previous Reclast, so this was not resumed.    He was hospitalized in February 2019 at [facility] with shortness of breath and fever and problems with fluid overload.  He had been on furosemide 40 mg daily.  He was referred to a pulmonologist at [facility].  He lost his mother  in April 2019 and they were very close.  At his visit in May, he had new leukopenia.  B12, folate and CMV titers were normal.  The leukopenia resolved at his visit in November and has not recurred.  He was hospitalized again in September 2019 with pneumonia and ventricular tachycardia.  CT imaging in the emergency department did not reveal any acute findings there was splenomegaly with a stable 11 mm hypodensity, which is felt to represent a cyst or hemangioma.  On review of his CT scan from April 2019 in the emergency department, the spleen did appear enlarged at that time as well.  He continues to follow with cardiologist, Dr. [name] regularly.  He states he is up-to-date on screening colonoscopy.  He continues to see his nephrologist on a regular basis and they have been concerned about the fact that his creatinine now runs 1.5-2, when it used to run less than that.  INTERVAL HISTORY:  [name] is here for annual follow up and states that he is now being followed by the [facility] at [facility].  He was hospitalized twice within a month, once at [facility] and once at [facility], since his last visit. Last ECHO from August revealed an EF between 45-50%. He notes shortness of breath, but otherwise feels well. Platelet count is fairly stable at 75,000, and white count and platelets are normal.  Chemistries are unremarkable except for a creatinine of 2.0, previously 2.1, and a bilirubin of  1.8. His  appetite is good, and he has lost 4 pounds since his last visit.  He denies fever, chills or other signs of infection.  He denies nausea, vomiting, bowel issues, or abdominal pain.  He denies sore throat, cough or chest pain.  REVIEW OF SYSTEMS:  Review of Systems  Constitutional: Negative.  Negative for appetite change, chills, fatigue, fever and unexpected weight change.  HENT:  Negative.    Eyes: Negative.   Respiratory:  Positive for shortness of breath. Negative for chest tightness, cough, hemoptysis and  wheezing.   Cardiovascular: Negative.  Negative for chest pain, leg swelling and palpitations.  Gastrointestinal:  Positive for abdominal distention (and swelling). Negative for abdominal pain, blood in stool, constipation, diarrhea, nausea and vomiting.  Endocrine: Negative.   Genitourinary: Negative.  Negative for difficulty urinating, dysuria, frequency and hematuria.   Musculoskeletal: Negative.  Negative for arthralgias, back pain, flank pain, gait problem and myalgias.  Skin: Negative.   Neurological: Negative.  Negative for dizziness, extremity weakness, gait problem, headaches, light-headedness, numbness, seizures and speech difficulty.  Hematological: Negative.   Psychiatric/Behavioral: Negative.  Negative for depression and sleep disturbance. The patient is not nervous/anxious.   All other systems reviewed and are negative.   VITALS:  Blood pressure 132/60, pulse 77, temperature 98.4 F (36.9 C), temperature source Oral, resp. rate 18, height 5\' 10"  (1.778 m), weight 191 lb 14.4 oz (87 kg), SpO2 98 %.  Wt Readings from Last 3 Encounters:  07/17/21 191 lb 14.4 oz (87 kg)  07/16/21 193 lb (87.5 kg)  06/27/21 187 lb 8 oz (85 kg)    Body mass index is 27.53 kg/m.  Performance status (ECOG): 1 - Symptomatic but completely ambulatory  PHYSICAL EXAM:  Physical Exam Constitutional:      General: He is not in acute distress.    Appearance: Normal appearance. He is normal weight.  HENT:     Head: Normocephalic and atraumatic.  Eyes:     General: No scleral icterus.    Extraocular Movements: Extraocular movements intact.     Conjunctiva/sclera: Conjunctivae normal.     Pupils: Pupils are equal, round, and reactive to light.  Cardiovascular:     Rate and Rhythm: Normal rate and regular rhythm.     Pulses: Normal pulses.     Heart sounds: Normal heart sounds. No murmur heard.   No friction rub. No gallop.  Pulmonary:     Effort: Pulmonary effort is normal. No respiratory  distress.     Breath sounds: Normal breath sounds.  Abdominal:     General: Bowel sounds are normal. There is no distension.     Palpations: Abdomen is soft. There is no hepatomegaly, splenomegaly or mass.     Tenderness: There is no abdominal tenderness.     Comments: He has mild to moderate ascites.  He has multiple scars of the abdomen.  Musculoskeletal:        General: Normal range of motion.     Cervical back: Normal range of motion and neck supple.     Right lower leg: No edema.     Left lower leg: No edema.  Lymphadenopathy:     Cervical: No cervical adenopathy.  Skin:    General: Skin is warm and dry.  Neurological:     General: No focal deficit present.     Mental Status: He is alert and oriented to person, place, and time. Mental status is at baseline.  Psychiatric:  Mood and Affect: Mood normal.        Behavior: Behavior normal.        Thought Content: Thought content normal.        Judgment: Judgment normal.    LABS:   CBC Latest Ref Rng & Units 07/16/2021 06/27/2021 06/26/2021  WBC 4.0 - 10.5 K/uL 4.1 5.4 4.8  Hemoglobin 13.0 - 17.0 g/dL 13.6 13.1 12.6(L)  Hematocrit 39.0 - 52.0 % 45.2 40.3 39.5  Platelets 150 - 400 K/uL 75(L) 90(L) 80(L)   CMP Latest Ref Rng & Units 07/17/2021 07/16/2021 06/27/2021  Glucose 70 - 99 mg/dL - 130(H) 139(H)  BUN 4 - 21 19 16  35(H)  Creatinine 0.6 - 1.3 2.0(A) 2.05(H) 2.27(H)  Sodium 137 - 147 141 138 136  Potassium 3.4 - 5.3 4.1 4.6 3.2(L)  Chloride 99 - 108 105 102 94(L)  CO2 13 - 22 27(A) 29 31  Calcium 8.7 - 10.7 9.0 9.2 9.3  Total Protein 6.5 - 8.1 g/dL - - -  Total Bilirubin 0.3 - 1.2 mg/dL - - -  Alkaline Phos 25 - 125 70 - -  AST 14 - 40 24 - -  ALT 10 - 40 14 - -     No results found for: CEA1 / No results found for: CEA1 Lab Results  Component Value Date   PSA1 0.5 08/22/2019   No results found for: ZLD357 No results found for: SVX793  Lab Results  Component Value Date   TOTALPROTELP 6.5 06/25/2021    ALBUMINELP NOT PERFORMED 06/24/2021   A1GS NOT PERFORMED 06/24/2021   A2GS NOT PERFORMED 06/24/2021   BETS NOT PERFORMED 06/24/2021   GAMS NOT PERFORMED 06/24/2021   No results found for: TIBC, FERRITIN, IRONPCTSAT No results found for: LDH  STUDIES:  NM CARDIAC AMYLOID TUMOR LOC INFLAM SPECT 1 DAY  Result Date: 06/26/2021 CLINICAL DATA:  HEART FAILURE. CONCERN FOR CARDIAC AMYLOIDOSIS. EXAM: NUCLEAR MEDICINE TUMOR LOCALIZATION. PYP CARDIAC AMYLOIDOSIS SCAN WITH SPECT TECHNIQUE: Following intravenous administration of radiopharmaceutical, anterior planar images of the chest were obtained. Regions of interest were placed on the heart and contralateral chest wall for quantitative assessment. Additional SPECT imaging of the chest was obtained. RADIOPHARMACEUTICALS:  20.8 mCi TECHNETIUM 99 PYROPHOSPHATE FINDINGS: Planar Visual assessment: Anterior planar imaging demonstrates radiotracer uptake within the heart less than than uptake within the adjacent ribs (Grade 1). Quantitative assessment : Quantitative assessment of the cardiac uptake compared to the contralateral chest wall is equal to (H/CL = 1.02). SPECT assessment: SPECT imaging of the chest demonstrates clear radiotracer accumulation within the LEFT ventricle. IMPRESSION: Visual and quantitative assessment (grade 1, H/CLL equal 1.0) are equivocal for transthyretin amyloidosis. Electronically Signed   By: Kerby Moors M.D.   On: 06/26/2021 16:27   CARDIAC CATHETERIZATION  Result Date: 06/25/2021 1. Elevated right and left heart filling pressures. 2. Primarily pulmonary venous hypertension. 3. Preserved cardiac output.   DG Chest Port 1 View  Result Date: 06/23/2021 CLINICAL DATA:  Shortness of breath EXAM: PORTABLE CHEST 1 VIEW COMPARISON:  Chest radiograph 08/29/2020 FINDINGS: The heart is significantly enlarged, similar to the prior study. The mediastinal contours are stable. Median sternotomy wires and mediastinal surgical clips are  stable. There are increased interstitial markings bilaterally. There are patchy opacities in the right base with a small right pleural effusion. The left costophrenic angle is cut off; a left pleural effusion cannot be excluded. The left upper lung is well aerated. There is no pneumothorax. The bones are unremarkable. IMPRESSION:  1. Small right pleural effusion with adjacent airspace disease which may reflect atelectasis or pneumonia. Recommend follow-up radiographs in 6-8 weeks to assess for resolution. 2. Cardiomegaly with mild pulmonary interstitial edema. 3. A left pleural effusion cannot be excluded as the costophrenic angle is not included in the field of view. Electronically Signed   By: Valetta Mole M.D.   On: 06/23/2021 16:49   ECHOCARDIOGRAM COMPLETE  Result Date: 06/24/2021    ECHOCARDIOGRAM REPORT   Patient Name:   [name] Date of Exam: 06/24/2021 Medical Rec #:  [id number]          Height:       70.0 in Accession #:    [id number]         Weight:       193.1 lb Date of Birth:  08/03/1951          BSA:          2.056 m Patient Age:    80 years           BP:           135/108 mmHg Patient Gender: M                  HR:           77 bpm. Exam Location:  Inpatient Procedure: 2D Echo, Cardiac Doppler, Color Doppler and 3D Echo Indications:    CHF  History:        Patient has prior history of Echocardiogram examinations, most                 recent 08/06/2018.  Sonographer:    Merrie Roof RDCS Referring Phys: [place]  1. Inferoseptal and basal to mid inferior and posterolateral hypokinesis. Left ventricular ejection fraction, by estimation, is 45 to 50%. The left ventricle has mildly decreased function. The left ventricle demonstrates regional wall motion abnormalities (see scoring diagram/findings for description). There is moderate concentric left ventricular hypertrophy. Left ventricular diastolic parameters are indeterminate.  2. Right ventricular systolic function is  normal. The right ventricular size is normal. There is moderately elevated pulmonary artery systolic pressure.  3. Left atrial size was severely dilated.  4. The mitral valve is normal in structure. Trivial mitral valve regurgitation. No evidence of mitral stenosis.  5. The aortic valve is tricuspid. Aortic valve regurgitation is mild. No aortic stenosis is present. Aortic regurgitation PHT measures 504 msec. Aortic valve area, by VTI measures 1.81 cm. Aortic valve mean gradient measures 7.0 mmHg. Aortic valve Vmax  measures 1.95 m/s.  6. The inferior vena cava is normal in size with greater than 50% respiratory variability, suggesting right atrial pressure of 3 mmHg. FINDINGS  Left Ventricle: Inferoseptal and basal to mid inferior and posterolateral hypokinesis. Left ventricular ejection fraction, by estimation, is 45 to 50%. The left ventricle has mildly decreased function. The left ventricle demonstrates regional wall motion abnormalities. The left ventricular internal cavity size was normal in size. There is moderate concentric left ventricular hypertrophy. Left ventricular diastolic parameters are indeterminate. Right Ventricle: The right ventricular size is normal. No increase in right ventricular wall thickness. Right ventricular systolic function is normal. There is moderately elevated pulmonary artery systolic pressure. The tricuspid regurgitant velocity is 3.62 m/s, and with an assumed right atrial pressure of 3 mmHg, the estimated right ventricular systolic pressure is 87.5 mmHg. Left Atrium: Left atrial size was severely dilated. Right Atrium: Right atrial size was normal in size.  Pericardium: There is no evidence of pericardial effusion. Mitral Valve: The mitral valve is normal in structure. Trivial mitral valve regurgitation. No evidence of mitral valve stenosis. Tricuspid Valve: The tricuspid valve is normal in structure. Tricuspid valve regurgitation is mild . No evidence of tricuspid stenosis.  Aortic Valve: The aortic valve is tricuspid. Aortic valve regurgitation is mild. Aortic regurgitation PHT measures 504 msec. No aortic stenosis is present. Aortic valve mean gradient measures 7.0 mmHg. Aortic valve peak gradient measures 15.2 mmHg. Aortic valve area, by VTI measures 1.81 cm. Pulmonic Valve: The pulmonic valve was normal in structure. Pulmonic valve regurgitation is trivial. No evidence of pulmonic stenosis. Aorta: The aortic root is normal in size and structure. Venous: The inferior vena cava is normal in size with greater than 50% respiratory variability, suggesting right atrial pressure of 3 mmHg. IAS/Shunts: No atrial level shunt detected by color flow Doppler.  LEFT VENTRICLE PLAX 2D LVIDd:         5.80 cm LVIDs:         3.80 cm LV PW:         1.30 cm LV IVS:        1.40 cm LVOT diam:     2.00 cm  3D Volume EF: LV SV:         64       3D EF:        56 % LV SV Index:   31       LV EDV:       192 ml LVOT Area:     3.14 cm LV ESV:       85 ml                         LV SV:        108 ml RIGHT VENTRICLE RV Basal diam:  3.30 cm RV S prime:     7.07 cm/s TAPSE (M-mode): 1.3 cm LEFT ATRIUM              Index       RIGHT ATRIUM           Index LA diam:        6.10 cm  2.97 cm/m  RA Area:     17.30 cm LA Vol (A2C):   110.0 ml 53.49 ml/m RA Volume:   47.20 ml  22.95 ml/m LA Vol (A4C):   130.0 ml 63.22 ml/m LA Biplane Vol: 130.0 ml 63.22 ml/m  AORTIC VALVE AV Area (Vmax):    1.63 cm AV Area (Vmean):   1.83 cm AV Area (VTI):     1.81 cm AV Vmax:           195.00 cm/s AV Vmean:          119.000 cm/s AV VTI:            0.356 m AV Peak Grad:      15.2 mmHg AV Mean Grad:      7.0 mmHg LVOT Vmax:         101.00 cm/s LVOT Vmean:        69.500 cm/s LVOT VTI:          0.205 m LVOT/AV VTI ratio: 0.58 AI PHT:            504 msec  AORTA Ao Root diam: 3.10 cm TRICUSPID VALVE TR Peak grad:   52.4 mmHg TR Vmax:  362.00 cm/s  SHUNTS Systemic VTI:  0.20 m Systemic Diam: 2.00 cm Skeet Latch MD  Electronically signed by Skeet Latch MD Signature Date/Time: 06/24/2021/12:24:55 PM    Final       HISTORY:   Allergies:  Allergies  Allergen Reactions   Other Anaphylaxis    Spider venom   Grapefruit Concentrate Other (See Comments)    Pt has been told not to eat grapefruit   Haloperidol Other (See Comments)    Caused the patient to be overly-sedated   Nsaids Other (See Comments)    Cannot take due to liver transplant   Rifampin Other (See Comments)    Caused flushing   Triazolam Other (See Comments)    Caused the patient to be overly-sedated    Current Medications: Current Outpatient Medications  Medication Sig Dispense Refill   allopurinol (ZYLOPRIM) 100 MG tablet Take 100 mg by mouth daily.     amiodarone (PACERONE) 200 MG tablet TAKE 1 TABLET BY MOUTH EVERY DAY 30 tablet 5   atorvastatin (LIPITOR) 20 MG tablet Take 20 mg by mouth at bedtime.      calcitRIOL (ROCALTROL) 0.25 MCG capsule Take 0.25 mcg by mouth daily.     Cholecalciferol (VITAMIN D3) 25 MCG (1000 UT) CAPS Take 1,000 Units by mouth daily.      Cyanocobalamin (VITAMIN B12) 500 MCG TABS Take 500 mcg by mouth daily.      dapagliflozin propanediol (FARXIGA) 10 MG TABS tablet Take 1 tablet (10 mg total) by mouth daily. 30 tablet 6   DULoxetine (CYMBALTA) 60 MG capsule Take 60 mg by mouth at bedtime.     EPINEPHrine (EPI-PEN) 0.3 mg/0.3 mL DEVI Inject 0.3 mg into the muscle once as needed (severe allergic reaction).     ferrous sulfate 325 (65 FE) MG tablet Take 325 mg by mouth daily with breakfast.     fish oil-omega-3 fatty acids 1000 MG capsule Take 2 g by mouth daily.     fluticasone (FLONASE) 50 MCG/ACT nasal spray Place 2 sprays into both nostrils daily as needed for allergies or rhinitis.      MAGNESIUM-OXIDE 400 (240 Mg) MG tablet Take 400 mg by mouth 2 (two) times daily.     melatonin 5 MG TABS Take 5 mg by mouth at bedtime.     nitroGLYCERIN (NITROSTAT) 0.4 MG SL tablet Place 0.4 mg under the tongue  every 5 (five) minutes as needed for chest pain.     potassium chloride SA (KLOR-CON) 20 MEQ tablet Take 2 tablets (40 mEq total) by mouth daily. 40 tablet 0   predniSONE (DELTASONE) 5 MG tablet Take 1 tablet (5 mg total) by mouth daily.     QUEtiapine (SEROQUEL) 50 MG tablet Take 150 mg by mouth at bedtime.     tacrolimus (PROGRAF) 0.5 MG capsule Take 0.5 mg by mouth See admin instructions. Takes 1 tablet by mouth one day and 2 tablets next day (1 and 2 tablets on alternate days)     torsemide 60 MG TABS Take 60 mg by mouth daily. 30 tablet 0   traZODone (DESYREL) 150 MG tablet Take 150 mg by mouth at bedtime as needed for sleep.      warfarin (COUMADIN) 4 MG tablet TAKE AS DIRECTED BY COUMADIN CLINIC (Patient taking differently: Take 2-4 mg by mouth See admin instructions. Takes 4 mg on Tuesdays and 2 mg on all other days) 65 tablet 1   zolpidem (AMBIEN) 5 MG tablet Take 5 mg by mouth at  bedtime as needed.     No current facility-administered medications for this visit.     ASSESSMENT & PLAN:   Assessment:   1. Chronic ITP, which remains stable.  His platelet count fluctuates up and down, but usually runs in the 70,000 range.   2. Anemia, which is likely due to his chronic kidney disease.  3. Monoclonal gammopathy of uncertain significance, which has remained small and stable.  Serum protein electrophoresis and quantitative immunoglobulins are pending from today.  4. Immunosuppression for kidney and liver transplantation.  5. Chronic kidney disease.    6. Osteopenia, Reclast was discontinued due to poor healing after dental work.  Plan: As he is following with multiple physicians routinely and his monoclonal gammopathy remains stable, we will plan to see him back in 1 year with CBC, comprehensive metabolic profile, serum protein electrophoresis, serum light chains and quantitative immunoglobulins.  I will call him with the results of his SPEP that we drew today.  If the M-spike has  increased, I will need to see him back in 6 months.  The patient understands the plans discussed today and is in agreement with them.  He knows to contact our office if he develops concerns prior to his next appointment.   I provided 15 minutes of face-to-face time during this this encounter and > 50% was spent counseling as documented under my assessment and plan.    Derwood Kaplan, MD [facility] [address] [place] [address] Dept: [phone] Dept Fax: [phone]   I, Rita Ohara, am acting as scribe for Derwood Kaplan, MD  I have reviewed this report as typed by the medical scribe, and it is complete and accurate.

## 2021-07-10 ENCOUNTER — Ambulatory Visit (INDEPENDENT_AMBULATORY_CARE_PROVIDER_SITE_OTHER): Payer: Medicare Other | Admitting: *Deleted

## 2021-07-10 ENCOUNTER — Encounter (HOSPITAL_COMMUNITY): Payer: Medicare Other

## 2021-07-10 ENCOUNTER — Telehealth (HOSPITAL_COMMUNITY): Payer: Self-pay

## 2021-07-10 DIAGNOSIS — Z7901 Long term (current) use of anticoagulants: Secondary | ICD-10-CM | POA: Diagnosis not present

## 2021-07-10 DIAGNOSIS — Z7189 Other specified counseling: Secondary | ICD-10-CM

## 2021-07-10 LAB — POCT INR: INR: 1.9 — AB (ref 2.0–3.0)

## 2021-07-10 NOTE — Telephone Encounter (Signed)
-----   Message from Larey Dresser, MD sent at 07/08/2021  3:44 PM EDT ----- IgG monoclonal protein.  Needs referral to hematology for myeloma evaluation.

## 2021-07-10 NOTE — Patient Instructions (Signed)
Description   Spoke with patient and instructed pt to and to take 4mg  today then resume taking Warfarin 2mg  daily except 4mg  on Tuesdays. Remain consistent with your green leafy vegetables. Recheck INR in 1 week, self tester for insurance purposes. [phone]

## 2021-07-15 NOTE — H&P (View-Only) (Signed)
[name], MD Heme/Onc: Dr. Hosie Poisson HF Cardiologist: Dr. Aundra Dubin  HPI: [name] 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 [name], depression, chronic ITP. Has been on midodrine in past for hypotension, however; does not recall ever taking the medicine.    He has been a DNR. Reports poor quality of life for several years. Weak and dyspnea with minimal activity.  Had multiple admissions for acute on chronic CHF several years ago but none in the last 2 years until recently.   Patient admitted to [place] at end of June with acute on chronic CHF. Diuresed with IV lasix. Sent home on prior home dose furosemide at 40 mg BID.  Metolazone was stopped.  Echo with EF 45%. Coreg held d/t hypotension and started on Toprol xl at D/C. Not on SGLT2i d/t cost. Initiated on losartan.    Seen in ED 07/21 with lightheadedness and hypotension. No orthostasis. Losartan and metoprolol held.   Has multiple dental caries treated along with several extractions at [facility] 08/02.   He was seen in clinic by Dr. [name] and appeared volume overloaded. Short of breath with 10# lb weight gain. Had restarted metolazone on his own a week prior.    Admission recommended for diuresis and possible inotropic support. Sent to ED for evaluation. He was given 40 mg lasix IV X2 and admitted to cardiology service for management of acute on chronic CHF. Midodrine held since blood pressure okay. Remained volume up and subsequently started on 80 mg lasix IV BID. AHF consulted for further management. Echo with EF 45-50%, multiple WMA, RV okay, PASP 55 mmHg, LA severely dilated, mild TR, trial MR. Concern for cardiac amyloid with LVH and he underwent PYP scan which was not suggestive of TTR cardiac  amyloidosis. He underwent RHC showing elevated right and left filling pressures; RA mean 13, RV 53/15, PA 63/23, mean 39, PCWP mean 20, CO/CI 6.3/3.07, PVR 3 WU. Metolazone given and he was eventually transitioned to po torsemide.  Today he returns for post hospitalization HF follow up. He feels a little more SOB for past couple of days. He is dyspneic with increased physical activity on flat ground at baseline. Some swelling in abdomen and dizziness with position changes.  He has been drinking a lot of fluid and eats most of his meals out, enjoys [employer]. Denies CP, edema, or PND/Orthopnea. Appetite ok. Weight at home 190 pounds. Taking all medications.   Review of Systems: [y] = yes, [ ]  = no   General: Weight gain [ ] ; Weight loss [ ] ; Anorexia [ ] ; Fatigue [y]; Fever [ ] ; Chills [ ] ; Weakness [ ]   Cardiac: Chest pain/pressure [ ] ; Resting SOB [ ] ; Exertional SOB Blue.Reese ]; Orthopnea [ ] ; Pedal Edema [ ] ; Palpitations [ ] ; Syncope [ ] ; Presyncope [ ] ; Paroxysmal nocturnal dyspnea[ ]   Pulmonary: Cough [ ] ; Wheezing[ ] ; Hemoptysis[ ] ; Sputum [ ] ; Snoring [ ]   GI: Vomiting[ ] ; Dysphagia[ ] ; Melena[ ] ; Hematochezia [ ] ; Heartburn[ ] ; Abdominal pain [ ] ; Constipation [ ] ; Diarrhea [ ] ; BRBPR [ ]   GU: Hematuria[ ] ; Dysuria [ ] ; Nocturia[ ]   Vascular: Pain in legs with walking [ ] ; Pain in feet with lying flat [ ] ; Non-healing sores [ ] ;  Stroke [ ] ; TIA [ ] ; Slurred speech [ ] ;  Neuro: Headaches[ ] ; Vertigo[ ] ; Seizures[ ] ; Paresthesias[ ] ;Blurred vision [ ] ; Diplopia [ ] ; Vision changes [ ]   Ortho/Skin: Arthritis [ ] ; Joint pain [ ] ; Muscle pain [ ] ; Joint swelling [ ] ; Back Pain [ ] ; Rash [ ]   Psych: Depression[ ] ; Anxiety[ ]   Heme: Bleeding problems [ ] ; Clotting disorders [ ] ; Anemia [ ]   Endocrine: Diabetes [ ] ; Thyroid dysfunction[ ]   Past Medical History:  Diagnosis Date   Atrial fibrillation ([place]) 08/2009   CAD (coronary artery disease)    s/p CABG -- 1992   ESRD (end stage renal disease) ([place])     HLD (hyperlipidemia)    HTN (hypertension)    Idiopathic thrombocytopenia (HCC)    Current Outpatient Medications  Medication Sig Dispense Refill   allopurinol (ZYLOPRIM) 100 MG tablet Take 100 mg by mouth daily.     amiodarone (PACERONE) 200 MG tablet TAKE 1 TABLET BY MOUTH EVERY DAY 30 tablet 5   atorvastatin (LIPITOR) 20 MG tablet Take 20 mg by mouth at bedtime.      calcitRIOL (ROCALTROL) 0.25 MCG capsule Take 0.25 mcg by mouth daily.     Cholecalciferol (VITAMIN D3) 25 MCG (1000 UT) CAPS Take 1,000 Units by mouth daily.      Cyanocobalamin (VITAMIN B12) 500 MCG TABS Take 500 mcg by mouth daily.      dapagliflozin propanediol (FARXIGA) 10 MG TABS tablet Take 1 tablet (10 mg total) by mouth daily. 30 tablet 6   DULoxetine (CYMBALTA) 60 MG capsule Take 60 mg by mouth at bedtime.     EPINEPHrine (EPI-PEN) 0.3 mg/0.3 mL DEVI Inject 0.3 mg into the muscle once as needed (severe allergic reaction).     ferrous sulfate 325 (65 FE) MG tablet Take 325 mg by mouth daily with breakfast.     fish oil-omega-3 fatty acids 1000 MG capsule Take 2 g by mouth daily.     fluticasone (FLONASE) 50 MCG/ACT nasal spray Place 2 sprays into both nostrils daily as needed for allergies or rhinitis.      MAGNESIUM-OXIDE 400 (240 Mg) MG tablet Take 400 mg by mouth 2 (two) times daily.     melatonin 5 MG TABS Take 5 mg by mouth at bedtime.     nitroGLYCERIN (NITROSTAT) 0.4 MG SL tablet Place 0.4 mg under the tongue every 5 (five) minutes as needed for chest pain.     potassium chloride SA (KLOR-CON) 20 MEQ tablet Take 2 tablets (40 mEq total) by mouth daily. 40 tablet 0   predniSONE (DELTASONE) 5 MG tablet Take 1 tablet (5 mg total) by mouth daily.     QUEtiapine (SEROQUEL) 50 MG tablet Take 150 mg by mouth at bedtime.     tacrolimus (PROGRAF) 0.5 MG capsule Take 0.5 mg by mouth See admin instructions. Takes 1 tablet by mouth one day and 2 tablets next day (1 and 2 tablets on alternate days)     torsemide 60 MG TABS  Take 60 mg by mouth daily. 30 tablet 0   traZODone (DESYREL) 150 MG tablet Take 150 mg by mouth at bedtime as needed for sleep.      warfarin (COUMADIN) 4 MG tablet TAKE AS DIRECTED BY COUMADIN CLINIC (Patient taking differently: Take 2-4 mg by mouth See admin instructions. Takes 4 mg on Tuesdays and 2 mg on all other days) 65 tablet 1   No current facility-administered medications for this encounter.   Allergies  Allergen Reactions   Other Anaphylaxis    Spider venom   Grapefruit Concentrate Other (See Comments)    Pt has been told not to eat grapefruit   Haloperidol Other (See Comments)    Caused the patient to be overly-sedated   Nsaids Other (See Comments)    Cannot take due to liver transplant   Rifampin Other (See Comments)    Caused flushing   Triazolam Other (See Comments)    Caused the patient to be overly-sedated   Social History   Socioeconomic History   Marital status: Single    Spouse name: Not on file   Number of children: 0   Years of education: Not on file   Highest education level: Not on file  Occupational History   Occupation: retired  Tobacco Use   Smoking status: Never   Smokeless tobacco: Never  Substance and Sexual Activity   Alcohol use: No   Drug use: No   Sexual activity: Not on file  Other Topics Concern   Not on file  Social History Narrative   Not on file   Social Determinants of Health   Financial Resource Strain: Low Risk    Difficulty of Paying Living Expenses: Not very hard  Food Insecurity: No Food Insecurity   Worried About Charity fundraiser in the Last Year: Never true   Ran Out of Food in the Last Year: Never true  Transportation Needs: No Transportation Needs   Lack of Transportation (Medical): No   Lack of Transportation (Non-Medical): No  Physical Activity: Not on file  Stress: Not on file  Social Connections: Not on file  Intimate Partner Violence: Not on file   Family History  Problem Relation Age of Onset    Atrial fibrillation Mother    Breast cancer Mother    Renal cancer Father    Hypertension Brother    Hyperlipidemia Brother    Other Brother        stent    Renal Disease Maternal Grandmother    Stroke Maternal Grandmother    Heart attack Maternal Grandfather    Cancer Paternal Grandmother    Heart failure Paternal Grandfather    Emphysema Paternal Grandfather    Bronchiolitis Paternal Grandfather    BP 100/74   Pulse 58   Wt 87.5 kg (193 lb)   SpO2 97%   BMI 27.69 kg/m   Wt Readings from Last 3 Encounters:  07/16/21 87.5 kg (193 lb)  06/27/21 85 kg (187 lb 8 oz)  06/23/21 90.4 kg (199 lb 3.2 oz)   PHYSICAL EXAM: General:  NAD. No resp difficulty HEENT: Normal Neck: Supple. Thick neck, JVP~6-7. Carotids 2+ bilat; no bruits. No lymphadenopathy or thryomegaly appreciated. Cor: PMI nondisplaced. Irregular rate & rhythm. No rubs, gallops or murmurs. Lungs: Clear Abdomen: Obese, nontender, +distended. No hepatosplenomegaly. No bruits or masses. Good bowel sounds. Extremities: No cyanosis, clubbing, rash, edema Neuro: Alert & oriented x 3, cranial nerves grossly intact. Moves all 4 extremities w/o difficulty. Affect pleasant.  ECG: atrial fibrillation 76 bpm (personally reviewed).  ReDs: 54%  ASSESSMENT & PLAN: 1. Chronic HF with mid range EF: Ischemic cardiomyopathy.  Echo this admission with EF 45-50%, moderate LVH, RV normal, PASP 55 mmHg. Recent admission in 6/22 with CHF.  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  Also concern for possible cardiac amyloidosis with LVH.  RHC with elevated right and left heart filling pressures. PYP scan not suggestive  of TTR cardiac amyloidosis. 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 NYHA II-early III, he appears at least mildly volume  overloaded on exam, ReDs 54%, likely due to diet and fluid indiscretion. - Increase torsemide to 60 mg bid x 2 days then back to 60 mg daily. BMET/BNP today, repeat BMET in 10 days. - Continue Farxiga 10 mg daily.   - Will work on setting up [place] placement for home volume monitoring. We discussed this today and will start insurance process. 2. CKD: Stage 3b. 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  BMET today. - Follows with Dr. Moshe Cipro. - On tacrolimus and prednisone.  3. VT: Patient has history of VT, maintained on amiodarone.  4. Atrial fibrillation: Permanent x years.  Rate controlled. Unlikely to be able to regain NSR for any significant period of time, would not recommend ablation.  - Continue warfarin.  INR followed by Coumadin Clinic. - He has follow up at [facility] for a second opinion for ? ablation. 5. CAD: s/p CABG 1992.  Last LHC in 2019 in setting of VT with patent LIMA to LAD, patent SVG to PDA, moderate stenosis SVG to ramus, occluded SVG to OM1 (medical therapy suggested d/t comorbidities and thrombocytopenia with need for anticoagulation).  No chest pain.   - Continue statin.  - On warfarin so ASA not needed.  6. S/p liver transplant: Followed at [facility].  7. Nocturnal hypoxemia: O2 desaturations noted while sleeping inpatient -? Sleep apnea as above. Discussed sleep study, he suffers with insomnia and would like to defer for now.   Follow up in 3 weeks with APP to reassess volume and in 6-8 weeks with Dr. Loistine Simas, FNP 07/16/21

## 2021-07-15 NOTE — Progress Notes (Addendum)
[name], MD Heme/Onc: Dr. Hosie Poisson HF Cardiologist: Dr. Aundra Dubin  HPI: [name] 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 [name], depression, chronic ITP. Has been on midodrine in past for hypotension, however; does not recall ever taking the medicine.    He has been a DNR. Reports poor quality of life for several years. Weak and dyspnea with minimal activity.  Had multiple admissions for acute on chronic CHF several years ago but none in the last 2 years until recently.   Patient admitted to [place] at end of June with acute on chronic CHF. Diuresed with IV lasix. Sent home on prior home dose furosemide at 40 mg BID.  Metolazone was stopped.  Echo with EF 45%. Coreg held d/t hypotension and started on Toprol xl at D/C. Not on SGLT2i d/t cost. Initiated on losartan.    Seen in ED 07/21 with lightheadedness and hypotension. No orthostasis. Losartan and metoprolol held.   Has multiple dental caries treated along with several extractions at [facility] 08/02.   He was seen in clinic by Dr. [name] and appeared volume overloaded. Short of breath with 10# lb weight gain. Had restarted metolazone on his own a week prior.    Admission recommended for diuresis and possible inotropic support. Sent to ED for evaluation. He was given 40 mg lasix IV X2 and admitted to cardiology service for management of acute on chronic CHF. Midodrine held since blood pressure okay. Remained volume up and subsequently started on 80 mg lasix IV BID. AHF consulted for further management. Echo with EF 45-50%, multiple WMA, RV okay, PASP 55 mmHg, LA severely dilated, mild TR, trial MR. Concern for cardiac amyloid with LVH and he underwent PYP scan which was not suggestive of TTR cardiac  amyloidosis. He underwent RHC showing elevated right and left filling pressures; RA mean 13, RV 53/15, PA 63/23, mean 39, PCWP mean 20, CO/CI 6.3/3.07, PVR 3 WU. Metolazone given and he was eventually transitioned to po torsemide.  Today he returns for post hospitalization HF follow up. He feels a little more SOB for past couple of days. He is dyspneic with increased physical activity on flat ground at baseline. Some swelling in abdomen and dizziness with position changes.  He has been drinking a lot of fluid and eats most of his meals out, enjoys [employer]. Denies CP, edema, or PND/Orthopnea. Appetite ok. Weight at home 190 pounds. Taking all medications.   Review of Systems: [y] = yes, [ ]  = no   General: Weight gain [ ] ; Weight loss [ ] ; Anorexia [ ] ; Fatigue [y]; Fever [ ] ; Chills [ ] ; Weakness [ ]   Cardiac: Chest pain/pressure [ ] ; Resting SOB [ ] ; Exertional SOB Blue.Reese ]; Orthopnea [ ] ; Pedal Edema [ ] ; Palpitations [ ] ; Syncope [ ] ; Presyncope [ ] ; Paroxysmal nocturnal dyspnea[ ]   Pulmonary: Cough [ ] ; Wheezing[ ] ; Hemoptysis[ ] ; Sputum [ ] ; Snoring [ ]   GI: Vomiting[ ] ; Dysphagia[ ] ; Melena[ ] ; Hematochezia [ ] ; Heartburn[ ] ; Abdominal pain [ ] ; Constipation [ ] ; Diarrhea [ ] ; BRBPR [ ]   GU: Hematuria[ ] ; Dysuria [ ] ; Nocturia[ ]   Vascular: Pain in legs with walking [ ] ; Pain in feet with lying flat [ ] ; Non-healing sores [ ] ;  Stroke [ ] ; TIA [ ] ; Slurred speech [ ] ;  Neuro: Headaches[ ] ; Vertigo[ ] ; Seizures[ ] ; Paresthesias[ ] ;Blurred vision [ ] ; Diplopia [ ] ; Vision changes [ ]   Ortho/Skin: Arthritis [ ] ; Joint pain [ ] ; Muscle pain [ ] ; Joint swelling [ ] ; Back Pain [ ] ; Rash [ ]   Psych: Depression[ ] ; Anxiety[ ]   Heme: Bleeding problems [ ] ; Clotting disorders [ ] ; Anemia [ ]   Endocrine: Diabetes [ ] ; Thyroid dysfunction[ ]   Past Medical History:  Diagnosis Date   Atrial fibrillation ([place]) 08/2009   CAD (coronary artery disease)    s/p CABG -- 1992   ESRD (end stage renal disease) ([place])     HLD (hyperlipidemia)    HTN (hypertension)    Idiopathic thrombocytopenia (HCC)    Current Outpatient Medications  Medication Sig Dispense Refill   allopurinol (ZYLOPRIM) 100 MG tablet Take 100 mg by mouth daily.     amiodarone (PACERONE) 200 MG tablet TAKE 1 TABLET BY MOUTH EVERY DAY 30 tablet 5   atorvastatin (LIPITOR) 20 MG tablet Take 20 mg by mouth at bedtime.      calcitRIOL (ROCALTROL) 0.25 MCG capsule Take 0.25 mcg by mouth daily.     Cholecalciferol (VITAMIN D3) 25 MCG (1000 UT) CAPS Take 1,000 Units by mouth daily.      Cyanocobalamin (VITAMIN B12) 500 MCG TABS Take 500 mcg by mouth daily.      dapagliflozin propanediol (FARXIGA) 10 MG TABS tablet Take 1 tablet (10 mg total) by mouth daily. 30 tablet 6   DULoxetine (CYMBALTA) 60 MG capsule Take 60 mg by mouth at bedtime.     EPINEPHrine (EPI-PEN) 0.3 mg/0.3 mL DEVI Inject 0.3 mg into the muscle once as needed (severe allergic reaction).     ferrous sulfate 325 (65 FE) MG tablet Take 325 mg by mouth daily with breakfast.     fish oil-omega-3 fatty acids 1000 MG capsule Take 2 g by mouth daily.     fluticasone (FLONASE) 50 MCG/ACT nasal spray Place 2 sprays into both nostrils daily as needed for allergies or rhinitis.      MAGNESIUM-OXIDE 400 (240 Mg) MG tablet Take 400 mg by mouth 2 (two) times daily.     melatonin 5 MG TABS Take 5 mg by mouth at bedtime.     nitroGLYCERIN (NITROSTAT) 0.4 MG SL tablet Place 0.4 mg under the tongue every 5 (five) minutes as needed for chest pain.     potassium chloride SA (KLOR-CON) 20 MEQ tablet Take 2 tablets (40 mEq total) by mouth daily. 40 tablet 0   predniSONE (DELTASONE) 5 MG tablet Take 1 tablet (5 mg total) by mouth daily.     QUEtiapine (SEROQUEL) 50 MG tablet Take 150 mg by mouth at bedtime.     tacrolimus (PROGRAF) 0.5 MG capsule Take 0.5 mg by mouth See admin instructions. Takes 1 tablet by mouth one day and 2 tablets next day (1 and 2 tablets on alternate days)     torsemide 60 MG TABS  Take 60 mg by mouth daily. 30 tablet 0   traZODone (DESYREL) 150 MG tablet Take 150 mg by mouth at bedtime as needed for sleep.      warfarin (COUMADIN) 4 MG tablet TAKE AS DIRECTED BY COUMADIN CLINIC (Patient taking differently: Take 2-4 mg by mouth See admin instructions. Takes 4 mg on Tuesdays and 2 mg on all other days) 65 tablet 1   No current facility-administered medications for this encounter.   Allergies  Allergen Reactions   Other Anaphylaxis    Spider venom   Grapefruit Concentrate Other (See Comments)    Pt has been told not to eat grapefruit   Haloperidol Other (See Comments)    Caused the patient to be overly-sedated   Nsaids Other (See Comments)    Cannot take due to liver transplant   Rifampin Other (See Comments)    Caused flushing   Triazolam Other (See Comments)    Caused the patient to be overly-sedated   Social History   Socioeconomic History   Marital status: Single    Spouse name: Not on file   Number of children: 0   Years of education: Not on file   Highest education level: Not on file  Occupational History   Occupation: retired  Tobacco Use   Smoking status: Never   Smokeless tobacco: Never  Substance and Sexual Activity   Alcohol use: No   Drug use: No   Sexual activity: Not on file  Other Topics Concern   Not on file  Social History Narrative   Not on file   Social Determinants of Health   Financial Resource Strain: Low Risk    Difficulty of Paying Living Expenses: Not very hard  Food Insecurity: No Food Insecurity   Worried About Charity fundraiser in the Last Year: Never true   Ran Out of Food in the Last Year: Never true  Transportation Needs: No Transportation Needs   Lack of Transportation (Medical): No   Lack of Transportation (Non-Medical): No  Physical Activity: Not on file  Stress: Not on file  Social Connections: Not on file  Intimate Partner Violence: Not on file   Family History  Problem Relation Age of Onset    Atrial fibrillation Mother    Breast cancer Mother    Renal cancer Father    Hypertension Brother    Hyperlipidemia Brother    Other Brother        stent    Renal Disease Maternal Grandmother    Stroke Maternal Grandmother    Heart attack Maternal Grandfather    Cancer Paternal Grandmother    Heart failure Paternal Grandfather    Emphysema Paternal Grandfather    Bronchiolitis Paternal Grandfather    BP 100/74   Pulse 97   Wt 87.5 kg (193 lb)   SpO2 97%   BMI 27.69 kg/m   Wt Readings from Last 3 Encounters:  07/16/21 87.5 kg (193 lb)  06/27/21 85 kg (187 lb 8 oz)  06/23/21 90.4 kg (199 lb 3.2 oz)   PHYSICAL EXAM: General:  NAD. No resp difficulty HEENT: Normal Neck: Supple. Thick neck, JVP~6-7. Carotids 2+ bilat; no bruits. No lymphadenopathy or thryomegaly appreciated. Cor: PMI nondisplaced. Irregular rate & rhythm. No rubs, gallops or murmurs. Lungs: Clear Abdomen: Obese, nontender, +distended. No hepatosplenomegaly. No bruits or masses. Good bowel sounds. Extremities: No cyanosis, clubbing, rash, edema Neuro: Alert & oriented x 3, cranial nerves grossly intact. Moves all 4 extremities w/o difficulty. Affect pleasant.  ECG: atrial fibrillation 76 bpm (personally reviewed).  ReDs: 54%  ASSESSMENT & PLAN: 1. Chronic HF with mid range EF: Ischemic cardiomyopathy.  Echo this admission with EF 45-50%, moderate LVH, RV normal, PASP 55 mmHg. Recent admission in 6/22 with CHF.  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  Also concern for possible cardiac amyloidosis with LVH.  RHC with elevated right and left heart filling pressures. PYP scan not suggestive  of TTR cardiac amyloidosis. 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 NYHA II-early III, he appears at least mildly volume  overloaded on exam, ReDs 54%, likely due to diet and fluid indiscretion. - Increase torsemide to 60 mg bid x 2 days then back to 60 mg daily. BMET/BNP today, repeat BMET in 10 days. - Continue Farxiga 10 mg daily.   - Will work on setting up [place] placement for home volume monitoring. We discussed this today and will start insurance process. 2. CKD: Stage 3b. 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  BMET today. - Follows with Dr. Moshe Cipro. - On tacrolimus and prednisone.  3. VT: Patient has history of VT, maintained on amiodarone.  4. Atrial fibrillation: Permanent x years.  Rate controlled. Unlikely to be able to regain NSR for any significant period of time, would not recommend ablation.  - Continue warfarin.  INR followed by Coumadin Clinic. - He has follow up at [facility] for a second opinion for ? ablation. 5. CAD: s/p CABG 1992.  Last LHC in 2019 in setting of VT with patent LIMA to LAD, patent SVG to PDA, moderate stenosis SVG to ramus, occluded SVG to OM1 (medical therapy suggested d/t comorbidities and thrombocytopenia with need for anticoagulation).  No chest pain.   - Continue statin.  - On warfarin so ASA not needed.  6. S/p liver transplant: Followed at [facility].  7. Nocturnal hypoxemia: O2 desaturations noted while sleeping inpatient -? Sleep apnea as above. Discussed sleep study, he suffers with insomnia and would like to defer for now.   Follow up in 3 weeks with APP to reassess volume and in 6-8 weeks with Dr. Loistine Simas, FNP 07/16/21

## 2021-07-16 ENCOUNTER — Ambulatory Visit (HOSPITAL_COMMUNITY)
Admission: RE | Admit: 2021-07-16 | Discharge: 2021-07-16 | Disposition: A | Discharge status: Home / Self Care | Payer: Medicare Other | Source: Ambulatory Visit | Attending: Family Medicine | Admitting: Family Medicine

## 2021-07-16 ENCOUNTER — Other Ambulatory Visit: Payer: Self-pay

## 2021-07-16 ENCOUNTER — Encounter (HOSPITAL_COMMUNITY): Payer: Self-pay

## 2021-07-16 VITALS — BP 100/74 | HR 77 | Wt 193.0 lb

## 2021-07-16 DIAGNOSIS — I959 Hypotension, unspecified: Secondary | ICD-10-CM | POA: Diagnosis not present

## 2021-07-16 DIAGNOSIS — Z7984 Long term (current) use of oral hypoglycemic drugs: Secondary | ICD-10-CM | POA: Insufficient documentation

## 2021-07-16 DIAGNOSIS — Z66 Do not resuscitate: Secondary | ICD-10-CM | POA: Diagnosis not present

## 2021-07-16 DIAGNOSIS — Z951 Presence of aortocoronary bypass graft: Secondary | ICD-10-CM | POA: Diagnosis not present

## 2021-07-16 DIAGNOSIS — Z8249 Family history of ischemic heart disease and other diseases of the circulatory system: Secondary | ICD-10-CM | POA: Insufficient documentation

## 2021-07-16 DIAGNOSIS — I472 Ventricular tachycardia, unspecified: Secondary | ICD-10-CM

## 2021-07-16 DIAGNOSIS — N183 Chronic kidney disease, stage 3 unspecified: Secondary | ICD-10-CM

## 2021-07-16 DIAGNOSIS — Z79899 Other long term (current) drug therapy: Secondary | ICD-10-CM | POA: Insufficient documentation

## 2021-07-16 DIAGNOSIS — I5022 Chronic systolic (congestive) heart failure: Secondary | ICD-10-CM

## 2021-07-16 DIAGNOSIS — Z7901 Long term (current) use of anticoagulants: Secondary | ICD-10-CM | POA: Diagnosis not present

## 2021-07-16 DIAGNOSIS — Z944 Liver transplant status: Secondary | ICD-10-CM | POA: Diagnosis not present

## 2021-07-16 DIAGNOSIS — I4821 Permanent atrial fibrillation: Secondary | ICD-10-CM

## 2021-07-16 DIAGNOSIS — D472 Monoclonal gammopathy: Secondary | ICD-10-CM | POA: Diagnosis not present

## 2021-07-16 DIAGNOSIS — R0902 Hypoxemia: Secondary | ICD-10-CM | POA: Insufficient documentation

## 2021-07-16 DIAGNOSIS — Z940 Kidney transplant status: Secondary | ICD-10-CM | POA: Insufficient documentation

## 2021-07-16 DIAGNOSIS — G4700 Insomnia, unspecified: Secondary | ICD-10-CM | POA: Diagnosis not present

## 2021-07-16 DIAGNOSIS — I5042 Chronic combined systolic (congestive) and diastolic (congestive) heart failure: Secondary | ICD-10-CM

## 2021-07-16 DIAGNOSIS — I25810 Atherosclerosis of coronary artery bypass graft(s) without angina pectoris: Secondary | ICD-10-CM

## 2021-07-16 DIAGNOSIS — Z7952 Long term (current) use of systemic steroids: Secondary | ICD-10-CM | POA: Diagnosis not present

## 2021-07-16 DIAGNOSIS — I132 Hypertensive heart and chronic kidney disease with heart failure and with stage 5 chronic kidney disease, or end stage renal disease: Secondary | ICD-10-CM | POA: Insufficient documentation

## 2021-07-16 DIAGNOSIS — I255 Ischemic cardiomyopathy: Secondary | ICD-10-CM | POA: Diagnosis not present

## 2021-07-16 DIAGNOSIS — D693 Immune thrombocytopenic purpura: Secondary | ICD-10-CM | POA: Insufficient documentation

## 2021-07-16 DIAGNOSIS — G4734 Idiopathic sleep related nonobstructive alveolar hypoventilation: Secondary | ICD-10-CM

## 2021-07-16 DIAGNOSIS — I252 Old myocardial infarction: Secondary | ICD-10-CM | POA: Diagnosis not present

## 2021-07-16 DIAGNOSIS — Z886 Allergy status to analgesic agent status: Secondary | ICD-10-CM | POA: Insufficient documentation

## 2021-07-16 LAB — CBC
HCT: 45.2 % (ref 39.0–52.0)
Hemoglobin: 13.6 g/dL (ref 13.0–17.0)
MCH: 27.6 pg (ref 26.0–34.0)
MCHC: 30.1 g/dL (ref 30.0–36.0)
MCV: 91.9 fL (ref 80.0–100.0)
Platelets: 75 10*3/uL — ABNORMAL LOW (ref 150–400)
RBC: 4.92 MIL/uL (ref 4.22–5.81)
RDW: 16.3 % — ABNORMAL HIGH (ref 11.5–15.5)
WBC: 4.1 10*3/uL (ref 4.0–10.5)
nRBC: 0 % (ref 0.0–0.2)

## 2021-07-16 LAB — BASIC METABOLIC PANEL
Anion gap: 7 (ref 5–15)
BUN: 16 mg/dL (ref 8–23)
CO2: 29 mmol/L (ref 22–32)
Calcium: 9.2 mg/dL (ref 8.9–10.3)
Chloride: 102 mmol/L (ref 98–111)
Creatinine, Ser: 2.05 mg/dL — ABNORMAL HIGH (ref 0.61–1.24)
GFR, Estimated: 35 mL/min — ABNORMAL LOW (ref >60)
Glucose, Bld: 130 mg/dL — ABNORMAL HIGH (ref 70–99)
Potassium: 4.6 mmol/L (ref 3.5–5.1)
Sodium: 138 mmol/L (ref 135–145)

## 2021-07-16 LAB — BRAIN NATRIURETIC PEPTIDE: B Natriuretic Peptide: 208.5 pg/mL — ABNORMAL HIGH (ref 0.0–100.0)

## 2021-07-16 NOTE — Progress Notes (Signed)
ReDS Vest / Clip - 07/16/21 1600       ReDS Vest / Clip   Station Marker C    Ruler Value 27    ReDS Value Range High volume overload    ReDS Actual Value 54

## 2021-07-16 NOTE — Patient Instructions (Signed)
INCREASE Torsemide to 60 mg twice a day for 3 days, then resume normal dose of 60 mg daily  Labs today We will only contact you if something comes back abnormal or we need to make some changes. Otherwise no news is good news!  Labs needed in 7-10 days  The [facility] consists of a small pressure-sensing device that is implanted directly into your pulmonary artery. Once implanted, the sensor measures and transmits your blood flow pressure and heart rate. -once approved with by your insurance, we will be in contact to arrange implant. Approval times vary by insurance.  Your physician recommends that you schedule a follow-up appointment in: 3 week  in the Advanced Practitioners (PA/NP) Clinic and in 6-8 weeks with Dr Aundra Dubin  Do the following things EVERYDAY: Weigh yourself in the morning before breakfast. Write it down and keep it in a log. Take your medicines as prescribed Eat low salt foods--Limit salt (sodium) to 2000 mg per day.  Stay as active as you can everyday Limit all fluids for the day to less than 2 liters  At the [facility], you and your health needs are our priority. As part of our continuing mission to provide you with exceptional heart care, we have created designated Provider Care Teams. These Care Teams include your primary Cardiologist (physician) and Advanced Practice Providers (APPs- Physician Assistants and Nurse Practitioners) who all work together to provide you with the care you need, when you need it.   You may see any of the following providers on your designated Care Team at your next follow up: Dr Glori Bickers Dr Loralie Champagne Dr Patrice Paradise, NP Lyda Jester, [place] [name] Audry Riles, PharmD   Please be sure to bring in all your medications bottles to every appointment.   If you have any questions or concerns before your next appointment please send [place] a message through [place] or call our office at  [phone].    TO LEAVE A MESSAGE FOR THE NURSE SELECT OPTION 2, PLEASE LEAVE A MESSAGE INCLUDING: YOUR NAME DATE OF BIRTH CALL BACK NUMBER REASON FOR CALL**this is important as we prioritize the call backs  YOU WILL RECEIVE A CALL BACK THE SAME DAY AS LONG AS YOU CALL BEFORE 4:00 PM

## 2021-07-17 ENCOUNTER — Other Ambulatory Visit: Payer: Self-pay | Admitting: Oncology

## 2021-07-17 ENCOUNTER — Inpatient Hospital Stay: Payer: Medicare Other | Attending: Oncology

## 2021-07-17 ENCOUNTER — Encounter: Payer: Self-pay | Admitting: Oncology

## 2021-07-17 ENCOUNTER — Ambulatory Visit (INDEPENDENT_AMBULATORY_CARE_PROVIDER_SITE_OTHER): Payer: Medicare Other | Admitting: *Deleted

## 2021-07-17 ENCOUNTER — Other Ambulatory Visit: Payer: Self-pay | Admitting: Hematology and Oncology

## 2021-07-17 ENCOUNTER — Inpatient Hospital Stay: Payer: Medicare Other | Attending: Oncology | Admitting: Oncology

## 2021-07-17 VITALS — BP 132/60 | HR 77 | Temp 98.4°F | Resp 18 | Ht 70.0 in | Wt 191.9 lb

## 2021-07-17 DIAGNOSIS — D693 Immune thrombocytopenic purpura: Secondary | ICD-10-CM | POA: Diagnosis not present

## 2021-07-17 DIAGNOSIS — M8580 Other specified disorders of bone density and structure, unspecified site: Secondary | ICD-10-CM

## 2021-07-17 DIAGNOSIS — D649 Anemia, unspecified: Secondary | ICD-10-CM | POA: Diagnosis not present

## 2021-07-17 DIAGNOSIS — D472 Monoclonal gammopathy: Secondary | ICD-10-CM | POA: Diagnosis present

## 2021-07-17 DIAGNOSIS — D519 Vitamin B12 deficiency anemia, unspecified: Secondary | ICD-10-CM | POA: Diagnosis not present

## 2021-07-17 DIAGNOSIS — Z7901 Long term (current) use of anticoagulants: Secondary | ICD-10-CM

## 2021-07-17 DIAGNOSIS — Z7189 Other specified counseling: Secondary | ICD-10-CM

## 2021-07-17 LAB — BASIC METABOLIC PANEL
BUN: 19 (ref 4–21)
CO2: 27 — AB (ref 13–22)
Chloride: 105 (ref 99–108)
Creatinine: 2 — AB (ref 0.6–1.3)
Glucose: 137
Potassium: 4.1 (ref 3.4–5.3)
Sodium: 141 (ref 137–147)

## 2021-07-17 LAB — HEPATIC FUNCTION PANEL
ALT: 14 (ref 10–40)
AST: 24 (ref 14–40)
Alkaline Phosphatase: 70 (ref 25–125)
Bilirubin, Total: 1.8

## 2021-07-17 LAB — POCT INR: INR: 3.3 — AB (ref 2.0–3.0)

## 2021-07-17 LAB — COMPREHENSIVE METABOLIC PANEL
Albumin: 3.7 (ref 3.5–5.0)
Calcium: 9 (ref 8.7–10.7)

## 2021-07-17 NOTE — Patient Instructions (Signed)
Description   Spoke with patient and instructed pt to hold today's dose then resume taking Warfarin 2mg  daily except 4mg  on Tuesdays. Remain consistent with your green leafy vegetables. Recheck INR in 1 week, self tester for insurance purposes. [phone]

## 2021-07-18 LAB — IGG, IGA, IGM
IgA: 160 mg/dL (ref 61–437)
IgG (Immunoglobin G), Serum: 1215 mg/dL (ref 603–1613)
IgM (Immunoglobulin M), Srm: 86 mg/dL (ref 20–172)

## 2021-07-20 LAB — KAPPA/LAMBDA LIGHT CHAINS
Kappa free light chain: 130.6 mg/L — ABNORMAL HIGH (ref 3.3–19.4)
Kappa, lambda light chain ratio: 4.07 — ABNORMAL HIGH (ref 0.26–1.65)
Lambda free light chains: 32.1 mg/L — ABNORMAL HIGH (ref 5.7–26.3)

## 2021-07-21 LAB — PROTEIN ELECTROPHORESIS, SERUM
A/G Ratio: 1.3 (ref 0.7–1.7)
Albumin ELP: 3.5 g/dL (ref 2.9–4.4)
Alpha-1-Globulin: 0.2 g/dL (ref 0.0–0.4)
Alpha-2-Globulin: 0.6 g/dL (ref 0.4–1.0)
Beta Globulin: 0.7 g/dL (ref 0.7–1.3)
Gamma Globulin: 1.2 g/dL (ref 0.4–1.8)
Globulin, Total: 2.8 g/dL (ref 2.2–3.9)
M-Spike, %: 0.7 g/dL — ABNORMAL HIGH
Total Protein ELP: 6.3 g/dL (ref 6.0–8.5)

## 2021-07-23 ENCOUNTER — Telehealth: Payer: Self-pay

## 2021-07-23 NOTE — Telephone Encounter (Signed)
-----   Message from Belva Chimes, LPN sent at 12/16/221  3:44 PM EDT ----- Regarding: FW: call pt  ----- Message ----- From: Derwood Kaplan, MD Sent: 07/22/2021   7:36 PM EDT To: Belva Chimes, LPN Subject: call pt                                        Can tell him M spike is exactly the same, stable.

## 2021-07-23 NOTE — Telephone Encounter (Signed)
Levada Dy notified patient.

## 2021-07-24 ENCOUNTER — Ambulatory Visit (INDEPENDENT_AMBULATORY_CARE_PROVIDER_SITE_OTHER): Payer: Medicare Other

## 2021-07-24 DIAGNOSIS — Z5181 Encounter for therapeutic drug level monitoring: Secondary | ICD-10-CM | POA: Diagnosis not present

## 2021-07-24 LAB — POCT INR: INR: 3 (ref 2.0–3.0)

## 2021-07-24 NOTE — Patient Instructions (Signed)
Description   Spoke with patient and instructed pt to hold today's dose then resume taking Warfarin 2mg  daily except 4mg  on Tuesdays. Remain consistent with your green leafy vegetables. Recheck INR in 1 week, self tester for insurance purposes. [phone]

## 2021-07-28 ENCOUNTER — Telehealth (HOSPITAL_COMMUNITY): Payer: Self-pay | Admitting: Cardiology

## 2021-07-28 NOTE — Telephone Encounter (Signed)
   You are scheduled for a Cardiac Catheterization/CardioMems Implant on Tuesday, September 27 with Dr. Loralie Champagne.  1. Please arrive at the [facility] (Main Entrance A) at [facility]: [address] [place], [place] [address] at 10:30 AM (This time is two hours before your procedure to ensure your preparation). Free valet parking service is available.   Special note: Every effort is made to have your procedure done on time. Please understand that emergencies sometimes delay scheduled procedures.  2. Diet: Do not eat solid foods after midnight.  The patient may have clear liquids until 5am upon the day of the procedure.  3. Labs: pre procedure labs are not required as blood work was done 07/17/2021  4. Medication instructions in preparation for your procedure:   Contrast Allergy: No    Stop taking Coumadin (Warfarin) on Thursday, September 22.  Hold Torsemide on the morning of your procedure. Tuesday September 27.     On the morning of your procedure, take your Aspirin and any morning medicines NOT listed above.  You may use sips of water.  5. Plan for one night stay--bring personal belongings. 6. Bring a current list of your medications and current insurance cards. 7. You MUST have a responsible person to drive you home. 8. Someone MUST be with you the first 24 hours after you arrive home or your discharge will be delayed. 9. Please wear clothes that are easy to get on and off and wear slip-on shoes. -A partner in care, someone in your support network, to:  Participate in the education session and assist where needed with home care  Drive you home      During your stay  The right heart catheterization  will take approximately 1 hour  You will receive patient education and necessary resources for transmissions (special pillow and antennae) prior to leaving  Once you are at home  Lay on the pillow and send a transmission every morning Make sure to take your  Plavix 75 mg daily for one month and Aspirin __81___mg daily for life Our office will call you weekly for the first 3 weeks You will be seen for a follow up appointment on as scheduled with the chf clinic 08/04/2021 You will receive periodic phone calls either from our office or automated system You will receive a bill from our office once a month

## 2021-07-29 ENCOUNTER — Encounter (HOSPITAL_COMMUNITY): Payer: Self-pay | Admitting: Cardiology

## 2021-07-29 ENCOUNTER — Other Ambulatory Visit (HOSPITAL_COMMUNITY): Payer: Self-pay | Admitting: Family Medicine

## 2021-07-30 LAB — SPECIMEN STATUS REPORT

## 2021-07-30 LAB — BASIC METABOLIC PANEL
BUN/Creatinine Ratio: 11 (ref 10–24)
BUN: 23 mg/dL (ref 8–27)
CO2: 28 mmol/L (ref 20–29)
Calcium: 9.5 mg/dL (ref 8.6–10.2)
Chloride: 103 mmol/L (ref 96–106)
Creatinine, Ser: 2.12 mg/dL — ABNORMAL HIGH (ref 0.76–1.27)
Glucose: 189 mg/dL — ABNORMAL HIGH (ref 65–99)
Potassium: 4.3 mmol/L (ref 3.5–5.2)
Sodium: 146 mmol/L — ABNORMAL HIGH (ref 134–144)
eGFR: 33 mL/min/{1.73_m2} — ABNORMAL LOW (ref >59)

## 2021-07-31 ENCOUNTER — Ambulatory Visit (INDEPENDENT_AMBULATORY_CARE_PROVIDER_SITE_OTHER): Payer: Medicare Other

## 2021-07-31 DIAGNOSIS — Z7901 Long term (current) use of anticoagulants: Secondary | ICD-10-CM

## 2021-07-31 DIAGNOSIS — Z7189 Other specified counseling: Secondary | ICD-10-CM | POA: Diagnosis not present

## 2021-07-31 DIAGNOSIS — Z5181 Encounter for therapeutic drug level monitoring: Secondary | ICD-10-CM | POA: Diagnosis not present

## 2021-07-31 LAB — POCT INR: INR: 2.8 (ref 2.0–3.0)

## 2021-07-31 NOTE — Patient Instructions (Signed)
Spoke with patient and instructed him to hold his warfarin prior to procedure on 9/27 as instructed by Dr. Aundra Dubin. Advised him to resume dosage the evening of 9/27 (unless advised otherwise by Dr. Marga Hoots Warfarin 2mg  daily except 4mg  on Tuesdays. Remain consistent with your green leafy vegetables. Recheck INR 1 week after procedure on 10/4 Weekly self tester for insurance purposes. [phone]

## 2021-08-04 ENCOUNTER — Telehealth (HOSPITAL_COMMUNITY): Payer: Self-pay | Admitting: *Deleted

## 2021-08-04 ENCOUNTER — Encounter (HOSPITAL_COMMUNITY)
Admission: RE | Disposition: A | Discharge status: Home / Self Care | Payer: Self-pay | Source: Home / Self Care | Attending: Cardiology

## 2021-08-04 ENCOUNTER — Other Ambulatory Visit: Payer: Self-pay

## 2021-08-04 ENCOUNTER — Ambulatory Visit (HOSPITAL_COMMUNITY)
Admission: RE | Admit: 2021-08-04 | Discharge: 2021-08-04 | Disposition: A | Discharge status: Home / Self Care | Payer: Medicare Other | Attending: Cardiology | Admitting: Cardiology

## 2021-08-04 DIAGNOSIS — Z940 Kidney transplant status: Secondary | ICD-10-CM | POA: Diagnosis not present

## 2021-08-04 DIAGNOSIS — I2721 Secondary pulmonary arterial hypertension: Secondary | ICD-10-CM | POA: Insufficient documentation

## 2021-08-04 DIAGNOSIS — Z8249 Family history of ischemic heart disease and other diseases of the circulatory system: Secondary | ICD-10-CM | POA: Diagnosis not present

## 2021-08-04 DIAGNOSIS — Z886 Allergy status to analgesic agent status: Secondary | ICD-10-CM | POA: Diagnosis not present

## 2021-08-04 DIAGNOSIS — Z66 Do not resuscitate: Secondary | ICD-10-CM | POA: Diagnosis not present

## 2021-08-04 DIAGNOSIS — T8612 Kidney transplant failure: Secondary | ICD-10-CM | POA: Insufficient documentation

## 2021-08-04 DIAGNOSIS — G4730 Sleep apnea, unspecified: Secondary | ICD-10-CM | POA: Diagnosis not present

## 2021-08-04 DIAGNOSIS — Z7984 Long term (current) use of oral hypoglycemic drugs: Secondary | ICD-10-CM | POA: Insufficient documentation

## 2021-08-04 DIAGNOSIS — N186 End stage renal disease: Secondary | ICD-10-CM | POA: Diagnosis not present

## 2021-08-04 DIAGNOSIS — Z79899 Other long term (current) drug therapy: Secondary | ICD-10-CM | POA: Diagnosis not present

## 2021-08-04 DIAGNOSIS — E785 Hyperlipidemia, unspecified: Secondary | ICD-10-CM | POA: Diagnosis not present

## 2021-08-04 DIAGNOSIS — Z841 Family history of disorders of kidney and ureter: Secondary | ICD-10-CM | POA: Diagnosis not present

## 2021-08-04 DIAGNOSIS — I2510 Atherosclerotic heart disease of native coronary artery without angina pectoris: Secondary | ICD-10-CM | POA: Diagnosis not present

## 2021-08-04 DIAGNOSIS — I5022 Chronic systolic (congestive) heart failure: Secondary | ICD-10-CM | POA: Diagnosis present

## 2021-08-04 DIAGNOSIS — Z7901 Long term (current) use of anticoagulants: Secondary | ICD-10-CM | POA: Insufficient documentation

## 2021-08-04 DIAGNOSIS — Z888 Allergy status to other drugs, medicaments and biological substances status: Secondary | ICD-10-CM | POA: Insufficient documentation

## 2021-08-04 DIAGNOSIS — Z944 Liver transplant status: Secondary | ICD-10-CM | POA: Insufficient documentation

## 2021-08-04 DIAGNOSIS — I4821 Permanent atrial fibrillation: Secondary | ICD-10-CM | POA: Diagnosis not present

## 2021-08-04 DIAGNOSIS — I509 Heart failure, unspecified: Secondary | ICD-10-CM | POA: Diagnosis not present

## 2021-08-04 DIAGNOSIS — I272 Pulmonary hypertension, unspecified: Secondary | ICD-10-CM | POA: Diagnosis not present

## 2021-08-04 DIAGNOSIS — I132 Hypertensive heart and chronic kidney disease with heart failure and with stage 5 chronic kidney disease, or end stage renal disease: Secondary | ICD-10-CM | POA: Insufficient documentation

## 2021-08-04 HISTORY — PX: PRESSURE SENSOR/CARDIOMEMS: CATH118258

## 2021-08-04 LAB — PROTIME-INR
INR: 1.3 — ABNORMAL HIGH (ref 0.8–1.2)
Prothrombin Time: 16.1 seconds — ABNORMAL HIGH (ref 11.4–15.2)

## 2021-08-04 SURGERY — PRESSURE SENSOR/CARDIOMEMS
Anesthesia: LOCAL

## 2021-08-04 MED ORDER — FUROSEMIDE 10 MG/ML IJ SOLN
80.0000 mg | Freq: Once | INTRAMUSCULAR | Status: AC
  Administered 2021-08-04: 80 mg via INTRAVENOUS

## 2021-08-04 MED ORDER — FENTANYL CITRATE (PF) 100 MCG/2ML IJ SOLN
INTRAMUSCULAR | Status: AC
  Filled 2021-08-04: qty 2

## 2021-08-04 MED ORDER — ONDANSETRON HCL 4 MG/2ML IJ SOLN: 4.0000 mg | Freq: Four times a day (QID) | INTRAMUSCULAR | Status: DC | PRN

## 2021-08-04 MED ORDER — TORSEMIDE 60 MG PO TABS: ORAL_TABLET | ORAL | 0 refills | Status: DC

## 2021-08-04 MED ORDER — SODIUM CHLORIDE 0.9 % IV SOLN: INTRAVENOUS | Status: DC

## 2021-08-04 MED ORDER — SODIUM CHLORIDE 0.9% FLUSH: 3.0000 mL | Freq: Two times a day (BID) | INTRAVENOUS | Status: DC

## 2021-08-04 MED ORDER — SODIUM CHLORIDE 0.9% FLUSH: 3.0000 mL | INTRAVENOUS | Status: DC | PRN

## 2021-08-04 MED ORDER — MIDAZOLAM HCL 2 MG/2ML IJ SOLN
INTRAMUSCULAR | Status: AC
  Filled 2021-08-04: qty 2

## 2021-08-04 MED ORDER — HEPARIN (PORCINE) IN NACL 1000-0.9 UT/500ML-% IV SOLN
INTRAVENOUS | Status: DC | PRN
  Administered 2021-08-04 (×2): 500 mL

## 2021-08-04 MED ORDER — SODIUM CHLORIDE 0.9 % IV SOLN: 250.0000 mL | INTRAVENOUS | Status: DC | PRN

## 2021-08-04 MED ORDER — FUROSEMIDE 10 MG/ML IJ SOLN
INTRAMUSCULAR | Status: AC
  Filled 2021-08-04: qty 8

## 2021-08-04 MED ORDER — HYDRALAZINE HCL 20 MG/ML IJ SOLN: 10.0000 mg | INTRAMUSCULAR | Status: DC | PRN

## 2021-08-04 MED ORDER — HEPARIN (PORCINE) IN NACL 1000-0.9 UT/500ML-% IV SOLN
INTRAVENOUS | Status: AC
  Filled 2021-08-04: qty 500

## 2021-08-04 MED ORDER — LIDOCAINE HCL (PF) 1 % IJ SOLN
INTRAMUSCULAR | Status: DC | PRN
  Administered 2021-08-04: 15 mL

## 2021-08-04 MED ORDER — MIDAZOLAM HCL 2 MG/2ML IJ SOLN
INTRAMUSCULAR | Status: DC | PRN
  Administered 2021-08-04: 1 mg via INTRAVENOUS

## 2021-08-04 MED ORDER — POTASSIUM CHLORIDE CRYS ER 20 MEQ PO TBCR: 40.0000 meq | EXTENDED_RELEASE_TABLET | Freq: Three times a day (TID) | ORAL | 6 refills | Status: AC

## 2021-08-04 MED ORDER — LABETALOL HCL 5 MG/ML IV SOLN: 10.0000 mg | INTRAVENOUS | Status: DC | PRN

## 2021-08-04 MED ORDER — ACETAMINOPHEN 325 MG PO TABS: 650.0000 mg | ORAL_TABLET | ORAL | Status: DC | PRN

## 2021-08-04 MED ORDER — FENTANYL CITRATE (PF) 100 MCG/2ML IJ SOLN
INTRAMUSCULAR | Status: DC | PRN
  Administered 2021-08-04: 25 ug via INTRAVENOUS

## 2021-08-04 MED ORDER — LIDOCAINE HCL (PF) 1 % IJ SOLN
INTRAMUSCULAR | Status: AC
  Filled 2021-08-04: qty 30

## 2021-08-04 MED ORDER — IOHEXOL 350 MG/ML SOLN
INTRAVENOUS | Status: DC | PRN
  Administered 2021-08-04: 5 mL

## 2021-08-04 SURGICAL SUPPLY — 12 items
CARDIOMEMS PA SENSOR W/DELIVER (Prosthesis & Implant Heart) ×2 IMPLANT
CATH SWAN GANZ 7F STRAIGHT (CATHETERS) ×2 IMPLANT
PACK CARDIAC CATHETERIZATION (CUSTOM PROCEDURE TRAY) ×2 IMPLANT
SENSOR CARDIOMEMS PA W/DELIVER (Prosthesis & Implant Heart) ×1 IMPLANT
SHEATH FAST CATH 12F 12CM (SHEATH) ×2 IMPLANT
SHEATH PINNACLE 7F 10CM (SHEATH) ×2 IMPLANT
SHEATH PROBE COVER 6X72 (BAG) ×2 IMPLANT
TRANSDUCER W/STOPCOCK (MISCELLANEOUS) ×2 IMPLANT
TUBING ART PRESS 72  MALE/FEM (TUBING) ×2
TUBING ART PRESS 72 MALE/FEM (TUBING) ×1 IMPLANT
WIRE EMERALD 3MM-J .025X260CM (WIRE) ×2 IMPLANT
WIRE NITREX .018X300 STIFF (WIRE) ×2 IMPLANT

## 2021-08-04 NOTE — Interval H&P Note (Signed)
History and Physical Interval Note:  08/04/2021 1:21 PM  [name]  has presented today for surgery, with the diagnosis of chf.  The various methods of treatment have been discussed with the patient and family. After consideration of risks, benefits and other options for treatment, the patient has consented to  Procedure(s): PRESSURE SENSOR/CARDIOMEMS (N/A) as a surgical intervention.  The patient's history has been reviewed, patient examined, no change in status, stable for surgery.  I have reviewed the patient's chart and labs.  Questions were answered to the patient's satisfaction.     Yahya Boldman [employer]

## 2021-08-04 NOTE — Progress Notes (Signed)
Pt ambulated without difficulty or bleeding.   Discharged home with his brother who will drive and stay with pt x 24 hrs.

## 2021-08-04 NOTE — Telephone Encounter (Signed)
Done

## 2021-08-04 NOTE — Discharge Instructions (Signed)
1. Increase torsemide to 60 mg in the morning and 40 mg in the evening.  2. Take potassium 40 mEq (two 20 mEq tabs) three times a day.   3. Start back on warfarin this evening.

## 2021-08-04 NOTE — Telephone Encounter (Signed)
Spoke w/pt, he is aware, agreeable, and verbalized understanding of medication changes. He states he has all meds at home, no rx's needed at this time. Pt is sch for f/u in our office on Fri 9/30 and will keep that appt.

## 2021-08-04 NOTE — Telephone Encounter (Signed)
-----   Message from Larey Dresser, MD sent at 08/04/2021  2:42 PM EDT ----- Make sure that [name] has followup.  I told him today to increase torsemide to 60 qam/40 qpm and continue KCl 40 mEq tid.  Start back on warfarin tonight.  He needs BMET in 1 week and make sure he has followup.  I could not get in touch with his brother so could someone please go over the instructions with him and make sure he has the followup.    Amy, add him to Cardiomems list.

## 2021-08-04 NOTE — Progress Notes (Signed)
Dr Aundra Dubin made aware of pt's shortness of breath and decreased O2 level in 70's and 80's at times, order obtained, safety maintained, see flowsheets for VS

## 2021-08-04 NOTE — Progress Notes (Signed)
SITE AREA: right groin/femoral  SITE PRIOR TO REMOVAL:  LEVEL 0  PRESSURE APPLIED FOR: approximately 10 minutes  MANUAL: yes  PATIENT STATUS DURING PULL: stable  POST PULL SITE:  LEVEL 0  POST PULL INSTRUCTIONS GIVEN: yes  POST PULL PULSES PRESENT: bilateral pedal pulses at +1  DRESSING APPLIED: gauze with tegaderm  BEDREST BEGINS @ 1510  COMMENTS:

## 2021-08-05 ENCOUNTER — Encounter (HOSPITAL_COMMUNITY): Payer: Self-pay | Admitting: Cardiology

## 2021-08-06 ENCOUNTER — Other Ambulatory Visit (HOSPITAL_COMMUNITY): Payer: Self-pay | Admitting: Cardiology

## 2021-08-07 ENCOUNTER — Ambulatory Visit (HOSPITAL_COMMUNITY)
Admission: RE | Admit: 2021-08-07 | Discharge: 2021-08-07 | Disposition: A | Discharge status: Home / Self Care | Payer: Medicare Other | Source: Ambulatory Visit | Attending: Family Medicine | Admitting: Family Medicine

## 2021-08-07 ENCOUNTER — Encounter (HOSPITAL_COMMUNITY): Payer: Self-pay

## 2021-08-07 ENCOUNTER — Other Ambulatory Visit: Payer: Self-pay

## 2021-08-07 VITALS — BP 120/69 | HR 86 | Wt 193.0 lb

## 2021-08-07 DIAGNOSIS — I5023 Acute on chronic systolic (congestive) heart failure: Secondary | ICD-10-CM | POA: Insufficient documentation

## 2021-08-07 DIAGNOSIS — D472 Monoclonal gammopathy: Secondary | ICD-10-CM | POA: Diagnosis not present

## 2021-08-07 DIAGNOSIS — N186 End stage renal disease: Secondary | ICD-10-CM | POA: Diagnosis not present

## 2021-08-07 DIAGNOSIS — I5022 Chronic systolic (congestive) heart failure: Secondary | ICD-10-CM | POA: Diagnosis not present

## 2021-08-07 DIAGNOSIS — Z944 Liver transplant status: Secondary | ICD-10-CM | POA: Insufficient documentation

## 2021-08-07 DIAGNOSIS — R0902 Hypoxemia: Secondary | ICD-10-CM | POA: Insufficient documentation

## 2021-08-07 DIAGNOSIS — G4700 Insomnia, unspecified: Secondary | ICD-10-CM | POA: Diagnosis not present

## 2021-08-07 DIAGNOSIS — I255 Ischemic cardiomyopathy: Secondary | ICD-10-CM | POA: Diagnosis not present

## 2021-08-07 DIAGNOSIS — D696 Thrombocytopenia, unspecified: Secondary | ICD-10-CM | POA: Diagnosis not present

## 2021-08-07 DIAGNOSIS — I472 Ventricular tachycardia, unspecified: Secondary | ICD-10-CM

## 2021-08-07 DIAGNOSIS — N183 Chronic kidney disease, stage 3 unspecified: Secondary | ICD-10-CM | POA: Diagnosis not present

## 2021-08-07 DIAGNOSIS — I252 Old myocardial infarction: Secondary | ICD-10-CM | POA: Diagnosis not present

## 2021-08-07 DIAGNOSIS — Z8249 Family history of ischemic heart disease and other diseases of the circulatory system: Secondary | ICD-10-CM | POA: Insufficient documentation

## 2021-08-07 DIAGNOSIS — D693 Immune thrombocytopenic purpura: Secondary | ICD-10-CM | POA: Diagnosis not present

## 2021-08-07 DIAGNOSIS — Z7984 Long term (current) use of oral hypoglycemic drugs: Secondary | ICD-10-CM | POA: Diagnosis not present

## 2021-08-07 DIAGNOSIS — F32A Depression, unspecified: Secondary | ICD-10-CM | POA: Insufficient documentation

## 2021-08-07 DIAGNOSIS — Z79899 Other long term (current) drug therapy: Secondary | ICD-10-CM | POA: Diagnosis not present

## 2021-08-07 DIAGNOSIS — Z951 Presence of aortocoronary bypass graft: Secondary | ICD-10-CM | POA: Insufficient documentation

## 2021-08-07 DIAGNOSIS — Z7952 Long term (current) use of systemic steroids: Secondary | ICD-10-CM | POA: Insufficient documentation

## 2021-08-07 DIAGNOSIS — I959 Hypotension, unspecified: Secondary | ICD-10-CM | POA: Insufficient documentation

## 2021-08-07 DIAGNOSIS — Z7901 Long term (current) use of anticoagulants: Secondary | ICD-10-CM | POA: Insufficient documentation

## 2021-08-07 DIAGNOSIS — Z940 Kidney transplant status: Secondary | ICD-10-CM | POA: Insufficient documentation

## 2021-08-07 DIAGNOSIS — I4821 Permanent atrial fibrillation: Secondary | ICD-10-CM | POA: Diagnosis not present

## 2021-08-07 DIAGNOSIS — I132 Hypertensive heart and chronic kidney disease with heart failure and with stage 5 chronic kidney disease, or end stage renal disease: Secondary | ICD-10-CM | POA: Insufficient documentation

## 2021-08-07 DIAGNOSIS — Z66 Do not resuscitate: Secondary | ICD-10-CM | POA: Insufficient documentation

## 2021-08-07 DIAGNOSIS — I25810 Atherosclerosis of coronary artery bypass graft(s) without angina pectoris: Secondary | ICD-10-CM | POA: Diagnosis not present

## 2021-08-07 DIAGNOSIS — G4734 Idiopathic sleep related nonobstructive alveolar hypoventilation: Secondary | ICD-10-CM

## 2021-08-07 LAB — BASIC METABOLIC PANEL
Anion gap: 7 (ref 5–15)
BUN: 15 mg/dL (ref 8–23)
CO2: 29 mmol/L (ref 22–32)
Calcium: 9.1 mg/dL (ref 8.9–10.3)
Chloride: 105 mmol/L (ref 98–111)
Creatinine, Ser: 2.05 mg/dL — ABNORMAL HIGH (ref 0.61–1.24)
GFR, Estimated: 35 mL/min — ABNORMAL LOW (ref >60)
Glucose, Bld: 116 mg/dL — ABNORMAL HIGH (ref 70–99)
Potassium: 3.8 mmol/L (ref 3.5–5.1)
Sodium: 141 mmol/L (ref 135–145)

## 2021-08-07 MED ORDER — TORSEMIDE 20 MG PO TABS: ORAL_TABLET | ORAL | 3 refills | Status: DC

## 2021-08-07 NOTE — Progress Notes (Signed)
[name], MD Heme/Onc: Dr. Hosie Poisson HF Cardiologist: Dr. Aundra Dubin  HPI: This is a 69 y.o. 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 [name], depression, chronic ITP. Has been on midodrine in past for hypotension, however; does not recall ever taking the medicine.    He has been a DNR. Reports poor quality of life for several years. Weak and dyspnea with minimal activity.  Had multiple admissions for acute on chronic CHF several years ago but none in the last 2 years until recently.   Patient admitted to [facility] at end of June with acute on chronic CHF. Diuresed with IV lasix. Sent home on prior home dose furosemide at 40 mg BID.  Metolazone was stopped.  Echo with EF 45%. Coreg held d/t hypotension and started on Toprol xl at D/C. Not on SGLT2i d/t cost. Initiated on losartan.    Seen in ED 07/21 with lightheadedness and hypotension. No orthostasis. Losartan and metoprolol held.   Has multiple dental caries treated along with several extractions at [facility] 08/02.   He was seen in clinic by Dr. [name] and appeared volume overloaded. Short of breath with 10# lb weight gain. Had restarted metolazone on his own a week prior.    Admission recommended for diuresis and possible inotropic support. Sent to ED for evaluation. He was given 40 mg lasix IV X2 and admitted to cardiology service for management of acute on chronic CHF. Midodrine held since blood pressure okay. Remained volume up and subsequently started on 80 mg lasix IV BID. AHF consulted for further management. Echo with EF 45-50%, multiple WMA, RV okay, PASP 55 mmHg, LA severely dilated, mild TR, trial MR. Concern for cardiac amyloid with LVH and he underwent PYP scan which was not suggestive of TTR cardiac  amyloidosis. He underwent RHC showing elevated right and left filling pressures; RA mean 13, RV 53/15, PA 63/23, mean 39, PCWP mean 20, CO/CI 6.3/3.07, PVR 3 WU. Metolazone given and he was eventually transitioned to po torsemide.  Post hospitalization HF follow up, weight at home 190 pounds, volume was up 2/2 dietary indiscretion. Torsemide increased to 60 mg bid x 3 days.  S/p Cardiomems implant 08/04/21. Elevated R/L filling pressures, torsemide increased to 60/40 mg daily.  Today he returns for HF follow up. Feels like swelling has gone down, some SOB but mostly fatigued today. Denies CP, dizziness, edema, or PND/Orthopnea. Appetite poor. No fever or chills. Weight at home 188 pounds. Taking all medications. Eating soup, trying to eat less salty foods and [employer].  Cardiomems: PAD 33 today, 28 on day of implant. No goal yet.  Past Medical History:  Diagnosis Date   Atrial fibrillation ([place]) 08/2009   CAD (coronary artery disease)    s/p CABG -- 1992   ESRD (end stage renal disease) (HCC)    HLD (hyperlipidemia)    HTN (hypertension)    Idiopathic thrombocytopenia (HCC)    Current Outpatient Medications  Medication Sig Dispense Refill   allopurinol (ZYLOPRIM) 100 MG tablet Take 100 mg by mouth daily.     amiodarone (PACERONE) 200 MG tablet TAKE 1 TABLET BY MOUTH EVERY DAY 30 tablet 5   atorvastatin (LIPITOR) 20 MG tablet Take 20 mg by mouth at bedtime.  calcitRIOL (ROCALTROL) 0.25 MCG capsule Take 0.25 mcg by mouth daily.     Cholecalciferol (VITAMIN D3) 50 MCG (2000 UT) TABS Take 2,000 Units by mouth daily.     Cyanocobalamin (VITAMIN B12) 1000 MCG TBCR Take 1,000 mcg by mouth daily.     dapagliflozin propanediol (FARXIGA) 10 MG TABS tablet Take 1 tablet (10 mg total) by mouth daily. 30 tablet 6   DULoxetine (CYMBALTA) 60 MG capsule Take 60 mg by mouth at bedtime.     EPINEPHrine (EPI-PEN) 0.3 mg/0.3 mL DEVI Inject 0.3 mg into the muscle once as needed (severe allergic reaction).      ferrous sulfate 325 (65 FE) MG tablet Take 325 mg by mouth daily with breakfast.     fish oil-omega-3 fatty acids 1000 MG capsule Take 1 g by mouth daily.     MAGNESIUM-OXIDE 400 (240 Mg) MG tablet Take 400 mg by mouth 2 (two) times daily.     melatonin 5 MG TABS Take 5 mg by mouth at bedtime.     nitroGLYCERIN (NITROSTAT) 0.4 MG SL tablet Place 0.4 mg under the tongue every 5 (five) minutes as needed for chest pain.     potassium chloride SA (KLOR-CON) 20 MEQ tablet Take 2 tablets (40 mEq total) by mouth 3 (three) times daily. 90 tablet 6   predniSONE (DELTASONE) 5 MG tablet Take 1 tablet (5 mg total) by mouth daily.     QUEtiapine (SEROQUEL) 50 MG tablet Take 150 mg by mouth at bedtime.     tacrolimus (PROGRAF) 0.5 MG capsule Take 0.5 mg by mouth See admin instructions. Takes 0.5 mg  tablet by mouth one day and 1 mg tablets next day (1 and 2 tablets on alternate days)     Torsemide 60 MG TABS Take 60 mg by mouth in the morning AND 40 mg every evening. 30 tablet 0   traZODone (DESYREL) 150 MG tablet Take 150 mg by mouth at bedtime as needed for sleep.      warfarin (COUMADIN) 4 MG tablet TAKE AS DIRECTED BY COUMADIN CLINIC (Patient taking differently: Take 2-4 mg by mouth See admin instructions. Takes 4 mg on Tuesdays and 2 mg on all other days) 65 tablet 1   zolpidem (AMBIEN) 5 MG tablet Take 5 mg by mouth at bedtime as needed for sleep.     No current facility-administered medications for this encounter.   Allergies  Allergen Reactions   Other Anaphylaxis    Spider venom   Grapefruit Concentrate Other (See Comments)    Pt has been told not to eat grapefruit   Haloperidol Other (See Comments)    Caused the patient to be overly-sedated   Nsaids Other (See Comments)    Cannot take due to liver transplant   Rifampin Other (See Comments)    Caused flushing   Triazolam Other (See Comments)    Caused the patient to be overly-sedated   Social History   Socioeconomic History   Marital  status: Single    Spouse name: Not on file   Number of children: 0   Years of education: Not on file   Highest education level: Not on file  Occupational History   Occupation: retired  Tobacco Use   Smoking status: Never   Smokeless tobacco: Never  Substance and Sexual Activity   Alcohol use: No   Drug use: No   Sexual activity: Not on file  Other Topics Concern   Not on file  Social History Narrative  Not on file   Social Determinants of Health   Financial Resource Strain: Low Risk    Difficulty of Paying Living Expenses: Not very hard  Food Insecurity: No Food Insecurity   Worried About Running Out of Food in the Last Year: Never true   Ran Out of Food in the Last Year: Never true  Transportation Needs: No Transportation Needs   Lack of Transportation (Medical): No   Lack of Transportation (Non-Medical): No  Physical Activity: Not on file  Stress: Not on file  Social Connections: Not on file  Intimate Partner Violence: Not on file   Family History  Problem Relation Age of Onset   Atrial fibrillation Mother    Breast cancer Mother    Renal cancer Father    Hypertension Brother    Hyperlipidemia Brother    Other Brother        stent    Renal Disease Maternal Grandmother    Stroke Maternal Grandmother    Heart attack Maternal Grandfather    Cancer Paternal Grandmother    Heart failure Paternal Grandfather    Emphysema Paternal Grandfather    Bronchiolitis Paternal Grandfather    BP 120/69   Pulse 86   Wt 87.5 kg   SpO2 96%   BMI 27.69 kg/m   Wt Readings from Last 3 Encounters:  08/07/21 87.5 kg  08/04/21 86.2 kg  07/17/21 87 kg   PHYSICAL EXAM: General:  NAD. No resp difficulty HEENT: Normal Neck: Supple. Thick neck, JVP ~7-8. Carotids 2+ bilat; no bruits. No lymphadenopathy or thryomegaly appreciated. Cor: PMI nondisplaced. Irregular rate & rhythm. No rubs, gallops or murmurs. Lungs: Clear Abdomen: Soft, nontender, +distended. No  hepatosplenomegaly. No bruits or masses. Good bowel sounds. Extremities: No cyanosis, clubbing, rash, edema Neuro: Alert & oriented x 3, cranial nerves grossly intact. Moves all 4 extremities w/o difficulty. Affect pleasant.  ASSESSMENT & PLAN: 1. Acute on Chronic HF with mid range EF: Ischemic cardiomyopathy.  Echo this admission with EF 45-50%, moderate LVH, RV normal, PASP 55 mmHg. Recent admission in 6/22 with CHF.  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  Also concern for possible cardiac amyloidosis with LVH.  RHC with elevated right and left heart filling pressures. PYP scan not suggestive of TTR cardiac amyloidosis. 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 Better NYHA II, although he remains mildly volume overloaded on exam today, Cardiomems PAD 33 today. - Increase torsemide to 60 mg bid x 2 days, then back to 60/40. BMET today, repeat in 10 days. May ultimately require a dose of IV lasix if volume remains elevated. - Continue Farxiga 10 mg daily.   - Discussed strict fluid and salt restrictions. 2. CKD: Stage 3b. 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  BMET today. - Follows with Dr. Moshe Cipro. - On tacrolimus and prednisone.  3. VT: Patient has history of VT, maintained on amiodarone.  4. Atrial fibrillation: Permanent x years.  Rate controlled. Unlikely to be able to regain NSR for any significant period of time, would not recommend ablation.  - Continue warfarin.  INR followed by Coumadin Clinic. - He has been seen at [facility] for a second opinion. Per chart review, was recommended to follow up with [facility] AHF and EP. 5. CAD: s/p CABG 1992.  Last LHC in 2019 in  setting of VT with patent LIMA to LAD, patent SVG to PDA, moderate stenosis SVG to ramus,  occluded SVG to OM1 (medical therapy suggested d/t comorbidities and thrombocytopenia with need for anticoagulation).  No chest pain.   - Continue statin.  - On warfarin so ASA not needed.  6. S/p liver transplant: Followed [facility].  7. Nocturnal hypoxemia: O2 desaturations noted while sleeping inpatient -? Sleep apnea as above. Discussed sleep study, he suffers with insomnia and would like to defer for now.   Follow up in 2 weeks with APP to reassess volume. Will follow renal function and Cardiomems closely.  [place], FNP 08/07/21

## 2021-08-07 NOTE — Patient Instructions (Addendum)
Labs done today. We will contact you only if your labs are abnormal.  INCREASE Torsemide to 60mg  (3 tablets) by mouth 2 times daily for 3 days THEN DECREASE to 60mg  (3 tablets) by mouth every morning and 40mg  (2 tablets) by mouth every evening.   No other medication changes were made. Please continue all current medications as prescribed.  Your physician recommends that you schedule a follow-up appointment in: 10 days for a lab only appointment(can be done at [place]), 3 weeks for an appointment with our APP Clinic here in our office. Please be sure to keep your pending appointment with Dr. Aundra Dubin  If you have any questions or concerns before your next appointment please send [place] a message through [facility] or call our office at [phone].    TO LEAVE A MESSAGE FOR THE NURSE SELECT OPTION 2, PLEASE LEAVE A MESSAGE INCLUDING: YOUR NAME DATE OF BIRTH CALL BACK NUMBER REASON FOR CALL**this is important as we prioritize the call backs  YOU WILL RECEIVE A CALL BACK THE SAME DAY AS LONG AS YOU CALL BEFORE 4:00 PM   Do the following things EVERYDAY: Weigh yourself in the morning before breakfast. Write it down and keep it in a log. Take your medicines as prescribed Eat low salt foods--Limit salt (sodium) to 2000 mg per day.  Stay as active as you can everyday Limit all fluids for the day to less than 2 liters   At the [facility], you and your health needs are our priority. As part of our continuing mission to provide you with exceptional heart care, we have created designated Provider Care Teams. These Care Teams include your primary Cardiologist (physician) and Advanced Practice Providers (APPs- Physician Assistants and Nurse Practitioners) who all work together to provide you with the care you need, when you need it.   You may see any of the following providers on your designated Care Team at your next follow up: Dr Glori Bickers Dr Haynes Kerns,  NP Lyda Jester, [place] Audry Riles, PharmD   Please be sure to bring in all your medications bottles to every appointment.

## 2021-08-11 ENCOUNTER — Ambulatory Visit (INDEPENDENT_AMBULATORY_CARE_PROVIDER_SITE_OTHER): Payer: Medicare Other

## 2021-08-11 DIAGNOSIS — I4821 Permanent atrial fibrillation: Secondary | ICD-10-CM | POA: Diagnosis not present

## 2021-08-11 DIAGNOSIS — Z7901 Long term (current) use of anticoagulants: Secondary | ICD-10-CM | POA: Diagnosis not present

## 2021-08-11 DIAGNOSIS — Z7189 Other specified counseling: Secondary | ICD-10-CM | POA: Diagnosis not present

## 2021-08-11 LAB — POCT INR: INR: 1.5 — AB (ref 2.0–3.0)

## 2021-08-11 NOTE — Patient Instructions (Signed)
Description   Spoke with patient and instructed him to take 6mg  today, then resume same dosage of Warfarin 2mg  daily except 4mg  on Tuesdays. Remain consistent with your green leafy vegetables. Recheck INR 1 week. Weekly Personal assistant for insurance purposes. [phone]

## 2021-08-13 ENCOUNTER — Other Ambulatory Visit (HOSPITAL_COMMUNITY): Payer: Self-pay | Admitting: Cardiology

## 2021-08-18 ENCOUNTER — Ambulatory Visit (INDEPENDENT_AMBULATORY_CARE_PROVIDER_SITE_OTHER): Payer: Medicare Other

## 2021-08-18 DIAGNOSIS — I4821 Permanent atrial fibrillation: Secondary | ICD-10-CM

## 2021-08-18 DIAGNOSIS — Z7901 Long term (current) use of anticoagulants: Secondary | ICD-10-CM | POA: Diagnosis not present

## 2021-08-18 DIAGNOSIS — Z7189 Other specified counseling: Secondary | ICD-10-CM

## 2021-08-18 LAB — POCT INR: INR: 2 (ref 2.0–3.0)

## 2021-08-18 NOTE — Patient Instructions (Signed)
Description   Spoke with patient and instructed him to continue same dosage of Warfarin 2mg  daily except 4mg  on Tuesdays. Remain consistent with your green leafy vegetables. Recheck INR 1 week. Weekly Personal assistant for insurance purposes. [phone]

## 2021-08-20 ENCOUNTER — Other Ambulatory Visit (HOSPITAL_COMMUNITY): Payer: Self-pay | Admitting: Family Medicine

## 2021-08-21 LAB — BASIC METABOLIC PANEL
BUN/Creatinine Ratio: 9 — ABNORMAL LOW (ref 10–24)
BUN: 18 mg/dL (ref 8–27)
CO2: 25 mmol/L (ref 20–29)
Calcium: 9.4 mg/dL (ref 8.6–10.2)
Chloride: 100 mmol/L (ref 96–106)
Creatinine, Ser: 1.98 mg/dL — ABNORMAL HIGH (ref 0.76–1.27)
Glucose: 172 mg/dL — ABNORMAL HIGH (ref 70–99)
Potassium: 3.9 mmol/L (ref 3.5–5.2)
Sodium: 142 mmol/L (ref 134–144)
eGFR: 36 mL/min/{1.73_m2} — ABNORMAL LOW (ref >59)

## 2021-08-25 LAB — POCT INR: INR: 2 (ref 2.0–3.0)

## 2021-08-25 NOTE — Progress Notes (Signed)
[name], MD Heme/Onc: Dr. Hosie Poisson HF Cardiologist: Dr. Aundra Dubin  HPI: [name] 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 [name], depression, chronic ITP. Has been on midodrine in past for hypotension, however; does not recall ever taking the medicine.    He has been a DNR. Reports poor quality of life for several years. Weak and dyspnea with minimal activity.  Had multiple admissions for acute on chronic CHF several years ago but none in the last 2 years until recently.   Patient admitted to [facility] at end of June with acute on chronic CHF. Diuresed with IV lasix. Sent home on prior home dose furosemide at 40 mg BID.  Metolazone was stopped.  Echo with EF 45%. Coreg held d/t hypotension and started on Toprol xl at D/C. Not on SGLT2i d/t cost. Initiated on losartan.    Seen in ED 07/21 with lightheadedness and hypotension. No orthostasis. Losartan and metoprolol held.   Has multiple dental caries treated along with several extractions at [facility] 06/09/21.   He was seen in clinic by Dr. [name] and appeared volume overloaded. Short of breath with 10# lb weight gain. Had restarted metolazone on his own a week prior.    Admission recommended for diuresis and possible inotropic support. Sent to ED for evaluation 8/22. He was given 40 mg lasix IV X2 and admitted to cardiology service for management of acute on chronic CHF. Midodrine held since blood pressure okay. Remained volume up and subsequently started on 80 mg lasix IV BID. AHF consulted for further management. Echo with EF 45-50%, multiple WMA, RV okay, PASP 55 mmHg, LA severely dilated, mild TR, trial MR. Concern for cardiac amyloid with LVH and he underwent PYP scan which was not suggestive of TTR  cardiac amyloidosis. He underwent RHC showing elevated right and left filling pressures; RA mean 13, RV 53/15, PA 63/23, mean 39, PCWP mean 20, CO/CI 6.3/3.07, PVR 3 WU. Metolazone given and he was eventually transitioned to po torsemide.  S/p Cardiomems implant 08/04/21. Elevated R/L filling pressures, torsemide increased to 60/40 mg daily.  Today he returns for HF follow up. Overall feeling fine. No significant exertional dyspnea, but not very active. Belly does not feel as tight. Denies CP, dizziness, edema, or PND/Orthopnea. Appetite poor. No fever or chills. Weight at home 184 pounds. Taking all medications. No more eating out at [employer]  Cardiomems: PAD 25 today, 28 on day of implant. No goal yet.  Past Medical History:  Diagnosis Date   Atrial fibrillation ([place]) 08/2009   CAD (coronary artery disease)    s/p CABG -- 1992   ESRD (end stage renal disease) (HCC)    HLD (hyperlipidemia)    HTN (hypertension)    Idiopathic thrombocytopenia (HCC)    Current Outpatient Medications  Medication Sig Dispense Refill   allopurinol (ZYLOPRIM) 100 MG tablet Take 100 mg by mouth daily.     amiodarone (PACERONE) 200 MG tablet TAKE 1 TABLET BY MOUTH EVERY DAY 30 tablet 5   atorvastatin (LIPITOR) 20 MG tablet Take 20 mg by mouth at bedtime.      calcitRIOL (ROCALTROL) 0.25 MCG capsule Take 0.25 mcg by mouth daily.     Cholecalciferol (VITAMIN D3) 50 MCG (2000 UT) TABS  Take 2,000 Units by mouth daily.     Cyanocobalamin (VITAMIN B12) 1000 MCG TBCR Take 1,000 mcg by mouth daily.     dapagliflozin propanediol (FARXIGA) 10 MG TABS tablet Take 1 tablet (10 mg total) by mouth daily. 30 tablet 6   DULoxetine (CYMBALTA) 60 MG capsule Take 60 mg by mouth at bedtime.     EPINEPHrine (EPI-PEN) 0.3 mg/0.3 mL DEVI Inject 0.3 mg into the muscle once as needed (severe allergic reaction).     ferrous sulfate 325 (65 FE) MG tablet Take 325 mg by mouth daily with breakfast.     fish oil-omega-3 fatty acids 1000 MG  capsule Take 1 g by mouth 2 (two) times daily.     MAGNESIUM-OXIDE 400 (240 Mg) MG tablet Take 400 mg by mouth 2 (two) times daily.     melatonin 5 MG TABS Take 5 mg by mouth at bedtime.     nitroGLYCERIN (NITROSTAT) 0.4 MG SL tablet Place 0.4 mg under the tongue every 5 (five) minutes as needed for chest pain.     potassium chloride SA (KLOR-CON) 20 MEQ tablet Take 2 tablets (40 mEq total) by mouth 3 (three) times daily. 90 tablet 6   predniSONE (DELTASONE) 5 MG tablet Take 1 tablet (5 mg total) by mouth daily.     QUEtiapine (SEROQUEL) 50 MG tablet Take 150 mg by mouth at bedtime.     tacrolimus (PROGRAF) 0.5 MG capsule Take 0.5 mg by mouth See admin instructions. Takes 0.5 mg  tablet by mouth one day and 1 mg tablets next day (1 and 2 tablets on alternate days)     torsemide (DEMADEX) 20 MG tablet Take 3 tablets (60 mg total) by mouth every morning AND 2 tablets (40 mg total) every evening. 450 tablet 3   traZODone (DESYREL) 150 MG tablet Take 150 mg by mouth at bedtime as needed for sleep.      warfarin (COUMADIN) 4 MG tablet TAKE AS DIRECTED BY COUMADIN CLINIC (Patient taking differently: Take 2-4 mg by mouth See admin instructions. Takes 4 mg on Tuesdays and 2 mg on all other days) 65 tablet 1   zolpidem (AMBIEN) 5 MG tablet Take 5 mg by mouth at bedtime as needed for sleep.     No current facility-administered medications for this encounter.   Allergies  Allergen Reactions   Other Anaphylaxis    Spider venom   Grapefruit Concentrate Other (See Comments)    Pt has been told not to eat grapefruit   Haloperidol Other (See Comments)    Caused the patient to be overly-sedated   Nsaids Other (See Comments)    Cannot take due to liver transplant   Rifampin Other (See Comments)    Caused flushing   Triazolam Other (See Comments)    Caused the patient to be overly-sedated   Social History   Socioeconomic History   Marital status: Single    Spouse name: Not on file   Number of  children: 0   Years of education: Not on file   Highest education level: Not on file  Occupational History   Occupation: retired  Tobacco Use   Smoking status: Never   Smokeless tobacco: Never  Substance and Sexual Activity   Alcohol use: No   Drug use: No   Sexual activity: Not on file  Other Topics Concern   Not on file  Social History Narrative   Not on file   Social Determinants of Health   Financial Resource Strain:  Low Risk    Difficulty of Paying Living Expenses: Not very hard  Food Insecurity: No Food Insecurity   Worried About Running Out of Food in the Last Year: Never true   Ran Out of Food in the Last Year: Never true  Transportation Needs: No Transportation Needs   Lack of Transportation (Medical): No   Lack of Transportation (Non-Medical): No  Physical Activity: Not on file  Stress: Not on file  Social Connections: Not on file  Intimate Partner Violence: Not on file   Family History  Problem Relation Age of Onset   Atrial fibrillation Mother    Breast cancer Mother    Renal cancer Father    Hypertension Brother    Hyperlipidemia Brother    Other Brother        stent    Renal Disease Maternal Grandmother    Stroke Maternal Grandmother    Heart attack Maternal Grandfather    Cancer Paternal Grandmother    Heart failure Paternal Grandfather    Emphysema Paternal Grandfather    Bronchiolitis Paternal Grandfather    BP 102/70   Pulse 91   Wt 85.6 kg (188 lb 12.8 oz)   SpO2 93%   BMI 27.09 kg/m   Wt Readings from Last 3 Encounters:  08/28/21 85.6 kg (188 lb 12.8 oz)  08/07/21 87.5 kg (193 lb)  08/04/21 86.2 kg (190 lb)   PHYSICAL EXAM: General:  NAD. No resp difficulty HEENT: Normal, R ear HOH Neck: Supple. No JVD. Carotids 2+ bilat; no bruits. No lymphadenopathy or thryomegaly appreciated. Cor: PMI nondisplaced. Irregular rate & rhythm. No rubs, gallops or murmurs. Lungs: Clear Abdomen: Obese, nontender, nondistended. No hepatosplenomegaly.  No bruits or masses. Good bowel sounds. Extremities: No cyanosis, clubbing, rash, edema Neuro: Alert & oriented x 3, cranial nerves grossly intact. Moves all 4 extremities w/o difficulty. Affect pleasant.  ASSESSMENT & PLAN: 1. Chronic HF with mid range EF: Ischemic cardiomyopathy.  Echo this admission with EF 45-50%, moderate LVH, RV normal, PASP 55 mmHg. Recent admission in 6/22 with CHF.  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  Also concern for possible cardiac amyloidosis with LVH.  RHC with elevated right and left heart filling pressures. PYP scan not suggestive of TTR cardiac amyloidosis. 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 Better NYHA II, he is not volume overloaded on exam today, Cardiomems PAD 25 today. - Continue torsemide 60 mg q AM/40 mg q PM. BMET today. - Continue Farxiga 10 mg daily.   - Discussed strict fluid and salt restrictions. 2. CKD: Stage 3b. 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  BMET today. - Follows with Dr. Moshe Cipro. - On tacrolimus and prednisone.  3. VT: Patient has history of VT, maintained on amiodarone.  4. Atrial fibrillation: Permanent x years.  Rate controlled. Unlikely to be able to regain NSR for any significant period of time, would not recommend ablation.  - Continue warfarin.  INR followed by [facility]. - He has been seen at [facility] for a second opinion. Per chart review, was recommended to follow up with [facility] AHF and EP. 5. CAD: s/p CABG 1992.  Last LHC in 2019 in setting of VT with patent LIMA to LAD, patent SVG to PDA, moderate stenosis SVG to ramus, occluded SVG to OM1 (medical therapy suggested  d/t comorbidities and thrombocytopenia with need for anticoagulation).  No chest pain.   - Continue statin.   - On warfarin so ASA not needed.  6. S/p liver transplant: Followed at [facility].  7. Nocturnal hypoxemia: O2 desaturations noted while sleeping inpatient -? Sleep apnea as above. Discussed sleep study, he suffers with insomnia and would like to defer for now.   Follow up with Dr. Aundra Dubin next month as scheduled.  Tracy, FNP 08/28/21

## 2021-08-26 ENCOUNTER — Ambulatory Visit (INDEPENDENT_AMBULATORY_CARE_PROVIDER_SITE_OTHER): Payer: Medicare Other

## 2021-08-26 DIAGNOSIS — Z7901 Long term (current) use of anticoagulants: Secondary | ICD-10-CM

## 2021-08-26 DIAGNOSIS — Z7189 Other specified counseling: Secondary | ICD-10-CM

## 2021-08-26 NOTE — Patient Instructions (Signed)
Description   Called  spoke with pt advised to continue on same dosage of Warfarin 2mg  daily except 4mg  on Tuesdays. Remain consistent with your green leafy vegetables. Recheck INR 1 week. Weekly Personal assistant for insurance purposes. [phone]

## 2021-08-28 ENCOUNTER — Other Ambulatory Visit: Payer: Self-pay

## 2021-08-28 ENCOUNTER — Encounter (HOSPITAL_COMMUNITY): Payer: Self-pay

## 2021-08-28 ENCOUNTER — Ambulatory Visit (HOSPITAL_COMMUNITY)
Admission: RE | Admit: 2021-08-28 | Discharge: 2021-08-28 | Disposition: A | Discharge status: Home / Self Care | Payer: Medicare Other | Source: Ambulatory Visit | Attending: Family Medicine | Admitting: Family Medicine

## 2021-08-28 VITALS — BP 102/70 | HR 91 | Wt 188.8 lb

## 2021-08-28 DIAGNOSIS — Z79899 Other long term (current) drug therapy: Secondary | ICD-10-CM | POA: Diagnosis not present

## 2021-08-28 DIAGNOSIS — I255 Ischemic cardiomyopathy: Secondary | ICD-10-CM | POA: Insufficient documentation

## 2021-08-28 DIAGNOSIS — N183 Chronic kidney disease, stage 3 unspecified: Secondary | ICD-10-CM

## 2021-08-28 DIAGNOSIS — N186 End stage renal disease: Secondary | ICD-10-CM | POA: Diagnosis not present

## 2021-08-28 DIAGNOSIS — Z8249 Family history of ischemic heart disease and other diseases of the circulatory system: Secondary | ICD-10-CM | POA: Diagnosis not present

## 2021-08-28 DIAGNOSIS — Z886 Allergy status to analgesic agent status: Secondary | ICD-10-CM | POA: Insufficient documentation

## 2021-08-28 DIAGNOSIS — T8612 Kidney transplant failure: Secondary | ICD-10-CM | POA: Diagnosis not present

## 2021-08-28 DIAGNOSIS — E785 Hyperlipidemia, unspecified: Secondary | ICD-10-CM | POA: Diagnosis not present

## 2021-08-28 DIAGNOSIS — I472 Ventricular tachycardia, unspecified: Secondary | ICD-10-CM | POA: Diagnosis not present

## 2021-08-28 DIAGNOSIS — Z7952 Long term (current) use of systemic steroids: Secondary | ICD-10-CM | POA: Insufficient documentation

## 2021-08-28 DIAGNOSIS — Z940 Kidney transplant status: Secondary | ICD-10-CM | POA: Insufficient documentation

## 2021-08-28 DIAGNOSIS — I25810 Atherosclerosis of coronary artery bypass graft(s) without angina pectoris: Secondary | ICD-10-CM

## 2021-08-28 DIAGNOSIS — G4730 Sleep apnea, unspecified: Secondary | ICD-10-CM | POA: Insufficient documentation

## 2021-08-28 DIAGNOSIS — I2510 Atherosclerotic heart disease of native coronary artery without angina pectoris: Secondary | ICD-10-CM | POA: Insufficient documentation

## 2021-08-28 DIAGNOSIS — I5022 Chronic systolic (congestive) heart failure: Secondary | ICD-10-CM

## 2021-08-28 DIAGNOSIS — I132 Hypertensive heart and chronic kidney disease with heart failure and with stage 5 chronic kidney disease, or end stage renal disease: Secondary | ICD-10-CM | POA: Insufficient documentation

## 2021-08-28 DIAGNOSIS — Z7901 Long term (current) use of anticoagulants: Secondary | ICD-10-CM | POA: Diagnosis not present

## 2021-08-28 DIAGNOSIS — Z888 Allergy status to other drugs, medicaments and biological substances status: Secondary | ICD-10-CM | POA: Insufficient documentation

## 2021-08-28 DIAGNOSIS — Z841 Family history of disorders of kidney and ureter: Secondary | ICD-10-CM | POA: Insufficient documentation

## 2021-08-28 DIAGNOSIS — I4821 Permanent atrial fibrillation: Secondary | ICD-10-CM | POA: Diagnosis not present

## 2021-08-28 DIAGNOSIS — Z66 Do not resuscitate: Secondary | ICD-10-CM | POA: Diagnosis not present

## 2021-08-28 DIAGNOSIS — Z7984 Long term (current) use of oral hypoglycemic drugs: Secondary | ICD-10-CM | POA: Diagnosis not present

## 2021-08-28 DIAGNOSIS — Z944 Liver transplant status: Secondary | ICD-10-CM

## 2021-08-28 DIAGNOSIS — G4734 Idiopathic sleep related nonobstructive alveolar hypoventilation: Secondary | ICD-10-CM

## 2021-08-28 LAB — BASIC METABOLIC PANEL
Anion gap: 9 (ref 5–15)
BUN: 23 mg/dL (ref 8–23)
CO2: 30 mmol/L (ref 22–32)
Calcium: 9.3 mg/dL (ref 8.9–10.3)
Chloride: 98 mmol/L (ref 98–111)
Creatinine, Ser: 2.08 mg/dL — ABNORMAL HIGH (ref 0.61–1.24)
GFR, Estimated: 34 mL/min — ABNORMAL LOW (ref >60)
Glucose, Bld: 135 mg/dL — ABNORMAL HIGH (ref 70–99)
Potassium: 3.8 mmol/L (ref 3.5–5.1)
Sodium: 137 mmol/L (ref 135–145)

## 2021-08-28 NOTE — Patient Instructions (Signed)
Labs done today, your results will be available in MyChart, we will contact you for abnormal readings.  Your physician recommends that you schedule a follow-up appointment in: 09/17/21 as scheduled  If you have any questions or concerns before your next appointment please send [place] a message through [place] or call our office at [phone].    TO LEAVE A MESSAGE FOR THE NURSE SELECT OPTION 2, PLEASE LEAVE A MESSAGE INCLUDING: YOUR NAME DATE OF BIRTH CALL BACK NUMBER REASON FOR CALL**this is important as we prioritize the call backs  YOU WILL RECEIVE A CALL BACK THE SAME DAY AS LONG AS YOU CALL BEFORE 4:00 PM  At the [facility], you and your health needs are our priority. As part of our continuing mission to provide you with exceptional heart care, we have created designated Provider Care Teams. These Care Teams include your primary Cardiologist (physician) and Advanced Practice Providers (APPs- Physician Assistants and Nurse Practitioners) who all work together to provide you with the care you need, when you need it.   You may see any of the following providers on your designated Care Team at your next follow up: Dr Glori Bickers Dr Haynes Kerns, NP Lyda Jester, [place] [facility] [place], [place] Audry Riles, PharmD   Please be sure to bring in all your medications bottles to every appointment.

## 2021-09-02 ENCOUNTER — Telehealth: Payer: Self-pay | Admitting: *Deleted

## 2021-09-02 NOTE — Telephone Encounter (Signed)
Called pt since he was due to have INR done on 09/01/2021; left a message for the pt to call back.

## 2021-09-03 ENCOUNTER — Ambulatory Visit (INDEPENDENT_AMBULATORY_CARE_PROVIDER_SITE_OTHER): Payer: Medicare Other | Admitting: *Deleted

## 2021-09-03 DIAGNOSIS — Z7189 Other specified counseling: Secondary | ICD-10-CM

## 2021-09-03 DIAGNOSIS — Z7901 Long term (current) use of anticoagulants: Secondary | ICD-10-CM | POA: Diagnosis not present

## 2021-09-03 LAB — POCT INR: INR: 2.1 (ref 2.0–3.0)

## 2021-09-03 NOTE — Patient Instructions (Signed)
Description   Called  spoke with pt advised to continue on same dosage of Warfarin 2mg  daily except 4mg  on Tuesdays. Remain consistent with your green leafy vegetables. Recheck INR 1 week. Weekly Personal assistant for insurance purposes. [phone]

## 2021-09-11 ENCOUNTER — Telehealth: Payer: Self-pay

## 2021-09-11 NOTE — Telephone Encounter (Signed)
Attempted to contact pt. Call went straight to vm. Asked pt to call back.

## 2021-09-11 NOTE — Telephone Encounter (Signed)
Pt INR overdue. Called, no answer. LMOM

## 2021-09-13 LAB — POCT INR: INR: 3.4 — AB (ref 2.0–3.0)

## 2021-09-14 ENCOUNTER — Ambulatory Visit (INDEPENDENT_AMBULATORY_CARE_PROVIDER_SITE_OTHER): Payer: Medicare Other

## 2021-09-14 DIAGNOSIS — Z7901 Long term (current) use of anticoagulants: Secondary | ICD-10-CM | POA: Diagnosis not present

## 2021-09-14 DIAGNOSIS — Z7189 Other specified counseling: Secondary | ICD-10-CM

## 2021-09-14 DIAGNOSIS — Z5181 Encounter for therapeutic drug level monitoring: Secondary | ICD-10-CM

## 2021-09-15 NOTE — Patient Instructions (Signed)
Description   Spoke with pt, instructed to hold today's dose and then continue on same dosage of Warfarin 2mg  daily except 4mg  on Tuesdays. Remain consistent with your green leafy vegetables. Recheck INR 1 week. Weekly Personal assistant for insurance purposes. [phone]

## 2021-09-17 ENCOUNTER — Ambulatory Visit (HOSPITAL_COMMUNITY)
Admission: RE | Admit: 2021-09-17 | Discharge: 2021-09-17 | Disposition: A | Discharge status: Home / Self Care | Payer: Medicare Other | Source: Ambulatory Visit | Attending: Cardiology | Admitting: Cardiology

## 2021-09-17 ENCOUNTER — Other Ambulatory Visit: Payer: Self-pay

## 2021-09-17 ENCOUNTER — Encounter (HOSPITAL_COMMUNITY): Payer: Self-pay | Admitting: Cardiology

## 2021-09-17 VITALS — BP 94/60 | HR 95 | Wt 183.4 lb

## 2021-09-17 DIAGNOSIS — Z7901 Long term (current) use of anticoagulants: Secondary | ICD-10-CM | POA: Diagnosis not present

## 2021-09-17 DIAGNOSIS — Z66 Do not resuscitate: Secondary | ICD-10-CM | POA: Insufficient documentation

## 2021-09-17 DIAGNOSIS — Z7952 Long term (current) use of systemic steroids: Secondary | ICD-10-CM | POA: Diagnosis not present

## 2021-09-17 DIAGNOSIS — I4821 Permanent atrial fibrillation: Secondary | ICD-10-CM | POA: Insufficient documentation

## 2021-09-17 DIAGNOSIS — I132 Hypertensive heart and chronic kidney disease with heart failure and with stage 5 chronic kidney disease, or end stage renal disease: Secondary | ICD-10-CM | POA: Insufficient documentation

## 2021-09-17 DIAGNOSIS — R0602 Shortness of breath: Secondary | ICD-10-CM | POA: Diagnosis present

## 2021-09-17 DIAGNOSIS — N186 End stage renal disease: Secondary | ICD-10-CM | POA: Insufficient documentation

## 2021-09-17 DIAGNOSIS — I5022 Chronic systolic (congestive) heart failure: Secondary | ICD-10-CM | POA: Insufficient documentation

## 2021-09-17 DIAGNOSIS — I252 Old myocardial infarction: Secondary | ICD-10-CM | POA: Insufficient documentation

## 2021-09-17 DIAGNOSIS — Z940 Kidney transplant status: Secondary | ICD-10-CM | POA: Insufficient documentation

## 2021-09-17 DIAGNOSIS — G4719 Other hypersomnia: Secondary | ICD-10-CM | POA: Diagnosis not present

## 2021-09-17 DIAGNOSIS — I25810 Atherosclerosis of coronary artery bypass graft(s) without angina pectoris: Secondary | ICD-10-CM | POA: Diagnosis not present

## 2021-09-17 DIAGNOSIS — I255 Ischemic cardiomyopathy: Secondary | ICD-10-CM | POA: Insufficient documentation

## 2021-09-17 DIAGNOSIS — Z944 Liver transplant status: Secondary | ICD-10-CM | POA: Diagnosis not present

## 2021-09-17 LAB — BASIC METABOLIC PANEL
Anion gap: 7 (ref 5–15)
BUN: 15 mg/dL (ref 8–23)
CO2: 27 mmol/L (ref 22–32)
Calcium: 9 mg/dL (ref 8.9–10.3)
Chloride: 102 mmol/L (ref 98–111)
Creatinine, Ser: 2.18 mg/dL — ABNORMAL HIGH (ref 0.61–1.24)
GFR, Estimated: 32 mL/min — ABNORMAL LOW (ref >60)
Glucose, Bld: 180 mg/dL — ABNORMAL HIGH (ref 70–99)
Potassium: 4.9 mmol/L (ref 3.5–5.1)
Sodium: 136 mmol/L (ref 135–145)

## 2021-09-17 LAB — BRAIN NATRIURETIC PEPTIDE: B Natriuretic Peptide: 163.9 pg/mL — ABNORMAL HIGH (ref 0.0–100.0)

## 2021-09-17 MED ORDER — ATORVASTATIN CALCIUM 40 MG PO TABS: 40.0000 mg | ORAL_TABLET | Freq: Every day | ORAL | 3 refills | Status: AC

## 2021-09-17 MED ORDER — TORSEMIDE 20 MG PO TABS: ORAL_TABLET | ORAL | 6 refills | Status: DC

## 2021-09-17 NOTE — Progress Notes (Signed)
ReDS Vest / Clip - 09/17/21 1500       ReDS Vest / Clip   Station Marker C    Ruler Value 27    ReDS Value Range High volume overload    ReDS Actual Value 57

## 2021-09-17 NOTE — Progress Notes (Signed)
Height: 5'10"    Weight: 183 lb BMI: 26.32  Today's Date: 09/17/21  STOP BANG RISK ASSESSMENT S (snore) Have you been told that you snore?     YES   T (tired) Are you often tired, fatigued, or sleepy during the day?   YES  O (obstruction) Do you stop breathing, choke, or gasp during sleep? NO   P (pressure) Do you have or are you being treated for high blood pressure? YES   B (BMI) Is your body index greater than 35 kg/m? NO   A (age) Are you 68 years old or older? YES   N (neck) Do you have a neck circumference greater than 16 inches?      G (gender) Are you a male? YES   TOTAL STOP/BANG "YES" ANSWERS 5                                                                       For Office Use Only              Procedure Order Form    YES to 3+ Stop Bang questions OR two clinical symptoms - patient qualifies for WatchPAT (CPT 95800)      Clinical Notes: Will consult Sleep Specialist and refer for management of therapy due to patient increased risk of Sleep Apnea. Ordering a sleep study due to the following two clinical symptoms: Excessive daytime sleepiness G47.10 / Loud snoring R06.83

## 2021-09-17 NOTE — Patient Instructions (Signed)
Increase Torsemide to 80 mg (4 tabs) in AM and 60 mg (3 tabs) in PM  Increase Atorvastatin to 40 mg Daily  Labs done today, your results will be available in MyChart, we will contact you for abnormal readings.  Your physician recommends that you return for lab work in: 1-2 weeks, we have given you a prescription to have this done locally  A chest x-ray takes a picture of the organs and structures inside the chest, including the heart, lungs, and blood vessels. This test can show several things, including, whether the heart is enlarges; whether fluid is building up in the lungs; and whether pacemaker / defibrillator leads are still in place.  Your physician has recommended that you have a pulmonary function test. Pulmonary Function Tests are a group of tests that measure how well air moves in and out of your lungs.  Your provider has recommended that you have a home sleep study.  We have provided you with the equipment in our office today. Please download the app and follow the instructions. YOUR PIN NUMBER IS: [id number]. Once you have completed the test you just dispose of the equipment, the information is automatically uploaded to [place] via blue-tooth technology. If your test is positive for sleep apnea and you need a home CPAP machine you will be contacted by Dr Theodosia Blender office [facility]) to set this up.  Your physician recommends that you schedule a follow-up appointment in: 1 month  Do the following things EVERYDAY: Weigh yourself in the morning before breakfast. Write it down and keep it in a log. Take your medicines as prescribed Eat low salt foods--Limit salt (sodium) to 2000 mg per day.  Stay as active as you can everyday Limit all fluids for the day to less than 2 liters  If you have any questions or concerns before your next appointment please send [place] a message through [place] or call our office at [phone].    TO LEAVE A MESSAGE FOR THE NURSE SELECT OPTION 2, PLEASE LEAVE A  MESSAGE INCLUDING: YOUR NAME DATE OF BIRTH CALL BACK NUMBER REASON FOR CALL**this is important as we prioritize the call backs  YOU WILL RECEIVE A CALL BACK THE SAME DAY AS LONG AS YOU CALL BEFORE 4:00 PM  At the [facility], you and your health needs are our priority. As part of our continuing mission to provide you with exceptional heart care, we have created designated Provider Care Teams. These Care Teams include your primary Cardiologist (physician) and Advanced Practice Providers (APPs- Physician Assistants and Nurse Practitioners) who all work together to provide you with the care you need, when you need it.   You may see any of the following providers on your designated Care Team at your next follow up: Dr Glori Bickers Dr Haynes Kerns, NP Lyda Jester, [place] [facility] [place], [place] Audry Riles, PharmD   Please be sure to bring in all your medications bottles to every appointment.

## 2021-09-18 ENCOUNTER — Inpatient Hospital Stay (HOSPITAL_COMMUNITY): Admission: RE | Admit: 2021-09-18 | Payer: Medicare Other | Source: Ambulatory Visit

## 2021-09-19 NOTE — Progress Notes (Signed)
[name], MD Heme/Onc: Dr. Hosie Poisson HF Cardiologist: Dr. Aundra Dubin  HPI: [name] 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 [name], depression, chronic ITP. Has been on midodrine in past for hypotension.    He has been a DNR. Reports poor quality of life for several years. Weak and dyspnea with minimal activity.  Had multiple admissions for acute on chronic CHF.   Patient admitted to [facility] at end of June 2022 with acute on chronic CHF. Diuresed with IV lasix. Sent home on prior home dose furosemide at 40 mg BID.  Metolazone was stopped.  Echo with EF 45%. Coreg held d/t hypotension and started on Toprol xl at D/C. Not on SGLT2i due to cost. Initiated on losartan.    Seen in ED 07/22 with lightheadedness and hypotension. No orthostasis. Losartan and metoprolol held.   Has multiple dental caries treated along with several extractions at [facility] 06/09/21.   He was seen in clinic by Dr. [name] and appeared volume overloaded. Short of breath with 10# lb weight gain. Had restarted metolazone on his own a week prior.    Admission recommended for diuresis and possible inotropic support. Sent to ED for evaluation 8/22. He was given 40 mg lasix IV X2 and admitted to cardiology service for management of acute on chronic CHF. Midodrine held since blood pressure okay. Remained volume up and subsequently started on 80 mg lasix IV BID. AHF consulted for further management. Echo with EF 45-50%, multiple WMA, RV okay, PASP 55 mmHg, LA severely dilated, mild TR, trial MR. Concern for cardiac amyloid with LVH and he underwent PYP scan which was not suggestive of TTR cardiac amyloidosis. He underwent RHC showing elevated right and left filling pressures; RA mean 13, RV 53/15,  PA 63/23, mean 39, PCWP mean 20, CO/CI 6.3/3.07, PVR 3 WU. Metolazone given and he was eventually transitioned to po torsemide.  S/p Cardiomems implant 08/04/21. Elevated R/L filling pressures, torsemide increased to 60/40 mg daily.  Today he returns for HF followup. Breathing worse over the last 2-3 weeks.  He is short of breath walking around his house.  No chest pain. No orthopnea/PND.  No lightheadedness.  Weight is actually lower.   Cardiomems: PADP 22 (goal 25)  REDS clip 57% (repeated )  Labs (10/22): LDL 82, HDL 37, K 3.8, creatinine 2.08  PMH: 1. H/o VT: Has refused ICD. 2. Chronic ITP 3. Hyperlipidemia 4. ESRD s/p renal transplant 2008, now off HD.  5. CAD: CABG 1992.  - LHC (9/19): total occlusion of LAD, LCx, RCA.  LIMA-LAD, SVG-ramus, SVG-PDA patent.  Occlusion of SVG-OM1.  Moderate stenosis SVG-ramus, treated medically.  6. MGUS 7. Chronic HF with mid range EF: Ischemic cardiomyopathy.  - PYP scan 8/22: grade 1, H/CL 1.0 => not suggestive of TTR cardiac amyloidoisis.  - RHC (8/22): mean RA 13, PA 63/23 mean 39, mean PCWP 20, CI 3.07, PVR 3 WU.  - Echo (8/22): EF 45-50%, inferoseptal/inferior/inferolateral hypokinesis, normal RV.  - Cardiomems 9/22 8. Liver transplant 2008.  9. Atrial fibrillation: Permanent.  10. Depression   Current Outpatient Medications  Medication Sig Dispense Refill   allopurinol (ZYLOPRIM) 100 MG tablet Take 100 mg by mouth daily.     amiodarone (PACERONE) 200  MG tablet TAKE 1 TABLET BY MOUTH EVERY DAY 30 tablet 5   calcitRIOL (ROCALTROL) 0.25 MCG capsule Take 0.25 mcg by mouth daily.     Cholecalciferol (VITAMIN D3) 50 MCG (2000 UT) TABS Take 2,000 Units by mouth daily.     Cyanocobalamin (VITAMIN B12) 1000 MCG TBCR Take 1,000 mcg by mouth daily.     dapagliflozin propanediol (FARXIGA) 10 MG TABS tablet Take 1 tablet (10 mg total) by mouth daily. 30 tablet 6   DULoxetine (CYMBALTA) 60 MG capsule Take 60 mg by mouth at bedtime.      EPINEPHrine (EPI-PEN) 0.3 mg/0.3 mL DEVI Inject 0.3 mg into the muscle once as needed (severe allergic reaction).     ferrous sulfate 325 (65 FE) MG tablet Take 325 mg by mouth daily with breakfast.     fish oil-omega-3 fatty acids 1000 MG capsule Take 1 g by mouth 2 (two) times daily.     MAGNESIUM-OXIDE 400 (240 Mg) MG tablet Take 400 mg by mouth 2 (two) times daily.     melatonin 5 MG TABS Take 5 mg by mouth at bedtime.     nitroGLYCERIN (NITROSTAT) 0.4 MG SL tablet Place 0.4 mg under the tongue every 5 (five) minutes as needed for chest pain.     potassium chloride SA (KLOR-CON) 20 MEQ tablet Take 2 tablets (40 mEq total) by mouth 3 (three) times daily. 90 tablet 6   predniSONE (DELTASONE) 5 MG tablet Take 1 tablet (5 mg total) by mouth daily.     QUEtiapine (SEROQUEL) 50 MG tablet Take 150 mg by mouth at bedtime.     tacrolimus (PROGRAF) 0.5 MG capsule Take 0.5 mg by mouth See admin instructions. Takes 0.5 mg  tablet by mouth one day and 1 mg tablets next day (1 and 2 tablets on alternate days)     warfarin (COUMADIN) 4 MG tablet TAKE AS DIRECTED BY COUMADIN CLINIC (Patient taking differently: Take 2-4 mg by mouth See admin instructions. Takes 4 mg on Tuesdays and 2 mg on all other days) 65 tablet 1   zolpidem (AMBIEN) 5 MG tablet Take 5 mg by mouth at bedtime as needed for sleep.     atorvastatin (LIPITOR) 40 MG tablet Take 1 tablet (40 mg total) by mouth at bedtime. 90 tablet 3   torsemide (DEMADEX) 20 MG tablet Take 4 tablets (80 mg total) by mouth every morning AND 3 tablets (60 mg total) every evening. 210 tablet 6   No current facility-administered medications for this encounter.   Allergies  Allergen Reactions   Other Anaphylaxis    Spider venom   Grapefruit Concentrate Other (See Comments)    Pt has been told not to eat grapefruit   Haloperidol Other (See Comments)    Caused the patient to be overly-sedated   Nsaids Other (See Comments)    Cannot take due to liver transplant    Rifampin Other (See Comments)    Caused flushing   Triazolam Other (See Comments)    Caused the patient to be overly-sedated   Social History   Socioeconomic History   Marital status: Single    Spouse name: Not on file   Number of children: 0   Years of education: Not on file   Highest education level: Not on file  Occupational History   Occupation: retired  Tobacco Use   Smoking status: Never   Smokeless tobacco: Never  Substance and Sexual Activity   Alcohol use: No   Drug use: No  Sexual activity: Not on file  Other Topics Concern   Not on file  Social History Narrative   Not on file   Social Determinants of Health   Financial Resource Strain: Low Risk    Difficulty of Paying Living Expenses: Not very hard  Food Insecurity: No Food Insecurity   Worried About Running Out of Food in the Last Year: Never true   Ran Out of Food in the Last Year: Never true  Transportation Needs: No Transportation Needs   Lack of Transportation (Medical): No   Lack of Transportation (Non-Medical): No  Physical Activity: Not on file  Stress: Not on file  Social Connections: Not on file  Intimate Partner Violence: Not on file   Family History  Problem Relation Age of Onset   Atrial fibrillation Mother    Breast cancer Mother    Renal cancer Father    Hypertension Brother    Hyperlipidemia Brother    Other Brother        stent    Renal Disease Maternal Grandmother    Stroke Maternal Grandmother    Heart attack Maternal Grandfather    Cancer Paternal Grandmother    Heart failure Paternal Grandfather    Emphysema Paternal Grandfather    Bronchiolitis Paternal Grandfather    BP 94/60   Pulse 95   Wt 83.2 kg (183 lb 6.4 oz)   SpO2 96%   BMI 26.32 kg/m   Wt Readings from Last 3 Encounters:  09/17/21 83.2 kg (183 lb 6.4 oz)  08/28/21 85.6 kg (188 lb 12.8 oz)  08/07/21 87.5 kg (193 lb)   PHYSICAL EXAM: General: NAD Neck: JVP 8-9 cm with HJR, no thyromegaly or thyroid  nodule.  Lungs: Clear to auscultation bilaterally with normal respiratory effort. CV: Nondisplaced PMI.  Heart irregular S1/S2, no S3/S4, no murmur.  No peripheral edema.  No carotid bruit.  Normal pedal pulses.  Abdomen: Soft, nontender, no hepatosplenomegaly, no distention.  Skin: Intact without lesions or rashes.  Neurologic: Alert and oriented x 3.  Psych: Normal affect. Extremities: No clubbing or cyanosis.  HEENT: Normal.   ASSESSMENT & PLAN: 1. Chronic HF with mid range EF: Ischemic cardiomyopathy.  Echo in 8/22 with EF 45-50%, moderate LVH, RV normal, PASP 55 mmHg. On echo, there is some septal bounce, so post-CABG constrictive pericarditis would be a concern.  Also concern for possible cardiac amyloidosis with LVH. PYP scan not suggestive of TTR cardiac amyloidosis. 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  On exam today, he is mildly volume overloaded.  PADP by Cardiomems is 22 today which is below his goal but elevated. I think he likely needs a lower Cardiomems goal.  REDS clip higher than I would expect at 57% but was repeated to get a similar number. - Increase torsemide to 80 qam/60 qpm.  Continue KCl replacement.  BMET/BNP today and again in 10 days.  - Continue Farxiga 10 mg daily.   2. CKD: Stage 3b. 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  BMET today. - Follows with Dr. Moshe Cipro. - On tacrolimus and prednisone.  3. VT: Patient has history of VT, maintained on amiodarone. Has refused ICD in the past.   - With amiodarone, follow LFTs, TSH, and eye exam.  4. Atrial fibrillation: Permanent x years.  Rate controlled. Unlikely to be able to regain NSR for any significant period of time, would  not recommend ablation.  - Continue warfarin.   5. CAD: s/p CABG 1992.  Last LHC in 2019 in setting of VT with patent LIMA to LAD, patent SVG to PDA,  moderate stenosis SVG to ramus, occluded SVG to OM1 (medical therapy suggested due to comorbidities and thrombocytopenia with need for anticoagulation).  No chest pain.   - Increase atorvastatin to 40 mg daily for goal LDL < 70.  - On warfarin so ASA not needed.  6. S/p liver transplant: Followed at [facility].  7. Nocturnal hypoxemia: O2 desaturations noted while sleeping inpatient - Arrange for home sleep study.    Follow up 1 month with APP.   Loralie Champagne, MD 09/19/21

## 2021-09-22 ENCOUNTER — Other Ambulatory Visit (HOSPITAL_COMMUNITY): Payer: Self-pay

## 2021-09-22 ENCOUNTER — Telehealth (HOSPITAL_COMMUNITY): Payer: Self-pay | Admitting: *Deleted

## 2021-09-22 MED ORDER — AMIODARONE HCL 200 MG PO TABS
200.0000 mg | ORAL_TABLET | Freq: Every day | ORAL | 11 refills | Status: DC
Start: 2021-09-22 — End: 2021-10-05

## 2021-09-22 MED ORDER — TORSEMIDE 20 MG PO TABS: 80.0000 mg | ORAL_TABLET | Freq: Two times a day (BID) | ORAL | 6 refills | Status: DC

## 2021-09-22 NOTE — Telephone Encounter (Signed)
Threshold: 18  Reading: 25  Instructions: per Darrick Grinder, NP increase Torsemide to 80 mg Twice daily    Called and spoke w/pt, he is aware, agreeable, and verbalized understanding, med list updated

## 2021-09-22 NOTE — Telephone Encounter (Signed)
Meds ordered this encounter  Medications   amiodarone (PACERONE) 200 MG tablet    Sig: Take 1 tablet (200 mg total) by mouth daily.    Dispense:  30 tablet    Refill:  11

## 2021-09-23 ENCOUNTER — Ambulatory Visit (INDEPENDENT_AMBULATORY_CARE_PROVIDER_SITE_OTHER): Payer: Medicare Other | Admitting: *Deleted

## 2021-09-23 DIAGNOSIS — Z7901 Long term (current) use of anticoagulants: Secondary | ICD-10-CM

## 2021-09-23 DIAGNOSIS — I4821 Permanent atrial fibrillation: Secondary | ICD-10-CM | POA: Diagnosis not present

## 2021-09-23 DIAGNOSIS — Z5181 Encounter for therapeutic drug level monitoring: Secondary | ICD-10-CM | POA: Diagnosis not present

## 2021-09-23 DIAGNOSIS — Z7189 Other specified counseling: Secondary | ICD-10-CM

## 2021-09-23 LAB — POCT INR: INR: 4.1 — AB (ref 2.0–3.0)

## 2021-09-23 NOTE — Patient Instructions (Signed)
Description   Spoke with pt, instructed to hold today's dose and tomorrow's dose and then continue on same dosage of Warfarin 2mg  daily except 4mg  on Tuesdays. Remain consistent with your green leafy vegetables. Recheck INR 1 week. Weekly Personal assistant for insurance purposes. [phone]

## 2021-09-24 ENCOUNTER — Telehealth (HOSPITAL_COMMUNITY): Payer: Self-pay | Admitting: Surgery

## 2021-09-24 ENCOUNTER — Inpatient Hospital Stay (HOSPITAL_COMMUNITY)
Admission: EM | Admit: 2021-09-24 | Discharge: 2021-09-28 | DRG: 193 | Disposition: A | Discharge status: Home / Self Care | Payer: Medicare Other | Attending: Internal Medicine | Admitting: Internal Medicine

## 2021-09-24 ENCOUNTER — Encounter (HOSPITAL_BASED_OUTPATIENT_CLINIC_OR_DEPARTMENT_OTHER): Payer: Medicare Other | Admitting: Cardiology

## 2021-09-24 ENCOUNTER — Other Ambulatory Visit: Payer: Self-pay

## 2021-09-24 ENCOUNTER — Emergency Department (HOSPITAL_COMMUNITY): Payer: Medicare Other

## 2021-09-24 DIAGNOSIS — Z20822 Contact with and (suspected) exposure to covid-19: Secondary | ICD-10-CM | POA: Diagnosis present

## 2021-09-24 DIAGNOSIS — I130 Hypertensive heart and chronic kidney disease with heart failure and stage 1 through stage 4 chronic kidney disease, or unspecified chronic kidney disease: Secondary | ICD-10-CM | POA: Diagnosis present

## 2021-09-24 DIAGNOSIS — E785 Hyperlipidemia, unspecified: Secondary | ICD-10-CM | POA: Diagnosis present

## 2021-09-24 DIAGNOSIS — I252 Old myocardial infarction: Secondary | ICD-10-CM

## 2021-09-24 DIAGNOSIS — R579 Shock, unspecified: Secondary | ICD-10-CM | POA: Diagnosis present

## 2021-09-24 DIAGNOSIS — R791 Abnormal coagulation profile: Secondary | ICD-10-CM | POA: Diagnosis present

## 2021-09-24 DIAGNOSIS — G4733 Obstructive sleep apnea (adult) (pediatric): Secondary | ICD-10-CM | POA: Diagnosis not present

## 2021-09-24 DIAGNOSIS — I255 Ischemic cardiomyopathy: Secondary | ICD-10-CM | POA: Diagnosis present

## 2021-09-24 DIAGNOSIS — E860 Dehydration: Secondary | ICD-10-CM | POA: Diagnosis present

## 2021-09-24 DIAGNOSIS — A419 Sepsis, unspecified organism: Principal | ICD-10-CM

## 2021-09-24 DIAGNOSIS — Z823 Family history of stroke: Secondary | ICD-10-CM

## 2021-09-24 DIAGNOSIS — Z951 Presence of aortocoronary bypass graft: Secondary | ICD-10-CM

## 2021-09-24 DIAGNOSIS — I5022 Chronic systolic (congestive) heart failure: Secondary | ICD-10-CM | POA: Diagnosis present

## 2021-09-24 DIAGNOSIS — J9601 Acute respiratory failure with hypoxia: Secondary | ICD-10-CM | POA: Diagnosis present

## 2021-09-24 DIAGNOSIS — R571 Hypovolemic shock: Secondary | ICD-10-CM | POA: Diagnosis present

## 2021-09-24 DIAGNOSIS — T8611 Kidney transplant rejection: Secondary | ICD-10-CM | POA: Diagnosis present

## 2021-09-24 DIAGNOSIS — D6959 Other secondary thrombocytopenia: Secondary | ICD-10-CM | POA: Diagnosis present

## 2021-09-24 DIAGNOSIS — I2510 Atherosclerotic heart disease of native coronary artery without angina pectoris: Secondary | ICD-10-CM | POA: Diagnosis present

## 2021-09-24 DIAGNOSIS — T8619 Other complication of kidney transplant: Secondary | ICD-10-CM | POA: Diagnosis present

## 2021-09-24 DIAGNOSIS — Z803 Family history of malignant neoplasm of breast: Secondary | ICD-10-CM

## 2021-09-24 DIAGNOSIS — I959 Hypotension, unspecified: Secondary | ICD-10-CM | POA: Diagnosis present

## 2021-09-24 DIAGNOSIS — J189 Pneumonia, unspecified organism: Secondary | ICD-10-CM | POA: Diagnosis not present

## 2021-09-24 DIAGNOSIS — I4891 Unspecified atrial fibrillation: Secondary | ICD-10-CM | POA: Diagnosis present

## 2021-09-24 DIAGNOSIS — Z7901 Long term (current) use of anticoagulants: Secondary | ICD-10-CM

## 2021-09-24 DIAGNOSIS — Z79899 Other long term (current) drug therapy: Secondary | ICD-10-CM

## 2021-09-24 DIAGNOSIS — I4821 Permanent atrial fibrillation: Secondary | ICD-10-CM | POA: Diagnosis present

## 2021-09-24 DIAGNOSIS — Z8051 Family history of malignant neoplasm of kidney: Secondary | ICD-10-CM

## 2021-09-24 DIAGNOSIS — N183 Chronic kidney disease, stage 3 unspecified: Secondary | ICD-10-CM | POA: Diagnosis present

## 2021-09-24 DIAGNOSIS — Z8249 Family history of ischemic heart disease and other diseases of the circulatory system: Secondary | ICD-10-CM

## 2021-09-24 DIAGNOSIS — Z7952 Long term (current) use of systemic steroids: Secondary | ICD-10-CM

## 2021-09-24 DIAGNOSIS — N179 Acute kidney failure, unspecified: Secondary | ICD-10-CM | POA: Diagnosis present

## 2021-09-24 DIAGNOSIS — Y830 Surgical operation with transplant of whole organ as the cause of abnormal reaction of the patient, or of later complication, without mention of misadventure at the time of the procedure: Secondary | ICD-10-CM | POA: Diagnosis present

## 2021-09-24 DIAGNOSIS — Z944 Liver transplant status: Secondary | ICD-10-CM

## 2021-09-24 LAB — COMPREHENSIVE METABOLIC PANEL
ALT: 19 U/L (ref 0–44)
AST: 23 U/L (ref 15–41)
Albumin: 2.9 g/dL — ABNORMAL LOW (ref 3.5–5.0)
Alkaline Phosphatase: 91 U/L (ref 38–126)
Anion gap: 13 (ref 5–15)
BUN: 23 mg/dL (ref 8–23)
CO2: 24 mmol/L (ref 22–32)
Calcium: 9.6 mg/dL (ref 8.9–10.3)
Chloride: 99 mmol/L (ref 98–111)
Creatinine, Ser: 2.58 mg/dL — ABNORMAL HIGH (ref 0.61–1.24)
GFR, Estimated: 26 mL/min — ABNORMAL LOW (ref >60)
Glucose, Bld: 107 mg/dL — ABNORMAL HIGH (ref 70–99)
Potassium: 4.2 mmol/L (ref 3.5–5.1)
Sodium: 136 mmol/L (ref 135–145)
Total Bilirubin: 1.9 mg/dL — ABNORMAL HIGH (ref 0.3–1.2)
Total Protein: 6.9 g/dL (ref 6.5–8.1)

## 2021-09-24 LAB — URINALYSIS, ROUTINE W REFLEX MICROSCOPIC
Bacteria, UA: NONE SEEN
Bilirubin Urine: NEGATIVE
Glucose, UA: >=500 mg/dL — AB
Ketones, ur: NEGATIVE mg/dL
Leukocytes,Ua: NEGATIVE
Nitrite: NEGATIVE
Protein, ur: NEGATIVE mg/dL
Specific Gravity, Urine: 1.007 (ref 1.005–1.030)
pH: 7 (ref 5.0–8.0)

## 2021-09-24 LAB — CBG MONITORING, ED: Glucose-Capillary: 102 mg/dL — ABNORMAL HIGH (ref 70–99)

## 2021-09-24 LAB — CBC WITH DIFFERENTIAL/PLATELET
Abs Immature Granulocytes: 0.09 10*3/uL — ABNORMAL HIGH (ref 0.00–0.07)
Basophils Absolute: 0 10*3/uL (ref 0.0–0.1)
Basophils Relative: 0 %
Eosinophils Absolute: 0.3 10*3/uL (ref 0.0–0.5)
Eosinophils Relative: 2 %
HCT: 50.4 % (ref 39.0–52.0)
Hemoglobin: 15.7 g/dL (ref 13.0–17.0)
Immature Granulocytes: 1 %
Lymphocytes Relative: 3 %
Lymphs Abs: 0.3 10*3/uL — ABNORMAL LOW (ref 0.7–4.0)
MCH: 25.7 pg — ABNORMAL LOW (ref 26.0–34.0)
MCHC: 31.2 g/dL (ref 30.0–36.0)
MCV: 82.5 fL (ref 80.0–100.0)
Monocytes Absolute: 0.5 10*3/uL (ref 0.1–1.0)
Monocytes Relative: 5 %
Neutro Abs: 10.6 10*3/uL — ABNORMAL HIGH (ref 1.7–7.7)
Neutrophils Relative %: 89 %
Platelets: 116 10*3/uL — ABNORMAL LOW (ref 150–400)
RBC: 6.11 MIL/uL — ABNORMAL HIGH (ref 4.22–5.81)
RDW: 16.3 % — ABNORMAL HIGH (ref 11.5–15.5)
WBC: 11.8 10*3/uL — ABNORMAL HIGH (ref 4.0–10.5)
nRBC: 0 % (ref 0.0–0.2)

## 2021-09-24 LAB — RESP PANEL BY RT-PCR (FLU A&B, COVID) ARPGX2
Influenza A by PCR: NEGATIVE
Influenza B by PCR: NEGATIVE
SARS Coronavirus 2 by RT PCR: NEGATIVE

## 2021-09-24 LAB — TROPONIN I (HIGH SENSITIVITY): Troponin I (High Sensitivity): 21 ng/L — ABNORMAL HIGH (ref <18)

## 2021-09-24 LAB — PROTIME-INR
INR: 4.5 (ref 0.8–1.2)
Prothrombin Time: 42.6 seconds — ABNORMAL HIGH (ref 11.4–15.2)

## 2021-09-24 LAB — LIPASE, BLOOD: Lipase: 29 U/L (ref 11–51)

## 2021-09-24 LAB — MAGNESIUM: Magnesium: 2.1 mg/dL (ref 1.7–2.4)

## 2021-09-24 LAB — LACTIC ACID, PLASMA: Lactic Acid, Venous: 2.3 mmol/L (ref 0.5–1.9)

## 2021-09-24 LAB — TSH: TSH: 1.23 u[IU]/mL (ref 0.350–4.500)

## 2021-09-24 MED ORDER — SODIUM CHLORIDE 0.9 % IV SOLN
500.0000 mg | INTRAVENOUS | Status: DC
  Administered 2021-09-24 – 2021-09-27 (×4): 500 mg via INTRAVENOUS
  Filled 2021-09-24 (×4): qty 500

## 2021-09-24 MED ORDER — LACTATED RINGERS IV SOLN: INTRAVENOUS | Status: DC

## 2021-09-24 MED ORDER — LACTATED RINGERS IV BOLUS (SEPSIS)
1000.0000 mL | Freq: Once | INTRAVENOUS | Status: AC
  Administered 2021-09-24: 23:00:00 1000 mL via INTRAVENOUS

## 2021-09-24 MED ORDER — LACTATED RINGERS IV BOLUS (SEPSIS): 500.0000 mL | Freq: Once | INTRAVENOUS | Status: DC

## 2021-09-24 MED ORDER — SODIUM CHLORIDE 0.9 % IV BOLUS: 500.0000 mL | Freq: Once | INTRAVENOUS | Status: DC

## 2021-09-24 MED ORDER — LACTATED RINGERS IV BOLUS (SEPSIS)
1000.0000 mL | Freq: Once | INTRAVENOUS | Status: AC
  Administered 2021-09-24: 22:00:00 1000 mL via INTRAVENOUS

## 2021-09-24 MED ORDER — SODIUM CHLORIDE 0.9 % IV SOLN
2.0000 g | INTRAVENOUS | Status: DC
  Administered 2021-09-24 – 2021-09-27 (×4): 2 g via INTRAVENOUS
  Filled 2021-09-24 (×4): qty 20

## 2021-09-24 NOTE — ED Notes (Signed)
EDP aware of Critical lactid acid

## 2021-09-24 NOTE — ED Notes (Signed)
IV attempted x3 by this RN and Stefani Dama - MD [facility] at bedside to attempt ultrasound IV

## 2021-09-24 NOTE — Telephone Encounter (Signed)
Patient called requesting information regarding his Amiodarone which he tells me that he requested refill for several days ago from [facility].  I told him that the prescription was sent to [facility] in [place] on 09/22/21.  He says that is fine and he will go there to pick it up today.

## 2021-09-24 NOTE — ED Notes (Signed)
Lab agreed to add on APTT

## 2021-09-24 NOTE — Progress Notes (Signed)
Elink following Code Sepsis. 

## 2021-09-24 NOTE — Telephone Encounter (Signed)
I called patient to remind him to perform the ordered home sleep study.  He tells me that he completed it last night.  I will check the website to ensure that the study uploaded.  He was grateful for the call and will await results information from interpreting provider office.

## 2021-09-24 NOTE — ED Provider Notes (Signed)
[facility] EMERGENCY DEPARTMENT Provider Note   CSN: [id number] Arrival date & time: 09/24/21  2028     History Chief Complaint  Patient presents with   Atrial Fibrillation    W/ RVR     [name] is a 70 y.o. male.   Atrial Fibrillation Pertinent negatives include no chest pain, no abdominal pain and no shortness of breath.  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  Patient reports that at approximately noon today, he developed sudden onset shaking chills, nausea, and diaphoresis.  He did not check his temperature at home.  He denies any other associated symptoms, such as dizziness or lightheadedness, syncope, chest pain, shortness of breath or cough, abdominal pain, vomiting or diarrhea, or any other concerns.  He denies any known sick contacts, and states he does not know whether or not he is COVID-19 or influenza vaccinated.  He does not wear oxygen at baseline.  He is currently taking prednisone and Prograf.  Past Medical History:  Diagnosis Date   Atrial fibrillation ([place]) 08/2009   CAD (coronary artery disease)    s/p CABG -- 1992   ESRD (end stage renal disease) ([place])    HLD (hyperlipidemia)    HTN (hypertension)    Idiopathic thrombocytopenia (HCC)     Patient Active Problem List   Diagnosis Date Noted   Osteopenia 07/17/2021    Class: Chronic   B12 deficiency anemia 07/17/2021    Class: Chronic   Acute on chronic systolic CHF (congestive heart failure) ([place]) 06/23/2021   CAP (community acquired pneumonia) 08/09/2018   Ventricular tachycardia 08/05/2018   Chest pain 12/17/2016   Gout 12/17/2016   History of ITP 60/63/0160   Chronic systolic CHF (congestive heart failure) ([place]) 12/17/2016   Sepsis ([place]) 12/17/2016   History of liver  transplant ([place]) 12/17/2016   History of kidney transplant 12/17/2016   Acute respiratory failure with hypoxia ([place]) 12/17/2016   CKD (chronic kidney disease), stage III ([place]) 12/17/2016   HTN (hypertension)    HLD (hyperlipidemia)    CAD (coronary artery disease)    Long term (current) use of anticoagulants [Z79.01] 11/07/2015   Goals of care, counseling/discussion 11/07/2015   Monoclonal gammopathy of unknown significance (MGUS) 07/17/2010    Class: Chronic   Atrial fibrillation ([place]) 08/08/2009   Chronic ITP (idiopathic thrombocytopenic purpura) (HCC) 10/16/1998    Class: Chronic    Past Surgical History:  Procedure Laterality Date   APPENDECTOMY     CORONARY ARTERY BYPASS GRAFT     FACIAL COSMETIC SURGERY     GALLBLADDER SURGERY     HERNIA REPAIR     KIDNEY TRANSPLANT     LEFT HEART CATH AND CORONARY ANGIOGRAPHY N/A 08/07/2018   Procedure: LEFT HEART CATH AND CORONARY ANGIOGRAPHY;  Surgeon: Sherren Mocha, MD;  Location: [facility];  Service: Cardiovascular;  Laterality: N/A;   LIVER TRANSPLANT     PRESSURE SENSOR/CARDIOMEMS N/A 08/04/2021   Procedure: PRESSURE SENSOR/CARDIOMEMS;  Surgeon: Larey Dresser, MD;  Location: [facility];  Service: Cardiovascular;  Laterality: N/A;   RIGHT HEART CATH N/A 06/25/2021   Procedure: RIGHT HEART CATH;  Surgeon: Larey Dresser, MD;  Location: [facility];  Service: Cardiovascular;  Laterality: N/A;       Family History  Problem Relation  Age of Onset   Atrial fibrillation Mother    Breast cancer Mother    Renal cancer Father    Hypertension Brother    Hyperlipidemia Brother    Other Brother        stent    Renal Disease Maternal Grandmother    Stroke Maternal Grandmother    Heart attack Maternal Grandfather    Cancer Paternal Grandmother    Heart failure Paternal Grandfather    Emphysema Paternal Grandfather    Bronchiolitis Paternal Grandfather     Social History   Tobacco Use   Smoking status:  Never   Smokeless tobacco: Never  Substance Use Topics   Alcohol use: No   Drug use: No    Home Medications Prior to Admission medications   Medication Sig Start Date End Date Taking? Authorizing Provider  allopurinol (ZYLOPRIM) 100 MG tablet Take 100 mg by mouth daily.    [provider]  amiodarone (PACERONE) 200 MG tablet Take 1 tablet (200 mg total) by mouth daily. 09/22/21   Larey Dresser, MD  atorvastatin (LIPITOR) 40 MG tablet Take 1 tablet (40 mg total) by mouth at bedtime. 09/17/21   Larey Dresser, MD  calcitRIOL (ROCALTROL) 0.25 MCG capsule Take 0.25 mcg by mouth daily.    [provider]  Cholecalciferol (VITAMIN D3) 50 MCG (2000 UT) TABS Take 2,000 Units by mouth daily.    [provider]  Cyanocobalamin (VITAMIN B12) 1000 MCG TBCR Take 1,000 mcg by mouth daily.    [provider]  dapagliflozin propanediol (FARXIGA) 10 MG TABS tablet Take 1 tablet (10 mg total) by mouth daily. 07/03/21   Larey Dresser, MD  DULoxetine (CYMBALTA) 60 MG capsule Take 60 mg by mouth at bedtime.    [provider]  EPINEPHrine (EPI-PEN) 0.3 mg/0.3 mL DEVI Inject 0.3 mg into the muscle once as needed (severe allergic reaction).    [provider]  ferrous sulfate 325 (65 FE) MG tablet Take 325 mg by mouth daily with breakfast.    [provider]  fish oil-omega-3 fatty acids 1000 MG capsule Take 1 g by mouth 2 (two) times daily.    [provider]  MAGNESIUM-OXIDE 400 (240 Mg) MG tablet Take 400 mg by mouth 2 (two) times daily. 05/04/21   [provider]  melatonin 5 MG TABS Take 5 mg by mouth at bedtime.    [provider]  nitroGLYCERIN (NITROSTAT) 0.4 MG SL tablet Place 0.4 mg under the tongue every 5 (five) minutes as needed for chest pain.    [provider]  potassium chloride SA (KLOR-CON) 20 MEQ tablet Take 2 tablets (40 mEq total) by mouth 3 (three) times daily. 08/04/21   Larey Dresser,  MD  predniSONE (DELTASONE) 5 MG tablet Take 1 tablet (5 mg total) by mouth daily. 12/29/16   [name], MD  QUEtiapine (SEROQUEL) 50 MG tablet Take 150 mg by mouth at bedtime. 08/13/19   [provider]  tacrolimus (PROGRAF) 0.5 MG capsule Take 0.5 mg by mouth See admin instructions. Takes 0.5 mg  tablet by mouth one day and 1 mg tablets next day (1 and 2 tablets on alternate days)    [provider]  torsemide (DEMADEX) 20 MG tablet Take 4 tablets (80 mg total) by mouth 2 (two) times daily. 09/22/21   Clegg, Amy D, NP  warfarin (COUMADIN) 4 MG tablet TAKE AS DIRECTED BY COUMADIN CLINIC Patient taking differently: Take 2-4 mg by  mouth See admin instructions. Takes 4 mg on Tuesdays and 2 mg on all other days 09/19/20   Deboraha Sprang, MD  zolpidem (AMBIEN) 5 MG tablet Take 5 mg by mouth at bedtime as needed for sleep. 07/03/21   [provider]    Allergies    Other, Grapefruit concentrate, Haloperidol, Nsaids, Rifampin, and Triazolam  Review of Systems   Review of Systems  Constitutional:  Positive for appetite change, chills and diaphoresis. Negative for activity change, fatigue and fever.  HENT:  Negative for sore throat and trouble swallowing.   Eyes:  Negative for pain and visual disturbance.  Respiratory:  Negative for cough and shortness of breath.   Cardiovascular:  Negative for chest pain and palpitations.  Gastrointestinal:  Negative for abdominal distention, abdominal pain, blood in stool, constipation, diarrhea, nausea and vomiting.  Genitourinary:  Negative for dysuria and hematuria.  Musculoskeletal:  Negative for arthralgias, back pain, neck pain and neck stiffness.  Skin:  Negative for color change, pallor and rash.  Allergic/Immunologic: Positive for immunocompromised state.  Neurological:  Negative for dizziness, seizures, syncope, weakness, light-headedness and numbness.  Psychiatric/Behavioral:  Negative for confusion. The patient is not  nervous/anxious.   All other systems reviewed and are negative.  Physical Exam Updated Vital Signs BP (!) 85/64   Pulse (!) 124   Temp (!) 102.5 F (39.2 C) (Rectal)   Resp 19   Ht 5\' 10"  (1.778 m)   Wt 79.4 kg   SpO2 99%   BMI 25.11 kg/m   Physical Exam Vitals and nursing note reviewed.  Constitutional:      General: He is not in acute distress.    Appearance: He is well-developed and normal weight. He is ill-appearing and diaphoretic.  HENT:     Head: Normocephalic and atraumatic.     Right Ear: External ear normal.     Left Ear: External ear normal.     Nose: Nose normal.     Mouth/Throat:     Mouth: Mucous membranes are dry.     Pharynx: Oropharynx is clear.  Eyes:     General: No scleral icterus.    Extraocular Movements: Extraocular movements intact.     Conjunctiva/sclera: Conjunctivae normal.     Pupils: Pupils are equal, round, and reactive to light.  Cardiovascular:     Rate and Rhythm: Tachycardia present. Rhythm irregular.     Pulses: Normal pulses.     Heart sounds: Normal heart sounds. No murmur heard. Pulmonary:     Effort: Pulmonary effort is normal. No tachypnea, bradypnea, accessory muscle usage, prolonged expiration, respiratory distress or retractions.     Breath sounds: Examination of the left-middle field reveals decreased breath sounds. Examination of the left-lower field reveals decreased breath sounds. Decreased breath sounds present.  Abdominal:     General: There is no distension.     Palpations: Abdomen is soft.     Tenderness: There is no abdominal tenderness. There is no guarding or rebound.  Musculoskeletal:        General: No swelling. Normal range of motion.     Cervical back: Normal range of motion and neck supple. No rigidity or tenderness.     Right lower leg: No edema.     Left lower leg: No edema.  Lymphadenopathy:     Cervical: No cervical adenopathy.  Skin:    General: Skin is warm.     Capillary Refill: Capillary refill  takes less than 2 seconds.  Findings: No rash.  Neurological:     General: No focal deficit present.     Mental Status: He is alert and oriented to person, place, and time. Mental status is at baseline.     GCS: GCS eye subscore is 4. GCS verbal subscore is 5. GCS motor subscore is 6.  Psychiatric:        Mood and Affect: Mood normal.        Behavior: Behavior normal.    ED Results / Procedures / Treatments   Labs (all labs ordered are listed, but only abnormal results are displayed) Labs Reviewed  COMPREHENSIVE METABOLIC PANEL - Abnormal; Notable for the following components:      Result Value   Glucose, Bld 107 (*)    Creatinine, Ser 2.58 (*)    Albumin 2.9 (*)    Total Bilirubin 1.9 (*)    GFR, Estimated 26 (*)    All other components within normal limits  CBC WITH DIFFERENTIAL/PLATELET - Abnormal; Notable for the following components:   WBC 11.8 (*)    RBC 6.11 (*)    MCH 25.7 (*)    RDW 16.3 (*)    Platelets 116 (*)    Neutro Abs 10.6 (*)    Lymphs Abs 0.3 (*)    Abs Immature Granulocytes 0.09 (*)    All other components within normal limits  PROTIME-INR - Abnormal; Notable for the following components:   Prothrombin Time 42.6 (*)    INR 4.5 (*)    All other components within normal limits  LACTIC ACID, PLASMA - Abnormal; Notable for the following components:   Lactic Acid, Venous 2.3 (*)    All other components within normal limits  URINALYSIS, ROUTINE W REFLEX MICROSCOPIC - Abnormal; Notable for the following components:   Glucose, UA >=500 (*)    Hgb urine dipstick SMALL (*)    All other components within normal limits  CBG MONITORING, ED - Abnormal; Notable for the following components:   Glucose-Capillary 102 (*)    All other components within normal limits  TROPONIN I (HIGH SENSITIVITY) - Abnormal; Notable for the following components:   Troponin I (High Sensitivity) 21 (*)    All other components within normal limits  CULTURE, BLOOD (ROUTINE X 2)   CULTURE, BLOOD (ROUTINE X 2)  URINE CULTURE  RESP PANEL BY RT-PCR (FLU A&B, COVID) ARPGX2  LIPASE, BLOOD  MAGNESIUM  TSH  LACTIC ACID, PLASMA  APTT  TACROLIMUS LEVEL  TROPONIN I (HIGH SENSITIVITY)    EKG EKG Interpretation  Date/Time:  Thursday September 24 2021 20:37:31 EST Ventricular Rate:  119 PR Interval:    QRS Duration: 133 QT Interval:  347 QTC Calculation: 489 R Axis:   98 Text Interpretation: Atrial fibrillation Nonspecific intraventricular conduction delay Anterolateral infarct, age indeterminate Confirmed by Nanda Quinton ([id number]) on 09/24/2021 10:10:34 PM  Radiology DG Chest Portable 1 View  Result Date: 09/24/2021 CLINICAL DATA:  Fever. EXAM: PORTABLE CHEST 1 VIEW COMPARISON:  September 17, 2021 FINDINGS: Multiple sternal wires are noted. Stable, diffusely increased interstitial lung markings are noted. There is stable elevation of the right hemidiaphragm. Mild linear atelectasis is seen within the mid right lung. There is no evidence of a pleural effusion or pneumothorax. The cardiac silhouette is enlarged and unchanged in size. The visualized skeletal structures are unremarkable. IMPRESSION: 1. Increased interstitial lung markings which are likely chronic in nature. A superimposed component of interstitial edema cannot be excluded. 2. Stable cardiomegaly with mild mid right lung linear  atelectasis. Electronically Signed   By: Virgina Norfolk M.D.   On: 09/24/2021 22:09    Procedures Procedures   Medications Ordered in ED Medications  sodium chloride 0.9 % bolus 500 mL (500 mLs Intravenous Not Given 09/24/21 2137)  lactated ringers infusion ( Intravenous New Bag/Given 09/24/21 2309)  lactated ringers bolus 1,000 mL (has no administration in time range)    And  lactated ringers bolus 500 mL (has no administration in time range)  cefTRIAXone (ROCEPHIN) 2 g in sodium chloride 0.9 % 100 mL IVPB (0 g Intravenous Stopped 09/24/21 2309)  azithromycin (ZITHROMAX) 500  mg in sodium chloride 0.9 % 250 mL IVPB (500 mg Intravenous New Bag/Given 09/24/21 2249)  lactated ringers bolus 1,000 mL (0 mLs Intravenous Stopped 09/24/21 2309)    ED Course  I have reviewed the triage vital signs and the nursing notes.  Pertinent labs & imaging results that were available during my care of the patient were reviewed by me and considered in my medical decision making (see chart for details).    MDM Rules/Calculators/A&P                          Wendall Isabell is a 70 y.o. male presenting with fever. Initial VS sig for hypotension and tachycardia.  Patient hypoxic on room air to 86%, placed on 4 L [place].  BGL 102.  EKG interpretation: Atrial fibrillation with RVR, rate 119 bpm, Q waves in inferior leads, nonspecific IV conduction delay. No ST elevations or depressions.  Similar to prior EKGs, rate faster than prior.  Labs: PT and INR elevated.  Lipase 29, TSH WNL.  Lactic acidosis of 2.3 with neutrophilia and left shift.  Repeat lactic acid 1.7 after fluid resuscitation.  Thrombocytopenia near prior baseline.  Creatinine elevated near prior baseline with elevated T bili.  Initial troponin of 21.  UA with small hematuria and glucosuria, otherwise WNL.  Initial troponin of 21, repeat 23.  Tacrolimus level pending.  Blood cultures and urine culture obtained.  COVID-19/flu negative.  Imaging: CXR with increased interstitial lung markings and stable elevated right hemidiaphragm. Imaging was reviewed by radiology and personally by me.  DDX considered: CAP, pneumothorax, cellulitis, UTI, ACS, pyelonephritis. History, examination, and objective data most consistent with sepsis, likely as patient is immunosuppressed at baseline.  Unclear etiology, suspect pulmonary source as patient has acute hypoxic respiratory failure requiring oxygen supplementation.  Denies any urinary symptoms and UA inconsistent with infectious process.  Patient denies chest pain, suspect elevated troponin  secondary to demand ischemia given hypotension.  Patient does not report flank pain or any concerns for pyelonephritis at this time given renal transplants, but would be unable to obtain CT imaging with contrast either way.  No focal lobar opacity, but does have decreased right-sided breath sounds and may have a developing pneumonia in this region.  No signs of skin infection or other source identified.  Suspect atrial fibrillation secondary to septic process, will not rate control at this time and will focus on resuscitation first.  No mental status changes.  Medications: Medications  sodium chloride 0.9 % bolus 500 mL (500 mLs Intravenous Not Given 09/24/21 2137)  lactated ringers infusion ( Intravenous New Bag/Given 09/24/21 2309)  lactated ringers bolus 1,000 mL (has no administration in time range)    And  lactated ringers bolus 500 mL (has no administration in time range)  cefTRIAXone (ROCEPHIN) 2 g in sodium chloride 0.9 % 100 mL IVPB (  0 g Intravenous Stopped 09/24/21 2309)  azithromycin (ZITHROMAX) 500 mg in sodium chloride 0.9 % 250 mL IVPB (500 mg Intravenous New Bag/Given 09/24/21 2249)  lactated ringers bolus 1,000 mL (0 mLs Intravenous Stopped 09/24/21 2309)    Critical care consulted.  Patient remains persistently hypotensive despite fluid resuscitation, Levophed drip initiated.  Antibiotic coverage for presumed pulmonary etiology initiated per sepsis protocol.  Re-evaluated prior to admission.  Resting in no acute distress, BP improving after pressors.  Developed fever in ED to 102.5 F.  Oncoming provider to follow-up on pending critical care recommendations.  Anticipate admission, final disposition pending.  Patient understands and agrees with the plan.   Transfer of Care Note I transferred care of [name] at the end of my shift.  I discussed the patient's presentation and critical findings with the oncoming team.  Please see their documentation for the remainder of  care.  I answered the incoming team's questions to the best of my ability.   The plan for this patient was discussed with my attending physician, who voiced agreement and who oversaw evaluation and treatment of this patient.     Note: Estate manager/land agent was used in the creation of this note.  Final Clinical Impression(s) / ED Diagnoses Final diagnoses:  Sepsis, due to unspecified organism, unspecified whether acute organ dysfunction present [facility])    Rx / [place] Orders ED Discharge Orders     None        Cherly Hensen, DO 09/25/21 0109    Margette Fast, MD 09/26/21 2349

## 2021-09-24 NOTE — ED Triage Notes (Signed)
Pt BIB EMS for Afib RVR. Pt reported feeling sick since 2pm this afternoon with a fever "that just wont break" and some difficulty breathing. Pt reports running out of his amiodarone about 2-3 days ago  HX Afib  90% RA    95% 4L 103 F oral  85/56

## 2021-09-24 NOTE — ED Notes (Signed)
Lab to add on Tacrolimus level

## 2021-09-25 ENCOUNTER — Inpatient Hospital Stay (HOSPITAL_COMMUNITY): Payer: Medicare Other

## 2021-09-25 ENCOUNTER — Telehealth (HOSPITAL_COMMUNITY): Payer: Self-pay | Admitting: Surgery

## 2021-09-25 ENCOUNTER — Encounter (HOSPITAL_COMMUNITY): Payer: Self-pay

## 2021-09-25 DIAGNOSIS — D6959 Other secondary thrombocytopenia: Secondary | ICD-10-CM | POA: Diagnosis present

## 2021-09-25 DIAGNOSIS — E785 Hyperlipidemia, unspecified: Secondary | ICD-10-CM | POA: Diagnosis present

## 2021-09-25 DIAGNOSIS — Z7901 Long term (current) use of anticoagulants: Secondary | ICD-10-CM | POA: Diagnosis not present

## 2021-09-25 DIAGNOSIS — T8619 Other complication of kidney transplant: Secondary | ICD-10-CM | POA: Diagnosis present

## 2021-09-25 DIAGNOSIS — I255 Ischemic cardiomyopathy: Secondary | ICD-10-CM | POA: Diagnosis present

## 2021-09-25 DIAGNOSIS — I5023 Acute on chronic systolic (congestive) heart failure: Secondary | ICD-10-CM

## 2021-09-25 DIAGNOSIS — A419 Sepsis, unspecified organism: Secondary | ICD-10-CM | POA: Diagnosis not present

## 2021-09-25 DIAGNOSIS — Z8249 Family history of ischemic heart disease and other diseases of the circulatory system: Secondary | ICD-10-CM | POA: Diagnosis not present

## 2021-09-25 DIAGNOSIS — R571 Hypovolemic shock: Secondary | ICD-10-CM | POA: Diagnosis present

## 2021-09-25 DIAGNOSIS — I9589 Other hypotension: Secondary | ICD-10-CM

## 2021-09-25 DIAGNOSIS — Z20822 Contact with and (suspected) exposure to covid-19: Secondary | ICD-10-CM | POA: Diagnosis present

## 2021-09-25 DIAGNOSIS — Z8051 Family history of malignant neoplasm of kidney: Secondary | ICD-10-CM | POA: Diagnosis not present

## 2021-09-25 DIAGNOSIS — R791 Abnormal coagulation profile: Secondary | ICD-10-CM | POA: Diagnosis present

## 2021-09-25 DIAGNOSIS — R579 Shock, unspecified: Secondary | ICD-10-CM | POA: Diagnosis present

## 2021-09-25 DIAGNOSIS — I5032 Chronic diastolic (congestive) heart failure: Secondary | ICD-10-CM | POA: Diagnosis not present

## 2021-09-25 DIAGNOSIS — I130 Hypertensive heart and chronic kidney disease with heart failure and stage 1 through stage 4 chronic kidney disease, or unspecified chronic kidney disease: Secondary | ICD-10-CM | POA: Diagnosis present

## 2021-09-25 DIAGNOSIS — I4821 Permanent atrial fibrillation: Secondary | ICD-10-CM | POA: Diagnosis present

## 2021-09-25 DIAGNOSIS — Y830 Surgical operation with transplant of whole organ as the cause of abnormal reaction of the patient, or of later complication, without mention of misadventure at the time of the procedure: Secondary | ICD-10-CM | POA: Diagnosis present

## 2021-09-25 DIAGNOSIS — I5022 Chronic systolic (congestive) heart failure: Secondary | ICD-10-CM | POA: Diagnosis present

## 2021-09-25 DIAGNOSIS — I959 Hypotension, unspecified: Secondary | ICD-10-CM | POA: Diagnosis not present

## 2021-09-25 DIAGNOSIS — N170 Acute kidney failure with tubular necrosis: Secondary | ICD-10-CM | POA: Diagnosis not present

## 2021-09-25 DIAGNOSIS — Z803 Family history of malignant neoplasm of breast: Secondary | ICD-10-CM | POA: Diagnosis not present

## 2021-09-25 DIAGNOSIS — E860 Dehydration: Secondary | ICD-10-CM | POA: Diagnosis present

## 2021-09-25 DIAGNOSIS — N183 Chronic kidney disease, stage 3 unspecified: Secondary | ICD-10-CM | POA: Diagnosis present

## 2021-09-25 DIAGNOSIS — Z951 Presence of aortocoronary bypass graft: Secondary | ICD-10-CM | POA: Diagnosis not present

## 2021-09-25 DIAGNOSIS — Z7952 Long term (current) use of systemic steroids: Secondary | ICD-10-CM | POA: Diagnosis not present

## 2021-09-25 DIAGNOSIS — Z944 Liver transplant status: Secondary | ICD-10-CM | POA: Diagnosis not present

## 2021-09-25 DIAGNOSIS — T8611 Kidney transplant rejection: Secondary | ICD-10-CM | POA: Diagnosis not present

## 2021-09-25 DIAGNOSIS — Z823 Family history of stroke: Secondary | ICD-10-CM | POA: Diagnosis not present

## 2021-09-25 DIAGNOSIS — J189 Pneumonia, unspecified organism: Secondary | ICD-10-CM | POA: Diagnosis present

## 2021-09-25 DIAGNOSIS — N179 Acute kidney failure, unspecified: Secondary | ICD-10-CM

## 2021-09-25 DIAGNOSIS — Z79899 Other long term (current) drug therapy: Secondary | ICD-10-CM | POA: Diagnosis not present

## 2021-09-25 DIAGNOSIS — J9601 Acute respiratory failure with hypoxia: Secondary | ICD-10-CM | POA: Diagnosis present

## 2021-09-25 LAB — CBC
HCT: 47.1 % (ref 39.0–52.0)
Hemoglobin: 14 g/dL (ref 13.0–17.0)
MCH: 25.2 pg — ABNORMAL LOW (ref 26.0–34.0)
MCHC: 29.7 g/dL — ABNORMAL LOW (ref 30.0–36.0)
MCV: 84.9 fL (ref 80.0–100.0)
Platelets: 98 10*3/uL — ABNORMAL LOW (ref 150–400)
RBC: 5.55 MIL/uL (ref 4.22–5.81)
RDW: 16.3 % — ABNORMAL HIGH (ref 11.5–15.5)
WBC: 11.7 10*3/uL — ABNORMAL HIGH (ref 4.0–10.5)
nRBC: 0 % (ref 0.0–0.2)

## 2021-09-25 LAB — TROPONIN I (HIGH SENSITIVITY): Troponin I (High Sensitivity): 23 ng/L — ABNORMAL HIGH (ref <18)

## 2021-09-25 LAB — ECHOCARDIOGRAM COMPLETE
AR max vel: 2.11 cm2
AV Area VTI: 2.13 cm2
AV Area mean vel: 2.11 cm2
AV Mean grad: 6 mmHg
AV Peak grad: 11.9 mmHg
Ao pk vel: 1.72 m/s
Area-P 1/2: 4.2 cm2
Height: 70 in
S' Lateral: 4 cm
Weight: 2800 oz

## 2021-09-25 LAB — BASIC METABOLIC PANEL
Anion gap: 9 (ref 5–15)
BUN: 24 mg/dL — ABNORMAL HIGH (ref 8–23)
CO2: 24 mmol/L (ref 22–32)
Calcium: 8.7 mg/dL — ABNORMAL LOW (ref 8.9–10.3)
Chloride: 100 mmol/L (ref 98–111)
Creatinine, Ser: 2.47 mg/dL — ABNORMAL HIGH (ref 0.61–1.24)
GFR, Estimated: 28 mL/min — ABNORMAL LOW (ref >60)
Glucose, Bld: 121 mg/dL — ABNORMAL HIGH (ref 70–99)
Potassium: 4.8 mmol/L (ref 3.5–5.1)
Sodium: 133 mmol/L — ABNORMAL LOW (ref 135–145)

## 2021-09-25 LAB — STREP PNEUMONIAE URINARY ANTIGEN: Strep Pneumo Urinary Antigen: NEGATIVE

## 2021-09-25 LAB — LACTIC ACID, PLASMA: Lactic Acid, Venous: 1.7 mmol/L (ref 0.5–1.9)

## 2021-09-25 LAB — MAGNESIUM: Magnesium: 2.1 mg/dL (ref 1.7–2.4)

## 2021-09-25 LAB — MRSA NEXT GEN BY PCR, NASAL: MRSA by PCR Next Gen: NOT DETECTED

## 2021-09-25 MED ORDER — NOREPINEPHRINE 4 MG/250ML-% IV SOLN
0.0000 ug/min | INTRAVENOUS | Status: DC
  Administered 2021-09-25: 2 ug/min via INTRAVENOUS
  Filled 2021-09-25: qty 250

## 2021-09-25 MED ORDER — DOCUSATE SODIUM 100 MG PO CAPS: 100.0000 mg | ORAL_CAPSULE | Freq: Two times a day (BID) | ORAL | Status: DC | PRN

## 2021-09-25 MED ORDER — LACTATED RINGERS IV SOLN
INTRAVENOUS | Status: DC
Start: 2021-09-25 — End: 2021-09-26

## 2021-09-25 MED ORDER — AMIODARONE HCL 200 MG PO TABS
200.0000 mg | ORAL_TABLET | Freq: Every day | ORAL | Status: DC
  Administered 2021-09-25 – 2021-09-28 (×4): 200 mg via ORAL
  Filled 2021-09-25 (×4): qty 1

## 2021-09-25 MED ORDER — SODIUM CHLORIDE 0.9 % IV SOLN: INTRAVENOUS | Status: DC

## 2021-09-25 MED ORDER — POLYETHYLENE GLYCOL 3350 17 G PO PACK
17.0000 g | PACK | Freq: Every day | ORAL | Status: DC | PRN
  Administered 2021-09-26: 17 g via ORAL
  Filled 2021-09-25: qty 1

## 2021-09-25 MED ORDER — PANTOPRAZOLE SODIUM 40 MG IV SOLR
40.0000 mg | Freq: Every day | INTRAVENOUS | Status: DC
  Administered 2021-09-25 – 2021-09-27 (×3): 40 mg via INTRAVENOUS
  Filled 2021-09-25 (×3): qty 40

## 2021-09-25 MED ORDER — HYDROCORTISONE SOD SUC (PF) 100 MG IJ SOLR
100.0000 mg | Freq: Three times a day (TID) | INTRAMUSCULAR | Status: DC
Start: 2021-09-25 — End: 2021-09-26
  Administered 2021-09-25 – 2021-09-26 (×4): 100 mg via INTRAVENOUS
  Filled 2021-09-25 (×4): qty 2

## 2021-09-25 MED ORDER — ONDANSETRON HCL 4 MG/2ML IJ SOLN: 4.0000 mg | Freq: Four times a day (QID) | INTRAMUSCULAR | Status: DC | PRN

## 2021-09-25 MED ORDER — ACETAMINOPHEN 325 MG PO TABS
650.0000 mg | ORAL_TABLET | Freq: Once | ORAL | Status: AC
  Administered 2021-09-25: 650 mg via ORAL
  Filled 2021-09-25: qty 2

## 2021-09-25 NOTE — ED Notes (Signed)
Intensivist at bedside.

## 2021-09-25 NOTE — ED Notes (Signed)
No options for straight stick on pt due to restricted extremity, IV in left AC and IV in [place], nurse will pull blood from [facility] IV

## 2021-09-25 NOTE — ED Notes (Signed)
ECHO in progress to bedside

## 2021-09-25 NOTE — H&P (Signed)
NAME:  Adian Jablonowski MRN:  [id number] DOB:  1951/09/28 LOS: 0 ADMISSION DATE:  09/24/2021 DATE OF SERVICE:  09/25/2021  CHIEF COMPLAINT:  chills/rigors   HISTORY & PHYSICAL  History of Present Illness  This 70 y.o. immunocompromised (renal allograft and liver-kidney transplant), Caucasian male nonsmoker presented to the [facility] Emergency Department via EMS with complaints of chills and rigors that reportedly started about 12 hours ago.  He also reported some nausea (without emesis), which has apparently improved since his ER stay.  He reported some exertional dyspnea.  SpO2 on room air was noted to be 90%, which only improved to 95% on 4 LPM.  He was noted to be hypotensive and was given a "sepsis bolus" which failed to improve his blood pressure.  ER started norepinephrine, and PCCM was asked to admit for shock requiring vasopressor administration.  Of note, the patient has a history of atrial fibrillation for which he takes amiodarone.  He ran out of amiodarone 2-3 days ago.  REVIEW OF SYSTEMS Constitutional: No weight loss. No night sweats. No fever. Chills. No fatigue. HEENT: No headaches, dysphagia, sore throat, otalgia, nasal congestion, PND CV:  No chest pain, orthopnea, PND, swelling in lower extremities, palpitations GI:  Nausea.  No abdominal pain, vomiting, diarrhea, change in bowel pattern, anorexia Resp: No DOE, rest dyspnea, cough, mucus, hemoptysis, wheezing  GU: no dysuria, change in color of urine, no urgency or frequency.  No flank pain. MS:  No joint pain or swelling. No myalgias,  No decreased range of motion.  Psych:  No change in mood or affect. No memory loss. Skin: no rash or lesions.   Past Medical/Surgical/Social/Family History   Past Medical History:  Diagnosis Date   Atrial fibrillation ([place]) 08/2009   CAD (coronary artery disease)    s/p CABG -- 1992   ESRD (end stage renal disease) ([place])    HLD (hyperlipidemia)    HTN  (hypertension)    Idiopathic thrombocytopenia (HCC)     Past Surgical History:  Procedure Laterality Date   APPENDECTOMY     CORONARY ARTERY BYPASS GRAFT     FACIAL COSMETIC SURGERY     GALLBLADDER SURGERY     HERNIA REPAIR     KIDNEY TRANSPLANT     LEFT HEART CATH AND CORONARY ANGIOGRAPHY N/A 08/07/2018   Procedure: LEFT HEART CATH AND CORONARY ANGIOGRAPHY;  Surgeon: Sherren Mocha, MD;  Location: [place] [facility];  Service: Cardiovascular;  Laterality: N/A;   LIVER TRANSPLANT     PRESSURE SENSOR/CARDIOMEMS N/A 08/04/2021   Procedure: PRESSURE SENSOR/CARDIOMEMS;  Surgeon: Larey Dresser, MD;  Location: [place] [facility];  Service: Cardiovascular;  Laterality: N/A;   RIGHT HEART CATH N/A 06/25/2021   Procedure: RIGHT HEART CATH;  Surgeon: Larey Dresser, MD;  Location: [place] [facility];  Service: Cardiovascular;  Laterality: N/A;    Social History   Tobacco Use   Smoking status: Never   Smokeless tobacco: Never  Substance Use Topics   Alcohol use: No    Family History  Problem Relation Age of Onset   Atrial fibrillation Mother    Breast cancer Mother    Renal cancer Father    Hypertension Brother    Hyperlipidemia Brother    Other Brother        stent    Renal Disease Maternal Grandmother    Stroke Maternal Grandmother    Heart attack Maternal Grandfather    Cancer Paternal Grandmother  Heart failure Paternal Grandfather    Emphysema Paternal Grandfather    Bronchiolitis Paternal Grandfather      Procedures:     Significant Diagnostic Tests:     Micro Data:   Results for orders placed or performed during the hospital encounter of 09/24/21  Resp Panel by RT-PCR (Flu A&B, Covid)     Status: None   Collection Time: 09/24/21  9:46 PM   Specimen: Nasopharyngeal(NP) swabs in vial transport medium  Result Value Ref Range Status   SARS Coronavirus 2 by RT PCR NEGATIVE NEGATIVE Final    Comment: (NOTE) SARS-CoV-2 target nucleic acids are NOT  DETECTED.  The SARS-CoV-2 RNA is generally detectable in upper respiratory specimens during the acute phase of infection. The lowest concentration of SARS-CoV-2 viral copies this assay can detect is 138 copies/mL. A negative result does not preclude SARS-Cov-2 infection and should not be used as the sole basis for treatment or other patient management decisions. A negative result may occur with  improper specimen collection/handling, submission of specimen other than nasopharyngeal swab, presence of viral mutation(s) within the areas targeted by this assay, and inadequate number of viral copies(<138 copies/mL). A negative result must be combined with clinical observations, patient history, and epidemiological information. The expected result is Negative.  Fact Sheet for Patients:  EntrepreneurPulse.com.au  Fact Sheet for Healthcare Providers:  IncredibleEmployment.be  This test is no t yet approved or cleared by the [place] FDA and  has been authorized for detection and/or diagnosis of SARS-CoV-2 by FDA under an Emergency Use Authorization (EUA). This EUA will remain  in effect (meaning this test can be used) for the duration of the COVID-19 declaration under Section 564(b)(1) of the Act, 21 U.S.C.section 360bbb-3(b)(1), unless the authorization is terminated  or revoked sooner.       Influenza A by PCR NEGATIVE NEGATIVE Final   Influenza B by PCR NEGATIVE NEGATIVE Final    Comment: (NOTE) The Xpert Xpress SARS-CoV-2/FLU/RSV plus assay is intended as an aid in the diagnosis of influenza from Nasopharyngeal swab specimens and should not be used as a sole basis for treatment. Nasal washings and aspirates are unacceptable for Xpert Xpress SARS-CoV-2/FLU/RSV testing.  Fact Sheet for Patients: EntrepreneurPulse.com.au  Fact Sheet for Healthcare Providers: IncredibleEmployment.be  This test is not yet  approved or cleared by the [place] FDA and has been authorized for detection and/or diagnosis of SARS-CoV-2 by FDA under an Emergency Use Authorization (EUA). This EUA will remain in effect (meaning this test can be used) for the duration of the COVID-19 declaration under Section 564(b)(1) of the Act, 21 U.S.C. section 360bbb-3(b)(1), unless the authorization is terminated or revoked.  Performed at [facility], [place] [address] [place], [place] [address]       Antimicrobials:  Rocephin/azithromcyin (11/18>>)    Interim history/subjective:     Objective   BP (!) 98/57   Pulse (!) 107   Temp (!) 102.5 F (39.2 C) (Rectal)   Resp 20   Ht 5\' 10"  (1.778 m)   Wt 79.4 kg   SpO2 95%   BMI 25.11 kg/m     Filed Weights   09/24/21 2041  Weight: 79.4 kg   No intake or output data in the 24 hours ending 09/25/21 0214      Examination: GENERAL: alert, oriented to time, person and place, pleasant, well-developed. No acute distress. HEAD: normocephalic, atraumatic EYE: PERRLA, EOM intact, no scleral icterus, no pallor. NOSE: nares are patent. No polyps.  No exudate. No sinus tenderness. THROAT/ORAL CAVITY: Normal dentition. No oral thrush. No exudate. Mucous membranes are moist. No tonsillar enlargement.  NECK: supple, no thyromegaly, no JVD, no lymphadenopathy. Trachea midline. CHEST/LUNG: symmetric in development and expansion. Good air entry. No crackles. No wheezes. HEART: Irregularly irregular rate and rhythm.  Tachycardic.   ABDOMEN: soft, nontender, nondistended. Normoactive bowel sounds. No rebound. No guarding. No hepatosplenomegaly. EXTREMITIES: Edema: none. No cyanosis. No clubbing. 2+ DP pulses LYMPHATIC: no cervical/axillary/inguinal lymph nodes appreciated MUSCULOSKELETAL: No point tenderness. No bulk atrophy. Joints: normal inspection.  SKIN:   Dry.  Tenting.  No rash or lesion. NEUROLOGIC: Doll's eyes intact. Corneal reflex intact. Spontaneous  respirations intact. Cranial nerves II-XII are grossly symmetric and physiologic. Babinski absent. No sensory deficit. Motor: 5/5 @ RUE, 5/5 @ LUE, 5/5 @ RLL,  5/5 @ LLL.  DTR: 2+ @ R biceps, 2+ @ L biceps, 2+ @ R patellar,  2+ @ L patellar. No cerebellar signs. Gait was not assessed.   Resolved Hospital Problem list      Assessment & Plan:   ASSESSMENT/PLAN:  ASSESSMENT (included in the Hospital Problem List)  Principal Problem:   Hypotension Active Problems:   Atrial fibrillation (HCC)   Sepsis (HCC)   Dehydration   Acute renal failure superimposed on stage 3 chronic kidney disease (HCC)   Chronic renal allograft nephropathy   Long term (current) use of anticoagulants [Z79.01]   History of liver transplant ([place])   By systems: PULMONARY Elevated A-a gradient Abnormal CXR Supplemental oxygen to maintain SpO2 93+%   CARDIOVASCULAR Atrial fibrillation with rapid ventricular response HFrEF/ischemic cardiomyopathy With a normal lactate level, septic shock is unlikely to be the true etiology of the patient's hypotension.  He is noted to appear dehydrated on exam and is also in atrial fibrillation with rapid ventricular response.  Also, he is known to be on chronic steroid therapy for anti-rejection therapy. Restart Pacerone 200 mg PO daily IV fluids: NS @ 75 mL/hr Wean norepinephrine as tolerated.  Maintain MAP 65+ 2D echo to evaluate pericardial effusion.  2D echo (06/24/21) showed LVEF 45-50% with regional wall motion abnormality.    RENAL Acute on chronic stage III kidney disease Chronic renal allograft nephropathy, CKD stage III History of renal allograft History of liver/kidney transplant Check tacrolimus level Takes prednisone 5 mg PO daily along with tacrolimus 0.5 mg and 1 mg on alternating days.   GASTROINTESTINAL History of liver transplant GI PROPHYLAXIS: Protonix   HEMATOLOGIC Chronic warfarin therapy Supratherapeutic anticoagulation, INR 4.5 Consult  pharmacy for warfarin dosing Hold warfarin for now due to elevated INR DVT PROPHYLAXIS: warfarin   INFECTIOUS Suspected viremia Continue IV fluids Blood cultures On empiric Rocephin/azithromycin   ENDOCRINE Chronic steroid therapy Start SoluCortef 100 mg IV q 8 hr for now   NEUROLOGIC: No acute issues   PLAN/RECOMMENDATIONS  Admit to ICU under my service (Attending: Renee Pain, MD) with the diagnoses highlighted above in the active Hospital Problem List (ASSESSMENT). See above.     My assessment, plan of care, findings, medications, side effects, etc. were discussed with: nurse and patient (answered all questions to patient's satisfaction).   Best practice:  Diet: renal Pain/Anxiety/Delirium protocol (if indicated): N/A VAP protocol (if indicated): N/A DVT prophylaxis: warfarin GI prophylaxis: Protonix Glucose control: N/A Mobility/Activity: bedrest for now    Code Status: Full Code Family Communication:   no family at bedside Disposition: admit to ICU   Labs   CBC: Recent Labs  Lab 09/24/21 2052  WBC 11.8*  NEUTROABS 10.6*  HGB 15.7  HCT 50.4  MCV 82.5  PLT 116*    Basic Metabolic Panel: Recent Labs  Lab 09/24/21 2052  NA 136  K 4.2  CL 99  CO2 24  GLUCOSE 107*  BUN 23  CREATININE 2.58*  CALCIUM 9.6  MG 2.1   GFR: Estimated Creatinine Clearance: 28.3 mL/min (A) (by C-G formula based on SCr of 2.58 mg/dL (H)). Recent Labs  Lab 09/24/21 2052 09/24/21 2252  WBC 11.8*  --   LATICACIDVEN 2.3* 1.7    Liver Function Tests: Recent Labs  Lab 09/24/21 2052  AST 23  ALT 19  ALKPHOS 91  BILITOT 1.9*  PROT 6.9  ALBUMIN 2.9*   Recent Labs  Lab 09/24/21 2052  LIPASE 29   No results for input(s): AMMONIA in the last 168 hours.  ABG    Component Value Date/Time   HCO3 34.2 (H) 06/25/2021 0810   HCO3 34.4 (H) 06/25/2021 0810   TCO2 36 (H) 06/25/2021 0810   TCO2 36 (H) 06/25/2021 0810   O2SAT 69.0 06/25/2021 0810   O2SAT 68.0  06/25/2021 0810     Coagulation Profile: Recent Labs  Lab 09/23/21 0000 09/24/21 2052  INR 4.1* 4.5*    Cardiac Enzymes: No results for input(s): CKTOTAL, CKMB, CKMBINDEX, TROPONINI in the last 168 hours.  HbA1C: Hgb A1c MFr Bld  Date/Time Value Ref Range Status  08/05/2018 06:39 PM 5.3 4.8 - 5.6 % Final    Comment:    (NOTE) Pre diabetes:          5.7%-6.4% Diabetes:              >6.4% Glycemic control for   <7.0% adults with diabetes   12/18/2016 05:15 AM 5.3 4.8 - 5.6 % Final    Comment:    (NOTE)         Pre-diabetes: 5.7 - 6.4         Diabetes: >6.4         Glycemic control for adults with diabetes: <7.0     CBG: Recent Labs  Lab 09/24/21 2153  GLUCAP 46*     Past Medical History   Past Medical History:  Diagnosis Date   Atrial fibrillation ([place]) 08/2009   CAD (coronary artery disease)    s/p CABG -- 1992   ESRD (end stage renal disease) ([place])    HLD (hyperlipidemia)    HTN (hypertension)    Idiopathic thrombocytopenia ([place])       Surgical History    Past Surgical History:  Procedure Laterality Date   APPENDECTOMY     CORONARY ARTERY BYPASS GRAFT     FACIAL COSMETIC SURGERY     GALLBLADDER SURGERY     HERNIA REPAIR     KIDNEY TRANSPLANT     LEFT HEART CATH AND CORONARY ANGIOGRAPHY N/A 08/07/2018   Procedure: LEFT HEART CATH AND CORONARY ANGIOGRAPHY;  Surgeon: Sherren Mocha, MD;  Location: [place] [facility];  Service: Cardiovascular;  Laterality: N/A;   LIVER TRANSPLANT     PRESSURE SENSOR/CARDIOMEMS N/A 08/04/2021   Procedure: PRESSURE SENSOR/CARDIOMEMS;  Surgeon: Larey Dresser, MD;  Location: [place] [facility];  Service: Cardiovascular;  Laterality: N/A;   RIGHT HEART CATH N/A 06/25/2021   Procedure: RIGHT HEART CATH;  Surgeon: Larey Dresser, MD;  Location: [place] [facility];  Service: Cardiovascular;  Laterality: N/A;      Social History   Social History   Socioeconomic History  Marital status: Single    Spouse name:  Not on file   Number of children: 0   Years of education: Not on file   Highest education level: Not on file  Occupational History   Occupation: retired  Tobacco Use   Smoking status: Never   Smokeless tobacco: Never  Substance and Sexual Activity   Alcohol use: No   Drug use: No   Sexual activity: Not on file  Other Topics Concern   Not on file  Social History Narrative   Not on file   Social Determinants of Health   Financial Resource Strain: Low Risk    Difficulty of Paying Living Expenses: Not very hard  Food Insecurity: No Food Insecurity   Worried About Running Out of Food in the Last Year: Never true   Ran Out of Food in the Last Year: Never true  Transportation Needs: No Transportation Needs   Lack of Transportation (Medical): No   Lack of Transportation (Non-Medical): No  Physical Activity: Not on file  Stress: Not on file  Social Connections: Not on file      Family History    Family History  Problem Relation Age of Onset   Atrial fibrillation Mother    Breast cancer Mother    Renal cancer Father    Hypertension Brother    Hyperlipidemia Brother    Other Brother        stent    Renal Disease Maternal Grandmother    Stroke Maternal Grandmother    Heart attack Maternal Grandfather    Cancer Paternal Grandmother    Heart failure Paternal Grandfather    Emphysema Paternal Grandfather    Bronchiolitis Paternal Grandfather    family history includes Atrial fibrillation in his mother; Breast cancer in his mother; Bronchiolitis in his paternal grandfather; Cancer in his paternal grandmother; Emphysema in his paternal grandfather; Heart attack in his maternal grandfather; Heart failure in his paternal grandfather; Hyperlipidemia in his brother; Hypertension in his brother; Other in his brother; Renal Disease in his maternal grandmother; Renal cancer in his father; Stroke in his maternal grandmother.    Allergies Allergies  Allergen Reactions   Other  Anaphylaxis    Spider venom   Grapefruit Concentrate Other (See Comments)    Pt has been told not to eat grapefruit   Haloperidol Other (See Comments)    Caused the patient to be overly-sedated   Nsaids Other (See Comments)    Cannot take due to liver transplant   Rifampin Other (See Comments)    Caused flushing   Triazolam Other (See Comments)    Caused the patient to be overly-sedated      Current Medications  Current Facility-Administered Medications:    azithromycin (ZITHROMAX) 500 mg in sodium chloride 0.9 % 250 mL IVPB, 500 mg, Intravenous, Q24H, Kolongowski, Beth, DO, Stopped at 09/25/21 0043   cefTRIAXone (ROCEPHIN) 2 g in sodium chloride 0.9 % 100 mL IVPB, 2 g, Intravenous, Q24H, Kolongowski, Beth, DO, Stopped at 09/24/21 2309   [COMPLETED] lactated ringers bolus 1,000 mL, 1,000 mL, Intravenous, Once, Stopped at 09/25/21 0043 **AND** lactated ringers bolus 500 mL, 500 mL, Intravenous, Once, Kolongowski, Beth, DO, Held at 09/25/21 0106   lactated ringers infusion, , Intravenous, Continuous, Kolongowski, Beth, DO, Last Rate: 150 mL/hr at 09/24/21 2309, New Bag at 09/24/21 2309   norepinephrine (LEVOPHED) 4mg  in 277mL premix infusion, 0-40 mcg/min, Intravenous, Continuous, Kolongowski, Beth, DO, Last Rate: 7.5 mL/hr at 09/25/21 0052, 2 mcg/min at 09/25/21 [phone]  sodium chloride 0.9 % bolus 500 mL, 500 mL, Intravenous, Once, Long, Wonda Olds, MD  Current Outpatient Medications:    allopurinol (ZYLOPRIM) 100 MG tablet, Take 100 mg by mouth daily., Disp: , Rfl:    amiodarone (PACERONE) 200 MG tablet, Take 1 tablet (200 mg total) by mouth daily., Disp: 30 tablet, Rfl: 11   atorvastatin (LIPITOR) 40 MG tablet, Take 1 tablet (40 mg total) by mouth at bedtime., Disp: 90 tablet, Rfl: 3   calcitRIOL (ROCALTROL) 0.25 MCG capsule, Take 0.25 mcg by mouth daily., Disp: , Rfl:    Cholecalciferol (VITAMIN D3) 50 MCG (2000 UT) TABS, Take 2,000 Units by mouth daily., Disp: , Rfl:    Cyanocobalamin  (VITAMIN B12) 1000 MCG TBCR, Take 1,000 mcg by mouth daily., Disp: , Rfl:    dapagliflozin propanediol (FARXIGA) 10 MG TABS tablet, Take 1 tablet (10 mg total) by mouth daily., Disp: 30 tablet, Rfl: 6   DULoxetine (CYMBALTA) 60 MG capsule, Take 60 mg by mouth at bedtime., Disp: , Rfl:    EPINEPHrine (EPI-PEN) 0.3 mg/0.3 mL DEVI, Inject 0.3 mg into the muscle once as needed (severe allergic reaction)., Disp: , Rfl:    ferrous sulfate 325 (65 FE) MG tablet, Take 325 mg by mouth daily with breakfast., Disp: , Rfl:    fish oil-omega-3 fatty acids 1000 MG capsule, Take 1 g by mouth 2 (two) times daily., Disp: , Rfl:    MAGNESIUM-OXIDE 400 (240 Mg) MG tablet, Take 400 mg by mouth 2 (two) times daily., Disp: , Rfl:    melatonin 5 MG TABS, Take 5 mg by mouth at bedtime., Disp: , Rfl:    nitroGLYCERIN (NITROSTAT) 0.4 MG SL tablet, Place 0.4 mg under the tongue every 5 (five) minutes as needed for chest pain., Disp: , Rfl:    potassium chloride SA (KLOR-CON) 20 MEQ tablet, Take 2 tablets (40 mEq total) by mouth 3 (three) times daily., Disp: 90 tablet, Rfl: 6   predniSONE (DELTASONE) 5 MG tablet, Take 1 tablet (5 mg total) by mouth daily., Disp: , Rfl:    QUEtiapine (SEROQUEL) 50 MG tablet, Take 150 mg by mouth at bedtime., Disp: , Rfl:    tacrolimus (PROGRAF) 0.5 MG capsule, Take 0.5 mg by mouth See admin instructions. Takes 0.5 mg  tablet by mouth one day and 1 mg tablets next day (1 and 2 tablets on alternate days), Disp: , Rfl:    torsemide (DEMADEX) 20 MG tablet, Take 4 tablets (80 mg total) by mouth 2 (two) times daily., Disp: 210 tablet, Rfl: 6   warfarin (COUMADIN) 4 MG tablet, TAKE AS DIRECTED BY COUMADIN CLINIC (Patient taking differently: Take 2-4 mg by mouth See admin instructions. Takes 4 mg on Tuesdays and 2 mg on all other days), Disp: 65 tablet, Rfl: 1   zolpidem (AMBIEN) 5 MG tablet, Take 5 mg by mouth at bedtime as needed for sleep., Disp: , Rfl:    Home Medications  Prior to Admission  medications   Medication Sig Start Date End Date Taking? Authorizing Provider  allopurinol (ZYLOPRIM) 100 MG tablet Take 100 mg by mouth daily.    [provider]  amiodarone (PACERONE) 200 MG tablet Take 1 tablet (200 mg total) by mouth daily. 09/22/21   Larey Dresser, MD  atorvastatin (LIPITOR) 40 MG tablet Take 1 tablet (40 mg total) by mouth at bedtime. 09/17/21   Larey Dresser, MD  calcitRIOL (ROCALTROL) 0.25 MCG capsule Take 0.25 mcg by mouth daily.  [provider]  Cholecalciferol (VITAMIN D3) 50 MCG (2000 UT) TABS Take 2,000 Units by mouth daily.    [provider]  Cyanocobalamin (VITAMIN B12) 1000 MCG TBCR Take 1,000 mcg by mouth daily.    [provider]  dapagliflozin propanediol (FARXIGA) 10 MG TABS tablet Take 1 tablet (10 mg total) by mouth daily. 07/03/21   Larey Dresser, MD  DULoxetine (CYMBALTA) 60 MG capsule Take 60 mg by mouth at bedtime.    [provider]  EPINEPHrine (EPI-PEN) 0.3 mg/0.3 mL DEVI Inject 0.3 mg into the muscle once as needed (severe allergic reaction).    [provider]  ferrous sulfate 325 (65 FE) MG tablet Take 325 mg by mouth daily with breakfast.    [provider]  fish oil-omega-3 fatty acids 1000 MG capsule Take 1 g by mouth 2 (two) times daily.    [provider]  MAGNESIUM-OXIDE 400 (240 Mg) MG tablet Take 400 mg by mouth 2 (two) times daily. 05/04/21   [provider]  melatonin 5 MG TABS Take 5 mg by mouth at bedtime.    [provider]  nitroGLYCERIN (NITROSTAT) 0.4 MG SL tablet Place 0.4 mg under the tongue every 5 (five) minutes as needed for chest pain.    [provider]  potassium chloride SA (KLOR-CON) 20 MEQ tablet Take 2 tablets (40 mEq total) by mouth 3 (three) times daily. 08/04/21   Larey Dresser, MD  predniSONE (DELTASONE) 5 MG tablet Take 1 tablet (5 mg total) by mouth daily. 12/29/16   [name], MD  QUEtiapine  (SEROQUEL) 50 MG tablet Take 150 mg by mouth at bedtime. 08/13/19   [provider]  tacrolimus (PROGRAF) 0.5 MG capsule Take 0.5 mg by mouth See admin instructions. Takes 0.5 mg  tablet by mouth one day and 1 mg tablets next day (1 and 2 tablets on alternate days)    [provider]  torsemide (DEMADEX) 20 MG tablet Take 4 tablets (80 mg total) by mouth 2 (two) times daily. 09/22/21   Clegg, Amy D, NP  warfarin (COUMADIN) 4 MG tablet TAKE AS DIRECTED BY COUMADIN CLINIC Patient taking differently: Take 2-4 mg by mouth See admin instructions. Takes 4 mg on Tuesdays and 2 mg on all other days 09/19/20   Deboraha Sprang, MD  zolpidem (AMBIEN) 5 MG tablet Take 5 mg by mouth at bedtime as needed for sleep. 07/03/21   [provider]      Critical care time: 30 minutes.  The treatment and management of the patient's condition was required based on the threat of imminent deterioration. This time reflects time spent by the physician evaluating, providing care and managing the critically ill patient's care. The time was spent at the immediate bedside (or on the same floor/unit and dedicated to this patient's care). Time involved in separately billable procedures is NOT included int he critical care time indicated above. Family meeting and update time may be included above if and only if the patient is unable/incompetent to participate in clinical interview and/or decision making, and the discussion was necessary to determining treatment decisions.   Renee Pain, MD Board Certified by the ABIM, [place]

## 2021-09-25 NOTE — ED Notes (Signed)
IV team at bedside 

## 2021-09-25 NOTE — Progress Notes (Signed)
ANTICOAGULATION CONSULT NOTE - Initial Consult  Pharmacy Consult for Warfarin  Indication: atrial fibrillation  Allergies  Allergen Reactions   Other Anaphylaxis    Spider venom   Grapefruit Concentrate Other (See Comments)    Pt has been told not to eat grapefruit   Haloperidol Other (See Comments)    Caused the patient to be overly-sedated   Nsaids Other (See Comments)    Cannot take due to liver transplant   Rifampin Other (See Comments)    Caused flushing   Triazolam Other (See Comments)    Caused the patient to be overly-sedated    Patient Measurements: Height: 5\' 10"  (177.8 cm) Weight: 79.4 kg (175 lb) IBW/kg (Calculated) : 73  Vital Signs: Temp: 102.5 F (39.2 C) (11/17 2300) Temp Source: Rectal (11/17 2300) BP: 90/62 (11/18 0300) Pulse Rate: 88 (11/18 0300)  Labs: Recent Labs    09/23/21 0000 09/24/21 2052 09/24/21 2252  HGB  --  15.7  --   HCT  --  50.4  --   PLT  --  116*  --   LABPROT  --  42.6*  --   INR 4.1* 4.5*  --   CREATININE  --  2.58*  --   TROPONINIHS  --  21* 23*    Estimated Creatinine Clearance: 28.3 mL/min (A) (by C-G formula based on SCr of 2.58 mg/dL (H)).   Medical History: Past Medical History:  Diagnosis Date   Atrial fibrillation ([place]) 08/2009   CAD (coronary artery disease)    s/p CABG -- 1992   ESRD (end stage renal disease) ([place])    HLD (hyperlipidemia)    HTN (hypertension)    Idiopathic thrombocytopenia (HCC)      Assessment: 70 y/o immunocompromised male with rigors/chills. On warfarin PTA for Afib. INR is supra-therapeutic at 4.5. Hgb 15.7. Scr 2.58. Continuing warfarin therapy.  Goal of Therapy:  INR 2-3 Monitor platelets by anticoagulation protocol: Yes   Plan:  Hold warfarin for now Daily PT/INR Resume warfarin as INR allows  Narda Bonds, PharmD, BCPS Clinical Pharmacist Phone: [phone]

## 2021-09-25 NOTE — Telephone Encounter (Signed)
Patient called back after he left a message to say that [facility] did not receive a prescription for his Amiodarone.  He tells me that he is currently hospitalized and will let the provider know at discharge.

## 2021-09-25 NOTE — Progress Notes (Signed)
Echo reviewed, unchanged from prior.

## 2021-09-25 NOTE — Progress Notes (Signed)
09/25/2021   I have seen and evaluated the patient for immunocompromised shock.  S:  Shock resolved.  Patient feeling better. Denies pain/dysuria/cough/fever.  O: Blood pressure 97/68, pulse 78, temperature 98.3 F (36.8 C), temperature source Oral, resp. rate 15, height 5\' 10"  (1.778 m), weight 79.4 kg, SpO2 100 %.  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 Developing interstitial lung disease vs. Atypical infection -Supratherapeutic INR -AKI on CKD3  P:  - Switch NS to LR @ 75 - Continue stress steroids today, wean back to home dose prednisone 5mg  tomorrow if Bps remain recovered - Check respiratory viral panel - Avoid nephrotoxins - f/u tacro level, appreciate pharmD assistance - Hold warfarin, goal INR 2-3, again appreciate pharmD help - Continue CAP abx coverage, f/u culture data; will add usual urine Ag - Echo ordered for ?pericardial effusion, will f/u  Stable for admission to progressive, appreciate [facility] taking over care starting 09/26/21  Erskine Emery MD [facility] Prefer epic messenger for cross cover needs If after hours, please call E-link

## 2021-09-25 NOTE — ED Notes (Signed)
Per MD do not need second set of cultures if first set is already drawn - this RN double checked with micro and micro confirmed that they have received them

## 2021-09-26 DIAGNOSIS — I4821 Permanent atrial fibrillation: Secondary | ICD-10-CM | POA: Diagnosis not present

## 2021-09-26 DIAGNOSIS — I959 Hypotension, unspecified: Secondary | ICD-10-CM | POA: Diagnosis not present

## 2021-09-26 DIAGNOSIS — T8611 Kidney transplant rejection: Secondary | ICD-10-CM

## 2021-09-26 LAB — URINE CULTURE

## 2021-09-26 LAB — GLUCOSE, CAPILLARY
Glucose-Capillary: 158 mg/dL — ABNORMAL HIGH (ref 70–99)
Glucose-Capillary: 153 mg/dL — ABNORMAL HIGH (ref 70–99)
Glucose-Capillary: 178 mg/dL — ABNORMAL HIGH (ref 70–99)

## 2021-09-26 LAB — RESPIRATORY PANEL BY PCR

## 2021-09-26 LAB — CBC
HCT: 40.4 % (ref 39.0–52.0)
Hemoglobin: 12.5 g/dL — ABNORMAL LOW (ref 13.0–17.0)
MCH: 25.6 pg — ABNORMAL LOW (ref 26.0–34.0)
MCHC: 30.9 g/dL (ref 30.0–36.0)
MCV: 82.6 fL (ref 80.0–100.0)
Platelets: 81 10*3/uL — ABNORMAL LOW (ref 150–400)
RBC: 4.89 MIL/uL (ref 4.22–5.81)
RDW: 15.7 % — ABNORMAL HIGH (ref 11.5–15.5)
WBC: 6 10*3/uL (ref 4.0–10.5)
nRBC: 0 % (ref 0.0–0.2)

## 2021-09-26 LAB — BASIC METABOLIC PANEL
Anion gap: 7 (ref 5–15)
BUN: 23 mg/dL (ref 8–23)
CO2: 25 mmol/L (ref 22–32)
Calcium: 8.9 mg/dL (ref 8.9–10.3)
Chloride: 104 mmol/L (ref 98–111)
Creatinine, Ser: 2.03 mg/dL — ABNORMAL HIGH (ref 0.61–1.24)
GFR, Estimated: 35 mL/min — ABNORMAL LOW (ref >60)
Glucose, Bld: 206 mg/dL — ABNORMAL HIGH (ref 70–99)
Potassium: 4.2 mmol/L (ref 3.5–5.1)
Sodium: 136 mmol/L (ref 135–145)

## 2021-09-26 LAB — PHOSPHORUS: Phosphorus: 3.1 mg/dL (ref 2.5–4.6)

## 2021-09-26 LAB — PROTIME-INR
INR: 3.4 — ABNORMAL HIGH (ref 0.8–1.2)
Prothrombin Time: 34.3 seconds — ABNORMAL HIGH (ref 11.4–15.2)

## 2021-09-26 LAB — MAGNESIUM: Magnesium: 2.3 mg/dL (ref 1.7–2.4)

## 2021-09-26 MED ORDER — HYDROCORTISONE SOD SUC (PF) 100 MG IJ SOLR
100.0000 mg | Freq: Two times a day (BID) | INTRAMUSCULAR | Status: AC
  Administered 2021-09-26 – 2021-09-27 (×3): 100 mg via INTRAVENOUS
  Filled 2021-09-26 (×3): qty 2

## 2021-09-26 MED ORDER — DAPAGLIFLOZIN PROPANEDIOL 10 MG PO TABS
10.0000 mg | ORAL_TABLET | Freq: Every day | ORAL | Status: DC
  Administered 2021-09-26 – 2021-09-28 (×3): 10 mg via ORAL
  Filled 2021-09-26 (×3): qty 1

## 2021-09-26 MED ORDER — ZOLPIDEM TARTRATE 5 MG PO TABS
5.0000 mg | ORAL_TABLET | Freq: Every evening | ORAL | Status: DC | PRN
  Administered 2021-09-27 – 2021-09-28 (×2): 5 mg via ORAL
  Filled 2021-09-26 (×2): qty 1

## 2021-09-26 MED ORDER — QUETIAPINE FUMARATE 50 MG PO TABS
150.0000 mg | ORAL_TABLET | Freq: Every day | ORAL | Status: DC
  Administered 2021-09-26 – 2021-09-27 (×2): 150 mg via ORAL
  Filled 2021-09-26 (×2): qty 1

## 2021-09-26 MED ORDER — DULOXETINE HCL 60 MG PO CPEP
60.0000 mg | ORAL_CAPSULE | Freq: Every day | ORAL | Status: DC
  Administered 2021-09-26 – 2021-09-27 (×2): 60 mg via ORAL
  Filled 2021-09-26 (×2): qty 1

## 2021-09-26 MED ORDER — CALCITRIOL 0.25 MCG PO CAPS
0.2500 ug | ORAL_CAPSULE | Freq: Every day | ORAL | Status: DC
Start: 2021-09-26 — End: 2021-09-28
  Administered 2021-09-26 – 2021-09-28 (×3): 0.25 ug via ORAL
  Filled 2021-09-26 (×3): qty 1

## 2021-09-26 MED ORDER — WARFARIN SODIUM 1 MG PO TABS
1.0000 mg | ORAL_TABLET | Freq: Once | ORAL | Status: AC
  Administered 2021-09-26: 1 mg via ORAL
  Filled 2021-09-26: qty 1

## 2021-09-26 MED ORDER — MELATONIN 5 MG PO TABS
5.0000 mg | ORAL_TABLET | Freq: Every day | ORAL | Status: DC
  Administered 2021-09-26 – 2021-09-27 (×2): 5 mg via ORAL
  Filled 2021-09-26 (×2): qty 1

## 2021-09-26 MED ORDER — INSULIN ASPART 100 UNIT/ML IJ SOLN
0.0000 [IU] | Freq: Three times a day (TID) | INTRAMUSCULAR | Status: DC
  Administered 2021-09-26 – 2021-09-27 (×3): 2 [IU] via SUBCUTANEOUS
  Administered 2021-09-27 (×2): 1 [IU] via SUBCUTANEOUS
  Administered 2021-09-28: 3 [IU] via SUBCUTANEOUS
  Administered 2021-09-28: 2 [IU] via SUBCUTANEOUS

## 2021-09-26 MED ORDER — TACROLIMUS 0.5 MG PO CAPS
0.5000 mg | ORAL_CAPSULE | ORAL | Status: DC
  Administered 2021-09-26 – 2021-09-28 (×2): 0.5 mg via ORAL
  Filled 2021-09-26 (×2): qty 1

## 2021-09-26 MED ORDER — VITAMIN D 25 MCG (1000 UNIT) PO TABS
2000.0000 [IU] | ORAL_TABLET | Freq: Every day | ORAL | Status: DC
  Administered 2021-09-26 – 2021-09-28 (×3): 2000 [IU] via ORAL
  Filled 2021-09-26 (×3): qty 2

## 2021-09-26 MED ORDER — WARFARIN - PHARMACIST DOSING INPATIENT: Freq: Every day | Status: DC

## 2021-09-26 MED ORDER — INSULIN ASPART 100 UNIT/ML IJ SOLN: 0.0000 [IU] | Freq: Every day | INTRAMUSCULAR | Status: DC

## 2021-09-26 MED ORDER — VITAMIN B-12 1000 MCG PO TABS
1000.0000 ug | ORAL_TABLET | Freq: Every day | ORAL | Status: DC
  Administered 2021-09-26 – 2021-09-28 (×3): 1000 ug via ORAL
  Filled 2021-09-26 (×2): qty 2
  Filled 2021-09-26: qty 1
  Filled 2021-09-26: qty 2

## 2021-09-26 MED ORDER — TACROLIMUS 1 MG PO CAPS
1.0000 mg | ORAL_CAPSULE | ORAL | Status: DC
  Administered 2021-09-27: 1 mg via ORAL
  Filled 2021-09-26: qty 1

## 2021-09-26 MED ORDER — MAGNESIUM OXIDE -MG SUPPLEMENT 400 (240 MG) MG PO TABS
400.0000 mg | ORAL_TABLET | Freq: Two times a day (BID) | ORAL | Status: DC
  Administered 2021-09-26 – 2021-09-28 (×5): 400 mg via ORAL
  Filled 2021-09-26 (×5): qty 1

## 2021-09-26 NOTE — Progress Notes (Signed)
ANTICOAGULATION CONSULT NOTE - Initial Consult  Pharmacy Consult for Warfarin  Indication: atrial fibrillation  Allergies  Allergen Reactions   Other Anaphylaxis    Spider venom   Grapefruit Concentrate Other (See Comments)    Pt has been told not to eat grapefruit   Haloperidol Other (See Comments)    Caused the patient to be overly-sedated   Nsaids Other (See Comments)    Cannot take due to liver transplant   Rifampin Other (See Comments)    Caused flushing   Triazolam Other (See Comments)    Caused the patient to be overly-sedated    Patient Measurements: Height: 5\' 10"  (177.8 cm) Weight: 84.1 kg (185 lb 6.5 oz) IBW/kg (Calculated) : 73  Vital Signs: Temp: 98 F (36.7 C) (11/19 0322) Temp Source: Oral (11/19 0322) BP: 114/72 (11/19 0322) Pulse Rate: 63 (11/19 0322)  Labs: Recent Labs    09/24/21 2052 09/24/21 2252 09/25/21 0336 09/26/21 0102  HGB 15.7  --  14.0 12.5*  HCT 50.4  --  47.1 40.4  PLT 116*  --  98* 81*  LABPROT 42.6*  --   --  34.3*  INR 4.5*  --   --  3.4*  CREATININE 2.58*  --  2.47* 2.03*  TROPONINIHS 21* 23*  --   --      Estimated Creatinine Clearance: 36 mL/min (A) (by C-G formula based on SCr of 2.03 mg/dL (H)).   Medical History: Past Medical History:  Diagnosis Date   Atrial fibrillation ([place]) 08/2009   CAD (coronary artery disease)    s/p CABG -- 1992   ESRD (end stage renal disease) ([place])    HLD (hyperlipidemia)    HTN (hypertension)    Idiopathic thrombocytopenia (HCC)      Assessment: 70 y/o immunocompromised male with rigors/chills. On warfarin PTA for Afib, pharmacy to dose. INR is supra-therapeutic on admit, pt with AKI as well.  INR remains elevated but is down to 3.4, H/H stable, thrombocytopenia appears chronic.  Home warfarin dose is 4mg  Tues, 2mg  AODs.  Goal of Therapy:  INR 2-3 Monitor platelets by anticoagulation protocol: Yes   Plan:  Warfarin 1mg  PO x1 tonight Daily protime  Arrie Senate,  PharmD, BCPS, [facility] Clinical Pharmacist [phone] Please check AMION for all [facility] numbers 09/26/2021

## 2021-09-26 NOTE — Progress Notes (Signed)
Progress Note    [name]  OTL:[id number] DOB: 1951-02-01  DOA: 09/24/2021 PCP: [name]., MD    Brief Narrative:   Medical records reviewed and are as summarized below:  [name] is an 70 y.o. male immunocompromised (renal allograft and liver-kidney transplant), Caucasian male nonsmoker presented to the [facility] Emergency Department via EMS with complaints of chills and rigors that reportedly started about 12 hours ago.  He also reported some nausea (without emesis), which has apparently improved since his ER stay.  Tx from PCCM on 11/19.  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  Disposition Plan: Status is: Inpatient  Remains inpatient appropriate because: wean IV steroids, resolution of shock etc         Medical Consultants:   PCCM  Subjective:   No further chills Eating well Did ask for help with sleep  Objective:    Vitals:   09/26/21 0322 09/26/21 0601 09/26/21 0734 09/26/21 1200  BP: 114/72  118/75 (!) 115/58  Pulse: 63  71 68  Resp: 20  14 20   Temp: 98 F (36.7  C)  98.1 F (36.7 C) 98 F (36.7 C)  TempSrc: Oral  Oral Oral  SpO2: 97%  95% 95%  Weight:  84.1 kg    Height:        Intake/Output Summary (Last 24 hours) at 09/26/2021 1416 Last data filed at 09/26/2021 1100 Gross per 24 hour  Intake 720 ml  Output 750 ml  Net -30 ml   Filed Weights   09/24/21 2041 09/25/21 2023 09/26/21 0601  Weight: 79.4 kg 83.3 kg 84.1 kg    Exam:  General: Appearance:     Overweight male in no acute distress     Lungs:      respirations unlabored  Heart:    Normal heart rate.   MS:   All extremities are intact.    Neurologic:   Awake, alert, oriented x 3. No apparent focal neurological           defect.      Data Reviewed:   I have personally reviewed following labs and imaging studies:  Labs: Labs show the following:   Basic Metabolic Panel: Recent Labs  Lab 09/24/21 2052 09/25/21 0336 09/26/21 0102  NA 136 133* 136  K 4.2 4.8 4.2  CL 99 100 104  CO2 24 24 25   GLUCOSE 107* 121* 206*  BUN 23 24* 23  CREATININE 2.58* 2.47* 2.03*  CALCIUM 9.6 8.7* 8.9  MG 2.1 2.1 2.3  PHOS  --   --  3.1   GFR Estimated Creatinine Clearance: 36 mL/min (A) (by  C-G formula based on SCr of 2.03 mg/dL (H)). Liver Function Tests: Recent Labs  Lab 09/24/21 2052  AST 23  ALT 19  ALKPHOS 91  BILITOT 1.9*  PROT 6.9  ALBUMIN 2.9*   Recent Labs  Lab 09/24/21 2051/02/04  LIPASE 29   No results for input(s): AMMONIA in the last 168 hours. Coagulation profile Recent Labs  Lab 09/23/21 0000 09/24/21 02-04-2051 09/26/21 0102  INR 4.1* 4.5* 3.4*    CBC: Recent Labs  Lab 09/24/21 February 04, 2051 09/25/21 0336 09/26/21 0102  WBC 11.8* 11.7* 6.0  NEUTROABS 10.6*  --   --   HGB 15.7 14.0 12.5*  HCT 50.4 47.1 40.4  MCV 82.5 84.9 82.6  PLT 116* 98* 81*   Cardiac Enzymes: No results for input(s): CKTOTAL, CKMB, CKMBINDEX, TROPONINI in the last 168 hours. BNP (last 3 results) No results for input(s): PROBNP in the last 8760 hours. CBG: Recent Labs  Lab  09/24/21 02/04/52 09/26/21 1109  GLUCAP 102* 158*   D-Dimer: No results for input(s): DDIMER in the last 72 hours. Hgb A1c: No results for input(s): HGBA1C in the last 72 hours. Lipid Profile: No results for input(s): CHOL, HDL, LDLCALC, TRIG, CHOLHDL, LDLDIRECT in the last 72 hours. Thyroid function studies: Recent Labs    09/24/21 Feb 04, 2051  TSH 1.230   Anemia work up: No results for input(s): VITAMINB12, FOLATE, FERRITIN, TIBC, IRON, RETICCTPCT in the last 72 hours. Sepsis Labs: Recent Labs  Lab 09/24/21 February 04, 2051 09/24/21 2252 09/25/21 0336 09/26/21 0102  WBC 11.8*  --  11.7* 6.0  LATICACIDVEN 2.3* 1.7  --   --     Microbiology Recent Results (from the past 240 hour(s))  Culture, blood (routine x 2)     Status: None (Preliminary result)   Collection Time: 09/24/21  8:14 PM   Specimen: BLOOD  Result Value Ref Range Status   Specimen Description BLOOD LEFT ANTECUBITAL  Final   Special Requests   Final    BOTTLES DRAWN AEROBIC AND ANAEROBIC Blood Culture adequate volume   Culture   Final    NO GROWTH < 24 HOURS Performed at [facility], [address], [place], [place] [address]    Report Status PENDING  Incomplete  Culture, blood (routine x 2)     Status: None (Preliminary result)   Collection Time: 09/24/21  8:57 PM   Specimen: BLOOD  Result Value Ref Range Status   Specimen Description BLOOD SITE NOT SPECIFIED  Final   Special Requests   Final    BOTTLES DRAWN AEROBIC AND ANAEROBIC Blood Culture adequate volume   Culture   Final    NO GROWTH < 24 HOURS Performed at [facility], [place] [address], [place], [place] [address]    Report Status PENDING  Incomplete  Urine Culture     Status: Abnormal   Collection Time: 09/24/21  9:44 PM   Specimen: In/Out Cath Urine  Result Value Ref Range Status   Specimen Description IN/OUT CATH URINE  Final   Special Requests   Final    NONE Performed at [facility], [place] [address], [place], [place] [address]     Culture MULTIPLE SPECIES PRESENT, SUGGEST RECOLLECTION (A)  Final   Report Status 09/26/2021 FINAL  Final  Resp Panel by RT-PCR (Flu A&B, Covid)     Status: None   Collection Time: 09/24/21  9:46 PM   Specimen: Nasopharyngeal(NP) swabs in vial transport medium  Result Value Ref Range Status  SARS Coronavirus 2 by RT PCR NEGATIVE NEGATIVE Final    Comment: (NOTE) SARS-CoV-2 target nucleic acids are NOT DETECTED.  The SARS-CoV-2 RNA is generally detectable in upper respiratory specimens during the acute phase of infection. The lowest concentration of SARS-CoV-2 viral copies this assay can detect is 138 copies/mL. A negative result does not preclude SARS-Cov-2 infection and should not be used as the sole basis for treatment or other patient management decisions. A negative result may occur with  improper specimen collection/handling, submission of specimen other than nasopharyngeal swab, presence of viral mutation(s) within the areas targeted by this assay, and inadequate number of viral copies(<138 copies/mL). A negative result must be combined with clinical observations, patient history, and epidemiological information. The expected result is Negative.  Fact Sheet for Patients:  EntrepreneurPulse.com.au  Fact Sheet for Healthcare Providers:  IncredibleEmployment.be  This test is no t yet approved or cleared by the [place] FDA and  has been authorized for detection and/or diagnosis of SARS-CoV-2 by FDA under an Emergency Use Authorization (EUA). This EUA will remain  in effect (meaning this test can be used) for the duration of the COVID-19 declaration under Section 564(b)(1) of the Act, 21 U.S.C.section 360bbb-3(b)(1), unless the authorization is terminated  or revoked sooner.       Influenza A by PCR NEGATIVE NEGATIVE Final   Influenza B by PCR NEGATIVE NEGATIVE Final    Comment: (NOTE) The Xpert Xpress SARS-CoV-2/FLU/RSV plus assay  is intended as an aid in the diagnosis of influenza from Nasopharyngeal swab specimens and should not be used as a sole basis for treatment. Nasal washings and aspirates are unacceptable for Xpert Xpress SARS-CoV-2/FLU/RSV testing.  Fact Sheet for Patients: EntrepreneurPulse.com.au  Fact Sheet for Healthcare Providers: IncredibleEmployment.be  This test is not yet approved or cleared by the [place] FDA and has been authorized for detection and/or diagnosis of SARS-CoV-2 by FDA under an Emergency Use Authorization (EUA). This EUA will remain in effect (meaning this test can be used) for the duration of the COVID-19 declaration under Section 564(b)(1) of the Act, 21 U.S.C. section 360bbb-3(b)(1), unless the authorization is terminated or revoked.  Performed at [facility], [place] [address] [place], [place] [address]   MRSA Next Gen by PCR, Nasal     Status: None   Collection Time: 09/25/21  8:31 PM   Specimen: Nasal Mucosa; Nasal Swab  Result Value Ref Range Status   MRSA by PCR Next Gen NOT DETECTED NOT DETECTED Final    Comment: (NOTE) The GeneXpert MRSA Assay (FDA approved for NASAL specimens only), is one component of a comprehensive MRSA colonization surveillance program. It is not intended to diagnose MRSA infection nor to guide or monitor treatment for MRSA infections. Test performance is not FDA approved in patients less than 36 years old. Performed at [facility], [place] [address], [place], [place] [address]     Procedures and diagnostic studies:  DG Chest Portable 1 View  Result Date: 09/24/2021 CLINICAL DATA:  Fever. EXAM: PORTABLE CHEST 1 VIEW COMPARISON:  September 17, 2021 FINDINGS: Multiple sternal wires are noted. Stable, diffusely increased interstitial lung markings are noted. There is stable elevation of the right hemidiaphragm. Mild linear atelectasis is seen within the mid right lung. There is no  evidence of a pleural effusion or pneumothorax. The cardiac silhouette is enlarged and unchanged in size. The visualized skeletal structures are unremarkable. IMPRESSION: 1. Increased interstitial lung markings which are likely chronic in nature. A  superimposed component of interstitial edema cannot be excluded. 2. Stable cardiomegaly with mild mid right lung linear atelectasis. Electronically Signed   By: Virgina Norfolk M.D.   On: 09/24/2021 22:09   ECHOCARDIOGRAM COMPLETE  Result Date: 09/25/2021    ECHOCARDIOGRAM REPORT   Patient Name:   SAAHAS HIDROGO [id number] Date of Exam: 09/25/2021 Medical Rec #:  [id number]            Height:       70.0 in Accession #:    [id number]           Weight:       175.0 lb Date of Birth:  1951/08/27            BSA:          1.972 m Patient Age:    26 years             BP:           136/64 mmHg Patient Gender: M                    HR:           83 bpm. Exam Location:  Inpatient Procedure: 2D Echo, Cardiac Doppler and Color Doppler Indications:    r/o pericardial effusion  History:        Patient has prior history of Echocardiogram examinations, most                 recent 06/24/2021. CHF, CAD, Arrythmias:Atrial Fibrillation,                 Signs/Symptoms:Hypotension; Risk Factors:Hypertension.  Sonographer:    Glo Herring Referring Phys: [id number] SEONG-JOO [place]  1. Inferoseptal and basal to mid inferior and inferolateral hypokinesis. Left ventricular ejection fraction, by estimation, is 45 to 50%. The left ventricle has mildly decreased function. The left ventricle demonstrates regional wall motion abnormalities (see scoring diagram/findings for description). There is moderate concentric left ventricular hypertrophy. Left ventricular diastolic parameters are indeterminate. Elevated left ventricular end-diastolic pressure.  2. Right ventricular systolic function is normal. The right ventricular size is normal. There is moderately elevated pulmonary artery systolic  pressure.  3. Left atrial size was severely dilated.  4. Right atrial size was mildly dilated.  5. The mitral valve is normal in structure. Trivial mitral valve regurgitation. No evidence of mitral stenosis.  6. The aortic valve is tricuspid. Aortic valve regurgitation is mild. No aortic stenosis is present.  7. The inferior vena cava is normal in size with <50% respiratory variability, suggesting right atrial pressure of 8 mmHg. FINDINGS  Left Ventricle: Inferoseptal and basal to mid inferior and inferolateral hypokinesis. Left ventricular ejection fraction, by estimation, is 45 to 50%. The left ventricle has mildly decreased function. The left ventricle demonstrates regional wall motion  abnormalities. The left ventricular internal cavity size was normal in size. There is moderate concentric left ventricular hypertrophy. Left ventricular diastolic parameters are indeterminate. Elevated left ventricular end-diastolic pressure. Right Ventricle: The right ventricular size is normal. No increase in right ventricular wall thickness. Right ventricular systolic function is normal. There is moderately elevated pulmonary artery systolic pressure. The tricuspid regurgitant velocity is 3.31 m/s, and with an assumed right atrial pressure of 8 mmHg, the estimated right ventricular systolic pressure is 28.7 mmHg. Left Atrium: Left atrial size was severely dilated. Right Atrium: Right atrial size was mildly dilated. Pericardium: There is no evidence of pericardial effusion. Mitral Valve: The mitral valve is normal  in structure. Trivial mitral valve regurgitation. No evidence of mitral valve stenosis. Tricuspid Valve: The tricuspid valve is normal in structure. Tricuspid valve regurgitation is mild . No evidence of tricuspid stenosis. Aortic Valve: The aortic valve is tricuspid. Aortic valve regurgitation is mild. No aortic stenosis is present. Aortic valve mean gradient measures 6.0 mmHg. Aortic valve peak gradient measures 11.9  mmHg. Aortic valve area, by VTI measures 2.13 cm. Pulmonic Valve: The pulmonic valve was normal in structure. Pulmonic valve regurgitation is not visualized. No evidence of pulmonic stenosis. Aorta: The aortic root is normal in size and structure. Venous: The inferior vena cava is normal in size with less than 50% respiratory variability, suggesting right atrial pressure of 8 mmHg. IAS/Shunts: No atrial level shunt detected by color flow Doppler.  LEFT VENTRICLE PLAX 2D LVIDd:         5.90 cm   Diastology LVIDs:         4.00 cm   LV e' medial:    6.13 cm/s LV PW:         1.50 cm   LV E/e' medial:  21.1 LV IVS:        1.32 cm   LV e' lateral:   7.80 cm/s LVOT diam:     2.00 cm   LV E/e' lateral: 16.6 LV SV:         69 LV SV Index:   35 LVOT Area:     3.14 cm  RIGHT VENTRICLE            IVC RV Basal diam:  3.70 cm    IVC diam: 1.80 cm RV S prime:     7.68 cm/s LEFT ATRIUM             Index        RIGHT ATRIUM           Index LA diam:        5.50 cm 2.79 cm/m   RA Area:     20.90 cm LA Vol (A2C):   88.0 ml 44.62 ml/m  RA Volume:   58.80 ml  29.82 ml/m LA Vol (A4C):   81.6 ml 41.38 ml/m LA Biplane Vol: 89.2 ml 45.23 ml/m  AORTIC VALVE                     PULMONIC VALVE AV Area (Vmax):    2.11 cm      PV Vmax:       1.07 m/s AV Area (Vmean):   2.11 cm      PV Peak grad:  4.6 mmHg AV Area (VTI):     2.13 cm AV Vmax:           172.33 cm/s AV Vmean:          116.000 cm/s AV VTI:            0.323 m AV Peak Grad:      11.9 mmHg AV Mean Grad:      6.0 mmHg LVOT Vmax:         116.00 cm/s LVOT Vmean:        77.933 cm/s LVOT VTI:          0.219 m LVOT/AV VTI ratio: 0.68  AORTA Ao Root diam: 3.30 cm Ao Asc diam:  3.30 cm MITRAL VALVE                TRICUSPID VALVE MV Area (PHT): 4.20 cm  TR Peak grad:   43.8 mmHg MV Decel Time: 181 msec     TR Vmax:        331.00 cm/s MV E velocity: 129.30 cm/s                             SHUNTS                             Systemic VTI:  0.22 m                             Systemic  Diam: 2.00 cm Skeet Latch MD Electronically signed by Skeet Latch MD Signature Date/Time: 09/25/2021/12:15:36 PM    Final     Medications:    amiodarone  200 mg Oral Daily   calcitRIOL  0.25 mcg Oral Daily   cholecalciferol  2,000 Units Oral Daily   dapagliflozin propanediol  10 mg Oral Daily   DULoxetine  60 mg Oral QHS   hydrocortisone sod succinate (SOLU-CORTEF) inj  100 mg Intravenous BID   insulin aspart  0-5 Units Subcutaneous QHS   insulin aspart  0-9 Units Subcutaneous TID WC   magnesium oxide  400 mg Oral BID   melatonin  5 mg Oral QHS   pantoprazole (PROTONIX) IV  40 mg Intravenous QHS   QUEtiapine  150 mg Oral QHS   tacrolimus  0.5 mg Oral QODAY   [START ON 09/27/2021] tacrolimus  1 mg Oral QODAY   vitamin B-12  1,000 mcg Oral Daily   warfarin  1 mg Oral ONCE-1600   Warfarin - Pharmacist Dosing Inpatient   Does not apply q1600   Continuous Infusions:  azithromycin 500 mg (09/25/21 2147)   cefTRIAXone (ROCEPHIN)  IV 2 g (09/25/21 2141)     LOS: 1 day   Geradine Girt  [facility]   How to contact the [facility] Attending or Consulting provider [place] or covering provider during after hours [place], for this patient?  Check the care team in [facility] and look for a) attending/consulting [facility] provider listed and b) the [facility] team listed Log into [email or web address] and use 's universal password to access. If you do not have the password, please contact the hospital operator. Locate the [facility] provider you are looking for under [facility] and page to a number that you can be directly reached. If you still have difficulty reaching the provider, please page the [facility] (Director on Call) for the Hospitalists listed on amion for assistance.  09/26/2021, 2:16 PM

## 2021-09-27 DIAGNOSIS — Z944 Liver transplant status: Secondary | ICD-10-CM | POA: Diagnosis not present

## 2021-09-27 DIAGNOSIS — I959 Hypotension, unspecified: Secondary | ICD-10-CM | POA: Diagnosis not present

## 2021-09-27 DIAGNOSIS — T8611 Kidney transplant rejection: Secondary | ICD-10-CM | POA: Diagnosis not present

## 2021-09-27 LAB — BASIC METABOLIC PANEL
Anion gap: 6 (ref 5–15)
BUN: 17 mg/dL (ref 8–23)
CO2: 25 mmol/L (ref 22–32)
Calcium: 9 mg/dL (ref 8.9–10.3)
Chloride: 106 mmol/L (ref 98–111)
Creatinine, Ser: 1.53 mg/dL — ABNORMAL HIGH (ref 0.61–1.24)
GFR, Estimated: 49 mL/min — ABNORMAL LOW (ref >60)
Glucose, Bld: 222 mg/dL — ABNORMAL HIGH (ref 70–99)
Potassium: 3.7 mmol/L (ref 3.5–5.1)
Sodium: 137 mmol/L (ref 135–145)

## 2021-09-27 LAB — CBC
HCT: 37.2 % — ABNORMAL LOW (ref 39.0–52.0)
Hemoglobin: 11.6 g/dL — ABNORMAL LOW (ref 13.0–17.0)
MCH: 25.8 pg — ABNORMAL LOW (ref 26.0–34.0)
MCHC: 31.2 g/dL (ref 30.0–36.0)
MCV: 82.9 fL (ref 80.0–100.0)
Platelets: 86 10*3/uL — ABNORMAL LOW (ref 150–400)
RBC: 4.49 MIL/uL (ref 4.22–5.81)
RDW: 15.3 % (ref 11.5–15.5)
WBC: 5.2 10*3/uL (ref 4.0–10.5)
nRBC: 0 % (ref 0.0–0.2)

## 2021-09-27 LAB — PHOSPHORUS: Phosphorus: 3.1 mg/dL (ref 2.5–4.6)

## 2021-09-27 LAB — GLUCOSE, CAPILLARY
Glucose-Capillary: 122 mg/dL — ABNORMAL HIGH (ref 70–99)
Glucose-Capillary: 169 mg/dL — ABNORMAL HIGH (ref 70–99)
Glucose-Capillary: 140 mg/dL — ABNORMAL HIGH (ref 70–99)

## 2021-09-27 LAB — LEGIONELLA PNEUMOPHILA SEROGP 1 UR AG: L. pneumophila Serogp 1 Ur Ag: NEGATIVE

## 2021-09-27 LAB — MAGNESIUM: Magnesium: 2.4 mg/dL (ref 1.7–2.4)

## 2021-09-27 LAB — PROTIME-INR
INR: 2.9 — ABNORMAL HIGH (ref 0.8–1.2)
Prothrombin Time: 30 seconds — ABNORMAL HIGH (ref 11.4–15.2)

## 2021-09-27 MED ORDER — PREDNISONE 10 MG PO TABS
5.0000 mg | ORAL_TABLET | Freq: Every day | ORAL | Status: DC
  Administered 2021-09-28: 5 mg via ORAL
  Filled 2021-09-27: qty 1

## 2021-09-27 MED ORDER — WARFARIN SODIUM 2 MG PO TABS
2.0000 mg | ORAL_TABLET | Freq: Once | ORAL | Status: AC
  Administered 2021-09-27: 2 mg via ORAL
  Filled 2021-09-27: qty 1

## 2021-09-27 MED ORDER — TORSEMIDE 20 MG PO TABS
20.0000 mg | ORAL_TABLET | Freq: Two times a day (BID) | ORAL | Status: DC
  Administered 2021-09-27: 20 mg via ORAL
  Filled 2021-09-27: qty 1

## 2021-09-27 NOTE — Progress Notes (Signed)
[place] for Warfarin  Indication: atrial fibrillation  Allergies  Allergen Reactions   Other Anaphylaxis    Spider venom   Grapefruit Concentrate Other (See Comments)    Pt has been told not to eat grapefruit   Haloperidol Other (See Comments)    Caused the patient to be overly-sedated   Nsaids Other (See Comments)    Cannot take due to liver transplant   Rifampin Other (See Comments)    Caused flushing   Triazolam Other (See Comments)    Caused the patient to be overly-sedated    Patient Measurements: Height: 5\' 10"  (177.8 cm) Weight: 85 kg (187 lb 6.3 oz) IBW/kg (Calculated) : 73  Vital Signs: Temp: 97.6 F (36.4 C) (11/20 0451) Temp Source: Axillary (11/20 0451) BP: 101/71 (11/20 0451) Pulse Rate: 73 (11/20 0451)  Labs: Recent Labs    09/24/21 2052 09/24/21 2252 09/25/21 0336 09/26/21 0102 09/27/21 0100  HGB 15.7  --  14.0 12.5* 11.6*  HCT 50.4  --  47.1 40.4 37.2*  PLT 116*  --  98* 81* 86*  LABPROT 42.6*  --   --  34.3* 30.0*  INR 4.5*  --   --  3.4* 2.9*  CREATININE 2.58*  --  2.47* 2.03* 1.53*  TROPONINIHS 21* 23*  --   --   --      Estimated Creatinine Clearance: 47.7 mL/min (A) (by C-G formula based on SCr of 1.53 mg/dL (H)).   Medical History: Past Medical History:  Diagnosis Date   Atrial fibrillation ([place]) 08/2009   CAD (coronary artery disease)    s/p CABG -- 1992   ESRD (end stage renal disease) ([place])    HLD (hyperlipidemia)    HTN (hypertension)    Idiopathic thrombocytopenia (HCC)      Assessment: 70 y/o immunocompromised male with rigors/chills. On warfarin PTA for Afib, pharmacy to dose. INR is supra-therapeutic on admit, pt with AKI as well.  INR therapeutic today at 2.9 s/p warfarin restart yesterday. CBC stable.  Home warfarin dose is 4mg  Tues, 2mg  AODs.  Goal of Therapy:  INR 2-3 Monitor platelets by anticoagulation protocol: Yes   Plan:  Warfarin 2mg  PO x1 tonight Daily  protime  Arrie Senate, PharmD, BCPS, [facility] Clinical Pharmacist [phone] Please check AMION for all [place] numbers 09/27/2021

## 2021-09-27 NOTE — Progress Notes (Signed)
Progress Note    [name]  VOJ:[id number] DOB: Oct 24, 1951  DOA: 09/24/2021 PCP: [name]., MD    Brief Narrative:   Medical records reviewed and are as summarized below:  [name] is an 70 y.o. 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  He also reported some nausea (without emesis), which has apparently improved since his ER stay.  Tx from PCCM on 11/19.  Assessment/Plan:   Principal Problem:   Hypotension Active Problems:   Long term (current) use of anticoagulants [Z79.01]   Atrial fibrillation (HCC)   Sepsis (HCC)   History of liver transplant ([place])   Acute renal failure superimposed on stage 3 chronic kidney disease (HCC)   Chronic renal allograft nephropathy   Dehydration   Shock ([place])   Shock suspicion for hypovolemic + septic; cultures pending; flu/COVID neg -being treated for suspected PNA -improved -wean to home dose steroids -NP swab negative  S/p liver kidney transplant on tacro/prednisone -resume tacro  Afib/RVR improved -on coumadin -rate controlled on amio  Supratherapeutic INR  -pharmacy managing  -no sign of bleeding  AKI on CKD3  -baseline CR is 2  -stable   OOB/ambulate patient  Family Communication/Anticipated D/C date and plan/Code Status   DVT prophylaxis: coumadin Code Status: Full Code.  Disposition Plan: Status is: Inpatient  Remains inpatient appropriate because: wean IV steroids, resolution of shock etc         Medical Consultants:   PCCM  Subjective:   No SOB, no CP  Objective:    Vitals:   09/26/21 2011 09/26/21 2300 09/27/21 0451 09/27/21 0722  BP: 130/69 129/81 101/71 96/64  Pulse: 74 77 73 65  Resp: 16 20 19 17   Temp: 98.1 F (36.7 C) 98.1 F (36.7 C) 97.6 F  (36.4 C) 97.6 F (36.4 C)  TempSrc: Oral Oral Axillary Oral  SpO2: 94% 95% 91% 93%  Weight:   85 kg   Height:        Intake/Output Summary (Last 24 hours) at 09/27/2021 1159 Last data filed at 09/27/2021 0900 Gross per 24 hour  Intake 1217.03 ml  Output 1200 ml  Net 17.03 ml   Filed Weights   09/25/21 2023 09/26/21 0601 09/27/21 0451  Weight: 83.3 kg 84.1 kg 85 kg    Exam:   General: Appearance:     Overweight male in no acute distress     Lungs:     respirations unlabored  Heart:    Normal heart rate.   MS:   All extremities are intact.    Neurologic:   Awake, alert, oriented x 3. No apparent focal neurological           defect.        Data Reviewed:   I have personally reviewed following labs and imaging studies:  Labs: Labs show the following:   Basic Metabolic Panel: Recent Labs  Lab 09/24/21 2052 09/25/21 0336 09/26/21 0102 09/27/21 0100  NA 136 133* 136 137  K 4.2 4.8 4.2 3.7  CL 99 100 104 106  CO2 24 24 25 25   GLUCOSE 107* 121* 206* 222*  BUN 23 24* 23 17  CREATININE 2.58* 2.47* 2.03* 1.53*  CALCIUM 9.6 8.7* 8.9 9.0  MG 2.1 2.1 2.3 2.4  PHOS  --   --  3.1  3.1   GFR Estimated Creatinine Clearance: 47.7 mL/min (A) (by C-G formula based on SCr of 1.53 mg/dL (H)). Liver Function Tests: Recent Labs  Lab 09/24/21 2052  AST 23  ALT 19  ALKPHOS 91  BILITOT 1.9*  PROT 6.9  ALBUMIN 2.9*   Recent Labs  Lab 09/24/21 02/08/2051  LIPASE 29   No results for input(s): AMMONIA in the last 168 hours. Coagulation profile Recent Labs  Lab 09/23/21 0000 09/24/21 February 08, 2051 09/26/21 0102 09/27/21 0100  INR 4.1* 4.5* 3.4* 2.9*    CBC: Recent Labs  Lab 09/24/21 Feb 08, 2051 09/25/21 0336 09/26/21 0102 09/27/21 0100  WBC 11.8* 11.7* 6.0 5.2  NEUTROABS 10.6*  --   --   --   HGB 15.7 14.0 12.5* 11.6*  HCT 50.4 47.1 40.4 37.2*  MCV 82.5 84.9 82.6 82.9  PLT 116* 98* 81* 86*   Cardiac Enzymes: No results for input(s): CKTOTAL, CKMB, CKMBINDEX, TROPONINI  in the last 168 hours. BNP (last 3 results) No results for input(s): PROBNP in the last 8760 hours. CBG: Recent Labs  Lab 09/24/21 February 08, 2152 09/26/21 1109 09/26/21 1611 09/26/21 2210 09/27/21 0615  GLUCAP 102* 158* 153* 178* 122*   D-Dimer: No results for input(s): DDIMER in the last 72 hours. Hgb A1c: No results for input(s): HGBA1C in the last 72 hours. Lipid Profile: No results for input(s): CHOL, HDL, LDLCALC, TRIG, CHOLHDL, LDLDIRECT in the last 72 hours. Thyroid function studies: Recent Labs    09/24/21 2051-02-08  TSH 1.230   Anemia work up: No results for input(s): VITAMINB12, FOLATE, FERRITIN, TIBC, IRON, RETICCTPCT in the last 72 hours. Sepsis Labs: Recent Labs  Lab 09/24/21 02-08-2051 09/24/21 2252 09/25/21 0336 09/26/21 0102 09/27/21 0100  WBC 11.8*  --  11.7* 6.0 5.2  LATICACIDVEN 2.3* 1.7  --   --   --     Microbiology Recent Results (from the past 240 hour(s))  Culture, blood (routine x 2)     Status: None (Preliminary result)   Collection Time: 09/24/21  8:14 PM   Specimen: BLOOD  Result Value Ref Range Status   Specimen Description BLOOD LEFT ANTECUBITAL  Final   Special Requests   Final    BOTTLES DRAWN AEROBIC AND ANAEROBIC Blood Culture adequate volume   Culture   Final    NO GROWTH 2 DAYS Performed at [facility], [place] [address] [place], [place] [address]    Report Status PENDING  Incomplete  Culture, blood (routine x 2)     Status: None (Preliminary result)   Collection Time: 09/24/21  8:57 PM   Specimen: BLOOD  Result Value Ref Range Status   Specimen Description BLOOD SITE NOT SPECIFIED  Final   Special Requests   Final    BOTTLES DRAWN AEROBIC AND ANAEROBIC Blood Culture adequate volume   Culture   Final    NO GROWTH 2 DAYS Performed at [facility], [address], [place], [place] [address]    Report Status PENDING  Incomplete  Urine Culture     Status: Abnormal   Collection Time: 09/24/21  9:44 PM   Specimen: In/Out Cath  Urine  Result Value Ref Range Status   Specimen Description IN/OUT CATH URINE  Final   Special Requests   Final    NONE Performed at [facility], [place] [address] [place], [place] [address]    Culture MULTIPLE SPECIES PRESENT, SUGGEST RECOLLECTION (A)  Final   Report Status 09/26/2021 FINAL  Final  Resp  Panel by RT-PCR (Flu A&B, Covid)     Status: None   Collection Time: 09/24/21  9:46 PM   Specimen: Nasopharyngeal(NP) swabs in vial transport medium  Result Value Ref Range Status   SARS Coronavirus 2 by RT PCR NEGATIVE NEGATIVE Final    Comment: (NOTE) SARS-CoV-2 target nucleic acids are NOT DETECTED.  The SARS-CoV-2 RNA is generally detectable in upper respiratory specimens during the acute phase of infection. The lowest concentration of SARS-CoV-2 viral copies this assay can detect is 138 copies/mL. A negative result does not preclude SARS-Cov-2 infection and should not be used as the sole basis for treatment or other patient management decisions. A negative result may occur with  improper specimen collection/handling, submission of specimen other than nasopharyngeal swab, presence of viral mutation(s) within the areas targeted by this assay, and inadequate number of viral copies(<138 copies/mL). A negative result must be combined with clinical observations, patient history, and epidemiological information. The expected result is Negative.  Fact Sheet for Patients:  EntrepreneurPulse.com.au  Fact Sheet for Healthcare Providers:  IncredibleEmployment.be  This test is no t yet approved or cleared by the [place] FDA and  has been authorized for detection and/or diagnosis of SARS-CoV-2 by FDA under an Emergency Use Authorization (EUA). This EUA will remain  in effect (meaning this test can be used) for the duration of the COVID-19 declaration under Section 564(b)(1) of the Act, 21 U.S.C.section 360bbb-3(b)(1), unless the  authorization is terminated  or revoked sooner.       Influenza A by PCR NEGATIVE NEGATIVE Final   Influenza B by PCR NEGATIVE NEGATIVE Final    Comment: (NOTE) The Xpert Xpress SARS-CoV-2/FLU/RSV plus assay is intended as an aid in the diagnosis of influenza from Nasopharyngeal swab specimens and should not be used as a sole basis for treatment. Nasal washings and aspirates are unacceptable for Xpert Xpress SARS-CoV-2/FLU/RSV testing.  Fact Sheet for Patients: EntrepreneurPulse.com.au  Fact Sheet for Healthcare Providers: IncredibleEmployment.be  This test is not yet approved or cleared by the [place] FDA and has been authorized for detection and/or diagnosis of SARS-CoV-2 by FDA under an Emergency Use Authorization (EUA). This EUA will remain in effect (meaning this test can be used) for the duration of the COVID-19 declaration under Section 564(b)(1) of the Act, 21 U.S.C. section 360bbb-3(b)(1), unless the authorization is terminated or revoked.  Performed at [facility], [place] [address] [place], [place] [address]   Respiratory (~20 pathogens) panel by PCR     Status: None   Collection Time: 09/24/21  9:46 PM   Specimen: Nasopharyngeal Swab; Respiratory  Result Value Ref Range Status   Adenovirus NOT DETECTED NOT DETECTED Final   Coronavirus 229E NOT DETECTED NOT DETECTED Final    Comment: (NOTE) The Coronavirus on the Respiratory Panel, DOES NOT test for the novel  Coronavirus (2019 nCoV)    Coronavirus HKU1 NOT DETECTED NOT DETECTED Final   Coronavirus NL63 NOT DETECTED NOT DETECTED Final   Coronavirus OC43 NOT DETECTED NOT DETECTED Final   Metapneumovirus NOT DETECTED NOT DETECTED Final   Rhinovirus / Enterovirus NOT DETECTED NOT DETECTED Final   Influenza A NOT DETECTED NOT DETECTED Final   Influenza B NOT DETECTED NOT DETECTED Final   Parainfluenza Virus 1 NOT DETECTED NOT DETECTED Final   Parainfluenza Virus 2  NOT DETECTED NOT DETECTED Final   Parainfluenza Virus 3 NOT DETECTED NOT DETECTED Final   Parainfluenza Virus 4 NOT DETECTED NOT DETECTED Final   Respiratory Syncytial  Virus NOT DETECTED NOT DETECTED Final   Bordetella pertussis NOT DETECTED NOT DETECTED Final   Bordetella Parapertussis NOT DETECTED NOT DETECTED Final   Chlamydophila pneumoniae NOT DETECTED NOT DETECTED Final   Mycoplasma pneumoniae NOT DETECTED NOT DETECTED Final    Comment: Performed at [facility], [place] [address] [place], [place] [address]  MRSA Next Gen by PCR, Nasal     Status: None   Collection Time: 09/25/21  8:31 PM   Specimen: Nasal Mucosa; Nasal Swab  Result Value Ref Range Status   MRSA by PCR Next Gen NOT DETECTED NOT DETECTED Final    Comment: (NOTE) The GeneXpert MRSA Assay (FDA approved for NASAL specimens only), is one component of a comprehensive MRSA colonization surveillance program. It is not intended to diagnose MRSA infection nor to guide or monitor treatment for MRSA infections. Test performance is not FDA approved in patients less than 41 years old. Performed at [facility], [place] [address], [place], [place] [address]     Procedures and diagnostic studies:  No results found.  Medications:    amiodarone  200 mg Oral Daily   calcitRIOL  0.25 mcg Oral Daily   cholecalciferol  2,000 Units Oral Daily   dapagliflozin propanediol  10 mg Oral Daily   DULoxetine  60 mg Oral QHS   hydrocortisone sod succinate (SOLU-CORTEF) inj  100 mg Intravenous BID   insulin aspart  0-5 Units Subcutaneous QHS   insulin aspart  0-9 Units Subcutaneous TID WC   magnesium oxide  400 mg Oral BID   melatonin  5 mg Oral QHS   pantoprazole (PROTONIX) IV  40 mg Intravenous QHS   [START ON 09/28/2021] predniSONE  5 mg Oral Daily   QUEtiapine  150 mg Oral QHS   tacrolimus  0.5 mg Oral QODAY   tacrolimus  1 mg Oral QODAY   vitamin B-12  1,000 mcg Oral Daily   warfarin  2 mg Oral ONCE-1600   Warfarin -  Pharmacist Dosing Inpatient   Does not apply q1600   Continuous Infusions:  azithromycin 500 mg (09/26/21 2256)   cefTRIAXone (ROCEPHIN)  IV 2 g (09/26/21 2209)     LOS: 2 days   Geradine Girt  [facility]   How to contact the [facility] Attending or Consulting provider [place] or covering provider during after hours [place], for this patient?  Check the care team in [facility] and look for a) attending/consulting TRH provider listed and b) the [facility] team listed Log into [email or web address] and use [place]'s universal password to access. If you do not have the password, please contact the hospital operator. Locate the [facility] provider you are looking for under [facility] and page to a number that you can be directly reached. If you still have difficulty reaching the provider, please page the [facility] (Director on Call) for the Hospitalists listed on amion for assistance.  09/27/2021, 11:59 AM

## 2021-09-27 NOTE — Progress Notes (Signed)
SATURATION QUALIFICATIONS: (This note is used to comply with regulatory documentation for home oxygen)  Patient Saturations on Room Air at Rest = 95%  Patient Saturations on Room Air while Ambulating = 86%  Patient Saturations on 2 Liters of oxygen while Ambulating = 91%   Pt developed SOB while ambulating in Room Air and sats went upto 86 and HR went upto 137.  While relaxed in bed oxygen saturation is in 90's most of the time and HR is in 80's.  Oxygen started at 2 l after ambulation  Palma Holter, [employer]

## 2021-09-28 ENCOUNTER — Other Ambulatory Visit (HOSPITAL_COMMUNITY): Payer: Self-pay

## 2021-09-28 DIAGNOSIS — I5032 Chronic diastolic (congestive) heart failure: Secondary | ICD-10-CM

## 2021-09-28 DIAGNOSIS — E860 Dehydration: Secondary | ICD-10-CM | POA: Diagnosis not present

## 2021-09-28 DIAGNOSIS — N170 Acute kidney failure with tubular necrosis: Secondary | ICD-10-CM | POA: Diagnosis not present

## 2021-09-28 DIAGNOSIS — I959 Hypotension, unspecified: Secondary | ICD-10-CM | POA: Diagnosis not present

## 2021-09-28 DIAGNOSIS — R579 Shock, unspecified: Secondary | ICD-10-CM

## 2021-09-28 DIAGNOSIS — N183 Chronic kidney disease, stage 3 unspecified: Secondary | ICD-10-CM | POA: Diagnosis not present

## 2021-09-28 LAB — CBC
HCT: 41.1 % (ref 39.0–52.0)
Hemoglobin: 12.4 g/dL — ABNORMAL LOW (ref 13.0–17.0)
MCH: 25.2 pg — ABNORMAL LOW (ref 26.0–34.0)
MCHC: 30.2 g/dL (ref 30.0–36.0)
MCV: 83.5 fL (ref 80.0–100.0)
Platelets: 86 10*3/uL — ABNORMAL LOW (ref 150–400)
RBC: 4.92 MIL/uL (ref 4.22–5.81)
RDW: 15.4 % (ref 11.5–15.5)
WBC: 4.6 10*3/uL (ref 4.0–10.5)
nRBC: 0 % (ref 0.0–0.2)

## 2021-09-28 LAB — BASIC METABOLIC PANEL
Anion gap: 5 (ref 5–15)
BUN: 19 mg/dL (ref 8–23)
CO2: 28 mmol/L (ref 22–32)
Calcium: 8.9 mg/dL (ref 8.9–10.3)
Chloride: 106 mmol/L (ref 98–111)
Creatinine, Ser: 1.64 mg/dL — ABNORMAL HIGH (ref 0.61–1.24)
GFR, Estimated: 45 mL/min — ABNORMAL LOW (ref >60)
Glucose, Bld: 210 mg/dL — ABNORMAL HIGH (ref 70–99)
Potassium: 4 mmol/L (ref 3.5–5.1)
Sodium: 139 mmol/L (ref 135–145)

## 2021-09-28 LAB — GLUCOSE, CAPILLARY
Glucose-Capillary: 159 mg/dL — ABNORMAL HIGH (ref 70–99)
Glucose-Capillary: 132 mg/dL — ABNORMAL HIGH (ref 70–99)
Glucose-Capillary: 94 mg/dL (ref 70–99)

## 2021-09-28 LAB — PROTIME-INR
INR: 2.7 — ABNORMAL HIGH (ref 0.8–1.2)
Prothrombin Time: 28.6 seconds — ABNORMAL HIGH (ref 11.4–15.2)

## 2021-09-28 LAB — MAGNESIUM: Magnesium: 2.2 mg/dL (ref 1.7–2.4)

## 2021-09-28 LAB — PHOSPHORUS: Phosphorus: 2.5 mg/dL (ref 2.5–4.6)

## 2021-09-28 MED ORDER — DOXYCYCLINE HYCLATE 100 MG PO TABS
100.0000 mg | ORAL_TABLET | Freq: Two times a day (BID) | ORAL | 0 refills | Status: DC
  Filled 2021-09-28: qty 4, 2d supply, fill #0

## 2021-09-28 MED ORDER — FUROSEMIDE 10 MG/ML IJ SOLN: 40.0000 mg | Freq: Once | INTRAMUSCULAR | Status: DC

## 2021-09-28 MED ORDER — TORSEMIDE 20 MG PO TABS: 80.0000 mg | ORAL_TABLET | Freq: Every day | ORAL | 6 refills | Status: DC

## 2021-09-28 MED ORDER — TORSEMIDE 20 MG PO TABS: 40.0000 mg | ORAL_TABLET | Freq: Two times a day (BID) | ORAL | Status: DC

## 2021-09-28 MED ORDER — TORSEMIDE 20 MG PO TABS
20.0000 mg | ORAL_TABLET | Freq: Two times a day (BID) | ORAL | Status: DC
  Administered 2021-09-28: 20 mg via ORAL
  Filled 2021-09-28: qty 1

## 2021-09-28 MED ORDER — TORSEMIDE 20 MG PO TABS
20.0000 mg | ORAL_TABLET | Freq: Once | ORAL | Status: AC
  Administered 2021-09-28: 20 mg via ORAL
  Filled 2021-09-28: qty 1

## 2021-09-28 MED ORDER — DOXYCYCLINE HYCLATE 100 MG PO TABS
100.0000 mg | ORAL_TABLET | Freq: Two times a day (BID) | ORAL | Status: DC
  Administered 2021-09-28: 100 mg via ORAL
  Filled 2021-09-28: qty 1

## 2021-09-28 MED ORDER — WARFARIN SODIUM 2 MG PO TABS
2.0000 mg | ORAL_TABLET | Freq: Once | ORAL | Status: AC
  Administered 2021-09-28: 2 mg via ORAL
  Filled 2021-09-28 (×2): qty 1

## 2021-09-28 MED ORDER — AZITHROMYCIN 500 MG PO TABS: 500.0000 mg | ORAL_TABLET | Freq: Once | ORAL | Status: DC

## 2021-09-28 NOTE — Progress Notes (Signed)
[place] for Warfarin  Indication: atrial fibrillation  Allergies  Allergen Reactions   Other Anaphylaxis    Spider venom   Grapefruit Concentrate Other (See Comments)    Pt has been told not to eat grapefruit   Haloperidol Other (See Comments)    Caused the patient to be overly-sedated   Nsaids Other (See Comments)    Cannot take due to liver transplant   Rifampin Other (See Comments)    Caused flushing   Triazolam Other (See Comments)    Caused the patient to be overly-sedated    Patient Measurements: Height: 5\' 10"  (177.8 cm) Weight: 85.9 kg (189 lb 6 oz) IBW/kg (Calculated) : 73  Vital Signs: Temp: 97.7 F (36.5 C) (11/21 0652) Temp Source: Oral (11/20 2359) BP: 98/65 (11/21 0734) Pulse Rate: 71 (11/21 0734)  Labs: Recent Labs    09/26/21 0102 09/27/21 0100 09/28/21 0045  HGB 12.5* 11.6* 12.4*  HCT 40.4 37.2* 41.1  PLT 81* 86* 86*  LABPROT 34.3* 30.0* 28.6*  INR 3.4* 2.9* 2.7*  CREATININE 2.03* 1.53* 1.64*     Estimated Creatinine Clearance: 44.5 mL/min (A) (by C-G formula based on SCr of 1.64 mg/dL (H)).   Medical History: Past Medical History:  Diagnosis Date   Atrial fibrillation ([place]) 08/2009   CAD (coronary artery disease)    s/p CABG -- 1992   ESRD (end stage renal disease) ([place])    HLD (hyperlipidemia)    HTN (hypertension)    Idiopathic thrombocytopenia (HCC)      Assessment: 70 y/o immunocompromised male with rigors/chills. On warfarin PTA for Afib, pharmacy to dose. INR is supra-therapeutic on admit, likely due to changes in PO intake. No New drug interactions seen. Pt with CKD stage 3 as well.   Home warfarin dose is 4mg  Tues, 2mg  AODs. Outpatient time in therapeutic rage: 79%  INR therapeutic today at 2.7 s/p warfarin restart 11/19. CBC stable. Will continue home regimen dose and continue to monitor.   Goal of Therapy:  INR 2-3 Monitor platelets by anticoagulation protocol: Yes   Plan:   Warfarin 2mg  PO x1 tonight 1600 Daily INR, CBC- consider reducing lab frequency when stable Monitor for s/sx of bleeding  Thank You Chrisandra Carota, PharmD Candidate 09/28/2021 8:35 AM

## 2021-09-28 NOTE — Progress Notes (Signed)
SATURATION QUALIFICATIONS: (This note is used to comply with regulatory documentation for home oxygen)  Patient Saturations on Room Air at Rest = 89%  Patient Saturations on Room Air while Ambulating = 81%  Patient Saturations on 2 Liters of oxygen while Ambulating = 90%

## 2021-09-28 NOTE — Progress Notes (Signed)
RN ambulated with patient for approximately 200 feet. Pt O2 sats remained above 91 % during ambulation. Pt had no complaints of dizziness or lightheadedness during ambulation.

## 2021-09-28 NOTE — Progress Notes (Signed)
Patient provided with verbal discharge instructions. Paper copy of discharge provided to patient. RN answered all questions. VSS at discharge. IV removed. Patient belongings sent with patient. Patient dc'd via wheelchair through [employer] to private vehicle.

## 2021-09-28 NOTE — Consult Note (Addendum)
Advanced Heart Failure Team Consult Note   Primary Physician: [name]., MD HF -Cardiologist:  Dr Aundra Dubin  EP : Dr [name]   Reason for Consultation: Heart Failure   HPI:    [name] is seen today for evaluation of heart failure at the request of Dr Eliseo Squires.   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 [name], depression, chronic ITP. Has been on midodrine in past for hypotension.   Patient admitted to [facility] at end of June 2022 with acute on chronic CHF. Diuresed with IV lasix. Sent home on prior home dose furosemide at 40 mg BID.  Metolazone was stopped.  Echo with EF 45%. Coreg held d/t hypotension and started on Toprol xl at D/C. Not on SGLT2i due to cost. Initiated on losartan.    Seen in ED 07/22 with lightheadedness and hypotension. No orthostasis. Losartan and metoprolol held.   Has multiple dental caries treated along with several extractions at [facility] 06/09/21.  Admitted 06/2021 with marked volume overload. Diuresed with IV lasix. Echo with EF 45-50%, multiple WMA, RV okay, PASP 55 mmHg, LA severely dilated, mild TR, trial Martin. Concern for cardiac amyloid with LVH and he underwent PYP scan which was not suggestive of TTR cardiac amyloidosis. He underwent RHC showing elevated right and left filling pressures; RA mean 13, RV 53/15, PA 63/23, mean 39, PCWP mean 20, CO/CI 6.3/3.07, PVR 3 WU  Had Cardiomems placed 08/04/21. Had elevated filling pressures so torsemide was increased 60/40.   Saw Dr Aundra Dubin on 09/17/21. Volume overloaded. Reds Clip 57%. Torsemide was increased to 80 mg /60 mg. CXR concerning for pulmonary edema versus pneumonia.    Ran out of amio 09/22/21.   Presented to [facility] 09/24/21 with chills/rigors/fever.  Placed on sepsis protocol. Lactic  acid 2.3. HS Trop 21>23. WBC 11.8. CXR with interstitial edema. Fluid resuscitation --> 4 liters. Remained hypotensive. Started on norepi. PCCM admitted with shock. Given stress steroids and antibiotics for possible CAP.  Creatinine trending down 2.6>2>1.6 today.   Urine Cx- multiple species Respiratory panel - negative.  Bld CX- no growth today.   Cardiac Testing  PYP scan 8/22: grade 1, H/CL 1.0 => not suggestive of TTR cardiac amyloidoisis.  - RHC (8/22): mean RA 13, PA 63/23 mean 39, mean PCWP 20, CI 3.07, PVR 3 WU.  - Echo (8/22): EF 45-50%, inferoseptal/inferior/inferolateral hypokinesis, normal RV.  - Cardiomems Implant 9/22 Review of Systems: [y] = yes, [ ]  = no On admit   General: Weight gain [ ] ; Weight loss [ ] ; Anorexia [ ] ; Fatigue [ Y]; Fever [ Y]; Chills [ ] ; Weakness [ ]   Cardiac: Chest pain/pressure [ ] ; Resting SOB [ ] ; Exertional SOB [Y ]; Orthopnea [ ] ; Pedal Edema [ ] ; Palpitations [ ] ; Syncope [ ] ; Presyncope [ ] ; Paroxysmal nocturnal dyspnea[ ]   Pulmonary: Cough [ ] ; Wheezing[ ] ; Hemoptysis[ ] ; Sputum [ ] ; Snoring [ ]   GI: Vomiting[ ] ; Dysphagia[ ] ; Melena[ ] ; Hematochezia [ ] ; Heartburn[ ] ; Abdominal pain [ ] ; Constipation [ ] ; Diarrhea [ ] ; BRBPR [ ]   GU: Hematuria[ ] ; Dysuria [ ] ; Nocturia[ ]   Vascular: Pain in legs with walking [ ] ; Pain in feet with lying flat [ ] ; Non-healing sores [ ] ;  Stroke [ ] ; TIA [ ] ; Slurred speech [ ] ;  Neuro: Headaches[ ] ; Vertigo[ ] ; Seizures[ ] ; Paresthesias[ ] ;Blurred vision [ ] ; Diplopia [ ] ; Vision changes [ ]   Ortho/Skin: Arthritis [ ] ; Joint pain [ Y]; Muscle pain [ ] ; Joint swelling [ ] ; Back Pain [ ] ; Rash [ ]   Psych: Depression[ ] ; Anxiety[ ]   Heme: Bleeding problems [ ] ; Clotting disorders [ ] ; Anemia [ ]   Endocrine: Diabetes [ ] ; Thyroid dysfunction[ Y]  Home Medications Prior to Admission medications   Medication Sig Start Date End Date Taking? Authorizing Provider  allopurinol (ZYLOPRIM) 100 MG tablet Take 100 mg by  mouth daily.   Yes [provider]  amiodarone (PACERONE) 200 MG tablet Take 1 tablet (200 mg total) by mouth daily. 09/22/21  Yes Larey Dresser, MD  atorvastatin (LIPITOR) 40 MG tablet Take 1 tablet (40 mg total) by mouth at bedtime. 09/17/21  Yes Larey Dresser, MD  calcitRIOL (ROCALTROL) 0.25 MCG capsule Take 0.25 mcg by mouth daily.   Yes [provider]  Cholecalciferol (VITAMIN D3) 50 MCG (2000 UT) TABS Take 2,000 Units by mouth daily.   Yes [provider]  Cyanocobalamin (VITAMIN B12) 1000 MCG TBCR Take 1,000 mcg by mouth daily.   Yes [provider]  dapagliflozin propanediol (FARXIGA) 10 MG TABS tablet Take 1 tablet (10 mg total) by mouth daily. 07/03/21  Yes Larey Dresser, MD  DULoxetine (CYMBALTA) 60 MG capsule Take 60 mg by mouth at bedtime.   Yes [provider]  EPINEPHrine (EPI-PEN) 0.3 mg/0.3 mL DEVI Inject 0.3 mg into the muscle once as needed (severe allergic reaction).   Yes [provider]  ferrous sulfate 325 (65 FE) MG tablet Take 325 mg by mouth daily with breakfast.   Yes [provider]  fish oil-omega-3 fatty acids 1000 MG capsule Take 1 g by mouth 2 (two) times daily.   Yes [provider]  MAGNESIUM-OXIDE 400 (240 Mg) MG tablet Take 400 mg by mouth 2 (two) times daily. 05/04/21  Yes [provider]  melatonin 5 MG TABS Take 5 mg by mouth at bedtime.   Yes [provider]  metolazone (ZAROXOLYN) 5 MG tablet Take 1 tablet by mouth daily. 09/17/21  Yes [provider]  nitroGLYCERIN (NITROSTAT) 0.4 MG SL tablet Place 0.4 mg under the tongue every 5 (five) minutes as needed for chest pain.   Yes [provider]  potassium chloride SA (KLOR-CON) 20 MEQ tablet Take 2 tablets (40 mEq total) by mouth 3 (three) times daily. 08/04/21  Yes Larey Dresser, MD  predniSONE (DELTASONE) 5 MG tablet Take 1 tablet (5 mg total) by mouth daily. 12/29/16  Yes [name], MD   QUEtiapine (SEROQUEL) 50 MG tablet Take 150 mg by mouth at bedtime. 08/13/19  Yes [provider]  tacrolimus (PROGRAF) 0.5 MG capsule Take 0.5 mg by mouth See admin instructions. Takes 0.5 mg  tablet by mouth one day and 1 mg tablets next day (1 and 2 tablets on alternate days)   Yes [provider]  torsemide (DEMADEX) 20 MG tablet Take 4 tablets (80 mg total) by mouth 2 (two) times daily. 09/22/21  Yes Clegg, Amy D, NP  warfarin (COUMADIN) 4 MG tablet TAKE AS DIRECTED BY COUMADIN CLINIC Patient taking differently: Take 2-4 mg by mouth See admin instructions. Takes 4 mg tablet by mouth on Tuesdays and then take 2 mg tablet by mouth on all other days 09/19/20  Yes Deboraha Sprang, MD  zolpidem (AMBIEN) 5 MG tablet Take 5 mg by mouth at bedtime as needed for sleep. 07/03/21  Yes [provider]    Past Medical History: Past Medical History:  Diagnosis Date   Atrial fibrillation ([place]) 08/2009   CAD (coronary artery disease)    s/p CABG -- 1992   ESRD (end stage renal disease) ([place])    HLD (hyperlipidemia)    HTN (hypertension)    Idiopathic thrombocytopenia ([facility])     Past Surgical History: Past Surgical History:  Procedure Laterality Date   APPENDECTOMY     CORONARY ARTERY BYPASS GRAFT     FACIAL COSMETIC SURGERY     GALLBLADDER SURGERY     HERNIA REPAIR     KIDNEY TRANSPLANT     LEFT HEART CATH AND CORONARY ANGIOGRAPHY N/A 08/07/2018   Procedure: LEFT HEART CATH AND CORONARY ANGIOGRAPHY;  Surgeon: Sherren Mocha, MD;  Location: [facility];  Service: Cardiovascular;  Laterality: N/A;   LIVER TRANSPLANT     PRESSURE SENSOR/CARDIOMEMS N/A 08/04/2021   Procedure: PRESSURE SENSOR/CARDIOMEMS;  Surgeon: Larey Dresser, MD;  Location: [facility];  Service: Cardiovascular;  Laterality: N/A;   RIGHT HEART CATH N/A 06/25/2021   Procedure: RIGHT HEART CATH;  Surgeon: Larey Dresser, MD;  Location: [facility];  Service: Cardiovascular;   Laterality: N/A;    Family History: Family History  Problem Relation Age of Onset   Atrial fibrillation Mother    Breast cancer Mother    Renal cancer Father    Hypertension Brother    Hyperlipidemia Brother    Other Brother        stent    Renal Disease Maternal Grandmother    Stroke Maternal Grandmother    Heart attack Maternal Grandfather    Cancer Paternal Grandmother    Heart failure Paternal Grandfather    Emphysema Paternal Grandfather    Bronchiolitis Paternal Grandfather     Social History: Social History   Socioeconomic History   Marital status: Single    Spouse name: Not on file   Number of children: 0   Years of education: Not on file   Highest education level: Not on file  Occupational History   Occupation: retired  Tobacco Use   Smoking status: Never   Smokeless tobacco: Never  Substance and Sexual Activity   Alcohol use: No   Drug use: No   Sexual activity: Not on file  Other Topics Concern   Not on file  Social History Narrative   Not on file   Social Determinants of Health   Financial Resource Strain: Low Risk    Difficulty of Paying Living Expenses: Not very hard  Food Insecurity: No Food Insecurity   Worried About Charity fundraiser in the Last Year: Never true   Ran Out of Food in the Last Year: Never true  Transportation Needs: No Transportation Needs   Lack of Transportation (Medical): No   Lack of Transportation (Non-Medical): No  Physical Activity: Not on file  Stress: Not on file  Social Connections: Not on file    Allergies:  Allergies  Allergen Reactions   Other Anaphylaxis    Spider venom   Grapefruit Concentrate Other (See Comments)    Pt has been told not to eat grapefruit   Haloperidol Other (See Comments)    Caused the patient to be overly-sedated   Nsaids Other (See Comments)    Cannot take due to  liver transplant   Rifampin Other (See Comments)    Caused flushing   Triazolam Other (See Comments)    Caused the  patient to be overly-sedated    Objective:    Vital Signs:   Temp:  [97.4 F (36.3 C)-97.9 F (36.6 C)] 97.4 F (36.3 C) (11/21 1111) Pulse Rate:  [67-79] 67 (11/21 1111) Resp:  [15-23] 16 (11/21 1111) BP: (98-137)/(64-80) 117/77 (11/21 1111) SpO2:  [95 %-96 %] 96 % (11/21 1111) Weight:  [85.9 kg] 85.9 kg (11/21 0000) Last BM Date: 09/26/21  Weight change: Filed Weights   09/26/21 0601 09/27/21 0451 09/28/21 0000  Weight: 84.1 kg 85 kg 85.9 kg    Intake/Output:   Intake/Output Summary (Last 24 hours) at 09/28/2021 1318 Last data filed at 09/28/2021 0727 Gross per 24 hour  Intake 1241.49 ml  Output 1250 ml  Net -8.51 ml      Physical Exam    General:  In bed.  No resp difficulty HEENT: normal Neck: supple. JVP 6-7 . Carotids 2+ bilat; no bruits. No lymphadenopathy or thyromegaly appreciated. Cor: PMI nondisplaced. Irregular rate & rhythm. No rubs, gallops or murmurs. Lungs: clear on 1 liter [place]  Abdomen: soft, nontender, nondistended. No hepatosplenomegaly. No bruits or masses. Good bowel sounds. Extremities: no cyanosis, clubbing, rash, no lower extremity edema. Neuro: alert & orientedx3, cranial nerves grossly intact. moves all 4 extremities w/o difficulty. Affect pleasant   Telemetry    A Fib 60-70s personally reviewed.   EKG    A fib 119 on admit personally reviewed   Labs   Basic Metabolic Panel: Recent Labs  Lab 09/24/21 2052 09/25/21 0336 09/26/21 0102 09/27/21 0100 09/28/21 0045  NA 136 133* 136 137 139  K 4.2 4.8 4.2 3.7 4.0  CL 99 100 104 106 106  CO2 24 24 25 25 28   GLUCOSE 107* 121* 206* 222* 210*  BUN 23 24* 23 17 19   CREATININE 2.58* 2.47* 2.03* 1.53* 1.64*  CALCIUM 9.6 8.7* 8.9 9.0 8.9  MG 2.1 2.1 2.3 2.4 2.2  PHOS  --   --  3.1 3.1 2.5    Liver Function Tests: Recent Labs  Lab 09/24/21 2052  AST 23  ALT 19  ALKPHOS 91  BILITOT 1.9*  PROT 6.9  ALBUMIN 2.9*   Recent Labs  Lab 09/24/21 2052  LIPASE 29   No results  for input(s): AMMONIA in the last 168 hours.  CBC: Recent Labs  Lab 09/24/21 2052 09/25/21 0336 09/26/21 0102 09/27/21 0100 09/28/21 0045  WBC 11.8* 11.7* 6.0 5.2 4.6  NEUTROABS 10.6*  --   --   --   --   HGB 15.7 14.0 12.5* 11.6* 12.4*  HCT 50.4 47.1 40.4 37.2* 41.1  MCV 82.5 84.9 82.6 82.9 83.5  PLT 116* 98* 81* 86* 86*    Cardiac Enzymes: No results for input(s): CKTOTAL, CKMB, CKMBINDEX, TROPONINI in the last 168 hours.  BNP: BNP (last 3 results) Recent Labs    06/23/21 1707 07/16/21 1639 09/17/21 1430  BNP 186.0* 208.5* 163.9*    ProBNP (last 3 results) No results for input(s): PROBNP in the last 8760 hours.   CBG: Recent Labs  Lab 09/27/21 0615 09/27/21 1709 09/27/21 2057 09/28/21 0626 09/28/21 1113  GLUCAP 122* 169* 140* 159* 132*    Coagulation Studies: Recent Labs    09/26/21 0102 09/27/21 0100 09/28/21 0045  LABPROT 34.3* 30.0* 28.6*  INR 3.4* 2.9* 2.7*     Imaging   No results found.  Medications:     Current Medications:  amiodarone  200 mg Oral Daily   calcitRIOL  0.25 mcg Oral Daily   cholecalciferol  2,000 Units Oral Daily   dapagliflozin propanediol  10 mg Oral Daily   doxycycline  100 mg Oral Q12H   DULoxetine  60 mg Oral QHS   insulin aspart  0-5 Units Subcutaneous QHS   insulin aspart  0-9 Units Subcutaneous TID WC   magnesium oxide  400 mg Oral BID   melatonin  5 mg Oral QHS   pantoprazole (PROTONIX) IV  40 mg Intravenous QHS   predniSONE  5 mg Oral Daily   QUEtiapine  150 mg Oral QHS   tacrolimus  0.5 mg Oral QODAY   tacrolimus  1 mg Oral QODAY   torsemide  40 mg Oral BID   vitamin B-12  1,000 mcg Oral Daily   warfarin  2 mg Oral ONCE-1600   Warfarin - Pharmacist Dosing Inpatient   Does not apply q1600    Infusions:     Patient Profile   [name] is a 70 year old with history of CAD w/ prior MI s/p CABG in 5284, chronic systolic HF, VT, ICM, 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 [name], depression, chronic ITP. Has been on midodrine in past for hypotension.   Admitted with shock ? Sepsis.   Assessment/Plan   Shock--? Septic  ? CAP -Lactic Acid 2.3>1.5  -WBC 11.8> 4.6  - Fluid resuscitation.  - Bld Cx - NGTD -Urine Cx- multiple species  - Initially on IV antibiotics but now on doxycycline.   - Initially on  stress steroids but now back on home prednisone.   2. A/C HF mid range EF, ICM  Echo 06/2021 EF 45-50%  moderate LVH, RV normal, PASP 55 mmHg. PYP scan not suggestive of TTR cardiac amyloidosis - Had cardiomems placed 08/04/21  - Diuresed with IV lasix after fluid resuscitation.  -Volume status looks ok. Reds CLip 51% but does not look that overloaded. - Today given 20 mg torsemide and will start torsemide 40 mg twice a day this evening. - Continue farxiga 10 mg daily  - Renal function stable.   3. Acute Respiratory Failure -At home on room air. He does have a history of nocturnal desaturations.  - Requiring 1-2 liters oxygen. With ambulation oxygen saturations dropped to mid 80s and improved with 2 liters [place].    4. Permanent A fib  -Rate controlled. On chronic amio 200 mg for VT - on coumadin.  INR 2.7   5. CKD Stage IIIb -History of failed renal transplant in '97 followed by renal transplant in '08.   - Follows with Dr. Moshe Cipro,  - Creatinine on admit 2.6--->1.6 today  - On tacrolimus 1 mg  QOD with 0.5 mg on the other day.  - Back on home dose prednisone 5 mg daily.   6. H/O VT  - Followed by Dr [name]  -Has been on amio 200 mg daily many years.  - TSH and LFTS stable this admit.   7. Suspected Sleep apnea  Needs sleep study.    REDS Clip 51%. But on exam he does not appear volume overloaded.   [place] for d/c  Length of Stay: [place], NP  09/28/2021, 1:18 PM  Advanced Heart Failure Team Pager [phone] (M-F; 7a - 5p)  Please contact  [facility] for night-coverage after hours (4p -7a ) and weekends on [email or web address]  Patient seen and examined with the above-signed Advanced Practice Provider and/or Housestaff. I personally reviewed laboratory data, imaging studies and relevant notes. I independently examined the patient and formulated the important aspects of the plan. I have edited the note to reflect any of my changes or salient points. I have personally discussed the plan with the patient and/or family.  70 y/o male with CAD s/p CABG, remote liver/kidney tx, HFmrEF 45-50% admitted with septic shock of unclear etiology with AKI. Has been fluid resuscitated and treated with abx. Now ready yo go home. We are consulted to help with diuretic dosing  Denies CP or SOB currently BP stable. Weight 189. Baseline 183-188.   General:  Sitting up in bed  No resp difficulty HEENT: normal Neck: supple. no JVD. Carotids 2+ bilat; no bruits. No lymphadenopathy or thryomegaly appreciated. Cor: PMI nondisplaced. Regular rate & rhythm. No rubs, gallops or murmurs. Lungs: clear x crackles in RLL Abdomen: soft, nontender, nondistended. No hepatosplenomegaly. No bruits or masses. Good bowel sounds. Extremities: no cyanosis, clubbing, rash, edema Neuro: alert & orientedx3, cranial nerves grossly intact. moves all 4 extremities w/o difficulty. Affect pleasant  Volume status looks good currently Was previously on torsemide 80 bid with metolazone daily.  Would d/c on torsemide 80 daily. If weight going up would increase to 80 bid. Add back metolazone on an add needed basis. We will arrange to see him for f/u in HF Clinic.  [place] for d/c from our standpoint. D/w Dr. Eliseo Squires.   Glori Bickers, MD  3:03 PM

## 2021-09-28 NOTE — Progress Notes (Signed)
Mobility Specialist Progress Note    09/28/21 1203  Mobility  Activity Ambulated in hall  Level of Assistance Independent  Assistive Device None  Distance Ambulated (ft) 270 ft  Mobility Ambulated independently in hallway  Mobility Response Tolerated well  Mobility performed by Mobility specialist  $Mobility charge 1 Mobility   Pre-Mobility: 89% SpO2 During Mobility: 81% SpO2 Post-Mobility: 75 HR, 90% SpO2  Pt received in bed and agreeable. No complaints on walk. Could hear pt breathing heavy at an increased rate on walk. Returned to bed with call bell in reach.   [facility] Mobility Specialist  M.S. Primary Phone: [phone] M.S. Secondary Phone: [phone]

## 2021-09-28 NOTE — Progress Notes (Signed)
Progress Note    [name]  DSK:[id number] DOB: [birth year]  DOA: 09/24/2021 PCP: [name]., MD    Brief Narrative:   Medical records reviewed and are as summarized below:  [name] is an 70 y.o. 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  He also reported some nausea (without emesis), which has apparently improved since his ER stay.  Tx from PCCM on 11/19.  Have resumed diuresis for this patient but I am limited by his hypotension.  Suspect he is slightly volume up-- will ask CHF team to eval.   Assessment/Plan:   Principal Problem:   Hypotension Active Problems:   Long term (current) use of anticoagulants [Z79.01]   Atrial fibrillation (HCC)   Sepsis ([place])   History of liver transplant ([place])   Acute renal failure superimposed on stage 3 chronic kidney disease (HCC)   Chronic renal allograft nephropathy   Dehydration   Shock ([place])   Shock suspicion for hypovolemic and less likely septic; cultures NGTD; flu/COVID neg -being treated for suspected PNA -BP improved but still on lower end (off of home meds) -wean to home dose steroids -NP swab negative   Chronic HF with mid range EF: Ischemic cardiomyopathy.  Echo in 8/22 with EF 45-50%, moderate LVH, RV normal, PASP 55 mmHg -CHF team consult for help with diuretics -not sure his initial weight is correct  Acute resp failure -hope to be able to wean as he is diuresed  S/p liver kidney transplant on tacro/prednisone -resume tacro  Afib/RVR improved -on coumadin -rate controlled on amio  Supratherapeutic INR  -pharmacy managing  -no sign of bleeding  AKI on CKD3  -baseline CR is 2  -stable   OOB/ambulate patient  Family Communication/Anticipated D/C date and plan/Code  Status   DVT prophylaxis: coumadin Code Status: Full Code.  Disposition Plan: Status is: Inpatient  Remains inpatient appropriate because: CHF team consult         Medical Consultants:   PCCM CHF team  Subjective:   No complaints-- normally sleeps in   Objective:    Vitals:   09/28/21 0000 09/28/21 0652 09/28/21 0734 09/28/21 1111  BP: 126/77 137/69 98/65 117/77  Pulse: 79 70 71 67  Resp: 20 (!) 23 15 16   Temp:  97.7 F (36.5 C)  (!) 97.4 F (36.3 C)  TempSrc:      SpO2: 95% 95% 96% 96%  Weight: 85.9 kg     Height:        Intake/Output Summary (Last 24 hours) at 09/28/2021 1215 Last data filed at 09/28/2021 6203 Gross per 24 hour  Intake 1241.49 ml  Output 1250 ml  Net -8.51 ml   Filed Weights   09/26/21 0601 09/27/21 0451 09/28/21 0000  Weight: 84.1 kg 85 kg 85.9 kg    Exam:   General: Appearance:     Overweight male in no acute distress     Lungs:     On [place], diminished breath sounds, respirations unlabored  Heart:    Normal heart rate.    MS:   All extremities are intact.    Neurologic:   Awake, alert, oriented x 3. No apparent focal neurological           defect.          Data Reviewed:  I have personally reviewed following labs and imaging studies:  Labs: Labs show the following:   Basic Metabolic Panel: Recent Labs  Lab 09/24/21 2052 09/25/21 0336 09/26/21 0102 09/27/21 0100 09/28/21 0045  NA 136 133* 136 137 139  K 4.2 4.8 4.2 3.7 4.0  CL 99 100 104 106 106  CO2 24 24 25 25 28   GLUCOSE 107* 121* 206* 222* 210*  BUN 23 24* 23 17 19   CREATININE 2.58* 2.47* 2.03* 1.53* 1.64*  CALCIUM 9.6 8.7* 8.9 9.0 8.9  MG 2.1 2.1 2.3 2.4 2.2  PHOS  --   --  3.1 3.1 2.5   GFR Estimated Creatinine Clearance: 44.5 mL/min (A) (by C-G formula based on SCr of 1.64 mg/dL (H)). Liver Function Tests: Recent Labs  Lab 09/24/21 2052  AST 23  ALT 19  ALKPHOS 91  BILITOT 1.9*  PROT 6.9  ALBUMIN 2.9*   Recent Labs  Lab 09/24/21 2052   LIPASE 29   No results for input(s): AMMONIA in the last 168 hours. Coagulation profile Recent Labs  Lab 09/23/21 0000 09/24/21 2052 09/26/21 0102 09/27/21 0100 09/28/21 0045  INR 4.1* 4.5* 3.4* 2.9* 2.7*    CBC: Recent Labs  Lab 09/24/21 2052 09/25/21 0336 09/26/21 0102 09/27/21 0100 09/28/21 0045  WBC 11.8* 11.7* 6.0 5.2 4.6  NEUTROABS 10.6*  --   --   --   --   HGB 15.7 14.0 12.5* 11.6* 12.4*  HCT 50.4 47.1 40.4 37.2* 41.1  MCV 82.5 84.9 82.6 82.9 83.5  PLT 116* 98* 81* 86* 86*   Cardiac Enzymes: No results for input(s): CKTOTAL, CKMB, CKMBINDEX, TROPONINI in the last 168 hours. BNP (last 3 results) No results for input(s): PROBNP in the last 8760 hours. CBG: Recent Labs  Lab 09/27/21 0615 09/27/21 1709 09/27/21 2057 09/28/21 0626 09/28/21 1113  GLUCAP 122* 169* 140* 159* 132*   D-Dimer: No results for input(s): DDIMER in the last 72 hours. Hgb A1c: No results for input(s): HGBA1C in the last 72 hours. Lipid Profile: No results for input(s): CHOL, HDL, LDLCALC, TRIG, CHOLHDL, LDLDIRECT in the last 72 hours. Thyroid function studies: No results for input(s): TSH, T4TOTAL, T3FREE, THYROIDAB in the last 72 hours.  Invalid input(s): FREET3  Anemia work up: No results for input(s): VITAMINB12, FOLATE, FERRITIN, TIBC, IRON, RETICCTPCT in the last 72 hours. Sepsis Labs: Recent Labs  Lab 09/24/21 2052 09/24/21 2252 09/25/21 0336 09/26/21 0102 09/27/21 0100 09/28/21 0045  WBC 11.8*  --  11.7* 6.0 5.2 4.6  LATICACIDVEN 2.3* 1.7  --   --   --   --     Microbiology Recent Results (from the past 240 hour(s))  Culture, blood (routine x 2)     Status: None (Preliminary result)   Collection Time: 09/24/21  8:14 PM   Specimen: BLOOD  Result Value Ref Range Status   Specimen Description BLOOD LEFT ANTECUBITAL  Final   Special Requests   Final    BOTTLES DRAWN AEROBIC AND ANAEROBIC Blood Culture adequate volume   Culture   Final    NO GROWTH 3  DAYS Performed at [facility], [place] [address], [place], [place] [address]    Report Status PENDING  Incomplete  Culture, blood (routine x 2)     Status: None (Preliminary result)   Collection Time: 09/24/21  8:57 PM   Specimen: BLOOD  Result Value Ref Range Status   Specimen Description BLOOD SITE NOT SPECIFIED  Final   Special Requests  Final    BOTTLES DRAWN AEROBIC AND ANAEROBIC Blood Culture adequate volume   Culture   Final    NO GROWTH 3 DAYS Performed at [facility], [place] [address] [place], [place] [address]    Report Status PENDING  Incomplete  Urine Culture     Status: Abnormal   Collection Time: 09/24/21  9:44 PM   Specimen: In/Out Cath Urine  Result Value Ref Range Status   Specimen Description IN/OUT CATH URINE  Final   Special Requests   Final    NONE Performed at [facility], [place] [address] [place], [place] [address]    Culture MULTIPLE SPECIES PRESENT, SUGGEST RECOLLECTION (A)  Final   Report Status 09/26/2021 FINAL  Final  Resp Panel by RT-PCR (Flu A&B, Covid)     Status: None   Collection Time: 09/24/21  9:46 PM   Specimen: Nasopharyngeal(NP) swabs in vial transport medium  Result Value Ref Range Status   SARS Coronavirus 2 by RT PCR NEGATIVE NEGATIVE Final    Comment: (NOTE) SARS-CoV-2 target nucleic acids are NOT DETECTED.  The SARS-CoV-2 RNA is generally detectable in upper respiratory specimens during the acute phase of infection. The lowest concentration of SARS-CoV-2 viral copies this assay can detect is 138 copies/mL. A negative result does not preclude SARS-Cov-2 infection and should not be used as the sole basis for treatment or other patient management decisions. A negative result may occur with  improper specimen collection/handling, submission of specimen other than nasopharyngeal swab, presence of viral mutation(s) within the areas targeted by this assay, and inadequate number of viral copies(<138 copies/mL). A negative  result must be combined with clinical observations, patient history, and epidemiological information. The expected result is Negative.  Fact Sheet for Patients:  EntrepreneurPulse.com.au  Fact Sheet for Healthcare Providers:  IncredibleEmployment.be  This test is no t yet approved or cleared by the [place] FDA and  has been authorized for detection and/or diagnosis of SARS-CoV-2 by FDA under an Emergency Use Authorization (EUA). This EUA will remain  in effect (meaning this test can be used) for the duration of the COVID-19 declaration under Section 564(b)(1) of the Act, 21 U.S.C.section 360bbb-3(b)(1), unless the authorization is terminated  or revoked sooner.       Influenza A by PCR NEGATIVE NEGATIVE Final   Influenza B by PCR NEGATIVE NEGATIVE Final    Comment: (NOTE) The Xpert Xpress SARS-CoV-2/FLU/RSV plus assay is intended as an aid in the diagnosis of influenza from Nasopharyngeal swab specimens and should not be used as a sole basis for treatment. Nasal washings and aspirates are unacceptable for Xpert Xpress SARS-CoV-2/FLU/RSV testing.  Fact Sheet for Patients: EntrepreneurPulse.com.au  Fact Sheet for Healthcare Providers: IncredibleEmployment.be  This test is not yet approved or cleared by the [place] FDA and has been authorized for detection and/or diagnosis of SARS-CoV-2 by FDA under an Emergency Use Authorization (EUA). This EUA will remain in effect (meaning this test can be used) for the duration of the COVID-19 declaration under Section 564(b)(1) of the Act, 21 U.S.C. section 360bbb-3(b)(1), unless the authorization is terminated or revoked.  Performed at [facility], [place] [address], [place], [place] [address]   Respiratory (~20 pathogens) panel by PCR     Status: None   Collection Time: 09/24/21  9:46 PM   Specimen: Nasopharyngeal Swab; Respiratory  Result Value  Ref Range Status   Adenovirus NOT DETECTED NOT DETECTED Final   Coronavirus 229E NOT DETECTED NOT  DETECTED Final    Comment: (NOTE) The Coronavirus on the Respiratory Panel, DOES NOT test for the novel  Coronavirus (2019 nCoV)    Coronavirus HKU1 NOT DETECTED NOT DETECTED Final   Coronavirus NL63 NOT DETECTED NOT DETECTED Final   Coronavirus OC43 NOT DETECTED NOT DETECTED Final   Metapneumovirus NOT DETECTED NOT DETECTED Final   Rhinovirus / Enterovirus NOT DETECTED NOT DETECTED Final   Influenza A NOT DETECTED NOT DETECTED Final   Influenza B NOT DETECTED NOT DETECTED Final   Parainfluenza Virus 1 NOT DETECTED NOT DETECTED Final   Parainfluenza Virus 2 NOT DETECTED NOT DETECTED Final   Parainfluenza Virus 3 NOT DETECTED NOT DETECTED Final   Parainfluenza Virus 4 NOT DETECTED NOT DETECTED Final   Respiratory Syncytial Virus NOT DETECTED NOT DETECTED Final   Bordetella pertussis NOT DETECTED NOT DETECTED Final   Bordetella Parapertussis NOT DETECTED NOT DETECTED Final   Chlamydophila pneumoniae NOT DETECTED NOT DETECTED Final   Mycoplasma pneumoniae NOT DETECTED NOT DETECTED Final    Comment: Performed at [facility], [place] [address] [place], [place] [address]  MRSA Next Gen by PCR, Nasal     Status: None   Collection Time: 09/25/21  8:31 PM   Specimen: Nasal Mucosa; Nasal Swab  Result Value Ref Range Status   MRSA by PCR Next Gen NOT DETECTED NOT DETECTED Final    Comment: (NOTE) The GeneXpert MRSA Assay (FDA approved for NASAL specimens only), is one component of a comprehensive MRSA colonization surveillance program. It is not intended to diagnose MRSA infection nor to guide or monitor treatment for MRSA infections. Test performance is not FDA approved in patients less than 23 years old. Performed at [facility], [place] [address], [place],  [address]     Procedures and diagnostic studies:  No results found.  Medications:    amiodarone  200 mg Oral  Daily   calcitRIOL  0.25 mcg Oral Daily   cholecalciferol  2,000 Units Oral Daily   dapagliflozin propanediol  10 mg Oral Daily   doxycycline  100 mg Oral Q12H   DULoxetine  60 mg Oral QHS   insulin aspart  0-5 Units Subcutaneous QHS   insulin aspart  0-9 Units Subcutaneous TID WC   magnesium oxide  400 mg Oral BID   melatonin  5 mg Oral QHS   pantoprazole (PROTONIX) IV  40 mg Intravenous QHS   predniSONE  5 mg Oral Daily   QUEtiapine  150 mg Oral QHS   tacrolimus  0.5 mg Oral QODAY   tacrolimus  1 mg Oral QODAY   torsemide  20 mg Oral Once   torsemide  40 mg Oral BID   vitamin B-12  1,000 mcg Oral Daily   warfarin  2 mg Oral ONCE-1600   Warfarin - Pharmacist Dosing Inpatient   Does not apply q1600   Continuous Infusions:     LOS: 3 days   Geradine Girt  [facility]   How to contact the [facility] Attending or Consulting provider [place] or covering provider during after hours [place], for this patient?  Check the care team in [facility] and look for a) attending/consulting [facility] provider listed and b) the [facility] team listed Log into [email or web address] and use [facility]'s universal password to access. If you do not have the password, please contact the hospital operator. Locate the [facility] provider you are looking for under [facility] and page to a number that you can  be directly reached. If you still have difficulty reaching the provider, please page the [facility] (Director on Call) for the Hospitalists listed on amion for assistance.  09/28/2021, 12:15 PM

## 2021-09-28 NOTE — Discharge Summary (Signed)
Physician Discharge Summary  [name] QHU:[id number] DOB: [birth year] DOA: 09/24/2021  PCP: [name]., MD  Admit date: 09/24/2021 Discharge date: 09/28/2021  Admitted From: home Discharge disposition: home   Recommendations for Outpatient Follow-Up:   Monitor volume status closely BMP 1 week   Discharge Diagnosis:   Principal Problem:   Hypotension Active Problems:   Long term (current) use of anticoagulants [Z79.01]   Atrial fibrillation (HCC)   Sepsis ([place])   History of liver transplant ([place])   Acute renal failure superimposed on stage 3 chronic kidney disease ([place])   Chronic renal allograft nephropathy   Dehydration   Shock ([place])    Discharge Condition: Improved.  Diet recommendation: Low sodium, heart healthy.  Wound care: None.  Code status: Full.   History of Present Illness:   This 70 y.o. immunocompromised (renal allograft and liver-kidney transplant), Caucasian male nonsmoker presented to the [facility] Emergency Department via EMS with complaints of chills and rigors that reportedly started about 12 hours ago.  He also reported some nausea (without emesis), which has apparently improved since his ER stay.  He reported some exertional dyspnea.  SpO2 on room air was noted to be 90%, which only improved to 95% on 4 LPM.  He was noted to be hypotensive and was given a "sepsis bolus" which failed to improve his blood pressure.  ER started norepinephrine, and PCCM was asked to admit for shock requiring vasopressor administration.  Of note, the patient has a history of atrial fibrillation for which he takes amiodarone.  He ran out of amiodarone 2-3 days ago.   Hospital Course by Problem:   Shock suspicion for hypovolemic and less likely septic; cultures NGTD; flu/COVID neg -being treated for suspected PNA -wean to home dose steroids -NP swab negative    Chronic HF with mid range EF: Ischemic cardiomyopathy.  Echo  in 8/22 with EF 45-50%, moderate LVH, RV normal, PASP 55 mmHg -CHF team consult for help with diuretics: Would d/c on torsemide 80 daily. If weight going up would increase to 80 bid. Add back metolazone on an add needed basis. We will arrange to see him for f/u in HF Clinic   Acute resp failure -with diuresis was weaned to RA   S/p liver kidney transplant on tacro/prednisone -resume tacro   Afib/RVR improved -on coumadin -rate controlled on amio   Supratherapeutic INR             -resolved, now in range  -resume home dose   AKI on CKD3             -baseline CR is 2             -stable      Medical Consultants:   CHF team PCCM   Discharge Exam:   Vitals:   09/28/21 0734 09/28/21 1111  BP: 98/65 117/77  Pulse: 71 67  Resp: 15 16  Temp:  (!) 97.4 F (36.3 C)  SpO2: 96% 96%   Vitals:   09/28/21 0000 09/28/21 0652 09/28/21 0734 09/28/21 1111  BP: 126/77 137/69 98/65 117/77  Pulse: 79 70 71 67  Resp: 20 (!) 23 15 16   Temp:  97.7 F (36.5 C)  (!) 97.4 F (36.3 C)  TempSrc:      SpO2: 95% 95% 96% 96%  Weight: 85.9 kg     Height:        General exam: Appears calm and comfortable.  The results of significant diagnostics from this hospitalization (including imaging, microbiology, ancillary and laboratory) are listed below for reference.     Procedures and Diagnostic Studies:   DG Chest Portable 1 View  Result Date: 09/24/2021 CLINICAL DATA:  Fever. EXAM: PORTABLE CHEST 1 VIEW COMPARISON:  September 17, 2021 FINDINGS: Multiple sternal wires are noted. Stable, diffusely increased interstitial lung markings are noted. There is stable elevation of the right hemidiaphragm. Mild linear atelectasis is seen within the mid right lung. There is no evidence of a pleural effusion or pneumothorax. The cardiac silhouette is enlarged and unchanged in size. The visualized skeletal structures are unremarkable. IMPRESSION: 1. Increased interstitial lung markings which are likely  chronic in nature. A superimposed component of interstitial edema cannot be excluded. 2. Stable cardiomegaly with mild mid right lung linear atelectasis. Electronically Signed   By: Virgina Norfolk M.D.   On: 09/24/2021 22:09   ECHOCARDIOGRAM COMPLETE  Result Date: 09/25/2021    ECHOCARDIOGRAM REPORT   Patient Name:   [name] [id number] Date of Exam: 09/25/2021 Medical Rec #:  [id number]            Height:       70.0 in Accession #:    [id number]           Weight:       175.0 lb Date of Birth:  [birth year]            BSA:          1.972 m Patient Age:    70 years             BP:           136/64 mmHg Patient Gender: M                    HR:           83 bpm. Exam Location:  Inpatient Procedure: 2D Echo, Cardiac Doppler and Color Doppler Indications:    r/o pericardial effusion  History:        Patient has prior history of Echocardiogram examinations, most                 recent 06/24/2021. CHF, CAD, Arrythmias:Atrial Fibrillation,                 Signs/Symptoms:Hypotension; Risk Factors:Hypertension.  Sonographer:    Glo Herring Referring Phys: [id number] SEONG-JOO Weber  1. Inferoseptal and basal to mid inferior and inferolateral hypokinesis. Left ventricular ejection fraction, by estimation, is 45 to 50%. The left ventricle has mildly decreased function. The left ventricle demonstrates regional wall motion abnormalities (see scoring diagram/findings for description). There is moderate concentric left ventricular hypertrophy. Left ventricular diastolic parameters are indeterminate. Elevated left ventricular end-diastolic pressure.  2. Right ventricular systolic function is normal. The right ventricular size is normal. There is moderately elevated pulmonary artery systolic pressure.  3. Left atrial size was severely dilated.  4. Right atrial size was mildly dilated.  5. The mitral valve is normal in structure. Trivial mitral valve regurgitation. No evidence of mitral stenosis.  6. The aortic valve  is tricuspid. Aortic valve regurgitation is mild. No aortic stenosis is present.  7. The inferior vena cava is normal in size with <50% respiratory variability, suggesting right atrial pressure of 8 mmHg. FINDINGS  Left Ventricle: Inferoseptal and basal to mid inferior and inferolateral hypokinesis. Left ventricular ejection fraction, by estimation, is 45 to 50%. The left ventricle has mildly decreased function.  The left ventricle demonstrates regional wall motion  abnormalities. The left ventricular internal cavity size was normal in size. There is moderate concentric left ventricular hypertrophy. Left ventricular diastolic parameters are indeterminate. Elevated left ventricular end-diastolic pressure. Right Ventricle: The right ventricular size is normal. No increase in right ventricular wall thickness. Right ventricular systolic function is normal. There is moderately elevated pulmonary artery systolic pressure. The tricuspid regurgitant velocity is 3.31 m/s, and with an assumed right atrial pressure of 8 mmHg, the estimated right ventricular systolic pressure is 83.3 mmHg. Left Atrium: Left atrial size was severely dilated. Right Atrium: Right atrial size was mildly dilated. Pericardium: There is no evidence of pericardial effusion. Mitral Valve: The mitral valve is normal in structure. Trivial mitral valve regurgitation. No evidence of mitral valve stenosis. Tricuspid Valve: The tricuspid valve is normal in structure. Tricuspid valve regurgitation is mild . No evidence of tricuspid stenosis. Aortic Valve: The aortic valve is tricuspid. Aortic valve regurgitation is mild. No aortic stenosis is present. Aortic valve mean gradient measures 6.0 mmHg. Aortic valve peak gradient measures 11.9 mmHg. Aortic valve area, by VTI measures 2.13 cm. Pulmonic Valve: The pulmonic valve was normal in structure. Pulmonic valve regurgitation is not visualized. No evidence of pulmonic stenosis. Aorta: The aortic root is normal in  size and structure. Venous: The inferior vena cava is normal in size with less than 50% respiratory variability, suggesting right atrial pressure of 8 mmHg. IAS/Shunts: No atrial level shunt detected by color flow Doppler.  LEFT VENTRICLE PLAX 2D LVIDd:         5.90 cm   Diastology LVIDs:         4.00 cm   LV e' medial:    6.13 cm/s LV PW:         1.50 cm   LV E/e' medial:  21.1 LV IVS:        1.32 cm   LV e' lateral:   7.80 cm/s LVOT diam:     2.00 cm   LV E/e' lateral: 16.6 LV SV:         69 LV SV Index:   35 LVOT Area:     3.14 cm  RIGHT VENTRICLE            IVC RV Basal diam:  3.70 cm    IVC diam: 1.80 cm RV S prime:     7.68 cm/s LEFT ATRIUM             Index        RIGHT ATRIUM           Index LA diam:        5.50 cm 2.79 cm/m   RA Area:     20.90 cm LA Vol (A2C):   88.0 ml 44.62 ml/m  RA Volume:   58.80 ml  29.82 ml/m LA Vol (A4C):   81.6 ml 41.38 ml/m LA Biplane Vol: 89.2 ml 45.23 ml/m  AORTIC VALVE                     PULMONIC VALVE AV Area (Vmax):    2.11 cm      PV Vmax:       1.07 m/s AV Area (Vmean):   2.11 cm      PV Peak grad:  4.6 mmHg AV Area (VTI):     2.13 cm AV Vmax:           172.33 cm/s AV Vmean:  116.000 cm/s AV VTI:            0.323 m AV Peak Grad:      11.9 mmHg AV Mean Grad:      6.0 mmHg LVOT Vmax:         116.00 cm/s LVOT Vmean:        77.933 cm/s LVOT VTI:          0.219 m LVOT/AV VTI ratio: 0.68  AORTA Ao Root diam: 3.30 cm Ao Asc diam:  3.30 cm MITRAL VALVE                TRICUSPID VALVE MV Area (PHT): 4.20 cm     TR Peak grad:   43.8 mmHg MV Decel Time: 181 msec     TR Vmax:        331.00 cm/s MV E velocity: 129.30 cm/s                             SHUNTS                             Systemic VTI:  0.22 m                             Systemic Diam: 2.00 cm Skeet Latch MD Electronically signed by Skeet Latch MD Signature Date/Time: 09/25/2021/12:15:36 PM    Final      Labs:   Basic Metabolic Panel: Recent Labs  Lab 09/24/21 2052 09/25/21 0336  09/26/21 0102 09/27/21 0100 09/28/21 0045  NA 136 133* 136 137 139  K 4.2 4.8 4.2 3.7 4.0  CL 99 100 104 106 106  CO2 24 24 25 25 28   GLUCOSE 107* 121* 206* 222* 210*  BUN 23 24* 23 17 19   CREATININE 2.58* 2.47* 2.03* 1.53* 1.64*  CALCIUM 9.6 8.7* 8.9 9.0 8.9  MG 2.1 2.1 2.3 2.4 2.2  PHOS  --   --  3.1 3.1 2.5   GFR Estimated Creatinine Clearance: 44.5 mL/min (A) (by C-G formula based on SCr of 1.64 mg/dL (H)). Liver Function Tests: Recent Labs  Lab 09/24/21 2052  AST 23  ALT 19  ALKPHOS 91  BILITOT 1.9*  PROT 6.9  ALBUMIN 2.9*   Recent Labs  Lab 09/24/21 2052  LIPASE 29   No results for input(s): AMMONIA in the last 168 hours. Coagulation profile Recent Labs  Lab 09/23/21 0000 09/24/21 2052 09/26/21 0102 09/27/21 0100 09/28/21 0045  INR 4.1* 4.5* 3.4* 2.9* 2.7*    CBC: Recent Labs  Lab 09/24/21 2052 09/25/21 0336 09/26/21 0102 09/27/21 0100 09/28/21 0045  WBC 11.8* 11.7* 6.0 5.2 4.6  NEUTROABS 10.6*  --   --   --   --   HGB 15.7 14.0 12.5* 11.6* 12.4*  HCT 50.4 47.1 40.4 37.2* 41.1  MCV 82.5 84.9 82.6 82.9 83.5  PLT 116* 98* 81* 86* 86*   Cardiac Enzymes: No results for input(s): CKTOTAL, CKMB, CKMBINDEX, TROPONINI in the last 168 hours. BNP: Invalid input(s): POCBNP CBG: Recent Labs  Lab 09/27/21 0615 09/27/21 1709 09/27/21 2057 09/28/21 0626 09/28/21 1113  GLUCAP 122* 169* 140* 159* 132*   D-Dimer No results for input(s): DDIMER in the last 72 hours. Hgb A1c No results for input(s): HGBA1C in the last 72 hours. Lipid Profile No results for input(s): CHOL, HDL, LDLCALC, TRIG, CHOLHDL,  LDLDIRECT in the last 72 hours. Thyroid function studies No results for input(s): TSH, T4TOTAL, T3FREE, THYROIDAB in the last 72 hours.  Invalid input(s): FREET3 Anemia work up No results for input(s): VITAMINB12, FOLATE, FERRITIN, TIBC, IRON, RETICCTPCT in the last 72 hours. Microbiology Recent Results (from the past 240 hour(s))  Culture, blood  (routine x 2)     Status: None (Preliminary result)   Collection Time: 09/24/21  8:14 PM   Specimen: BLOOD  Result Value Ref Range Status   Specimen Description BLOOD LEFT ANTECUBITAL  Final   Special Requests   Final    BOTTLES DRAWN AEROBIC AND ANAEROBIC Blood Culture adequate volume   Culture   Final    NO GROWTH 4 DAYS Performed at [facility], [address] [place], [place] [address]    Report Status PENDING  Incomplete  Culture, blood (routine x 2)     Status: None (Preliminary result)   Collection Time: 09/24/21  8:57 PM   Specimen: BLOOD  Result Value Ref Range Status   Specimen Description BLOOD SITE NOT SPECIFIED  Final   Special Requests   Final    BOTTLES DRAWN AEROBIC AND ANAEROBIC Blood Culture adequate volume   Culture   Final    NO GROWTH 4 DAYS Performed at [facility], [address], [place], [place] [address]    Report Status PENDING  Incomplete  Urine Culture     Status: Abnormal   Collection Time: 09/24/21  9:44 PM   Specimen: In/Out Cath Urine  Result Value Ref Range Status   Specimen Description IN/OUT CATH URINE  Final   Special Requests   Final    NONE Performed at [facility], [place] [address], [place], [place] [address]    Culture MULTIPLE SPECIES PRESENT, SUGGEST RECOLLECTION (A)  Final   Report Status 09/26/2021 FINAL  Final  Resp Panel by RT-PCR (Flu A&B, Covid)     Status: None   Collection Time: 09/24/21  9:46 PM   Specimen: Nasopharyngeal(NP) swabs in vial transport medium  Result Value Ref Range Status   SARS Coronavirus 2 by RT PCR NEGATIVE NEGATIVE Final    Comment: (NOTE) SARS-CoV-2 target nucleic acids are NOT DETECTED.  The SARS-CoV-2 RNA is generally detectable in upper respiratory specimens during the acute phase of infection. The lowest concentration of SARS-CoV-2 viral copies this assay can detect is 138 copies/mL. A negative result does not preclude SARS-Cov-2 infection and should not be used as the sole  basis for treatment or other patient management decisions. A negative result may occur with  improper specimen collection/handling, submission of specimen other than nasopharyngeal swab, presence of viral mutation(s) within the areas targeted by this assay, and inadequate number of viral copies(<138 copies/mL). A negative result must be combined with clinical observations, patient history, and epidemiological information. The expected result is Negative.  Fact Sheet for Patients:  EntrepreneurPulse.com.au  Fact Sheet for Healthcare Providers:  IncredibleEmployment.be  This test is no t yet approved or cleared by the [place] FDA and  has been authorized for detection and/or diagnosis of SARS-CoV-2 by FDA under an Emergency Use Authorization (EUA). This EUA will remain  in effect (meaning this test can be used) for the duration of the COVID-19 declaration under Section 564(b)(1) of the Act, 21 U.S.C.section 360bbb-3(b)(1), unless the authorization is terminated  or revoked sooner.       Influenza A by PCR NEGATIVE NEGATIVE Final   Influenza B by PCR  NEGATIVE NEGATIVE Final    Comment: (NOTE) The Xpert Xpress SARS-CoV-2/FLU/RSV plus assay is intended as an aid in the diagnosis of influenza from Nasopharyngeal swab specimens and should not be used as a sole basis for treatment. Nasal washings and aspirates are unacceptable for Xpert Xpress SARS-CoV-2/FLU/RSV testing.  Fact Sheet for Patients: EntrepreneurPulse.com.au  Fact Sheet for Healthcare Providers: IncredibleEmployment.be  This test is not yet approved or cleared by the [place] FDA and has been authorized for detection and/or diagnosis of SARS-CoV-2 by FDA under an Emergency Use Authorization (EUA). This EUA will remain in effect (meaning this test can be used) for the duration of the COVID-19 declaration under Section 564(b)(1) of the Act,  21 U.S.C. section 360bbb-3(b)(1), unless the authorization is terminated or revoked.  Performed at [facility], [place] [address], [place], [place] [address]   Respiratory (~20 pathogens) panel by PCR     Status: None   Collection Time: 09/24/21  9:46 PM   Specimen: Nasopharyngeal Swab; Respiratory  Result Value Ref Range Status   Adenovirus NOT DETECTED NOT DETECTED Final   Coronavirus 229E NOT DETECTED NOT DETECTED Final    Comment: (NOTE) The Coronavirus on the Respiratory Panel, DOES NOT test for the novel  Coronavirus (2019 nCoV)    Coronavirus HKU1 NOT DETECTED NOT DETECTED Final   Coronavirus NL63 NOT DETECTED NOT DETECTED Final   Coronavirus OC43 NOT DETECTED NOT DETECTED Final   Metapneumovirus NOT DETECTED NOT DETECTED Final   Rhinovirus / Enterovirus NOT DETECTED NOT DETECTED Final   Influenza A NOT DETECTED NOT DETECTED Final   Influenza B NOT DETECTED NOT DETECTED Final   Parainfluenza Virus 1 NOT DETECTED NOT DETECTED Final   Parainfluenza Virus 2 NOT DETECTED NOT DETECTED Final   Parainfluenza Virus 3 NOT DETECTED NOT DETECTED Final   Parainfluenza Virus 4 NOT DETECTED NOT DETECTED Final   Respiratory Syncytial Virus NOT DETECTED NOT DETECTED Final   Bordetella pertussis NOT DETECTED NOT DETECTED Final   Bordetella Parapertussis NOT DETECTED NOT DETECTED Final   Chlamydophila pneumoniae NOT DETECTED NOT DETECTED Final   Mycoplasma pneumoniae NOT DETECTED NOT DETECTED Final    Comment: Performed at [facility], [place], [place], [place] [address]  MRSA Next Gen by PCR, Nasal     Status: None   Collection Time: 09/25/21  8:31 PM   Specimen: Nasal Mucosa; Nasal Swab  Result Value Ref Range Status   MRSA by PCR Next Gen NOT DETECTED NOT DETECTED Final    Comment: (NOTE) The GeneXpert MRSA Assay (FDA approved for NASAL specimens only), is one component of a comprehensive MRSA colonization surveillance program. It is not intended to diagnose MRSA  infection nor to guide or monitor treatment for MRSA infections. Test performance is not FDA approved in patients less than 57 years old. Performed at [facility], [place] [address], [place], [place] [address]      Discharge Instructions:   Discharge Instructions     (HEART FAILURE PATIENTS) Call MD:  Anytime you have any of the following symptoms: 1) 3 pound weight gain in 24 hours or 5 pounds in 1 week 2) shortness of breath, with or without a dry hacking cough 3) swelling in the hands, feet or stomach 4) if you have to sleep on extra pillows at night in order to breathe.   Complete by: As directed    Diet - low sodium heart healthy   Complete by: As directed  Discharge instructions   Complete by: As directed    torsemide 80 mg daily and can take extra 80 mg for 3 pound weight gain in 24 hours   Heart Failure patients record your daily weight using the same scale at the same time of day   Complete by: As directed    Increase activity slowly   Complete by: As directed       Allergies as of 09/28/2021       Reactions   Other Anaphylaxis   Spider venom   Grapefruit Concentrate Other (See Comments)   Pt has been told not to eat grapefruit   Haloperidol Other (See Comments)   Caused the patient to be overly-sedated   Nsaids Other (See Comments)   Cannot take due to liver transplant   Rifampin Other (See Comments)   Caused flushing   Triazolam Other (See Comments)   Caused the patient to be overly-sedated        Medication List     STOP taking these medications    metolazone 5 MG tablet Commonly known as: ZAROXOLYN       TAKE these medications    allopurinol 100 MG tablet Commonly known as: ZYLOPRIM Take 100 mg by mouth daily.   amiodarone 200 MG tablet Commonly known as: PACERONE Take 1 tablet (200 mg total) by mouth daily.   atorvastatin 40 MG tablet Commonly known as: LIPITOR Take 1 tablet (40 mg total) by mouth at bedtime.   calcitRIOL 0.25  MCG capsule Commonly known as: ROCALTROL Take 0.25 mcg by mouth daily.   dapagliflozin propanediol 10 MG Tabs tablet Commonly known as: FARXIGA Take 1 tablet (10 mg total) by mouth daily.   doxycycline 100 MG tablet Commonly known as: VIBRA-TABS Take 1 tablet (100 mg total) by mouth every 12 (twelve) hours.   DULoxetine 60 MG capsule Commonly known as: CYMBALTA Take 60 mg by mouth at bedtime.   EPINEPHrine 0.3 mg/0.3 mL Devi Commonly known as: EPI-PEN Inject 0.3 mg into the muscle once as needed (severe allergic reaction).   ferrous sulfate 325 (65 FE) MG tablet Take 325 mg by mouth daily with breakfast.   fish oil-omega-3 fatty acids 1000 MG capsule Take 1 g by mouth 2 (two) times daily.   MAGnesium-Oxide 400 (240 Mg) MG tablet Generic drug: magnesium oxide Take 400 mg by mouth 2 (two) times daily.   melatonin 5 MG Tabs Take 5 mg by mouth at bedtime.   nitroGLYCERIN 0.4 MG SL tablet Commonly known as: NITROSTAT Place 0.4 mg under the tongue every 5 (five) minutes as needed for chest pain.   potassium chloride SA 20 MEQ tablet Commonly known as: KLOR-CON Take 2 tablets (40 mEq total) by mouth 3 (three) times daily.   predniSONE 5 MG tablet Commonly known as: DELTASONE Take 1 tablet (5 mg total) by mouth daily.   QUEtiapine 50 MG tablet Commonly known as: SEROQUEL Take 150 mg by mouth at bedtime.   tacrolimus 0.5 MG capsule Commonly known as: PROGRAF Take 0.5 mg by mouth See admin instructions. Takes 0.5 mg  tablet by mouth one day and 1 mg tablets next day (1 and 2 tablets on alternate days)   torsemide 20 MG tablet Commonly known as: Demadex Take 4 tablets (80 mg total) by mouth daily. What changed: when to take this   Vitamin B12 1000 MCG Tbcr Take 1,000 mcg by mouth daily.   Vitamin D3 50 MCG (2000 UT) Tabs Take 2,000 Units by mouth  daily.   warfarin 4 MG tablet Commonly known as: COUMADIN Take as directed. If you are unsure how to take this  medication, talk to your nurse or doctor. Original instructions: TAKE AS DIRECTED BY COUMADIN CLINIC What changed: See the new instructions.   zolpidem 5 MG tablet Commonly known as: AMBIEN Take 5 mg by mouth at bedtime as needed for sleep.               Durable Medical Equipment  (From admission, onward)           Start     Ordered   09/28/21 1500  For home use only DME oxygen  Once       Question Answer Comment  Length of Need Lifetime   Mode or (Route) Nasal cannula   Liters per Minute 2   Frequency Continuous (stationary and portable oxygen unit needed)   Oxygen delivery system Gas      09/28/21 1459            Follow-up Information      [facility] Follow up on 10/05/2021.   Specialty: Cardiology Why: At 2:00 Contact information: [address] [id number] [place] [phone]                 Time coordinating discharge: 35 min  Signed:  Geradine Girt DO  [facility] 09/28/2021, 3:19 PM

## 2021-09-29 LAB — CULTURE, BLOOD (ROUTINE X 2)
Culture: NO GROWTH
Culture: NO GROWTH
Special Requests: ADEQUATE
Special Requests: ADEQUATE

## 2021-09-30 ENCOUNTER — Ambulatory Visit (INDEPENDENT_AMBULATORY_CARE_PROVIDER_SITE_OTHER): Payer: Medicare Other

## 2021-09-30 DIAGNOSIS — Z5181 Encounter for therapeutic drug level monitoring: Secondary | ICD-10-CM | POA: Diagnosis not present

## 2021-09-30 DIAGNOSIS — I4821 Permanent atrial fibrillation: Secondary | ICD-10-CM | POA: Diagnosis not present

## 2021-09-30 LAB — POCT INR: INR: 3.6 — AB (ref 2.0–3.0)

## 2021-09-30 NOTE — Patient Instructions (Signed)
Description   Spoke with pt, instructed to hold today's dose then continue on same dosage of Warfarin 2mg  daily except 4mg  on Tuesdays. Remain consistent with your green leafy vegetables. Recheck INR 1 week. Weekly Personal assistant for insurance purposes. [phone]

## 2021-10-01 NOTE — Procedures (Signed)
   Sleep Study Report  Patient Information Study Date: 09/24/21 Patient Name: [name] Patient ID: [id number] Birth Date: Age:70 Gender: Male Referring Physician:Daniel Bensimhon, MD  TEST DESCRIPTION: Home sleep apnea testing was completed using the WatchPat, a Type 1 device, utilizing peripheral arterial tonometry (PAT), chest movement, actigraphy, pulse oximetry, pulse rate, body position and snore. AHI was calculated with apnea and hypopnea using valid sleep time as the denominator. RDI includes apneas, hypopneas, and RERAs. The data acquired and the scoring of sleep and all associated events were performed in accordance with the recommended standards and specifications as outlined in the AASM Manual for the Scoring of Sleep and Associated Events 2.2.0 (2015).  FINDINGS: 1. Moderate Obstructive Sleep Apnea with AHI 18.4/hr. 2. No Central Sleep Apnea with pAHIc 1.1/hr. 3. Oxygen desaturations as low as 72 . 4. Mild snoring was present. O2 sats were < 88% for 33.2 min. 5. Total sleep time was 4 hrs and 37 min. 6. 16.7% of total sleep time was spent in REM sleep. 7. Prolonged sleep onset latency at 36 min 8. Prolonged REM sleep onset latency at 101 min. 9. Total awakenings were12 .  DIAGNOSIS: Moderate Obstructive Sleep Apnea (G47.33) Nocturnal Hypoxemia  RECOMMENDATIONS: 1. Clinical correlation of these findings is necessary. The decision to treat obstructive sleep apnea (OSA) is usually based on the presence of apnea symptoms or the presence of associated medical conditions such as Hypertension, Congestive Heart Failure, Atrial Fibrillation or Obesity. The most common symptoms of OSA are snoring, gasping for breath while sleeping, daytime sleepiness and fatigue.  2. Initiating apnea therapy is recommended given the presence of symptoms and/or associated conditions. Recommend proceeding with one of the following:   a. Auto-CPAP therapy with a pressure range of  5-20cm H2O.   b. An oral appliance (OA) that can be obtained from certain dentists with expertise in sleep medicine. These are primarily of use in non-obese patients with mild and moderate disease.   c. An ENT consultation which may be useful to look for specific causes of obstruction and possible treatment options.   d. If patient is intolerant to PAP therapy, consider referral to ENT for evaluation for hypoglossal nerve stimulator.  3. Close follow-up is necessary to ensure success with CPAP or oral appliance therapy for maximum benefit .  4. A follow-up oximetry study on CPAP is recommended to assess the adequacy of therapy and determine the need for supplemental oxygen or the potential need for Bi-level therapy. An arterial blood gas to determine the adequacy of baseline ventilation and oxygenation should also be considered.  5. Healthy sleep recommendations include: adequate nightly sleep (normal 7-9 hrs/night), avoidance of caffeine after noon and alcohol near bedtime, and maintaining a sleep environment that is cool, dark and quiet.  6. Weight loss for overweight patients is recommended. Even modest amounts of weight loss can significantly improve the severity of sleep apnea.  7. Snoring recommendations include: weight loss where appropriate, side sleeping, and avoidance of alcohol before bed.  8. Operation of motor vehicle should not be performed when sleepy.  Signature: Electronically Signed: 10/02/21 Fransico Him, MD; [facility]; [place], American Board of Sleep Medicine

## 2021-10-02 LAB — PROTIME-INR

## 2021-10-04 ENCOUNTER — Ambulatory Visit: Payer: Medicare Other

## 2021-10-04 DIAGNOSIS — G4719 Other hypersomnia: Secondary | ICD-10-CM

## 2021-10-05 ENCOUNTER — Encounter (HOSPITAL_COMMUNITY): Payer: Self-pay

## 2021-10-05 ENCOUNTER — Ambulatory Visit (HOSPITAL_COMMUNITY)
Admission: RE | Admit: 2021-10-05 | Discharge: 2021-10-05 | Disposition: A | Discharge status: Home / Self Care | Payer: Medicare Other | Source: Ambulatory Visit | Attending: Family Medicine | Admitting: Family Medicine

## 2021-10-05 ENCOUNTER — Other Ambulatory Visit: Payer: Self-pay

## 2021-10-05 VITALS — BP 110/68 | HR 93 | Wt 186.8 lb

## 2021-10-05 DIAGNOSIS — N183 Chronic kidney disease, stage 3 unspecified: Secondary | ICD-10-CM | POA: Diagnosis not present

## 2021-10-05 DIAGNOSIS — I255 Ischemic cardiomyopathy: Secondary | ICD-10-CM | POA: Insufficient documentation

## 2021-10-05 DIAGNOSIS — N1832 Chronic kidney disease, stage 3b: Secondary | ICD-10-CM | POA: Insufficient documentation

## 2021-10-05 DIAGNOSIS — Z944 Liver transplant status: Secondary | ICD-10-CM | POA: Insufficient documentation

## 2021-10-05 DIAGNOSIS — I2510 Atherosclerotic heart disease of native coronary artery without angina pectoris: Secondary | ICD-10-CM | POA: Insufficient documentation

## 2021-10-05 DIAGNOSIS — I5022 Chronic systolic (congestive) heart failure: Secondary | ICD-10-CM | POA: Insufficient documentation

## 2021-10-05 DIAGNOSIS — G4733 Obstructive sleep apnea (adult) (pediatric): Secondary | ICD-10-CM | POA: Diagnosis not present

## 2021-10-05 DIAGNOSIS — I472 Ventricular tachycardia, unspecified: Secondary | ICD-10-CM

## 2021-10-05 DIAGNOSIS — I4821 Permanent atrial fibrillation: Secondary | ICD-10-CM | POA: Insufficient documentation

## 2021-10-05 DIAGNOSIS — Z940 Kidney transplant status: Secondary | ICD-10-CM | POA: Diagnosis not present

## 2021-10-05 DIAGNOSIS — Z7901 Long term (current) use of anticoagulants: Secondary | ICD-10-CM | POA: Diagnosis not present

## 2021-10-05 DIAGNOSIS — I25810 Atherosclerosis of coronary artery bypass graft(s) without angina pectoris: Secondary | ICD-10-CM

## 2021-10-05 LAB — BASIC METABOLIC PANEL
Anion gap: 7 (ref 5–15)
BUN: 21 mg/dL (ref 8–23)
CO2: 28 mmol/L (ref 22–32)
Calcium: 9.8 mg/dL (ref 8.9–10.3)
Chloride: 102 mmol/L (ref 98–111)
Creatinine, Ser: 2.14 mg/dL — ABNORMAL HIGH (ref 0.61–1.24)
GFR, Estimated: 33 mL/min — ABNORMAL LOW (ref >60)
Glucose, Bld: 153 mg/dL — ABNORMAL HIGH (ref 70–99)
Potassium: 5 mmol/L (ref 3.5–5.1)
Sodium: 137 mmol/L (ref 135–145)

## 2021-10-05 LAB — BRAIN NATRIURETIC PEPTIDE: B Natriuretic Peptide: 223.7 pg/mL — ABNORMAL HIGH (ref 0.0–100.0)

## 2021-10-05 MED ORDER — TORSEMIDE 20 MG PO TABS: 100.0000 mg | ORAL_TABLET | Freq: Every day | ORAL | 6 refills | Status: AC

## 2021-10-05 MED ORDER — AMIODARONE HCL 200 MG PO TABS: 200.0000 mg | ORAL_TABLET | Freq: Every day | ORAL | 5 refills | Status: AC

## 2021-10-05 NOTE — Progress Notes (Signed)
[name], MD Heme/Onc: Dr. Hosie Poisson HF Cardiologist: Dr. Aundra Dubin  HPI: [name] 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 [name], depression, chronic ITP. Has been on midodrine in past for hypotension.    He has been a DNR. Reports poor quality of life for several years. Weak and dyspnea with minimal activity.  Had multiple admissions for acute on chronic CHF.   Patient admitted to [place] at end of June 2022 with acute on chronic CHF. Diuresed with IV lasix. Sent home on prior home dose furosemide at 40 mg BID.  Metolazone was stopped.  Echo with EF 45%. Coreg held d/t hypotension and started on Toprol xl at D/C. Not on SGLT2i due to cost. Initiated on losartan.    Seen in ED 07/22 with lightheadedness and hypotension. No orthostasis. Losartan and metoprolol held.   Has multiple dental caries treated along with several extractions at [facility] 06/09/21.   He was seen in clinic by Dr. [name] 08/22 and appeared volume overloaded. Short of breath with 10# lb weight gain. Had restarted metolazone on his own a week prior. Admission recommended for diuresis and possible inotropic support. Midodrine held since blood pressure okay. Diuresed with IV lasix + metolazone. AHF consulted for further management. Echo with EF 45-50%, multiple WMA, RV okay, PASP 55 mmHg, LA severely dilated, mild TR, trial MR. Concern for cardiac amyloid with LVH and he underwent PYP scan which was not suggestive of TTR cardiac amyloidosis. RHC showed elevated right and left filling pressures; RA mean 13, RV 53/15, PA 63/23, mean 39, PCWP mean 20, CO/CI 6.3/3.07, PVR 3 WU. Transitioned to po torsemide at discharge.  S/p Cardiomems implant 08/04/21. Elevated R/L filling pressures, torsemide  increased to 60/40 mg daily.  Last seen for f/u 09/17/21. Appeared volume up. PADP 22 (goal 25), goal lowered to 18. Torsemide increased to 80 qam/60 qpm. Home sleep study arranged which demonstrated moderate OSA and nocturnal hypoxemia. CXR concerning for pulmonary edema versus pneumonia.  Torsemide increased to 80 mg BID on 11/15 d/t PADP 25.   Admitted 09/24/21 with septic shock of unclear etiology and AKI. He was fluid resuscitated and treated with abx. Diuresed with IV lasix. Placed on 80 mg Torsemide daily at discharge.   Today he returns for HF follow up. He feels like he is fluid overloaded, he is SOB walking on flat ground. Does have left arm pain and has taken ibuprofen and aleve for the past day with some relief. Denies CP, dizziness, edema, or PND/Orthopnea. Appetite ok. No fever or chills. Weight at home 180 pounds, baseline is 183-188 lbs. Taking all medications. Has not taken amiodarone in about a week. He has been drinking more fluids since being home from hospital.  ECG (personally reviewed): atrial fibrillation 88 bpm.  Cardiomems: dPAP today 27 (goal 18)  REDS clip 37%  Labs (10/22): LDL 82, HDL 37, K 3.8, creatinine 2.08 Labs (11/22): K 4.0, creatinine 1.64  PMH: 1. H/o VT: Has refused ICD. 2. Chronic ITP 3. Hyperlipidemia 4. ESRD s/p renal transplant 2008, now off HD.  5. CAD: CABG 1992.  - LHC (9/19): total occlusion of LAD, LCx, RCA.  LIMA-LAD, SVG-ramus, SVG-PDA patent.  Occlusion of SVG-OM1.  Moderate stenosis SVG-ramus, treated medically.  6. MGUS 7. Chronic HF with mid range EF: Ischemic cardiomyopathy.  - PYP scan 8/22: grade 1, H/CL 1.0 => not suggestive of TTR cardiac amyloidoisis.  - RHC (8/22): mean RA 13, PA 63/23 mean 39, mean PCWP 20, CI 3.07, PVR 3 WU.  - Echo (8/22): EF 45-50%, inferoseptal/inferior/inferolateral hypokinesis, normal RV.  - Cardiomems 9/22 8. Liver transplant 2008.  9. Atrial fibrillation: Permanent.  10. Depression  Current  Outpatient Medications  Medication Sig Dispense Refill   allopurinol (ZYLOPRIM) 100 MG tablet Take 100 mg by mouth daily.     amiodarone (PACERONE) 200 MG tablet Take 1 tablet (200 mg total) by mouth daily. 30 tablet 11   atorvastatin (LIPITOR) 40 MG tablet Take 1 tablet (40 mg total) by mouth at bedtime. 90 tablet 3   calcitRIOL (ROCALTROL) 0.25 MCG capsule Take 0.25 mcg by mouth daily.     Cholecalciferol (VITAMIN D3) 50 MCG (2000 UT) TABS Take 2,000 Units by mouth daily.     Cyanocobalamin (VITAMIN B12) 1000 MCG TBCR Take 1,000 mcg by mouth daily.     dapagliflozin propanediol (FARXIGA) 10 MG TABS tablet Take 1 tablet (10 mg total) by mouth daily. 30 tablet 6   DULoxetine (CYMBALTA) 60 MG capsule Take 60 mg by mouth at bedtime.     EPINEPHrine (EPI-PEN) 0.3 mg/0.3 mL DEVI Inject 0.3 mg into the muscle once as needed (severe allergic reaction).     ferrous sulfate 325 (65 FE) MG tablet Take 325 mg by mouth daily with breakfast.     fish oil-omega-3 fatty acids 1000 MG capsule Take 1 g by mouth 2 (two) times daily.     MAGNESIUM-OXIDE 400 (240 Mg) MG tablet Take 400 mg by mouth 2 (two) times daily.     melatonin 5 MG TABS Take 5 mg by mouth at bedtime.     nitroGLYCERIN (NITROSTAT) 0.4 MG SL tablet Place 0.4 mg under the tongue every 5 (five) minutes as needed for chest pain.     potassium chloride SA (KLOR-CON) 20 MEQ tablet Take 2 tablets (40 mEq total) by mouth 3 (three) times daily. 90 tablet 6   predniSONE (DELTASONE) 5 MG tablet Take 1 tablet (5 mg total) by mouth daily.     QUEtiapine (SEROQUEL) 50 MG tablet Take 150 mg by mouth at bedtime.     tacrolimus (PROGRAF) 0.5 MG capsule Take 0.5 mg by mouth See admin instructions. Takes 0.5 mg  tablet by mouth one day and 1 mg tablets next day (1 and 2 tablets on alternate days)     torsemide (DEMADEX) 20 MG tablet Take 4 tablets (80 mg total) by mouth daily. 210 tablet 6   warfarin (COUMADIN) 4 MG tablet TAKE AS DIRECTED BY COUMADIN CLINIC  (Patient taking differently: Take 2-4 mg by mouth See admin instructions. Takes 4 mg tablet by mouth on Tuesdays and then take 2 mg tablet by mouth on all other days) 65 tablet 1   zolpidem (AMBIEN) 5 MG tablet Take 5 mg by mouth at bedtime as needed for sleep.     No current facility-administered medications for this encounter.   Allergies  Allergen Reactions   Other Anaphylaxis    Spider venom   Grapefruit Concentrate Other (See Comments)    Pt has been told not to eat grapefruit   Haloperidol Other (See Comments)    Caused the patient to be overly-sedated   Nsaids Other (See Comments)    Cannot take due to liver transplant  Rifampin Other (See Comments)    Caused flushing   Triazolam Other (See Comments)    Caused the patient to be overly-sedated   Social History   Socioeconomic History   Marital status: Single    Spouse name: Not on file   Number of children: 0   Years of education: Not on file   Highest education level: Not on file  Occupational History   Occupation: retired  Tobacco Use   Smoking status: Never   Smokeless tobacco: Never  Substance and Sexual Activity   Alcohol use: No   Drug use: No   Sexual activity: Not on file  Other Topics Concern   Not on file  Social History Narrative   Not on file   Social Determinants of Health   Financial Resource Strain: Low Risk    Difficulty of Paying Living Expenses: Not very hard  Food Insecurity: No Food Insecurity   Worried About Charity fundraiser in the Last Year: Never true   Ran Out of Food in the Last Year: Never true  Transportation Needs: No Transportation Needs   Lack of Transportation (Medical): No   Lack of Transportation (Non-Medical): No  Physical Activity: Not on file  Stress: Not on file  Social Connections: Not on file  Intimate Partner Violence: Not on file   Family History  Problem Relation Age of Onset   Atrial fibrillation Mother    Breast cancer Mother    Renal cancer Father     Hypertension Brother    Hyperlipidemia Brother    Other Brother        stent    Renal Disease Maternal Grandmother    Stroke Maternal Grandmother    Heart attack Maternal Grandfather    Cancer Paternal Grandmother    Heart failure Paternal Grandfather    Emphysema Paternal Grandfather    Bronchiolitis Paternal Grandfather    BP 110/68   Pulse 93   Wt 84.7 kg (186 lb 12.8 oz)   SpO2 94%   BMI 26.80 kg/m   Wt Readings from Last 3 Encounters:  10/05/21 84.7 kg (186 lb 12.8 oz)  09/28/21 85.9 kg (189 lb 6 oz)  09/17/21 83.2 kg (183 lb 6.4 oz)   PHYSICAL EXAM: ReDs 37% General:  NAD. No resp difficulty HEENT: Normal Neck: Supple. JVP 6-7. Carotids 2+ bilat; no bruits. No lymphadenopathy or thryomegaly appreciated. Cor: PMI nondisplaced. Irregular rate & rhythm. No rubs, gallops or murmurs. Lungs: Faint crackles RLL Abdomen: Obese, nontender, nondistended. No hepatosplenomegaly. No bruits or masses. Good bowel sounds. Extremities: No cyanosis, clubbing, rash, edema Neuro: Alert & oriented x 3, cranial nerves grossly intact. Moves all 4 extremities w/o difficulty. Affect pleasant.  ASSESSMENT & PLAN: 1. Chronic HF with mid range EF: Ischemic cardiomyopathy.  Echo in 8/22 with EF 45-50%, moderate LVH, RV normal, PASP 55 mmHg. On echo, there is some septal bounce, so post-CABG constrictive pericarditis would be a concern.  Also concern for possible cardiac amyloidosis with LVH. PYP scan not suggestive of TTR cardiac amyloidosis. 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  He has worsening NYHA IIIb symptoms today, and appears at least mildly volume overloaded on exam. Endorses drinking more fluids since discharge. Cardiomems up with dPAP 27, ReDs 37%.  - Increase torsemide to 100 mg daily (previously on 80 mg bid, may ultimately require this dosing again). BMET/BNP today, repeat BMET in 10  days.  -  Continue Farxiga 10 mg daily.  No GU symptoms. 2. CKD: Stage 3b. 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  BMET today. - Follows with Dr. Moshe Cipro. - On tacrolimus and prednisone.  3. VT: Patient has history of VT, maintained on amiodarone. Has refused ICD in the past.   - With amiodarone, follow LFTs, TSH, and eye exam. Refill amiodarone today. 4. Atrial fibrillation: Permanent x years.  Rate controlled. Unlikely to be able to regain NSR for any significant period of time, would not recommend ablation.  - Continue warfarin.   5. CAD: s/p CABG 1992.  Last LHC in 2019 in setting of VT with patent LIMA to LAD, patent SVG to PDA, moderate stenosis SVG to ramus, occluded SVG to OM1 (medical therapy suggested due to comorbidities and thrombocytopenia with need for anticoagulation).  No chest pain.   - Atorvastatin increased to 40 mg daily in 11/22 to achieve goal LDL < 70.  - On warfarin so ASA not needed.  6. S/p liver transplant: Followed at [facility].  7. OSA/Nocturnal hypoxemia: Moderate by recent sleep study, AHI 18.4/hr. Not on CPAP yet.  Follow up 4-6 weeks with APP to reassess volume (may need torsemide 80 bid with addition of metolazone prn).  [place], FNP 10/05/21

## 2021-10-05 NOTE — Progress Notes (Signed)
ReDS Vest / Clip - 10/05/21 1400       ReDS Vest / Clip   Station Marker C    Ruler Value 31    ReDS Value Range Moderate volume overload    ReDS Actual Value 37

## 2021-10-05 NOTE — Patient Instructions (Signed)
Thank you for coming in today  Labs were done today, if any labs are abnormal the clinic will call you  INCREASE Torsemide to 100 mg 5 tablets daily   DECREASE Fluid intake to 2 liters daily  Your physician recommends that you schedule a follow-up appointment in: 4-6 weeks   Your physician recommends that you return for lab work in: 10 days  At the [facility], you and your health needs are our priority. As part of our continuing mission to provide you with exceptional heart care, we have created designated Provider Care Teams. These Care Teams include your primary Cardiologist (physician) and Advanced Practice Providers (APPs- Physician Assistants and Nurse Practitioners) who all work together to provide you with the care you need, when you need it.   You may see any of the following providers on your designated Care Team at your next follow up: Dr Glori Bickers Dr Haynes Kerns, NP Lyda Jester, [place] [facility] [place], [place] Audry Riles, PharmD   Please be sure to bring in all your medications bottles to every appointment.    If you have any questions or concerns before your next appointment please send [place] a message through [place] or call our office at [phone].    TO LEAVE A MESSAGE FOR THE NURSE SELECT OPTION 2, PLEASE LEAVE A MESSAGE INCLUDING: YOUR NAME DATE OF BIRTH CALL BACK NUMBER REASON FOR CALL**this is important as we prioritize the call backs  YOU WILL RECEIVE A CALL BACK THE SAME DAY AS LONG AS YOU CALL BEFORE 4:00 PM

## 2021-10-06 ENCOUNTER — Telehealth: Payer: Self-pay | Admitting: *Deleted

## 2021-10-06 ENCOUNTER — Telehealth (HOSPITAL_COMMUNITY): Payer: Medicare Other | Admitting: Adult Health

## 2021-10-06 DIAGNOSIS — G4733 Obstructive sleep apnea (adult) (pediatric): Secondary | ICD-10-CM

## 2021-10-06 NOTE — Telephone Encounter (Addendum)
The patient has been notified of the result. Left detailed message on voicemail with date and time of titration and informed patient to call back. Marolyn Hammock, [place] 10/06/2021 2:04 PM    DME selection is [facility] Patient understands he will be contacted by [place] to set up his cpap. Patient understands to call if [place] does not contact him with new setup in a timely manner. Patient understands they will be called once confirmation has been received from [facility] that they have received their new machine to schedule 10 week follow up appointment.   [facility] notified of new cpap order  Please add to airview Patient was grateful for the call and thanked me.

## 2021-10-06 NOTE — Telephone Encounter (Signed)
-----   Message from Sueanne Margarita, MD sent at 10/01/2021 10:17 PM EST ----- Please let patient know that they have sleep apnea and recommend treating with CPAP.  Please order an auto CPAP from 4-15cm H2O with heated humidity and mask of choice.  Order overnight pulse ox on CPAP.  Followup with me in 6 weeks.

## 2021-10-06 NOTE — Telephone Encounter (Signed)
Discharged on 09/24/21 and he was not felt to be overloaded. Reading was 21.   He was seen in the HF Clinic 10/05/21. Cardiomems reading was 27. Torsemide was increased to 100 mg daily.   Cardiomems Goal 18   No reading today. Asked him to send daily reading.   Mr [name] appreciated the call.   Donavin Audino NP-C  2:34 PM

## 2021-10-07 ENCOUNTER — Ambulatory Visit (INDEPENDENT_AMBULATORY_CARE_PROVIDER_SITE_OTHER): Payer: Medicare Other

## 2021-10-07 DIAGNOSIS — Z7901 Long term (current) use of anticoagulants: Secondary | ICD-10-CM

## 2021-10-07 DIAGNOSIS — Z7189 Other specified counseling: Secondary | ICD-10-CM

## 2021-10-07 DIAGNOSIS — I4821 Permanent atrial fibrillation: Secondary | ICD-10-CM

## 2021-10-07 LAB — POCT INR: INR: 6.1 — AB (ref 2.0–3.0)

## 2021-10-07 NOTE — Patient Instructions (Signed)
Description   Spoke with pt, instructed to hold Warfarin x 3 dosages, then start taking Warfarin 2mg  daily. Remain consistent with your green leafy vegetables. Recheck INR 1 week. Go to the ED with bleeding issues.  Weekly Personal assistant for insurance purposes. [phone]

## 2021-10-08 ENCOUNTER — Inpatient Hospital Stay (HOSPITAL_COMMUNITY): Admission: RE | Admit: 2021-10-08 | Payer: Medicare Other | Source: Ambulatory Visit

## 2021-10-12 ENCOUNTER — Ambulatory Visit (INDEPENDENT_AMBULATORY_CARE_PROVIDER_SITE_OTHER): Payer: Medicare Other

## 2021-10-12 DIAGNOSIS — Z7901 Long term (current) use of anticoagulants: Secondary | ICD-10-CM

## 2021-10-12 DIAGNOSIS — I4821 Permanent atrial fibrillation: Secondary | ICD-10-CM

## 2021-10-14 ENCOUNTER — Ambulatory Visit (INDEPENDENT_AMBULATORY_CARE_PROVIDER_SITE_OTHER): Payer: Medicare Other

## 2021-10-14 DIAGNOSIS — Z7901 Long term (current) use of anticoagulants: Secondary | ICD-10-CM

## 2021-10-14 DIAGNOSIS — Z5181 Encounter for therapeutic drug level monitoring: Secondary | ICD-10-CM

## 2021-10-14 DIAGNOSIS — I4821 Permanent atrial fibrillation: Secondary | ICD-10-CM | POA: Diagnosis not present

## 2021-10-14 LAB — POCT INR: INR: 1.9 — AB (ref 2.0–3.0)

## 2021-10-14 NOTE — Patient Instructions (Signed)
Description   Spoke with pt, instructed to take 1 tablet (4mg ) today and then continue taking Warfarin 2mg  daily. Remain consistent with your green leafy vegetables. Recheck INR 1 week. Go to the ED with bleeding issues.  Weekly Personal assistant for insurance purposes. [phone]

## 2021-10-20 ENCOUNTER — Encounter (HOSPITAL_COMMUNITY): Payer: Medicare Other

## 2021-10-21 ENCOUNTER — Ambulatory Visit (INDEPENDENT_AMBULATORY_CARE_PROVIDER_SITE_OTHER): Payer: Medicare Other | Admitting: Pharmacist

## 2021-10-21 ENCOUNTER — Telehealth (HOSPITAL_COMMUNITY): Payer: Self-pay | Admitting: Adult Health

## 2021-10-21 DIAGNOSIS — Z7901 Long term (current) use of anticoagulants: Secondary | ICD-10-CM

## 2021-10-21 DIAGNOSIS — I4821 Permanent atrial fibrillation: Secondary | ICD-10-CM

## 2021-10-21 LAB — POCT INR: INR: 2.3 (ref 2.0–3.0)

## 2021-10-21 NOTE — Telephone Encounter (Signed)
Cardiomems   Goal 18  Most recent reading 24. Suggestive of fluid accumulation.   Please call and advise to take extra 40 mg of torsmemide for 2 days .   Discussed low salt food choices and limiting fluid intake to < 2 liters per day.  Please call.   Treyvon Blahut NP-C  11:44 AM

## 2021-10-21 NOTE — Telephone Encounter (Signed)
Pt aware and voiced understanding 

## 2021-10-21 NOTE — Patient Instructions (Signed)
Description   Spoke with pt, instructed to continue taking Warfarin 2mg  daily. Remain consistent with your green leafy vegetables. Recheck INR 1 week.  Weekly Personal assistant for insurance purposes. [phone]

## 2021-10-26 ENCOUNTER — Telehealth (HOSPITAL_COMMUNITY): Payer: Self-pay | Admitting: *Deleted

## 2021-10-26 NOTE — Telephone Encounter (Signed)
Pt left vm asking for return call about c pap machine. I called pt and left vm for pt to call [facility] at [phone]

## 2021-10-29 ENCOUNTER — Ambulatory Visit (INDEPENDENT_AMBULATORY_CARE_PROVIDER_SITE_OTHER): Payer: Medicare Other

## 2021-10-29 DIAGNOSIS — Z7901 Long term (current) use of anticoagulants: Secondary | ICD-10-CM | POA: Diagnosis not present

## 2021-10-29 DIAGNOSIS — I4821 Permanent atrial fibrillation: Secondary | ICD-10-CM | POA: Diagnosis not present

## 2021-10-29 DIAGNOSIS — Z5181 Encounter for therapeutic drug level monitoring: Secondary | ICD-10-CM

## 2021-10-29 LAB — POCT INR: INR: 2.3 (ref 2.0–3.0)

## 2021-10-29 NOTE — Patient Instructions (Signed)
Description   Spoke with pt, instructed to continue taking Warfarin 2mg  daily. Remain consistent with your green leafy vegetables. Recheck INR 1 week.  Weekly Personal assistant for insurance purposes. [phone]

## 2021-11-06 ENCOUNTER — Ambulatory Visit (INDEPENDENT_AMBULATORY_CARE_PROVIDER_SITE_OTHER): Payer: Medicare Other

## 2021-11-06 DIAGNOSIS — Z7901 Long term (current) use of anticoagulants: Secondary | ICD-10-CM | POA: Diagnosis not present

## 2021-11-06 DIAGNOSIS — Z5181 Encounter for therapeutic drug level monitoring: Secondary | ICD-10-CM

## 2021-11-06 DIAGNOSIS — I4821 Permanent atrial fibrillation: Secondary | ICD-10-CM

## 2021-11-06 LAB — POCT INR: INR: 2.1 (ref 2.0–3.0)

## 2021-11-06 NOTE — Patient Instructions (Signed)
Spoke with pt, instructed to continue taking Warfarin 2mg  daily. Remain consistent with your green leafy vegetables. Recheck INR 1 week.  Weekly Personal assistant for insurance purposes. [phone]

## 2021-11-08 LAB — POCT INR: INR: 2.1 (ref 2.0–3.0)

## 2021-11-10 ENCOUNTER — Ambulatory Visit (INDEPENDENT_AMBULATORY_CARE_PROVIDER_SITE_OTHER): Payer: Medicare Other

## 2021-11-10 DIAGNOSIS — Z7901 Long term (current) use of anticoagulants: Secondary | ICD-10-CM

## 2021-11-10 DIAGNOSIS — Z5181 Encounter for therapeutic drug level monitoring: Secondary | ICD-10-CM | POA: Diagnosis not present

## 2021-11-10 DIAGNOSIS — I4821 Permanent atrial fibrillation: Secondary | ICD-10-CM

## 2021-11-10 NOTE — Patient Instructions (Signed)
Left message on pt's vm instructing him to continue taking Warfarin 2mg  daily. Remain consistent with your green leafy vegetables. Recheck INR 1 week.  Weekly Personal assistant for insurance purposes. [phone]

## 2021-11-11 ENCOUNTER — Ambulatory Visit (HOSPITAL_COMMUNITY)
Admission: RE | Admit: 2021-11-11 | Discharge: 2021-11-11 | Disposition: A | Discharge status: Home / Self Care | Payer: Medicare Other | Source: Ambulatory Visit | Attending: Adult Health | Admitting: Adult Health

## 2021-11-11 ENCOUNTER — Other Ambulatory Visit: Payer: Self-pay

## 2021-11-11 DIAGNOSIS — I5022 Chronic systolic (congestive) heart failure: Secondary | ICD-10-CM

## 2021-11-11 NOTE — Progress Notes (Incomplete)
[name], MD Heme/Onc: Dr. Hosie Poisson HF Cardiologist: Dr. Aundra Dubin  HPI: This is a 71 y.o. 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 [name], depression, chronic ITP. Has been on midodrine in past for hypotension.    He has been a DNR. Reports poor quality of life for several years. Weak and dyspnea with minimal activity.  Had multiple admissions for acute on chronic CHF.   Patient admitted to [facility] at end of June 2022 with acute on chronic CHF. Diuresed with IV lasix. Sent home on prior home dose furosemide at 40 mg BID.  Metolazone was stopped.  Echo with EF 45%. Coreg held d/t hypotension and started on Toprol xl at D/C. Not on SGLT2i due to cost. Initiated on losartan.    Seen in ED 07/22 with lightheadedness and hypotension. No orthostasis. Losartan and metoprolol held.   Has multiple dental caries treated along with several extractions at [facility] 06/09/21.   He was seen in clinic by Dr. [name] 08/22 and appeared volume overloaded. Short of breath with 10# lb weight gain. Had restarted metolazone on his own a week prior. Admission recommended for diuresis and possible inotropic support. Midodrine held since blood pressure okay. Diuresed with IV lasix + metolazone. AHF consulted for further management. Echo with EF 45-50%, multiple WMA, RV okay, PASP 55 mmHg, LA severely dilated, mild TR, trial MR. Concern for cardiac amyloid with LVH and he underwent PYP scan which was not suggestive of TTR cardiac amyloidosis. RHC showed elevated right and left filling pressures; RA mean 13, RV 53/15, PA 63/23, mean 39, PCWP mean 20, CO/CI 6.3/3.07, PVR 3 WU. Transitioned to po torsemide at discharge.  S/p Cardiomems implant 08/04/21. Elevated R/L filling pressures, torsemide  increased to 60/40 mg daily.  Last seen for f/u 09/17/21. Appeared volume up. PADP 22 (goal 25), goal lowered to 18. Torsemide increased to 80 qam/60 qpm. Home sleep study arranged which demonstrated moderate OSA and nocturnal hypoxemia. CXR concerning for pulmonary edema versus pneumonia.  Torsemide increased to 80 mg BID on 11/15 d/t PADP 25.   Admitted 09/24/21 with septic shock of unclear etiology and AKI. He was fluid resuscitated and treated with abx. Diuresed with IV lasix. Placed on 80 mg Torsemide daily at discharge.   Today he returns for HF follow up. He feels like he is fluid overloaded, he is SOB walking on flat ground. Does have left arm pain and has taken ibuprofen and aleve for the past day with some relief. Denies CP, dizziness, edema, or PND/Orthopnea. Appetite ok. No fever or chills. Weight at home 180 pounds, baseline is 183-188 lbs. Taking all medications. Has not taken amiodarone in about a week. He has been drinking more fluids since being home from hospital.  ECG (personally reviewed): atrial fibrillation 88 bpm.  Cardiomems: dPAP today 27 (goal 18)  REDS clip 37%  Labs (10/22): LDL 82, HDL 37, K 3.8, creatinine 2.08 Labs (11/22): K 4.0, creatinine 1.64  PMH: 1. H/o VT: Has refused ICD. 2. Chronic ITP 3. Hyperlipidemia 4. ESRD s/p renal transplant 2008, now off HD.  5. CAD: CABG 1992.  - LHC (9/19): total occlusion of LAD, LCx, RCA.  LIMA-LAD, SVG-ramus, SVG-PDA patent.  Occlusion of SVG-OM1.  Moderate stenosis SVG-ramus, treated medically.  6. MGUS 7. Chronic HF with mid range EF: Ischemic cardiomyopathy.  - PYP scan 8/22: grade 1, H/CL 1.0 => not suggestive of TTR cardiac amyloidoisis.  - RHC (8/22): mean RA 13, PA 63/23 mean 39, mean PCWP 20, CI 3.07, PVR 3 WU.  - Echo (8/22): EF 45-50%, inferoseptal/inferior/inferolateral hypokinesis, normal RV.  - Cardiomems 9/22 8. Liver transplant 2008.  9. Atrial fibrillation: Permanent.  10. Depression  Current  Outpatient Medications  Medication Sig Dispense Refill   allopurinol (ZYLOPRIM) 100 MG tablet Take 100 mg by mouth daily.     amiodarone (PACERONE) 200 MG tablet Take 1 tablet (200 mg total) by mouth daily. 30 tablet 5   atorvastatin (LIPITOR) 40 MG tablet Take 1 tablet (40 mg total) by mouth at bedtime. 90 tablet 3   calcitRIOL (ROCALTROL) 0.25 MCG capsule Take 0.25 mcg by mouth daily.     Cholecalciferol (VITAMIN D3) 50 MCG (2000 UT) TABS Take 2,000 Units by mouth daily.     Cyanocobalamin (VITAMIN B12) 1000 MCG TBCR Take 1,000 mcg by mouth daily.     dapagliflozin propanediol (FARXIGA) 10 MG TABS tablet Take 1 tablet (10 mg total) by mouth daily. 30 tablet 6   DULoxetine (CYMBALTA) 60 MG capsule Take 60 mg by mouth at bedtime.     EPINEPHrine (EPI-PEN) 0.3 mg/0.3 mL DEVI Inject 0.3 mg into the muscle once as needed (severe allergic reaction).     ferrous sulfate 325 (65 FE) MG tablet Take 325 mg by mouth daily with breakfast.     fish oil-omega-3 fatty acids 1000 MG capsule Take 1 g by mouth 2 (two) times daily.     MAGNESIUM-OXIDE 400 (240 Mg) MG tablet Take 400 mg by mouth 2 (two) times daily.     melatonin 5 MG TABS Take 5 mg by mouth at bedtime.     nitroGLYCERIN (NITROSTAT) 0.4 MG SL tablet Place 0.4 mg under the tongue every 5 (five) minutes as needed for chest pain.     potassium chloride SA (KLOR-CON) 20 MEQ tablet Take 2 tablets (40 mEq total) by mouth 3 (three) times daily. 90 tablet 6   predniSONE (DELTASONE) 5 MG tablet Take 1 tablet (5 mg total) by mouth daily.     QUEtiapine (SEROQUEL) 50 MG tablet Take 150 mg by mouth at bedtime.     tacrolimus (PROGRAF) 0.5 MG capsule Take 0.5 mg by mouth See admin instructions. Takes 0.5 mg  tablet by mouth one day and 1 mg tablets next day (1 and 2 tablets on alternate days)     torsemide (DEMADEX) 20 MG tablet Take 5 tablets (100 mg total) by mouth daily. 210 tablet 6   warfarin (COUMADIN) 4 MG tablet TAKE AS DIRECTED BY COUMADIN CLINIC  (Patient taking differently: Take 2-4 mg by mouth See admin instructions. Takes 4 mg tablet by mouth on Tuesdays and then take 2 mg tablet by mouth on all other days) 65 tablet 1   zolpidem (AMBIEN) 5 MG tablet Take 5 mg by mouth at bedtime as needed for sleep.     No current facility-administered medications for this visit.   Allergies  Allergen Reactions   Other Anaphylaxis    Spider venom   Grapefruit Concentrate Other (See Comments)    Pt has been told not to eat grapefruit   Haloperidol Other (See Comments)    Caused the patient to be overly-sedated   Nsaids Other (See Comments)    Cannot take due to liver transplant  Rifampin Other (See Comments)    Caused flushing   Triazolam Other (See Comments)    Caused the patient to be overly-sedated   Social History   Socioeconomic History   Marital status: Single    Spouse name: Not on file   Number of children: 0   Years of education: Not on file   Highest education level: Not on file  Occupational History   Occupation: retired  Tobacco Use   Smoking status: Never   Smokeless tobacco: Never  Substance and Sexual Activity   Alcohol use: No   Drug use: No   Sexual activity: Not on file  Other Topics Concern   Not on file  Social History Narrative   Not on file   Social Determinants of Health   Financial Resource Strain: Low Risk    Difficulty of Paying Living Expenses: Not very hard  Food Insecurity: No Food Insecurity   Worried About Charity fundraiser in the Last Year: Never true   Ran Out of Food in the Last Year: Never true  Transportation Needs: No Transportation Needs   Lack of Transportation (Medical): No   Lack of Transportation (Non-Medical): No  Physical Activity: Not on file  Stress: Not on file  Social Connections: Not on file  Intimate Partner Violence: Not on file   Family History  Problem Relation Age of Onset   Atrial fibrillation Mother    Breast cancer Mother    Renal cancer Father     Hypertension Brother    Hyperlipidemia Brother    Other Brother        stent    Renal Disease Maternal Grandmother    Stroke Maternal Grandmother    Heart attack Maternal Grandfather    Cancer Paternal Grandmother    Heart failure Paternal Grandfather    Emphysema Paternal Grandfather    Bronchiolitis Paternal Grandfather    There were no vitals taken for this visit.  Wt Readings from Last 3 Encounters:  10/05/21 84.7 kg (186 lb 12.8 oz)  09/28/21 85.9 kg (189 lb 6 oz)  09/17/21 83.2 kg (183 lb 6.4 oz)   PHYSICAL EXAM: ReDs 37% General:  NAD. No resp difficulty HEENT: Normal Neck: Supple. JVP 6-7. Carotids 2+ bilat; no bruits. No lymphadenopathy or thryomegaly appreciated. Cor: PMI nondisplaced. Irregular rate & rhythm. No rubs, gallops or murmurs. Lungs: Faint crackles RLL Abdomen: Obese, nontender, nondistended. No hepatosplenomegaly. No bruits or masses. Good bowel sounds. Extremities: No cyanosis, clubbing, rash, edema Neuro: Alert & oriented x 3, cranial nerves grossly intact. Moves all 4 extremities w/o difficulty. Affect pleasant.  ASSESSMENT & PLAN: 1. Chronic HF with mid range EF: Ischemic cardiomyopathy.  Echo in 8/22 with EF 45-50%, moderate LVH, RV normal, PASP 55 mmHg. On echo, there is some septal bounce, so post-CABG constrictive pericarditis would be a concern.  Also concern for possible cardiac amyloidosis with LVH. PYP scan not suggestive of TTR cardiac amyloidosis. 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  He has worsening NYHA IIIb symptoms today, and appears at least mildly volume overloaded on exam. Endorses drinking more fluids since discharge. Cardiomems up with dPAP 27, ReDs 37%.  - Increase torsemide to 100 mg daily (previously on 80 mg bid, may ultimately require this dosing again). BMET/BNP today, repeat BMET in 10 days.  - Continue Farxiga 10 mg daily.  No  GU symptoms. 2. CKD: Stage 3b. History of failed  renal transplant in '97 followed by renal transplant in '08.  BMET today. - Follows with Dr. Moshe Cipro. - On tacrolimus and prednisone.  3. VT: Patient has history of VT, maintained on amiodarone. Has refused ICD in the past.   - With amiodarone, follow LFTs, TSH, and eye exam. Refill amiodarone today. 4. Atrial fibrillation: Permanent x years.  Rate controlled. Unlikely to be able to regain NSR for any significant period of time, would not recommend ablation.  - Continue warfarin.   5. CAD: s/p CABG 1992.  Last LHC in 2019 in setting of VT with patent LIMA to LAD, patent SVG to PDA, moderate stenosis SVG to ramus, occluded SVG to OM1 (medical therapy suggested due to comorbidities and thrombocytopenia with need for anticoagulation).  No chest pain.   - Atorvastatin increased to 40 mg daily in 11/22 to achieve goal LDL < 70.  - On warfarin so ASA not needed.  6. S/p liver transplant: Followed at [facility].  7. OSA/Nocturnal hypoxemia: Moderate by recent sleep study, AHI 18.4/hr. Not on CPAP yet.  Follow up 4-6 weeks with APP to reassess volume (may need torsemide 80 bid with addition of metolazone prn).  [place], FNP 11/11/21

## 2021-11-11 NOTE — Progress Notes (Signed)
°  Cardiomems Remote Monitoring  S/P Cardiomems Implant 08/04/21  PAD Goal: 18 Most recent reading: 19  Recommended changes: None   I continue to review and analyze the patients PA pressures weekly (and more often as needed) to bring PA pressures within the optimal range.     Sherron Mapp NP-C  12:31 PM

## 2021-11-15 ENCOUNTER — Other Ambulatory Visit: Payer: Self-pay

## 2021-11-15 ENCOUNTER — Emergency Department (HOSPITAL_COMMUNITY)
Admission: EM | Admit: 2021-11-15 | Discharge: 2021-11-16 | Disposition: A | Discharge status: Other Acute Inpatient Hospital | Payer: Medicare Other | Attending: Emergency Medicine | Admitting: Emergency Medicine

## 2021-11-15 ENCOUNTER — Encounter (HOSPITAL_COMMUNITY): Payer: Self-pay | Admitting: Emergency Medicine

## 2021-11-15 DIAGNOSIS — Z20822 Contact with and (suspected) exposure to covid-19: Secondary | ICD-10-CM | POA: Insufficient documentation

## 2021-11-15 DIAGNOSIS — I110 Hypertensive heart disease with heart failure: Secondary | ICD-10-CM | POA: Insufficient documentation

## 2021-11-15 DIAGNOSIS — F332 Major depressive disorder, recurrent severe without psychotic features: Secondary | ICD-10-CM | POA: Insufficient documentation

## 2021-11-15 DIAGNOSIS — I509 Heart failure, unspecified: Secondary | ICD-10-CM | POA: Insufficient documentation

## 2021-11-15 DIAGNOSIS — Z7901 Long term (current) use of anticoagulants: Secondary | ICD-10-CM | POA: Insufficient documentation

## 2021-11-15 DIAGNOSIS — I4891 Unspecified atrial fibrillation: Secondary | ICD-10-CM | POA: Insufficient documentation

## 2021-11-15 DIAGNOSIS — R45851 Suicidal ideations: Secondary | ICD-10-CM | POA: Insufficient documentation

## 2021-11-15 DIAGNOSIS — F32A Depression, unspecified: Secondary | ICD-10-CM | POA: Diagnosis present

## 2021-11-15 HISTORY — DX: Depression, unspecified: F32.A

## 2021-11-15 LAB — COMPREHENSIVE METABOLIC PANEL
ALT: 18 U/L (ref 0–44)
AST: 23 U/L (ref 15–41)
Albumin: 4.3 g/dL (ref 3.5–5.0)
Alkaline Phosphatase: 79 U/L (ref 38–126)
Anion gap: 10 (ref 5–15)
BUN: 23 mg/dL (ref 8–23)
CO2: 28 mmol/L (ref 22–32)
Calcium: 9.4 mg/dL (ref 8.9–10.3)
Chloride: 100 mmol/L (ref 98–111)
Creatinine, Ser: 2.2 mg/dL — ABNORMAL HIGH (ref 0.61–1.24)
GFR, Estimated: 32 mL/min — ABNORMAL LOW (ref >60)
Glucose, Bld: 111 mg/dL — ABNORMAL HIGH (ref 70–99)
Potassium: 4.1 mmol/L (ref 3.5–5.1)
Sodium: 138 mmol/L (ref 135–145)
Total Bilirubin: 2.7 mg/dL — ABNORMAL HIGH (ref 0.3–1.2)
Total Protein: 8.1 g/dL (ref 6.5–8.1)

## 2021-11-15 LAB — CBC WITH DIFFERENTIAL/PLATELET
Abs Immature Granulocytes: 0.06 10*3/uL (ref 0.00–0.07)
Basophils Absolute: 0 10*3/uL (ref 0.0–0.1)
Basophils Relative: 0 %
Eosinophils Absolute: 0.1 10*3/uL (ref 0.0–0.5)
Eosinophils Relative: 1 %
HCT: 52.7 % — ABNORMAL HIGH (ref 39.0–52.0)
Hemoglobin: 16.3 g/dL (ref 13.0–17.0)
Immature Granulocytes: 1 %
Lymphocytes Relative: 16 %
Lymphs Abs: 1 10*3/uL (ref 0.7–4.0)
MCH: 27 pg (ref 26.0–34.0)
MCHC: 30.9 g/dL (ref 30.0–36.0)
MCV: 87.4 fL (ref 80.0–100.0)
Monocytes Absolute: 0.3 10*3/uL (ref 0.1–1.0)
Monocytes Relative: 5 %
Neutro Abs: 4.7 10*3/uL (ref 1.7–7.7)
Neutrophils Relative %: 77 %
Platelets: 90 10*3/uL — ABNORMAL LOW (ref 150–400)
RBC: 6.03 MIL/uL — ABNORMAL HIGH (ref 4.22–5.81)
RDW: 19.2 % — ABNORMAL HIGH (ref 11.5–15.5)
WBC: 6.1 10*3/uL (ref 4.0–10.5)
nRBC: 0 % (ref 0.0–0.2)

## 2021-11-15 LAB — RAPID URINE DRUG SCREEN, HOSP PERFORMED
Amphetamines: NOT DETECTED
Barbiturates: NOT DETECTED
Benzodiazepines: NOT DETECTED
Cocaine: NOT DETECTED
Opiates: NOT DETECTED
Tetrahydrocannabinol: NOT DETECTED

## 2021-11-15 LAB — RESP PANEL BY RT-PCR (FLU A&B, COVID) ARPGX2
Influenza A by PCR: NEGATIVE
Influenza B by PCR: NEGATIVE
SARS Coronavirus 2 by RT PCR: NEGATIVE

## 2021-11-15 LAB — ETHANOL: Alcohol, Ethyl (B): <10 mg/dL (ref <10)

## 2021-11-15 NOTE — ED Provider Notes (Signed)
[facility] DEPT Provider Note   CSN: [id number] Arrival date & time: 11/15/21  1712     History  Chief Complaint  Patient presents with   Suicidal    [name] is a 71 y.o. male.  Patient with history of hypertension, afib on warfarin, and CHF presents today with depression and suicidal ideation. He endorses a history of depression for which he has been taking Cymbalta, however he stopped this medication 1 month ago and for the past 2 weeks he endorses suicidal ideation with significant worsening in the past week. He does endorse that he resumed taking Cymbalta 4 days ago but states this has not given him any improvement in his symptoms. He endorses suicidal ideation without any plan. Does endorse that he owns a gun, but states he does not know how to shoot it and has never removed it from the box. He denies any previous suicide attempts. He denies any visual or auditory hallucinations. He is requesting psychiatric consult and is here voluntarily.   The history is provided by the patient. No language interpreter was used.      Home Medications Prior to Admission medications   Medication Sig Start Date End Date Taking? Authorizing Provider  allopurinol (ZYLOPRIM) 100 MG tablet Take 100 mg by mouth daily.   Yes [provider]  amiodarone (PACERONE) 200 MG tablet Take 1 tablet (200 mg total) by mouth daily. 10/05/21  Yes [place], Maricela Bo, FNP  atorvastatin (LIPITOR) 40 MG tablet Take 1 tablet (40 mg total) by mouth at bedtime. Patient taking differently: Take 20 mg by mouth at bedtime. 09/17/21  Yes Larey Dresser, MD  calcitRIOL (ROCALTROL) 0.25 MCG capsule Take 0.25 mcg by mouth daily.   Yes [provider]  Cholecalciferol (VITAMIN D3) 50 MCG (2000 UT) TABS Take 2,000 Units by mouth daily.   Yes [provider]  Cyanocobalamin (VITAMIN B12) 1000 MCG TBCR Take 1,000 mcg by mouth daily.   Yes [provider]  dapagliflozin propanediol (FARXIGA) 10 MG TABS tablet Take 1 tablet (10 mg total) by mouth daily. 07/03/21  Yes Larey Dresser, MD  DULoxetine (CYMBALTA) 60 MG capsule Take 60 mg by mouth at bedtime.   Yes [provider]  ferrous sulfate 325 (65 FE) MG tablet Take 325 mg by mouth daily with breakfast.   Yes [provider]  fish oil-omega-3 fatty acids 1000 MG capsule Take 1 g by mouth 2 (two) times daily.   Yes [provider]  MAGNESIUM-OXIDE 400 (240 Mg) MG tablet Take 400 mg by mouth 2 (two) times daily. 05/04/21  Yes [provider]  melatonin 5 MG TABS Take 5 mg by mouth at bedtime.   Yes [provider]  potassium chloride SA (KLOR-CON) 20 MEQ tablet Take 2 tablets (40 mEq total) by mouth 3 (three) times daily. 08/04/21  Yes Larey Dresser, MD  predniSONE (DELTASONE) 5 MG tablet Take 1 tablet (5 mg total) by mouth daily. 12/29/16  Yes [name], MD  QUEtiapine (SEROQUEL) 50 MG tablet Take 200 mg by mouth at bedtime. 08/13/19  Yes [provider]  tacrolimus (PROGRAF) 0.5 MG capsule Take 0.5 mg by mouth See admin instructions. Takes 0.5 mg  tablet by mouth one day and 1 mg tablets next day (1 and 2 tablets on alternate days)   Yes [provider]  torsemide (DEMADEX) 20 MG tablet Take 5 tablets (100 mg total) by mouth daily. 10/05/21  Yes [place], Maricela Bo, FNP  warfarin (COUMADIN) 4 MG tablet TAKE AS DIRECTED BY COUMADIN CLINIC Patient taking differently: Take 2 mg by mouth every evening. 09/19/20  Yes Deboraha Sprang, MD  zolpidem (AMBIEN) 5 MG tablet Take 5 mg by mouth at bedtime as needed for sleep. 07/03/21  Yes [provider]  EPINEPHrine (EPI-PEN) 0.3 mg/0.3 mL DEVI Inject 0.3 mg into the muscle once as needed (severe allergic reaction). Patient not taking: Reported on 11/15/2021    [provider]  nitroGLYCERIN (NITROSTAT) 0.4 MG SL tablet Place 0.4 mg under the tongue every 5 (five) minutes as  needed for chest pain. Patient not taking: Reported on 11/15/2021    [provider]      Allergies    Other, Grapefruit concentrate, Haloperidol, Nsaids, Rifampin, and Triazolam    Review of Systems   Review of Systems  Constitutional:  Negative for chills and fever.  Respiratory:  Negative for shortness of breath.   Gastrointestinal:  Negative for diarrhea, nausea and vomiting.  Musculoskeletal:  Negative for gait problem.  Skin:  Negative for rash and wound.  Neurological:  Negative for headaches.  Psychiatric/Behavioral:  Positive for sleep disturbance and suicidal ideas. Negative for agitation, behavioral problems, confusion, decreased concentration, dysphoric mood, hallucinations and self-injury. The patient is not nervous/anxious and is not hyperactive.    Physical Exam Updated Vital Signs BP 126/72 (BP Location: Left Arm)    Pulse 82    Temp 98.2 F (36.8 C) (Oral)    Resp 18    Ht 5\' 10"  (1.778 m)    Wt 84.4 kg    SpO2 95%    BMI 26.69 kg/m  Physical Exam Vitals and nursing note reviewed.  Constitutional:      General: He is not in acute distress.    Appearance: Normal appearance. He is normal weight. He is not ill-appearing, toxic-appearing or diaphoretic.     Comments: Patient sitting comfortably in chair in no acute distress  Eyes:     Extraocular Movements: Extraocular movements intact.     Pupils: Pupils are equal, round, and reactive to light.  Cardiovascular:     Rate and Rhythm: Normal rate.  Pulmonary:     Effort: Pulmonary effort is normal. No respiratory distress.  Abdominal:     General: Abdomen is flat.     Palpations: Abdomen is soft.  Musculoskeletal:        General: Normal range of motion.     Cervical back: Normal range of motion.  Skin:    General: Skin is warm and dry.  Neurological:     General: No focal deficit present.     Mental Status: He is alert.  Psychiatric:        Mood and Affect: Mood normal.        Behavior: Behavior  normal.    ED Results / Procedures / Treatments   Labs (all labs ordered are listed, but only abnormal results are displayed) Labs Reviewed  COMPREHENSIVE METABOLIC PANEL - Abnormal; Notable for the following components:      Result Value   Glucose, Bld 111 (*)    Creatinine, Ser 2.20 (*)    Total Bilirubin 2.7 (*)    GFR, Estimated 32 (*)    All other components within normal limits  CBC WITH DIFFERENTIAL/PLATELET - Abnormal; Notable for the following components:   RBC 6.03 (*)    HCT 52.7 (*)    RDW 19.2 (*)    Platelets 90 (*)  All other components within normal limits  RESP PANEL BY RT-PCR (FLU A&B, COVID) ARPGX2  ETHANOL  RAPID URINE DRUG SCREEN, HOSP PERFORMED    EKG EKG Interpretation  Date/Time:  Sunday November 15 2021 20:59:54 EST Ventricular Rate:  78 PR Interval:    QRS Duration: 124 QT Interval:  418 QTC Calculation: 476 R Axis:   92 Text Interpretation: Atrial fibrillation Rightward axis Inferior infarct , age undetermined Anterior infarct , age undetermined ST & T wave abnormality, consider lateral ischemia Abnormal ECG When compared with ECG of 05-Oct-2021 14:00, PREVIOUS ECG IS PRESENT since last tracing no significant change Confirmed by Daleen Bo [phone]) on 11/15/2021 9:37:28 PM  Radiology No results found.  Procedures Procedures    Medications Ordered in ED Medications - No data to display  ED Course/ Medical Decision Making/ A&P                           Medical Decision Making  Patient presents today with suicidal ideation. He is currently voluntary, unable to consent for safety, but not a flight risk at this time. Therefore do not think he needs IVC at this time, but if he were to try to leave he would then require IVC given suicidal ideation.  Labs without acute findings, creatinine at 2.20 which is his baseline. EKG shows afib which is his baseline rhythm. He is medically cleared for TTS consult at this time.  Discussed patient with  Ileene Musa, PA-C with psychiatry who states that patient meets criteria for inpatient psychiatric treatment. [facility] states that appropriate bed is not currently available, plan for other facilities to be contacted for placement.    Final Clinical Impression(s) / ED Diagnoses Final diagnoses:  Suicidal ideation    Rx / DC Orders ED Discharge Orders     None         Nestor Lewandowsky 11/16/21 6045    Daleen Bo, MD 11/16/21 1429

## 2021-11-15 NOTE — ED Triage Notes (Signed)
Patient arrives with his sister in law c/o emotional problems. States he had stopped taking his Cymbalta for a month and just restarted taking it Wednesday. Family states he has been very tearful, anxious and depressed. Patient has expressed suicidal ideations to help ease his emotions however has no plan.

## 2021-11-15 NOTE — ED Notes (Signed)
Pt item collected in 1 white bag and placed behind triage area.

## 2021-11-15 NOTE — ED Notes (Signed)
Pt items have been moved to [place]
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
## 2021-11-15 NOTE — ED Provider Triage Note (Signed)
Emergency Medicine Provider Triage Evaluation Note  [name] , a 71 y.o. male  was evaluated in triage.  Pt complains of suicidal ideations.  Patient presents with sister.  Patient states that for about 2 weeks now he has had intermittent suicidal ideations.  He states that he stopped taking his Cymbalta over a month ago.  He states that about 3 weeks ago he began having anxiousness, decreased sleep and appetite and labile emotions.  He states that he has intermittently thought about killing himself.  He denies a plan.  He does have access to firearms but states "I do not know how to shoot it."  He believes that his myriad of health issues has been part of a trigger for him.  He denies homicidal ideations, denies AVH.  He denies tobacco, alcohol or drug use.  He does take Ambien at night and denies abusing this drug.  He gives permission for me to speak with sister.  Sister states that all of what he saying is concurrent with her interpretation of events.  She states that she thinks about 2 weeks ago the patient had a primary care appointment regarding sleep apnea.  She states that he never heard back from the appointment however was shipped a CPAP machine.  She states that the patient felt like this was a trigger for him.  Review of Systems  Positive: See above Negative:   Physical Exam  BP 126/72 (BP Location: Left Arm)    Pulse 82    Temp 98.2 F (36.8 C) (Oral)    Resp 18    Ht _0  (1.778 m)    Wt 84.4 kg    SpO2 95%    BMI 26.69 kg/m  Gen:   Awake, no distress   Resp:  Normal effort  MSK:   Moves extremities without difficulty  Other:    Medical Decision Making  Medically screening exam initiated at 6:04 PM.  Appropriate orders placed.  [name] was informed that the remainder of the evaluation will be completed by another provider, this initial triage assessment does not replace that evaluation, and the importance of remaining in the ED until their evaluation is  complete.  Med clearance orders placed   Martin Hillier, PA-C 11/15/21 1806

## 2021-11-16 ENCOUNTER — Encounter: Payer: Self-pay | Admitting: Family

## 2021-11-16 ENCOUNTER — Encounter (HOSPITAL_COMMUNITY): Payer: Self-pay

## 2021-11-16 ENCOUNTER — Encounter (HOSPITAL_COMMUNITY): Payer: Medicare Other

## 2021-11-16 ENCOUNTER — Inpatient Hospital Stay
Admission: AD | Admit: 2021-11-16 | Discharge: 2021-11-26 | DRG: 885 | Disposition: A | Discharge status: Home / Self Care | Payer: Medicare Other | Source: Intra-hospital | Attending: Psychiatry | Admitting: Psychiatry

## 2021-11-16 DIAGNOSIS — F332 Major depressive disorder, recurrent severe without psychotic features: Secondary | ICD-10-CM

## 2021-11-16 DIAGNOSIS — Z951 Presence of aortocoronary bypass graft: Secondary | ICD-10-CM | POA: Diagnosis not present

## 2021-11-16 DIAGNOSIS — I120 Hypertensive chronic kidney disease with stage 5 chronic kidney disease or end stage renal disease: Secondary | ICD-10-CM | POA: Diagnosis present

## 2021-11-16 DIAGNOSIS — N186 End stage renal disease: Secondary | ICD-10-CM | POA: Diagnosis present

## 2021-11-16 DIAGNOSIS — I4891 Unspecified atrial fibrillation: Secondary | ICD-10-CM | POA: Diagnosis present

## 2021-11-16 DIAGNOSIS — R45851 Suicidal ideations: Secondary | ICD-10-CM | POA: Diagnosis present

## 2021-11-16 DIAGNOSIS — G4700 Insomnia, unspecified: Secondary | ICD-10-CM | POA: Diagnosis present

## 2021-11-16 LAB — PROTIME-INR
INR: 1.9 — ABNORMAL HIGH (ref 0.8–1.2)
Prothrombin Time: 21.5 seconds — ABNORMAL HIGH (ref 11.4–15.2)

## 2021-11-16 MED ORDER — ALLOPURINOL 100 MG PO TABS
100.0000 mg | ORAL_TABLET | Freq: Every day | ORAL | Status: DC
  Administered 2021-11-17 – 2021-11-26 (×10): 100 mg via ORAL
  Filled 2021-11-16 (×10): qty 1

## 2021-11-16 MED ORDER — VITAMIN B-12 1000 MCG PO TABS
1000.0000 ug | ORAL_TABLET | Freq: Every day | ORAL | Status: DC
  Administered 2021-11-16 – 2021-11-26 (×11): 1000 ug via ORAL
  Filled 2021-11-16 (×11): qty 1

## 2021-11-16 MED ORDER — TORSEMIDE 100 MG PO TABS
100.0000 mg | ORAL_TABLET | Freq: Every day | ORAL | Status: DC
  Administered 2021-11-16: 100 mg via ORAL
  Filled 2021-11-16: qty 1

## 2021-11-16 MED ORDER — TACROLIMUS 0.5 MG PO CAPS: 0.5000 mg | ORAL_CAPSULE | ORAL | Status: DC

## 2021-11-16 MED ORDER — TORSEMIDE 100 MG PO TABS: 100.0000 mg | ORAL_TABLET | Freq: Every day | ORAL | Status: DC

## 2021-11-16 MED ORDER — WARFARIN SODIUM 2 MG PO TABS
2.0000 mg | ORAL_TABLET | Freq: Every evening | ORAL | Status: DC
  Administered 2021-11-17 – 2021-11-25 (×9): 2 mg via ORAL
  Filled 2021-11-16 (×11): qty 1

## 2021-11-16 MED ORDER — ALUM & MAG HYDROXIDE-SIMETH 200-200-20 MG/5ML PO SUSP: 30.0000 mL | ORAL | Status: DC | PRN

## 2021-11-16 MED ORDER — TACROLIMUS 1 MG PO CAPS
1.0000 mg | ORAL_CAPSULE | ORAL | Status: DC
  Administered 2021-11-16: 1 mg via ORAL
  Filled 2021-11-16: qty 1

## 2021-11-16 MED ORDER — MELATONIN 5 MG PO TABS: 5.0000 mg | ORAL_TABLET | Freq: Every day | ORAL | Status: DC

## 2021-11-16 MED ORDER — TACROLIMUS 0.5 MG PO CAPS
0.5000 mg | ORAL_CAPSULE | ORAL | Status: DC
  Administered 2021-11-17 – 2021-11-25 (×5): 0.5 mg via ORAL
  Filled 2021-11-16 (×5): qty 1

## 2021-11-16 MED ORDER — ALPRAZOLAM 0.25 MG PO TABS
0.2500 mg | ORAL_TABLET | Freq: Once | ORAL | Status: AC
  Filled 2021-11-16: qty 1

## 2021-11-16 MED ORDER — ZOLPIDEM TARTRATE 5 MG PO TABS: 5.0000 mg | ORAL_TABLET | Freq: Every evening | ORAL | Status: DC | PRN

## 2021-11-16 MED ORDER — ATORVASTATIN CALCIUM 10 MG PO TABS: 20.0000 mg | ORAL_TABLET | Freq: Every day | ORAL | Status: DC

## 2021-11-16 MED ORDER — ALPRAZOLAM 0.25 MG PO TABS
0.2500 mg | ORAL_TABLET | Freq: Once | ORAL | Status: AC
  Administered 2021-11-16: 0.25 mg via ORAL
  Filled 2021-11-16: qty 1

## 2021-11-16 MED ORDER — WARFARIN - PHYSICIAN DOSING INPATIENT
Freq: Every day | Status: DC
  Administered 2021-11-23: 2

## 2021-11-16 MED ORDER — AMIODARONE HCL 200 MG PO TABS
200.0000 mg | ORAL_TABLET | Freq: Every day | ORAL | Status: DC
  Administered 2021-11-17 – 2021-11-26 (×10): 200 mg via ORAL
  Filled 2021-11-16 (×10): qty 1

## 2021-11-16 MED ORDER — DAPAGLIFLOZIN PROPANEDIOL 10 MG PO TABS
10.0000 mg | ORAL_TABLET | Freq: Every day | ORAL | Status: DC
  Administered 2021-11-17: 10 mg via ORAL
  Filled 2021-11-16 (×2): qty 1

## 2021-11-16 MED ORDER — MAGNESIUM HYDROXIDE 400 MG/5ML PO SUSP: 30.0000 mL | Freq: Every day | ORAL | Status: DC | PRN

## 2021-11-16 MED ORDER — FERROUS SULFATE 325 (65 FE) MG PO TABS
325.0000 mg | ORAL_TABLET | Freq: Every day | ORAL | Status: DC
  Administered 2021-11-17 – 2021-11-26 (×10): 325 mg via ORAL
  Filled 2021-11-16 (×10): qty 1

## 2021-11-16 MED ORDER — OMEGA-3-ACID ETHYL ESTERS 1 G PO CAPS
1.0000 g | ORAL_CAPSULE | Freq: Two times a day (BID) | ORAL | Status: DC
  Administered 2021-11-16 – 2021-11-26 (×20): 1 g via ORAL
  Filled 2021-11-16 (×22): qty 1

## 2021-11-16 MED ORDER — ACETAMINOPHEN 325 MG PO TABS
650.0000 mg | ORAL_TABLET | Freq: Four times a day (QID) | ORAL | Status: DC | PRN
  Administered 2021-11-21 – 2021-11-25 (×3): 650 mg via ORAL
  Filled 2021-11-16 (×3): qty 2

## 2021-11-16 MED ORDER — MELATONIN 5 MG PO TABS
5.0000 mg | ORAL_TABLET | Freq: Every day | ORAL | Status: DC
  Administered 2021-11-16 – 2021-11-18 (×3): 5 mg via ORAL
  Filled 2021-11-16 (×3): qty 1

## 2021-11-16 MED ORDER — DULOXETINE HCL 30 MG PO CPEP: 60.0000 mg | ORAL_CAPSULE | Freq: Every day | ORAL | Status: DC

## 2021-11-16 MED ORDER — TACROLIMUS 1 MG PO CAPS
1.0000 mg | ORAL_CAPSULE | ORAL | Status: DC
  Administered 2021-11-18 – 2021-11-26 (×5): 1 mg via ORAL
  Filled 2021-11-16 (×5): qty 1

## 2021-11-16 MED ORDER — WARFARIN SODIUM 2 MG PO TABS
2.0000 mg | ORAL_TABLET | Freq: Every day | ORAL | Status: DC
  Filled 2021-11-16 (×2): qty 1

## 2021-11-16 MED ORDER — QUETIAPINE FUMARATE 100 MG PO TABS
200.0000 mg | ORAL_TABLET | Freq: Every day | ORAL | Status: DC
  Administered 2021-11-16: 200 mg via ORAL
  Filled 2021-11-16: qty 2

## 2021-11-16 MED ORDER — TORSEMIDE 100 MG PO TABS
100.0000 mg | ORAL_TABLET | Freq: Every day | ORAL | Status: DC
  Administered 2021-11-17 – 2021-11-26 (×10): 100 mg via ORAL
  Filled 2021-11-16 (×10): qty 1

## 2021-11-16 MED ORDER — DULOXETINE HCL 60 MG PO CPEP
60.0000 mg | ORAL_CAPSULE | Freq: Every day | ORAL | Status: DC
  Administered 2021-11-16 – 2021-11-17 (×2): 60 mg via ORAL
  Filled 2021-11-16 (×2): qty 1

## 2021-11-16 MED ORDER — MAGNESIUM OXIDE -MG SUPPLEMENT 400 (240 MG) MG PO TABS
400.0000 mg | ORAL_TABLET | Freq: Two times a day (BID) | ORAL | Status: DC
  Administered 2021-11-16 – 2021-11-26 (×20): 400 mg via ORAL
  Filled 2021-11-16 (×20): qty 1

## 2021-11-16 MED ORDER — ONDANSETRON 4 MG PO TBDP
4.0000 mg | ORAL_TABLET | Freq: Three times a day (TID) | ORAL | Status: DC | PRN
  Administered 2021-11-17: 4 mg via ORAL
  Filled 2021-11-16 (×3): qty 1

## 2021-11-16 MED ORDER — ATORVASTATIN CALCIUM 10 MG PO TABS
20.0000 mg | ORAL_TABLET | Freq: Every day | ORAL | Status: DC
  Administered 2021-11-16 – 2021-11-25 (×10): 20 mg via ORAL
  Filled 2021-11-16 (×10): qty 2

## 2021-11-16 MED ORDER — WARFARIN - PHYSICIAN DOSING INPATIENT: Freq: Every day | Status: DC

## 2021-11-16 MED ORDER — ALPRAZOLAM 0.25 MG PO TABS
0.2500 mg | ORAL_TABLET | Freq: Once | ORAL | Status: AC
Start: 2021-11-16 — End: 2021-11-16
  Administered 2021-11-16: 0.25 mg via ORAL
  Filled 2021-11-16: qty 1

## 2021-11-16 MED ORDER — QUETIAPINE FUMARATE 100 MG PO TABS: 200.0000 mg | ORAL_TABLET | Freq: Every day | ORAL | Status: DC

## 2021-11-16 MED ORDER — POTASSIUM CHLORIDE CRYS ER 20 MEQ PO TBCR
40.0000 meq | EXTENDED_RELEASE_TABLET | Freq: Three times a day (TID) | ORAL | Status: DC
  Administered 2021-11-17 – 2021-11-26 (×28): 40 meq via ORAL
  Filled 2021-11-16 (×28): qty 2

## 2021-11-16 MED ORDER — AMIODARONE HCL 200 MG PO TABS
200.0000 mg | ORAL_TABLET | Freq: Every day | ORAL | Status: DC
  Administered 2021-11-16: 200 mg via ORAL
  Filled 2021-11-16: qty 1

## 2021-11-16 NOTE — ED Notes (Signed)
Patient's one bag of belongings sent with him to [facility] geropsych.

## 2021-11-16 NOTE — ED Notes (Signed)
Safe transport returned the call and let me know that they will be here shortly.

## 2021-11-16 NOTE — Tx Team (Signed)
Initial Treatment Plan 11/16/2021 5:56 PM [name] OBS:[id number]    PATIENT STRESSORS: Financial difficulties   Health problems   Medication change or noncompliance     PATIENT STRENGTHS: Capable of independent living  Communication skills  Supportive family/friends    PATIENT IDENTIFIED PROBLEMS: Reduced Ability to Concentrate Extreme Mood Changes  Low energy and problems sleeping                   DISCHARGE CRITERIA:  Improved stabilization in mood, thinking, and/or behavior Verbal commitment to aftercare and medication compliance  PRELIMINARY DISCHARGE PLAN: Outpatient therapy Return to previous living arrangement  PATIENT/FAMILY INVOLVEMENT: This treatment plan has been presented to and reviewed with the patient, [name], and/or family member, .  The patient and family have been given the opportunity to ask questions and make suggestions.  Theodosia Quay, RN 11/16/2021, 5:56 PM

## 2021-11-16 NOTE — BH Assessment (Signed)
Patient is to be admitted to [facility] today 11/16/21 by Dr. Louis Meckel.  Attending Physician will be Dr.  Louis Meckel .   Patient has been assigned to room 29, by [place] Nurse, Vinnie Level.    ER staff is aware of the admission:  Seth Bake, Patient Access.

## 2021-11-16 NOTE — Consult Note (Signed)
[name]  is a 71 y.o. male with worsening depression, anxiety, suicidal thoughts.  Patient has history of atrial fibrillation on warfarin, amiodarone, and congestive heart failure both of which is stable at this time.  Last seen cardiology on January 4.  Patient is seen and reassessed today by the psychiatric nurse practitioner, case discussed with Dr. Dwyane Dee.  Patient continues to endorse depressive symptoms that include hopelessness, insomnia, suicidality, mood lability, worthlessness, guilty, sadness, anhedonia, and despondency.  Patient is able to verbalize ruminate and thoughts that he describe as" out of control."  Patient reports previously taking depression medication, in which he stopped and began to develop worsening thoughts.  Patient agrees to resume medication, however is unable to contract for safety at this time.  Patient does have some improved insight, citing he does need psychiatric help as he does not trust himself.  Patient is unable to identify any triggers to result in his depression and worsening suicidal thoughts.  He continues to deny any previous suicide attempts, nonsuicidal self-injurious behavior.  He denies any current acute psychiatric symptoms to include visual and or auditory hallucinations.  Patient continues to endorse suicidal thoughts.  He denies homicidal thoughts.  Patient continues to meet inpatient psychiatric criteria at this time, as he will need.   -Please see TTS's note completed on yesterday, for full comprehensive clinical assessment.   Patient with feelings of hopelessness and despair, impaired judgement and lack of insight presents an acutely high safety risk and continues to be appropriate for inpatient psychiatric admission where he can be monitored for safety and stabilized on medications.  -Recommend inpatient psychiatric admission. -Patient is under review at [facility].  If there are no appropriate beds available, patient  will need to be referred out of system at this time.

## 2021-11-16 NOTE — ED Notes (Addendum)
Safe transport called with no answer. Message left at answering machine.

## 2021-11-16 NOTE — ED Notes (Addendum)
Pt report given to [place] at [facility].

## 2021-11-16 NOTE — Progress Notes (Signed)
Patient admitted voluntary to [facility] unit from [facility] ED with diagnosis of worsening depression after he stopped taking his Cymbalta 1 month ago.  Patient presents to unit ambulatory A&Ox4.  Patient states, "I'm here because I need help to feel better." Patient's affect is sullen, sad and depressed.  Patient endorses depression and anxiety rating both 7/10.  Patient currently denies suicidal ideations, homicidal ideations, audio or visual hallucinations and verbally contracts for safety on unit.  Patient denies pain. Patient denies smoking, alcohol or drug abuse.  Patient reports living alone, never married, no kids.  Main support system is his bother and sister-in-law. When asked what his strengths are or the goals he would like to work on while here pt states, "I want to feel better and go home."    Emotional support and reassurance provided throughout admission intake. Afterwards, oriented patient to unit, room and call light, reviewed POC with all questions answered and understanding verbalzied.  Pt compliant with all meds, swallows without difficulty.  Gait is steady.  Declined Denies any needs at this time.  Will continue to monitor with ongoing Q 15 minute safety checks per unit protocol.

## 2021-11-16 NOTE — ED Notes (Signed)
Pt has one personal belongings bag. Located in the cabinet behind the nursing desk labelled 9-12 HALL B.

## 2021-11-16 NOTE — ED Notes (Addendum)
Pt states that he just can't stand the way he feels about himself to the nurse and is quietly saying "please help me God" to himself.

## 2021-11-16 NOTE — ED Provider Notes (Signed)
Emergency Medicine Observation Re-evaluation Note  [name] is a 71 y.o. male, seen on rounds today.  Pt initially presented to the ED for complaints of Suicidal Currently, the patient is holding for inpatient treatment  Physical Exam  BP (!) 112/55 (BP Location: Left Arm)    Pulse 66    Temp 98.7 F (37.1 C) (Oral)    Resp 14    Ht 5\' 10"  (1.778 m)    Wt 84.4 kg    SpO2 99%    BMI 26.69 kg/m  Physical Exam General: awake alert pleasant Cardiac: rrr Lungs: clear Psych:   ED Course / MDM  EKG:EKG Interpretation  Date/Time:  Sunday November 15 2021 20:59:54 EST Ventricular Rate:  78 PR Interval:    QRS Duration: 124 QT Interval:  418 QTC Calculation: 476 R Axis:   92 Text Interpretation: Atrial fibrillation Rightward axis Inferior infarct , age undetermined Anterior infarct , age undetermined ST & T wave abnormality, consider lateral ischemia Abnormal ECG When compared with ECG of 05-Oct-2021 14:00, PREVIOUS ECG IS PRESENT since last tracing no significant change Confirmed by Daleen Bo [phone]) on 11/15/2021 9:37:28 PM  I have reviewed the labs performed to date as well as medications administered while in observation.  Recent changes in the last 24 hours include none.    Plan  Current plan is for inpatient treatment   [name] is not under involuntary commitment.     Fransico Meadow, PA-C 11/16/21 [place], DO 11/16/21 1455

## 2021-11-16 NOTE — ED Notes (Signed)
Safe transport called x2 with no answer. Another message was left at the answering machine.

## 2021-11-16 NOTE — ED Notes (Signed)
Pt states that he does not feel in control of himself and is feeling very anxious.

## 2021-11-17 DIAGNOSIS — F332 Major depressive disorder, recurrent severe without psychotic features: Secondary | ICD-10-CM | POA: Diagnosis not present

## 2021-11-17 LAB — LIPID PANEL
Cholesterol: 117 mg/dL (ref 0–200)
HDL: 28 mg/dL — ABNORMAL LOW (ref >40)
LDL Cholesterol: 70 mg/dL (ref 0–99)
Total CHOL/HDL Ratio: 4.2 RATIO
Triglycerides: 96 mg/dL (ref <150)
VLDL: 19 mg/dL (ref 0–40)

## 2021-11-17 LAB — HEMOGLOBIN A1C
Hgb A1c MFr Bld: 5.2 % (ref 4.8–5.6)
Mean Plasma Glucose: 102.54 mg/dL

## 2021-11-17 LAB — TSH: TSH: 1.15 u[IU]/mL (ref 0.350–4.500)

## 2021-11-17 MED ORDER — QUETIAPINE FUMARATE 100 MG PO TABS
300.0000 mg | ORAL_TABLET | Freq: Every day | ORAL | Status: DC
  Administered 2021-11-17: 300 mg via ORAL
  Filled 2021-11-17: qty 3

## 2021-11-17 MED ORDER — LORAZEPAM 0.5 MG PO TABS
0.2500 mg | ORAL_TABLET | ORAL | Status: DC | PRN
  Administered 2021-11-17 – 2021-11-19 (×2): 0.25 mg via ORAL
  Filled 2021-11-17 (×3): qty 1

## 2021-11-17 MED ORDER — WARFARIN SODIUM 2 MG PO TABS: 2.0000 mg | ORAL_TABLET | Freq: Once | ORAL | Status: DC

## 2021-11-17 MED ORDER — DAPAGLIFLOZIN PROPANEDIOL 5 MG PO TABS
10.0000 mg | ORAL_TABLET | Freq: Every day | ORAL | Status: DC
  Administered 2021-11-18 – 2021-11-26 (×9): 10 mg via ORAL
  Filled 2021-11-17 (×10): qty 2

## 2021-11-17 NOTE — BHH Counselor (Signed)
CSW made 1st attempt to complete PSA with pt. Towards the end of assessment pt began dry heaving and CSW contacted nurse to assist pt. CSW will attempt to complete assessment at another more opportune time.   Lyrika Souders [place], MSW, LCSW-A 1/10/20232:05 PM
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
## 2021-11-17 NOTE — Progress Notes (Signed)
Pt sitting in day room watching tv; calm, cooperative. Pt states "I'm okay." Pt denies pain and SI/HI/AVH at this time. Pt reports that he slept good last night; however, he has problems getting to sleep but none staying asleep. Pt describes his appetite as fair. Pt expresses feelings of anxiety, which he rates 6/10, due to "possible cymbalta withdrawal"; no s/sx of withdrawal noted. He also expresses feelings of depression, which he rates 5/10, due to "loneliness and anxious about being here". No acute distress noted.
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
## 2021-11-17 NOTE — Progress Notes (Signed)
D: Pt alert and oriented. Pt rates his anxiety and depression 6/10 during assessment. Pt denies experiencing any pain at this time. Pt denies experiencing any SI/HI, or AVH. Pt  w/ c/o nausea, NP on call notified, Zofran 4 mg po prn ordered, however, declined by patient at time of offering, "I'm feeling better, I don't need it right now."  A: Scheduled medications administered to pt, per MD orders. Support and encouragement provided. Frequent verbal contact made. Routine safety checks conducted q15 minutes.  R: No adverse drug reactions noted. Pt verbally contracts for safety at this time. Pt complaint with medications. Pt interacts appropriately with others on the unit. Pt remains safe at this time. Will continue to monitor.

## 2021-11-17 NOTE — Progress Notes (Signed)
Recreation Therapy Notes   Date: 11/17/2021  Time: 1:15pm    Location: Craft room    Behavioral response: Appropriate  Intervention Topic: Problem Solving    Discussion/Intervention:  Group content on today was focused on problem solving. The group described what problem solving is. Patients expressed how problems affect them and how they deal with problems. Individuals identified healthy ways to deal with problems. Patients explained what normally happens to them when they do not deal with problems. The group expressed reoccurring problems for them. The group participated in the intervention Ways to Solve problems where patients were given a chance to explore different ways to solve problems.   Clinical Observations/Feedback: Patient came to group and shared life experiences from problem solving. Participant was focused on what peers and staff had to say about problem solving. Individual engaged in the intervention and was social with staff and peers.  Kenneth Lax LRT/CTRS           Dilraj Killgore 11/17/2021 4:07 PM

## 2021-11-17 NOTE — Plan of Care (Signed)
Patient presents in scrubs, sitting in day room and is pleasant upon approach.  Affect is quiet, sad and depressed.  Patient reports sleeping well, denies SI, HI, AVH or pain and rates both anxiety and depression 6/10.  Patient is compliant with all medication.  No needs expressed at this time.  Pateient spending most of am in day room watching TV.  Will continue to monitor Q 15 minute rounding to ensure patient safety.   Problem: Education: Goal: Knowledge of [facility] Education information/materials will improve Outcome: Progressing Goal: Emotional status will improve Outcome: Progressing Goal: Mental status will improve Outcome: Progressing Goal: Verbalization of understanding the information provided will improve Outcome: Progressing   Problem: Education: Goal: Utilization of techniques to improve thought processes will improve Outcome: Progressing Goal: Knowledge of the prescribed therapeutic regimen will improve Outcome: Progressing   Problem: Activity: Goal: Imbalance in normal sleep/wake cycle will improve Outcome: Progressing   Problem: Coping: Goal: Coping ability will improve Outcome: Progressing Goal: Will verbalize feelings Outcome: Progressing

## 2021-11-18 DIAGNOSIS — F332 Major depressive disorder, recurrent severe without psychotic features: Secondary | ICD-10-CM | POA: Diagnosis not present

## 2021-11-18 LAB — PROTIME-INR
INR: 1.8 — ABNORMAL HIGH (ref 0.8–1.2)
Prothrombin Time: 20.7 seconds — ABNORMAL HIGH (ref 11.4–15.2)

## 2021-11-18 MED ORDER — MIRTAZAPINE 15 MG PO TABS
15.0000 mg | ORAL_TABLET | Freq: Every day | ORAL | Status: DC
  Administered 2021-11-18 – 2021-11-25 (×8): 15 mg via ORAL
  Filled 2021-11-18 (×8): qty 1

## 2021-11-18 MED ORDER — ARIPIPRAZOLE 5 MG PO TABS
5.0000 mg | ORAL_TABLET | Freq: Every day | ORAL | Status: DC
  Administered 2021-11-18 – 2021-11-22 (×5): 5 mg via ORAL
  Filled 2021-11-18 (×5): qty 1

## 2021-11-18 NOTE — BH IP Treatment Plan (Signed)
Interdisciplinary Treatment and Diagnostic Plan Update  11/18/2021 Time of Session: 10:00AM [name] MRN: [id number]  Principal Diagnosis: MDD (major depressive disorder), recurrent episode, severe ([place])  Secondary Diagnoses: Principal Problem:   MDD (major depressive disorder), recurrent episode, severe ([place]) Active Problems:   Major depressive disorder, recurrent episode, severe ([employer])   Current Medications:  Current Facility-Administered Medications  Medication Dose Route Frequency Provider Last Rate Last Admin   acetaminophen (TYLENOL) tablet 650 mg  650 mg Oral Q6H PRN Starkes-Perry, Gayland Curry, FNP       allopurinol (ZYLOPRIM) tablet 100 mg  100 mg Oral Daily Suella Broad, FNP   100 mg at 11/18/21 0951   alum & mag hydroxide-simeth (MAALOX/MYLANTA) 200-200-20 MG/5ML suspension 30 mL  30 mL Oral Q4H PRN Suella Broad, FNP       amiodarone (PACERONE) tablet 200 mg  200 mg Oral Daily Suella Broad, FNP   200 mg at 11/18/21 0951   ARIPiprazole (ABILIFY) tablet 5 mg  5 mg Oral Daily Parks Ranger, DO       atorvastatin (LIPITOR) tablet 20 mg  20 mg Oral QHS Suella Broad, FNP   20 mg at 11/17/21 2134   dapagliflozin propanediol (FARXIGA) tablet 10 mg  10 mg Oral Daily Renda Rolls, RPH   10 mg at 11/18/21 3785   ferrous sulfate tablet 325 mg  325 mg Oral Q breakfast Suella Broad, FNP   325 mg at 11/18/21 0810   LORazepam (ATIVAN) tablet 0.25 mg  0.25 mg Oral Q4H PRN Parks Ranger, DO   0.25 mg at 11/17/21 1614   magnesium hydroxide (MILK OF MAGNESIA) suspension 30 mL  30 mL Oral Daily PRN Suella Broad, FNP       magnesium oxide (MAG-OX) tablet 400 mg  400 mg Oral BID Suella Broad, FNP   400 mg at 11/18/21 0954   melatonin tablet 5 mg  5 mg Oral QHS Suella Broad, FNP   5 mg at 11/17/21 2134   mirtazapine (REMERON) tablet 15 mg  15 mg Oral QHS Parks Ranger, DO        omega-3 acid ethyl esters (LOVAZA) capsule 1 g  1 g Oral BID Suella Broad, FNP   1 g at 11/18/21 1002   ondansetron (ZOFRAN-ODT) disintegrating tablet 4 mg  4 mg Oral Q8H PRN Caroline Sauger, NP   4 mg at 11/17/21 1324   potassium chloride SA (KLOR-CON M) CR tablet 40 mEq  40 mEq Oral TID Suella Broad, FNP   40 mEq at 11/18/21 0951   tacrolimus (PROGRAF) capsule 0.5 mg  0.5 mg Oral [name], FNP   0.5 mg at 11/17/21 1018   tacrolimus (PROGRAF) capsule 1 mg  1 mg Oral [name], FNP   1 mg at 11/18/21 0955   torsemide (DEMADEX) tablet 100 mg  100 mg Oral Daily Suella Broad, FNP   100 mg at 11/18/21 8850   vitamin B-12 (CYANOCOBALAMIN) tablet 1,000 mcg  1,000 mcg Oral Daily Suella Broad, FNP   1,000 mcg at 11/18/21 2774   warfarin (COUMADIN) tablet 2 mg  2 mg Oral QPM Suella Broad, FNP   2 mg at 11/17/21 1700   Warfarin - Physician Dosing Inpatient   Does not apply q1600 [facility], [facility]   Given at 11/17/21 1658   PTA Medications: Medications Prior to Admission  Medication Sig Dispense Refill  Last Dose   allopurinol (ZYLOPRIM) 100 MG tablet Take 100 mg by mouth daily.      amiodarone (PACERONE) 200 MG tablet Take 1 tablet (200 mg total) by mouth daily. 30 tablet 5    atorvastatin (LIPITOR) 40 MG tablet Take 1 tablet (40 mg total) by mouth at bedtime. (Patient taking differently: Take 20 mg by mouth at bedtime.) 90 tablet 3    calcitRIOL (ROCALTROL) 0.25 MCG capsule Take 0.25 mcg by mouth daily.      Cholecalciferol (VITAMIN D3) 50 MCG (2000 UT) TABS Take 2,000 Units by mouth daily.      Cyanocobalamin (VITAMIN B12) 1000 MCG TBCR Take 1,000 mcg by mouth daily.      dapagliflozin propanediol (FARXIGA) 10 MG TABS tablet Take 1 tablet (10 mg total) by mouth daily. 30 tablet 6    DULoxetine (CYMBALTA) 60 MG capsule Take 60 mg by mouth at bedtime.      EPINEPHrine (EPI-PEN) 0.3 mg/0.3 mL DEVI Inject 0.3  mg into the muscle once as needed (severe allergic reaction). (Patient not taking: Reported on 11/15/2021)      ferrous sulfate 325 (65 FE) MG tablet Take 325 mg by mouth daily with breakfast.      fish oil-omega-3 fatty acids 1000 MG capsule Take 1 g by mouth 2 (two) times daily.      MAGNESIUM-OXIDE 400 (240 Mg) MG tablet Take 400 mg by mouth 2 (two) times daily.      melatonin 5 MG TABS Take 5 mg by mouth at bedtime.      nitroGLYCERIN (NITROSTAT) 0.4 MG SL tablet Place 0.4 mg under the tongue every 5 (five) minutes as needed for chest pain. (Patient not taking: Reported on 11/15/2021)      potassium chloride SA (KLOR-CON) 20 MEQ tablet Take 2 tablets (40 mEq total) by mouth 3 (three) times daily. 90 tablet 6    predniSONE (DELTASONE) 5 MG tablet Take 1 tablet (5 mg total) by mouth daily.      QUEtiapine (SEROQUEL) 50 MG tablet Take 200 mg by mouth at bedtime.      tacrolimus (PROGRAF) 0.5 MG capsule Take 0.5 mg by mouth See admin instructions. Takes 0.5 mg  tablet by mouth one day and 1 mg tablets next day (1 and 2 tablets on alternate days)      torsemide (DEMADEX) 20 MG tablet Take 5 tablets (100 mg total) by mouth daily. 210 tablet 6    warfarin (COUMADIN) 4 MG tablet TAKE AS DIRECTED BY COUMADIN CLINIC (Patient taking differently: Take 2 mg by mouth every evening.) 65 tablet 1    zolpidem (AMBIEN) 5 MG tablet Take 5 mg by mouth at bedtime as needed for sleep.       Patient Stressors: Financial difficulties   Health problems   Medication change or noncompliance    Patient Strengths: Capable of independent living  Communication skills  Supportive family/friends   Treatment Modalities: Medication Management, Group therapy, Case management,  1 to 1 session with clinician, Psychoeducation, Recreational therapy.   Physician Treatment Plan for Primary Diagnosis: MDD (major depressive disorder), recurrent episode, severe ([place]) Long Term Goal(s): Improvement in symptoms so as ready for  discharge   Short Term Goals: Ability to identify changes in lifestyle to reduce recurrence of condition will improve Ability to verbalize feelings will improve Ability to disclose and discuss suicidal ideas Ability to demonstrate self-control will improve Ability to identify and develop effective coping behaviors will improve Ability to maintain clinical measurements  within normal limits will improve Compliance with prescribed medications will improve Ability to identify triggers associated with substance abuse/mental health issues will improve  Medication Management: Evaluate patient's response, side effects, and tolerance of medication regimen.  Therapeutic Interventions: 1 to 1 sessions, Unit Group sessions and Medication administration.  Evaluation of Outcomes: Not Met  Physician Treatment Plan for Secondary Diagnosis: Principal Problem:   MDD (major depressive disorder), recurrent episode, severe ([place]) Active Problems:   Major depressive disorder, recurrent episode, severe ([place])  Long Term Goal(s): Improvement in symptoms so as ready for discharge   Short Term Goals: Ability to identify changes in lifestyle to reduce recurrence of condition will improve Ability to verbalize feelings will improve Ability to disclose and discuss suicidal ideas Ability to demonstrate self-control will improve Ability to identify and develop effective coping behaviors will improve Ability to maintain clinical measurements within normal limits will improve Compliance with prescribed medications will improve Ability to identify triggers associated with substance abuse/mental health issues will improve     Medication Management: Evaluate patient's response, side effects, and tolerance of medication regimen.  Therapeutic Interventions: 1 to 1 sessions, Unit Group sessions and Medication administration.  Evaluation of Outcomes: Not Met   RN Treatment Plan for Primary Diagnosis: MDD (major  depressive disorder), recurrent episode, severe ([place]) Long Term Goal(s): Knowledge of disease and therapeutic regimen to maintain health will improve  Short Term Goals: Ability to verbalize frustration and anger appropriately will improve, Ability to demonstrate self-control, Ability to participate in decision making will improve, Ability to verbalize feelings will improve, Ability to disclose and discuss suicidal ideas, Ability to identify and develop effective coping behaviors will improve, and Compliance with prescribed medications will improve  Medication Management: RN will administer medications as ordered by provider, will assess and evaluate patient's response and provide education to patient for prescribed medication. RN will report any adverse and/or side effects to prescribing provider.  Therapeutic Interventions: 1 on 1 counseling sessions, Psychoeducation, Medication administration, Evaluate responses to treatment, Monitor vital signs and CBGs as ordered, Perform/monitor CIWA, COWS, AIMS and Fall Risk screenings as ordered, Perform wound care treatments as ordered.  Evaluation of Outcomes: Not Met   LCSW Treatment Plan for Primary Diagnosis: MDD (major depressive disorder), recurrent episode, severe ([place]) Long Term Goal(s): Safe transition to appropriate next level of care at discharge, Engage patient in therapeutic group addressing interpersonal concerns.  Short Term Goals: Engage patient in aftercare planning with referrals and resources, Increase social support, Increase ability to appropriately verbalize feelings, Increase emotional regulation, Facilitate acceptance of mental health diagnosis and concerns, Identify triggers associated with mental health/substance abuse issues, and Increase skills for wellness and recovery  Therapeutic Interventions: Assess for all discharge needs, 1 to 1 time with Social worker, Explore available resources and support systems, Assess for adequacy in  community support network, Educate family and significant other(s) on suicide prevention, Complete Psychosocial Assessment, Interpersonal group therapy.  Evaluation of Outcomes: Not Met   Progress in Treatment: Attending groups: Yes. Participating in groups: Yes. Taking medication as prescribed: Yes. Toleration medication: Yes. Family/Significant other contact made: No, will contact:  pt declined permission for family/significant other contact Patient understands diagnosis: Yes. Discussing patient identified problems/goals with staff: Yes. Medical problems stabilized or resolved: Yes. Denies suicidal/homicidal ideation: Yes. Issues/concerns per patient self-inventory: No. Other: None  New problem(s) identified: No, Describe:  None  New Short Term/Long Term Goal(s): Patient to work towards  medication management for mood stabilization; elimination of SI thoughts; development of  comprehensive mental wellness plan.   Patient Goals:  "to get my mind clear and to get more sleep"  Discharge Plan or Barriers: CSW will assist pt in development of appropriate discharge/aftercare plan.   Reason for Continuation of Hospitalization: Anxiety Depression Medication stabilization  Estimated Length of Stay: TBD   Scribe for Treatment Team: Lakai Moree A [place], Latanya Presser 11/18/2021 10:53 AM

## 2021-11-18 NOTE — Progress Notes (Signed)
Recreation Therapy Notes  INPATIENT RECREATION TR PLAN  Patient Details Name: [name] MRN: [id number] DOB: 1950/12/02 Today's Date: 11/18/2021  Rec Therapy Plan Is patient appropriate for Therapeutic Recreation?: Yes Treatment times per week: at least 3 Estimated Length of Stay: 5-7 days TR Treatment/Interventions: Group participation (Comment)  Discharge Criteria Pt will be discharged from therapy if:: Discharged Treatment plan/goals/alternatives discussed and agreed upon by:: Patient/family  Discharge Summary     [name] 11/18/2021, 3:47 PM
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
## 2021-11-18 NOTE — BHH Suicide Risk Assessment (Signed)
[name] INPATIENT:  Family/Significant Other Suicide Prevention Education  Suicide Prevention Education: SPE completed with pt, as pt refused to consent to family contact. SPI pamphlet provided to pt and pt was encouraged to share information with support network, ask questions, and talk about any concerns relating to SPE. Pt denies access to guns/firearms and verbalized understanding of information provided. Mobile Crisis information also provided to pt.  Patient Refusal for Family/Significant Other Suicide Prevention Education: The patient [name] has refused to provide written consent for family/significant other to be provided Family/Significant Other Suicide Prevention Education during admission and/or prior to discharge.  Physician notified.  Shatika Grinnell A [place] 11/18/2021, 9:19 AM

## 2021-11-18 NOTE — Progress Notes (Signed)
Patient is alert and oriented x 4 with a quiet  and sad affect. Patient stated that he did get some sleep after requesting his PRN medication but has a problem going to sleep which is a huge concern.He denies any pain at this time. Patient rates his Anxiety a 6, and his depression a 5 both on a scale of 1-10. Resident denies SI, HI, AVH.  Patient ate breakfast in day room with good appetite. Interacted with other patients and staff appropriately. LBM was on 11/17/20. Medications administered as ordered and tolerated well. Patient is currently in his room resting and remains on Q 15 minutes safety checks

## 2021-11-18 NOTE — Progress Notes (Signed)
Recreation Therapy Notes  INPATIENT RECREATION THERAPY ASSESSMENT  Patient Details Name: [name] MRN: [id number] DOB: November 23, 1950 Today's Date: 11/18/2021       Information Obtained From: Patient  Able to Participate in Assessment/Interview: Yes  Patient Presentation: Responsive  Reason for Admission (Per Patient): Active Symptoms  Patient Stressors:    Coping Skills:   Exercise, Deep Breathing  Leisure Interests (2+):  Exercise - Walking, Tax adviser, Social - Friends (Antiques)  Frequency of Recreation/Participation: Biomedical engineer of Community Resources:  Yes  Community Resources:  [employer], Art therapist  Current Use: Yes  If no, Barriers?:    Expressed Interest in [place] of Residence:  Lobbyist  Patient Main Form of Transportation: Musician  Patient Strengths:  Good personality  Patient Identified Areas of Improvement:  Get out more  Patient Goal for Hospitalization:  A peace of mind  Current SI (including self-harm):  No  Current HI:  No  Current AVH: No  Staff Intervention Plan: Group Attendance, Collaborate with Interdisciplinary Treatment Team  Consent to Intern Participation: N/A  Analys Ryden 11/18/2021, 3:46 PM

## 2021-11-18 NOTE — Progress Notes (Signed)
Recreation Therapy Notes  Date: 11/18/2021  Time: 1:00 pm    Location: Quiet room    Behavioral response: Appropriate  Intervention Topic:  Teamwork   Discussion/Intervention:  Group content on today was focused on teamwork. The group identified what teamwork is. Individuals described who is a part of their team. Patients expressed why they thought teamwork is important. The group stated reasons why they thought it was easier to work with a Dance movement psychotherapist team. Individuals discussed some positives and negatives of working with a team. Patients gave examples of past experiences they had while working with a team. The group participated in the intervention where patients were given a chance to point out qualities, they look for in a teammate and were able to work in teams with each other.  Clinical Observations/Feedback: Patient came to group and was focused on what peers and staff had to say about teamwork. Individual engaged in the intervention and was social with staff and peers.  Dondi Burandt LRT/CTRS         Tehillah Cipriani 11/18/2021 3:01 PM

## 2021-11-18 NOTE — Progress Notes (Signed)
[facility] MD Progress Note  11/18/2021 10:29 AM Victorhugo Preis  MRN:  [id number] Subjective: [name] is seen and examined today.  Last night we increased his Seroquel to 300 mg at bedtime to help him with sleep.  He states that he did not sleep well and caused him a lot of anxiety.  He worries about it all day long.  He has a lot of negative thinking.  I discussed medication changes with him.  He has been on trazodone in the past and states that that did not help.  I told him it is probably not a good idea with his cardiac history anyway.  He has never been on Remeron or Abilify so we will try those.  Principal Problem: MDD (major depressive disorder), recurrent episode, severe ([place]) Diagnosis: Principal Problem:   MDD (major depressive disorder), recurrent episode, severe ([place]) Active Problems:   Major depressive disorder, recurrent episode, severe ([place])  Total Time spent with patient: 1020  Past Psychiatric History: See PCP  Past Medical History:  Past Medical History:  Diagnosis Date   Atrial fibrillation ([place]) 08/2009   CAD (coronary artery disease)    s/p CABG -- 1992   Depression    ESRD (end stage renal disease) ([place])    HLD (hyperlipidemia)    HTN (hypertension)    Idiopathic thrombocytopenia ([facility])     Past Surgical History:  Procedure Laterality Date   APPENDECTOMY     CORONARY ARTERY BYPASS GRAFT     FACIAL COSMETIC SURGERY     GALLBLADDER SURGERY     HERNIA REPAIR     KIDNEY TRANSPLANT     LEFT HEART CATH AND CORONARY ANGIOGRAPHY N/A 08/07/2018   Procedure: LEFT HEART CATH AND CORONARY ANGIOGRAPHY;  Surgeon: Sherren Mocha, MD;  Location: [place] [facility];  Service: Cardiovascular;  Laterality: N/A;   LIVER TRANSPLANT     PRESSURE SENSOR/CARDIOMEMS N/A 08/04/2021   Procedure: PRESSURE SENSOR/CARDIOMEMS;  Surgeon: Larey Dresser, MD;  Location: [place] CV [facility];  Service: Cardiovascular;  Laterality: N/A;   RIGHT HEART CATH N/A 06/25/2021   Procedure: RIGHT HEART  CATH;  Surgeon: Larey Dresser, MD;  Location: [place] [facility];  Service: Cardiovascular;  Laterality: N/A;   Family History:  Family History  Problem Relation Age of Onset   Atrial fibrillation Mother    Breast cancer Mother    Renal cancer Father    Hypertension Brother    Hyperlipidemia Brother    Other Brother        stent    Renal Disease Maternal Grandmother    Stroke Maternal Grandmother    Heart attack Maternal Grandfather    Cancer Paternal Grandmother    Heart failure Paternal Grandfather    Emphysema Paternal Grandfather    Bronchiolitis Paternal Grandfather     Social History:  Social History   Substance and Sexual Activity  Alcohol Use No     Social History   Substance and Sexual Activity  Drug Use No    Social History   Socioeconomic History   Marital status: Single    Spouse name: Not on file   Number of children: 0   Years of education: Not on file   Highest education level: Not on file  Occupational History   Occupation: retired  Tobacco Use   Smoking status: Never   Smokeless tobacco: Never  Vaping Use   Vaping Use: Never used  Substance and Sexual Activity   Alcohol use: No  Drug use: No   Sexual activity: Not on file  Other Topics Concern   Not on file  Social History Narrative   Not on file   Social Determinants of Health   Financial Resource Strain: Low Risk    Difficulty of Paying Living Expenses: Not very hard  Food Insecurity: No Food Insecurity   Worried About Running Out of Food in the Last Year: Never true   Ran Out of Food in the Last Year: Never true  Transportation Needs: No Transportation Needs   Lack of Transportation (Medical): No   Lack of Transportation (Non-Medical): No  Physical Activity: Not on file  Stress: Not on file  Social Connections: Not on file   Additional Social History:     Lives in [place] alone. Brother lives close by.                    Sleep: Poor  Appetite:   Fair  Current Medications: Current Facility-Administered Medications  Medication Dose Route Frequency Provider Last Rate Last Admin   acetaminophen (TYLENOL) tablet 650 mg  650 mg Oral Q6H PRN Starkes-Perry, Gayland Curry, FNP       allopurinol (ZYLOPRIM) tablet 100 mg  100 mg Oral Daily Suella Broad, FNP   100 mg at 11/18/21 0951   alum & mag hydroxide-simeth (MAALOX/MYLANTA) 200-200-20 MG/5ML suspension 30 mL  30 mL Oral Q4H PRN Suella Broad, FNP       amiodarone (PACERONE) tablet 200 mg  200 mg Oral Daily Suella Broad, FNP   200 mg at 11/18/21 0951   ARIPiprazole (ABILIFY) tablet 5 mg  5 mg Oral Daily Parks Ranger, DO       atorvastatin (LIPITOR) tablet 20 mg  20 mg Oral QHS Suella Broad, FNP   20 mg at 11/17/21 2134   dapagliflozin propanediol (FARXIGA) tablet 10 mg  10 mg Oral Daily Renda Rolls, RPH   10 mg at 11/18/21 3532   ferrous sulfate tablet 325 mg  325 mg Oral Q breakfast Suella Broad, FNP   325 mg at 11/18/21 0810   LORazepam (ATIVAN) tablet 0.25 mg  0.25 mg Oral Q4H PRN Parks Ranger, DO   0.25 mg at 11/17/21 1614   magnesium hydroxide (MILK OF MAGNESIA) suspension 30 mL  30 mL Oral Daily PRN Suella Broad, FNP       magnesium oxide (MAG-OX) tablet 400 mg  400 mg Oral BID Suella Broad, FNP   400 mg at 11/18/21 0954   melatonin tablet 5 mg  5 mg Oral QHS Suella Broad, FNP   5 mg at 11/17/21 2134   mirtazapine (REMERON) tablet 15 mg  15 mg Oral QHS Parks Ranger, DO       omega-3 acid ethyl esters (LOVAZA) capsule 1 g  1 g Oral BID Suella Broad, FNP   1 g at 11/18/21 1002   ondansetron (ZOFRAN-ODT) disintegrating tablet 4 mg  4 mg Oral Q8H PRN Caroline Sauger, NP   4 mg at 11/17/21 1324   potassium chloride SA (KLOR-CON M) CR tablet 40 mEq  40 mEq Oral TID Suella Broad, FNP   40 mEq at 11/18/21 0951   tacrolimus (PROGRAF) capsule 0.5 mg  0.5 mg Oral  [name], FNP   0.5 mg at 11/17/21 1018   tacrolimus (PROGRAF) capsule 1 mg  1 mg Oral QODAY Starkes-Perry, Gayland Curry, FNP   1 mg at  11/18/21 0955   torsemide (DEMADEX) tablet 100 mg  100 mg Oral Daily Suella Broad, FNP   100 mg at 11/18/21 5188   vitamin B-12 (CYANOCOBALAMIN) tablet 1,000 mcg  1,000 mcg Oral Daily Suella Broad, FNP   1,000 mcg at 11/18/21 4166   warfarin (COUMADIN) tablet 2 mg  2 mg Oral QPM Suella Broad, FNP   2 mg at 11/17/21 1700   Warfarin - Physician Dosing Inpatient   Does not apply q1600 RauerForde Dandy, [facility]   Given at 11/17/21 1658    Lab Results:  Results for orders placed or performed during the hospital encounter of 11/16/21 (from the past 48 hour(s))  TSH     Status: None   Collection Time: 11/17/21  8:57 AM  Result Value Ref Range   TSH 1.150 0.350 - 4.500 uIU/mL    Comment: Performed by a 3rd Generation assay with a functional sensitivity of <=0.01 uIU/mL. Performed at [facility], [address] [place], [place] [address]   Hemoglobin A1c     Status: None   Collection Time: 11/17/21  8:57 AM  Result Value Ref Range   Hgb A1c MFr Bld 5.2 4.8 - 5.6 %    Comment: (NOTE) Pre diabetes:          5.7%-6.4%  Diabetes:              >6.4%  Glycemic control for   <7.0% adults with diabetes    Mean Plasma Glucose 102.54 mg/dL    Comment: Performed at [facility] [address] [place], [place] [address]  Lipid panel     Status: Abnormal   Collection Time: 11/17/21  8:57 AM  Result Value Ref Range   Cholesterol 117 0 - 200 mg/dL   Triglycerides 96 <150 mg/dL   HDL 28 (L) >40 mg/dL   Total CHOL/HDL Ratio 4.2 RATIO   VLDL 19 0 - 40 mg/dL   LDL Cholesterol 70 0 - 99 mg/dL    Comment:        Total Cholesterol/HDL:CHD Risk Coronary Heart Disease Risk Table                     Men   Women  1/2 Average Risk   3.4   3.3  Average Risk       5.0   4.4  2 X Average Risk   9.6   7.1  3 X  Average Risk  23.4   11.0        Use the calculated Patient Ratio above and the CHD Risk Table to determine the patient's CHD Risk.        ATP III CLASSIFICATION (LDL):  <100     mg/dL   Optimal  100-129  mg/dL   Near or Above                    Optimal  130-159  mg/dL   Borderline  160-189  mg/dL   High  >190     mg/dL   Very High Performed at [facility], [address] [place], [place] [address]   Protime-INR     Status: Abnormal   Collection Time: 11/18/21  6:13 AM  Result Value Ref Range   Prothrombin Time 20.7 (H) 11.4 - 15.2 seconds   INR 1.8 (H) 0.8 - 1.2    Comment: (NOTE) INR goal varies based on device and disease states. Performed at  [facility], [address] [place], [place] [address]     Blood Alcohol level:  Lab Results  Component Value Date   ETH <10 18/56/3149    Metabolic Disorder Labs: Lab Results  Component Value Date   HGBA1C 5.2 11/17/2021   MPG 102.54 11/17/2021   MPG 105.41 08/05/2018   No results found for: PROLACTIN Lab Results  Component Value Date   CHOL 117 11/17/2021   TRIG 96 11/17/2021   HDL 28 (L) 11/17/2021   CHOLHDL 4.2 11/17/2021   VLDL 19 11/17/2021   LDLCALC 70 11/17/2021   LDLCALC 87 08/22/2019    Physical Findings: AIMS:  , ,  ,  ,    CIWA:    COWS:     Musculoskeletal: Strength & Muscle Tone: within normal limits Gait & Station: normal Patient leans: N/A  Psychiatric Specialty Exam:  Presentation  General Appearance: No data recorded Eye Contact:No data recorded Speech:No data recorded Speech Volume:No data recorded Handedness:No data recorded  Mood and Affect  Mood:No data recorded Affect:No data recorded  Thought Process  Thought Processes:No data recorded Descriptions of Associations:No data recorded Orientation:No data recorded Thought Content:No data recorded History of Schizophrenia/Schizoaffective disorder:No  Duration of Psychotic Symptoms:No data  recorded Hallucinations:No data recorded Ideas of Reference:No data recorded Suicidal Thoughts:No data recorded Homicidal Thoughts:No data recorded  Sensorium  Memory:No data recorded Judgment:No data recorded Insight:No data recorded  Executive Functions  Concentration:No data recorded Attention Span:No data recorded Recall:No data recorded Fund of Knowledge:No data recorded Language:No data recorded  Psychomotor Activity  Psychomotor Activity:No data recorded  Assets  Assets:No data recorded  Sleep  Sleep:No data recorded   Physical Exam: Physical Exam Vitals and nursing note reviewed.  Constitutional:      Appearance: Normal appearance. He is normal weight.  Neurological:     General: No focal deficit present.     Mental Status: He is alert and oriented to person, place, and time.  Psychiatric:        Attention and Perception: Attention and perception normal.        Mood and Affect: Mood is anxious and depressed.        Speech: Speech normal.        Behavior: Behavior normal. Behavior is cooperative.        Thought Content: Thought content normal.        Cognition and Memory: Cognition and memory normal.        Judgment: Judgment normal.   Review of Systems  Constitutional: Negative.   HENT: Negative.    Eyes: Negative.   Respiratory: Negative.    Cardiovascular: Negative.   Gastrointestinal: Negative.   Genitourinary: Negative.   Musculoskeletal: Negative.   Skin: Negative.   Neurological: Negative.   Endo/Heme/Allergies: Negative.   Psychiatric/Behavioral:  Positive for depression. The patient has insomnia.   Blood pressure 92/64, pulse 86, temperature 98.2 F (36.8 C), temperature source Oral, resp. rate 20, height 5\' 10"  (1.778 m), weight 82.6 kg, SpO2 98 %. Body mass index is 26.11 kg/m.   Treatment Plan Summary: Daily contact with patient to assess and evaluate symptoms and progress in treatment, Medication management, and Plan discontinue  Seroquel.  Discontinue Cymbalta.  Start Abilify 5 mg in the morning.  Start Remeron 15 mg at bedtime.  Parks Ranger, DO 11/18/2021, 10:29 AM

## 2021-11-19 DIAGNOSIS — F332 Major depressive disorder, recurrent severe without psychotic features: Secondary | ICD-10-CM | POA: Diagnosis not present

## 2021-11-19 MED ORDER — CLONAZEPAM 0.5 MG PO TABS
1.0000 mg | ORAL_TABLET | Freq: Every day | ORAL | Status: DC
  Administered 2021-11-19 – 2021-11-22 (×4): 1 mg via ORAL
  Filled 2021-11-19 (×4): qty 2

## 2021-11-19 NOTE — Progress Notes (Addendum)
Pt sitting in day room; calm, cooperative. Pt states "I'm nervous about sleep." Pt denies pain and SI/HI/AVH at this time. Pt reports that he went to sleep last night after taking PRN med around 11p and he usually has trouble falling asleep. He describes his appetite as "okay". Pt expresses feelings of anxiety, which he rates 6-7/10 and depression, which he rates 4/10 and says that both are due to his problems falling asleep. Pt educated on sleep hygiene; he verbalized understanding. No acute distress noted.

## 2021-11-19 NOTE — Progress Notes (Addendum)
Recreation Therapy Notes  Date: 11/19/2021  Time: 1:35pm    Location: Craft room   Behavioral response: Appropriate  Intervention Topic:  Self-care     Discussion/Intervention:  Group content today was focused on Self-Care. The group defined self-care and some positive ways they care for themselves. Individuals expressed ways and reasons why they neglected any self-care in the past. Patients described ways to improve self-care in the future. The group explained what could happen if they did not do any self-care activities at all. The group participated in the intervention self-care assessment where they had a chance to discover some of their weaknesses and strengths in self- care. Patient came up with a self-care plan to improve themselves in the future.   Clinical Observations/Feedback: Patient came to group late due to unknown reasons. Individual was social with peers and staff while participating in the intervention.    Lilee Aldea LRT/CTRS         Harris Penton 11/19/2021 4:11 PM

## 2021-11-19 NOTE — Plan of Care (Signed)
Patient presents in scrubs, sitting in day room and is pleasant upon approach.  Affect is quiet, sad and depressed.  Patient reports sleeping poorly, denies SI, HI, AVH or pain and rates both anxiety and depression 6/10.  Patient is compliant with all medication.  No needs expressed at this time.  Pateient spending most of shift in his room. Will continue to monitor Q 15 minute rounding to ensure patient safety.   Problem: Education: Goal: Knowledge of [place] Education information/materials will improve Outcome: Progressing Goal: Emotional status will improve Outcome: Progressing Goal: Mental status will improve Outcome: Progressing Goal: Verbalization of understanding the information provided will improve Outcome: Progressing   Problem: Health Behavior/Discharge Planning: Goal: Identification of resources available to assist in meeting health care needs will improve Outcome: Progressing Goal: Compliance with treatment plan for underlying cause of condition will improve Outcome: Progressing   Problem: Activity: Goal: Imbalance in normal sleep/wake cycle will improve Outcome: Not Progressing

## 2021-11-19 NOTE — Group Note (Signed)
[facility] LCSW Group Therapy Note   Group Date: 11/19/2021 Start Time: 1400 End Time: 1450   Type of Therapy/Topic:  Group Therapy:  Emotion Regulation  Participation Level:  Active     Description of Group:    The purpose of this group is to assist patients in learning to regulate negative emotions and experience positive emotions. Patients will be guided to discuss ways in which they have been vulnerable to their negative emotions. These vulnerabilities will be juxtaposed with experiences of positive emotions or situations, and patients challenged to use positive emotions to combat negative ones. Special emphasis will be placed on coping with negative emotions in conflict situations, and patients will process healthy conflict resolution skills.  Therapeutic Goals: Patient will identify two positive emotions or experiences to reflect on in order to balance out negative emotions:  Patient will label two or more emotions that they find the most difficult to experience:  Patient will be able to demonstrate positive conflict resolution skills through discussion or role plays:   Summary of Patient Progress:   Patient was present for the entirety of the group session. Patient was an active listener and participated in the topic of discussion, provided helpful advice to others, and added nuance to topic of conversation. CSW facilitated an emotional regulation bingo activity. He said that hope was one thing he was grateful for, and also said that breathing exercises is one way he copes when stressed.      Therapeutic Modalities:   Cognitive Behavioral Therapy Feelings Identification Dialectical Behavioral Therapy   Onyx Edgley A [place], LCSWA

## 2021-11-19 NOTE — Progress Notes (Signed)
[facility] MD Progress Note  11/19/2021 11:19 AM [name]  MRN:  [id number] Subjective: [name] is still having trouble sleeping.  He did take some as needed Ativan last night but was only 0.25 mg.  We started him on Remeron and he states that did not help but I want him to continue it since it is not a sleeping medication.  He has been on Halcion in the past and that worked but made him too sedated.  He denies any side effects from his current medications.  We recently had started him on Abilify, also.  I discussed trying Klonopin at night since he has failed Seroquel.  Principal Problem: MDD (major depressive disorder), recurrent episode, severe ([place]) Diagnosis: Principal Problem:   MDD (major depressive disorder), recurrent episode, severe ([place]) Active Problems:   Major depressive disorder, recurrent episode, severe ([place])  Total Time spent with patient: 15 minutes  Past Psychiatric History: None  Past Medical History:  Past Medical History:  Diagnosis Date   Atrial fibrillation ([place]) 08/2009   CAD (coronary artery disease)    s/p CABG -- 1992   Depression    ESRD (end stage renal disease) ([place])    HLD (hyperlipidemia)    HTN (hypertension)    Idiopathic thrombocytopenia (HCC)     Past Surgical History:  Procedure Laterality Date   APPENDECTOMY     CORONARY ARTERY BYPASS GRAFT     FACIAL COSMETIC SURGERY     GALLBLADDER SURGERY     HERNIA REPAIR     KIDNEY TRANSPLANT     LEFT HEART CATH AND CORONARY ANGIOGRAPHY N/A 08/07/2018   Procedure: LEFT HEART CATH AND CORONARY ANGIOGRAPHY;  Surgeon: Sherren Mocha, MD;  Location: [place] [facility];  Service: Cardiovascular;  Laterality: N/A;   LIVER TRANSPLANT     PRESSURE SENSOR/CARDIOMEMS N/A 08/04/2021   Procedure: PRESSURE SENSOR/CARDIOMEMS;  Surgeon: Larey Dresser, MD;  Location: [place] [facility];  Service: Cardiovascular;  Laterality: N/A;   RIGHT HEART CATH N/A 06/25/2021   Procedure: RIGHT HEART CATH;  Surgeon:  Larey Dresser, MD;  Location: [place] [facility];  Service: Cardiovascular;  Laterality: N/A;   Family History:  Family History  Problem Relation Age of Onset   Atrial fibrillation Mother    Breast cancer Mother    Renal cancer Father    Hypertension Brother    Hyperlipidemia Brother    Other Brother        stent    Renal Disease Maternal Grandmother    Stroke Maternal Grandmother    Heart attack Maternal Grandfather    Cancer Paternal Grandmother    Heart failure Paternal Grandfather    Emphysema Paternal Grandfather    Bronchiolitis Paternal Grandfather    Social History:  Social History   Substance and Sexual Activity  Alcohol Use No     Social History   Substance and Sexual Activity  Drug Use No    Social History   Socioeconomic History   Marital status: Single    Spouse name: Not on file   Number of children: 0   Years of education: Not on file   Highest education level: Not on file  Occupational History   Occupation: retired  Tobacco Use   Smoking status: Never   Smokeless tobacco: Never  Vaping Use   Vaping Use: Never used  Substance and Sexual Activity   Alcohol use: No   Drug use: No   Sexual activity: Not on file  Other  Topics Concern   Not on file  Social History Narrative   Not on file   Social Determinants of Health   Financial Resource Strain: Low Risk    Difficulty of Paying Living Expenses: Not very hard  Food Insecurity: No Food Insecurity   Worried About Running Out of Food in the Last Year: Never true   Ran Out of Food in the Last Year: Never true  Transportation Needs: No Transportation Needs   Lack of Transportation (Medical): No   Lack of Transportation (Non-Medical): No  Physical Activity: Not on file  Stress: Not on file  Social Connections: Not on file   Additional Social History:                         Sleep: Poor  Appetite:  Good  Current Medications: Current Facility-Administered Medications   Medication Dose Route Frequency Provider Last Rate Last Admin   acetaminophen (TYLENOL) tablet 650 mg  650 mg Oral Q6H PRN Starkes-Perry, Gayland Curry, FNP       allopurinol (ZYLOPRIM) tablet 100 mg  100 mg Oral Daily Suella Broad, FNP   100 mg at 11/19/21 1021   alum & mag hydroxide-simeth (MAALOX/MYLANTA) 200-200-20 MG/5ML suspension 30 mL  30 mL Oral Q4H PRN Starkes-Perry, Gayland Curry, FNP       amiodarone (PACERONE) tablet 200 mg  200 mg Oral Daily Suella Broad, FNP   200 mg at 11/19/21 1021   ARIPiprazole (ABILIFY) tablet 5 mg  5 mg Oral Daily Parks Ranger, DO   5 mg at 11/19/21 1022   atorvastatin (LIPITOR) tablet 20 mg  20 mg Oral QHS Suella Broad, FNP   20 mg at 11/18/21 2205   clonazePAM (KLONOPIN) tablet 1 mg  1 mg Oral QHS Parks Ranger, DO       dapagliflozin propanediol (FARXIGA) tablet 10 mg  10 mg Oral Daily Parks Ranger, DO   10 mg at 11/18/21 6073   ferrous sulfate tablet 325 mg  325 mg Oral Q breakfast Suella Broad, FNP   325 mg at 11/19/21 0756   magnesium hydroxide (MILK OF MAGNESIA) suspension 30 mL  30 mL Oral Daily PRN Suella Broad, FNP       magnesium oxide (MAG-OX) tablet 400 mg  400 mg Oral BID Suella Broad, FNP   400 mg at 11/19/21 1021   mirtazapine (REMERON) tablet 15 mg  15 mg Oral QHS Parks Ranger, DO   15 mg at 11/18/21 2205   omega-3 acid ethyl esters (LOVAZA) capsule 1 g  1 g Oral BID Suella Broad, FNP   1 g at 11/19/21 1022   ondansetron (ZOFRAN-ODT) disintegrating tablet 4 mg  4 mg Oral Q8H PRN Caroline Sauger, NP   4 mg at 11/17/21 1324   potassium chloride SA (KLOR-CON M) CR tablet 40 mEq  40 mEq Oral TID Suella Broad, FNP   40 mEq at 11/19/21 1021   tacrolimus (PROGRAF) capsule 0.5 mg  0.5 mg Oral [name], FNP   0.5 mg at 11/19/21 1022   tacrolimus (PROGRAF) capsule 1 mg  1 mg Oral [name], FNP   1 mg  at 11/18/21 0955   torsemide (DEMADEX) tablet 100 mg  100 mg Oral Daily Suella Broad, FNP   100 mg at 11/19/21 1022   vitamin B-12 (CYANOCOBALAMIN) tablet 1,000 mcg  1,000 mcg Oral Daily Starkes-Perry,  Gayland Curry, FNP   1,000 mcg at 11/19/21 1022   warfarin (COUMADIN) tablet 2 mg  2 mg Oral QPM Suella Broad, FNP   2 mg at 11/18/21 1717   Warfarin - Physician Dosing Inpatient   Does not apply q1600 Rauer, Forde Dandy, [facility]   Given at 11/18/21 1557    Lab Results:  Results for orders placed or performed during the hospital encounter of 11/16/21 (from the past 48 hour(s))  Protime-INR     Status: Abnormal   Collection Time: 11/18/21  6:13 AM  Result Value Ref Range   Prothrombin Time 20.7 (H) 11.4 - 15.2 seconds   INR 1.8 (H) 0.8 - 1.2    Comment: (NOTE) INR goal varies based on device and disease states. Performed at [facility], [address] [place], [place] [address]     Blood Alcohol level:  Lab Results  Component Value Date   [facility] <10 17/40/8144    Metabolic Disorder Labs: Lab Results  Component Value Date   HGBA1C 5.2 11/17/2021   MPG 102.54 11/17/2021   MPG 105.41 08/05/2018   No results found for: PROLACTIN Lab Results  Component Value Date   CHOL 117 11/17/2021   TRIG 96 11/17/2021   HDL 28 (L) 11/17/2021   CHOLHDL 4.2 11/17/2021   VLDL 19 11/17/2021   LDLCALC 70 11/17/2021   LDLCALC 87 08/22/2019    Physical Findings: AIMS:  , ,  ,  ,    CIWA:    COWS:     Musculoskeletal: Strength & Muscle Tone: within normal limits Gait & Station: normal Patient leans: N/A  Psychiatric Specialty Exam:  Presentation  General Appearance: No data recorded Eye Contact:No data recorded Speech:No data recorded Speech Volume:No data recorded Handedness:No data recorded  Mood and Affect  Mood:No data recorded Affect:No data recorded  Thought Process  Thought Processes:No data recorded Descriptions of Associations:No data  recorded Orientation:No data recorded Thought Content:No data recorded History of Schizophrenia/Schizoaffective disorder:No  Duration of Psychotic Symptoms:No data recorded Hallucinations:No data recorded Ideas of Reference:No data recorded Suicidal Thoughts:No data recorded Homicidal Thoughts:No data recorded  Sensorium  Memory:No data recorded Judgment:No data recorded Insight:No data recorded  Executive Functions  Concentration:No data recorded Attention Span:No data recorded Recall:No data recorded Fund of Knowledge:No data recorded Language:No data recorded  Psychomotor Activity  Psychomotor Activity:No data recorded  Assets  Assets:No data recorded  Sleep  Sleep:No data recorded   Physical Exam: Physical Exam Vitals and nursing note reviewed.  Constitutional:      Appearance: Normal appearance. He is normal weight.  Neurological:     General: No focal deficit present.     Mental Status: He is alert and oriented to person, place, and time.  Psychiatric:        Attention and Perception: Attention and perception normal.        Mood and Affect: Mood is anxious and depressed. Affect is flat.        Speech: Speech normal.        Behavior: Behavior normal. Behavior is cooperative.        Thought Content: Thought content normal.        Cognition and Memory: Cognition and memory normal.        Judgment: Judgment normal.   Review of Systems  Constitutional: Negative.   HENT: Negative.    Eyes: Negative.   Respiratory: Negative.    Cardiovascular: Negative.   Gastrointestinal: Negative.   Genitourinary: Negative.   Musculoskeletal:  Negative.   Skin: Negative.   Neurological: Negative.   Endo/Heme/Allergies: Negative.   Psychiatric/Behavioral:  Positive for depression.   Blood pressure 122/67, pulse 82, temperature (!) 97.4 F (36.3 C), temperature source Oral, resp. rate 18, height 5\' 10"  (1.778 m), weight 82.6 kg, SpO2 94 %. Body mass index is 26.11  kg/m.   Treatment Plan Summary: Daily contact with patient to assess and evaluate symptoms and progress in treatment, Medication management, and Plan start Klonopin 1 mg at bedtime.  Continue Remeron 15 mg at bedtime.  Continue Abilify 5 mg in the morning.  Discontinue Ativan and melatonin.  Parks Ranger, DO 11/19/2021, 11:19 AM

## 2021-11-20 DIAGNOSIS — F332 Major depressive disorder, recurrent severe without psychotic features: Secondary | ICD-10-CM | POA: Diagnosis not present

## 2021-11-20 NOTE — Progress Notes (Signed)
Recreation Therapy Notes   Date: 11/20/2021  Time: 1:30 pm    Location: Craft room   Behavioral response: Appropriate  Intervention Topic:  Communication     Discussion/Intervention:  Group content today was focused on communication. The group defined communication and ways to communicate with others. Individuals stated reason why communication is important and some reasons to communicate with others. Patients expressed if they thought they were good at communicating with others and ways they could improve their communication skills. The group identified important parts of communication and some experiences they have had in the past with communication. The group participated in the intervention What is that?, where they had a chance to test out their communication skills and identify ways to improve their communication techniques.  Clinical Observations/Feedback: Patient came to group late and was focused on what peers and staff had to say communication. Individual was social with peers and staff while participating in the intervention.    Alayha Babineaux LRT/CTRS         Deion Forgue 11/20/2021 3:16 PM

## 2021-11-20 NOTE — Progress Notes (Signed)
Patient is A &O x4. Pt c/o "little" anxiety. Pt is cooperative and calm. Pt denies SI, HI, and AVH at this time. Pt remains safe on the unit at this time.

## 2021-11-20 NOTE — Progress Notes (Signed)
[facility] MD Progress Note  11/20/2021 1:26 PM [name]  MRN:  [id number] Subjective: [name] states that he slept better last night.  He has failed trazodone, Ambien, and Seroquel in the past.  I tried him on Klonopin 1 mg at bedtime and he did do better with this.  He feels that his mood is slightly better.  He is able to contract for safety in the hospital.  He is also on Remeron and Abilify and denies any side effects.  Principal Problem: MDD (major depressive disorder), recurrent episode, severe ([place]) Diagnosis: Principal Problem:   MDD (major depressive disorder), recurrent episode, severe ([place]) Active Problems:   Major depressive disorder, recurrent episode, severe ([place])  Total Time spent with patient: 15 minutes  Past Psychiatric History: None  Past Medical History:  Past Medical History:  Diagnosis Date   Atrial fibrillation ([place]) 08/2009   CAD (coronary artery disease)    s/p CABG -- 1992   Depression    ESRD (end stage renal disease) ([place])    HLD (hyperlipidemia)    HTN (hypertension)    Idiopathic thrombocytopenia (HCC)     Past Surgical History:  Procedure Laterality Date   APPENDECTOMY     CORONARY ARTERY BYPASS GRAFT     FACIAL COSMETIC SURGERY     GALLBLADDER SURGERY     HERNIA REPAIR     KIDNEY TRANSPLANT     LEFT HEART CATH AND CORONARY ANGIOGRAPHY N/A 08/07/2018   Procedure: LEFT HEART CATH AND CORONARY ANGIOGRAPHY;  Surgeon: Sherren Mocha, MD;  Location: [place] [facility];  Service: Cardiovascular;  Laterality: N/A;   LIVER TRANSPLANT     PRESSURE SENSOR/CARDIOMEMS N/A 08/04/2021   Procedure: PRESSURE SENSOR/CARDIOMEMS;  Surgeon: Larey Dresser, MD;  Location: [place] [facility];  Service: Cardiovascular;  Laterality: N/A;   RIGHT HEART CATH N/A 06/25/2021   Procedure: RIGHT HEART CATH;  Surgeon: Larey Dresser, MD;  Location: [place] [facility];  Service: Cardiovascular;  Laterality: N/A;   Family History:  Family History  Problem Relation Age  of Onset   Atrial fibrillation Mother    Breast cancer Mother    Renal cancer Father    Hypertension Brother    Hyperlipidemia Brother    Other Brother        stent    Renal Disease Maternal Grandmother    Stroke Maternal Grandmother    Heart attack Maternal Grandfather    Cancer Paternal Grandmother    Heart failure Paternal Grandfather    Emphysema Paternal Grandfather    Bronchiolitis Paternal Grandfather     Social History:  Social History   Substance and Sexual Activity  Alcohol Use No     Social History   Substance and Sexual Activity  Drug Use No    Social History   Socioeconomic History   Marital status: Single    Spouse name: Not on file   Number of children: 0   Years of education: Not on file   Highest education level: Not on file  Occupational History   Occupation: retired  Tobacco Use   Smoking status: Never   Smokeless tobacco: Never  Vaping Use   Vaping Use: Never used  Substance and Sexual Activity   Alcohol use: No   Drug use: No   Sexual activity: Not on file  Other Topics Concern   Not on file  Social History Narrative   Not on file   Social Determinants of Health   Financial Resource Strain:  Low Risk    Difficulty of Paying Living Expenses: Not very hard  Food Insecurity: No Food Insecurity   Worried About Running Out of Food in the Last Year: Never true   Ran Out of Food in the Last Year: Never true  Transportation Needs: No Transportation Needs   Lack of Transportation (Medical): No   Lack of Transportation (Non-Medical): No  Physical Activity: Not on file  Stress: Not on file  Social Connections: Not on file   Additional Social History:                         Sleep: Good  Appetite:  Good  Current Medications: Current Facility-Administered Medications  Medication Dose Route Frequency Provider Last Rate Last Admin   acetaminophen (TYLENOL) tablet 650 mg  650 mg Oral Q6H PRN Starkes-Perry, Gayland Curry, FNP        allopurinol (ZYLOPRIM) tablet 100 mg  100 mg Oral Daily Suella Broad, FNP   100 mg at 11/20/21 1009   alum & mag hydroxide-simeth (MAALOX/MYLANTA) 200-200-20 MG/5ML suspension 30 mL  30 mL Oral Q4H PRN Starkes-Perry, Gayland Curry, FNP       amiodarone (PACERONE) tablet 200 mg  200 mg Oral Daily Suella Broad, FNP   200 mg at 11/20/21 1009   ARIPiprazole (ABILIFY) tablet 5 mg  5 mg Oral Daily Parks Ranger, DO   5 mg at 11/20/21 1009   atorvastatin (LIPITOR) tablet 20 mg  20 mg Oral QHS Suella Broad, FNP   20 mg at 11/19/21 2200   clonazePAM (KLONOPIN) tablet 1 mg  1 mg Oral QHS Parks Ranger, DO   1 mg at 11/19/21 2201   dapagliflozin propanediol (FARXIGA) tablet 10 mg  10 mg Oral Daily Parks Ranger, DO   10 mg at 11/20/21 1009   ferrous sulfate tablet 325 mg  325 mg Oral Q breakfast Suella Broad, FNP   325 mg at 11/20/21 0817   magnesium hydroxide (MILK OF MAGNESIA) suspension 30 mL  30 mL Oral Daily PRN Suella Broad, FNP       magnesium oxide (MAG-OX) tablet 400 mg  400 mg Oral BID Suella Broad, FNP   400 mg at 11/20/21 1009   mirtazapine (REMERON) tablet 15 mg  15 mg Oral QHS Parks Ranger, DO   15 mg at 11/19/21 2200   omega-3 acid ethyl esters (LOVAZA) capsule 1 g  1 g Oral BID Suella Broad, FNP   1 g at 11/20/21 1009   ondansetron (ZOFRAN-ODT) disintegrating tablet 4 mg  4 mg Oral Q8H PRN Caroline Sauger, NP   4 mg at 11/17/21 1324   potassium chloride SA (KLOR-CON M) CR tablet 40 mEq  40 mEq Oral TID Suella Broad, FNP   40 mEq at 11/20/21 1009   tacrolimus (PROGRAF) capsule 0.5 mg  0.5 mg Oral [name], FNP   0.5 mg at 11/19/21 1022   tacrolimus (PROGRAF) capsule 1 mg  1 mg Oral [name], FNP   1 mg at 11/20/21 1009   torsemide (DEMADEX) tablet 100 mg  100 mg Oral Daily Suella Broad, FNP   100 mg at 11/20/21 1010   vitamin  B-12 (CYANOCOBALAMIN) tablet 1,000 mcg  1,000 mcg Oral Daily Suella Broad, FNP   1,000 mcg at 11/20/21 1010   warfarin (COUMADIN) tablet 2 mg  2 mg Oral QPM Sheran Fava  S, FNP   2 mg at 11/19/21 1719   Warfarin - Physician Dosing Inpatient   Does not apply [id number] RauerForde Dandy, [facility]   Given at 11/19/21 1719    Lab Results: No results found for this or any previous visit (from the past 7 hour(s)).  Blood Alcohol level:  Lab Results  Component Value Date   ETH <10 96/78/9381    Metabolic Disorder Labs: Lab Results  Component Value Date   HGBA1C 5.2 11/17/2021   MPG 102.54 11/17/2021   MPG 105.41 08/05/2018   No results found for: PROLACTIN Lab Results  Component Value Date   CHOL 117 11/17/2021   TRIG 96 11/17/2021   HDL 28 (L) 11/17/2021   CHOLHDL 4.2 11/17/2021   VLDL 19 11/17/2021   LDLCALC 70 11/17/2021   LDLCALC 87 08/22/2019    Physical Findings: AIMS:  , ,  ,  ,    CIWA:    COWS:     Musculoskeletal: Strength & Muscle Tone: within normal limits Gait & Station: normal Patient leans: N/A  Psychiatric Specialty Exam:  Presentation  General Appearance: No data recorded Eye Contact:No data recorded Speech:No data recorded Speech Volume:No data recorded Handedness:No data recorded  Mood and Affect  Mood:No data recorded Affect:No data recorded  Thought Process  Thought Processes:No data recorded Descriptions of Associations:No data recorded Orientation:No data recorded Thought Content:No data recorded History of Schizophrenia/Schizoaffective disorder:No  Duration of Psychotic Symptoms:No data recorded Hallucinations:No data recorded Ideas of Reference:No data recorded Suicidal Thoughts:No data recorded Homicidal Thoughts:No data recorded  Sensorium  Memory:No data recorded Judgment:No data recorded Insight:No data recorded  Executive Functions  Concentration:No data recorded Attention Span:No data recorded Recall:No data  recorded Fund of Knowledge:No data recorded Language:No data recorded  Psychomotor Activity  Psychomotor Activity:No data recorded  Assets  Assets:No data recorded  Sleep  Sleep:No data recorded   Physical Exam: Physical Exam Vitals and nursing note reviewed.  Constitutional:      Appearance: Normal appearance. He is normal weight.  Neurological:     General: No focal deficit present.     Mental Status: He is alert and oriented to person, place, and time.  Psychiatric:        Attention and Perception: Attention and perception normal.        Mood and Affect: Mood is anxious and depressed. Affect is flat.        Speech: Speech normal.        Behavior: Behavior normal. Behavior is cooperative.        Thought Content: Thought content normal.        Cognition and Memory: Cognition and memory normal.        Judgment: Judgment normal.   Review of Systems  Constitutional: Negative.   HENT: Negative.    Eyes: Negative.   Respiratory: Negative.    Cardiovascular: Negative.   Gastrointestinal: Negative.   Genitourinary: Negative.   Musculoskeletal: Negative.   Skin: Negative.   Neurological: Negative.   Endo/Heme/Allergies: Negative.   Psychiatric/Behavioral:  Positive for depression.    Blood pressure 108/70, pulse 75, temperature 98.5 F (36.9 C), temperature source Oral, resp. rate 18, height 5\' 10"  (1.778 m), weight 82.6 kg, SpO2 94 %. Body mass index is 26.11 kg/m.   Treatment Plan Summary: Daily contact with patient to assess and evaluate symptoms and progress in treatment, Medication management, and Plan continue current medications.  [place], DO 11/20/2021, 1:26 PM

## 2021-11-20 NOTE — Progress Notes (Signed)
Patient is alert and oriented. Denies pain, anxiety and depression during assessment. Denies SI, HI, and AVH. Compliant with due medications and tolerated well. Ate meals in the day room among staff and peers. Remain safe on the unit with q15 minute safety checks.

## 2021-11-20 NOTE — Progress Notes (Signed)
Pt sitting in day room; calm, cooperative. Pt states " Pt denies S/Hi/AVH at this time. Pt states "I'm alright." Pt denies pain and SI/HI/AVH at this time. Pt reports that he slept good last night after taking his PRN sleep. Pt states that his appetite is "not good at all." Pt declined offer for nurse to request nutritional supplement. Pt expresses feelings of anxiety, which he rates 6/10 due to sleep and depression, which he rates 5/10 and is related to his anxiety. Pt reports that his last BM was yesterday. He reports that his regularity depends on "how much fluid he drinks" and says that he is on fluid restriction due to CHF. No acute distress noted.

## 2021-11-21 DIAGNOSIS — F332 Major depressive disorder, recurrent severe without psychotic features: Secondary | ICD-10-CM | POA: Diagnosis not present

## 2021-11-21 MED ORDER — TRAZODONE HCL 50 MG PO TABS
100.0000 mg | ORAL_TABLET | Freq: Every evening | ORAL | Status: DC | PRN
  Administered 2021-11-21 – 2021-11-25 (×4): 100 mg via ORAL
  Filled 2021-11-21 (×4): qty 2

## 2021-11-21 NOTE — Progress Notes (Signed)
Patient is seen in the dayroom interacting appropriately with others. Pt is A&O x4. Pt denies SI, HI, and AVH. Pt said that he has not been sleeping well and would like to get some rest. Pt remains safe on the unit at this time.

## 2021-11-21 NOTE — Group Note (Signed)
LCSW Group Therapy Note  Group Date: 11/21/2021 Start Time: 1300 End Time: 1400   Type of Therapy and Topic:  Group Therapy - Healthy vs Unhealthy Coping Skills  Participation Level:  Did Not Attend   Description of Group The focus of this group was to determine what unhealthy coping techniques typically are used by group members and what healthy coping techniques would be helpful in coping with various problems. Patients were guided in becoming aware of the differences between healthy and unhealthy coping techniques. Patients were asked to identify 2-3 healthy coping skills they would like to learn to use more effectively.  Therapeutic Goals Patients learned that coping is what human beings do all day long to deal with various situations in their lives Patients defined and discussed healthy vs unhealthy coping techniques Patients identified their preferred coping techniques and identified whether these were healthy or unhealthy Patients determined 2-3 healthy coping skills they would like to become more familiar with and use more often. Patients provided support and ideas to each other   Summary of Patient Progress:   X   Therapeutic Modalities Cognitive Behavioral Therapy Motivational Interviewing  Rozann Lesches, Latanya Presser 11/21/2021  1:58 PM

## 2021-11-21 NOTE — Progress Notes (Signed)
Patient is alert and oriented times 4. Mood and affect appropriate. Patient rates pain as 5/10 to right hip, but declines tylenol at this time.[name] He denies SI, HI, and AVH. Also denies feelings of anxiety and depression at this time. States he didn't get much sleep last night. Morning meds given whole by mouth W/O difficulty. Ate breakfast in day room- appetite good. Patient remains on unit with Q15 minute checks in place.

## 2021-11-21 NOTE — Progress Notes (Signed)
[facility] MD Progress Note  11/21/2021 12:41 PM [name]  MRN:  [id number] Subjective: Follow-up for 71 year old man currently in the hospital for depression.  Patient seen and chart reviewed.  Patient tells me he became depressed at home and he thinks it is because he inadvertently stopped taking his antidepressant medicine.  He is voluntarily here and is cooperative with current treatment for depression.  Patient says he is still not sleeping well.  That his chief complaint at the moment.  No other physical complaints.  Appears to be ambulatory and interacting appropriately on the unit.  Labs reviewed nothing new.  Nursing was concerned that had been several days since his last PT INR evaluation. Principal Problem: MDD (major depressive disorder), recurrent episode, severe ([place]) Diagnosis: Principal Problem:   MDD (major depressive disorder), recurrent episode, severe ([place]) Active Problems:   Major depressive disorder, recurrent episode, severe ([place])  Total Time spent with patient: 30 minutes  Past Psychiatric History: Past history of recurrent depression  Past Medical History:  Past Medical History:  Diagnosis Date   Atrial fibrillation ([place]) 08/2009   CAD (coronary artery disease)    s/p CABG -- 1992   Depression    ESRD (end stage renal disease) ([place])    HLD (hyperlipidemia)    HTN (hypertension)    Idiopathic thrombocytopenia ([place])     Past Surgical History:  Procedure Laterality Date   APPENDECTOMY     CORONARY ARTERY BYPASS GRAFT     FACIAL COSMETIC SURGERY     GALLBLADDER SURGERY     HERNIA REPAIR     KIDNEY TRANSPLANT     LEFT HEART CATH AND CORONARY ANGIOGRAPHY N/A 08/07/2018   Procedure: LEFT HEART CATH AND CORONARY ANGIOGRAPHY;  Surgeon: Sherren Mocha, MD;  Location: [facility];  Service: Cardiovascular;  Laterality: N/A;   LIVER TRANSPLANT     PRESSURE SENSOR/CARDIOMEMS N/A 08/04/2021   Procedure: PRESSURE SENSOR/CARDIOMEMS;  Surgeon: Larey Dresser, MD;  Location: [facility];  Service: Cardiovascular;  Laterality: N/A;   RIGHT HEART CATH N/A 06/25/2021   Procedure: RIGHT HEART CATH;  Surgeon: Larey Dresser, MD;  Location: [facility];  Service: Cardiovascular;  Laterality: N/A;   Family History:  Family History  Problem Relation Age of Onset   Atrial fibrillation Mother    Breast cancer Mother    Renal cancer Father    Hypertension Brother    Hyperlipidemia Brother    Other Brother        stent    Renal Disease Maternal Grandmother    Stroke Maternal Grandmother    Heart attack Maternal Grandfather    Cancer Paternal Grandmother    Heart failure Paternal Grandfather    Emphysema Paternal Grandfather    Bronchiolitis Paternal Grandfather    Family Psychiatric  History: See previous Social History:  Social History   Substance and Sexual Activity  Alcohol Use No     Social History   Substance and Sexual Activity  Drug Use No    Social History   Socioeconomic History   Marital status: Single    Spouse name: Not on file   Number of children: 0   Years of education: Not on file   Highest education level: Not on file  Occupational History   Occupation: retired  Tobacco Use   Smoking status: Never   Smokeless tobacco: Never  Vaping Use   Vaping Use: Never used  Substance and Sexual Activity   Alcohol  use: No   Drug use: No   Sexual activity: Not on file  Other Topics Concern   Not on file  Social History Narrative   Not on file   Social Determinants of Health   Financial Resource Strain: Low Risk    Difficulty of Paying Living Expenses: Not very hard  Food Insecurity: No Food Insecurity   Worried About Running Out of Food in the Last Year: Never true   Ran Out of Food in the Last Year: Never true  Transportation Needs: No Transportation Needs   Lack of Transportation (Medical): No   Lack of Transportation (Non-Medical): No  Physical Activity: Not on file  Stress: Not on file   Social Connections: Not on file   Additional Social History:                         Sleep: Fair  Appetite:  Fair  Current Medications: Current Facility-Administered Medications  Medication Dose Route Frequency Provider Last Rate Last Admin   acetaminophen (TYLENOL) tablet 650 mg  650 mg Oral Q6H PRN Starkes-Perry, Gayland Curry, FNP       allopurinol (ZYLOPRIM) tablet 100 mg  100 mg Oral Daily Suella Broad, FNP   100 mg at 11/21/21 0921   alum & mag hydroxide-simeth (MAALOX/MYLANTA) 200-200-20 MG/5ML suspension 30 mL  30 mL Oral Q4H PRN Suella Broad, FNP       amiodarone (PACERONE) tablet 200 mg  200 mg Oral Daily Suella Broad, FNP   200 mg at 11/21/21 5621   ARIPiprazole (ABILIFY) tablet 5 mg  5 mg Oral Daily Parks Ranger, DO   5 mg at 11/21/21 3086   atorvastatin (LIPITOR) tablet 20 mg  20 mg Oral QHS Suella Broad, FNP   20 mg at 11/20/21 2203   clonazePAM (KLONOPIN) tablet 1 mg  1 mg Oral QHS Parks Ranger, DO   1 mg at 11/20/21 2203   dapagliflozin propanediol (FARXIGA) tablet 10 mg  10 mg Oral Daily Parks Ranger, DO   10 mg at 11/21/21 5784   ferrous sulfate tablet 325 mg  325 mg Oral Q breakfast Suella Broad, FNP   325 mg at 11/21/21 6962   magnesium hydroxide (MILK OF MAGNESIA) suspension 30 mL  30 mL Oral Daily PRN Suella Broad, FNP       magnesium oxide (MAG-OX) tablet 400 mg  400 mg Oral BID Suella Broad, FNP   400 mg at 11/21/21 9528   mirtazapine (REMERON) tablet 15 mg  15 mg Oral QHS Parks Ranger, DO   15 mg at 11/20/21 2203   omega-3 acid ethyl esters (LOVAZA) capsule 1 g  1 g Oral BID Suella Broad, FNP   1 g at 11/21/21 0923   ondansetron (ZOFRAN-ODT) disintegrating tablet 4 mg  4 mg Oral Q8H PRN Caroline Sauger, NP   4 mg at 11/17/21 1324   potassium chloride SA (KLOR-CON M) CR tablet 40 mEq  40 mEq Oral TID Suella Broad, FNP   40  mEq at 11/21/21 0922   tacrolimus (PROGRAF) capsule 0.5 mg  0.5 mg Oral [name], FNP   0.5 mg at 11/21/21 0923   tacrolimus (PROGRAF) capsule 1 mg  1 mg Oral [name], FNP   1 mg at 11/20/21 1009   torsemide (DEMADEX) tablet 100 mg  100 mg Oral Daily Suella Broad, FNP  100 mg at 11/21/21 1696   traZODone (DESYREL) tablet 100 mg  100 mg Oral QHS PRN Kaelee Pfeffer, Madie Reno, MD       vitamin B-12 (CYANOCOBALAMIN) tablet 1,000 mcg  1,000 mcg Oral Daily Suella Broad, FNP   1,000 mcg at 11/21/21 7893   warfarin (COUMADIN) tablet 2 mg  2 mg Oral QPM Suella Broad, FNP   2 mg at 11/20/21 1813   Warfarin - Physician Dosing Inpatient   Does not apply q1600 RauerForde Dandy, [facility]   Given at 11/20/21 1701    Lab Results: No results found for this or any previous visit (from the past 48 hour(s)).  Blood Alcohol level:  Lab Results  Component Value Date   ETH <10 81/11/7508    Metabolic Disorder Labs: Lab Results  Component Value Date   HGBA1C 5.2 11/17/2021   MPG 102.54 11/17/2021   MPG 105.41 08/05/2018   No results found for: PROLACTIN Lab Results  Component Value Date   CHOL 117 11/17/2021   TRIG 96 11/17/2021   HDL 28 (L) 11/17/2021   CHOLHDL 4.2 11/17/2021   VLDL 19 11/17/2021   LDLCALC 70 11/17/2021   LDLCALC 87 08/22/2019    Physical Findings: AIMS:  , ,  ,  ,    CIWA:    COWS:     Musculoskeletal: Strength & Muscle Tone: within normal limits Gait & Station: normal Patient leans: N/A  Psychiatric Specialty Exam:  Presentation  General Appearance: No data recorded Eye Contact:No data recorded Speech:No data recorded Speech Volume:No data recorded Handedness:No data recorded  Mood and Affect  Mood:No data recorded Affect:No data recorded  Thought Process  Thought Processes:No data recorded Descriptions of Associations:No data recorded Orientation:No data recorded Thought Content:No data  recorded History of Schizophrenia/Schizoaffective disorder:No  Duration of Psychotic Symptoms:No data recorded Hallucinations:No data recorded Ideas of Reference:No data recorded Suicidal Thoughts:No data recorded Homicidal Thoughts:No data recorded  Sensorium  Memory:No data recorded Judgment:No data recorded Insight:No data recorded  Executive Functions  Concentration:No data recorded Attention Span:No data recorded Recall:No data recorded Fund of Knowledge:No data recorded Language:No data recorded  Psychomotor Activity  Psychomotor Activity:No data recorded  Assets  Assets:No data recorded  Sleep  Sleep:No data recorded   Physical Exam: Physical Exam Vitals and nursing note reviewed.  Constitutional:      Appearance: Normal appearance.  HENT:     Head: Normocephalic and atraumatic.     Mouth/Throat:     Pharynx: Oropharynx is clear.  Eyes:     Pupils: Pupils are equal, round, and reactive to light.  Cardiovascular:     Rate and Rhythm: Normal rate and regular rhythm.  Pulmonary:     Effort: Pulmonary effort is normal.     Breath sounds: Normal breath sounds.  Abdominal:     General: Abdomen is flat.     Palpations: Abdomen is soft.  Musculoskeletal:        General: Normal range of motion.  Skin:    General: Skin is warm and dry.  Neurological:     General: No focal deficit present.     Mental Status: He is alert. Mental status is at baseline.  Psychiatric:        Attention and Perception: Attention normal.        Mood and Affect: Mood normal. Affect is blunt.        Speech: Speech normal.        Behavior: Behavior is cooperative.  Thought Content: Thought content normal. Thought content does not include homicidal or suicidal ideation.        Cognition and Memory: Cognition normal.        Judgment: Judgment normal.   Review of Systems  Constitutional: Negative.   HENT: Negative.    Eyes: Negative.   Respiratory: Negative.     Cardiovascular: Negative.   Gastrointestinal: Negative.   Musculoskeletal: Negative.   Skin: Negative.   Neurological: Negative.   Psychiatric/Behavioral:  Positive for depression. Negative for hallucinations, substance abuse and suicidal ideas. The patient is nervous/anxious and has insomnia.   Blood pressure 136/73, pulse 94, temperature 97.9 F (36.6 C), temperature source Oral, resp. rate 18, height 5\' 10"  (1.778 m), weight 82.6 kg, SpO2 99 %. Body mass index is 26.11 kg/m.   Treatment Plan Summary: Medication management and Plan medication reviewed.  Currently on appropriate medication plan for treating depression.  Because of complaints of poor sleep I did add an order for trazodone as a as needed for sleep.  Ordered another PT INR test to be done tomorrow morning.  Supportive therapy.  No other change to plan.  Alethia Berthold, MD 11/21/2021, 12:41 PM

## 2021-11-22 DIAGNOSIS — F332 Major depressive disorder, recurrent severe without psychotic features: Secondary | ICD-10-CM | POA: Diagnosis not present

## 2021-11-22 LAB — PROTIME-INR
INR: 2 — ABNORMAL HIGH (ref 0.8–1.2)
Prothrombin Time: 22.5 seconds — ABNORMAL HIGH (ref 11.4–15.2)

## 2021-11-22 MED ORDER — ARIPIPRAZOLE 5 MG PO TABS
5.0000 mg | ORAL_TABLET | Freq: Every day | ORAL | Status: DC
  Administered 2021-11-23 – 2021-11-25 (×3): 5 mg via ORAL
  Filled 2021-11-22 (×3): qty 1

## 2021-11-22 NOTE — Progress Notes (Signed)
Patient is seen in the dayroom watching TV and interacting appropriately with others. Pt voiced no complaints to this Probation officer. He denies SI, HI, and AVH. Pt remains safe on the unit at this time.

## 2021-11-22 NOTE — Progress Notes (Signed)
Patient remains alert and oriented x4. Calm and cooperative. Denies pain, anxiety and depression. Denies SI, HI, and AVH. Ate breakfast in the day room among peers with good appetite. Compliant with medications. Remain safe on the unit with q15 minute safety check.

## 2021-11-22 NOTE — Group Note (Signed)
LCSW Group Therapy Note  Group Date: 11/22/2021 Start Time: 3254 End Time: 1400   Type of Therapy and Topic:  Group Therapy - How To Cope with Nervousness about Discharge   Participation Level:  Active   Description of Group This process group involved identification of patients' feelings about discharge. Some of them are scheduled to be discharged soon, while others are new admissions, but each of them was asked to share thoughts and feelings surrounding discharge from the hospital. One common theme was that they are excited at the prospect of going home, while another was that many of them are apprehensive about sharing why they were hospitalized. Patients were given the opportunity to discuss these feelings with their peers in preparation for discharge.  Therapeutic Goals  Patient will identify their overall feelings about pending discharge. Patient will think about how they might proactively address issues that they believe will once again arise once they get home (i.e. with parents). Patients will participate in discussion about having hope for change.   Summary of Patient Progress: Patient was present for the entirety of the group session. Patient was an active listener and participated in the topic of discussion. Patient expressed apprehension at the idea of going home, sharing that he has slept all day the past few days and typically has difficulty sleeping at home. Patient shared he is concerned that he will not be able to sleep as well once he returns home. Patient was receptive to psycho-education about sleep hygiene and expressed interest in following a routine to promote better sleep. Patient shared that he plans to exercise after he discharges from the hospital by walking in the sun and also has a plan to join a gym.   Therapeutic Modalities Cognitive Behavioral Therapy   Berniece Salines, Latanya Presser 11/22/2021  2:19 PM

## 2021-11-22 NOTE — Progress Notes (Signed)
[facility] MD Progress Note  11/22/2021 3:18 PM [name]  MRN:  [id number] Subjective: Follow-up this patient with depression.  Patient seen and chart reviewed.  Patient says he feels emotionally better with less depression and no suicidal ideation.  He does complain of feeling "tired all the time".  He says he feels like sleeping and staying in bed much of the time.  He was in bed when I saw him in the afternoon but has been up earlier in the day going to groups.  Patient was unsure what it could be related to had no specific complaints to medicine.  He did feel like he was sleeping better at nighttime. Principal Problem: MDD (major depressive disorder), recurrent episode, severe ([place]) Diagnosis: Principal Problem:   MDD (major depressive disorder), recurrent episode, severe ([place]) Active Problems:   Major depressive disorder, recurrent episode, severe ([place])  Total Time spent with patient: 30 minutes  Past Psychiatric History: Past history of depression  Past Medical History:  Past Medical History:  Diagnosis Date   Atrial fibrillation ([place]) 08/2009   CAD (coronary artery disease)    s/p CABG -- 1992   Depression    ESRD (end stage renal disease) ([place])    HLD (hyperlipidemia)    HTN (hypertension)    Idiopathic thrombocytopenia ([place])     Past Surgical History:  Procedure Laterality Date   APPENDECTOMY     CORONARY ARTERY BYPASS GRAFT     FACIAL COSMETIC SURGERY     GALLBLADDER SURGERY     HERNIA REPAIR     KIDNEY TRANSPLANT     LEFT HEART CATH AND CORONARY ANGIOGRAPHY N/A 08/07/2018   Procedure: LEFT HEART CATH AND CORONARY ANGIOGRAPHY;  Surgeon: Sherren Mocha, MD;  Location: [facility];  Service: Cardiovascular;  Laterality: N/A;   LIVER TRANSPLANT     PRESSURE SENSOR/CARDIOMEMS N/A 08/04/2021   Procedure: PRESSURE SENSOR/CARDIOMEMS;  Surgeon: Larey Dresser, MD;  Location: [facility];  Service: Cardiovascular;  Laterality: N/A;   RIGHT HEART CATH N/A  06/25/2021   Procedure: RIGHT HEART CATH;  Surgeon: Larey Dresser, MD;  Location: [facility];  Service: Cardiovascular;  Laterality: N/A;   Family History:  Family History  Problem Relation Age of Onset   Atrial fibrillation Mother    Breast cancer Mother    Renal cancer Father    Hypertension Brother    Hyperlipidemia Brother    Other Brother        stent    Renal Disease Maternal Grandmother    Stroke Maternal Grandmother    Heart attack Maternal Grandfather    Cancer Paternal Grandmother    Heart failure Paternal Grandfather    Emphysema Paternal Grandfather    Bronchiolitis Paternal Grandfather    Family Psychiatric  History: See previous Social History:  Social History   Substance and Sexual Activity  Alcohol Use No     Social History   Substance and Sexual Activity  Drug Use No    Social History   Socioeconomic History   Marital status: Single    Spouse name: Not on file   Number of children: 0   Years of education: Not on file   Highest education level: Not on file  Occupational History   Occupation: retired  Tobacco Use   Smoking status: Never   Smokeless tobacco: Never  Vaping Use   Vaping Use: Never used  Substance and Sexual Activity   Alcohol use: No   Drug  use: No   Sexual activity: Not on file  Other Topics Concern   Not on file  Social History Narrative   Not on file   Social Determinants of Health   Financial Resource Strain: Low Risk    Difficulty of Paying Living Expenses: Not very hard  Food Insecurity: No Food Insecurity   Worried About Running Out of Food in the Last Year: Never true   Ran Out of Food in the Last Year: Never true  Transportation Needs: No Transportation Needs   Lack of Transportation (Medical): No   Lack of Transportation (Non-Medical): No  Physical Activity: Not on file  Stress: Not on file  Social Connections: Not on file   Additional Social History:                         Sleep:  Fair  Appetite:  Fair  Current Medications: Current Facility-Administered Medications  Medication Dose Route Frequency Provider Last Rate Last Admin   acetaminophen (TYLENOL) tablet 650 mg  650 mg Oral Q6H PRN Suella Broad, FNP   650 mg at 11/21/21 2202   allopurinol (ZYLOPRIM) tablet 100 mg  100 mg Oral Daily Suella Broad, FNP   100 mg at 11/22/21 0954   alum & mag hydroxide-simeth (MAALOX/MYLANTA) 200-200-20 MG/5ML suspension 30 mL  30 mL Oral Q4H PRN Suella Broad, FNP       amiodarone (PACERONE) tablet 200 mg  200 mg Oral Daily Suella Broad, FNP   200 mg at 11/22/21 0954   [START ON 11/23/2021] ARIPiprazole (ABILIFY) tablet 5 mg  5 mg Oral QHS Amaru Burroughs T, MD       atorvastatin (LIPITOR) tablet 20 mg  20 mg Oral QHS Suella Broad, FNP   20 mg at 11/21/21 2202   clonazePAM (KLONOPIN) tablet 1 mg  1 mg Oral QHS Parks Ranger, DO   1 mg at 11/21/21 2201   dapagliflozin propanediol (FARXIGA) tablet 10 mg  10 mg Oral Daily Parks Ranger, DO   10 mg at 11/22/21 5631   ferrous sulfate tablet 325 mg  325 mg Oral Q breakfast Suella Broad, FNP   325 mg at 11/22/21 4970   magnesium hydroxide (MILK OF MAGNESIA) suspension 30 mL  30 mL Oral Daily PRN Suella Broad, FNP       magnesium oxide (MAG-OX) tablet 400 mg  400 mg Oral BID Suella Broad, FNP   400 mg at 11/22/21 0954   mirtazapine (REMERON) tablet 15 mg  15 mg Oral QHS Parks Ranger, DO   15 mg at 11/21/21 2202   omega-3 acid ethyl esters (LOVAZA) capsule 1 g  1 g Oral BID Suella Broad, FNP   1 g at 11/22/21 0956   ondansetron (ZOFRAN-ODT) disintegrating tablet 4 mg  4 mg Oral Q8H PRN Caroline Sauger, NP   4 mg at 11/17/21 1324   potassium chloride SA (KLOR-CON M) CR tablet 40 mEq  40 mEq Oral TID Suella Broad, FNP   40 mEq at 11/22/21 0954   tacrolimus (PROGRAF) capsule 0.5 mg  0.5 mg Oral [name], FNP   0.5 mg at 11/21/21 0923   tacrolimus (PROGRAF) capsule 1 mg  1 mg Oral [name], FNP   1 mg at 11/22/21 0955   torsemide (DEMADEX) tablet 100 mg  100 mg Oral Daily Suella Broad, FNP   100 mg  at 11/22/21 0956   traZODone (DESYREL) tablet 100 mg  100 mg Oral QHS PRN Suzanna Zahn, Madie Reno, MD   100 mg at 11/21/21 2201   vitamin B-12 (CYANOCOBALAMIN) tablet 1,000 mcg  1,000 mcg Oral Daily Suella Broad, FNP   1,000 mcg at 11/22/21 5361   warfarin (COUMADIN) tablet 2 mg  2 mg Oral QPM Suella Broad, FNP   2 mg at 11/21/21 1811   Warfarin - Physician Dosing Inpatient   Does not apply q1600 RauerForde Dandy, [facility]   Given at 11/21/21 1656    Lab Results:  Results for orders placed or performed during the hospital encounter of 11/16/21 (from the past 48 hour(s))  Protime-INR     Status: Abnormal   Collection Time: 11/22/21  7:01 AM  Result Value Ref Range   Prothrombin Time 22.5 (H) 11.4 - 15.2 seconds   INR 2.0 (H) 0.8 - 1.2    Comment: (NOTE) INR goal varies based on device and disease states. Performed at [facility], [address] [place], [place] [address]     Blood Alcohol level:  Lab Results  Component Value Date   [facility] <10 40/06/6760    Metabolic Disorder Labs: Lab Results  Component Value Date   HGBA1C 5.2 11/17/2021   MPG 102.54 11/17/2021   MPG 105.41 08/05/2018   No results found for: PROLACTIN Lab Results  Component Value Date   CHOL 117 11/17/2021   TRIG 96 11/17/2021   HDL 28 (L) 11/17/2021   CHOLHDL 4.2 11/17/2021   VLDL 19 11/17/2021   LDLCALC 70 11/17/2021   LDLCALC 87 08/22/2019    Physical Findings: AIMS:  , ,  ,  ,    CIWA:    COWS:     Musculoskeletal: Strength & Muscle Tone: within normal limits Gait & Station: normal Patient leans: N/A  Psychiatric Specialty Exam:  Presentation  General Appearance: No data recorded Eye Contact:No data recorded Speech:No data  recorded Speech Volume:No data recorded Handedness:No data recorded  Mood and Affect  Mood:No data recorded Affect:No data recorded  Thought Process  Thought Processes:No data recorded Descriptions of Associations:No data recorded Orientation:No data recorded Thought Content:No data recorded History of Schizophrenia/Schizoaffective disorder:No  Duration of Psychotic Symptoms:No data recorded Hallucinations:No data recorded Ideas of Reference:No data recorded Suicidal Thoughts:No data recorded Homicidal Thoughts:No data recorded  Sensorium  Memory:No data recorded Judgment:No data recorded Insight:No data recorded  Executive Functions  Concentration:No data recorded Attention Span:No data recorded Recall:No data recorded Fund of Knowledge:No data recorded Language:No data recorded  Psychomotor Activity  Psychomotor Activity:No data recorded  Assets  Assets:No data recorded  Sleep  Sleep:No data recorded   Physical Exam: Physical Exam Vitals and nursing note reviewed.  Constitutional:      Appearance: Normal appearance.  HENT:     Head: Normocephalic and atraumatic.     Mouth/Throat:     Pharynx: Oropharynx is clear.  Eyes:     Pupils: Pupils are equal, round, and reactive to light.  Cardiovascular:     Rate and Rhythm: Normal rate and regular rhythm.  Pulmonary:     Effort: Pulmonary effort is normal.     Breath sounds: Normal breath sounds.  Abdominal:     General: Abdomen is flat.     Palpations: Abdomen is soft.  Musculoskeletal:        General: Normal range of motion.  Skin:    General: Skin is warm and dry.  Neurological:  General: No focal deficit present.     Mental Status: He is alert. Mental status is at baseline.  Psychiatric:        Attention and Perception: Attention normal.        Mood and Affect: Mood normal.        Speech: Speech normal.        Behavior: Behavior is cooperative.        Thought Content: Thought content normal.         Cognition and Memory: Cognition normal.   Review of Systems  Constitutional:  Positive for malaise/fatigue.  HENT: Negative.    Eyes: Negative.   Respiratory: Negative.    Cardiovascular: Negative.   Gastrointestinal: Negative.   Musculoskeletal: Negative.   Skin: Negative.   Neurological: Negative.   Psychiatric/Behavioral: Negative.    Blood pressure 121/78, pulse 79, temperature (!) 97.5 F (36.4 C), temperature source Oral, resp. rate 18, height 5\' 10"  (1.778 m), weight 82.6 kg, SpO2 98 %. Body mass index is 26.11 kg/m.   Treatment Plan Summary: Medication management and Plan patient with improvement in depression.  Fatigue during the day.  Could be multiple things.  Could just be the lack of activity on the unit.  Reviewed medicine.  Several things could possibly cause some fatigue.  I am going to try changing his Abilify to nighttime and see if that has a positive effect.  No other change to medicine.  Supportive counseling and therapy done.  Alethia Berthold, MD 11/22/2021, 3:18 PM

## 2021-11-23 DIAGNOSIS — F332 Major depressive disorder, recurrent severe without psychotic features: Secondary | ICD-10-CM | POA: Diagnosis not present

## 2021-11-23 MED ORDER — CLONAZEPAM 0.5 MG PO TABS
1.0000 mg | ORAL_TABLET | Freq: Every day | ORAL | Status: DC
  Administered 2021-11-23 – 2021-11-25 (×3): 1 mg via ORAL
  Filled 2021-11-23 (×3): qty 2

## 2021-11-23 NOTE — Progress Notes (Signed)
Patient walked up to nurses station and c/o trouble;e sleeping. Pt was already given trazodone 100 mg PRN as per MAR orders. Medication was not effective. MD on call was contacted. I left a voice message and received no response. Pt returned to his room to lay down. Pt remains safe on the unit at his time.

## 2021-11-23 NOTE — Group Note (Signed)
[facility] LCSW Group Therapy Note   Group Date: 11/23/2021 Start Time: 1430 End Time: 1530  Type of Therapy/Topic:  Group Therapy:  Feelings about Diagnosis  Participation Level:  Active      Description of Group:    This group will allow patients to explore their thoughts and feelings about diagnoses they have received. Patients will be guided to explore their level of understanding and acceptance of these diagnoses. Facilitator will encourage patients to process their thoughts and feelings about the reactions of others to their diagnosis, and will guide patients in identifying ways to discuss their diagnosis with significant others in their lives. This group will be process-oriented, with patients participating in exploration of their own experiences as well as giving and receiving support and challenge from other group members.   Therapeutic Goals: 1. Patient will demonstrate understanding of diagnosis as evidence by identifying two or more symptoms of the disorder:  2. Patient will be able to express two feelings regarding the diagnosis 3. Patient will demonstrate ability to communicate their needs through discussion and/or role plays  Summary of Patient Progress:   Patient was present for the individual session. Patient was an active listener and participated in the topic of discussion, had a somewhat low mood. He stated he has still been unable to sleep and feels lethargic during the day. He stated "what's wrong with me" referring to being unable to "shake off" his depressed mood. He said he felt preoccupied with thoughts of his burial preparation and that it made him feel scared. CSW discussed the options of follow up regarding support groups or individual therapy and he stated he would consider therapy though groups are not for him.     Therapeutic Modalities:   Cognitive Behavioral Therapy Brief Therapy Feelings Identification    Juanita Devincent A [place], LCSWA

## 2021-11-23 NOTE — BH IP Treatment Plan (Signed)
Interdisciplinary Treatment and Diagnostic Plan Update  11/23/2021 Time of Session: 10:00AM [name] MRN: [id number]  Principal Diagnosis: MDD (major depressive disorder), recurrent episode, severe ([place])  Secondary Diagnoses: Principal Problem:   MDD (major depressive disorder), recurrent episode, severe ([place]) Active Problems:   Major depressive disorder, recurrent episode, severe ([place])   Current Medications:  Current Facility-Administered Medications  Medication Dose Route Frequency Provider Last Rate Last Admin   acetaminophen (TYLENOL) tablet 650 mg  650 mg Oral Q6H PRN Suella Broad, FNP   650 mg at 11/21/21 2202   allopurinol (ZYLOPRIM) tablet 100 mg  100 mg Oral Daily Suella Broad, FNP   100 mg at 11/23/21 1014   alum & mag hydroxide-simeth (MAALOX/MYLANTA) 200-200-20 MG/5ML suspension 30 mL  30 mL Oral Q4H PRN Starkes-Perry, Gayland Curry, FNP       amiodarone (PACERONE) tablet 200 mg  200 mg Oral Daily Suella Broad, FNP   200 mg at 11/23/21 1014   ARIPiprazole (ABILIFY) tablet 5 mg  5 mg Oral QHS Clapacs, John T, MD       atorvastatin (LIPITOR) tablet 20 mg  20 mg Oral QHS Suella Broad, FNP   20 mg at 11/22/21 2158   clonazePAM (KLONOPIN) tablet 1 mg  1 mg Oral QHS Parks Ranger, DO   1 mg at 11/22/21 2159   dapagliflozin propanediol (FARXIGA) tablet 10 mg  10 mg Oral Daily Parks Ranger, DO   10 mg at 11/23/21 1013   ferrous sulfate tablet 325 mg  325 mg Oral Q breakfast Suella Broad, FNP   325 mg at 11/23/21 4128   magnesium hydroxide (MILK OF MAGNESIA) suspension 30 mL  30 mL Oral Daily PRN Suella Broad, FNP       magnesium oxide (MAG-OX) tablet 400 mg  400 mg Oral BID Suella Broad, FNP   400 mg at 11/23/21 1014   mirtazapine (REMERON) tablet 15 mg  15 mg Oral QHS Parks Ranger, DO   15 mg at 11/22/21 2158   omega-3 acid ethyl esters (LOVAZA) capsule 1 g  1 g Oral BID  Suella Broad, FNP   1 g at 11/23/21 1014   ondansetron (ZOFRAN-ODT) disintegrating tablet 4 mg  4 mg Oral Q8H PRN Caroline Sauger, NP   4 mg at 11/17/21 1324   potassium chloride SA (KLOR-CON M) CR tablet 40 mEq  40 mEq Oral TID Suella Broad, FNP   40 mEq at 11/23/21 1014   tacrolimus (PROGRAF) capsule 0.5 mg  0.5 mg Oral [name], FNP   0.5 mg at 11/23/21 1014   tacrolimus (PROGRAF) capsule 1 mg  1 mg Oral [name], FNP   1 mg at 11/22/21 0955   torsemide (DEMADEX) tablet 100 mg  100 mg Oral Daily Suella Broad, FNP   100 mg at 11/23/21 1013   traZODone (DESYREL) tablet 100 mg  100 mg Oral QHS PRN Clapacs, Madie Reno, MD   100 mg at 11/22/21 2159   vitamin B-12 (CYANOCOBALAMIN) tablet 1,000 mcg  1,000 mcg Oral Daily Suella Broad, FNP   1,000 mcg at 11/23/21 1014   warfarin (COUMADIN) tablet 2 mg  2 mg Oral QPM Suella Broad, FNP   2 mg at 11/22/21 1821   Warfarin - Physician Dosing Inpatient   Does not apply q1600 Sherilyn Banker, [facility]   Given at 11/22/21 1659   PTA Medications: Medications Prior  to Admission  Medication Sig Dispense Refill Last Dose   allopurinol (ZYLOPRIM) 100 MG tablet Take 100 mg by mouth daily.      amiodarone (PACERONE) 200 MG tablet Take 1 tablet (200 mg total) by mouth daily. 30 tablet 5    atorvastatin (LIPITOR) 40 MG tablet Take 1 tablet (40 mg total) by mouth at bedtime. (Patient taking differently: Take 20 mg by mouth at bedtime.) 90 tablet 3    calcitRIOL (ROCALTROL) 0.25 MCG capsule Take 0.25 mcg by mouth daily.      Cholecalciferol (VITAMIN D3) 50 MCG (2000 UT) TABS Take 2,000 Units by mouth daily.      Cyanocobalamin (VITAMIN B12) 1000 MCG TBCR Take 1,000 mcg by mouth daily.      dapagliflozin propanediol (FARXIGA) 10 MG TABS tablet Take 1 tablet (10 mg total) by mouth daily. 30 tablet 6    DULoxetine (CYMBALTA) 60 MG capsule Take 60 mg by mouth at bedtime.       EPINEPHrine (EPI-PEN) 0.3 mg/0.3 mL DEVI Inject 0.3 mg into the muscle once as needed (severe allergic reaction). (Patient not taking: Reported on 11/15/2021)      ferrous sulfate 325 (65 FE) MG tablet Take 325 mg by mouth daily with breakfast.      fish oil-omega-3 fatty acids 1000 MG capsule Take 1 g by mouth 2 (two) times daily.      MAGNESIUM-OXIDE 400 (240 Mg) MG tablet Take 400 mg by mouth 2 (two) times daily.      melatonin 5 MG TABS Take 5 mg by mouth at bedtime.      nitroGLYCERIN (NITROSTAT) 0.4 MG SL tablet Place 0.4 mg under the tongue every 5 (five) minutes as needed for chest pain. (Patient not taking: Reported on 11/15/2021)      potassium chloride SA (KLOR-CON) 20 MEQ tablet Take 2 tablets (40 mEq total) by mouth 3 (three) times daily. 90 tablet 6    predniSONE (DELTASONE) 5 MG tablet Take 1 tablet (5 mg total) by mouth daily.      QUEtiapine (SEROQUEL) 50 MG tablet Take 200 mg by mouth at bedtime.      tacrolimus (PROGRAF) 0.5 MG capsule Take 0.5 mg by mouth See admin instructions. Takes 0.5 mg  tablet by mouth one day and 1 mg tablets next day (1 and 2 tablets on alternate days)      torsemide (DEMADEX) 20 MG tablet Take 5 tablets (100 mg total) by mouth daily. 210 tablet 6    warfarin (COUMADIN) 4 MG tablet TAKE AS DIRECTED BY COUMADIN CLINIC (Patient taking differently: Take 2 mg by mouth every evening.) 65 tablet 1    zolpidem (AMBIEN) 5 MG tablet Take 5 mg by mouth at bedtime as needed for sleep.       Patient Stressors: Financial difficulties   Health problems   Medication change or noncompliance    Patient Strengths: Capable of independent living  Communication skills  Supportive family/friends   Treatment Modalities: Medication Management, Group therapy, Case management,  1 to 1 session with clinician, Psychoeducation, Recreational therapy.   Physician Treatment Plan for Primary Diagnosis: MDD (major depressive disorder), recurrent episode, severe ([place]) Long Term  Goal(s): Improvement in symptoms so as ready for discharge   Short Term Goals: Ability to identify changes in lifestyle to reduce recurrence of condition will improve Ability to verbalize feelings will improve Ability to disclose and discuss suicidal ideas Ability to demonstrate self-control will improve Ability to identify and develop effective coping behaviors  will improve Ability to maintain clinical measurements within normal limits will improve Compliance with prescribed medications will improve Ability to identify triggers associated with substance abuse/mental health issues will improve  Medication Management: Evaluate patient's response, side effects, and tolerance of medication regimen.  Therapeutic Interventions: 1 to 1 sessions, Unit Group sessions and Medication administration.  Evaluation of Outcomes: Progressing  Physician Treatment Plan for Secondary Diagnosis: Principal Problem:   MDD (major depressive disorder), recurrent episode, severe ([place]) Active Problems:   Major depressive disorder, recurrent episode, severe ([place])  Long Term Goal(s): Improvement in symptoms so as ready for discharge   Short Term Goals: Ability to identify changes in lifestyle to reduce recurrence of condition will improve Ability to verbalize feelings will improve Ability to disclose and discuss suicidal ideas Ability to demonstrate self-control will improve Ability to identify and develop effective coping behaviors will improve Ability to maintain clinical measurements within normal limits will improve Compliance with prescribed medications will improve Ability to identify triggers associated with substance abuse/mental health issues will improve     Medication Management: Evaluate patient's response, side effects, and tolerance of medication regimen.  Therapeutic Interventions: 1 to 1 sessions, Unit Group sessions and Medication administration.  Evaluation of Outcomes: Progressing   RN  Treatment Plan for Primary Diagnosis: MDD (major depressive disorder), recurrent episode, severe ([place]) Long Term Goal(s): Knowledge of disease and therapeutic regimen to maintain health will improve  Short Term Goals: Ability to remain free from injury will improve, Ability to demonstrate self-control, Ability to participate in decision making will improve, Ability to verbalize feelings will improve, Ability to disclose and discuss suicidal ideas, Ability to identify and develop effective coping behaviors will improve, and Compliance with prescribed medications will improve  Medication Management: RN will administer medications as ordered by provider, will assess and evaluate patient's response and provide education to patient for prescribed medication. RN will report any adverse and/or side effects to prescribing provider.  Therapeutic Interventions: 1 on 1 counseling sessions, Psychoeducation, Medication administration, Evaluate responses to treatment, Monitor vital signs and CBGs as ordered, Perform/monitor CIWA, COWS, AIMS and Fall Risk screenings as ordered, Perform wound care treatments as ordered.  Evaluation of Outcomes: Progressing   LCSW Treatment Plan for Primary Diagnosis: MDD (major depressive disorder), recurrent episode, severe ([place]) Long Term Goal(s): Safe transition to appropriate next level of care at discharge, Engage patient in therapeutic group addressing interpersonal concerns.  Short Term Goals: Engage patient in aftercare planning with referrals and resources, Increase social support, Increase ability to appropriately verbalize feelings, Increase emotional regulation, Facilitate acceptance of mental health diagnosis and concerns, Identify triggers associated with mental health/substance abuse issues, and Increase skills for wellness and recovery  Therapeutic Interventions: Assess for all discharge needs, 1 to 1 time with Social worker, Explore available resources and support  systems, Assess for adequacy in community support network, Educate family and significant other(s) on suicide prevention, Complete Psychosocial Assessment, Interpersonal group therapy.  Evaluation of Outcomes: Progressing   Progress in Treatment: Attending groups: Yes. Participating in groups: Yes. Taking medication as prescribed: Yes. Toleration medication: Yes. Family/Significant other contact made: No, will contact:  pt declined permission Patient understands diagnosis: Yes. Discussing patient identified problems/goals with staff: Yes. Medical problems stabilized or resolved: Yes. Denies suicidal/homicidal ideation: Yes. Issues/concerns per patient self-inventory: No. Other: None  New problem(s) identified: No, Describe:  none  New Short Term/Long Term Goal(s): Patient to work towards  medication management for mood stabilization; elimination of SI thoughts; development of comprehensive mental  wellness plan.  Update 11/23/21: No changes at this time.      Patient Goals:  "to get my mind clear and to get more sleep" Update 11/23/21: No changes at this time.    Discharge Plan or Barriers: CSW will assist pt in development of appropriate discharge/aftercare plan.  Update 11/23/21: No changes at this time.    Reason for Continuation of Hospitalization: Anxiety Depression Medication stabilization   Estimated Length of Stay: TBD Scribe for Treatment Team: Hermelinda Diegel A [place], Latanya Presser 11/23/2021 11:21 AM

## 2021-11-23 NOTE — Progress Notes (Signed)
Patient is alert and oriented times 4. Mood and affect sullen/flat. Patient lays in bed all day- gets up for meals. Patient denies pain at this time. He denies SI, HI, and AVH. Also denies feelings of anxiety and depression at this time. States he didn't get any sleep last night. Morning meds given whole by mouth W/O difficulty. Ate breakfast in day room- appetite good. Patient remains on unit with Q15 minute checks in place.

## 2021-11-23 NOTE — Progress Notes (Signed)
Recreation Therapy Notes   Date: 11/23/2021  Time: 1:20 pm    Location: Quiet Room    Behavioral response: Appropriate  Intervention Topic:  Goals      Discussion/Intervention:  Group content on today was focused on goals. Patients described what goals are and how they define goals. Individuals expressed how they go about setting goals and reaching them. The group identified how important goals are and if they make short term goals to reach long term goals. Patients described how many goals they work on at a time and what affects them not reaching their goal. Individuals described how much time they put into planning and obtaining their goals. The group participated in the intervention My Goal Board and made personal goal boards to help them achieve their goal. Clinical Observations/Feedback: Patient came to group and defined  goals as plan for the future. He stated that his goals are to get back out in life and work on his social skills. Individual was social with peers and staff while participating in the intervention.    Sayvion Vigen LRT/CTRS         Sheronda Parran 11/23/2021 2:38 PM

## 2021-11-23 NOTE — Progress Notes (Signed)
[facility] MD Progress Note  11/23/2021 12:22 PM [name]  MRN:  [id number] Subjective: [name] continues to underestimate his sleep.  He states that he falls asleep late and sometimes sleeps in the daytime.  The nurses tell me that he is sleeping but does have trouble falling asleep.  We discussed moving up the time that he takes his Klonopin because he does not usually get up till about 10:00 at night.  He continues on Remeron and his Abilify was changed to bedtime.  He states that his mood seems to be getting a little bit better.  Principal Problem: MDD (major depressive disorder), recurrent episode, severe ([place]) Diagnosis: Principal Problem:   MDD (major depressive disorder), recurrent episode, severe ([place]) Active Problems:   Major depressive disorder, recurrent episode, severe ([place])  Total Time spent with patient: 15 minutes  Past Psychiatric History: None  Past Medical History:  Past Medical History:  Diagnosis Date   Atrial fibrillation ([place]) 08/2009   CAD (coronary artery disease)    s/p CABG -- 1992   Depression    ESRD (end stage renal disease) ([place])    HLD (hyperlipidemia)    HTN (hypertension)    Idiopathic thrombocytopenia (HCC)     Past Surgical History:  Procedure Laterality Date   APPENDECTOMY     CORONARY ARTERY BYPASS GRAFT     FACIAL COSMETIC SURGERY     GALLBLADDER SURGERY     HERNIA REPAIR     KIDNEY TRANSPLANT     LEFT HEART CATH AND CORONARY ANGIOGRAPHY N/A 08/07/2018   Procedure: LEFT HEART CATH AND CORONARY ANGIOGRAPHY;  Surgeon: Sherren Mocha, MD;  Location: [place] [facility];  Service: Cardiovascular;  Laterality: N/A;   LIVER TRANSPLANT     PRESSURE SENSOR/CARDIOMEMS N/A 08/04/2021   Procedure: PRESSURE SENSOR/CARDIOMEMS;  Surgeon: Larey Dresser, MD;  Location: [place] [facility];  Service: Cardiovascular;  Laterality: N/A;   RIGHT HEART CATH N/A 06/25/2021   Procedure: RIGHT HEART CATH;  Surgeon: Larey Dresser, MD;  Location: [place]  [facility];  Service: Cardiovascular;  Laterality: N/A;   Family History:  Family History  Problem Relation Age of Onset   Atrial fibrillation Mother    Breast cancer Mother    Renal cancer Father    Hypertension Brother    Hyperlipidemia Brother    Other Brother        stent    Renal Disease Maternal Grandmother    Stroke Maternal Grandmother    Heart attack Maternal Grandfather    Cancer Paternal Grandmother    Heart failure Paternal Grandfather    Emphysema Paternal Grandfather    Bronchiolitis Paternal Grandfather     Social History:  Social History   Substance and Sexual Activity  Alcohol Use No     Social History   Substance and Sexual Activity  Drug Use No    Social History   Socioeconomic History   Marital status: Single    Spouse name: Not on file   Number of children: 0   Years of education: Not on file   Highest education level: Not on file  Occupational History   Occupation: retired  Tobacco Use   Smoking status: Never   Smokeless tobacco: Never  Vaping Use   Vaping Use: Never used  Substance and Sexual Activity   Alcohol use: No   Drug use: No   Sexual activity: Not on file  Other Topics Concern   Not on file  Social History Narrative  Not on file   Social Determinants of Health   Financial Resource Strain: Low Risk    Difficulty of Paying Living Expenses: Not very hard  Food Insecurity: No Food Insecurity   Worried About Running Out of Food in the Last Year: Never true   Ran Out of Food in the Last Year: Never true  Transportation Needs: No Transportation Needs   Lack of Transportation (Medical): No   Lack of Transportation (Non-Medical): No  Physical Activity: Not on file  Stress: Not on file  Social Connections: Not on file   Additional Social History:                         Sleep: Fair  Appetite:  Good  Current Medications: Current Facility-Administered Medications  Medication Dose Route Frequency Provider Last  Rate Last Admin   acetaminophen (TYLENOL) tablet 650 mg  650 mg Oral Q6H PRN Suella Broad, FNP   650 mg at 11/21/21 2202   allopurinol (ZYLOPRIM) tablet 100 mg  100 mg Oral Daily Suella Broad, FNP   100 mg at 11/23/21 1014   alum & mag hydroxide-simeth (MAALOX/MYLANTA) 200-200-20 MG/5ML suspension 30 mL  30 mL Oral Q4H PRN Starkes-Perry, Gayland Curry, FNP       amiodarone (PACERONE) tablet 200 mg  200 mg Oral Daily Suella Broad, FNP   200 mg at 11/23/21 1014   ARIPiprazole (ABILIFY) tablet 5 mg  5 mg Oral QHS Clapacs, John T, MD       atorvastatin (LIPITOR) tablet 20 mg  20 mg Oral QHS Suella Broad, FNP   20 mg at 11/22/21 2158   clonazePAM (KLONOPIN) tablet 1 mg  1 mg Oral QHS Parks Ranger, DO   1 mg at 11/22/21 2159   dapagliflozin propanediol (FARXIGA) tablet 10 mg  10 mg Oral Daily Parks Ranger, DO   10 mg at 11/23/21 1013   ferrous sulfate tablet 325 mg  325 mg Oral Q breakfast Suella Broad, FNP   325 mg at 11/23/21 1610   magnesium hydroxide (MILK OF MAGNESIA) suspension 30 mL  30 mL Oral Daily PRN Suella Broad, FNP       magnesium oxide (MAG-OX) tablet 400 mg  400 mg Oral BID Suella Broad, FNP   400 mg at 11/23/21 1014   mirtazapine (REMERON) tablet 15 mg  15 mg Oral QHS Parks Ranger, DO   15 mg at 11/22/21 2158   omega-3 acid ethyl esters (LOVAZA) capsule 1 g  1 g Oral BID Suella Broad, FNP   1 g at 11/23/21 1014   ondansetron (ZOFRAN-ODT) disintegrating tablet 4 mg  4 mg Oral Q8H PRN Caroline Sauger, NP   4 mg at 11/17/21 1324   potassium chloride SA (KLOR-CON M) CR tablet 40 mEq  40 mEq Oral TID Suella Broad, FNP   40 mEq at 11/23/21 1014   tacrolimus (PROGRAF) capsule 0.5 mg  0.5 mg Oral [name], FNP   0.5 mg at 11/23/21 1014   tacrolimus (PROGRAF) capsule 1 mg  1 mg Oral [name], FNP   1 mg at 11/22/21 0955   torsemide  (DEMADEX) tablet 100 mg  100 mg Oral Daily Suella Broad, FNP   100 mg at 11/23/21 1013   traZODone (DESYREL) tablet 100 mg  100 mg Oral QHS PRN Clapacs, Madie Reno, MD   100 mg at 11/22/21 2159  vitamin B-12 (CYANOCOBALAMIN) tablet 1,000 mcg  1,000 mcg Oral Daily Suella Broad, FNP   1,000 mcg at 11/23/21 1014   warfarin (COUMADIN) tablet 2 mg  2 mg Oral QPM Suella Broad, FNP   2 mg at 11/22/21 1821   Warfarin - Physician Dosing Inpatient   Does not apply q1600 RauerForde Dandy, [facility]   Given at 11/22/21 1659    Lab Results:  Results for orders placed or performed during the hospital encounter of 11/16/21 (from the past 48 hour(s))  Protime-INR     Status: Abnormal   Collection Time: 11/22/21  7:01 AM  Result Value Ref Range   Prothrombin Time 22.5 (H) 11.4 - 15.2 seconds   INR 2.0 (H) 0.8 - 1.2    Comment: (NOTE) INR goal varies based on device and disease states. Performed at [facility], [address] [place], [place] [address]     Blood Alcohol level:  Lab Results  Component Value Date   [facility] <10 74/25/9563    Metabolic Disorder Labs: Lab Results  Component Value Date   HGBA1C 5.2 11/17/2021   MPG 102.54 11/17/2021   MPG 105.41 08/05/2018   No results found for: PROLACTIN Lab Results  Component Value Date   CHOL 117 11/17/2021   TRIG 96 11/17/2021   HDL 28 (L) 11/17/2021   CHOLHDL 4.2 11/17/2021   VLDL 19 11/17/2021   LDLCALC 70 11/17/2021   LDLCALC 87 08/22/2019    Physical Findings: AIMS:  , ,  ,  ,    CIWA:    COWS:     Musculoskeletal: Strength & Muscle Tone: within normal limits Gait & Station: normal Patient leans: N/A  Psychiatric Specialty Exam:  Presentation  General Appearance: No data recorded Eye Contact:No data recorded Speech:No data recorded Speech Volume:No data recorded Handedness:No data recorded  Mood and Affect  Mood:No data recorded Affect:No data recorded  Thought Process  Thought  Processes:No data recorded Descriptions of Associations:No data recorded Orientation:No data recorded Thought Content:No data recorded History of Schizophrenia/Schizoaffective disorder:No  Duration of Psychotic Symptoms:No data recorded Hallucinations:No data recorded Ideas of Reference:No data recorded Suicidal Thoughts:No data recorded Homicidal Thoughts:No data recorded  Sensorium  Memory:No data recorded Judgment:No data recorded Insight:No data recorded  Executive Functions  Concentration:No data recorded Attention Span:No data recorded Recall:No data recorded Fund of Knowledge:No data recorded Language:No data recorded  Psychomotor Activity  Psychomotor Activity:No data recorded  Assets  Assets:No data recorded  Sleep  Sleep:No data recorded   Physical Exam: Physical Exam Vitals and nursing note reviewed.  Constitutional:      Appearance: Normal appearance. He is normal weight.  Neurological:     General: No focal deficit present.     Mental Status: He is alert and oriented to person, place, and time.  Psychiatric:        Attention and Perception: Attention and perception normal.        Mood and Affect: Mood is anxious and depressed.        Speech: Speech normal.        Behavior: Behavior normal. Behavior is cooperative.        Thought Content: Thought content normal.        Cognition and Memory: Cognition and memory normal.        Judgment: Judgment normal.   Review of Systems  Constitutional: Negative.   HENT: Negative.    Eyes: Negative.   Respiratory: Negative.    Cardiovascular: Negative.  Gastrointestinal: Negative.   Genitourinary: Negative.   Musculoskeletal: Negative.   Skin: Negative.   Neurological: Negative.   Endo/Heme/Allergies: Negative.   Psychiatric/Behavioral:  Positive for depression.   Blood pressure 116/75, pulse 69, temperature 97.8 F (36.6 C), temperature source Oral, resp. rate 20, height 5\' 10"  (1.778 m), weight 82.6  kg, SpO2 97 %. Body mass index is 26.11 kg/m.   Treatment Plan Summary: Daily contact with patient to assess and evaluate symptoms and progress in treatment, Medication management, and Plan continue current medications.  Change Klonopin from 10:00 to 8:00.  Parks Ranger, DO 11/23/2021, 12:22 PM

## 2021-11-24 DIAGNOSIS — F332 Major depressive disorder, recurrent severe without psychotic features: Secondary | ICD-10-CM | POA: Diagnosis not present

## 2021-11-24 NOTE — Progress Notes (Signed)
[facility] MD Progress Note  11/24/2021 11:51 AM Djon Tith  MRN:  [id number] Subjective: [name] is sleeping better.  He still underestimates his sleep.  He states that his mood is better.  He is taking his medications as prescribed and denies any side effects.  He denies any suicidal ideation.  He states that he would be ready to go sometime this week.  We are to [place] up with [facility] in [place] which is not too far from his house.  He does have transportation.  Principal Problem: MDD (major depressive disorder), recurrent episode, severe ([place]) Diagnosis: Principal Problem:   MDD (major depressive disorder), recurrent episode, severe ([place]) Active Problems:   Major depressive disorder, recurrent episode, severe ([place])  Total Time spent with patient: 15 minutes  Past Psychiatric History: None  Past Medical History:  Past Medical History:  Diagnosis Date   Atrial fibrillation ([place]) 08/2009   CAD (coronary artery disease)    s/p CABG -- 1992   Depression    ESRD (end stage renal disease) ([place])    HLD (hyperlipidemia)    HTN (hypertension)    Idiopathic thrombocytopenia (HCC)     Past Surgical History:  Procedure Laterality Date   APPENDECTOMY     CORONARY ARTERY BYPASS GRAFT     FACIAL COSMETIC SURGERY     GALLBLADDER SURGERY     HERNIA REPAIR     KIDNEY TRANSPLANT     LEFT HEART CATH AND CORONARY ANGIOGRAPHY N/A 08/07/2018   Procedure: LEFT HEART CATH AND CORONARY ANGIOGRAPHY;  Surgeon: Sherren Mocha, MD;  Location: [place] [facility];  Service: Cardiovascular;  Laterality: N/A;   LIVER TRANSPLANT     PRESSURE SENSOR/CARDIOMEMS N/A 08/04/2021   Procedure: PRESSURE SENSOR/CARDIOMEMS;  Surgeon: Larey Dresser, MD;  Location: [place] [facility];  Service: Cardiovascular;  Laterality: N/A;   RIGHT HEART CATH N/A 06/25/2021   Procedure: RIGHT HEART CATH;  Surgeon: Larey Dresser, MD;  Location: [place] [facility];  Service: Cardiovascular;  Laterality: N/A;   Family History:   Family History  Problem Relation Age of Onset   Atrial fibrillation Mother    Breast cancer Mother    Renal cancer Father    Hypertension Brother    Hyperlipidemia Brother    Other Brother        stent    Renal Disease Maternal Grandmother    Stroke Maternal Grandmother    Heart attack Maternal Grandfather    Cancer Paternal Grandmother    Heart failure Paternal Grandfather    Emphysema Paternal Grandfather    Bronchiolitis Paternal Grandfather     Social History:  Social History   Substance and Sexual Activity  Alcohol Use No     Social History   Substance and Sexual Activity  Drug Use No    Social History   Socioeconomic History   Marital status: Single    Spouse name: Not on file   Number of children: 0   Years of education: Not on file   Highest education level: Not on file  Occupational History   Occupation: retired  Tobacco Use   Smoking status: Never   Smokeless tobacco: Never  Vaping Use   Vaping Use: Never used  Substance and Sexual Activity   Alcohol use: No   Drug use: No   Sexual activity: Not on file  Other Topics Concern   Not on file  Social History Narrative   Not on file   Social Determinants of Health  Financial Resource Strain: Low Risk    Difficulty of Paying Living Expenses: Not very hard  Food Insecurity: No Food Insecurity   Worried About Charity fundraiser in the Last Year: Never true   Ran Out of Food in the Last Year: Never true  Transportation Needs: No Transportation Needs   Lack of Transportation (Medical): No   Lack of Transportation (Non-Medical): No  Physical Activity: Not on file  Stress: Not on file  Social Connections: Not on file   Additional Social History:                         Sleep: Good  Appetite:  Good  Current Medications: Current Facility-Administered Medications  Medication Dose Route Frequency Provider Last Rate Last Admin   acetaminophen (TYLENOL) tablet 650 mg  650 mg Oral Q6H  PRN Suella Broad, FNP   650 mg at 11/21/21 2202   allopurinol (ZYLOPRIM) tablet 100 mg  100 mg Oral Daily Suella Broad, FNP   100 mg at 11/24/21 0900   alum & mag hydroxide-simeth (MAALOX/MYLANTA) 200-200-20 MG/5ML suspension 30 mL  30 mL Oral Q4H PRN Starkes-Perry, Gayland Curry, FNP       amiodarone (PACERONE) tablet 200 mg  200 mg Oral Daily Starkes-Perry, Takia S, FNP   200 mg at 11/24/21 0900   ARIPiprazole (ABILIFY) tablet 5 mg  5 mg Oral QHS Clapacs, John T, MD   5 mg at 11/23/21 2112   atorvastatin (LIPITOR) tablet 20 mg  20 mg Oral QHS Suella Broad, FNP   20 mg at 11/23/21 2111   clonazePAM (KLONOPIN) tablet 1 mg  1 mg Oral Q2000 Parks Ranger, DO   1 mg at 11/23/21 2112   dapagliflozin propanediol (FARXIGA) tablet 10 mg  10 mg Oral Daily Parks Ranger, DO   10 mg at 11/24/21 0901   ferrous sulfate tablet 325 mg  325 mg Oral Q breakfast Suella Broad, FNP   325 mg at 11/24/21 0900   magnesium hydroxide (MILK OF MAGNESIA) suspension 30 mL  30 mL Oral Daily PRN Suella Broad, FNP       magnesium oxide (MAG-OX) tablet 400 mg  400 mg Oral BID Suella Broad, FNP   400 mg at 11/24/21 0900   mirtazapine (REMERON) tablet 15 mg  15 mg Oral QHS Parks Ranger, DO   15 mg at 11/23/21 2111   omega-3 acid ethyl esters (LOVAZA) capsule 1 g  1 g Oral BID Suella Broad, FNP   1 g at 11/24/21 0901   ondansetron (ZOFRAN-ODT) disintegrating tablet 4 mg  4 mg Oral Q8H PRN Caroline Sauger, NP   4 mg at 11/17/21 1324   potassium chloride SA (KLOR-CON M) CR tablet 40 mEq  40 mEq Oral TID Suella Broad, FNP   40 mEq at 11/24/21 0900   tacrolimus (PROGRAF) capsule 0.5 mg  0.5 mg Oral [name], FNP   0.5 mg at 11/23/21 1014   tacrolimus (PROGRAF) capsule 1 mg  1 mg Oral [name], FNP   1 mg at 11/24/21 0901   torsemide (DEMADEX) tablet 100 mg  100 mg Oral Daily Suella Broad, FNP   100 mg at 11/24/21 0901   traZODone (DESYREL) tablet 100 mg  100 mg Oral QHS PRN Clapacs, Madie Reno, MD   100 mg at 11/22/21 2159   vitamin B-12 (CYANOCOBALAMIN) tablet 1,000 mcg  1,000 mcg Oral Daily Suella Broad, FNP   1,000 mcg at 11/24/21 0900   warfarin (COUMADIN) tablet 2 mg  2 mg Oral QPM Suella Broad, FNP   2 mg at 11/23/21 1829   Warfarin - Physician Dosing Inpatient   Does not apply q1600 RauerForde Dandy, [facility]   2 each at 11/23/21 1557    Lab Results: No results found for this or any previous visit (from the past 48 hour(s)).  Blood Alcohol level:  Lab Results  Component Value Date   ETH <10 25/85/2778    Metabolic Disorder Labs: Lab Results  Component Value Date   HGBA1C 5.2 11/17/2021   MPG 102.54 11/17/2021   MPG 105.41 08/05/2018   No results found for: PROLACTIN Lab Results  Component Value Date   CHOL 117 11/17/2021   TRIG 96 11/17/2021   HDL 28 (L) 11/17/2021   CHOLHDL 4.2 11/17/2021   VLDL 19 11/17/2021   LDLCALC 70 11/17/2021   LDLCALC 87 08/22/2019    Physical Findings: AIMS:  , ,  ,  ,    CIWA:    COWS:     Musculoskeletal: Strength & Muscle Tone: within normal limits Gait & Station: normal Patient leans: N/A  Psychiatric Specialty Exam:  Presentation  General Appearance: No data recorded Eye Contact:No data recorded Speech:No data recorded Speech Volume:No data recorded Handedness:No data recorded  Mood and Affect  Mood:No data recorded Affect:No data recorded  Thought Process  Thought Processes:No data recorded Descriptions of Associations:No data recorded Orientation:No data recorded Thought Content:No data recorded History of Schizophrenia/Schizoaffective disorder:No  Duration of Psychotic Symptoms:No data recorded Hallucinations:No data recorded Ideas of Reference:No data recorded Suicidal Thoughts:No data recorded Homicidal Thoughts:No data recorded  Sensorium  Memory:No data  recorded Judgment:No data recorded Insight:No data recorded  Executive Functions  Concentration:No data recorded Attention Span:No data recorded Recall:No data recorded Fund of Knowledge:No data recorded Language:No data recorded  Psychomotor Activity  Psychomotor Activity:No data recorded  Assets  Assets:No data recorded  Sleep  Sleep:No data recorded   Physical Exam: Physical Exam Vitals and nursing note reviewed.  Constitutional:      Appearance: Normal appearance. He is normal weight.  Neurological:     General: No focal deficit present.     Mental Status: He is alert and oriented to person, place, and time.  Psychiatric:        Attention and Perception: Attention and perception normal.        Mood and Affect: Mood is depressed. Affect is flat.        Speech: Speech normal.        Behavior: Behavior normal. Behavior is cooperative.        Thought Content: Thought content normal.        Cognition and Memory: Cognition and memory normal.        Judgment: Judgment normal.   Review of Systems  Constitutional: Negative.   HENT: Negative.    Eyes: Negative.   Respiratory: Negative.    Cardiovascular: Negative.   Gastrointestinal: Negative.   Genitourinary: Negative.   Musculoskeletal: Negative.   Skin: Negative.   Neurological: Negative.   Endo/Heme/Allergies: Negative.   Psychiatric/Behavioral:  Positive for depression.   Blood pressure 130/73, pulse 76, temperature 97.9 F (36.6 C), temperature source Oral, resp. rate 20, height 5\' 10"  (1.778 m), weight 82.6 kg, SpO2 95 %. Body mass index is 26.11 kg/m.   Treatment Plan Summary: Daily contact with patient to assess and evaluate  symptoms and progress in treatment, Medication management, and Plan continue current medications.  Parks Ranger, DO 11/24/2021, 11:51 AM

## 2021-11-24 NOTE — Progress Notes (Signed)
°   11/24/21 2200  Psych Admission Type (Psych Patients Only)  Admission Status Voluntary  Psychosocial Assessment  Patient Complaints None  Eye Contact Fair  Facial Expression Flat  Affect Appropriate to circumstance  Speech Logical/coherent  Interaction Minimal  Motor Activity Slow  Appearance/Hygiene In scrubs  Behavior Characteristics Cooperative;Calm  Mood Pleasant  Thought Process  Coherency WDL  Content WDL  Delusions None reported or observed  Perception WDL  Hallucination None reported or observed  Judgment WDL  Danger to Self  Current suicidal ideation? Denies  Danger to Others  Danger to Others None reported or observed   Patient compliant with treatment interacting well with Peers and staff stayed in dayroom watching TV. Denies SI/HI/A/VH and verbally contracted for safety.   Support and encouragement provided as needed. Pt went to bed at 10 pm. Will continue to monitor.

## 2021-11-24 NOTE — Plan of Care (Signed)
Pt denies SI/HI/AVH. Problem: Education: Goal: Knowledge of [facility] Education information/materials will improve 11/24/2021 0419 by Randon Goldsmith, RN Outcome: Progressing 11/24/2021 0408 by Randon Goldsmith, RN Outcome: Progressing Goal: Emotional status will improve 11/24/2021 0419 by Randon Goldsmith, RN Outcome: Progressing 11/24/2021 0408 by Randon Goldsmith, RN Outcome: Progressing Goal: Mental status will improve 11/24/2021 0419 by Randon Goldsmith, RN Outcome: Progressing 11/24/2021 0408 by Randon Goldsmith, RN Outcome: Progressing Goal: Verbalization of understanding the information provided will improve 11/24/2021 0419 by Randon Goldsmith, RN Outcome: Progressing 11/24/2021 0408 by Randon Goldsmith, RN Outcome: Progressing   Problem: Health Behavior/Discharge Planning: Goal: Identification of resources available to assist in meeting health care needs will improve 11/24/2021 0419 by Randon Goldsmith, RN Outcome: Progressing 11/24/2021 0408 by Randon Goldsmith, RN Outcome: Progressing Goal: Compliance with treatment plan for underlying cause of condition will improve 11/24/2021 0419 by Randon Goldsmith, RN Outcome: Progressing 11/24/2021 0408 by Randon Goldsmith, RN Outcome: Progressing   Problem: Education: Goal: Utilization of techniques to improve thought processes will improve 11/24/2021 0419 by Randon Goldsmith, RN Outcome: Progressing 11/24/2021 0408 by Randon Goldsmith, RN Outcome: Progressing Goal: Knowledge of the prescribed therapeutic regimen will improve 11/24/2021 0419 by Randon Goldsmith, RN Outcome: Progressing 11/24/2021 0408 by Randon Goldsmith, RN Outcome: Progressing   Problem: Activity: Goal: Interest or engagement in leisure activities will improve 11/24/2021 0419 by Randon Goldsmith, RN Outcome: Progressing 11/24/2021 0408 by Randon Goldsmith, RN Outcome: Progressing Goal: Imbalance in  normal sleep/wake cycle will improve 11/24/2021 0419 by Randon Goldsmith, RN Outcome: Progressing 11/24/2021 0408 by Randon Goldsmith, RN Outcome: Progressing   Problem: Coping: Goal: Coping ability will improve 11/24/2021 0419 by Randon Goldsmith, RN Outcome: Progressing 11/24/2021 0408 by Randon Goldsmith, RN Outcome: Progressing Goal: Will verbalize feelings 11/24/2021 0419 by Randon Goldsmith, RN Outcome: Progressing 11/24/2021 0408 by Randon Goldsmith, RN Outcome: Progressing   Problem: Health Behavior/Discharge Planning: Goal: Ability to make decisions will improve 11/24/2021 0419 by Randon Goldsmith, RN Outcome: Progressing 11/24/2021 0408 by Randon Goldsmith, RN Outcome: Progressing Goal: Compliance with therapeutic regimen will improve 11/24/2021 0419 by Randon Goldsmith, RN Outcome: Progressing 11/24/2021 0408 by Randon Goldsmith, RN Outcome: Progressing   Problem: Role Relationship: Goal: Will demonstrate positive changes in social behaviors and relationships 11/24/2021 0419 by Randon Goldsmith, RN Outcome: Progressing 11/24/2021 0408 by Randon Goldsmith, RN Outcome: Progressing   Problem: Safety: Goal: Ability to disclose and discuss suicidal ideas will improve 11/24/2021 0419 by Randon Goldsmith, RN Outcome: Progressing 11/24/2021 0408 by Randon Goldsmith, RN Outcome: Progressing Goal: Ability to identify and utilize support systems that promote safety will improve 11/24/2021 0419 by Randon Goldsmith, RN Outcome: Progressing 11/24/2021 0408 by Randon Goldsmith, RN Outcome: Progressing   Problem: Self-Concept: Goal: Will verbalize positive feelings about self 11/24/2021 0419 by Randon Goldsmith, RN Outcome: Progressing 11/24/2021 0408 by Randon Goldsmith, RN Outcome: Progressing Goal: Level of anxiety will decrease 11/24/2021 0419 by Randon Goldsmith, RN Outcome: Progressing 11/24/2021 0408 by Randon Goldsmith, RN Outcome: Progressing

## 2021-11-24 NOTE — Progress Notes (Signed)
Recreation Therapy Notes   Date: 11/24/2021  Time: 1:25pm    Location: Craft room   Behavioral response: Appropriate  Intervention Topic:  Relaxation   Discussion/Intervention:  Group content today was focused on relaxation. The group defined relaxation and identified healthy ways to relax. Individuals expressed how much time they spend relaxing. Patients expressed how much their life would be if they did not make time for themselves to relax. The group stated ways they could improve their relaxation techniques in the future.  Individuals participated in the intervention Time to Relax where they had a chance to experience different relaxation techniques.  Clinical Observations/Feedback: Patient came to group and stated he relaxes by laying down. Individual was social with peers and staff while participating in the intervention.    Zoe Creasman LRT/CTRS         Biviana Saddler 11/24/2021 2:31 PM

## 2021-11-24 NOTE — Plan of Care (Signed)
Pt denies SI/HI/AVH. Problem: Education: Goal: Knowledge of [facility] Education information/materials will improve Outcome: Progressing Goal: Emotional status will improve Outcome: Progressing Goal: Mental status will improve Outcome: Progressing Goal: Verbalization of understanding the information provided will improve Outcome: Progressing   Problem: Health Behavior/Discharge Planning: Goal: Identification of resources available to assist in meeting health care needs will improve Outcome: Progressing Goal: Compliance with treatment plan for underlying cause of condition will improve Outcome: Progressing   Problem: Education: Goal: Utilization of techniques to improve thought processes will improve Outcome: Progressing Goal: Knowledge of the prescribed therapeutic regimen will improve Outcome: Progressing   Problem: Activity: Goal: Interest or engagement in leisure activities will improve Outcome: Progressing Goal: Imbalance in normal sleep/wake cycle will improve Outcome: Progressing   Problem: Coping: Goal: Coping ability will improve Outcome: Progressing Goal: Will verbalize feelings Outcome: Progressing   Problem: Health Behavior/Discharge Planning: Goal: Ability to make decisions will improve Outcome: Progressing Goal: Compliance with therapeutic regimen will improve Outcome: Progressing   Problem: Role Relationship: Goal: Will demonstrate positive changes in social behaviors and relationships Outcome: Progressing   Problem: Safety: Goal: Ability to disclose and discuss suicidal ideas will improve Outcome: Progressing Goal: Ability to identify and utilize support systems that promote safety will improve Outcome: Progressing   Problem: Self-Concept: Goal: Will verbalize positive feelings about self Outcome: Progressing Goal: Level of anxiety will decrease Outcome: Progressing

## 2021-11-24 NOTE — Plan of Care (Signed)
Patient is alert and oriented times 4. Mood and affect flat and pleasant. Patient denies pain at this time. He denies SI, HI, and AVH. Also denies feelings of anxiety and depression at this time. States he slept pretty good last night. Morning meds given whole by mouth W/O difficulty. Ate breakfast in day room- appetite good. Patient remains on unit with Q15 minute checks in place.   Problem: Education: Goal: Knowledge of [facility] Education information/materials will improve Outcome: Progressing Goal: Emotional status will improve Outcome: Progressing Goal: Mental status will improve Outcome: Progressing Goal: Verbalization of understanding the information provided will improve Outcome: Progressing   Problem: Health Behavior/Discharge Planning: Goal: Identification of resources available to assist in meeting health care needs will improve Outcome: Progressing Goal: Compliance with treatment plan for underlying cause of condition will improve Outcome: Progressing   Problem: Education: Goal: Utilization of techniques to improve thought processes will improve Outcome: Progressing Goal: Knowledge of the prescribed therapeutic regimen will improve Outcome: Progressing   Problem: Activity: Goal: Interest or engagement in leisure activities will improve Outcome: Progressing Goal: Imbalance in normal sleep/wake cycle will improve Outcome: Progressing   Problem: Coping: Goal: Coping ability will improve Outcome: Progressing Goal: Will verbalize feelings Outcome: Progressing   Problem: Health Behavior/Discharge Planning: Goal: Ability to make decisions will improve Outcome: Progressing Goal: Compliance with therapeutic regimen will improve Outcome: Progressing   Problem: Role Relationship: Goal: Will demonstrate positive changes in social behaviors and relationships Outcome: Progressing   Problem: Safety: Goal: Ability to disclose and discuss suicidal ideas will  improve Outcome: Progressing Goal: Ability to identify and utilize support systems that promote safety will improve Outcome: Progressing   Problem: Self-Concept: Goal: Will verbalize positive feelings about self Outcome: Progressing Goal: Level of anxiety will decrease Outcome: Progressing

## 2021-11-25 DIAGNOSIS — F332 Major depressive disorder, recurrent severe without psychotic features: Secondary | ICD-10-CM | POA: Diagnosis not present

## 2021-11-25 NOTE — Progress Notes (Signed)
Patient is alert and oriented x 4. Patient is pleasant and cooperative today with a flat affect. Patient denies SI, HI and AVH. Patient denies having any anxiety and depression but verbalized a mild pain of 4/10 to his hip. PRN medication administered and effective. Scheduled medications administered as ordered and tolerated well. Patient ate breakfast in day room in the day room with good appetite.  Patient current in room resting. Q 15 minutes Safety checks maintained, will continue to monitor.

## 2021-11-25 NOTE — Progress Notes (Signed)
Pt standing at nurses station; calm, cooperative. Pt states "I'm alright" when asked how he feels. Pt denies pain and SI/HI/AVH at this time. Pt reports that he "slept some" last night and that he has trouble falling asleep but no trouble staying asleep. Pt states that his appetite is "not good". He says that he does not eat well because "they serve the same food". Pt educated on the importance of eating well and hydration; he declined offer for nutritional supplement because he does not like them. Pt encouraged to drink plenty of fluids even if he does not eat well. Pt denies anxiety and depression at this time. No acute distress noted.

## 2021-11-25 NOTE — Progress Notes (Signed)
BHT reports that pt's blood pressure was 79/62. Pt given snack and Gatorade.

## 2021-11-25 NOTE — Progress Notes (Signed)
[facility] MD Progress Note  11/25/2021 1:06 PM [name]  MRN:  [id number] Subjective:[name] states that his mood is improved.  He is sleeping better.  He is taking his medicines as prescribed and denies any side effects.  His affect is improved.  He denies any suicidal ideation.  He plans on following up with [facility] in [place].  He feels that he is ready for discharge tomorrow.  Principal Problem: MDD (major depressive disorder), recurrent episode, severe ([place]) Diagnosis: Principal Problem:   MDD (major depressive disorder), recurrent episode, severe ([place]) Active Problems:   Major depressive disorder, recurrent episode, severe ([place])  Total Time spent with patient: 15 minutes  Past Psychiatric History: None  Past Medical History:  Past Medical History:  Diagnosis Date   Atrial fibrillation ([place]) 08/2009   CAD (coronary artery disease)    s/p CABG -- 1992   Depression    ESRD (end stage renal disease) ([place])    HLD (hyperlipidemia)    HTN (hypertension)    Idiopathic thrombocytopenia ([facility])     Past Surgical History:  Procedure Laterality Date   APPENDECTOMY     CORONARY ARTERY BYPASS GRAFT     FACIAL COSMETIC SURGERY     GALLBLADDER SURGERY     HERNIA REPAIR     KIDNEY TRANSPLANT     LEFT HEART CATH AND CORONARY ANGIOGRAPHY N/A 08/07/2018   Procedure: LEFT HEART CATH AND CORONARY ANGIOGRAPHY;  Surgeon: Sherren Mocha, MD;  Location: [facility];  Service: Cardiovascular;  Laterality: N/A;   LIVER TRANSPLANT     PRESSURE SENSOR/CARDIOMEMS N/A 08/04/2021   Procedure: PRESSURE SENSOR/CARDIOMEMS;  Surgeon: Larey Dresser, MD;  Location: [facility];  Service: Cardiovascular;  Laterality: N/A;   RIGHT HEART CATH N/A 06/25/2021   Procedure: RIGHT HEART CATH;  Surgeon: Larey Dresser, MD;  Location: [facility];  Service: Cardiovascular;  Laterality: N/A;   Family History:  Family History  Problem Relation Age of Onset   Atrial fibrillation Mother     Breast cancer Mother    Renal cancer Father    Hypertension Brother    Hyperlipidemia Brother    Other Brother        stent    Renal Disease Maternal Grandmother    Stroke Maternal Grandmother    Heart attack Maternal Grandfather    Cancer Paternal Grandmother    Heart failure Paternal Grandfather    Emphysema Paternal Grandfather    Bronchiolitis Paternal Grandfather     Social History:  Social History   Substance and Sexual Activity  Alcohol Use No     Social History   Substance and Sexual Activity  Drug Use No    Social History   Socioeconomic History   Marital status: Single    Spouse name: Not on file   Number of children: 0   Years of education: Not on file   Highest education level: Not on file  Occupational History   Occupation: retired  Tobacco Use   Smoking status: Never   Smokeless tobacco: Never  Vaping Use   Vaping Use: Never used  Substance and Sexual Activity   Alcohol use: No   Drug use: No   Sexual activity: Not on file  Other Topics Concern   Not on file  Social History Narrative   Not on file   Social Determinants of Health   Financial Resource Strain: Low Risk    Difficulty of Paying Living Expenses: Not very hard  Food Insecurity: No Food Insecurity   Worried About Charity fundraiser in the Last Year: Never true   Ran Out of Food in the Last Year: Never true  Transportation Needs: No Transportation Needs   Lack of Transportation (Medical): No   Lack of Transportation (Non-Medical): No  Physical Activity: Not on file  Stress: Not on file  Social Connections: Not on file   Additional Social History:                         Sleep: Good  Appetite:  Good  Current Medications: Current Facility-Administered Medications  Medication Dose Route Frequency Provider Last Rate Last Admin   acetaminophen (TYLENOL) tablet 650 mg  650 mg Oral Q6H PRN Suella Broad, FNP   650 mg at 11/25/21 0830   allopurinol (ZYLOPRIM)  tablet 100 mg  100 mg Oral Daily Suella Broad, FNP   100 mg at 11/25/21 0952   alum & mag hydroxide-simeth (MAALOX/MYLANTA) 200-200-20 MG/5ML suspension 30 mL  30 mL Oral Q4H PRN Suella Broad, FNP       amiodarone (PACERONE) tablet 200 mg  200 mg Oral Daily Suella Broad, FNP   200 mg at 11/25/21 0951   ARIPiprazole (ABILIFY) tablet 5 mg  5 mg Oral QHS Clapacs, John T, MD   5 mg at 11/24/21 2203   atorvastatin (LIPITOR) tablet 20 mg  20 mg Oral QHS Suella Broad, FNP   20 mg at 11/24/21 2203   clonazePAM (KLONOPIN) tablet 1 mg  1 mg Oral Q2000 Parks Ranger, DO   1 mg at 11/24/21 2100   dapagliflozin propanediol (FARXIGA) tablet 10 mg  10 mg Oral Daily Parks Ranger, DO   10 mg at 11/25/21 4196   ferrous sulfate tablet 325 mg  325 mg Oral Q breakfast Suella Broad, FNP   325 mg at 11/25/21 0830   magnesium hydroxide (MILK OF MAGNESIA) suspension 30 mL  30 mL Oral Daily PRN Suella Broad, FNP       magnesium oxide (MAG-OX) tablet 400 mg  400 mg Oral BID Suella Broad, FNP   400 mg at 11/25/21 0951   mirtazapine (REMERON) tablet 15 mg  15 mg Oral QHS Parks Ranger, DO   15 mg at 11/24/21 2204   omega-3 acid ethyl esters (LOVAZA) capsule 1 g  1 g Oral BID Suella Broad, FNP   1 g at 11/25/21 0954   ondansetron (ZOFRAN-ODT) disintegrating tablet 4 mg  4 mg Oral Q8H PRN Caroline Sauger, NP   4 mg at 11/17/21 1324   potassium chloride SA (KLOR-CON M) CR tablet 40 mEq  40 mEq Oral TID Suella Broad, FNP   40 mEq at 11/25/21 0951   tacrolimus (PROGRAF) capsule 0.5 mg  0.5 mg Oral [name], FNP   0.5 mg at 11/25/21 0954   tacrolimus (PROGRAF) capsule 1 mg  1 mg Oral [name], FNP   1 mg at 11/24/21 0901   torsemide (DEMADEX) tablet 100 mg  100 mg Oral Daily Suella Broad, FNP   100 mg at 11/25/21 0956   traZODone (DESYREL) tablet 100 mg  100 mg  Oral QHS PRN Clapacs, Madie Reno, MD   100 mg at 11/25/21 0153   vitamin B-12 (CYANOCOBALAMIN) tablet 1,000 mcg  1,000 mcg Oral Daily Suella Broad, FNP   1,000 mcg at 11/25/21 [phone]  warfarin (COUMADIN) tablet 2 mg  2 mg Oral QPM Suella Broad, FNP   2 mg at 11/24/21 1701   Warfarin - Physician Dosing Inpatient   Does not apply q1600 RauerForde Dandy, [facility]   Given at 11/24/21 1700    Lab Results: No results found for this or any previous visit (from the past 48 hour(s)).  Blood Alcohol level:  Lab Results  Component Value Date   ETH <10 01/60/1093    Metabolic Disorder Labs: Lab Results  Component Value Date   HGBA1C 5.2 11/17/2021   MPG 102.54 11/17/2021   MPG 105.41 08/05/2018   No results found for: PROLACTIN Lab Results  Component Value Date   CHOL 117 11/17/2021   TRIG 96 11/17/2021   HDL 28 (L) 11/17/2021   CHOLHDL 4.2 11/17/2021   VLDL 19 11/17/2021   LDLCALC 70 11/17/2021   LDLCALC 87 08/22/2019    Physical Findings: AIMS:  , ,  ,  ,    CIWA:    COWS:     Musculoskeletal: Strength & Muscle Tone: within normal limits Gait & Station: normal Patient leans: N/A  Psychiatric Specialty Exam:  Presentation  General Appearance: No data recorded Eye Contact:No data recorded Speech:No data recorded Speech Volume:No data recorded Handedness:No data recorded  Mood and Affect  Mood:No data recorded Affect:No data recorded  Thought Process  Thought Processes:No data recorded Descriptions of Associations:No data recorded Orientation:No data recorded Thought Content:No data recorded History of Schizophrenia/Schizoaffective disorder:No  Duration of Psychotic Symptoms:No data recorded Hallucinations:No data recorded Ideas of Reference:No data recorded Suicidal Thoughts:No data recorded Homicidal Thoughts:No data recorded  Sensorium  Memory:No data recorded Judgment:No data recorded Insight:No data recorded  Executive Functions   Concentration:No data recorded Attention Span:No data recorded Recall:No data recorded Fund of Knowledge:No data recorded Language:No data recorded  Psychomotor Activity  Psychomotor Activity:No data recorded  Assets  Assets:No data recorded  Sleep  Sleep:No data recorded   Physical Exam: Physical Exam Vitals and nursing note reviewed.  Constitutional:      Appearance: Normal appearance. He is normal weight.  Neurological:     General: No focal deficit present.     Mental Status: He is alert and oriented to person, place, and time.  Psychiatric:        Attention and Perception: Attention and perception normal.        Mood and Affect: Mood is anxious and depressed.        Speech: Speech normal.        Behavior: Behavior normal. Behavior is cooperative.        Thought Content: Thought content normal.        Cognition and Memory: Cognition and memory normal.        Judgment: Judgment normal.   Review of Systems  Constitutional: Negative.   HENT: Negative.    Eyes: Negative.   Respiratory: Negative.    Cardiovascular: Negative.   Gastrointestinal: Negative.   Genitourinary: Negative.   Musculoskeletal: Negative.   Skin: Negative.   Neurological: Negative.   Endo/Heme/Allergies: Negative.   Psychiatric/Behavioral:  Positive for depression.   Blood pressure 125/78, pulse 81, temperature 98.2 F (36.8 C), resp. rate 20, height 5\' 10"  (1.778 m), weight 82.6 kg, SpO2 97 %. Body mass index is 26.11 kg/m.   Treatment Plan Summary: Daily contact with patient to assess and evaluate symptoms and progress in treatment, Medication management, and Plan continue current medications.  Discharge tomorrow.  [place], DO 11/25/2021, 1:06  PM

## 2021-11-25 NOTE — Care Management Important Message (Signed)
Important Message  Patient Details  Name: [name] MRN: [id number] Date of Birth: April 09, 1951   Medicare Important Message Given:  Yes     Sedona Wenk A [place], Latanya Presser 11/25/2021, 4:14 PM

## 2021-11-25 NOTE — Progress Notes (Signed)
Recreation Therapy Notes  Date: 11/25/2021  Time: 1:15pm    Location: Courtyard     Behavioral response: N/A   Intervention Topic: Leisure    Discussion/Intervention: Patient did not attend group.   Clinical Observations/Feedback:  Patient did not attend group.    [name] LRT/CTRS         [name] 11/25/2021 3:47 PM

## 2021-11-26 DIAGNOSIS — F332 Major depressive disorder, recurrent severe without psychotic features: Secondary | ICD-10-CM | POA: Diagnosis not present

## 2021-11-26 LAB — CBC
HCT: 43.4 % (ref 39.0–52.0)
Hemoglobin: 14 g/dL (ref 13.0–17.0)
MCH: 27.5 pg (ref 26.0–34.0)
MCHC: 32.3 g/dL (ref 30.0–36.0)
MCV: 85.3 fL (ref 80.0–100.0)
Platelets: 90 10*3/uL — ABNORMAL LOW (ref 150–400)
RBC: 5.09 MIL/uL (ref 4.22–5.81)
RDW: 17.9 % — ABNORMAL HIGH (ref 11.5–15.5)
WBC: 3.4 10*3/uL — ABNORMAL LOW (ref 4.0–10.5)
nRBC: 0 % (ref 0.0–0.2)

## 2021-11-26 LAB — PROTIME-INR
INR: 2.8 — ABNORMAL HIGH (ref 0.8–1.2)
Prothrombin Time: 29.4 seconds — ABNORMAL HIGH (ref 11.4–15.2)

## 2021-11-26 MED ORDER — MIRTAZAPINE 15 MG PO TABS: 15.0000 mg | ORAL_TABLET | Freq: Every day | ORAL | 2 refills | Status: DC

## 2021-11-26 MED ORDER — CLONAZEPAM 1 MG PO TABS: 1.0000 mg | ORAL_TABLET | Freq: Every day | ORAL | 2 refills | Status: DC

## 2021-11-26 MED ORDER — ARIPIPRAZOLE 5 MG PO TABS: 5.0000 mg | ORAL_TABLET | Freq: Every day | ORAL | 2 refills | Status: DC

## 2021-11-26 MED ORDER — TRAZODONE HCL 100 MG PO TABS: 100.0000 mg | ORAL_TABLET | Freq: Every evening | ORAL | 2 refills | Status: AC | PRN

## 2021-11-26 NOTE — Progress Notes (Signed)
Patient is calm and cooperative with assessment. He denies SI, HI, and AVH. He also denies depression and anxiety. Patient denies pain and other physical problems. Patient reports that he did not sleep well last night despite receiving PRN trazodone, but is hopeful about getting better rest after discharge. Patient remains safe on the unit at this time.

## 2021-11-26 NOTE — BHH Suicide Risk Assessment (Signed)
[facility] Discharge Suicide Risk Assessment   Principal Problem: MDD (major depressive disorder), recurrent episode, severe ([place]) Discharge Diagnoses: Principal Problem:   MDD (major depressive disorder), recurrent episode, severe ([place]) Active Problems:   Major depressive disorder, recurrent episode, severe ([place])   Total Time spent with patient: 1 hour  Musculoskeletal: Strength & Muscle Tone: within normal limits Gait & Station: normal Patient leans: N/A  Psychiatric Specialty Exam  Presentation  General Appearance: No data recorded Eye Contact:No data recorded Speech:No data recorded Speech Volume:No data recorded Handedness:No data recorded  Mood and Affect  Mood:No data recorded Duration of Depression Symptoms: Greater than two weeks  Affect:No data recorded  Thought Process  Thought Processes:No data recorded Descriptions of Associations:No data recorded Orientation:No data recorded Thought Content:No data recorded History of Schizophrenia/Schizoaffective disorder:No  Duration of Psychotic Symptoms:No data recorded Hallucinations:No data recorded Ideas of Reference:No data recorded Suicidal Thoughts:No data recorded Homicidal Thoughts:No data recorded  Sensorium  Memory:No data recorded Judgment:No data recorded Insight:No data recorded  Executive Functions  Concentration:No data recorded Attention Span:No data recorded Recall:No data recorded Fund of Knowledge:No data recorded Language:No data recorded  Psychomotor Activity  Psychomotor Activity:No data recorded  Assets  Assets:No data recorded  Sleep  Sleep:No data recorded  Physical Exam: Physical Exam Vitals and nursing note reviewed.  Constitutional:      Appearance: Normal appearance. He is normal weight.  Neurological:     General: No focal deficit present.     Mental Status: He is alert and oriented to person, place, and time.  Psychiatric:        Attention and Perception: Attention  and perception normal.        Mood and Affect: Mood and affect normal.        Speech: Speech normal.        Behavior: Behavior normal. Behavior is cooperative.        Thought Content: Thought content normal.        Cognition and Memory: Cognition and memory normal.        Judgment: Judgment normal.   Review of Systems  Constitutional: Negative.   HENT: Negative.    Eyes: Negative.   Respiratory: Negative.    Cardiovascular: Negative.   Gastrointestinal: Negative.   Genitourinary: Negative.   Musculoskeletal: Negative.   Skin: Negative.   Neurological: Negative.   Endo/Heme/Allergies: Negative.   Psychiatric/Behavioral: Negative.    Blood pressure 108/63, pulse 73, temperature 97.9 F (36.6 C), temperature source Oral, resp. rate 18, height 5\' 10"  (1.778 m), weight 82.6 kg, SpO2 100 %. Body mass index is 26.11 kg/m.  Mental Status Per Nursing Assessment::   On Admission:  NA  Demographic Factors:  Male, Age 11 or older, Caucasian, and Living alone  Loss Factors: NA  Historical Factors: NA  Risk Reduction Factors:   Positive coping skills or problem solving skills  Continued Clinical Symptoms:  Depression:   Insomnia  Cognitive Features That Contribute To Risk:  None    Suicide Risk:  Minimal: No identifiable suicidal ideation.  Patients presenting with no risk factors but with morbid ruminations; may be classified as minimal risk based on the severity of the depressive symptoms   Follow-up [place], [facility] Follow up.   Why: Please attend walk-in hours to schedule your medication managment appointment within the next 7 days.  Walk in hours are from Mon-Fri 8AM to 3PM Thanks! Contact information: [place] [address] [phone]  Plan Of Care/Follow-up recommendations: [facility] [place], DO 11/26/2021, 11:09 AM

## 2021-11-26 NOTE — Progress Notes (Signed)
°  [facility] Adult Case Management Discharge Plan :  Will you be returning to the same living situation after discharge:  Yes,  pt's home At discharge, do you have transportation home?: Yes,  pt's brother is providing transportation home Do you have the ability to pay for your medications: Yes,  pt has Medicare Part A & B  Release of information consent forms completed and in the chart;  Patient's signature needed at discharge.  Patient to Follow up at:  Follow-up [place], [facility] Follow up.   Why: Please attend walk-in hours to schedule your medication managment appointment within the next 7 days.  Walk in hours are from Mon-Fri 8AM to 3PM Thanks! Contact information: [place] [address] [phone]                 Next level of care provider has access to [place] and Suicide Prevention discussed: Yes,  completed with pt     Has patient been referred to the Quitline?: N/A patient is not a smoker  Patient has been referred for addiction treatment: N/A  Shantanique Hodo A [place], [place] 11/26/2021, 10:40 AM

## 2021-11-26 NOTE — Progress Notes (Signed)
Recreation Therapy Notes  INPATIENT RECREATION TR PLAN  Patient Details Name: [name] MRN: [id number] DOB: [birth year] Today's Date: 11/26/2021  Rec Therapy Plan Is patient appropriate for Therapeutic Recreation?: Yes Treatment times per week: at least 3 Estimated Length of Stay: 5-7 days TR Treatment/Interventions: Group participation (Comment)  Discharge Criteria Pt will be discharged from therapy if:: Discharged Treatment plan/goals/alternatives discussed and agreed upon by:: Patient/family  Discharge Summary Short term goals set: Patient will identify 3 positive coping skills strategies to use post d/c within 5 recreation therapy group sessions Short term goals met: Complete Progress toward goals comments: Groups attended Which groups?: Goal setting, Communication, Other (Comment) (Relaxation, Self-care, Team work, Problem Solving) Reason goals not met: N/A Therapeutic equipment acquired: N/A Reason patient discharged from therapy: Discharge from hospital Pt/family agrees with progress & goals achieved: Yes Date patient discharged from therapy: 11/26/21   [name] 11/26/2021, 3:44 PM

## 2021-11-26 NOTE — Plan of Care (Signed)
Problem: Coping Skills Goal: STG - Patient will identify 3 positive coping skills strategies to use post d/c within 5 recreation therapy group sessions Description: STG - Patient will identify 3 positive coping skills strategies to use post d/c within 5 recreation therapy group sessions Outcome: Completed/Met

## 2021-11-26 NOTE — Progress Notes (Signed)
Pt was educated on prescriptions and follow up care. Pt questions were answered and pt verbalized understanding and did not voice any concerns. Pt's belongings were returned. Pt was safely discharged to the [facility].

## 2021-11-26 NOTE — Discharge Summary (Signed)
Alcohol Physician Discharge Summary Note  Patient:  [name] is an 71 y.o., male MRN:  [id number] DOB:  [birth year] Patient phone:  [phone] (home)  Patient address:   [place] [id number],  Total Time spent with patient: 1 hour  Date of Admission:  11/16/2021 Date of Discharge: 11/26/2021  Reason for Admission:  [name] is a 71 year old white male who voluntarily admits himself to the geriatric psychiatry unit.  He was transferred from [facility].  Currently lives in [place] not far from his brother.  He lives alone.  He endorses anhedonia, depressed mood, anxiety, racing thoughts and insomnia.  He states that he got confused with all of his medications and had stopped taking Cymbalta for the past 2 months but restarted it 1 week ago.  It is prescribed by his primary care physician.  He has a lot of medical problems including A. 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  He has never been married and does not have any children.  He used to work in the [employer] and is currently retired.  Never been psychiatrically hospitalized.  He states that he saw a psychiatrist outpatient and [facility] for depression many years ago.  He denies any smoking.  He is currently on Seroquel and states is not helping him sleep nor with his negative thoughts.  His biggest complaint right now is anxiety.  He is able to contract for safety in the hospital.  He denies any auditory or visual hallucinations.  Principal Problem: MDD (major depressive disorder), recurrent episode, severe ([place]) Discharge Diagnoses: Principal Problem:   MDD (major depressive disorder), recurrent episode, severe ([place]) Active Problems:   Major depressive disorder, recurrent episode, severe ([place])   Past Psychiatric History: None  Past Medical History:  Past Medical History:  Diagnosis Date   Atrial fibrillation ([place]) 08/2009   CAD (coronary artery disease)     s/p CABG -- 1992   Depression    ESRD (end stage renal disease) ([place])    HLD (hyperlipidemia)    HTN (hypertension)    Idiopathic thrombocytopenia (H[facility])     Past Surgical History:  Procedure Laterality Date   APPENDECTOMY     CORONARY ARTERY BYPASS GRAFT     FACIAL COSMETIC SURGERY     GALLBLADDER SURGERY     HERNIA REPAIR     KIDNEY TRANSPLANT     LEFT HEART CATH AND CORONARY ANGIOGRAPHY N/A 08/07/2018   Procedure: LEFT HEART CATH AND CORONARY ANGIOGRAPHY;  Surgeon: Sherren Mocha, MD;  Location: [place] [facility];  Service: Cardiovascular;  Laterality: N/A;   LIVER TRANSPLANT     PRESSURE SENSOR/CARDIOMEMS N/A 08/04/2021   Procedure: PRESSURE SENSOR/CARDIOMEMS;  Surgeon: Larey Dresser, MD;  Location: [place] [facility];  Service: Cardiovascular;  Laterality: N/A;   RIGHT HEART CATH N/A 06/25/2021   Procedure: RIGHT HEART CATH;  Surgeon: Larey Dresser, MD;  Location: [place] [facility];  Service: Cardiovascular;  Laterality: N/A;   Family History:  Family History  Problem Relation Age of Onset   Atrial fibrillation Mother    Breast cancer Mother    Renal cancer Father    Hypertension Brother    Hyperlipidemia Brother    Other Brother        stent    Renal Disease Maternal Grandmother    Stroke Maternal Grandmother    Heart attack Maternal Grandfather    Cancer Paternal Grandmother    Heart failure  Paternal Grandfather    Emphysema Paternal Grandfather    Bronchiolitis Paternal Grandfather     Social History:  Social History   Substance and Sexual Activity  Alcohol Use No     Social History   Substance and Sexual Activity  Drug Use No    Social History   Socioeconomic History   Marital status: Single    Spouse name: Not on file   Number of children: 0   Years of education: Not on file   Highest education level: Not on file  Occupational History   Occupation: retired  Tobacco Use   Smoking status: Never   Smokeless tobacco: Never  Vaping Use    Vaping Use: Never used  Substance and Sexual Activity   Alcohol use: No   Drug use: No   Sexual activity: Not on file  Other Topics Concern   Not on file  Social History Narrative   Not on file   Social Determinants of Health   Financial Resource Strain: Low Risk    Difficulty of Paying Living Expenses: Not very hard  Food Insecurity: No Food Insecurity   Worried About Charity fundraiser in the Last Year: Never true   [place] in the Last Year: Never true  Transportation Needs: No Transportation Needs   Lack of Transportation (Medical): No   Lack of Transportation (Non-Medical): No  Physical Activity: Not on file  Stress: Not on file  Social Connections: Not on file    Hospital Course:  [name] was admitted to the geriatric psychiatry unit on a voluntary basis.  He states that he has seen a psychiatrist 1 time but does not remember who and where.  He has been treated for depression by his primary care doctor with Cymbalta and Seroquel.  He states that the Seroquel does not help him sleep.  We increased it while he was on the unit and it still did not help.  His Cymbalta was discontinued and he was started on Abilify and Remeron.  Due to his complaints of insomnia Klonopin 1 mg was started at bedtime.  This did help him sleep but he continued to underestimate how much he slept.  He has had Halcion in the past because it is listed as an allergy with the effects of being too sedating.  He was not too sedated on the Klonopin and it seemed to be helping.  His mood and affect improved.  He took his medications as prescribed and denied any side effects.  It was felt that he maximize hospitalization and he was discharged home.  On the day of discharge he denied suicidal ideation or homicidal ideation, auditory or visual hallucinations.  His judgment and insight were good.  Physical Findings: AIMS: 0 , ,  ,  ,    CIWA:    COWS:     Musculoskeletal: Strength & Muscle Tone: within normal  limits Gait & Station: normal Patient leans: N/A   Psychiatric Specialty Exam:  Presentation  General Appearance: No data recorded Eye Contact:No data recorded Speech:No data recorded Speech Volume:No data recorded Handedness:No data recorded  Mood and Affect  Mood:No data recorded Affect:No data recorded  Thought Process  Thought Processes: Goal Directed  Descriptions of Associations: None  Orientation:X 3 Thought Content:Goal Directed History of Schizophrenia/Schizoaffective disorder:No  Duration of Psychotic Symptoms: N/A Hallucinations:No Ideas of Reference:No Suicidal Thoughts:No Homicidal Thoughts:No  Sensorium  Memory: Good Judgment:Good Insight:Good  Executive Functions  Concentration:No data recorded Attention Span:No data  recorded Recall:No data recorded [place] recorded Language:No data recorded  Psychomotor Activity  Psychomotor Activity:No data recorded  Assets  Assets:No data recorded  Sleep  Sleep:No data recorded   Physical Exam: Physical Exam Vitals and nursing note reviewed.  Constitutional:      Appearance: Normal appearance. He is normal weight.  Neurological:     General: No focal deficit present.     Mental Status: He is alert and oriented to person, place, and time.  Psychiatric:        Attention and Perception: Attention and perception normal.        Mood and Affect: Mood and affect normal.        Speech: Speech normal.        Behavior: Behavior normal. Behavior is cooperative.        Thought Content: Thought content normal.        Cognition and Memory: Cognition and memory normal.        Judgment: Judgment normal.   Review of Systems  Constitutional: Negative.   HENT: Negative.    Eyes: Negative.   Respiratory: Negative.    Cardiovascular: Negative.   Gastrointestinal: Negative.   Genitourinary: Negative.   Musculoskeletal: Negative.   Skin: Negative.   Neurological: Negative.    Endo/Heme/Allergies: Negative.   Psychiatric/Behavioral: Negative.    Blood pressure 108/63, pulse 73, temperature 97.9 F (36.6 C), temperature source Oral, resp. rate 18, height 5\' 10"  (1.778 m), weight 82.6 kg, SpO2 100 %. Body mass index is 26.11 kg/m.   Social History   Tobacco Use  Smoking Status Never  Smokeless Tobacco Never   Tobacco Cessation:  N/A, patient does not currently use tobacco products   Blood Alcohol level:  Lab Results  Component Value Date   ETH <10 27/74/1287    Metabolic Disorder Labs:  Lab Results  Component Value Date   HGBA1C 5.2 11/17/2021   MPG 102.54 11/17/2021   MPG 105.41 08/05/2018   No results found for: PROLACTIN Lab Results  Component Value Date   CHOL 117 11/17/2021   TRIG 96 11/17/2021   HDL 28 (L) 11/17/2021   CHOLHDL 4.2 11/17/2021   VLDL 19 11/17/2021   LDLCALC 70 11/17/2021   LDLCALC 87 08/22/2019    See Psychiatric Specialty Exam and Suicide Risk Assessment completed by Attending Physician prior to discharge.  Discharge destination:  Home  Is patient on multiple antipsychotic therapies at discharge:  No   Has Patient had three or more failed trials of antipsychotic monotherapy by history:  No  Recommended Plan for Multiple Antipsychotic Therapies: NA   Allergies as of 11/26/2021       Reactions   Other Anaphylaxis   Spider venom   Grapefruit Concentrate Other (See Comments)   Pt has been told not to eat grapefruit   Haloperidol Other (See Comments)   Caused the patient to be overly-sedated   Nsaids Other (See Comments)   Cannot take due to liver transplant   Rifampin Other (See Comments)   Caused flushing   Triazolam Other (See Comments)   Caused the patient to be overly-sedated        Medication List     STOP taking these medications    DULoxetine 60 MG capsule Commonly known as: CYMBALTA   melatonin 5 MG Tabs   predniSONE 5 MG tablet Commonly known as: DELTASONE   QUEtiapine 50 MG  tablet Commonly known as: SEROQUEL   zolpidem 5 MG tablet Commonly known as:  AMBIEN       TAKE these medications      Indication  allopurinol 100 MG tablet Commonly known as: ZYLOPRIM Take 100 mg by mouth daily.    amiodarone 200 MG tablet Commonly known as: PACERONE Take 1 tablet (200 mg total) by mouth daily.    ARIPiprazole 5 MG tablet Commonly known as: ABILIFY Take 1 tablet (5 mg total) by mouth at bedtime.    atorvastatin 40 MG tablet Commonly known as: LIPITOR Take 1 tablet (40 mg total) by mouth at bedtime. What changed: how much to take    calcitRIOL 0.25 MCG capsule Commonly known as: ROCALTROL Take 0.25 mcg by mouth daily.    clonazePAM 1 MG tablet Commonly known as: KLONOPIN Take 1 tablet (1 mg total) by mouth at bedtime.    dapagliflozin propanediol 10 MG Tabs tablet Commonly known as: FARXIGA Take 1 tablet (10 mg total) by mouth daily.    EPINEPHrine 0.3 mg/0.3 mL Devi Commonly known as: EPI-PEN Inject 0.3 mg into the muscle once as needed (severe allergic reaction).    ferrous sulfate 325 (65 FE) MG tablet Take 325 mg by mouth daily with breakfast.    fish oil-omega-3 fatty acids 1000 MG capsule Take 1 g by mouth 2 (two) times daily.    MAGnesium-Oxide 400 (240 Mg) MG tablet Generic drug: magnesium oxide Take 400 mg by mouth 2 (two) times daily.    mirtazapine 15 MG tablet Commonly known as: REMERON Take 1 tablet (15 mg total) by mouth at bedtime.    nitroGLYCERIN 0.4 MG SL tablet Commonly known as: NITROSTAT Place 0.4 mg under the tongue every 5 (five) minutes as needed for chest pain.    potassium chloride SA 20 MEQ tablet Commonly known as: KLOR-CON M Take 2 tablets (40 mEq total) by mouth 3 (three) times daily.    tacrolimus 0.5 MG capsule Commonly known as: PROGRAF Take 0.5 mg by mouth See admin instructions. Takes 0.5 mg  tablet by mouth one day and 1 mg tablets next day (1 and 2 tablets on alternate days)    torsemide 20 MG  tablet Commonly known as: Demadex Take 5 tablets (100 mg total) by mouth daily.    traZODone 100 MG tablet Commonly known as: DESYREL Take 1 tablet (100 mg total) by mouth at bedtime as needed for sleep.    Vitamin B12 1000 MCG Tbcr Take 1,000 mcg by mouth daily.    Vitamin D3 50 MCG (2000 UT) Tabs Take 2,000 Units by mouth daily.    warfarin 4 MG tablet Commonly known as: COUMADIN Take as directed. If you are unsure how to take this medication, talk to your nurse or doctor. Original instructions: TAKE AS DIRECTED BY COUMADIN CLINIC What changed: See the new instructions.         Follow-up [place], [facility] Follow up.   Why: Please attend walk-in hours to schedule your medication managment appointment within the next 7 days.  Walk in hours are from Mon-Fri 8AM to 3PM Thanks! Contact information: [place] [address] [phone]                 Follow-up recommendations:  [place] [facility]    Signed: [place] Ranger, DO 11/26/2021, 11:22 AM

## 2021-11-26 NOTE — Progress Notes (Signed)
Recreation Therapy Notes   Date: 11/26/2021   Time: 1:15pm     Location: Craft room       Behavioral response: N/A   Intervention Topic: Stress Management   Discussion/Intervention: Patient did not attend group.    Clinical Observations/Feedback:  Patient did not attend group.    Mertice Uffelman LRT/CTRS        Zakhari Fogel 11/26/2021 3:41 PM

## 2021-11-27 ENCOUNTER — Inpatient Hospital Stay (HOSPITAL_COMMUNITY): Admission: RE | Admit: 2021-11-27 | Payer: Medicare Other | Source: Ambulatory Visit

## 2021-12-02 ENCOUNTER — Ambulatory Visit (INDEPENDENT_AMBULATORY_CARE_PROVIDER_SITE_OTHER): Payer: Medicare Other

## 2021-12-02 DIAGNOSIS — I4821 Permanent atrial fibrillation: Secondary | ICD-10-CM | POA: Diagnosis not present

## 2021-12-02 DIAGNOSIS — Z7901 Long term (current) use of anticoagulants: Secondary | ICD-10-CM

## 2021-12-02 DIAGNOSIS — Z5181 Encounter for therapeutic drug level monitoring: Secondary | ICD-10-CM | POA: Diagnosis not present

## 2021-12-02 LAB — POCT INR: INR: 2.6 (ref 2.0–3.0)

## 2021-12-02 NOTE — Patient Instructions (Signed)
Description   Spoke with pt and instructed to continue taking Warfarin 2mg  daily. Remain consistent with your green leafy vegetables. Recheck INR 1 week.    **Weekly Personal assistant for insurance purposes. [phone]

## 2021-12-09 ENCOUNTER — Ambulatory Visit (INDEPENDENT_AMBULATORY_CARE_PROVIDER_SITE_OTHER): Payer: Medicare Other

## 2021-12-09 DIAGNOSIS — Z5181 Encounter for therapeutic drug level monitoring: Secondary | ICD-10-CM

## 2021-12-09 DIAGNOSIS — Z7901 Long term (current) use of anticoagulants: Secondary | ICD-10-CM

## 2021-12-09 DIAGNOSIS — I4821 Permanent atrial fibrillation: Secondary | ICD-10-CM | POA: Diagnosis not present

## 2021-12-09 LAB — POCT INR: INR: 1.3 — AB (ref 2.0–3.0)

## 2021-12-09 NOTE — Patient Instructions (Signed)
Description   Spoke with pt and instructed to take 1 tablet today and 1 tablet tomorrow and then continue taking Warfarin 2mg  daily. Remain consistent with your green leafy vegetables. Recheck INR 1 week.    **Weekly Personal assistant for insurance purposes. [phone]

## 2021-12-16 ENCOUNTER — Ambulatory Visit (INDEPENDENT_AMBULATORY_CARE_PROVIDER_SITE_OTHER): Payer: Medicare Other | Admitting: *Deleted

## 2021-12-16 DIAGNOSIS — I4821 Permanent atrial fibrillation: Secondary | ICD-10-CM

## 2021-12-16 DIAGNOSIS — Z7901 Long term (current) use of anticoagulants: Secondary | ICD-10-CM | POA: Diagnosis not present

## 2021-12-16 LAB — POCT INR: INR: 1.9 — AB (ref 2.0–3.0)

## 2021-12-16 NOTE — Patient Instructions (Signed)
Description   Spoke with pt and instructed to take 1 tablet today and then continue taking Warfarin 2mg  daily. Remain consistent with your green leafy vegetables. Recheck INR 1 week.    **Weekly Personal assistant for insurance purposes. [phone]

## 2021-12-21 ENCOUNTER — Telehealth (HOSPITAL_COMMUNITY): Payer: Self-pay | Admitting: Vascular Surgery

## 2021-12-21 NOTE — Telephone Encounter (Signed)
Pt left VM on scheduling line , returned pt call lvm to call back

## 2021-12-24 ENCOUNTER — Emergency Department (HOSPITAL_COMMUNITY)
Admission: EM | Admit: 2021-12-24 | Discharge: 2021-12-25 | Disposition: A | Discharge status: Other Acute Inpatient Hospital | Payer: Medicare Other | Attending: Emergency Medicine | Admitting: Emergency Medicine

## 2021-12-24 ENCOUNTER — Encounter (HOSPITAL_COMMUNITY): Payer: Self-pay | Admitting: Emergency Medicine

## 2021-12-24 ENCOUNTER — Ambulatory Visit (INDEPENDENT_AMBULATORY_CARE_PROVIDER_SITE_OTHER): Payer: Medicare Other

## 2021-12-24 ENCOUNTER — Other Ambulatory Visit: Payer: Self-pay

## 2021-12-24 DIAGNOSIS — I4821 Permanent atrial fibrillation: Secondary | ICD-10-CM | POA: Diagnosis not present

## 2021-12-24 DIAGNOSIS — Z7901 Long term (current) use of anticoagulants: Secondary | ICD-10-CM | POA: Insufficient documentation

## 2021-12-24 DIAGNOSIS — F32A Depression, unspecified: Secondary | ICD-10-CM | POA: Diagnosis not present

## 2021-12-24 DIAGNOSIS — R45851 Suicidal ideations: Secondary | ICD-10-CM | POA: Insufficient documentation

## 2021-12-24 DIAGNOSIS — Z79899 Other long term (current) drug therapy: Secondary | ICD-10-CM | POA: Diagnosis not present

## 2021-12-24 DIAGNOSIS — I120 Hypertensive chronic kidney disease with stage 5 chronic kidney disease or end stage renal disease: Secondary | ICD-10-CM | POA: Diagnosis not present

## 2021-12-24 DIAGNOSIS — Z940 Kidney transplant status: Secondary | ICD-10-CM | POA: Diagnosis not present

## 2021-12-24 DIAGNOSIS — I2510 Atherosclerotic heart disease of native coronary artery without angina pectoris: Secondary | ICD-10-CM | POA: Diagnosis not present

## 2021-12-24 DIAGNOSIS — N186 End stage renal disease: Secondary | ICD-10-CM | POA: Diagnosis not present

## 2021-12-24 DIAGNOSIS — Z20822 Contact with and (suspected) exposure to covid-19: Secondary | ICD-10-CM | POA: Insufficient documentation

## 2021-12-24 LAB — CBC WITH DIFFERENTIAL/PLATELET
Abs Immature Granulocytes: 0.05 10*3/uL (ref 0.00–0.07)
Basophils Absolute: 0 10*3/uL (ref 0.0–0.1)
Basophils Relative: 1 %
Eosinophils Absolute: 0.1 10*3/uL (ref 0.0–0.5)
Eosinophils Relative: 1 %
HCT: 48.2 % (ref 39.0–52.0)
Hemoglobin: 15.4 g/dL (ref 13.0–17.0)
Immature Granulocytes: 1 %
Lymphocytes Relative: 9 %
Lymphs Abs: 0.6 10*3/uL — ABNORMAL LOW (ref 0.7–4.0)
MCH: 27.8 pg (ref 26.0–34.0)
MCHC: 32 g/dL (ref 30.0–36.0)
MCV: 87.2 fL (ref 80.0–100.0)
Monocytes Absolute: 0.3 10*3/uL (ref 0.1–1.0)
Monocytes Relative: 4 %
Neutro Abs: 5.1 10*3/uL (ref 1.7–7.7)
Neutrophils Relative %: 84 %
Platelets: 76 10*3/uL — ABNORMAL LOW (ref 150–400)
RBC: 5.53 MIL/uL (ref 4.22–5.81)
RDW: 15.9 % — ABNORMAL HIGH (ref 11.5–15.5)
WBC: 6.1 10*3/uL (ref 4.0–10.5)
nRBC: 0 % (ref 0.0–0.2)

## 2021-12-24 LAB — COMPREHENSIVE METABOLIC PANEL
ALT: 15 U/L (ref 0–44)
AST: 16 U/L (ref 15–41)
Albumin: 3.8 g/dL (ref 3.5–5.0)
Alkaline Phosphatase: 69 U/L (ref 38–126)
Anion gap: 7 (ref 5–15)
BUN: 23 mg/dL (ref 8–23)
CO2: 30 mmol/L (ref 22–32)
Calcium: 9.7 mg/dL (ref 8.9–10.3)
Chloride: 102 mmol/L (ref 98–111)
Creatinine, Ser: 2.08 mg/dL — ABNORMAL HIGH (ref 0.61–1.24)
GFR, Estimated: 34 mL/min — ABNORMAL LOW (ref >60)
Glucose, Bld: 131 mg/dL — ABNORMAL HIGH (ref 70–99)
Potassium: 4.4 mmol/L (ref 3.5–5.1)
Sodium: 139 mmol/L (ref 135–145)
Total Bilirubin: 2.3 mg/dL — ABNORMAL HIGH (ref 0.3–1.2)
Total Protein: 7.1 g/dL (ref 6.5–8.1)

## 2021-12-24 LAB — ETHANOL: Alcohol, Ethyl (B): <10 mg/dL (ref <10)

## 2021-12-24 LAB — RAPID URINE DRUG SCREEN, HOSP PERFORMED
Amphetamines: NOT DETECTED
Barbiturates: NOT DETECTED
Benzodiazepines: NOT DETECTED
Cocaine: NOT DETECTED
Opiates: NOT DETECTED
Tetrahydrocannabinol: NOT DETECTED

## 2021-12-24 LAB — RESP PANEL BY RT-PCR (FLU A&B, COVID) ARPGX2
Influenza A by PCR: NEGATIVE
Influenza B by PCR: NEGATIVE
SARS Coronavirus 2 by RT PCR: NEGATIVE

## 2021-12-24 LAB — POCT INR: INR: 1.9 — AB (ref 2.0–3.0)

## 2021-12-24 LAB — PROTIME-INR
INR: 1.8 — ABNORMAL HIGH (ref 0.8–1.2)
Prothrombin Time: 21.1 seconds — ABNORMAL HIGH (ref 11.4–15.2)

## 2021-12-24 MED ORDER — ATORVASTATIN CALCIUM 10 MG PO TABS
20.0000 mg | ORAL_TABLET | Freq: Every day | ORAL | Status: DC
  Administered 2021-12-24: 20 mg via ORAL
  Filled 2021-12-24: qty 2

## 2021-12-24 MED ORDER — POTASSIUM CHLORIDE CRYS ER 20 MEQ PO TBCR
40.0000 meq | EXTENDED_RELEASE_TABLET | Freq: Three times a day (TID) | ORAL | Status: DC
  Administered 2021-12-24 – 2021-12-25 (×2): 40 meq via ORAL
  Filled 2021-12-24 (×2): qty 2

## 2021-12-24 MED ORDER — WARFARIN SODIUM 2 MG PO TABS
2.0000 mg | ORAL_TABLET | Freq: Every evening | ORAL | Status: DC
Start: 2021-12-24 — End: 2021-12-25
  Administered 2021-12-24: 2 mg via ORAL
  Filled 2021-12-24: qty 1

## 2021-12-24 MED ORDER — MAGNESIUM OXIDE -MG SUPPLEMENT 400 (240 MG) MG PO TABS
400.0000 mg | ORAL_TABLET | Freq: Two times a day (BID) | ORAL | Status: DC
  Administered 2021-12-24 – 2021-12-25 (×2): 400 mg via ORAL
  Filled 2021-12-24 (×2): qty 1

## 2021-12-24 MED ORDER — TRAZODONE HCL 100 MG PO TABS
100.0000 mg | ORAL_TABLET | Freq: Every evening | ORAL | Status: DC | PRN
  Filled 2021-12-24: qty 1

## 2021-12-24 MED ORDER — ARIPIPRAZOLE 5 MG PO TABS
5.0000 mg | ORAL_TABLET | Freq: Every day | ORAL | Status: DC
  Administered 2021-12-24: 5 mg via ORAL
  Filled 2021-12-24: qty 1

## 2021-12-24 MED ORDER — MIRTAZAPINE 7.5 MG PO TABS
15.0000 mg | ORAL_TABLET | Freq: Every day | ORAL | Status: DC
  Administered 2021-12-24: 15 mg via ORAL
  Filled 2021-12-24: qty 1
  Filled 2021-12-24: qty 2

## 2021-12-24 MED ORDER — AMIODARONE HCL 200 MG PO TABS
200.0000 mg | ORAL_TABLET | Freq: Every day | ORAL | Status: DC
  Administered 2021-12-25: 200 mg via ORAL
  Filled 2021-12-24: qty 1

## 2021-12-24 MED ORDER — TACROLIMUS 1 MG PO CAPS
1.0000 mg | ORAL_CAPSULE | ORAL | Status: DC
  Administered 2021-12-25: 1 mg via ORAL
  Filled 2021-12-24: qty 1

## 2021-12-24 MED ORDER — DAPAGLIFLOZIN PROPANEDIOL 10 MG PO TABS
10.0000 mg | ORAL_TABLET | Freq: Every day | ORAL | Status: DC
  Administered 2021-12-25: 10 mg via ORAL
  Filled 2021-12-24: qty 1

## 2021-12-24 MED ORDER — CLONAZEPAM 1 MG PO TABS
1.0000 mg | ORAL_TABLET | Freq: Every day | ORAL | Status: DC
  Administered 2021-12-24: 1 mg via ORAL
  Filled 2021-12-24: qty 1

## 2021-12-24 MED ORDER — WARFARIN - PHYSICIAN DOSING INPATIENT: Freq: Every day | Status: DC

## 2021-12-24 MED ORDER — TORSEMIDE 100 MG PO TABS
100.0000 mg | ORAL_TABLET | Freq: Every day | ORAL | Status: DC
  Administered 2021-12-25: 100 mg via ORAL
  Filled 2021-12-24: qty 1

## 2021-12-24 MED ORDER — TACROLIMUS 0.5 MG PO CAPS: 0.5000 mg | ORAL_CAPSULE | ORAL | Status: DC

## 2021-12-24 NOTE — Patient Instructions (Signed)
Description   Spoke with pt and instructed to take 1 tablet today and then START taking Warfarin 2mg  daily EXCEPT 4mg  on Mondays.  Remain consistent with your green leafy vegetables.  Recheck INR 1 week.    **Weekly Personal assistant for insurance purposes. [phone]

## 2021-12-24 NOTE — ED Provider Notes (Addendum)
[facility] DEPT Provider Note   CSN: [id number] Arrival date & time: 12/24/21  1517     History  Chief Complaint  Patient presents with   Suicidal    [name] is a 71 y.o. male.  HPI  Patient presents to the ED for evaluation of suicidal ideation.  Patient has history of coronary artery disease, atrial fibrillation, end-stage renal disease, thrombocytopenia, hyperlipidemia, hypertension, and depression.  Patient is status post kidney transplant.  Patient recently has been having issues with depression and suicidal ideation.  He was admitted to the hospital in January of this year for depression.  Patient was discharged after a couple of weeks and has been followed as an outpatient.  Patient does not feel like he got that much better.  Recently the symptoms have been getting worse.  He has been getting agitated and anxious.  He has been getting more depressed and is having thoughts of suicide without a specific plan.  Patient called his mental health provider and they said they would see him but he felt he needed more than just talking.  Patient feels like he needs to be hospitalized .  he denies any acute medical issues  Home Medications Prior to Admission medications   Medication Sig Start Date End Date Taking? Authorizing Provider  allopurinol (ZYLOPRIM) 100 MG tablet Take 100 mg by mouth daily.   Yes [provider]  amiodarone (PACERONE) 200 MG tablet Take 1 tablet (200 mg total) by mouth daily. 10/05/21  Yes Milford, Maricela Bo, FNP  ARIPiprazole (ABILIFY) 5 MG tablet Take 1 tablet (5 mg total) by mouth at bedtime. 11/26/21  Yes Parks Ranger, DO  atorvastatin (LIPITOR) 40 MG tablet Take 1 tablet (40 mg total) by mouth at bedtime. Patient taking differently: Take 20 mg by mouth at bedtime. 09/17/21  Yes Larey Dresser, MD  calcitRIOL (ROCALTROL) 0.25 MCG capsule Take 0.25 mcg by mouth daily.   Yes [provider]   Cholecalciferol (VITAMIN D3) 50 MCG (2000 UT) TABS Take 2,000 Units by mouth daily.   Yes [provider]  clonazePAM (KLONOPIN) 1 MG tablet Take 1 tablet (1 mg total) by mouth at bedtime. 11/26/21  Yes Parks Ranger, DO  Cyanocobalamin (VITAMIN B12) 1000 MCG TBCR Take 1,000 mcg by mouth daily.   Yes [provider]  dapagliflozin propanediol (FARXIGA) 10 MG TABS tablet Take 1 tablet (10 mg total) by mouth daily. 07/03/21  Yes Larey Dresser, MD  ferrous sulfate 325 (65 FE) MG tablet Take 325 mg by mouth daily with breakfast.   Yes [provider]  fish oil-omega-3 fatty acids 1000 MG capsule Take 1 g by mouth 2 (two) times daily.   Yes [provider]  MAGNESIUM-OXIDE 400 (240 Mg) MG tablet Take 400 mg by mouth 2 (two) times daily. 05/04/21  Yes [provider]  mirtazapine (REMERON) 15 MG tablet Take 1 tablet (15 mg total) by mouth at bedtime. 11/26/21  Yes Parks Ranger, DO  potassium chloride SA (KLOR-CON) 20 MEQ tablet Take 2 tablets (40 mEq total) by mouth 3 (three) times daily. 08/04/21  Yes Larey Dresser, MD  tacrolimus (PROGRAF) 0.5 MG capsule Take 0.5 mg by mouth See admin instructions. Takes 0.5 mg  tablet by mouth one day and 1 mg tablets next day (1 and 2 tablets on alternate days)   Yes [provider]  torsemide (DEMADEX) 20 MG tablet Take 5 tablets (100 mg total) by  mouth daily. 10/05/21  Yes Milford, Maricela Bo, FNP  traZODone (DESYREL) 100 MG tablet Take 1 tablet (100 mg total) by mouth at bedtime as needed for sleep. 11/26/21  Yes Parks Ranger, DO  warfarin (COUMADIN) 4 MG tablet TAKE AS DIRECTED BY COUMADIN CLINIC Patient taking differently: Take 2-4 mg by mouth See admin instructions. Take 4mg  by mouth on Monday, then take 2mg  (1/2 tablet) all other days 09/19/20  Yes Deboraha Sprang, MD  EPINEPHrine (EPI-PEN) 0.3 mg/0.3 mL DEVI Inject 0.3 mg into the muscle once as needed (severe allergic  reaction). Patient not taking: Reported on 12/24/2021    [provider]  nitroGLYCERIN (NITROSTAT) 0.4 MG SL tablet Place 0.4 mg under the tongue every 5 (five) minutes as needed for chest pain. Patient not taking: Reported on 12/24/2021    [provider]      Allergies    Other, Grapefruit concentrate, Haloperidol, Nsaids, Rifampin, and Triazolam    Review of Systems   Review of Systems  Constitutional:  Negative for fever.  Respiratory:  Negative for shortness of breath.   Cardiovascular:  Negative for chest pain.   Physical Exam Updated Vital Signs BP 129/76 (BP Location: Left Arm)    Pulse 82    Temp 97.6 F (36.4 C) (Oral)    Resp 18    Ht 1.778 m (5\' 10" )    Wt 82.6 kg    SpO2 100%    BMI 26.13 kg/m  Physical Exam Vitals and nursing note reviewed.  Constitutional:      Appearance: He is well-developed. He is not diaphoretic.  HENT:     Head: Normocephalic and atraumatic.     Right Ear: External ear normal.     Left Ear: External ear normal.  Eyes:     General: No scleral icterus.       Right eye: No discharge.        Left eye: No discharge.     Conjunctiva/sclera: Conjunctivae normal.  Neck:     Trachea: No tracheal deviation.  Cardiovascular:     Rate and Rhythm: Normal rate and regular rhythm.  Pulmonary:     Effort: Pulmonary effort is normal. No respiratory distress.     Breath sounds: Normal breath sounds. No stridor. No wheezing or rales.  Abdominal:     General: Bowel sounds are normal. There is no distension.     Palpations: Abdomen is soft.     Tenderness: There is no abdominal tenderness. There is no guarding or rebound.  Musculoskeletal:        General: No tenderness or deformity.     Cervical back: Neck supple.  Skin:    General: Skin is warm and dry.     Findings: No rash.  Neurological:     General: No focal deficit present.     Mental Status: He is alert.     Cranial Nerves: No cranial nerve deficit (no facial droop,  extraocular movements intact, no slurred speech).     Sensory: No sensory deficit.     Motor: No abnormal muscle tone or seizure activity.     Coordination: Coordination normal.  Psychiatric:        Mood and Affect: Mood is depressed.        Thought Content: Thought content includes suicidal ideation. Thought content does not include suicidal plan.    ED Results / Procedures / Treatments   Labs (all labs ordered are listed, but only abnormal results are displayed)  Labs Reviewed  COMPREHENSIVE METABOLIC PANEL - Abnormal; Notable for the following components:      Result Value   Glucose, Bld 131 (*)    Creatinine, Ser 2.08 (*)    Total Bilirubin 2.3 (*)    GFR, Estimated 34 (*)    All other components within normal limits  CBC WITH DIFFERENTIAL/PLATELET - Abnormal; Notable for the following components:   RDW 15.9 (*)    Platelets 76 (*)    Lymphs Abs 0.6 (*)    All other components within normal limits  PROTIME-INR - Abnormal; Notable for the following components:   Prothrombin Time 21.1 (*)    INR 1.8 (*)    All other components within normal limits  RESP PANEL BY RT-PCR (FLU A&B, COVID) ARPGX2  ETHANOL  RAPID URINE DRUG SCREEN, HOSP PERFORMED  PROTIME-INR    EKG None  Radiology No results found.  Procedures Procedures    Medications Ordered in ED Medications  warfarin (COUMADIN) tablet 2 mg (2 mg Oral Given 12/24/21 2321)  traZODone (DESYREL) tablet 100 mg (has no administration in time range)  torsemide (DEMADEX) tablet 100 mg (has no administration in time range)  tacrolimus (PROGRAF) capsule 0.5 mg (has no administration in time range)  potassium chloride SA (KLOR-CON M) CR tablet 40 mEq (40 mEq Oral Given 12/24/21 2249)  mirtazapine (REMERON) tablet 15 mg (15 mg Oral Given 12/24/21 2318)  magnesium oxide (MAG-OX) tablet 400 mg (400 mg Oral Given 12/24/21 2256)  dapagliflozin propanediol (FARXIGA) tablet 10 mg (has no administration in time range)  clonazePAM  (KLONOPIN) tablet 1 mg (1 mg Oral Given 12/24/21 2256)  amiodarone (PACERONE) tablet 200 mg (has no administration in time range)  ARIPiprazole (ABILIFY) tablet 5 mg (5 mg Oral Given 12/24/21 2255)  atorvastatin (LIPITOR) tablet 20 mg (20 mg Oral Given 12/24/21 2249)  tacrolimus (PROGRAF) capsule 1 mg (has no administration in time range)  Warfarin - Physician Dosing Inpatient (has no administration in time range)    ED Course/ Medical Decision Making/ A&P Clinical Course as of 12/25/21 0000  Thu Dec 24, 2021  1950 CBC with Diff(!) CBC normal [JK]  1950 Comprehensive metabolic panel(!) Creatinine elevated but similar to previous values [JK]  1950 Covid and flu negative. [JK]  1951 Protime-INR(!) [JK]  1951 INR similar to previous values [JK]  1951 Patient medically cleared at this time for psychiatric evaluation [JK]    Clinical Course User Index [JK] Dorie Rank, MD                           Medical Decision Making Amount and/or Complexity of Data Reviewed Labs: ordered. Decision-making details documented in ED Course.  Risk OTC drugs. Prescription drug management.  The patient has been placed in psychiatric observation due to the need to provide a safe environment for the patient while obtaining psychiatric consultation and evaluation, as well as ongoing medical and medication management to treat the patient's condition.  The patient has not been placed under full IVC at this time.  Patient presented to ED for evaluation of suicidal ideation.  Patient does have history of chronic depression.  No acute medical issues noted in the ED.  Patient is medically stable.  TTS consult placed.  Awaiting further evaluation and recommendation        Final Clinical Impression(s) / ED Diagnoses Final diagnoses:  Suicidal ideation  Depression, unspecified depression type    Rx / DC Orders ED Discharge  Orders     None         Dorie Rank, MD 12/24/21 2127    Dorie Rank,  MD 12/25/21 0000

## 2021-12-24 NOTE — Progress Notes (Addendum)
ANTICOAGULATION CONSULT NOTE - Initial Consult  Pharmacy Consult for Warfarin Indication: atrial fibrillation  Allergies  Allergen Reactions   Other Anaphylaxis    Spider venom   Grapefruit Concentrate Other (See Comments)    Pt has been told not to eat grapefruit   Haloperidol Other (See Comments)    Caused the patient to be overly-sedated   Nsaids Other (See Comments)    Cannot take due to liver transplant   Rifampin Other (See Comments)    Caused flushing   Triazolam Other (See Comments)    Caused the patient to be overly-sedated    Patient Measurements: Height: 5\' 10"  (177.8 cm) Weight: 82.6 kg (182 lb 1.6 oz) IBW/kg (Calculated) : 73    Vital Signs: Temp: 97.6 F (36.4 C) (02/16 2235) Temp Source: Oral (02/16 2235) BP: 129/76 (02/16 2235) Pulse Rate: 82 (02/16 2235)  Labs: Recent Labs    12/24/21 0000 12/24/21 1623 12/24/21 1750  HGB  --  15.4  --   HCT  --  48.2  --   PLT  --  76*  --   LABPROT  --   --  21.1*  INR 1.9*  --  1.8*  CREATININE  --  2.08*  --     Estimated Creatinine Clearance: 35.1 mL/min (A) (by C-G formula based on SCr of 2.08 mg/dL (H)).   Medical History: Past Medical History:  Diagnosis Date   Atrial fibrillation ([place]) 08/2009   CAD (coronary artery disease)    s/p CABG -- 1992   Depression    ESRD (end stage renal disease) ([facility])    HLD (hyperlipidemia)    HTN (hypertension)    Idiopathic thrombocytopenia ([facility])     Medications:  PTA:  Warfarin 2mg  daily except 4mg  on Mondays  Assessment: 71 yr male in ED with suicidal ideation PMH significant for CAD, AFib, thrombocytopenia, HLD, HTN, depression, s/p kidney transplant INR = 1.8 on admission  Goal of Therapy:  INR 2-3 Monitor platelets by anticoagulation protocol: Yes   Plan:  Warfarin 2mg  daily ordered initially by provider (2mg  given 2/16 @ 23:21) F/U AM INR and adjust dose warfarin as needed  Cross Jorge, Toribio Harbour, PharmD 12/24/2021,11:59 PM

## 2021-12-24 NOTE — ED Triage Notes (Signed)
Pt reports SI. Denies attempt or plan. Being seen at [facility] for SI

## 2021-12-25 ENCOUNTER — Inpatient Hospital Stay
Admission: AD | Admit: 2021-12-25 | Discharge: 2022-01-22 | DRG: 699 | Disposition: A | Discharge status: Home / Self Care | Payer: Medicare Other | Source: Intra-hospital | Attending: Psychiatry | Admitting: Psychiatry

## 2021-12-25 DIAGNOSIS — R45851 Suicidal ideations: Secondary | ICD-10-CM | POA: Diagnosis present

## 2021-12-25 DIAGNOSIS — I130 Hypertensive heart and chronic kidney disease with heart failure and stage 1 through stage 4 chronic kidney disease, or unspecified chronic kidney disease: Secondary | ICD-10-CM | POA: Diagnosis present

## 2021-12-25 DIAGNOSIS — Z62810 Personal history of physical and sexual abuse in childhood: Secondary | ICD-10-CM | POA: Diagnosis present

## 2021-12-25 DIAGNOSIS — F332 Major depressive disorder, recurrent severe without psychotic features: Secondary | ICD-10-CM | POA: Diagnosis present

## 2021-12-25 DIAGNOSIS — D84821 Immunodeficiency due to drugs: Secondary | ICD-10-CM | POA: Diagnosis present

## 2021-12-25 DIAGNOSIS — Z940 Kidney transplant status: Secondary | ICD-10-CM

## 2021-12-25 DIAGNOSIS — E785 Hyperlipidemia, unspecified: Secondary | ICD-10-CM | POA: Diagnosis present

## 2021-12-25 DIAGNOSIS — Z803 Family history of malignant neoplasm of breast: Secondary | ICD-10-CM | POA: Diagnosis not present

## 2021-12-25 DIAGNOSIS — I482 Chronic atrial fibrillation, unspecified: Secondary | ICD-10-CM | POA: Diagnosis present

## 2021-12-25 DIAGNOSIS — Z8249 Family history of ischemic heart disease and other diseases of the circulatory system: Secondary | ICD-10-CM

## 2021-12-25 DIAGNOSIS — Z83438 Family history of other disorder of lipoprotein metabolism and other lipidemia: Secondary | ICD-10-CM | POA: Diagnosis not present

## 2021-12-25 DIAGNOSIS — Z825 Family history of asthma and other chronic lower respiratory diseases: Secondary | ICD-10-CM

## 2021-12-25 DIAGNOSIS — T8619 Other complication of kidney transplant: Secondary | ICD-10-CM | POA: Diagnosis present

## 2021-12-25 DIAGNOSIS — R42 Dizziness and giddiness: Secondary | ICD-10-CM | POA: Diagnosis not present

## 2021-12-25 DIAGNOSIS — Z823 Family history of stroke: Secondary | ICD-10-CM | POA: Diagnosis not present

## 2021-12-25 DIAGNOSIS — I959 Hypotension, unspecified: Secondary | ICD-10-CM | POA: Diagnosis present

## 2021-12-25 DIAGNOSIS — Z951 Presence of aortocoronary bypass graft: Secondary | ICD-10-CM | POA: Diagnosis not present

## 2021-12-25 DIAGNOSIS — F32A Depression, unspecified: Secondary | ICD-10-CM | POA: Diagnosis not present

## 2021-12-25 DIAGNOSIS — N1832 Chronic kidney disease, stage 3b: Secondary | ICD-10-CM | POA: Diagnosis present

## 2021-12-25 DIAGNOSIS — Z91018 Allergy to other foods: Secondary | ICD-10-CM

## 2021-12-25 DIAGNOSIS — I2510 Atherosclerotic heart disease of native coronary artery without angina pectoris: Secondary | ICD-10-CM | POA: Diagnosis present

## 2021-12-25 DIAGNOSIS — G4733 Obstructive sleep apnea (adult) (pediatric): Secondary | ICD-10-CM | POA: Diagnosis present

## 2021-12-25 DIAGNOSIS — E871 Hypo-osmolality and hyponatremia: Secondary | ICD-10-CM | POA: Diagnosis not present

## 2021-12-25 DIAGNOSIS — I4891 Unspecified atrial fibrillation: Secondary | ICD-10-CM | POA: Diagnosis present

## 2021-12-25 DIAGNOSIS — Z944 Liver transplant status: Secondary | ICD-10-CM

## 2021-12-25 DIAGNOSIS — F339 Major depressive disorder, recurrent, unspecified: Secondary | ICD-10-CM | POA: Diagnosis present

## 2021-12-25 DIAGNOSIS — N189 Chronic kidney disease, unspecified: Secondary | ICD-10-CM | POA: Diagnosis not present

## 2021-12-25 DIAGNOSIS — Z7901 Long term (current) use of anticoagulants: Secondary | ICD-10-CM

## 2021-12-25 DIAGNOSIS — N179 Acute kidney failure, unspecified: Secondary | ICD-10-CM | POA: Diagnosis present

## 2021-12-25 DIAGNOSIS — F333 Major depressive disorder, recurrent, severe with psychotic symptoms: Secondary | ICD-10-CM | POA: Diagnosis not present

## 2021-12-25 DIAGNOSIS — Z79899 Other long term (current) drug therapy: Secondary | ICD-10-CM

## 2021-12-25 DIAGNOSIS — D693 Immune thrombocytopenic purpura: Secondary | ICD-10-CM | POA: Diagnosis present

## 2021-12-25 DIAGNOSIS — I5022 Chronic systolic (congestive) heart failure: Secondary | ICD-10-CM | POA: Diagnosis present

## 2021-12-25 DIAGNOSIS — G4700 Insomnia, unspecified: Secondary | ICD-10-CM | POA: Diagnosis present

## 2021-12-25 DIAGNOSIS — Y830 Surgical operation with transplant of whole organ as the cause of abnormal reaction of the patient, or of later complication, without mention of misadventure at the time of the procedure: Secondary | ICD-10-CM | POA: Diagnosis present

## 2021-12-25 DIAGNOSIS — Z79621 Long term (current) use of calcineurin inhibitor: Secondary | ICD-10-CM

## 2021-12-25 DIAGNOSIS — M79606 Pain in leg, unspecified: Secondary | ICD-10-CM | POA: Diagnosis not present

## 2021-12-25 DIAGNOSIS — Z8051 Family history of malignant neoplasm of kidney: Secondary | ICD-10-CM | POA: Diagnosis not present

## 2021-12-25 DIAGNOSIS — Z888 Allergy status to other drugs, medicaments and biological substances status: Secondary | ICD-10-CM

## 2021-12-25 DIAGNOSIS — N289 Disorder of kidney and ureter, unspecified: Secondary | ICD-10-CM | POA: Diagnosis not present

## 2021-12-25 DIAGNOSIS — F411 Generalized anxiety disorder: Secondary | ICD-10-CM | POA: Diagnosis present

## 2021-12-25 DIAGNOSIS — E875 Hyperkalemia: Secondary | ICD-10-CM | POA: Diagnosis not present

## 2021-12-25 LAB — PROTIME-INR
INR: 1.7 — ABNORMAL HIGH (ref 0.8–1.2)
Prothrombin Time: 20.2 seconds — ABNORMAL HIGH (ref 11.4–15.2)

## 2021-12-25 MED ORDER — MAGNESIUM OXIDE -MG SUPPLEMENT 400 (240 MG) MG PO TABS
400.0000 mg | ORAL_TABLET | Freq: Two times a day (BID) | ORAL | Status: DC
  Administered 2021-12-25 – 2022-01-22 (×56): 400 mg via ORAL
  Filled 2021-12-25 (×56): qty 1

## 2021-12-25 MED ORDER — WARFARIN - PHARMACIST DOSING INPATIENT: Freq: Every day | Status: DC

## 2021-12-25 MED ORDER — ALUM & MAG HYDROXIDE-SIMETH 200-200-20 MG/5ML PO SUSP: 30.0000 mL | ORAL | Status: DC | PRN

## 2021-12-25 MED ORDER — WARFARIN SODIUM 3 MG PO TABS
3.0000 mg | ORAL_TABLET | Freq: Once | ORAL | Status: DC
  Filled 2021-12-25: qty 1

## 2021-12-25 MED ORDER — MIRTAZAPINE 15 MG PO TABS
15.0000 mg | ORAL_TABLET | Freq: Every day | ORAL | Status: DC
  Administered 2021-12-25 – 2021-12-26 (×2): 15 mg via ORAL
  Filled 2021-12-25 (×2): qty 1

## 2021-12-25 MED ORDER — WARFARIN SODIUM 2 MG PO TABS
2.0000 mg | ORAL_TABLET | ORAL | Status: DC
  Administered 2021-12-26 – 2021-12-30 (×4): 2 mg via ORAL
  Filled 2021-12-25 (×5): qty 1

## 2021-12-25 MED ORDER — TORSEMIDE 100 MG PO TABS
100.0000 mg | ORAL_TABLET | Freq: Every day | ORAL | Status: DC
  Administered 2021-12-26 – 2022-01-05 (×11): 100 mg via ORAL
  Filled 2021-12-25 (×11): qty 1

## 2021-12-25 MED ORDER — WARFARIN SODIUM 2 MG PO TABS
4.0000 mg | ORAL_TABLET | ORAL | Status: DC
  Administered 2021-12-28 – 2022-01-18 (×4): 4 mg via ORAL
  Filled 2021-12-25 (×4): qty 2

## 2021-12-25 MED ORDER — POTASSIUM CHLORIDE CRYS ER 20 MEQ PO TBCR
40.0000 meq | EXTENDED_RELEASE_TABLET | Freq: Three times a day (TID) | ORAL | Status: DC
  Administered 2021-12-25 – 2022-01-05 (×32): 40 meq via ORAL
  Filled 2021-12-25 (×32): qty 2

## 2021-12-25 MED ORDER — DAPAGLIFLOZIN PROPANEDIOL 10 MG PO TABS
10.0000 mg | ORAL_TABLET | Freq: Every day | ORAL | Status: DC
  Administered 2021-12-26 – 2022-01-02 (×8): 10 mg via ORAL
  Filled 2021-12-25 (×10): qty 1

## 2021-12-25 MED ORDER — TACROLIMUS 0.5 MG PO CAPS
0.5000 mg | ORAL_CAPSULE | ORAL | Status: DC
  Administered 2021-12-26 – 2022-01-21 (×14): 0.5 mg via ORAL
  Filled 2021-12-25 (×14): qty 1

## 2021-12-25 MED ORDER — TRAZODONE HCL 100 MG PO TABS
100.0000 mg | ORAL_TABLET | Freq: Every evening | ORAL | Status: DC | PRN
  Administered 2021-12-27 – 2022-01-04 (×7): 100 mg via ORAL
  Filled 2021-12-25: qty 2
  Filled 2021-12-25 (×7): qty 1
  Filled 2021-12-25: qty 2

## 2021-12-25 MED ORDER — MAGNESIUM HYDROXIDE 400 MG/5ML PO SUSP: 30.0000 mL | Freq: Every day | ORAL | Status: DC | PRN

## 2021-12-25 MED ORDER — ACETAMINOPHEN 325 MG PO TABS
650.0000 mg | ORAL_TABLET | Freq: Four times a day (QID) | ORAL | Status: DC | PRN
  Administered 2022-01-03 – 2022-01-20 (×18): 650 mg via ORAL
  Filled 2021-12-25 (×19): qty 2

## 2021-12-25 MED ORDER — WARFARIN SODIUM 3 MG PO TABS
3.0000 mg | ORAL_TABLET | Freq: Once | ORAL | Status: AC
  Administered 2021-12-25: 3 mg via ORAL
  Filled 2021-12-25: qty 1

## 2021-12-25 MED ORDER — ATORVASTATIN CALCIUM 10 MG PO TABS
20.0000 mg | ORAL_TABLET | Freq: Every day | ORAL | Status: DC
  Administered 2021-12-25 – 2022-01-21 (×28): 20 mg via ORAL
  Filled 2021-12-25 (×29): qty 2

## 2021-12-25 MED ORDER — LORAZEPAM 0.5 MG PO TABS
0.5000 mg | ORAL_TABLET | Freq: Once | ORAL | Status: AC | PRN
  Administered 2021-12-25: 0.5 mg via ORAL
  Filled 2021-12-25: qty 1

## 2021-12-25 MED ORDER — TACROLIMUS 1 MG PO CAPS
1.0000 mg | ORAL_CAPSULE | ORAL | Status: DC
  Administered 2021-12-27 – 2022-01-22 (×14): 1 mg via ORAL
  Filled 2021-12-25 (×14): qty 1

## 2021-12-25 MED ORDER — AMIODARONE HCL 200 MG PO TABS
200.0000 mg | ORAL_TABLET | Freq: Every day | ORAL | Status: DC
  Administered 2021-12-26 – 2022-01-22 (×28): 200 mg via ORAL
  Filled 2021-12-25 (×28): qty 1

## 2021-12-25 MED ORDER — CLONAZEPAM 0.5 MG PO TABS
1.0000 mg | ORAL_TABLET | Freq: Every day | ORAL | Status: DC
  Administered 2021-12-25 – 2021-12-26 (×2): 1 mg via ORAL
  Filled 2021-12-25 (×2): qty 2

## 2021-12-25 MED ORDER — ARIPIPRAZOLE 5 MG PO TABS
5.0000 mg | ORAL_TABLET | Freq: Every day | ORAL | Status: DC
  Administered 2021-12-25 – 2021-12-27 (×3): 5 mg via ORAL
  Filled 2021-12-25 (×3): qty 1

## 2021-12-25 NOTE — ED Notes (Signed)
Pt given breakfast tray and AM meds. Pt c/o anxiety. Pt given Ativan at 1130. Pt c/o continuing anxiety, another Ativan given at 1250. Pt given lunch tray.

## 2021-12-25 NOTE — ED Notes (Signed)
Pt awake at 0825.

## 2021-12-25 NOTE — ED Provider Notes (Signed)
Emergency Medicine Observation Re-evaluation Note  Tavarion Babington is a 71 y.o. male, seen on rounds today.  Pt initially presented to the ED for complaints of Suicidal Currently, the patient is resting in bed without complaint.  Physical Exam  BP (!) 116/56    Pulse 76    Temp 97.9 F (36.6 C) (Oral)    Resp 18    Ht 5\' 10"  (1.778 m)    Wt 82.6 kg    SpO2 94%    BMI 26.13 kg/m  Physical Exam General: Resting calmly reports good sleep Cardiac: No murmur Lungs: Clear Psych: Resting without agitation in bed  ED Course / MDM  EKG:EKG Interpretation  Date/Time:  Thursday December 24 2021 22:43:25 EST Ventricular Rate:  89 PR Interval:    QRS Duration: 126 QT Interval:  420 QTC Calculation: 511 R Axis:   87 Text Interpretation: Atrial fibrillation Non-specific intra-ventricular conduction block Minimal voltage criteria for LVH, may be normal variant ( Cornell product ) Possible Inferior infarct , age undetermined Anterolateral infarct , age undetermined Abnormal ECG When compared with ECG of 15-Nov-2021 20:59, No significant change was found Confirmed by Dorie Rank [phone]) on 12/24/2021 11:59:52 PM  I have reviewed the labs performed to date as well as medications administered while in observation.  Recent changes in the last 24 hours include none.  Plan  Current plan is for awaiting psychiatric recommendations.  Kiley Hal Gruenewald is not under involuntary commitment.     Meesha Sek, Gwenyth Allegra, MD 12/25/21 [phone]

## 2021-12-25 NOTE — Progress Notes (Addendum)
Pt was accepted to [facility] 12/25/2021 at 2030; Bed Assignment L35   Pt meets inpatient criteria per Sheran Fava, NP   Attending Physician will be Dr. Louis Meckel   DX:MDD   Report can be called to: - Geropsychiatric unit:  [phone]   Pt can arrive after 1930   Please fax Vol paperwork to [phone] have both before the patient can arrive at [facility].    Care team communication made with the following: Caren Griffins, MD; Alethia Berthold, MD; Jalene Mullet, [facility] disposition coordinator; Ian Malkin, TTS; [place]ge Hy[place], RN; Ignacia Bayley, RN; Sheran Fava, NP, Lenoria Farrier RN; Deno Etienne DO, EDP.

## 2021-12-25 NOTE — BH Assessment (Signed)
Patient has been accepted to [facility]  Accepting physician is Dr. Louis Meckel.  Attending  Physician will be Dr. Louis Meckel.  Patient has been assigned to room L35, by [facility] [place].   Call report to [phone].  Representative/Transfer Coordinator is Dispensing optician Patient pre-admitted by [facility] Patient Access Seth Bake)  [facility] ER Staff Gayland Curry, Psych NP) made aware of acceptance.  Patient bed available after 7:30pm

## 2021-12-25 NOTE — ED Notes (Addendum)
Pt reporting a high level of anxiety. MD notified

## 2021-12-25 NOTE — ED Notes (Signed)
[facility] has been contacted for transfer to [facility] [employer]

## 2021-12-25 NOTE — ED Notes (Signed)
Pt given lunch tray.

## 2021-12-25 NOTE — ED Notes (Signed)
Pt reports anxiety. MD notified. Pt's brother called and updated.
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
## 2021-12-25 NOTE — Progress Notes (Signed)
ANTICOAGULATION CONSULT NOTE - Follow Up  Pharmacy Consult for Warfarin Indication: atrial fibrillation  Allergies  Allergen Reactions   Other Anaphylaxis    Spider venom   Grapefruit Concentrate Other (See Comments)    Pt has been told not to eat grapefruit   Haloperidol Other (See Comments)    Caused the patient to be overly-sedated   Nsaids Other (See Comments)    Cannot take due to liver transplant   Rifampin Other (See Comments)    Caused flushing   Triazolam Other (See Comments)    Caused the patient to be overly-sedated    Patient Measurements: Height: 5\' 10"  (177.8 cm) Weight: 82.6 kg (182 lb 1.6 oz) IBW/kg (Calculated) : 73    Vital Signs: Temp: 98.8 F (37.1 C) (02/17 1234) Temp Source: Oral (02/17 1234) BP: 129/78 (02/17 1234) Pulse Rate: 77 (02/17 1234)  Labs: Recent Labs    12/24/21 0000 12/24/21 1623 12/24/21 1750 12/25/21 0500  HGB  --  15.4  --   --   HCT  --  48.2  --   --   PLT  --  76*  --   --   LABPROT  --   --  21.1* 20.2*  INR 1.9*  --  1.8* 1.7*  CREATININE  --  2.08*  --   --      Estimated Creatinine Clearance: 35.1 mL/min (A) (by C-G formula based on SCr of 2.08 mg/dL (H)).   Medical History: Past Medical History:  Diagnosis Date   Atrial fibrillation ([place]) 08/2009   CAD (coronary artery disease)    s/p CABG -- 1992   Depression    ESRD (end stage renal disease) ([place])    HLD (hyperlipidemia)    HTN (hypertension)    Idiopathic thrombocytopenia (H[facility])     Medications:  PTA:  Warfarin 2mg  daily except 4mg  on Mondays  Assessment: 71 yr male in ED with suicidal ideation. PMH significant for CAD, AFib, thrombocytopenia, HLD, HTN, depression, s/p kidney transplant. INR = 1.8 on admission  INR 1.7, still subtherapeutic this AM Plts 76k - monitor closely No reported bleeding per notes  Goal of Therapy:  INR 2-3 Monitor platelets by anticoagulation protocol: Yes   Plan:  Warfarin 3mg  today Daily INR   Adrian Saran, PharmD, BCPS Secure Chat if ?s 12/25/2021 12:45 PM

## 2021-12-26 ENCOUNTER — Encounter: Payer: Self-pay | Admitting: Family

## 2021-12-26 ENCOUNTER — Other Ambulatory Visit: Payer: Self-pay

## 2021-12-26 DIAGNOSIS — F333 Major depressive disorder, recurrent, severe with psychotic symptoms: Secondary | ICD-10-CM | POA: Diagnosis not present

## 2021-12-26 LAB — PROTIME-INR
INR: 1.8 — ABNORMAL HIGH (ref 0.8–1.2)
Prothrombin Time: 20.8 seconds — ABNORMAL HIGH (ref 11.4–15.2)

## 2021-12-26 MED ORDER — ONDANSETRON HCL 4 MG PO TABS
4.0000 mg | ORAL_TABLET | Freq: Three times a day (TID) | ORAL | Status: DC | PRN
  Administered 2021-12-27: 4 mg via ORAL
  Filled 2021-12-26: qty 1

## 2021-12-26 MED ORDER — HYDROXYZINE HCL 25 MG PO TABS
25.0000 mg | ORAL_TABLET | Freq: Three times a day (TID) | ORAL | Status: DC | PRN
  Administered 2021-12-26 – 2022-01-01 (×10): 25 mg via ORAL
  Filled 2021-12-26 (×11): qty 1
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
## 2021-12-26 NOTE — Tx Team (Signed)
Initial Treatment Plan 12/26/2021 5:35 AM [name] PNS:[id number]    PATIENT STRESSORS: Health problems   Other: loneliness     PATIENT STRENGTHS: Ability for insight  Average or above average intelligence    PATIENT IDENTIFIED PROBLEMS:                      DISCHARGE CRITERIA:  Safe-care adequate arrangements made Verbal commitment to aftercare and medication compliance  PRELIMINARY DISCHARGE PLAN: Outpatient therapy Return to previous living arrangement  PATIENT/FAMILY INVOLVEMENT: This treatment plan has been presented to and reviewed with the patient, [name], and/or family member,.  The patient and family have been given the opportunity to ask questions and make suggestions.  Isabella Bowens, RN 12/26/2021, 5:35 AM

## 2021-12-26 NOTE — Progress Notes (Signed)
Patient admitted from [facility] ED after voicing suicidal thoughts and recent s/s of depression. He is Ox5, cooperative with treatment and pleasant on approach. He is currently denying SI, HI & AVH. He voices that he has no support system and he has been feeling down and lonely. He was just here on the [facility] unit about a month ago for depression .

## 2021-12-26 NOTE — Progress Notes (Signed)
Patient denies SI, HI, and AVH. However, patient says he really needs help and if he does not get it he does not know how he will continue to live. Patient rates depression and anxiety as a 7/10. Patient denies pain or any other physical problem. Patient states that he slept better while in the hospital than he did when he was at home. He said he had a bad night at home and was not able to sleep, which triggered the thoughts of SI that brought him in the hospital. Patient is seen eating breakfast in the dayroom, but began pacing around the unit.   Patient is compliant with scheduled medications. Patient also asked for something to help with anxiety. Vistaril PRN was given. Patient is observed to be interacting appropriately with staff and other patients on the unit. Patient remains safe on the unit at this time.

## 2021-12-27 DIAGNOSIS — F333 Major depressive disorder, recurrent, severe with psychotic symptoms: Secondary | ICD-10-CM | POA: Diagnosis not present

## 2021-12-27 LAB — CBC
HCT: 49.3 % (ref 39.0–52.0)
Hemoglobin: 15.8 g/dL (ref 13.0–17.0)
MCH: 27.4 pg (ref 26.0–34.0)
MCHC: 32 g/dL (ref 30.0–36.0)
MCV: 85.6 fL (ref 80.0–100.0)
Platelets: 73 10*3/uL — ABNORMAL LOW (ref 150–400)
RBC: 5.76 MIL/uL (ref 4.22–5.81)
RDW: 16.1 % — ABNORMAL HIGH (ref 11.5–15.5)
WBC: 4.9 10*3/uL (ref 4.0–10.5)
nRBC: 0 % (ref 0.0–0.2)

## 2021-12-27 LAB — PROTIME-INR
INR: 2.2 — ABNORMAL HIGH (ref 0.8–1.2)
Prothrombin Time: 24.6 seconds — ABNORMAL HIGH (ref 11.4–15.2)

## 2021-12-27 MED ORDER — MIRTAZAPINE 15 MG PO TABS
30.0000 mg | ORAL_TABLET | Freq: Every day | ORAL | Status: DC
Start: 2021-12-27 — End: 2022-01-04
  Administered 2021-12-27 – 2022-01-03 (×8): 30 mg via ORAL
  Filled 2021-12-27 (×8): qty 2

## 2021-12-27 MED ORDER — CLONAZEPAM 0.5 MG PO TABS
1.0000 mg | ORAL_TABLET | Freq: Every day | ORAL | Status: DC
  Administered 2021-12-27 – 2021-12-28 (×2): 1 mg via ORAL
  Filled 2021-12-27 (×2): qty 2

## 2021-12-27 NOTE — Group Note (Signed)
LCSW Group Therapy Note  Group Date: 12/27/2021 Start Time: 0272 End Time: 1400   Type of Therapy and Topic:  Group Therapy - Healthy vs Unhealthy Coping Skills  Participation Level:  Active   Description of Group The focus of this group was to determine what unhealthy coping techniques typically are used by group members and what healthy coping techniques would be helpful in coping with various problems. Patients were guided in becoming aware of the differences between healthy and unhealthy coping techniques. Patients were asked to identify 2-3 healthy coping skills they would like to learn to use more effectively.  Therapeutic Goals Patients learned that coping is what human beings do all day long to deal with various situations in their lives Patients defined and discussed healthy vs unhealthy coping techniques Patients identified their preferred coping techniques and identified whether these were healthy or unhealthy Patients determined 2-3 healthy coping skills they would like to become more familiar with and use more often. Patients provided support and ideas to each other   Summary of Patient Progress:  Patient was present for the entirety of the group session. Patient was an active listener and participated in the topic of discussion, provided helpful advice to others, and added nuance to topic of conversation. Patient shared that he was not able to sleep last night. Patient identified that he has a tendency to "get into a repetition pattern of bad thoughts." Patient identified listening to music and reading scripture as healthy coping skills he has used in the past. Patient shared that he was going outside to get in the sun and going on walks when it was nice out prior to being hospitalized. Patient identified sleep and exercise as points of wellness he would like to focus on. Patient states he plans to join the [facility] after he is discharged.    Therapeutic Modalities Cognitive  Behavioral Therapy Motivational Interviewing  Berniece Salines, Latanya Presser 12/27/2021  2:54 PM

## 2021-12-27 NOTE — Plan of Care (Signed)
Patient is alert and oriented. Denies SI, HI, AVH. Verbalized depression 6/10, anxiety 6/10. Denies pain or discomfort. Ate breakfast in the day room among peers with good appetite. Complained of nausea after breakfast medicated with PRN Zofran. Compliant with all due medications. Remain safe on the unit with Q 15 minute safety check.  Problem: Education: Goal: Knowledge of  General Education information/materials will improve Outcome: Progressing Goal: Emotional status will improve Outcome: Progressing Goal: Mental status will improve Outcome: Progressing Goal: Verbalization of understanding the information provided will improve Outcome: Progressing   Problem: Activity: Goal: Interest or engagement in activities will improve Outcome: Progressing Goal: Sleeping patterns will improve Outcome: Progressing   Problem: Coping: Goal: Ability to verbalize frustrations and anger appropriately will improve Outcome: Progressing Goal: Ability to demonstrate self-control will improve Outcome: Progressing   Problem: Health Behavior/Discharge Planning: Goal: Identification of resources available to assist in meeting health care needs will improve Outcome: Progressing Goal: Compliance with treatment plan for underlying cause of condition will improve Outcome: Progressing   Problem: Physical Regulation: Goal: Ability to maintain clinical measurements within normal limits will improve Outcome: Progressing   Problem: Safety: Goal: Periods of time without injury will increase Outcome: Progressing   Problem: Education: Goal: Utilization of techniques to improve thought processes will improve Outcome: Progressing Goal: Knowledge of the prescribed therapeutic regimen will improve Outcome: Progressing   Problem: Activity: Goal: Interest or engagement in leisure activities will improve Outcome: Progressing Goal: Imbalance in normal sleep/wake cycle will improve Outcome:  Progressing   Problem: Coping: Goal: Coping ability will improve Outcome: Progressing Goal: Will verbalize feelings Outcome: Progressing   Problem: Health Behavior/Discharge Planning: Goal: Ability to make decisions will improve Outcome: Progressing Goal: Compliance with therapeutic regimen will improve Outcome: Progressing   Problem: Role Relationship: Goal: Will demonstrate positive changes in social behaviors and relationships Outcome: Progressing   Problem: Safety: Goal: Ability to disclose and discuss suicidal ideas will improve Outcome: Progressing Goal: Ability to identify and utilize support systems that promote safety will improve Outcome: Progressing   Problem: Self-Concept: Goal: Will verbalize positive feelings about self Outcome: Progressing Goal: Level of anxiety will decrease Outcome: Progressing   Problem: Education: Goal: Ability to make informed decisions regarding treatment will improve Outcome: Progressing   Problem: Coping: Goal: Coping ability will improve Outcome: Progressing   Problem: Health Behavior/Discharge Planning: Goal: Identification of resources available to assist in meeting health care needs will improve Outcome: Progressing   Problem: Medication: Goal: Compliance with prescribed medication regimen will improve Outcome: Progressing   Problem: Self-Concept: Goal: Ability to disclose and discuss suicidal ideas will improve Outcome: Progressing Goal: Will verbalize positive feelings about self Outcome: Progressing   Problem: Education: Goal: Ability to state activities that reduce stress will improve Outcome: Progressing   Problem: Coping: Goal: Ability to identify and develop effective coping behavior will improve Outcome: Progressing   Problem: Self-Concept: Goal: Ability to identify factors that promote anxiety will improve Outcome: Progressing Goal: Level of anxiety will decrease Outcome: Progressing Goal: Ability to  modify response to factors that promote anxiety will improve Outcome: Progressing
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
## 2021-12-27 NOTE — Progress Notes (Signed)
Pt presents as calm and is cooperative. Pt c/o anxiety and depression rated 8/10. Pt denies SI, HI, and AVH. Pt is medication complaint. Pt remains safe on the unit at this time.

## 2021-12-28 ENCOUNTER — Encounter (HOSPITAL_COMMUNITY): Payer: Medicare Other

## 2021-12-28 DIAGNOSIS — F333 Major depressive disorder, recurrent, severe with psychotic symptoms: Secondary | ICD-10-CM | POA: Diagnosis not present

## 2021-12-28 LAB — PROTIME-INR
INR: 2.6 — ABNORMAL HIGH (ref 0.8–1.2)
Prothrombin Time: 27.5 seconds — ABNORMAL HIGH (ref 11.4–15.2)

## 2021-12-28 MED ORDER — ARIPIPRAZOLE 5 MG PO TABS
5.0000 mg | ORAL_TABLET | Freq: Every day | ORAL | Status: DC
Start: 2021-12-29 — End: 2021-12-29
  Administered 2021-12-29: 5 mg via ORAL
  Filled 2021-12-28: qty 1

## 2021-12-28 NOTE — BHH Suicide Risk Assessment (Signed)
[facility] INPATIENT:  Family/Significant Other Suicide Prevention Education  Suicide Prevention Education:  Education Completed; [name], sister-in-law (name of family member/significant other) has been identified by the patient as the family member/significant other with whom the patient will be residing, and identified as the person(s) who will aid the patient in the event of a mental health crisis (suicidal ideations/suicide attempt).  With written consent from the patient, the family member/significant other has been provided the following suicide prevention education, prior to the and/or following the discharge of the patient.  The suicide prevention education provided includes the following: Suicide risk factors Suicide prevention and interventions National Suicide Hotline telephone number [facility] assessment telephone number [facility] Emergency Assistance [place] and/or Residential Mobile Crisis Unit telephone number  Request made of family/significant other to: Remove weapons (e.g., guns, rifles, knives), all items previously/currently identified as safety concern.   Remove drugs/medications (over-the-counter, prescriptions, illicit drugs), all items previously/currently identified as a safety concern.  The family member/significant other verbalizes understanding of the suicide prevention education information provided.  The family member/significant other agrees to remove the items of safety concern listed above.  [name] 12/28/2021, 11:37 AM

## 2021-12-28 NOTE — Progress Notes (Signed)
Recreation Therapy Notes  Date: 12/28/2021  Time: 1:30 pm   Location: Craft room    Behavioral response: Appropriate  Intervention Topic:  Goals      Discussion/Intervention:  Group content on today was focused on goals. Patients described what goals are and how they define goals. Individuals expressed how they go about setting goals and reaching them. The group identified how important goals are and if they make short term goals to reach long term goals. Patients described how many goals they work on at a time and what affects them not reaching their goal. Individuals described how much time they put into planning and obtaining their goals. The group participated in the intervention My Goal Board and made personal goal boards to help them achieve their goal. Clinical Observations/Feedback: Patient came to group and defined goals as plans for the future. Individual was social with peers and staff while participating in the intervention.   Shantea Poulton LRT/CTRS         Otilio Groleau 12/28/2021 2:58 PM

## 2021-12-28 NOTE — Progress Notes (Signed)
Patient is calm and cooperative with assessment. Patient denies SI, HI, and AVH. He rates his depression and anxiety as a 4/10. Patient says that it has improved significantly from over the weekend. Patient also says that last night was the first night he has been able to sleep in a while. Patient is observed to be interacting appropriately with staff and other patients. Patient is compliant with scheduled medications. He remains safe on the unit at this time.

## 2021-12-28 NOTE — [facility] Suicide Risk Assessment (Signed)
[facility] INPATIENT:  Family/Significant Other Suicide Prevention Education  Suicide Prevention Education:  Contact Attempts: Wilmar Prabhakar, sister-in-law (name of family member/significant other) has been identified by the patient as the family member/significant other with whom the patient will be residing, and identified as the person(s) who will aid the patient in the event of a mental health crisis.  With written consent from the patient, two attempts were made to provide suicide prevention education, prior to and/or following the patient's discharge.  We were unsuccessful in providing suicide prevention education.  A suicide education pamphlet was given to the patient to share with family/significant other.  Date and time of first attempt:12/28/21/10:45 AM  Date and time of second attempt: [name] 12/28/2021, 10:44 AM

## 2021-12-28 NOTE — BH IP Treatment Plan (Signed)
Interdisciplinary Treatment and Diagnostic Plan Update ° °12/28/2021 °Time of Session: 10:00AM °[name] °MRN: [id number] ° °Principal Diagnosis: MDD (major depressive disorder), recurrent episode (HCC) ° °Secondary Diagnoses: Principal Problem: °  MDD (major depressive disorder), recurrent episode (HCC) ° ° °Current Medications:  °Current Facility-Administered Medications  °Medication Dose Route Frequency Provider Last Rate Last Admin  ° acetaminophen (TYLENOL) tablet 650 mg  650 mg Oral Q6H PRN Starkes-Perry, Takia S, FNP      ° alum & mag hydroxide-simeth (MAALOX/MYLANTA) 200-200-20 MG/5ML suspension 30 mL  30 mL Oral Q4H PRN Starkes-Perry, Takia S, FNP      ° amiodarone (PACERONE) tablet 200 mg  200 mg Oral Daily Starkes-Perry, Takia S, FNP   200 mg at 12/28/21 0918  ° ARIPiprazole (ABILIFY) tablet 5 mg  5 mg Oral QHS Starkes-Perry, Takia S, FNP   5 mg at 12/27/21 2159  ° atorvastatin (LIPITOR) tablet 20 mg  20 mg Oral QHS Starkes-Perry, Takia S, FNP   20 mg at 12/27/21 2159  ° clonazePAM (KLONOPIN) tablet 1 mg  1 mg Oral QHS He, [name], MD   1 mg at 12/27/21 2159  ° dapagliflozin propanediol (FARXIGA) tablet 10 mg  10 mg Oral Daily Starkes-Perry, Takia S, FNP   10 mg at 12/28/21 0918  ° hydrOXYzine (ATARAX) tablet 25 mg  25 mg Oral TID PRN He, [name], MD   25 mg at 12/27/21 2159  ° magnesium hydroxide (MILK OF MAGNESIA) suspension 30 mL  30 mL Oral Daily PRN Starkes-Perry, Takia S, FNP      ° magnesium oxide (MAG-OX) tablet 400 mg  400 mg Oral BID Starkes-Perry, Takia S, FNP   400 mg at 12/28/21 0918  ° mirtazapine (REMERON) tablet 30 mg  30 mg Oral QHS He, [name], MD   30 mg at 12/27/21 2158  ° ondansetron (ZOFRAN) tablet 4 mg  4 mg Oral Q8H PRN He, [name], MD   4 mg at 12/27/21 0848  ° potassium chloride SA (KLOR-CON M) CR tablet 40 mEq  40 mEq Oral TID Starkes-Perry, Takia S, FNP   40 mEq at 12/28/21 0918  ° tacrolimus (PROGRAF) capsule 0.5 mg  0.5 mg Oral QODAY Starkes-Perry, Takia S, FNP   0.5 mg at 12/28/21  0918  ° tacrolimus (PROGRAF) capsule 1 mg  1 mg Oral QODAY Starkes-Perry, Takia S, FNP   1 mg at 12/27/21 0958  ° torsemide (DEMADEX) tablet 100 mg  100 mg Oral Daily Starkes-Perry, Takia S, FNP   100 mg at 12/28/21 0918  ° traZODone (DESYREL) tablet 100 mg  100 mg Oral QHS PRN Starkes-Perry, Takia S, FNP   100 mg at 12/27/21 2158  ° warfarin (COUMADIN) tablet 2 mg  2 mg Oral Once per day on Sun Tue Wed Thu Fri Sat Herrick, Richard Edward, DO   2 mg at 12/27/21 1757  ° warfarin (COUMADIN) tablet 4 mg  4 mg Oral Q Mon-1800 Starkes-Perry, Takia S, FNP      ° Warfarin - Pharmacist Dosing Inpatient   Does not apply q1600 Starkes-Perry, Takia S, FNP   Given at 12/27/21 1621  ° °PTA Medications: °Medications Prior to Admission  °Medication Sig Dispense Refill Last Dose  ° allopurinol (ZYLOPRIM) 100 MG tablet Take 100 mg by mouth daily.     ° amiodarone (PACERONE) 200 MG tablet Take 1 tablet (200 mg total) by mouth daily. 30 tablet 5   ° ARIPiprazole (ABILIFY) 5 MG tablet Take 1 tablet (5 mg total) by mouth   at bedtime. 30 tablet 2    atorvastatin (LIPITOR) 40 MG tablet Take 1 tablet (40 mg total) by mouth at bedtime. (Patient taking differently: Take 20 mg by mouth at bedtime.) 90 tablet 3    calcitRIOL (ROCALTROL) 0.25 MCG capsule Take 0.25 mcg by mouth daily.      Cholecalciferol (VITAMIN D3) 50 MCG (2000 UT) TABS Take 2,000 Units by mouth daily.      clonazePAM (KLONOPIN) 1 MG tablet Take 1 tablet (1 mg total) by mouth at bedtime. 30 tablet 2    Cyanocobalamin (VITAMIN B12) 1000 MCG TBCR Take 1,000 mcg by mouth daily.      dapagliflozin propanediol (FARXIGA) 10 MG TABS tablet Take 1 tablet (10 mg total) by mouth daily. 30 tablet 6    EPINEPHrine (EPI-PEN) 0.3 mg/0.3 mL DEVI Inject 0.3 mg into the muscle once as needed (severe allergic reaction). (Patient not taking: Reported on 12/24/2021)      ferrous sulfate 325 (65 FE) MG tablet Take 325 mg by mouth daily with breakfast.      fish oil-omega-3 fatty acids 1000  MG capsule Take 1 g by mouth 2 (two) times daily.      MAGNESIUM-OXIDE 400 (240 Mg) MG tablet Take 400 mg by mouth 2 (two) times daily.      mirtazapine (REMERON) 15 MG tablet Take 1 tablet (15 mg total) by mouth at bedtime. 30 tablet 2    nitroGLYCERIN (NITROSTAT) 0.4 MG SL tablet Place 0.4 mg under the tongue every 5 (five) minutes as needed for chest pain. (Patient not taking: Reported on 12/24/2021)      potassium chloride SA (KLOR-CON) 20 MEQ tablet Take 2 tablets (40 mEq total) by mouth 3 (three) times daily. 90 tablet 6    tacrolimus (PROGRAF) 0.5 MG capsule Take 0.5 mg by mouth See admin instructions. Takes 0.5 mg  tablet by mouth one day and 1 mg tablets next day (1 and 2 tablets on alternate days)      torsemide (DEMADEX) 20 MG tablet Take 5 tablets (100 mg total) by mouth daily. 210 tablet 6    traZODone (DESYREL) 100 MG tablet Take 1 tablet (100 mg total) by mouth at bedtime as needed for sleep. 30 tablet 2    warfarin (COUMADIN) 4 MG tablet TAKE AS DIRECTED BY COUMADIN CLINIC (Patient taking differently: Take 2-4 mg by mouth See admin instructions. Take 40m by mouth on Monday, then take 261m(1/2 tablet) all other days) 65 tablet 1     Patient Stressors: Health problems   Other: loneliness    Patient Strengths: Ability for insight  Average or above average intelligence   Treatment Modalities: Medication Management, Group therapy, Case management,  1 to 1 session with clinician, Psychoeducation, Recreational therapy.   Physician Treatment Plan for Primary Diagnosis: MDD (major depressive disorder), [place] episode [place] Improvement in symptoms so as ready for discharge   Short Term Goals: Ability to identify changes in lifestyle to reduce recurrence of condition will improve Ability to verbalize feelings will improve Ability to disclose and discuss suicidal ideas Ability to demonstrate self-control will improve Ability to identify and develop effective  coping behaviors will improve Ability to maintain clinical measurements within normal limits will improve Compliance with prescribed medications will improve Ability to identify triggers associated with substance abuse/mental health issues will improve  Medication Management: Evaluate patient's response, side effects, and tolerance of medication regimen.  Therapeutic Interventions: 1 to 1 sessions, Unit Group sessions and Medication  administration.  Evaluation of Outcomes: Not Met  Physician Treatment Plan for Secondary Diagnosis: Principal Problem:   MDD (major depressive disorder), recurrent episode ([place])  Long Term Goal(s): Improvement in symptoms so as ready for discharge   Short Term Goals: Ability to identify changes in lifestyle to reduce recurrence of condition will improve Ability to verbalize feelings will improve Ability to disclose and discuss suicidal ideas Ability to demonstrate self-control will improve Ability to identify and develop effective coping behaviors will improve Ability to maintain clinical measurements within normal limits will improve Compliance with prescribed medications will improve Ability to identify triggers associated with substance abuse/mental health issues will improve     Medication Management: Evaluate patient's response, side effects, and tolerance of medication regimen.  Therapeutic Interventions: 1 to 1 sessions, Unit Group sessions and Medication administration.  Evaluation of Outcomes: Not Met   RN Treatment Plan for Primary Diagnosis: MDD (major depressive disorder), recurrent episode ([place]) Long Term Goal(s): Knowledge of disease and therapeutic regimen to maintain health will improve  Short Term Goals: Ability to remain free from injury will improve, Ability to verbalize frustration and anger appropriately will improve, Ability to demonstrate self-control, Ability to participate in decision making will improve, Ability to verbalize  feelings will improve, Ability to disclose and discuss suicidal ideas, Ability to identify and develop effective coping behaviors will improve, and Compliance with prescribed medications will improve  Medication Management: RN will administer medications as ordered by provider, will assess and evaluate patient's response and provide education to patient for prescribed medication. RN will report any adverse and/or side effects to prescribing provider.  Therapeutic Interventions: 1 on 1 counseling sessions, Psychoeducation, Medication administration, Evaluate responses to treatment, Monitor vital signs and CBGs as ordered, Perform/monitor CIWA, COWS, AIMS and Fall Risk screenings as ordered, Perform wound care treatments as ordered.  Evaluation of Outcomes: Not Met   LCSW Treatment Plan for Primary Diagnosis: MDD (major depressive disorder), recurrent episode ([place]) Long Term Goal(s): Safe transition to appropriate next level of care at discharge, Engage patient in therapeutic group addressing interpersonal concerns.  Short Term Goals: Engage patient in aftercare planning with referrals and resources, Increase social support, Increase ability to appropriately verbalize feelings, Increase emotional regulation, Facilitate acceptance of mental health diagnosis and concerns, and Identify triggers associated with mental health/substance abuse issues  Therapeutic Interventions: Assess for all discharge needs, 1 to 1 time with Social worker, Explore available resources and support systems, Assess for adequacy in community support network, Educate family and significant other(s) on suicide prevention, Complete Psychosocial Assessment, Interpersonal group therapy.  Evaluation of Outcomes: Not Met   Progress in Treatment: Attending groups: Yes. Participating in groups: Yes. Taking medication as prescribed: Yes. Toleration medication: Yes. Family/Significant other contact made: No, will contact:  when  given permission Patient understands diagnosis: Yes. Discussing patient identified problems/goals with staff: Yes. Medical problems stabilized or resolved: Yes. Denies suicidal/homicidal ideation: No. Issues/concerns per patient self-inventory: No. Other: None  New problem(s) identified: No, Describe:  None  New Short Term/Long Term Goal(s): Patient to work towards medication management for mood stabilization; elimination of SI thoughts; development of comprehensive mental wellness plan.   Patient Goals:  Patient stated that he wants more sleep and work to work on his depression and eliminating his SI thoughts.   Discharge Plan or Barriers:  CSW will assist pt in development of an appropriate discharge/aftercare plan.   Reason for Continuation of Hospitalization: Anxiety Depression Medication stabilization Suicidal ideation  Estimated Length of Stay: TBD  Scribe for Treatment Team: ° A , LCSWA °12/28/2021 °10:11 AM °

## 2021-12-28 NOTE — Progress Notes (Signed)
Patient is seen in the dayroom calm. Pt c/o anxiety and depression 6/10. Pt denies SI, HI, and AVH. Pt remains safe on the unit at this time.
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
## 2021-12-28 NOTE — Progress Notes (Signed)
Recreation Therapy Notes  INPATIENT RECREATION THERAPY ASSESSMENT  Patient Details Name: [name] MRN: [id number] DOB: 05-30-1951 Today's Date: 12/28/2021       Information Obtained From: Patient  Able to Participate in Assessment/Interview: Yes  Patient Presentation: Responsive  Reason for Admission (Per Patient): Active Symptoms  Patient Stressors:    Coping Skills:   Exercise, Deep Breathing  Leisure Interests (2+):  Exercise - Walking, Consulting civil engineer (Comment), Nature - Recruitment consultant, Social - Friends  Frequency of Recreation/Participation: Financial risk analyst Resources:  Yes  Community Resources:  [employer], [employer], Art therapist  Current Use: Yes  If no, Barriers?:    Expressed Interest in [place]: Yes  [place]idence[place]  Patient Main Form of Transportation: Musician  Patient Strengths:  Finish what I start,good personality  Patient Identified Areas of Improvement:  Improve myself  Patient Goal for Hospitalization:  Better look on life  Current SI (including self-harm):  No  Current HI:  No  Current AVH: No  Staff Intervention Plan: Group Attendance, Collaborate with Interdisciplinary Treatment Team  Consent to Intern Participation: N/A  [name] 12/28/2021, 3:25 PM

## 2021-12-28 NOTE — Progress Notes (Signed)
Recreation Therapy Notes  INPATIENT RECREATION TR PLAN  Patient Details Name: [name] MRN: [id number] DOB: 07-23-1951 Today's Date: 12/28/2021  Rec Therapy Plan Is patient appropriate for Therapeutic Recreation?: Yes Treatment times per week: at least 3 Estimated Length of Stay: 5-7 days TR Treatment/Interventions: Group participation (Comment)  Discharge Criteria Pt will be discharged from therapy if:: Discharged Treatment plan/goals/alternatives discussed and agreed upon by:: Patient/family  Discharge Summary     [name] 12/28/2021, 3:26 PM

## 2021-12-29 ENCOUNTER — Encounter (HOSPITAL_COMMUNITY): Payer: Self-pay

## 2021-12-29 ENCOUNTER — Encounter (HOSPITAL_COMMUNITY): Payer: Medicare Other

## 2021-12-29 DIAGNOSIS — F333 Major depressive disorder, recurrent, severe with psychotic symptoms: Secondary | ICD-10-CM | POA: Diagnosis not present

## 2021-12-29 LAB — PROTIME-INR
INR: 2.6 — ABNORMAL HIGH (ref 0.8–1.2)
Prothrombin Time: 27.7 seconds — ABNORMAL HIGH (ref 11.4–15.2)

## 2021-12-29 MED ORDER — QUETIAPINE FUMARATE 100 MG PO TABS
100.0000 mg | ORAL_TABLET | Freq: Every day | ORAL | Status: DC
  Administered 2021-12-29: 100 mg via ORAL
  Filled 2021-12-29: qty 1

## 2021-12-29 MED ORDER — TEMAZEPAM 15 MG PO CAPS
15.0000 mg | ORAL_CAPSULE | Freq: Every day | ORAL | Status: DC
  Administered 2021-12-29 – 2021-12-31 (×3): 15 mg via ORAL
  Filled 2021-12-29 (×3): qty 1

## 2021-12-29 MED ORDER — ARIPIPRAZOLE 5 MG PO TABS
10.0000 mg | ORAL_TABLET | Freq: Every day | ORAL | Status: DC
  Administered 2021-12-30 – 2022-01-01 (×3): 10 mg via ORAL
  Filled 2021-12-29 (×3): qty 2

## 2021-12-29 NOTE — Plan of Care (Signed)
Patient is alert and oriented, calm and cooperative. Patient verbalized depression of 8/10 and anxiety of 9/10. Denies pain or discomfort at this time. Denies SI, HI, AVH. Report being awake most of the night.  Patient stated " I just don't feel right, I don't know what to do".  Emotional support and reassurance provided. Ate breakfast in the day room among peers with good appetite. Compliant with medications. Patient is currently in the day room among peers watching television. Remain safe on the unit with Q 15 minute safety checks.  Problem: Education: Goal: Knowledge of [facility] Education information/materials will improve Outcome: Progressing Goal: Emotional status will improve Outcome: Progressing Goal: Mental status will improve Outcome: Progressing Goal: Verbalization of understanding the information provided will improve Outcome: Progressing   Problem: Activity: Goal: Interest or engagement in activities will improve Outcome: Progressing Goal: Sleeping patterns will improve Outcome: Progressing   Problem: Coping: Goal: Ability to verbalize frustrations and anger appropriately will improve Outcome: Progressing Goal: Ability to demonstrate self-control will improve Outcome: Progressing   Problem: Health Behavior/Discharge Planning: Goal: Identification of resources available to assist in meeting health care needs will improve Outcome: Progressing Goal: Compliance with treatment plan for underlying cause of condition will improve Outcome: Progressing   Problem: Physical Regulation: Goal: Ability to maintain clinical measurements within normal limits will improve Outcome: Progressing   Problem: Safety: Goal: Periods of time without injury will increase Outcome: Progressing   Problem: Education: Goal: Utilization of techniques to improve thought processes will improve Outcome: Progressing Goal: Knowledge of the prescribed therapeutic regimen will  improve Outcome: Progressing   Problem: Activity: Goal: Interest or engagement in leisure activities will improve Outcome: Progressing Goal: Imbalance in normal sleep/wake cycle will improve Outcome: Progressing   Problem: Coping: Goal: Coping ability will improve Outcome: Progressing Goal: Will verbalize feelings Outcome: Progressing   Problem: Health Behavior/Discharge Planning: Goal: Ability to make decisions will improve Outcome: Progressing Goal: Compliance with therapeutic regimen will improve Outcome: Progressing   Problem: Role Relationship: Goal: Will demonstrate positive changes in social behaviors and relationships Outcome: Progressing   Problem: Safety: Goal: Ability to disclose and discuss suicidal ideas will improve Outcome: Progressing Goal: Ability to identify and utilize support systems that promote safety will improve Outcome: Progressing   Problem: Self-Concept: Goal: Will verbalize positive feelings about self Outcome: Progressing Goal: Level of anxiety will decrease Outcome: Progressing   Problem: Education: Goal: Ability to make informed decisions regarding treatment will improve Outcome: Progressing   Problem: Coping: Goal: Coping ability will improve Outcome: Progressing   Problem: Health Behavior/Discharge Planning: Goal: Identification of resources available to assist in meeting health care needs will improve Outcome: Progressing   Problem: Medication: Goal: Compliance with prescribed medication regimen will improve Outcome: Progressing   Problem: Self-Concept: Goal: Ability to disclose and discuss suicidal ideas will improve Outcome: Progressing Goal: Will verbalize positive feelings about self Outcome: Progressing   Problem: Education: Goal: Ability to state activities that reduce stress will improve Outcome: Progressing   Problem: Coping: Goal: Ability to identify and develop effective coping behavior will improve Outcome:  Progressing   Problem: Self-Concept: Goal: Ability to identify factors that promote anxiety will improve Outcome: Progressing Goal: Level of anxiety will decrease Outcome: Progressing Goal: Ability to modify response to factors that promote anxiety will improve Outcome: Progressing
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
## 2021-12-29 NOTE — Progress Notes (Signed)
Recreation Therapy Notes    Date: 12/29/2021   Time: 1:20 pm     Location:  [facility]ourtyard    Behavioral response: Appropriate   Intervention Topic:  Social skills    Discussion/Intervention:  Group content on today was focused on social skills. The group defined social skills and identified ways they use social skills. Patients expressed what obstacles they face when trying to be social. Participants described the importance of social skills. The group listed ways to improve social skills and reasons to improve social skills. Individuals had an opportunity to learn new and improve social skills as well as identify their weaknesses. Clinical Observations/Feedback: Patient came to group and was focused on what peers and staff had to say about social skills. Individual participated in the intervention and was social with peers and staff.  Rebakah Cokley LRT/CTRS         Daveion Robar 12/29/2021 2:59 PM

## 2021-12-29 NOTE — Progress Notes (Addendum)
Patient alert and oriented x 4, affect is flat , thoughts are organized and coherent, he denies pain and discomfort but endorses's insomnia was given Trazodone PRN as ordered. Patient denies SI/HI/AVH receptive to staff, 15 minutes safety checks maintained will continue to monitor

## 2021-12-30 DIAGNOSIS — F333 Major depressive disorder, recurrent, severe with psychotic symptoms: Secondary | ICD-10-CM | POA: Diagnosis not present

## 2021-12-30 LAB — PROTIME-INR
INR: 3 — ABNORMAL HIGH (ref 0.8–1.2)
Prothrombin Time: 31.5 seconds — ABNORMAL HIGH (ref 11.4–15.2)

## 2021-12-30 MED ORDER — WARFARIN - PHYSICIAN DOSING INPATIENT
Freq: Every day | Status: DC
  Administered 2022-01-12 – 2022-01-13 (×2): 1

## 2021-12-30 MED ORDER — QUETIAPINE FUMARATE 25 MG PO TABS
50.0000 mg | ORAL_TABLET | Freq: Three times a day (TID) | ORAL | Status: DC
  Administered 2021-12-30 – 2021-12-31 (×3): 50 mg via ORAL
  Filled 2021-12-30 (×3): qty 2

## 2021-12-30 MED ORDER — CHLORPROMAZINE HCL 50 MG PO TABS
50.0000 mg | ORAL_TABLET | Freq: Every day | ORAL | Status: DC
  Administered 2021-12-30: 50 mg via ORAL
  Filled 2021-12-30 (×2): qty 1

## 2021-12-30 NOTE — Plan of Care (Signed)
Patient remain alert and oriented. Calm and cooperative. Denies pain. Verbalized anxiety of 8/10 and depression 5/10. Denies SI, HI, AVH. Report sleeping some last night. Compliant with medications. Ate breakfast in the day room among peers with good appetite. Remain safe on the unit with Q 15 minute safety checks.  Problem: Education: Goal: Knowledge of [facility] Education information/materials will improve Outcome: Progressing Goal: Emotional status will improve Outcome: Progressing Goal: Mental status will improve Outcome: Progressing Goal: Verbalization of understanding the information provided will improve Outcome: Progressing   Problem: Activity: Goal: Interest or engagement in activities will improve Outcome: Progressing Goal: Sleeping patterns will improve Outcome: Progressing   Problem: Coping: Goal: Ability to verbalize frustrations and anger appropriately will improve Outcome: Progressing Goal: Ability to demonstrate self-control will improve Outcome: Progressing   Problem: Health Behavior/Discharge Planning: Goal: Identification of resources available to assist in meeting health care needs will improve Outcome: Progressing Goal: Compliance with treatment plan for underlying cause of condition will improve Outcome: Progressing   Problem: Physical Regulation: Goal: Ability to maintain clinical measurements within normal limits will improve Outcome: Progressing   Problem: Safety: Goal: Periods of time without injury will increase Outcome: Progressing   Problem: Education: Goal: Utilization of techniques to improve thought processes will improve Outcome: Progressing Goal: Knowledge of the prescribed therapeutic regimen will improve Outcome: Progressing   Problem: Activity: Goal: Interest or engagement in leisure activities will improve Outcome: Progressing Goal: Imbalance in normal sleep/wake cycle will improve Outcome: Progressing   Problem:  Coping: Goal: Coping ability will improve Outcome: Progressing Goal: Will verbalize feelings Outcome: Progressing   Problem: Health Behavior/Discharge Planning: Goal: Ability to make decisions will improve Outcome: Progressing Goal: Compliance with therapeutic regimen will improve Outcome: Progressing   Problem: Role Relationship: Goal: Will demonstrate positive changes in social behaviors and relationships Outcome: Progressing   Problem: Safety: Goal: Ability to disclose and discuss suicidal ideas will improve Outcome: Progressing Goal: Ability to identify and utilize support systems that promote safety will improve Outcome: Progressing   Problem: Self-Concept: Goal: Will verbalize positive feelings about self Outcome: Progressing Goal: Level of anxiety will decrease Outcome: Progressing   Problem: Education: Goal: Ability to make informed decisions regarding treatment will improve Outcome: Progressing   Problem: Coping: Goal: Coping ability will improve Outcome: Progressing   Problem: Health Behavior/Discharge Planning: Goal: Identification of resources available to assist in meeting health care needs will improve Outcome: Progressing   Problem: Medication: Goal: Compliance with prescribed medication regimen will improve Outcome: Progressing   Problem: Self-Concept: Goal: Ability to disclose and discuss suicidal ideas will improve Outcome: Progressing Goal: Will verbalize positive feelings about self Outcome: Progressing   Problem: Education: Goal: Ability to state activities that reduce stress will improve Outcome: Progressing   Problem: Coping: Goal: Ability to identify and develop effective coping behavior will improve Outcome: Progressing   Problem: Self-Concept: Goal: Ability to identify factors that promote anxiety will improve Outcome: Progressing Goal: Level of anxiety will decrease Outcome: Progressing Goal: Ability to modify response to  factors that promote anxiety will improve Outcome: Progressing

## 2021-12-30 NOTE — Progress Notes (Signed)
Recreation Therapy Notes  Date: 12/30/2021   Time: 1:30 pm  Location: Courtyard     Behavioral response: Appropriate   Intervention Topic:  Leisure    Discussion/Intervention:  Group content today was focused on leisure. The group defined what leisure is and some positive leisure activities they participate in. Individuals identified the difference between good and bad leisure. Participants expressed how they feel after participating in the leisure of their choice. The group discussed how they go about picking a leisure activity and if others are involved in their leisure activities. The patient stated how many leisure activities they have to choose from and reasons why it is important to have leisure time. Individuals participated in the intervention Exploration of Leisure where they had a chance to identify new leisure activities as well as benefits of leisure. Clinical Observations/Feedback: Patient came to group and was focused on what peers and staff had to say about leisure. He identified walking as a leisure activity he enjoys. Individual was social with peers and staff while participating in the intervention.   Maile Linford LRT/CTRS           Hanaan Gancarz 12/30/2021 2:13 PM

## 2021-12-30 NOTE — Progress Notes (Signed)
Patient's condition is unchanged no distress noted,interacting appropriately with peers and staff, he denies SI/HI/AVH, compliant with medication regimen, 15 minutes safety checks maintained will continue to monitor.

## 2021-12-30 NOTE — Progress Notes (Signed)
Patient approaches nurses station and stated " I'm falling to pieces, I can't get a hold of myself", I feel like I'm having a nervous breakdown". Verbalized anxiety of 10/10 MD notified, medicated with PRN vistaril. Emotional support and reassurance provided. Patient is currently in his room in bed resting in no apparent distress at this time. Remain safe with Q 15 minute safety checks.

## 2021-12-30 NOTE — Progress Notes (Signed)
[facility] MD Progress Note  12/30/2021 11:00 AM [name]  MRN:  [id number] Subjective: [name] states that he did not sleep last night.  Nurses note the night before that he was up.  I do not know about last night.  Regardless, that is his main concern and always has been.  I told him he needs a sleep study.  In the meantime I told him I would make some changes because he is depressed.  Principal Problem: MDD (major depressive disorder), recurrent episode ([place]) Diagnosis: Principal Problem:   MDD (major depressive disorder), recurrent episode ([place])  Total Time spent with patient: 15 minutes  Past Psychiatric History: [facility] in [place]  Past Medical History:  Past Medical History:  Diagnosis Date   Atrial fibrillation ([place]) 08/2009   CAD (coronary artery disease)    s/p CABG -- 1992   Depression    ESRD (end stage renal disease) ([place])    HLD (hyperlipidemia)    HTN (hypertension)    Idiopathic thrombocytopenia (HCC)     Past Surgical History:  Procedure Laterality Date   APPENDECTOMY     CORONARY ARTERY BYPASS GRAFT     FACIAL COSMETIC SURGERY     GALLBLADDER SURGERY     HERNIA REPAIR     KIDNEY TRANSPLANT     LEFT HEART CATH AND CORONARY ANGIOGRAPHY N/A 08/07/2018   Procedure: LEFT HEART CATH AND CORONARY ANGIOGRAPHY;  Surgeon: Sherren Mocha, MD;  Location: [place] [facility];  Service: Cardiovascular;  Laterality: N/A;   LIVER TRANSPLANT     PRESSURE SENSOR/CARDIOMEMS N/A 08/04/2021   Procedure: PRESSURE SENSOR/CARDIOMEMS;  Surgeon: Larey [place], MD;  Location: [place] [facility];  Service: Cardiovascular;  Laterality: N/A;   RIGHT HEART CATH N/A 06/25/2021   Procedure: RIGHT HEART CATH;  Surgeon: Larey [place], MD;  Location: [place] [facility];  Service: Cardiovascular;  Laterality: N/A;   Family History:  Family History  Problem Relation Age of Onset   Atrial fibrillation Mother    Breast cancer Mother    Renal cancer Father    Hypertension Brother     Hyperlipidemia Brother    Other Brother        stent    Renal Disease Maternal Grandmother    Stroke Maternal Grandmother    Heart attack Maternal Grandfather    Cancer Paternal Grandmother    Heart failure Paternal Grandfather    Emphysema Paternal Grandfather    Bronchiolitis Paternal Grandfather    Social History:  Social History   Substance and Sexual Activity  Alcohol Use No     Social History   Substance and Sexual Activity  Drug Use No    Social History   Socioeconomic History   Marital status: Single    Spouse name: Not on file   Number of children: 0   Years of education: Not on file   Highest education level: Not on file  Occupational History   Occupation: retired  Tobacco Use   Smoking status: Never   Smokeless tobacco: Never  Vaping Use   Vaping Use: Never used  Substance and Sexual Activity   Alcohol use: No   Drug use: No   Sexual activity: Not on file  Other Topics Concern   Not on file  Social History Narrative   Not on file   Social Determinants of Health   Financial Resource Strain: Low Risk    Difficulty of Paying Living Expenses: Not very hard  Food Insecurity: No Food  Insecurity   Worried About Charity fundraiser in the Last Year: Never true   [place] in the Last Year: Never true  Transportation Needs: No Transportation Needs   Lack of Transportation (Medical): No   Lack of Transportation (Non-Medical): No  Physical Activity: Not on file  Stress: Not on file  Social Connections: Not on file   Additional Social History:                         Sleep: Poor  Appetite:  Fair  Current Medications: Current Facility-Administered Medications  Medication Dose Route Frequency Provider Last Rate Last Admin   acetaminophen (TYLENOL) tablet 650 mg  650 mg Oral Q6H PRN Starkes-Perry, Gayland Curry, FNP       alum & mag hydroxide-simeth (MAALOX/MYLANTA) 200-200-20 MG/5ML suspension 30 mL  30 mL Oral Q4H PRN Starkes-Perry, Gayland Curry, FNP       amiodarone (PACERONE) tablet 200 mg  200 mg Oral Daily Suella Broad, FNP   200 mg at 12/30/21 0919   ARIPiprazole (ABILIFY) tablet 10 mg  10 mg Oral Daily Parks Ranger, DO   10 mg at 12/30/21 0919   atorvastatin (LIPITOR) tablet 20 mg  20 mg Oral QHS Suella Broad, FNP   20 mg at 12/29/21 2145   chlorproMAZINE (THORAZINE) tablet 50 mg  50 mg Oral QHS Parks Ranger, DO       dapagliflozin propanediol (FARXIGA) tablet 10 mg  10 mg Oral Daily Suella Broad, FNP   10 mg at 12/30/21 1191   hydrOXYzine (ATARAX) tablet 25 mg  25 mg Oral TID PRN He, [name], MD   25 mg at 12/29/21 2147   magnesium hydroxide (MILK OF MAGNESIA) suspension 30 mL  30 mL Oral Daily PRN Suella Broad, FNP       magnesium oxide (MAG-OX) tablet 400 mg  400 mg Oral BID Suella Broad, FNP   400 mg at 12/30/21 0919   mirtazapine (REMERON) tablet 30 mg  30 mg Oral QHS He, [name], MD   30 mg at 12/29/21 2146   ondansetron (ZOFRAN) tablet 4 mg  4 mg Oral Q8H PRN He, [name], MD   4 mg at 12/27/21 0848   potassium chloride SA (KLOR-CON M) CR tablet 40 mEq  40 mEq Oral TID Suella Broad, FNP   40 mEq at 12/30/21 0919   tacrolimus (PROGRAF) capsule 0.5 mg  0.5 mg Oral [name], FNP   0.5 mg at 12/30/21 0919   tacrolimus (PROGRAF) capsule 1 mg  1 mg Oral [name], FNP   1 mg at 12/29/21 0917   temazepam (RESTORIL) capsule 15 mg  15 mg Oral QHS Parks Ranger, DO   15 mg at 12/29/21 2145   torsemide (DEMADEX) tablet 100 mg  100 mg Oral Daily Suella Broad, FNP   100 mg at 12/30/21 0919   traZODone (DESYREL) tablet 100 mg  100 mg Oral QHS PRN Suella Broad, FNP   100 mg at 12/29/21 2151   warfarin (COUMADIN) tablet 2 mg  2 mg Oral Once per day on Sun Tue Wed Thu Fri Sat Parks Ranger, DO   2 mg at 12/29/21 4782   warfarin (COUMADIN) tablet 4 mg  4 mg Oral Q Mon-1800 Suella Broad,  FNP   4 mg at 12/28/21 1701   Warfarin - Pharmacist Dosing Inpatient  Does not apply q1600 Suella Broad, FNP   Given at 12/29/21 1627    Lab Results:  Results for orders placed or performed during the hospital encounter of 12/25/21 (from the past 48 hour(s))  Protime-INR     Status: Abnormal   Collection Time: 12/29/21  6:14 AM  Result Value Ref Range   Prothrombin Time 27.7 (H) 11.4 - 15.2 seconds   INR 2.6 (H) 0.8 - 1.2    Comment: (NOTE) INR goal varies based on device and disease states. Performed at [facility], [address] [place], [place] [address]   Protime-INR     Status: Abnormal   Collection Time: 12/30/21  6:09 AM  Result Value Ref Range   Prothrombin Time 31.5 (H) 11.4 - 15.2 seconds   INR 3.0 (H) 0.8 - 1.2    Comment: (NOTE) INR goal varies based on device and disease states. Performed at [facility], [address] [place],  [address]     Blood Alcohol level:  Lab Results  Component Value Date   [facility] <10 12/24/2021   ETH <10 69/48/5462    Metabolic Disorder Labs: Lab Results  Component Value Date   HGBA1C 5.2 11/17/2021   MPG 102.54 11/17/2021   MPG 105.41 08/05/2018   No results found for: PROLACTIN Lab Results  Component Value Date   CHOL 117 11/17/2021   TRIG 96 11/17/2021   HDL 28 (L) 11/17/2021   CHOLHDL 4.2 11/17/2021   VLDL 19 11/17/2021   LDLCALC 70 11/17/2021   LDLCALC 87 08/22/2019    Physical Findings: AIMS:  , ,  ,  ,    CIWA:    COWS:     Musculoskeletal: Strength & Muscle Tone: within normal limits Gait & Station: normal Patient leans: N/A  Psychiatric Specialty Exam:  Presentation  General Appearance: No data recorded Eye Contact:No data recorded Speech:No data recorded Speech Volume:No data recorded Handedness:No data recorded  Mood and Affect  Mood:No data recorded Affect:No data recorded  Thought Process  Thought Processes:No data recorded Descriptions of  Associations:No data recorded Orientation:No data recorded Thought Content:No data recorded History of Schizophrenia/Schizoaffective disorder:No  Duration of Psychotic Symptoms:No data recorded Hallucinations:No data recorded Ideas of Reference:No data recorded Suicidal Thoughts:No data recorded Homicidal Thoughts:No data recorded  Sensorium  Memory:No data recorded Judgment:No data recorded Insight:No data recorded  Executive Functions  Concentration:No data recorded Attention Span:No data recorded Recall:No data recorded Fund of Knowledge:No data recorded Language:No data recorded  Psychomotor Activity  Psychomotor Activity:No data recorded  Assets  Assets:No data recorded  Sleep  Sleep:No data recorded   Physical Exam: Physical Exam Vitals and nursing note reviewed.  Constitutional:      Appearance: Normal appearance. He is normal weight.  Neurological:     General: No focal deficit present.     Mental Status: He is alert and oriented to person, place, and time.  Psychiatric:        Attention and Perception: Attention and perception normal.        Mood and Affect: Mood is depressed. Affect is flat.        Speech: Speech normal.        Behavior: Behavior normal. Behavior is cooperative.        Thought Content: Thought content includes suicidal ideation.        Cognition and Memory: Cognition and memory normal.        Judgment: Judgment normal.   Review of Systems  Constitutional: Negative.  HENT: Negative.    Eyes: Negative.   Respiratory: Negative.    Cardiovascular: Negative.   Gastrointestinal: Negative.   Genitourinary: Negative.   Musculoskeletal: Negative.   Skin: Negative.   Neurological: Negative.   Endo/Heme/Allergies: Negative.   Psychiatric/Behavioral:  Positive for depression. The patient has insomnia.   Blood pressure 96/66, pulse 89, temperature (!) 97.5 F (36.4 C), resp. rate 20, height 5\' 10"  (1.778 m), weight 81.2 kg, SpO2 94 %.  Body mass index is 25.69 kg/m.   Treatment Plan Summary: Daily contact with patient to assess and evaluate symptoms and progress in treatment, Medication management, and Plan discontinue Seroquel and start Thorazine 50 mg at bedtime.  Discontinue , continue with Abilify and Remeron.  [place], DO 12/30/2021, 11:00 AM

## 2021-12-30 NOTE — Progress Notes (Addendum)
Pt seen by talking on the phone at the beginning of the shift. Pt later went to the day room and watched TV for sometime.  Pt then went to his room and laid on the bed resting quietly. Pt reports that he does not feel good, but glad he is here. Pt reports that he is feeling anxious and rates his anxiety 9/10. Pt reports some feeling of depression and rates it 6/10. Pt denies SI/HI/AVH. Pt reports that he will be out for snacks and his medications later. Q 15 mins safety checks maintained.  Emotional support given.

## 2021-12-30 NOTE — Progress Notes (Signed)
Pt out in the dayroom watching TV. Q15 mins safety checks maintained.

## 2021-12-31 DIAGNOSIS — F333 Major depressive disorder, recurrent, severe with psychotic symptoms: Secondary | ICD-10-CM | POA: Diagnosis not present

## 2021-12-31 LAB — PROTIME-INR
INR: 3.2 — ABNORMAL HIGH (ref 0.8–1.2)
Prothrombin Time: 33 seconds — ABNORMAL HIGH (ref 11.4–15.2)

## 2021-12-31 MED ORDER — RAMELTEON 8 MG PO TABS
8.0000 mg | ORAL_TABLET | Freq: Every day | ORAL | Status: DC
  Administered 2021-12-31 – 2022-01-03 (×4): 8 mg via ORAL
  Filled 2021-12-31 (×4): qty 1

## 2021-12-31 MED ORDER — WARFARIN SODIUM 2 MG PO TABS
2.0000 mg | ORAL_TABLET | ORAL | Status: DC
  Administered 2022-01-02 – 2022-01-03 (×2): 2 mg via ORAL
  Filled 2021-12-31 (×4): qty 1

## 2021-12-31 NOTE — Progress Notes (Signed)
Pt. participated in group held by MHT. Pt actively engaged in discussion and remained for full group.

## 2021-12-31 NOTE — Plan of Care (Signed)
°  Problem: Education: Emotional support given. Goal: Emotional status will improve Outcome: Progressing   Problem: Education: Emotional support given. Goal: Mental status will improve Outcome: Progressing

## 2021-12-31 NOTE — Progress Notes (Addendum)
Patient is assessed at bedside. Patient states that he had a bad night. He was tossing and turning all night. He asked for PRNs to help him sleep in the middle of the night. He said that he fell asleep briefly afterwards, but had a bad dream about a friend's son being in a car wreck. After that, he said that he has been having a bad feeling he cannot shake off. Patient denies SI, HI, and AVH, but rates anxiety as a 10/10. Patient was offered PRN medication for anxiety, but patient stated that it may make him too sleepy. Patient also reports that he has had a poor appetite. However, patient came out for breakfast and ate about 80% of his meal

## 2021-12-31 NOTE — Progress Notes (Signed)
Recreation Therapy Notes  Date: 12/31/2021  Time: 1:30pm   Location: Courtyard    Behavioral response: Appropriate  Intervention Topic:  Relaxation    Discussion/Intervention:  Group content today was focused on relaxation. The group defined relaxation and identified healthy ways to relax. Individuals expressed how much time they spend relaxing. Patients expressed how much their life would be if they did not make time for themselves to relax. The group stated ways they could improve their relaxation techniques in the future.  Individuals participated in the intervention Time to Relax where they had a chance to experience different relaxation techniques.   Clinical Observations/Feedback: Patient came to group and defined relaxation as deep breathing, listening to music and getting outside. Individual was social with peers and staff while participating in the intervention.   Akanksha Bellmore LRT/CTRS          Oliviarose Punch 12/31/2021 4:00 PM

## 2021-12-31 NOTE — Progress Notes (Signed)
[facility] MD Progress Note  12/31/2021 10:51 AM [name]  MRN:  [id number] Subjective: [name] continues to complain that he cannot sleep.  Nurses note that he was up last night.  He was given melatonin and trazodone and eventually fell asleep.  I told him he needs a sleep study but our hospital does not do it so we will have to get it outpatient.  In the meantime we will continue to make med changes.  Principal Problem: MDD (major depressive disorder), recurrent episode ([place]) Diagnosis: Principal Problem:   MDD (major depressive disorder), recurrent episode ([place])  Total Time spent with patient: 15 minutes  Past Psychiatric History: yes  Past Medical History:  Past Medical History:  Diagnosis Date   Atrial fibrillation ([place]) 08/2009   CAD (coronary artery disease)    s/p CABG -- 1992   Depression    ESRD (end stage renal disease) ([place])    HLD (hyperlipidemia)    HTN (hypertension)    Idiopathic thrombocytopenia (HCC)     Past Surgical History:  Procedure Laterality Date   APPENDECTOMY     CORONARY ARTERY BYPASS GRAFT     FACIAL COSMETIC SURGERY     GALLBLADDER SURGERY     HERNIA REPAIR     KIDNEY TRANSPLANT     LEFT HEART CATH AND CORONARY ANGIOGRAPHY N/A 08/07/2018   Procedure: LEFT HEART CATH AND CORONARY ANGIOGRAPHY;  Surgeon: Sherren Mocha, MD;  Location: [place] [facility];  Service: Cardiovascular;  Laterality: N/A;   LIVER TRANSPLANT     PRESSURE SENSOR/CARDIOMEMS N/A 08/04/2021   Procedure: PRESSURE SENSOR/CARDIOMEMS;  Surgeon: Larey [place], MD;  Location: [place] [facility];  Service: Cardiovascular;  Laterality: N/A;   RIGHT HEART CATH N/A 06/25/2021   Procedure: RIGHT HEART CATH;  Surgeon: Larey [place], MD;  Location: [place] [facility];  Service: Cardiovascular;  Laterality: N/A;   Family History:  Family History  Problem Relation Age of Onset   Atrial fibrillation Mother    Breast cancer Mother    Renal cancer Father    Hypertension Brother     Hyperlipidemia Brother    Other Brother        stent    Renal Disease Maternal Grandmother    Stroke Maternal Grandmother    Heart attack Maternal Grandfather    Cancer Paternal Grandmother    Heart failure Paternal Grandfather    Emphysema Paternal Grandfather    Bronchiolitis Paternal Grandfather     Social History:  Social History   Substance and Sexual Activity  Alcohol Use No     Social History   Substance and Sexual Activity  Drug Use No    Social History   Socioeconomic History   Marital status: Single    Spouse name: Not on file   Number of children: 0   Years of education: Not on file   Highest education level: Not on file  Occupational History   Occupation: retired  Tobacco Use   Smoking status: Never   Smokeless tobacco: Never  Vaping Use   Vaping Use: Never used  Substance and Sexual Activity   Alcohol use: No   Drug use: No   Sexual activity: Not on file  Other Topics Concern   Not on file  Social History Narrative   Not on file   Social Determinants of Health   Financial Resource Strain: Low Risk    Difficulty of Paying Living Expenses: Not very hard  Food Insecurity: No Food Insecurity  Worried About Charity fundraiser in the Last Year: Never true   [place] in the Last Year: Never true  Transportation Needs: No Transportation Needs   Lack of Transportation (Medical): No   Lack of Transportation (Non-Medical): No  Physical Activity: Not on file  Stress: Not on file  Social Connections: Not on file   Additional Social History:                         Sleep: Poor  Appetite:  Fair  Current Medications: Current Facility-Administered Medications  Medication Dose Route Frequency Provider Last Rate Last Admin   acetaminophen (TYLENOL) tablet 650 mg  650 mg Oral Q6H PRN Starkes-Perry, Gayland Curry, FNP       alum & mag hydroxide-simeth (MAALOX/MYLANTA) 200-200-20 MG/5ML suspension 30 mL  30 mL Oral Q4H PRN Starkes-Perry,  Gayland Curry, FNP       amiodarone (PACERONE) tablet 200 mg  200 mg Oral Daily Starkes-Perry, Takia S, FNP   200 mg at 12/31/21 0900   ARIPiprazole (ABILIFY) tablet 10 mg  10 mg Oral Daily Parks Ranger, DO   10 mg at 12/31/21 0900   atorvastatin (LIPITOR) tablet 20 mg  20 mg Oral QHS Suella Broad, FNP   20 mg at 12/30/21 2149   chlorproMAZINE (THORAZINE) tablet 50 mg  50 mg Oral QHS Parks Ranger, DO   50 mg at 12/30/21 2150   dapagliflozin propanediol (FARXIGA) tablet 10 mg  10 mg Oral Daily Suella Broad, FNP   10 mg at 12/31/21 0900   hydrOXYzine (ATARAX) tablet 25 mg  25 mg Oral TID PRN He, [name], MD   25 mg at 12/31/21 0116   magnesium hydroxide (MILK OF MAGNESIA) suspension 30 mL  30 mL Oral Daily PRN Suella Broad, FNP       magnesium oxide (MAG-OX) tablet 400 mg  400 mg Oral BID Suella Broad, FNP   400 mg at 12/31/21 0900   mirtazapine (REMERON) tablet 30 mg  30 mg Oral QHS He, [name], MD   30 mg at 12/30/21 2149   ondansetron (ZOFRAN) tablet 4 mg  4 mg Oral Q8H PRN He, [name], MD   4 mg at 12/27/21 0848   potassium chloride SA (KLOR-CON M) CR tablet 40 mEq  40 mEq Oral TID Suella Broad, FNP   40 mEq at 12/31/21 0900   ramelteon (ROZEREM) tablet 8 mg  8 mg Oral QHS Parks Ranger, DO       tacrolimus (PROGRAF) capsule 0.5 mg  0.5 mg Oral [name], FNP   0.5 mg at 12/30/21 0919   tacrolimus (PROGRAF) capsule 1 mg  1 mg Oral [name], FNP   1 mg at 12/31/21 0900   temazepam (RESTORIL) capsule 15 mg  15 mg Oral QHS Parks Ranger, DO   15 mg at 12/30/21 2149   torsemide (DEMADEX) tablet 100 mg  100 mg Oral Daily Suella Broad, FNP   100 mg at 12/31/21 0900   traZODone (DESYREL) tablet 100 mg  100 mg Oral QHS PRN Suella Broad, FNP   100 mg at 12/31/21 0116   warfarin (COUMADIN) tablet 2 mg  2 mg Oral Once per day on Sun Tue Wed Thu Fri Sat Parks Ranger,  DO   2 mg at 12/30/21 1905   warfarin (COUMADIN) tablet 4 mg  4 mg Oral Q  Mon-1800 Suella Broad, FNP   4 mg at 12/28/21 1701   Warfarin - Physician Dosing Inpatient   Does not apply q1600 [name], [facility]        Lab Results:  Results for orders placed or performed during the hospital encounter of 12/25/21 (from the past 48 hour(s))  Protime-INR     Status: Abnormal   Collection Time: 12/30/21  6:09 AM  Result Value Ref Range   Prothrombin Time 31.5 (H) 11.4 - 15.2 seconds   INR 3.0 (H) 0.8 - 1.2    Comment: (NOTE) INR goal varies based on device and disease states. Performed at [facility], [address] [place], [place] [address]   Protime-INR     Status: Abnormal   Collection Time: 12/31/21  6:09 AM  Result Value Ref Range   Prothrombin Time 33.0 (H) 11.4 - 15.2 seconds   INR 3.2 (H) 0.8 - 1.2    Comment: (NOTE) INR goal varies based on device and disease states. Performed at [facility], [address] [place], [place] [address]     Blood Alcohol level:  Lab Results  Component Value Date   [facility] <10 12/24/2021   ETH <10 48/11/6551    Metabolic Disorder Labs: Lab Results  Component Value Date   HGBA1C 5.2 11/17/2021   MPG 102.54 11/17/2021   MPG 105.41 08/05/2018   No results found for: PROLACTIN Lab Results  Component Value Date   CHOL 117 11/17/2021   TRIG 96 11/17/2021   HDL 28 (L) 11/17/2021   CHOLHDL 4.2 11/17/2021   VLDL 19 11/17/2021   LDLCALC 70 11/17/2021   LDLCALC 87 08/22/2019    Physical Findings: AIMS:  , ,  ,  ,    CIWA:    COWS:     Musculoskeletal: Strength & Muscle Tone: within normal limits Gait & Station: normal Patient leans: N/A  Psychiatric Specialty Exam:  Presentation  General Appearance: No data recorded Eye Contact:No data recorded Speech:No data recorded Speech Volume:No data recorded Handedness:No data recorded  Mood and Affect  Mood:No data recorded Affect:No data  recorded  Thought Process  Thought Processes:No data recorded Descriptions of Associations:No data recorded Orientation:No data recorded Thought Content:No data recorded History of Schizophrenia/Schizoaffective disorder:No  Duration of Psychotic Symptoms:No data recorded Hallucinations:No data recorded Ideas of Reference:No data recorded Suicidal Thoughts:No data recorded Homicidal Thoughts:No data recorded  Sensorium  Memory:No data recorded Judgment:No data recorded Insight:No data recorded  Executive Functions  Concentration:No data recorded Attention Span:No data recorded Recall:No data recorded Fund of Knowledge:No data recorded Language:No data recorded  Psychomotor Activity  Psychomotor Activity:No data recorded  Assets  Assets:No data recorded  Sleep  Sleep:No data recorded   Physical Exam: Physical Exam Vitals and nursing note reviewed.  Constitutional:      Appearance: Normal appearance. He is normal weight.  Neurological:     General: No focal deficit present.     Mental Status: He is alert and oriented to person, place, and time.  Psychiatric:        Attention and Perception: Attention and perception normal.        Mood and Affect: Mood is depressed. Affect is flat.        Speech: Speech normal.        Behavior: Behavior normal. Behavior is cooperative.        Thought Content: Thought content normal.        Cognition and Memory: Cognition and memory normal.  Judgment: Judgment normal.   Review of Systems  Constitutional: Negative.   HENT: Negative.    Eyes: Negative.   Respiratory: Negative.    Cardiovascular: Negative.   Gastrointestinal: Negative.   Genitourinary: Negative.   Musculoskeletal: Negative.   Skin: Negative.   Neurological: Negative.   Endo/Heme/Allergies: Negative.   Psychiatric/Behavioral:  Positive for depression. The patient has insomnia.   Blood pressure 124/71, pulse 76, temperature 98 F (36.7 C), resp. rate  18, height 5\' 10"  (1.778 m), weight 81.2 kg, SpO2 96 %. Body mass index is 25.69 kg/m.   Treatment Plan Summary: Daily contact with patient to assess and evaluate symptoms and progress in treatment, Medication management, and Plan discontinue daytime Seroquel because he is not getting any effect from it anyway.  Start Rozerem at bedtime.  Continue Remeron, trazodone as needed, Thorazine and Abilify.  And Restoril at bedtime.  [place], DO 12/31/2021, 10:51 AM

## 2022-01-01 DIAGNOSIS — F333 Major depressive disorder, recurrent, severe with psychotic symptoms: Secondary | ICD-10-CM | POA: Diagnosis not present

## 2022-01-01 MED ORDER — LORAZEPAM 1 MG PO TABS
1.0000 mg | ORAL_TABLET | ORAL | Status: DC | PRN
  Administered 2022-01-02 – 2022-01-03 (×2): 1 mg via ORAL
  Filled 2022-01-01 (×2): qty 1

## 2022-01-01 MED ORDER — OLANZAPINE 5 MG PO TABS
5.0000 mg | ORAL_TABLET | ORAL | Status: DC
  Administered 2022-01-01 – 2022-01-04 (×6): 5 mg via ORAL
  Filled 2022-01-01 (×7): qty 1

## 2022-01-01 MED ORDER — TEMAZEPAM 15 MG PO CAPS
30.0000 mg | ORAL_CAPSULE | Freq: Every day | ORAL | Status: DC
Start: 2022-01-01 — End: 2022-01-06
  Administered 2022-01-01 – 2022-01-04 (×4): 30 mg via ORAL
  Filled 2022-01-01 (×4): qty 2

## 2022-01-01 NOTE — Progress Notes (Signed)
Patient denies SI, HI, and AVH. He endorses anxiety and rates it as a 10/10 and rates depression as a 5/10. Patient says that he may have dozed off when he went to bed, but was unable to sleep for most of the night. He said the Vistaril at 4am did not help with sleep. Patient states, "I really need help with my medication." Patient is active in the milieu and came out for breakfast and group with MHT. Patient is compliant with scheduled medications. Support and encouragement provided. Patient remains safe on the unit at this time.

## 2022-01-01 NOTE — [facility] Group Notes (Addendum)
[facility] Group Notes:  (Nursing/MHT/Case Management/Adjunct)  Date:  01/01/2022  Time:  6:16 PM  Type of Therapy:  Group Therapy  Participation Level:  Active  Participation Quality:  Appropriate, Attentive, Sharing, and Supportive  Affect:  Appropriate and Flat  Cognitive:  Alert, Appropriate, and Oriented  Insight:  Appropriate  Engagement in Group:  Engaged and Supportive  Modes of Intervention:  Discussion and Support  Summary of Progress/Problems: Pt. was engaged in group, contributed to the discussion and engaged in discussion with another patient.   Juliette Alcide 01/01/2022, 6:16 PM

## 2022-01-01 NOTE — Plan of Care (Signed)
Pt able to verbalize his feelings at all times. Emotional support given.  Problem: Coping: Goal: Coping ability will improve Outcome: Progressing Goal: Will verbalize feelings Outcome: Progressing

## 2022-01-01 NOTE — Plan of Care (Signed)
°  Problem: Communication Goal: STG - Patient will identify benefit of improved communication within 5 recreation therapy group sessions Description: STG - Patient will identify benefit of improved communication within 5 recreation therapy group sessions Outcome: Progressing

## 2022-01-01 NOTE — Progress Notes (Signed)
[facility] MD Progress Note  01/01/2022 10:55 AM [name]  MRN:  [id number] Subjective: [name] continues to be depressed and flat.  He was up pretty much all night last night.  I looked into a sleep study and I will have to be done outpatient.  In the meantime about a again make some med changes.  He is able to contract for safety while in the hospital but not if he is discharged.  He is not ready.  He also complains of a lot of anxiety.  Principal Problem: MDD (major depressive disorder), recurrent episode ([place]) Diagnosis: Principal Problem:   MDD (major depressive disorder), recurrent episode ([place])  Total Time spent with patient: 15 minutes  Past Psychiatric History: yes  Past Medical History:  Past Medical History:  Diagnosis Date   Atrial fibrillation ([place]) 08/2009   CAD (coronary artery disease)    s/p CABG -- 1992   Depression    ESRD (end stage renal disease) ([place])    HLD (hyperlipidemia)    HTN (hypertension)    Idiopathic thrombocytopenia (HCC)     Past Surgical History:  Procedure Laterality Date   APPENDECTOMY     CORONARY ARTERY BYPASS GRAFT     FACIAL COSMETIC SURGERY     GALLBLADDER SURGERY     HERNIA REPAIR     KIDNEY TRANSPLANT     LEFT HEART CATH AND CORONARY ANGIOGRAPHY N/A 08/07/2018   Procedure: LEFT HEART CATH AND CORONARY ANGIOGRAPHY;  Surgeon: Sherren Mocha, MD;  Location: [place] [facility];  Service: Cardiovascular;  Laterality: N/A;   LIVER TRANSPLANT     PRESSURE SENSOR/CARDIOMEMS N/A 08/04/2021   Procedure: PRESSURE SENSOR/CARDIOMEMS;  Surgeon: Larey Dresser, MD;  Location: [place] [facility];  Service: Cardiovascular;  Laterality: N/A;   RIGHT HEART CATH N/A 06/25/2021   Procedure: RIGHT HEART CATH;  Surgeon: Larey Dresser, MD;  Location: [place] [facility];  Service: Cardiovascular;  Laterality: N/A;   Family History:  Family History  Problem Relation Age of Onset   Atrial fibrillation Mother    Breast cancer Mother    Renal  cancer Father    Hypertension Brother    Hyperlipidemia Brother    Other Brother        stent    Renal Disease Maternal Grandmother    Stroke Maternal Grandmother    Heart attack Maternal Grandfather    Cancer Paternal Grandmother    Heart failure Paternal Grandfather    Emphysema Paternal Grandfather    Bronchiolitis Paternal Grandfather     Social History:  Social History   Substance and Sexual Activity  Alcohol Use No     Social History   Substance and Sexual Activity  Drug Use No    Social History   Socioeconomic History   Marital status: Single    Spouse name: Not on file   Number of children: 0   Years of education: Not on file   Highest education level: Not on file  Occupational History   Occupation: retired  Tobacco Use   Smoking status: Never   Smokeless tobacco: Never  Vaping Use   Vaping Use: Never used  Substance and Sexual Activity   Alcohol use: No   Drug use: No   Sexual activity: Not on file  Other Topics Concern   Not on file  Social History Narrative   Not on file   Social Determinants of Health   Financial Resource Strain: Low Risk    Difficulty of  Paying Living Expenses: Not very hard  Food Insecurity: No Food Insecurity   Worried About [place] in the Last Year: Never true   Ran Out of Food in the Last Year: Never true  Transportation Needs: No Transportation Needs   Lack of Transportation (Medical): No   Lack of Transportation (Non-Medical): No  Physical Activity: Not on file  Stress: Not on file  Social Connections: Not on file   Additional Social History:                         Sleep: Poor  Appetite:  Fair  Current Medications: Current Facility-Administered Medications  Medication Dose Route Frequency Provider Last Rate Last Admin   acetaminophen (TYLENOL) tablet 650 mg  650 mg Oral Q6H PRN Starkes-Perry, Gayland Curry, FNP       alum & mag hydroxide-simeth (MAALOX/MYLANTA) 200-200-20 MG/5ML suspension  30 mL  30 mL Oral Q4H PRN Starkes-Perry, Gayland Curry, FNP       amiodarone (PACERONE) tablet 200 mg  200 mg Oral Daily Suella Broad, FNP   200 mg at 01/01/22 0931   atorvastatin (LIPITOR) tablet 20 mg  20 mg Oral QHS Suella Broad, FNP   20 mg at 12/31/21 2150   dapagliflozin propanediol (FARXIGA) tablet 10 mg  10 mg Oral Daily Suella Broad, FNP   10 mg at 01/01/22 0931   LORazepam (ATIVAN) tablet 1 mg  1 mg Oral Q4H PRN Parks Ranger, DO       magnesium hydroxide (MILK OF MAGNESIA) suspension 30 mL  30 mL Oral Daily PRN Starkes-Perry, Gayland Curry, FNP       magnesium oxide (MAG-OX) tablet 400 mg  400 mg Oral BID Suella Broad, FNP   400 mg at 01/01/22 0931   mirtazapine (REMERON) tablet 30 mg  30 mg Oral QHS He, [name], MD   30 mg at 12/31/21 2150   OLANZapine (ZYPREXA) tablet 5 mg  5 mg Oral BH-[id number] Shakeitha Umbaugh Edward, DO       ondansetron [facility]) tablet 4 mg  4 mg Oral Q8H PRN He, [name], MD   4 mg at 12/27/21 0848   potassium chloride SA (KLOR-CON M) CR tablet 40 mEq  40 mEq Oral TID Suella Broad, FNP   40 mEq at 01/01/22 0931   ramelteon (ROZEREM) tablet 8 mg  8 mg Oral QHS Parks Ranger, DO   8 mg at 12/31/21 2150   tacrolimus (PROGRAF) capsule 0.5 mg  0.5 mg Oral [name], FNP   0.5 mg at 01/01/22 0931   tacrolimus (PROGRAF) capsule 1 mg  1 mg Oral [name], FNP   1 mg at 12/31/21 0900   temazepam (RESTORIL) capsule 30 mg  30 mg Oral QHS Parks Ranger, DO       torsemide [facility]) tablet 100 mg  100 mg Oral Daily Suella Broad, FNP   100 mg at 01/01/22 0931   traZODone (DESYREL) tablet 100 mg  100 mg Oral QHS PRN Suella Broad, FNP   100 mg at 12/31/21 0116   [START ON 01/02/2022] warfarin (COUMADIN) tablet 2 mg  2 mg Oral Once per day on Sun Tue Wed Thu Fri Sat Parks Ranger, DO       warfarin (COUMADIN) tablet 4 mg  4 mg Oral Q Mon-1800 Suella Broad, FNP   4 mg at 12/28/21 1701  Warfarin - Physician Dosing Inpatient   Does not apply q1600 [name], [facility]   Given at 12/31/21 1623    Lab Results:  Results for orders placed or performed during the hospital encounter of 12/25/21 (from the past 48 hour(s))  Protime-INR     Status: Abnormal   Collection Time: 12/31/21  6:09 AM  Result Value Ref Range   Prothrombin Time 33.0 (H) 11.4 - 15.2 seconds   INR 3.2 (H) 0.8 - 1.2    Comment: (NOTE) INR goal varies based on device and disease states. Performed at [facility], [address] [place], [place] [address]     Blood Alcohol level:  Lab Results  Component Value Date   [facility] <10 12/24/2021   ETH <10 45/80/9983    Metabolic Disorder Labs: Lab Results  Component Value Date   HGBA1C 5.2 11/17/2021   MPG 102.54 11/17/2021   MPG 105.41 08/05/2018   No results found for: PROLACTIN Lab Results  Component Value Date   CHOL 117 11/17/2021   TRIG 96 11/17/2021   HDL 28 (L) 11/17/2021   CHOLHDL 4.2 11/17/2021   VLDL 19 11/17/2021   LDLCALC 70 11/17/2021   LDLCALC 87 08/22/2019    Physical Findings: AIMS:  , ,  ,  ,    CIWA:    COWS:     Musculoskeletal: Strength & Muscle Tone: within normal limits Gait & Station: normal Patient leans: N/A  Psychiatric Specialty Exam:  Presentation  General Appearance: No data recorded Eye Contact:No data recorded Speech:No data recorded Speech Volume:No data recorded Handedness:No data recorded  Mood and Affect  Mood:No data recorded Affect:No data recorded  Thought Process  Thought Processes:No data recorded Descriptions of Associations:No data recorded Orientation:No data recorded Thought Content:No data recorded History of Schizophrenia/Schizoaffective disorder:No  Duration of Psychotic Symptoms:No data recorded Hallucinations:No data recorded Ideas of Reference:No data recorded Suicidal Thoughts:No data recorded Homicidal Thoughts:No data  recorded  Sensorium  Memory:No data recorded Judgment:No data recorded Insight:No data recorded  Executive Functions  Concentration:No data recorded Attention Span:No data recorded Recall:No data recorded Fund of Knowledge:No data recorded Language:No data recorded  Psychomotor Activity  Psychomotor Activity:No data recorded  Assets  Assets:No data recorded  Sleep  Sleep:No data recorded   Physical Exam: Physical Exam Vitals and nursing note reviewed.  Constitutional:      Appearance: Normal appearance. He is normal weight.  Neurological:     General: No focal deficit present.     Mental Status: He is alert and oriented to person, place, and time.  Psychiatric:        Attention and Perception: Attention and perception normal.        Mood and Affect: Mood is anxious and depressed. Affect is flat.        Speech: Speech normal.        Behavior: Behavior normal. Behavior is cooperative.        Thought Content: Thought content normal.        Cognition and Memory: Cognition and memory normal.   Review of Systems  Constitutional: Negative.   HENT: Negative.    Eyes: Negative.   Respiratory: Negative.    Cardiovascular: Negative.   Gastrointestinal: Negative.   Genitourinary: Negative.   Musculoskeletal: Negative.   Skin: Negative.   Neurological: Negative.   Endo/Heme/Allergies: Negative.   Psychiatric/Behavioral:  Positive for depression. The patient is nervous/anxious and has insomnia.   Blood pressure 113/64, pulse 66, temperature (!) 97.4 F (36.3 C),  temperature source Oral, resp. rate 18, height 5\' 10"  (1.778 m), weight 81.2 kg, SpO2 93 %. Body mass index is 25.69 kg/m.   Treatment Plan Summary: Daily contact with patient to assess and evaluate symptoms and progress in treatment, Medication management, and Plan discontinue hydroxyzine and start Ativan 1 mg as needed.  Increase Restoril to 30 mg/day.  Start Zyprexa 5 mg twice a day.  Continue Remeron 30 mg at  bedtime.  Continue Rozerem 8 mg at bedtime.  Discontinue Abilify.  Parks Ranger, DO 01/01/2022, 10:55 AM

## 2022-01-01 NOTE — Progress Notes (Signed)
Recreation Therapy Notes  Date: 01/01/2022   Time: 1:40pm    Location: Craft room   Behavioral response: Appropriate   Intervention Topic:  Wellness    Discussion/Intervention:  Group content today was focused on Wellness. The group defined wellness and some positive ways they make decisions for themselves. Individuals expressed reasons why they neglected any wellness in the past. Patients described ways to improve wellness skills in the future. The group explained what could happen if they did not do any wellness at all. Participants express how bad choices has affected them and others around them. Individual explained the importance of wellness. The group participated in the intervention Testing my Wellness where they had a chance to identify some of their weaknesses and strengths in wellness.  Clinical Observations/Feedback: Patient came to group and was focused on what peers and staff had to say about wellness. He stated that he participates in wellness by getting exercise, talking to friends and listening to music.Individual was social with peers and staff while participating in the intervention.   Tiauna Whisnant LRT/CTRS         Tayia Stonesifer 01/01/2022 2:10 PM

## 2022-01-01 NOTE — Progress Notes (Signed)
Pt in dayroom at the beginning of the shift. Pt reports that he still has anxiety and rates it a 10/10. Pt states that he still does not feel good. Pt denies SI/HI/AVH. Pt medication compliant at bedtime. Snacks and refreshments given. Emotional support given. Q15 mins safety checks maintained. Will continue to monitor.

## 2022-01-02 NOTE — Group Note (Signed)

## 2022-01-02 NOTE — BH IP Treatment Plan (Signed)
Interdisciplinary Treatment and Diagnostic Plan Update  01/02/2022 Time of Session: 0830 [name] MRN: [id number]  Principal Diagnosis: MDD (major depressive disorder), recurrent episode ([place])  Secondary Diagnoses: Principal Problem:   MDD (major depressive disorder), recurrent episode ([place])   Current Medications:  Current Facility-Administered Medications  Medication Dose Route Frequency Provider Last Rate Last Admin   acetaminophen (TYLENOL) tablet 650 mg  650 mg Oral Q6H PRN Starkes-Perry, Gayland Curry, FNP       alum & mag hydroxide-simeth (MAALOX/MYLANTA) 200-200-20 MG/5ML suspension 30 mL  30 mL Oral Q4H PRN Starkes-Perry, Gayland Curry, FNP       amiodarone (PACERONE) tablet 200 mg  200 mg Oral Daily Suella Broad, FNP   200 mg at 01/01/22 0931   atorvastatin (LIPITOR) tablet 20 mg  20 mg Oral QHS Suella Broad, FNP   20 mg at 01/01/22 2158   dapagliflozin propanediol (FARXIGA) tablet 10 mg  10 mg Oral Daily Suella Broad, FNP   10 mg at 01/01/22 0931   LORazepam (ATIVAN) tablet 1 mg  1 mg Oral Q4H PRN Parks Ranger, DO   1 mg at 01/02/22 7829   magnesium hydroxide (MILK OF MAGNESIA) suspension 30 mL  30 mL Oral Daily PRN Suella Broad, FNP       magnesium oxide (MAG-OX) tablet 400 mg  400 mg Oral BID Suella Broad, FNP   400 mg at 01/01/22 2157   mirtazapine (REMERON) tablet 30 mg  30 mg Oral QHS He, [name], MD   30 mg at 01/01/22 2157   OLANZapine (ZYPREXA) tablet 5 mg  5 mg Oral BH-q8a4p Parks Ranger, DO   5 mg at 01/02/22 0820   ondansetron (ZOFRAN) tablet 4 mg  4 mg Oral Q8H PRN He, [name], MD   4 mg at 12/27/21 0848   potassium chloride SA (KLOR-CON M) CR tablet 40 mEq  40 mEq Oral TID Suella Broad, FNP   40 mEq at 01/01/22 2158   ramelteon (ROZEREM) tablet 8 mg  8 mg Oral QHS Parks Ranger, DO   8 mg at 01/01/22 2157   tacrolimus (PROGRAF) capsule 0.5 mg  0.5 mg Oral [name],  FNP   0.5 mg at 01/01/22 0931   tacrolimus (PROGRAF) capsule 1 mg  1 mg Oral [name], FNP   1 mg at 12/31/21 0900   temazepam (RESTORIL) capsule 30 mg  30 mg Oral QHS Parks Ranger, DO   30 mg at 01/01/22 2157   torsemide (DEMADEX) tablet 100 mg  100 mg Oral Daily Suella Broad, FNP   100 mg at 01/01/22 0931   traZODone (DESYREL) tablet 100 mg  100 mg Oral QHS PRN Suella Broad, FNP   100 mg at 01/02/22 5621   warfarin (COUMADIN) tablet 2 mg  2 mg Oral Once per day on Sun Tue Wed Thu Fri Sat Parks Ranger, DO       warfarin (COUMADIN) tablet 4 mg  4 mg Oral Q Mon-1800 Suella Broad, FNP   4 mg at 12/28/21 1701   Warfarin - Physician Dosing Inpatient   Does not apply q1600 [name], [facility]   Given at 01/01/22 1608   PTA Medications: Medications Prior to Admission  Medication Sig Dispense Refill Last Dose   allopurinol (ZYLOPRIM) 100 MG tablet Take 100 mg by mouth daily.      amiodarone (PACERONE) 200 MG tablet Take 1 tablet (  200 mg total) by mouth daily. 30 tablet 5    ARIPiprazole (ABILIFY) 5 MG tablet Take 1 tablet (5 mg total) by mouth at bedtime. 30 tablet 2    atorvastatin (LIPITOR) 40 MG tablet Take 1 tablet (40 mg total) by mouth at bedtime. (Patient taking differently: Take 20 mg by mouth at bedtime.) 90 tablet 3    calcitRIOL (ROCALTROL) 0.25 MCG capsule Take 0.25 mcg by mouth daily.      Cholecalciferol (VITAMIN D3) 50 MCG (2000 UT) TABS Take 2,000 Units by mouth daily.      clonazePAM (KLONOPIN) 1 MG tablet Take 1 tablet (1 mg total) by mouth at bedtime. 30 tablet 2    Cyanocobalamin (VITAMIN B12) 1000 MCG TBCR Take 1,000 mcg by mouth daily.      dapagliflozin propanediol (FARXIGA) 10 MG TABS tablet Take 1 tablet (10 mg total) by mouth daily. 30 tablet 6    EPINEPHrine (EPI-PEN) 0.3 mg/0.3 mL DEVI Inject 0.3 mg into the muscle once as needed (severe allergic reaction). (Patient not taking: Reported on 12/24/2021)       ferrous sulfate 325 (65 FE) MG tablet Take 325 mg by mouth daily with breakfast.      fish oil-omega-3 fatty acids 1000 MG capsule Take 1 g by mouth 2 (two) times daily.      MAGNESIUM-OXIDE 400 (240 Mg) MG tablet Take 400 mg by mouth 2 (two) times daily.      mirtazapine (REMERON) 15 MG tablet Take 1 tablet (15 mg total) by mouth at bedtime. 30 tablet 2    nitroGLYCERIN (NITROSTAT) 0.4 MG SL tablet Place 0.4 mg under the tongue every 5 (five) minutes as needed for chest pain. (Patient not taking: Reported on 12/24/2021)      potassium chloride SA (KLOR-CON) 20 MEQ tablet Take 2 tablets (40 mEq total) by mouth 3 (three) times daily. 90 tablet 6    tacrolimus (PROGRAF) 0.5 MG capsule Take 0.5 mg by mouth See admin instructions. Takes 0.5 mg  tablet by mouth one day and 1 mg tablets next day (1 and 2 tablets on alternate days)      torsemide (DEMADEX) 20 MG tablet Take 5 tablets (100 mg total) by mouth daily. 210 tablet 6    traZODone (DESYREL) 100 MG tablet Take 1 tablet (100 mg total) by mouth at bedtime as needed for sleep. 30 tablet 2    warfarin (COUMADIN) 4 MG tablet TAKE AS DIRECTED BY COUMADIN CLINIC (Patient taking differently: Take 2-4 mg by mouth See admin instructions. Take 86m by mouth on Monday, then take 287m(1/2 tablet) all other days) 65 tablet 1     Patient Stressors: Health problems   Other: loneliness    Patient Strengths: Ability for insight  Average or above average intelligence   Treatment Modalities: Medication Management, Group therapy, Case management,  1 to 1 session with clinician, Psychoeducation, Recreational therapy.   Physician Treatment Plan for Primary Diagnosis: MDD (major depressive disorder), [place] Improvement in symptoms so as ready for discharge   Short Term Goals: Ability to identify changes in lifestyle to reduce recurrence of condition will improve Ability to verbalize feelings will improve Ability to disclose  and discuss suicidal ideas Ability to demonstrate self-control will improve Ability to identify and develop effective coping behaviors will improve Ability to maintain clinical measurements within normal limits will improve Compliance with prescribed medications will improve Ability to identify triggers associated with substance abuse/mental health issues will improve  Medication Management: Evaluate patient's response, side effects, and tolerance of medication regimen.  Therapeutic Interventions: 1 to 1 sessions, Unit Group sessions and Medication administration.  Evaluation of Outcomes: Not Met  Physician Treatment Plan for Secondary Diagnosis: Principal Problem:   MDD (major depressive disorder), recurrent episode ([place])  Long Term Goal(s): Improvement in symptoms so as ready for discharge   Short Term Goals: Ability to identify changes in lifestyle to reduce recurrence of condition will improve Ability to verbalize feelings will improve Ability to disclose and discuss suicidal ideas Ability to demonstrate self-control will improve Ability to identify and develop effective coping behaviors will improve Ability to maintain clinical measurements within normal limits will improve Compliance with prescribed medications will improve Ability to identify triggers associated with substance abuse/mental health issues will improve     Medication Management: Evaluate patient's response, side effects, and tolerance of medication regimen.  Therapeutic Interventions: 1 to 1 sessions, Unit Group sessions and Medication administration.  Evaluation of Outcomes: Not Met   RN Treatment Plan for Primary Diagnosis: MDD (major depressive disorder), recurrent episode ([place]) Long Term Goal(s): Knowledge of disease and therapeutic regimen to maintain health will improve  Short Term Goals: Ability to remain free from injury will improve, Ability to verbalize frustration and anger appropriately will  improve, Ability to demonstrate self-control, Ability to participate in decision making will improve, Ability to verbalize feelings will improve, Ability to disclose and discuss suicidal ideas, Ability to identify and develop effective coping behaviors will improve, and Compliance with prescribed medications will improve  Medication Management: RN will administer medications as ordered by provider, will assess and evaluate patient's response and provide education to patient for prescribed medication. RN will report any adverse and/or side effects to prescribing provider.  Therapeutic Interventions: 1 on 1 counseling sessions, Psychoeducation, Medication administration, Evaluate responses to treatment, Monitor vital signs and CBGs as ordered, Perform/monitor CIWA, COWS, AIMS and Fall Risk screenings as ordered, Perform wound care treatments as ordered.  Evaluation of Outcomes: Not Met   LCSW Treatment Plan for Primary Diagnosis: MDD (major depressive disorder), recurrent episode ([place]) Long Term Goal(s): Safe transition to appropriate next level of care at discharge, Engage patient in therapeutic group addressing interpersonal concerns.  Short Term Goals: Engage patient in aftercare planning with referrals and resources, Increase social support, Increase ability to appropriately verbalize feelings, Increase emotional regulation, Facilitate acceptance of mental health diagnosis and concerns, Facilitate patient progression through stages of change regarding substance use diagnoses and concerns, Identify triggers associated with mental health/substance abuse issues, and Increase skills for wellness and recovery  Therapeutic Interventions: Assess for all discharge needs, 1 to 1 time with Social worker, Explore available resources and support systems, Assess for adequacy in community support network, Educate family and significant other(s) on suicide prevention, Complete Psychosocial Assessment, Interpersonal  group therapy.  Evaluation of Outcomes: Not Met   Progress in Treatment: Attending groups: Yes. Participating in groups: Yes. Taking medication as prescribed: Yes. Toleration medication: Yes. Family/Significant other contact made: Yes, individual(s) contacted:  Kathlen Mody, sister.  Patient understands diagnosis: Yes. Discussing patient identified problems/goals with staff: Yes. Medical problems stabilized or resolved: Yes. Denies suicidal/homicidal ideation: Yes. Issues/concerns per patient self-inventory: Yes. Other: none   New problem(s) identified: No, Describe:  No additional concerns/problems identified at this time.  New Short Term/Long Term Goal(s): Patient to work towards medication management for mood stabilization; development of comprehensive mental wellness plan.  Patient Goals: No additional goals identified at this time.    Discharge  Plan or Barriers: No psychosocial barriers identified at this time, patient to return to place of residence when appropriate for discharge. CSW will assist pt in development of an appropriate discharge/aftercare plan.  Reason for Continuation of Hospitalization: Depression  Estimated Length of Stay: 1-7 days    Scribe for Treatment Team: Larose Kells 01/02/2022 8:58 AM

## 2022-01-02 NOTE — Progress Notes (Signed)
Patient was compliant with medication.  He endorses anxiety and rates it 10/10.  Stated he was having trouble sleeping, medication was given and he went to sleep throughout the night.  He stated in the milieu and watched TV during snack time.  He was A & O x 3.  He denied SI, HI, and AVH.  Safety check of Q 15 was conducted on the unit to ensure patient safety. Support and encouragement was provided.

## 2022-01-02 NOTE — Progress Notes (Signed)
Patient ID: [name], male   DOB: June 29, 1951, 71 y.o.   MRN: [id number]  Pt assessed in his room, denied SI/HI/AVH or self harm thoughts but endorsing increasing anxiety and poor sleep overnight despite getting a sleeping aid. He ate his breakfast and requested for medication for anxiety. He was administered PRN Ativan with some relief as he was able to rest afterwards. He reports good appetite and elimination with no pain/discomfort. He ambulates with a steady gait with no falls or unsafe behavior noted thus far. Q15 min observations maintained for safety and support provided as needed.

## 2022-01-02 NOTE — Progress Notes (Signed)
[facility] MD Progress Note  01/02/2022 1:46 PM [name]  MRN:  [id number]  Principal Problem: MDD (major depressive disorder), recurrent episode ([place]) Diagnosis: Principal Problem:   MDD (major depressive disorder), recurrent episode ([place])  Patient is a  71y.o. male who presents to the [facility] unit due to worsened depression and anxiety.  Interval History Patient was seen today for re-evaluation.  Nursing reports no events overnight. The patient has no issues with performing ADLs.  Patient has been medication compliant.    Subjective:  On assessment patient reports he slept better and his anxiety had partially improved after last adjustments of medicine. He is still depressed, but less. He is not suicidal in the hospital. He is concerned about his depression if discharged, so he is not ready yet.  Current suicidal/homicidal ideations: Denies Current auditory/visual hallucinations: Denies The patient reports no side effects from medications.    Labs: no new results for review.     Total Time spent with patient: 30 minutes  Past Psychiatric History: see H&P  Past Medical History:  Past Medical History:  Diagnosis Date   Atrial fibrillation ([place]) 08/2009   CAD (coronary artery disease)    s/p CABG -- 1992   Depression    ESRD (end stage renal disease) ([place])    HLD (hyperlipidemia)    HTN (hypertension)    Idiopathic thrombocytopenia (HCC)     Past Surgical History:  Procedure Laterality Date   APPENDECTOMY     CORONARY ARTERY BYPASS GRAFT     FACIAL COSMETIC SURGERY     GALLBLADDER SURGERY     HERNIA REPAIR     KIDNEY TRANSPLANT     LEFT HEART CATH AND CORONARY ANGIOGRAPHY N/A 08/07/2018   Procedure: LEFT HEART CATH AND CORONARY ANGIOGRAPHY;  Surgeon: Sherren Mocha, MD;  Location: [place] [facility];  Service: Cardiovascular;  Laterality: N/A;   LIVER TRANSPLANT     PRESSURE SENSOR/CARDIOMEMS N/A 08/04/2021   Procedure: PRESSURE SENSOR/CARDIOMEMS;  Surgeon: Larey Dresser, MD;  Location: [place] [facility];  Service: Cardiovascular;  Laterality: N/A;   RIGHT HEART CATH N/A 06/25/2021   Procedure: RIGHT HEART CATH;  Surgeon: Larey Dresser, MD;  Location: [place] [facility];  Service: Cardiovascular;  Laterality: N/A;   Family History:  Family History  Problem Relation Age of Onset   Atrial fibrillation Mother    Breast cancer Mother    Renal cancer Father    Hypertension Brother    Hyperlipidemia Brother    Other Brother        stent    Renal Disease Maternal Grandmother    Stroke Maternal Grandmother    Heart attack Maternal Grandfather    Cancer Paternal Grandmother    Heart failure Paternal Grandfather    Emphysema Paternal Grandfather    Bronchiolitis Paternal Grandfather    Family Psychiatric  History: see H&P  Social History:  Social History   Substance and Sexual Activity  Alcohol Use No     Social History   Substance and Sexual Activity  Drug Use No    Social History   Socioeconomic History   Marital status: Single    Spouse name: Not on file   Number of children: 0   Years of education: Not on file   Highest education level: Not on file  Occupational History   Occupation: retired  Tobacco Use   Smoking status: Never   Smokeless tobacco: Never  Vaping Use   Vaping Use: Never used  Substance and Sexual Activity   Alcohol use: No   Drug use: No   Sexual activity: Not on file  Other Topics Concern   Not on file  Social History Narrative   Not on file   Social Determinants of Health   Financial Resource Strain: Low Risk    Difficulty of Paying Living Expenses: Not very hard  Food Insecurity: No Food Insecurity   Worried About Running Out of Food in the Last Year: Never true   Ran Out of Food in the Last Year: Never true  Transportation Needs: No Transportation Needs   Lack of Transportation (Medical): No   Lack of Transportation (Non-Medical): No  Physical Activity: Not on file  Stress: Not on file   Social Connections: Not on file   Additional Social History:                         Sleep: Fair  Appetite:  Fair  Current Medications: Current Facility-Administered Medications  Medication Dose Route Frequency Provider Last Rate Last Admin   acetaminophen (TYLENOL) tablet 650 mg  650 mg Oral Q6H PRN Starkes-Perry, Gayland Curry, FNP       alum & mag hydroxide-simeth (MAALOX/MYLANTA) 200-200-20 MG/5ML suspension 30 mL  30 mL Oral Q4H PRN Starkes-Perry, Gayland Curry, FNP       amiodarone (PACERONE) tablet 200 mg  200 mg Oral Daily Suella Broad, FNP   200 mg at 01/02/22 0926   atorvastatin (LIPITOR) tablet 20 mg  20 mg Oral QHS Suella Broad, FNP   20 mg at 01/01/22 2158   dapagliflozin propanediol (FARXIGA) tablet 10 mg  10 mg Oral Daily Suella Broad, FNP   10 mg at 01/02/22 7341   LORazepam (ATIVAN) tablet 1 mg  1 mg Oral Q4H PRN Parks Ranger, DO   1 mg at 01/02/22 9379   magnesium hydroxide (MILK OF MAGNESIA) suspension 30 mL  30 mL Oral Daily PRN Suella Broad, FNP       magnesium oxide (MAG-OX) tablet 400 mg  400 mg Oral BID Suella Broad, FNP   400 mg at 01/02/22 0926   mirtazapine (REMERON) tablet 30 mg  30 mg Oral QHS He, [name], MD   30 mg at 01/01/22 2157   OLANZapine (ZYPREXA) tablet 5 mg  5 mg Oral BH-q8a4p Parks Ranger, DO   5 mg at 01/02/22 0820   ondansetron (ZOFRAN) tablet 4 mg  4 mg Oral Q8H PRN He, [name], MD   4 mg at 12/27/21 0848   potassium chloride SA (KLOR-CON M) CR tablet 40 mEq  40 mEq Oral TID Suella Broad, FNP   40 mEq at 01/02/22 0925   ramelteon (ROZEREM) tablet 8 mg  8 mg Oral QHS Parks Ranger, DO   8 mg at 01/01/22 2157   tacrolimus (PROGRAF) capsule 0.5 mg  0.5 mg Oral [name], FNP   0.5 mg at 01/01/22 0931   tacrolimus (PROGRAF) capsule 1 mg  1 mg Oral [name], FNP   1 mg at 01/02/22 0926   temazepam (RESTORIL) capsule 30 mg  30 mg  Oral QHS Parks Ranger, DO   30 mg at 01/01/22 2157   torsemide (DEMADEX) tablet 100 mg  100 mg Oral Daily Suella Broad, FNP   100 mg at 01/02/22 0240   traZODone (DESYREL) tablet 100 mg  100 mg Oral QHS PRN Suella Broad,  FNP   100 mg at 01/02/22 0960   warfarin (COUMADIN) tablet 2 mg  2 mg Oral Once per day on Sun Tue Wed Thu Fri Sat Parks Ranger, DO       warfarin (COUMADIN) tablet 4 mg  4 mg Oral Q Mon-1800 Suella Broad, FNP   4 mg at 12/28/21 1701   Warfarin - Physician Dosing Inpatient   Does not apply q1600 [name], [facility]   Given at 01/01/22 1608    Lab Results: No results found for this or any previous visit (from the past 48 hour(s)).  Blood Alcohol level:  Lab Results  Component Value Date   ETH <10 12/24/2021   ETH <10 45/40/9811    Metabolic Disorder Labs: Lab Results  Component Value Date   HGBA1C 5.2 11/17/2021   MPG 102.54 11/17/2021   MPG 105.41 08/05/2018   No results found for: PROLACTIN Lab Results  Component Value Date   CHOL 117 11/17/2021   TRIG 96 11/17/2021   HDL 28 (L) 11/17/2021   CHOLHDL 4.2 11/17/2021   VLDL 19 11/17/2021   LDLCALC 70 11/17/2021   LDLCALC 87 08/22/2019    Physical Findings: AIMS:  , ,  ,  ,    CIWA:    COWS:     Musculoskeletal: Strength & Muscle Tone: within normal limits Gait & Station: normal Patient leans: N/A  Psychiatric Specialty Exam: Appearance:  CMJ, appearing stated age, appears well-nourished;  wearing appropriate clothes, with fair grooming and hygiene. Normal level of alertness and appropriate facial expression.  Attitude/Behavior: calm, cooperative, engaging with appropriate eye contact.  Motor: WNL; dyskinesias not evident.   Speech: spontaneous, clear, coherent, normal comprehension.  Mood: "okay".  Affect: appropriately-reactive, restricted.  Thought process: patient appears coherent, organized,  goal-directed.  Thought content: patient  denies suicidal thoughts, denies homicidal thoughts; did not express any delusions.  Thought perception: patient denies auditory and visual hallucinations. Did not appear internally stimulated.  Cognition: patient is alert and oriented in self, place, date.  Insight: fair, in regards of understanding of presence, nature, cause, and significance of mental or emotional problem.  Judgement: fair, in regards of ability to make good decisions concerning the appropriate thing to do in various situations, including ability to form opinions regarding their mental health condition.   Physical Exam: Physical Exam ROS Blood pressure 116/75, pulse 91, temperature 98.1 F (36.7 C), temperature source Oral, resp. rate 18, height 5\' 10"  (1.778 m), weight 81.2 kg, SpO2 96 %. Body mass index is 25.69 kg/m.   Treatment Plan Summary: Daily contact with patient to assess and evaluate symptoms and progress in treatment and Medication management Patient is a 71 year old male with the above-stated past psychiatric history who is seen in follow-up.  Chart reviewed. Patient discussed with nursing. Patient reports partial mood and sleep improvement after last adjustment of medications. No changes in medicine today. Patient will continue Ativan 1 mg as needed,  Restoril to 30 mg/day, Zyprexa 5 mg twice a day., Remeron 30 mg at bedtime, Rozerem 8 mg at bedtime.    Plan:  -continue inpatient psych admission; 15-minute checks; daily contact with patient to assess and evaluate symptoms and progress in treatment; psychoeducation.  -continue scheduled medications:  amiodarone  200 mg Oral Daily   atorvastatin  20 mg Oral QHS   dapagliflozin propanediol  10 mg Oral Daily   magnesium oxide  400 mg Oral BID   mirtazapine  30 mg Oral QHS   OLANZapine  5 mg Oral [id number]   potassium chloride SA  40 mEq Oral TID   ramelteon  8 mg Oral QHS   tacrolimus  0.5 mg Oral QODAY   tacrolimus  1 mg Oral QODAY   temazepam  30  mg Oral QHS   torsemide  100 mg Oral Daily   warfarin  2 mg Oral Once per day on Sun Tue Wed Thu Fri Sat   warfarin  4 mg Oral Q Mon-1800   Warfarin - Physician Dosing Inpatient   Does not apply q1600    -continue PRN medications.  acetaminophen, alum & mag hydroxide-simeth, LORazepam, magnesium hydroxide, ondansetron, traZODone  -Pertinent Labs: no new labs ordered today  -Consults: No new consults placed since yesterday    -Disposition: patient is not ready for d/c yet.  -  I certify that the patient does need, on a daily basis, active treatment furnished directly by or requiring the supervision of inpatient psychiatric facility personnel.    Larita Fife, MD 01/02/2022, 1:46 PM

## 2022-01-02 NOTE — Plan of Care (Signed)
°  Problem: Education: Goal: Utilization of techniques to improve thought processes will improve Outcome: Progressing Goal: Knowledge of the prescribed therapeutic regimen will improve Outcome: Progressing   Problem: Safety: Goal: Ability to disclose and discuss suicidal ideas will improve Outcome: Progressing Goal: Ability to identify and utilize support systems that promote safety will improve Outcome: Progressing   Problem: Self-Concept: Goal: Will verbalize positive feelings about self Outcome: Progressing Goal: Level of anxiety will decrease Outcome: Not Progressing  Pt continues to c/o of anxiety 10/10. PRN Ativan administered with good effect.

## 2022-01-03 DIAGNOSIS — F333 Major depressive disorder, recurrent, severe with psychotic symptoms: Secondary | ICD-10-CM | POA: Diagnosis not present

## 2022-01-03 MED ORDER — DAPAGLIFLOZIN PROPANEDIOL 5 MG PO TABS
10.0000 mg | ORAL_TABLET | Freq: Every day | ORAL | Status: DC
  Administered 2022-01-03 – 2022-01-08 (×6): 10 mg via ORAL
  Filled 2022-01-03 (×6): qty 2

## 2022-01-03 NOTE — Progress Notes (Signed)
Prn & F/U: At 0240, patient requested and received ativan 1 mg po prn for c/o anxiety. At reassessment, patient was noted resting comfortably in bed eye closed w/o any apparent distress.

## 2022-01-03 NOTE — Progress Notes (Signed)
[facility] MD Progress Note  01/03/2022 10:36 AM [name]  MRN:  [id number]  Principal Problem: MDD (major depressive disorder), recurrent episode ([place]) Diagnosis: Principal Problem:   MDD (major depressive disorder), recurrent episode ([place])  Patient is a  71y.o. male who presents to the [facility] unit due to worsened depression and anxiety.  Interval History Patient was seen today for re-evaluation.  Nursing reports no events overnight. The patient has no issues with performing ADLs.  Patient has been medication compliant.    Subjective:  On assessment patient reports "I am tired, had not slept well last night". Reports that he did not sleep well due to some noise in the hallway. He is trying to catch up with sleep today. Re[orts "okay" mood, continues to report partial improvement of anxiety on the current combination of medications. He is still depressed, but less. He is not suicidal in the hospital. He is concerned about his depression if discharged, so he is not ready yet.  Current suicidal/homicidal ideations: Denies Current auditory/visual hallucinations: Denies The patient reports no side effects from medications.    Labs: no new results for review.     Total Time spent with patient: 30 minutes  Past Psychiatric History: see H&P  Past Medical History:  Past Medical History:  Diagnosis Date   Atrial fibrillation ([place]) 08/2009   CAD (coronary artery disease)    s/p CABG -- 1992   Depression    ESRD (end stage renal disease) ([place])    HLD (hyperlipidemia)    HTN (hypertension)    Idiopathic thrombocytopenia (HCC)     Past Surgical History:  Procedure Laterality Date   APPENDECTOMY     CORONARY ARTERY BYPASS GRAFT     FACIAL COSMETIC SURGERY     GALLBLADDER SURGERY     HERNIA REPAIR     KIDNEY TRANSPLANT     LEFT HEART CATH AND CORONARY ANGIOGRAPHY N/A 08/07/2018   Procedure: LEFT HEART CATH AND CORONARY ANGIOGRAPHY;  Surgeon: Sherren Mocha, MD;  Location: [place] [facility];  Service: Cardiovascular;  Laterality: N/A;   LIVER TRANSPLANT     PRESSURE SENSOR/CARDIOMEMS N/A 08/04/2021   Procedure: PRESSURE SENSOR/CARDIOMEMS;  Surgeon: Larey Dresser, MD;  Location: [place] [facility];  Service: Cardiovascular;  Laterality: N/A;   RIGHT HEART CATH N/A 06/25/2021   Procedure: RIGHT HEART CATH;  Surgeon: Larey Dresser, MD;  Location: [place] [facility];  Service: Cardiovascular;  Laterality: N/A;   Family History:  Family History  Problem Relation Age of Onset   Atrial fibrillation Mother    Breast cancer Mother    Renal cancer Father    Hypertension Brother    Hyperlipidemia Brother    Other Brother        stent    Renal Disease Maternal Grandmother    Stroke Maternal Grandmother    Heart attack Maternal Grandfather    Cancer Paternal Grandmother    Heart failure Paternal Grandfather    Emphysema Paternal Grandfather    Bronchiolitis Paternal Grandfather    Family Psychiatric  History: see H&P  Social History:  Social History   Substance and Sexual Activity  Alcohol Use No     Social History   Substance and Sexual Activity  Drug Use No    Social History   Socioeconomic History   Marital status: Single    Spouse name: Not on file   Number of children: 0   Years of education: Not on file   Highest  education level: Not on file  Occupational History   Occupation: retired  Tobacco Use   Smoking status: Never   Smokeless tobacco: Never  Vaping Use   Vaping Use: Never used  Substance and Sexual Activity   Alcohol use: No   Drug use: No   Sexual activity: Not on file  Other Topics Concern   Not on file  Social History Narrative   Not on file   Social Determinants of Health   Financial Resource Strain: Low Risk    Difficulty of Paying Living Expenses: Not very hard  Food Insecurity: No Food Insecurity   Worried About Charity fundraiser in the Last Year: Never true   [place] in the Last Year: Never true   Transportation Needs: No Transportation Needs   Lack of Transportation (Medical): No   Lack of Transportation (Non-Medical): No  Physical Activity: Not on file  Stress: Not on file  Social Connections: Not on file   Additional Social History:                         Sleep: Fair  Appetite:  Fair  Current Medications: Current Facility-Administered Medications  Medication Dose Route Frequency Provider Last Rate Last Admin   acetaminophen (TYLENOL) tablet 650 mg  650 mg Oral Q6H PRN Suella Broad, FNP   650 mg at 01/03/22 0831   alum & mag hydroxide-simeth (MAALOX/MYLANTA) 200-200-20 MG/5ML suspension 30 mL  30 mL Oral Q4H PRN Suella Broad, FNP       amiodarone (PACERONE) tablet 200 mg  200 mg Oral Daily Suella Broad, FNP   200 mg at 01/03/22 0924   atorvastatin (LIPITOR) tablet 20 mg  20 mg Oral QHS Suella Broad, FNP   20 mg at 01/02/22 2205   dapagliflozin propanediol (FARXIGA) tablet 10 mg  10 mg Oral Daily Renda Rolls, RPH   10 mg at 01/03/22 0086   LORazepam (ATIVAN) tablet 1 mg  1 mg Oral Q4H PRN Parks Ranger, DO   1 mg at 01/03/22 7619   magnesium hydroxide (MILK OF MAGNESIA) suspension 30 mL  30 mL Oral Daily PRN Suella Broad, FNP       magnesium oxide (MAG-OX) tablet 400 mg  400 mg Oral BID Suella Broad, FNP   400 mg at 01/03/22 0924   mirtazapine (REMERON) tablet 30 mg  30 mg Oral QHS He, [name], MD   30 mg at 01/02/22 2205   OLANZapine (ZYPREXA) tablet 5 mg  5 mg Oral BH-q8a4p Parks Ranger, DO   5 mg at 01/03/22 0828   ondansetron (ZOFRAN) tablet 4 mg  4 mg Oral Q8H PRN He, [name], MD   4 mg at 12/27/21 0848   potassium chloride SA (KLOR-CON M) CR tablet 40 mEq  40 mEq Oral TID Suella Broad, FNP   40 mEq at 01/03/22 0925   ramelteon (ROZEREM) tablet 8 mg  8 mg Oral QHS Parks Ranger, DO   8 mg at 01/02/22 2205   tacrolimus (PROGRAF) capsule 0.5 mg  0.5 mg Oral [name], FNP   0.5 mg at 01/03/22 0924   tacrolimus (PROGRAF) capsule 1 mg  1 mg Oral [name], FNP   1 mg at 01/02/22 0926   temazepam (RESTORIL) capsule 30 mg  30 mg Oral QHS Parks Ranger, DO   30 mg at 01/02/22 2204  torsemide (DEMADEX) tablet 100 mg  100 mg Oral Daily Suella Broad, FNP   100 mg at 01/03/22 8115   traZODone (DESYREL) tablet 100 mg  100 mg Oral QHS PRN Suella Broad, FNP   100 mg at 01/02/22 7262   warfarin (COUMADIN) tablet 2 mg  2 mg Oral Once per day on Sun Tue Wed Thu Fri Sat Parks Ranger, DO   2 mg at 01/02/22 1704   warfarin (COUMADIN) tablet 4 mg  4 mg Oral Q Mon-1800 Suella Broad, FNP   4 mg at 12/28/21 1701   Warfarin - Physician Dosing Inpatient   Does not apply q1600 [name], [facility]   Given at 01/01/22 1608    Lab Results: No results found for this or any previous visit (from the past 48 hour(s)).  Blood Alcohol level:  Lab Results  Component Value Date   ETH <10 12/24/2021   ETH <10 03/55/9741    Metabolic Disorder Labs: Lab Results  Component Value Date   HGBA1C 5.2 11/17/2021   MPG 102.54 11/17/2021   MPG 105.41 08/05/2018   No results found for: PROLACTIN Lab Results  Component Value Date   CHOL 117 11/17/2021   TRIG 96 11/17/2021   HDL 28 (L) 11/17/2021   CHOLHDL 4.2 11/17/2021   VLDL 19 11/17/2021   LDLCALC 70 11/17/2021   LDLCALC 87 08/22/2019    Physical Findings: AIMS:  , ,  ,  ,    CIWA:    COWS:     Musculoskeletal: Strength & Muscle Tone: within normal limits Gait & Station: normal Patient leans: N/A  Psychiatric Specialty Exam: Appearance:  CMJ, appearing stated age, appears well-nourished;  wearing appropriate clothes, with fair grooming and hygiene. Normal level of alertness and appropriate facial expression.  Attitude/Behavior: calm, cooperative, engaging with appropriate eye contact.  Motor: WNL; dyskinesias not evident.    Speech: spontaneous, clear, coherent, normal comprehension.  Mood: "okay".  Affect: appropriately-reactive, restricted.  Thought process: patient appears coherent, organized,  goal-directed.  Thought content: patient denies suicidal thoughts, denies homicidal thoughts; did not express any delusions.  Thought perception: patient denies auditory and visual hallucinations. Did not appear internally stimulated.  Cognition: patient is alert and oriented in self, place, date.  Insight: fair, in regards of understanding of presence, nature, cause, and significance of mental or emotional problem.  Judgement: fair, in regards of ability to make good decisions concerning the appropriate thing to do in various situations, including ability to form opinions regarding their mental health condition.   Physical Exam: Physical Exam ROS Blood pressure 97/63, pulse 74, temperature (!) 97.4 F (36.3 C), temperature source Oral, resp. rate 18, height 5\' 10"  (1.778 m), weight 81.2 kg, SpO2 100 %. Body mass index is 25.69 kg/m.   Treatment Plan Summary: Daily contact with patient to assess and evaluate symptoms and progress in treatment and Medication management Patient is a 71 year old male with the above-stated past psychiatric history who is seen in follow-up.  Chart reviewed. Patient discussed with nursing. Patient reports partial mood and sleep improvement after last adjustment of medications. No changes in medicine today. Patient will continue Ativan 1 mg as needed,  Restoril to 30 mg/day, Zyprexa 5 mg twice a day., Remeron 30 mg at bedtime, Rozerem 8 mg at bedtime.    Plan:  -continue inpatient psych admission; 15-minute checks; daily contact with patient to assess and evaluate symptoms and progress in treatment; psychoeducation.  -continue scheduled medications:  amiodarone  200 mg Oral Daily   atorvastatin  20 mg Oral QHS   dapagliflozin propanediol  10 mg Oral Daily   magnesium oxide   400 mg Oral BID   mirtazapine  30 mg Oral QHS   OLANZapine  5 mg Oral BH-q8a4p   potassium chloride SA  40 mEq Oral TID   ramelteon  8 mg Oral QHS   tacrolimus  0.5 mg Oral QODAY   tacrolimus  1 mg Oral QODAY   temazepam  30 mg Oral QHS   torsemide  100 mg Oral Daily   warfarin  2 mg Oral Once per day on Sun Tue Wed Thu Fri Sat   warfarin  4 mg Oral Q Mon-1800   Warfarin - Physician Dosing Inpatient   Does not apply q1600    -continue PRN medications.  acetaminophen, alum & mag hydroxide-simeth, LORazepam, magnesium hydroxide, ondansetron, traZODone  -Pertinent Labs: no new labs ordered today  -Consults: No new consults placed since yesterday    -Disposition: patient is not ready for d/c yet.  -  I certify that the patient does need, on a daily basis, active treatment furnished directly by or requiring the supervision of inpatient psychiatric facility personnel.    Larita Fife, MD 01/03/2022, 10:36 AM

## 2022-01-03 NOTE — Progress Notes (Signed)
Patient denies SI, HI, and AVH. He rates depression as a 4/10 and anxiety as a 5/10. Patient says he did not sleep last night despite taking Ativan in the middle of the night. He came out to the dayroom for breakfast and returned to his room afterwards. Patient is compliant with scheduled medications. Support and encouragement provided. Patient remains safe on the unit at this time.

## 2022-01-03 NOTE — Progress Notes (Signed)
D: Pt alert and oriented. Pt rates his anxiety/depression 8/10 at this time. Pt denies experiencing any pain at this time. Pt denies experiencing any SI/HI, or AVH at this time.  A: Scheduled medications administered to patient, per MD orders. Support and encouragement provided. Routine safety checks conducted q15 minutes.  R: No adverse drug reactions noted. Pt verbally contracts for safety at this time. Pt complaint with medications. Pt interacts minimally with others on the unit. Pt remains safe at this time. Will continue to monitor.

## 2022-01-03 NOTE — Group Note (Signed)
LCSW Group Therapy Note  Group Date: 01/03/2022 Start Time: 1300 End Time: 1400   Type of Therapy and Topic:  Group Therapy - Healthy vs Unhealthy Coping Skills  Participation Level:  None   Description of Group The focus of this group was to determine what unhealthy coping techniques typically are used by group members and what healthy coping techniques would be helpful in coping with various problems. Patients were guided in becoming aware of the differences between healthy and unhealthy coping techniques. Patients were asked to identify 2-3 healthy coping skills they would like to learn to use more effectively.  Therapeutic Goals Patients learned that coping is what human beings do all day long to deal with various situations in their lives Patients defined and discussed healthy vs unhealthy coping techniques Patients identified their preferred coping techniques and identified whether these were healthy or unhealthy Patients determined 2-3 healthy coping skills they would like to become more familiar with and use more often. Patients provided support and ideas to each other   Summary of Patient Progress: Patient presented to group late, stayed for rough 5-10 minutes and left. Did not contribute to topic of discussion.    Therapeutic Modalities Cognitive Behavioral Therapy Motivational Interviewing  Larose Kells 01/03/2022  2:15 PM

## 2022-01-04 DIAGNOSIS — F333 Major depressive disorder, recurrent, severe with psychotic symptoms: Secondary | ICD-10-CM | POA: Diagnosis not present

## 2022-01-04 LAB — BASIC METABOLIC PANEL
Anion gap: 10 (ref 5–15)
BUN: 18 mg/dL (ref 8–23)
CO2: 27 mmol/L (ref 22–32)
Calcium: 9.1 mg/dL (ref 8.9–10.3)
Chloride: 105 mmol/L (ref 98–111)
Creatinine, Ser: 2.78 mg/dL — ABNORMAL HIGH (ref 0.61–1.24)
GFR, Estimated: 24 mL/min — ABNORMAL LOW (ref >60)
Glucose, Bld: 96 mg/dL (ref 70–99)
Potassium: 5.3 mmol/L — ABNORMAL HIGH (ref 3.5–5.1)
Sodium: 142 mmol/L (ref 135–145)

## 2022-01-04 LAB — PROTIME-INR
INR: 2.9 — ABNORMAL HIGH (ref 0.8–1.2)
Prothrombin Time: 30.4 seconds — ABNORMAL HIGH (ref 11.4–15.2)

## 2022-01-04 MED ORDER — OLANZAPINE 5 MG PO TABS
15.0000 mg | ORAL_TABLET | Freq: Every day | ORAL | Status: DC
  Administered 2022-01-05 – 2022-01-21 (×17): 15 mg via ORAL
  Filled 2022-01-04 (×17): qty 3

## 2022-01-04 MED ORDER — VENLAFAXINE HCL ER 37.5 MG PO CP24
37.5000 mg | ORAL_CAPSULE | Freq: Every day | ORAL | Status: DC
  Administered 2022-01-05: 37.5 mg via ORAL
  Filled 2022-01-04: qty 1

## 2022-01-04 MED ORDER — LORAZEPAM 1 MG PO TABS
1.0000 mg | ORAL_TABLET | ORAL | Status: DC | PRN
  Administered 2022-01-04 – 2022-01-08 (×7): 1 mg via ORAL
  Filled 2022-01-04 (×7): qty 1

## 2022-01-04 NOTE — Progress Notes (Signed)
Recreation Therapy Notes   Date: 01/04/2022  Time: 1:30 pm    Location: Craft room    Behavioral response: Appropriate  Intervention Topic:  Stress Management    Discussion/Intervention:  Group content on today was focused on stress. The group defined stress and way to cope with stress. Participants expressed how they know when they are stresses out. Individuals described the different ways they have to cope with stress. The group stated reasons why it is important to cope with stress. Patient explained what good stress is and some examples. The group participated in the intervention Stress Management. Individuals were separated into two group and answered questions related to stress.   Clinical Observations/Feedback: Patient came to group and defined stress as dealing with things we have no control over. He identified deep breathing as a stress management technique he uses.Individual was social with peers and staff while participating in the intervention.   Cincere Deprey LRT/CTRS         Daden Mahany 01/04/2022 3:15 PM

## 2022-01-04 NOTE — Progress Notes (Signed)
[facility] MD Progress Note  01/04/2022 12:28 PM [name]  MRN:  [id number] Subjective: [name] continues to have trouble with sleeping.  He does admit that he dozes off in the daytime.  He informs me that he was set up for sleep study before his last admission.  He still has a lot of depression and anxiety and I told him that we will make some changes to see if we can get him feeling better even though his perception of sleep is poor and he is agreed to do a sleep study outpatient.  He states that was set up at [facility] pulmonology.  We will see if we can get an appointment set up for him.  In the meantime see plan below.  Principal Problem: MDD (major depressive disorder), recurrent episode ([place]) Diagnosis: Principal Problem:   MDD (major depressive disorder), recurrent episode ([place])  Total Time spent with patient: 15 minutes  Past Psychiatric History: [facility]  Past Medical History:  Past Medical History:  Diagnosis Date   Atrial fibrillation ([place]) 08/2009   CAD (coronary artery disease)    s/p CABG -- 1992   Depression    ESRD (end stage renal disease) ([place])    HLD (hyperlipidemia)    HTN (hypertension)    Idiopathic thrombocytopenia (H[facility])     Past Surgical History:  Procedure Laterality Date   APPENDECTOMY     CORONARY ARTERY BYPASS GRAFT     FACIAL COSMETIC SURGERY     GALLBLADDER SURGERY     HERNIA REPAIR     KIDNEY TRANSPLANT     LEFT HEART CATH AND CORONARY ANGIOGRAPHY N/A 08/07/2018   Procedure: LEFT HEART CATH AND CORONARY ANGIOGRAPHY;  Surgeon: Sherren Mocha, MD;  Location: [place] CV LAB;  Service: Cardiovascular;  Laterality: N/A;   LIVER TRANSPLANT     PRESSURE SENSOR/CARDIOMEMS N/A 08/04/2021   Procedure: PRESSURE SENSOR/CARDIOMEMS;  Surgeon: Larey Dresser, MD;  Location: [place] CV LAB;  Service: Cardiovascular;  Laterality: N/A;   RIGHT HEART CATH N/A 06/25/2021   Procedure: RIGHT HEART CATH;  Surgeon: Larey Dresser, MD;  Location: [place]  CV LAB;  Service: Cardiovascular;  Laterality: N/A;   Family History:  Family History  Problem Relation Age of Onset   Atrial fibrillation Mother    Breast cancer Mother    Renal cancer Father    Hypertension Brother    Hyperlipidemia Brother    Other Brother        stent    Renal Disease Maternal Grandmother    Stroke Maternal Grandmother    Heart attack Maternal Grandfather    Cancer Paternal Grandmother    Heart failure Paternal Grandfather    Emphysema Paternal Grandfather    Bronchiolitis Paternal Grandfather     Social History:  Social History   Substance and Sexual Activity  Alcohol Use No     Social History   Substance and Sexual Activity  Drug Use No    Social History   Socioeconomic History   Marital status: Single    Spouse name: Not on file   Number of children: 0   Years of education: Not on file   Highest education level: Not on file  Occupational History   Occupation: retired  Tobacco Use   Smoking status: Never   Smokeless tobacco: Never  Vaping Use   Vaping Use: Never used  Substance and Sexual Activity   Alcohol use: No   Drug use: No   Sexual activity: Not  on file  Other Topics Concern   Not on file  Social History Narrative   Not on file   Social Determinants of Health   Financial Resource Strain: Low Risk    Difficulty of Paying Living Expenses: Not very hard  Food Insecurity: No Food Insecurity   Worried About Running Out of Food in the Last Year: Never true   Ran Out of Food in the Last Year: Never true  Transportation Needs: No Transportation Needs   Lack of Transportation (Medical): No   Lack of Transportation (Non-Medical): No  Physical Activity: Not on file  Stress: Not on file  Social Connections: Not on file   Additional Social History:                         Sleep: Poor  Appetite:  Fair  Current Medications: Current Facility-Administered Medications  Medication Dose Route Frequency Provider Last  Rate Last Admin   acetaminophen (TYLENOL) tablet 650 mg  650 mg Oral Q6H PRN Suella Broad, FNP   650 mg at 01/04/22 0749   alum & mag hydroxide-simeth (MAALOX/MYLANTA) 200-200-20 MG/5ML suspension 30 mL  30 mL Oral Q4H PRN Suella Broad, FNP       amiodarone (PACERONE) tablet 200 mg  200 mg Oral Daily Suella Broad, FNP   200 mg at 01/04/22 0957   atorvastatin (LIPITOR) tablet 20 mg  20 mg Oral QHS Suella Broad, FNP   20 mg at 01/03/22 2226   dapagliflozin propanediol (FARXIGA) tablet 10 mg  10 mg Oral Daily Renda Rolls, RPH   10 mg at 01/04/22 0957   LORazepam (ATIVAN) tablet 1 mg  1 mg Oral Q4H PRN Parks Ranger, DO       magnesium hydroxide (MILK OF MAGNESIA) suspension 30 mL  30 mL Oral Daily PRN Starkes-Perry, Gayland Curry, FNP       magnesium oxide (MAG-OX) tablet 400 mg  400 mg Oral BID Suella Broad, FNP   400 mg at 01/04/22 0957   [START ON 01/05/2022] OLANZapine (ZYPREXA) tablet 15 mg  15 mg Oral QHS Parks Ranger, DO       ondansetron [facility]) tablet 4 mg  4 mg Oral Q8H PRN He, [name], MD   4 mg at 12/27/21 0848   potassium chloride SA (KLOR-CON M) CR tablet 40 mEq  40 mEq Oral TID Suella Broad, FNP   40 mEq at 01/04/22 0956   tacrolimus (PROGRAF) capsule 0.5 mg  0.5 mg Oral [name], FNP   0.5 mg at 01/03/22 0924   tacrolimus (PROGRAF) capsule 1 mg  1 mg Oral [name], FNP   1 mg at 01/04/22 0956   temazepam (RESTORIL) capsule 30 mg  30 mg Oral QHS Parks Ranger, DO   30 mg at 01/03/22 2227   torsemide (DEMADEX) tablet 100 mg  100 mg Oral Daily Suella Broad, FNP   100 mg at 01/04/22 0956   [START ON 01/05/2022] venlafaxine XR (EFFEXOR-XR) 24 hr capsule 37.5 mg  37.5 mg Oral Q breakfast Parks Ranger, DO       warfarin (COUMADIN) tablet 2 mg  2 mg Oral Once per day on Sun Tue Wed Thu Fri Sat Parks Ranger, DO   2 mg at 01/03/22 1709    warfarin (COUMADIN) tablet 4 mg  4 mg Oral Q Mon-1800 Starkes-Perry, Gayland Curry, FNP   4 mg  at 12/28/21 1701   Warfarin - Physician Dosing Inpatient   Does not apply q1600 [name], [facility]   Given at 01/03/22 1709    Lab Results:  Results for orders placed or performed during the hospital encounter of 12/25/21 (from the past 48 hour(s))  Protime-INR     Status: Abnormal   Collection Time: 01/04/22  7:14 AM  Result Value Ref Range   Prothrombin Time 30.4 (H) 11.4 - 15.2 seconds   INR 2.9 (H) 0.8 - 1.2    Comment: (NOTE) INR goal varies based on device and disease states. Performed at [facility], [address] [place], [place] [address]   Basic metabolic panel     Status: Abnormal   Collection Time: 01/04/22  7:14 AM  Result Value Ref Range   Sodium 142 135 - 145 mmol/L    Comment: Performed at [facility], [address] [place], [place] [address]   Potassium 5.3 (H) 3.5 - 5.1 mmol/L    Comment: Performed at [facility], [place], [place] [address]   Chloride 105 98 - 111 mmol/L    Comment: Performed at [facility], [place], [place] [address]   CO2 27 22 - 32 mmol/L    Comment: Performed at [facility], [address] [place], [place] [address]   Glucose, Bld 96 70 - 99 mg/dL    Comment: Glucose reference range applies only to samples taken after fasting for at least 8 hours. Performed at [facility], [address] [place], [place] [address]    BUN 18 8 - 23 mg/dL   Creatinine, Ser 2.78 (H) 0.61 - 1.24 mg/dL   Calcium 9.1 8.9 - 10.3 mg/dL   GFR, Estimated 24 (L) >60 mL/min    Comment: (NOTE) Calculated using the CKD-EPI Creatinine Equation (2021)    Anion gap 10 5 - 15    Comment: Performed at [facility], [address] [place], [place] [address]    Blood Alcohol level:  Lab Results  Component Value Date   [facility] <10 12/24/2021   ETH <10 67/59/1638     Metabolic Disorder Labs: Lab Results  Component Value Date   HGBA1C 5.2 11/17/2021   MPG 102.54 11/17/2021   MPG 105.41 08/05/2018   No results found for: PROLACTIN Lab Results  Component Value Date   CHOL 117 11/17/2021   TRIG 96 11/17/2021   HDL 28 (L) 11/17/2021   CHOLHDL 4.2 11/17/2021   VLDL 19 11/17/2021   LDLCALC 70 11/17/2021   LDLCALC 87 08/22/2019    Physical Findings: AIMS:  , ,  ,  ,    CIWA:    COWS:     Musculoskeletal: Strength & Muscle Tone: within normal limits Gait & Station: normal Patient leans: N/A  Psychiatric Specialty Exam:  Presentation  General Appearance: No data recorded Eye Contact:No data recorded Speech:No data recorded Speech Volume:No data recorded Handedness:No data recorded  Mood and Affect  Mood:No data recorded Affect:No data recorded  Thought Process  Thought Processes:No data recorded Descriptions of Associations:No data recorded Orientation:No data recorded Thought Content:No data recorded History of Schizophrenia/Schizoaffective disorder:No  Duration of Psychotic Symptoms:No data recorded Hallucinations:No data recorded Ideas of Reference:No data recorded Suicidal Thoughts:No data recorded Homicidal Thoughts:No data recorded  Sensorium  Memory:No data recorded Judgment:No data recorded Insight:No data recorded  Executive Functions  Concentration:No data recorded Attention Span:No data recorded Recall:No data recorded Fund of Knowledge:No  data recorded Language:No data recorded  Psychomotor Activity  Psychomotor Activity:No data recorded  Assets  Assets:No data recorded  Sleep  Sleep:No data recorded   Physical Exam: Physical Exam Vitals and nursing note reviewed.  Constitutional:      Appearance: Normal appearance. He is normal weight.  Neurological:     General: No focal deficit present.     Mental Status: He is alert and oriented to person, place, and time.  Psychiatric:         Attention and Perception: Attention normal.        Mood and Affect: Mood is anxious and depressed.        Speech: Speech normal.        Behavior: Behavior normal. Behavior is cooperative.        Thought Content: Thought content normal.        Cognition and Memory: Cognition and memory normal.        Judgment: Judgment normal.   Review of Systems  Constitutional: Negative.   HENT: Negative.    Eyes: Negative.   Respiratory: Negative.    Cardiovascular: Negative.   Gastrointestinal: Negative.   Genitourinary: Negative.   Musculoskeletal: Negative.   Skin: Negative.   Neurological: Negative.   Endo/Heme/Allergies: Negative.   Psychiatric/Behavioral:  Positive for depression. The patient is nervous/anxious and has insomnia.   Blood pressure 102/60, pulse 81, temperature (!) 97.5 F (36.4 C), temperature source Oral, resp. rate 18, height 5\' 10"  (1.778 m), weight 81.2 kg, SpO2 94 %. Body mass index is 25.69 kg/m.   Treatment Plan Summary: Daily contact with patient to assess and evaluate symptoms and progress in treatment, Medication management, and Plan discontinue Remeron and start Effexor XR in the morning.  Discontinue Rozerem because it is not working.  Change Zyprexa to bedtime and increase to 15 mg.  Continue Restoril at bedtime.  Discontinue trazodone due to QT side effects and it is not working anyway.  Parks Ranger, DO 01/04/2022, 12:28 PM

## 2022-01-04 NOTE — Plan of Care (Signed)
Patient remains alert and oriented with flat affect and sad mood. Verbalized anxiety 6/10, depression 5/10 and pain 6/10. Denies SI, HI, and AVH. Patient stated he did not sleep well. Compliant with medications. Ate breakfast in the day room among peers with good appetite. Remain safe on the unit with Q 15 minute safety checks.  Problem: Education: Goal: Knowledge of  General Education information/materials will improve Outcome: Progressing Goal: Emotional status will improve Outcome: Progressing Goal: Mental status will improve Outcome: Progressing Goal: Verbalization of understanding the information provided will improve Outcome: Progressing   Problem: Activity: Goal: Interest or engagement in activities will improve Outcome: Progressing Goal: Sleeping patterns will improve Outcome: Progressing   Problem: Coping: Goal: Ability to verbalize frustrations and anger appropriately will improve Outcome: Progressing Goal: Ability to demonstrate self-control will improve Outcome: Progressing   Problem: Health Behavior/Discharge Planning: Goal: Identification of resources available to assist in meeting health care needs will improve Outcome: Progressing Goal: Compliance with treatment plan for underlying cause of condition will improve Outcome: Progressing   Problem: Physical Regulation: Goal: Ability to maintain clinical measurements within normal limits will improve Outcome: Progressing   Problem: Safety: Goal: Periods of time without injury will increase Outcome: Progressing   Problem: Education: Goal: Utilization of techniques to improve thought processes will improve Outcome: Progressing Goal: Knowledge of the prescribed therapeutic regimen will improve Outcome: Progressing   Problem: Activity: Goal: Interest or engagement in leisure activities will improve Outcome: Progressing Goal: Imbalance in normal sleep/wake cycle will improve Outcome: Progressing    Problem: Coping: Goal: Coping ability will improve Outcome: Progressing Goal: Will verbalize feelings Outcome: Progressing   Problem: Health Behavior/Discharge Planning: Goal: Ability to make decisions will improve Outcome: Progressing Goal: Compliance with therapeutic regimen will improve Outcome: Progressing   Problem: Role Relationship: Goal: Will demonstrate positive changes in social behaviors and relationships Outcome: Progressing   Problem: Safety: Goal: Ability to disclose and discuss suicidal ideas will improve Outcome: Progressing Goal: Ability to identify and utilize support systems that promote safety will improve Outcome: Progressing   Problem: Self-Concept: Goal: Will verbalize positive feelings about self Outcome: Progressing Goal: Level of anxiety will decrease Outcome: Progressing   Problem: Education: Goal: Ability to make informed decisions regarding treatment will improve Outcome: Progressing   Problem: Coping: Goal: Coping ability will improve Outcome: Progressing   Problem: Health Behavior/Discharge Planning: Goal: Identification of resources available to assist in meeting health care needs will improve Outcome: Progressing   Problem: Medication: Goal: Compliance with prescribed medication regimen will improve Outcome: Progressing   Problem: Self-Concept: Goal: Ability to disclose and discuss suicidal ideas will improve Outcome: Progressing Goal: Will verbalize positive feelings about self Outcome: Progressing   Problem: Education: Goal: Ability to state activities that reduce stress will improve Outcome: Progressing   Problem: Coping: Goal: Ability to identify and develop effective coping behavior will improve Outcome: Progressing   Problem: Self-Concept: Goal: Ability to identify factors that promote anxiety will improve Outcome: Progressing Goal: Level of anxiety will decrease Outcome: Progressing Goal: Ability to modify  response to factors that promote anxiety will improve Outcome: Progressing

## 2022-01-04 NOTE — Progress Notes (Signed)
D: Pt alert and oriented. Pt rated his anxiety/depression 9/10 at time of assessment. Pt denies experiencing any pain at this time. Pt denies experiencing any SI/HI, or AVH at this time.   A: Scheduled medications administered to patient, per MD orders. Trazodone 100mg  po prn offered as request at 0300 for c/o insomnia w/o effect. Support and encouragement provided. Routine safety checks conducted q15 minutes.  R: No adverse drug reactions noted. Pt verbally contracts for safety at this time. Pt complaint with medications. Pt interacted appropriately with others on the unit. Pt remains safe at this time. Will continue to monitor.

## 2022-01-04 NOTE — Progress Notes (Addendum)
° °  [place] Group Notes:  (Nursing/MHT/Case Management/Adjunct)  Date:  01/04/2022  Time:  10:00 AM  Type of Therapy:  Group Therapy  Participation Level:  Minimal  Participation Quality:  Drowsy and Sharing  Affect:  Flat  Cognitive:  Oriented  Insight:  Limited  Engagement in Group:  Limited  Modes of Intervention:  Discussion  Summary of Progress/Problems:  PT was present at group , pt was not very engaged nor communicative, was falling asleep   Juliette Alcide 01/04/2022, 2:31 PM

## 2022-01-05 DIAGNOSIS — F333 Major depressive disorder, recurrent, severe with psychotic symptoms: Secondary | ICD-10-CM | POA: Diagnosis not present

## 2022-01-05 LAB — PROTIME-INR
INR: 3.2 — ABNORMAL HIGH (ref 0.8–1.2)
INR: 3.2 — AB (ref 0.80–1.20)
Prothrombin Time: 32.3 seconds — ABNORMAL HIGH (ref 11.4–15.2)

## 2022-01-05 MED ORDER — TORSEMIDE 20 MG PO TABS
60.0000 mg | ORAL_TABLET | Freq: Every day | ORAL | Status: DC
  Administered 2022-01-06 – 2022-01-08 (×3): 60 mg via ORAL
  Filled 2022-01-05 (×3): qty 3

## 2022-01-05 MED ORDER — POTASSIUM CHLORIDE CRYS ER 20 MEQ PO TBCR
40.0000 meq | EXTENDED_RELEASE_TABLET | Freq: Every day | ORAL | Status: DC
  Administered 2022-01-06 – 2022-01-08 (×3): 40 meq via ORAL
  Filled 2022-01-05 (×3): qty 2

## 2022-01-05 MED ORDER — VENLAFAXINE HCL ER 75 MG PO CP24
75.0000 mg | ORAL_CAPSULE | Freq: Every day | ORAL | Status: DC
  Administered 2022-01-06 – 2022-01-07 (×2): 75 mg via ORAL
  Filled 2022-01-05 (×2): qty 1

## 2022-01-05 NOTE — [facility] Group Notes (Signed)
[place] Group Notes:  (Nursing/MHT/Case Management/Adjunct)  Date:  01/05/2022  Time:  10:00 AM  Type of Therapy:  Group Therapy  Participation Level:  Minimal  Participation Quality:  Drowsy, Inattentive, and Redirectable  Affect:  Depressed and Flat  Cognitive:  Lacking  Insight:  None  Engagement in Group:  Lacking, Limited, and Poor  Modes of Intervention:  Discussion  Summary of Progress/Problems: Pt. Was not very engaged in group. He participated in activity for a few minutes before laying head down for remainder of group. Juliette Alcide 01/05/2022, 12:46 PM

## 2022-01-05 NOTE — Plan of Care (Signed)
Patient is alert and oriented times 4. Mood and affect sullen/flat. Patient lays in bed all day- gets up for meals. Patient denies pain at this time. He denies SI, HI, and AVH. Also denies feelings of anxiety and depression at this time. States he did not sleep too bad last night. He s/p day 1 for a fall with no injury. Morning meds given whole by mouth W/O difficulty. Ate breakfast in day room- appetite good. Patient remains on unit with Q15 minute checks in place.    Problem: Education: Goal: Knowledge of [facility] Education information/materials will improve Outcome: Progressing Goal: Emotional status will improve Outcome: Progressing Goal: Mental status will improve Outcome: Progressing Goal: Verbalization of understanding the information provided will improve Outcome: Progressing

## 2022-01-05 NOTE — Plan of Care (Signed)
°  Problem: Education: Goal: Emotional status will improve Outcome: Not Progressing   Problem: Safety: Goal: Periods of time without injury will increase Outcome: Not Progressing

## 2022-01-05 NOTE — Group Note (Signed)
[facility] LCSW Group Therapy Note    Group Date: 01/05/2022 Start Time: 1430 End Time: 1500  Type of Therapy and Topic:  Group Therapy:  Overcoming Obstacles  Participation Level:  [facility] PARTICIPATION LEVEL: Minimal   Description of Group:   In this group patients will be encouraged to explore what they see as obstacles to their own wellness and recovery. They will be guided to discuss their thoughts, feelings, and behaviors related to these obstacles. The group will process together ways to cope with barriers, with attention given to specific choices patients can make. Each patient will be challenged to identify changes they are motivated to make in order to overcome their obstacles. This group will be process-oriented, with patients participating in exploration of their own experiences as well as giving and receiving support and challenge from other group members.  Therapeutic Goals: 1. Patient will identify personal and current obstacles as they relate to admission. 2. Patient will identify barriers that currently interfere with their wellness or overcoming obstacles.  3. Patient will identify feelings, thought process and behaviors related to these barriers. 4. Patient will identify two changes they are willing to make to overcome these obstacles:    Summary of Patient Progress   Patient was present for the entirety of the session. Patient was an active listener and participated in the topic of discussion, and provided insightful thoughts. He stated that he was still feeling very anxious due to his lack of sleep. He said he was anxious that he'll never be able to sleep again and that fear causes him an increased inability to sleep. He said that upon leaving he would be interested in resources for community gatherings citing that fact that he enjoyed hanging outside in the courtyard engaging with other members.     Therapeutic Modalities:    Cognitive Behavioral Therapy Solution Focused Therapy Motivational Interviewing Relapse Prevention Therapy   Lyris Hitchman A [place], LCSWA

## 2022-01-05 NOTE — Progress Notes (Addendum)
°   01/05/22 0214  What Happened  Was fall witnessed? No  Was patient injured? No  Patient found other (Comment) (Patient walked to nurses station, stated he fell)  Found by No one-pt stated they fell  Stated prior activity ambulating-unassisted  Follow Up  MD notified yes  Time MD notified 0230  Family notified No - patient refusal  Additional tests No  Simple treatment Other (comment) (reposition, rest)  Progress note created (see row info) Yes  Adult Fall Risk Assessment  Risk Factor Category (scoring not indicated) Fall has occurred during this admission (document High fall risk)  Age 71  Fall History: Fall within 6 months prior to admission 0  Elimination; Bowel and/or Urine Incontinence 0  Elimination; Bowel and/or Urine Urgency/Frequency 0  Medications: includes PCA/Opiates, Anti-convulsants, Anti-hypertensives, Diuretics, Hypnotics, Laxatives, Sedatives, and Psychotropics 5  Patient Care Equipment 0  Mobility-Assistance 0  Mobility-Gait 0  Mobility-Sensory Deficit 0  Altered awareness of immediate physical environment 0  Impulsiveness 0  Lack of understanding of one's physical/cognitive limitations 0  Total Score 6  Patient Fall Risk Level High fall risk  Adult Fall Risk Interventions  Required Bundle Interventions *See Row Information* High fall risk - low, moderate, and high requirements implemented   Patient presented to nurse stating he had fell in room. Patient Alert and orient x 4, able to follow all commands, bilateral grips equal. Patient states he was ambulating in  his room and fell on his butt.  Denies any head injury, states he did not his his head.  Complains of pain to hip, reports its the  same hip he has been having for some time now. Patient requested tylenol for pain. No redness, bruising or swelling  noted to hip, or any other areas, all in proper alignment. COmplete ROM  all extremities. Able to move all extremities. Vital signs checked, B/P with low  readings as noted throughout hospital stay. Writer and staff assessed toileting needs, advised patient to call staff prior to ambulating. Patient given tylenol as requested for pain, states he is fine at this time and will call for assistance as needed. Provider R. Lovena Le, [place] notified of call as well as AC. Patient currently in bed resting.

## 2022-01-05 NOTE — Progress Notes (Signed)
Patient currently in bed resting, eyes closed in no distress. Denies SI, HI, aVH. Endorses depression and anxiety at 9. Prn for anxiety given with good relief. No other concerns or complaints voiced.

## 2022-01-05 NOTE — Progress Notes (Signed)
Recreation Therapy Notes   Date: 01/05/2022   Time: 1:20pm    Location: Courtyard     Behavioral response: Appropriate   Intervention Topic:  Social skills     Discussion/Intervention:  Group content on today was focused on social skills. The group defined social skills and identified ways they use social skills. Patients expressed what obstacles they face when trying to be social. Participants described the importance of social skills. The group listed ways to improve social skills and reasons to improve social skills. Individuals had an opportunity to learn new and improve social skills as well as identify their weaknesses.   Clinical Observations/Feedback: Patient came to group and was able to identify social skills and how they participate in socialization. Individual was social with peers and staff while participating in the intervention.   Orestes Geiman LRT/CTRS          Tillman Kazmierski 01/05/2022 2:52 PM

## 2022-01-05 NOTE — Progress Notes (Signed)
Pt sitting on edge of bed with walker nearby; calm, cooperative. Pt states "I'm lightheaded." Pt educated on restoril hypotension side effect and was informed that the doctor wants it held tonight. He denies pain and SI/HI/AVH at this time. When asked how he slept last night, he states "I didn't. I been having trouble since I came here." He says that he has trouble falling and staying asleep. He describes his appetite as "okay". He expresses anxiety, which he rates 8/10, due to "I can't sleep" and depression, which he rates 6-7/10, due to "being in the hospital". No acute distress noted.

## 2022-01-05 NOTE — Progress Notes (Signed)
MD notified of fall and gave orders to hold Restoril tonight. Oncoming nurse aware.

## 2022-01-05 NOTE — Progress Notes (Signed)
Patient continues to have low blood pressure, asymptomatic. Currently denies pain. Able to follow commands, able to move all extremities. Toileting and fluids offered and declined. Will continue to monitor for safety.

## 2022-01-05 NOTE — Progress Notes (Signed)
Coumadin held per MD d/t PT/INR results.

## 2022-01-05 NOTE — Progress Notes (Signed)
Patient was attempting to get up from chair in day room when he lost his balance and fell on his bottom. He stated he was going to the restroom. ROM to all extremities noted. No c/o pain or injury at this time. Charge nurse aware and patient was give a walker to assist with mobility. 3 staff members placed patient back into chair and vitals and skin assessment completed.

## 2022-01-05 NOTE — Progress Notes (Signed)
[facility] MD Progress Note  01/05/2022 11:38 AM [name]  MRN:  [id number] Subjective: [name] is been seen today.  He thinks that he slept a little bit better.  I told him I am working on his medications to help with his anxiety and depression more than trying to address his sleep problems.  I told him that sometimes when people's depression improves her sleep improves.  He states that he feels a little dizzy and weak and I told him his blood pressure is low and his lab work has been off a little bit including increase in his potassium and INR.  We will hold his Coumadin this evening and decrease his potassium to 40 mg/day instead of 3 times a day.  Principal Problem: MDD (major depressive disorder), recurrent episode ([place]) Diagnosis: Principal Problem:   MDD (major depressive disorder), recurrent episode ([place])  Total Time spent with patient: 15 minutes  Past Psychiatric History: yes  Past Medical History:  Past Medical History:  Diagnosis Date   Atrial fibrillation ([place]) 08/2009   CAD (coronary artery disease)    s/p CABG -- 1992   Depression    ESRD (end stage renal disease) ([place])    HLD (hyperlipidemia)    HTN (hypertension)    Idiopathic thrombocytopenia ([facility])     Past Surgical History:  Procedure Laterality Date   APPENDECTOMY     CORONARY ARTERY BYPASS GRAFT     FACIAL COSMETIC SURGERY     GALLBLADDER SURGERY     HERNIA REPAIR     KIDNEY TRANSPLANT     LEFT HEART CATH AND CORONARY ANGIOGRAPHY N/A 08/07/2018   Procedure: LEFT HEART CATH AND CORONARY ANGIOGRAPHY;  Surgeon: Sherren Mocha, MD;  Location: [place] [facility];  Service: Cardiovascular;  Laterality: N/A;   LIVER TRANSPLANT     PRESSURE SENSOR/CARDIOMEMS N/A 08/04/2021   Procedure: PRESSURE SENSOR/CARDIOMEMS;  Surgeon: Larey Dresser, MD;  Location: [place] [facility];  Service: Cardiovascular;  Laterality: N/A;   RIGHT HEART CATH N/A 06/25/2021   Procedure: RIGHT HEART CATH;  Surgeon: Larey Dresser, MD;   Location: [place] [facility];  Service: Cardiovascular;  Laterality: N/A;   Family History:  Family History  Problem Relation Age of Onset   Atrial fibrillation Mother    Breast cancer Mother    Renal cancer Father    Hypertension Brother    Hyperlipidemia Brother    Other Brother        stent    Renal Disease Maternal Grandmother    Stroke Maternal Grandmother    Heart attack Maternal Grandfather    Cancer Paternal Grandmother    Heart failure Paternal Grandfather    Emphysema Paternal Grandfather    Bronchiolitis Paternal Grandfather     Social History:  Social History   Substance and Sexual Activity  Alcohol Use No     Social History   Substance and Sexual Activity  Drug Use No    Social History   Socioeconomic History   Marital status: Single    Spouse name: Not on file   Number of children: 0   Years of education: Not on file   Highest education level: Not on file  Occupational History   Occupation: retired  Tobacco Use   Smoking status: Never   Smokeless tobacco: Never  Vaping Use   Vaping Use: Never used  Substance and Sexual Activity   Alcohol use: No   Drug use: No   Sexual activity: Not on file  Other Topics Concern   Not on file  Social History Narrative   Not on file   Social Determinants of Health   Financial Resource Strain: Low Risk    Difficulty of Paying Living Expenses: Not very hard  Food Insecurity: No Food Insecurity   Worried About Running Out of Food in the Last Year: Never true   Ran Out of Food in the Last Year: Never true  Transportation Needs: No Transportation Needs   Lack of Transportation (Medical): No   Lack of Transportation (Non-Medical): No  Physical Activity: Not on file  Stress: Not on file  Social Connections: Not on file   Additional Social History:                         Sleep: Poor  Appetite:  Fair  Current Medications: Current Facility-Administered Medications  Medication Dose Route  Frequency Provider Last Rate Last Admin   acetaminophen (TYLENOL) tablet 650 mg  650 mg Oral Q6H PRN Suella Broad, FNP   650 mg at 01/05/22 0201   alum & mag hydroxide-simeth (MAALOX/MYLANTA) 200-200-20 MG/5ML suspension 30 mL  30 mL Oral Q4H PRN Suella Broad, FNP       amiodarone (PACERONE) tablet 200 mg  200 mg Oral Daily Suella Broad, FNP   200 mg at 01/05/22 0942   atorvastatin (LIPITOR) tablet 20 mg  20 mg Oral QHS Suella Broad, FNP   20 mg at 01/04/22 2048   dapagliflozin propanediol (FARXIGA) tablet 10 mg  10 mg Oral Daily Renda Rolls, RPH   10 mg at 01/05/22 1478   LORazepam (ATIVAN) tablet 1 mg  1 mg Oral Q4H PRN Parks Ranger, DO   1 mg at 01/04/22 1940   magnesium hydroxide (MILK OF MAGNESIA) suspension 30 mL  30 mL Oral Daily PRN Suella Broad, FNP       magnesium oxide (MAG-OX) tablet 400 mg  400 mg Oral BID Suella Broad, FNP   400 mg at 01/05/22 0943   OLANZapine (ZYPREXA) tablet 15 mg  15 mg Oral QHS Parks Ranger, DO       ondansetron [facility]) tablet 4 mg  4 mg Oral Q8H PRN He, [name], MD   4 mg at 12/27/21 0848   [START ON 01/06/2022] potassium chloride SA (KLOR-CON M) CR tablet 40 mEq  40 mEq Oral Daily Parks Ranger, DO       tacrolimus (PROGRAF) capsule 0.5 mg  0.5 mg Oral [name], FNP   0.5 mg at 01/05/22 0942   tacrolimus (PROGRAF) capsule 1 mg  1 mg Oral [name], FNP   1 mg at 01/04/22 0956   temazepam (RESTORIL) capsule 30 mg  30 mg Oral QHS Parks Ranger, DO   30 mg at 01/04/22 2048   [START ON 01/06/2022] torsemide (DEMADEX) tablet 60 mg  60 mg Oral Daily Parks Ranger, DO       [START ON 01/06/2022] venlafaxine XR (EFFEXOR-XR) 24 hr capsule 75 mg  75 mg Oral Q breakfast Parks Ranger, DO       warfarin (COUMADIN) tablet 2 mg  2 mg Oral Once per day on Sun Tue Wed Thu Fri Sat Parks Ranger, DO   2 mg at  01/03/22 1709   warfarin (COUMADIN) tablet 4 mg  4 mg Oral Q Mon-1800 Suella Broad, FNP   4 mg at 01/04/22 1814  Warfarin - Physician Dosing Inpatient   Does not apply q1600 [name], [facility]   Given at 01/04/22 3299    Lab Results:  Results for orders placed or performed during the hospital encounter of 12/25/21 (from the past 48 hour(s))  Protime-INR     Status: Abnormal   Collection Time: 01/04/22  7:14 AM  Result Value Ref Range   Prothrombin Time 30.4 (H) 11.4 - 15.2 seconds   INR 2.9 (H) 0.8 - 1.2    Comment: (NOTE) INR goal varies based on device and disease states. Performed at [facility], [address] [place], [place] [address]   Basic metabolic panel     Status: Abnormal   Collection Time: 01/04/22  7:14 AM  Result Value Ref Range   Sodium 142 135 - 145 mmol/L    Comment: Performed at [facility], [address] [place], [place] [address]   Potassium 5.3 (H) 3.5 - 5.1 mmol/L    Comment: Performed at [facility], [place], [place] [address]   Chloride 105 98 - 111 mmol/L    Comment: Performed at [facility], [place], [place] [address]   CO2 27 22 - 32 mmol/L    Comment: Performed at [facility], [address] [place], [place] [address]   Glucose, Bld 96 70 - 99 mg/dL    Comment: Glucose reference range applies only to samples taken after fasting for at least 8 hours. Performed at [facility], [address] [place], [place] [address]    BUN 18 8 - 23 mg/dL   Creatinine, Ser 2.78 (H) 0.61 - 1.24 mg/dL   Calcium 9.1 8.9 - 10.3 mg/dL   GFR, Estimated 24 (L) >60 mL/min    Comment: (NOTE) Calculated using the CKD-EPI Creatinine Equation (2021)    Anion gap 10 5 - 15    Comment: Performed at [facility], [address] [place], [place] [address]  Protime-INR     Status: Abnormal   Collection Time: 01/05/22  8:42 AM  Result Value Ref Range    Prothrombin Time 32.3 (H) 11.4 - 15.2 seconds   INR 3.2 (H) 0.8 - 1.2    Comment: (NOTE) INR goal varies based on device and disease states. Performed at [facility], [address] [place], [place] [address]     Blood Alcohol level:  Lab Results  Component Value Date   [facility] <10 12/24/2021   ETH <10 70/26/3785    Metabolic Disorder Labs: Lab Results  Component Value Date   HGBA1C 5.2 11/17/2021   MPG 102.54 11/17/2021   MPG 105.41 08/05/2018   No results found for: PROLACTIN Lab Results  Component Value Date   CHOL 117 11/17/2021   TRIG 96 11/17/2021   HDL 28 (L) 11/17/2021   CHOLHDL 4.2 11/17/2021   VLDL 19 11/17/2021   LDLCALC 70 11/17/2021   LDLCALC 87 08/22/2019    Physical Findings: AIMS:  , ,  ,  ,    CIWA:    COWS:     Musculoskeletal: Strength & Muscle Tone: within normal limits Gait & Station: unsteady Patient leans: N/A  Psychiatric Specialty Exam:  Presentation  General Appearance: No data recorded Eye Contact:No data recorded Speech:No data recorded Speech Volume:No data recorded Handedness:No data recorded  Mood and Affect  Mood:No data recorded Affect:No data recorded  Thought Process  Thought Processes:No data recorded Descriptions of Associations:No data recorded Orientation:No data  recorded Thought Content:No data recorded History of Schizophrenia/Schizoaffective disorder:No  Duration of Psychotic Symptoms:No data recorded Hallucinations:No data recorded Ideas of Reference:No data recorded Suicidal Thoughts:No data recorded Homicidal Thoughts:No data recorded  Sensorium  Memory:No data recorded Judgment:No data recorded Insight:No data recorded  Executive Functions  Concentration:No data recorded Attention Span:No data recorded Recall:No data recorded [employer] [place] recorded Language:No data recorded  Psychomotor Activity  Psychomotor Activity:No data recorded  Assets  Assets:No data  recorded  Sleep  Sleep:No data recorded   Physical Exam: Physical Exam Vitals and nursing note reviewed.  Constitutional:      Appearance: Normal appearance. He is normal weight.  Neurological:     General: No focal deficit present.     Mental Status: He is alert and oriented to person, place, and time.  Psychiatric:        Attention and Perception: Attention and perception normal.        Mood and Affect: Mood is anxious and depressed.        Speech: Speech normal.        Behavior: Behavior normal. Behavior is cooperative.        Thought Content: Thought content normal.        Cognition and Memory: Cognition and memory normal.        Judgment: Judgment normal.   Review of Systems  Constitutional: Negative.   HENT: Negative.    Eyes: Negative.   Respiratory: Negative.    Cardiovascular: Negative.   Gastrointestinal: Negative.   Genitourinary: Negative.   Musculoskeletal: Negative.   Skin: Negative.   Neurological: Negative.   Endo/Heme/Allergies: Negative.   Psychiatric/Behavioral:  Positive for depression. The patient is nervous/anxious and has insomnia.   Blood pressure 93/66, pulse 61, temperature 97.9 F (36.6 C), temperature source Oral, resp. rate 18, height 5\' 10"  (1.778 m), weight 81.2 kg, SpO2 98 %. Body mass index is 25.69 kg/m.   Treatment Plan Summary: Daily contact with patient to assess and evaluate symptoms and progress in treatment, Medication management, and Plan increase Effexor XR to 75 mg/day.  Hold warfarin to tonight.  Decrease potassium to 40 mEq once a day instead of 3 times a day.  Recheck labs in 2 days.  Decrease Demadex from 100mg  to 60 mg.  His blood pressure has been lower.  Parks Ranger, DO 01/05/2022, 11:38 AM

## 2022-01-06 DIAGNOSIS — F333 Major depressive disorder, recurrent, severe with psychotic symptoms: Secondary | ICD-10-CM | POA: Diagnosis not present

## 2022-01-06 LAB — PROTIME-INR
INR: 3.1 — ABNORMAL HIGH (ref 0.8–1.2)
Prothrombin Time: 32.2 seconds — ABNORMAL HIGH (ref 11.4–15.2)

## 2022-01-06 MED ORDER — WARFARIN SODIUM 1 MG PO TABS
1.0000 mg | ORAL_TABLET | ORAL | Status: DC
  Administered 2022-01-06 – 2022-01-10 (×5): 1 mg via ORAL
  Filled 2022-01-06 (×5): qty 1

## 2022-01-06 NOTE — Progress Notes (Signed)
°   01/05/22 1842  What Happened  Was fall witnessed? Yes  Who witnessed fall? Delos Haring, RN  Patients activity before fall other (comment) (sitting in dayroom watching tv)  Point of contact buttocks  Was patient injured? No  Patient found on floor  Found by Staff-comment  Stated prior activity ambulating-unassisted  Follow Up  MD notified Louis Meckel  Time MD notified [phone]  Family notified Yes - comment  Time family notified 24  Adult Fall Risk Assessment  Risk Factor Category (scoring not indicated) High fall risk per protocol (document High fall risk)  Age 71  Fall History: Fall within 6 months prior to admission 5  Elimination; Bowel and/or Urine Incontinence 0  Elimination; Bowel and/or Urine Urgency/Frequency 0  Medications: includes PCA/Opiates, Anti-convulsants, Anti-hypertensives, Diuretics, Hypnotics, Laxatives, Sedatives, and Psychotropics 5  Patient Care Equipment 0  Mobility-Assistance 0  Mobility-Gait 2  Mobility-Sensory Deficit 0  Altered awareness of immediate physical environment 0  Impulsiveness 0  Lack of understanding of one's physical/cognitive limitations 0  Total Score 13  Patient Fall Risk Level High fall risk  Adult Fall Risk Interventions  Required Bundle Interventions *See Row Information* High fall risk - low, moderate, and high requirements implemented  Additional Interventions Reorient/diversional activities with confused patients  Screening for Fall Injury Risk (To be completed on HIGH fall risk patients) - Assessing Need for Floor Mats  Risk For Fall Injury- Criteria for Floor Mats None identified - No additional interventions needed  Vitals  Temp 97.6 F (36.4 C)  Temp Source Oral  BP 94/68  MAP (mmHg) 77  BP Location Left Arm  BP Method Automatic  Patient Position (if appropriate) Sitting  Pulse Rate 77  Resp 20  Oxygen Therapy  SpO2 (!) 89 %  O2 Device Room Air

## 2022-01-06 NOTE — Progress Notes (Signed)
Patient assessed at bedside. Patient stated that he has episodes of weakness in his legs and lightheadedness. Patient encouraged to ask for help getting up if he feels this way and to continue to use walker. Patient denies any pain from recent falls. Patient denies SI, HI, and AVH. He rates depression as a 6/10 and anxiety as a 9/10. Patient continues to endorse poor sleep. Patient refused to come out for breakfast this morning after two attempts. Patient also encouraged to drink fluids and was given Gatorade. ?

## 2022-01-06 NOTE — Progress Notes (Signed)
Recreation Therapy Notes ? ?Date: 01/06/2022  ? ?Time: 1:25 pm   ? ?Location: Craft room   ? ?Behavioral response: Appropriate ? ?Intervention Topic:  Self-care   ? ?Discussion/Intervention:  ?Group content today was focused on Self-Care. The group defined self-care and some positive ways they care for themselves. Individuals expressed ways and reasons why they neglected any self-care in the past. Patients described ways to improve self-care in the future. The group explained what could happen if they did not do any self-care activities at all. The group participated in the intervention ?self-care assessment? where they had a chance to discover some of their weaknesses and strengths in self- care. Patient came up with a self-care plan to improve themselves in the future.  ? ?Clinical Observations/Feedback: ?Patient came to group late and was focused on what peers and staff had to say about self-care. Individual was social with peers and staff while participating in the intervention.   ?Hosam Mcfetridge LRT/CTRS  ? ? ? ? ? ? ? ?Jesika Men ?01/06/2022 3:19 PM ?

## 2022-01-06 NOTE — Progress Notes (Signed)
[facility] MD Progress Note  01/06/2022 1:03 PM [name]  MRN:  [id number] Subjective: [name] continues to have trouble sleeping.  He states that he did fall sleep last night because he was dreaming but woke up and did receive some Ativan and went back to bed.  He states that the Ativan did help.  I did hold his Restoril last night because his blood pressure has been low.  He tells me that his blood pressure always runs low usually around 90/60.  He has had some unsteadiness at night.  We have held his Coumadin because his INR has been 3.0.  I am to go ahead and just decrease it to 1 mg since he continues to be around 3.0.  He is tolerating his medications without any side effects besides an occasional dizziness but that could be related to his blood pressure and laying down all the time.  We discussed getting him another appointment at [facility] for sleep study when he is discharged.  Apparently, he had 1 scheduled before his last admission.  Principal Problem: MDD (major depressive disorder), recurrent episode ([place]) Diagnosis: Principal Problem:   MDD (major depressive disorder), recurrent episode ([place])  Total Time spent with patient: 15 minutes  Past Psychiatric History: Yes  Past Medical History:  Past Medical History:  Diagnosis Date   Atrial fibrillation ([place]) 08/2009   CAD (coronary artery disease)    s/p CABG -- 1992   Depression    ESRD (end stage renal disease) ([place])    HLD (hyperlipidemia)    HTN (hypertension)    Idiopathic thrombocytopenia (HCC)     Past Surgical History:  Procedure Laterality Date   APPENDECTOMY     CORONARY ARTERY BYPASS GRAFT     FACIAL COSMETIC SURGERY     GALLBLADDER SURGERY     HERNIA REPAIR     KIDNEY TRANSPLANT     LEFT HEART CATH AND CORONARY ANGIOGRAPHY N/A 08/07/2018   Procedure: LEFT HEART CATH AND CORONARY ANGIOGRAPHY;  Surgeon: Sherren Mocha, MD;  Location: [place] [facility];  Service: Cardiovascular;  Laterality: N/A;   LIVER  TRANSPLANT     PRESSURE SENSOR/CARDIOMEMS N/A 08/04/2021   Procedure: PRESSURE SENSOR/CARDIOMEMS;  Surgeon: Larey Dresser, MD;  Location: [place] [facility];  Service: Cardiovascular;  Laterality: N/A;   RIGHT HEART CATH N/A 06/25/2021   Procedure: RIGHT HEART CATH;  Surgeon: Larey Dresser, MD;  Location: [place] [facility];  Service: Cardiovascular;  Laterality: N/A;   Family History:  Family History  Problem Relation Age of Onset   Atrial fibrillation Mother    Breast cancer Mother    Renal cancer Father    Hypertension Brother    Hyperlipidemia Brother    Other Brother        stent    Renal Disease Maternal Grandmother    Stroke Maternal Grandmother    Heart attack Maternal Grandfather    Cancer Paternal Grandmother    Heart failure Paternal Grandfather    Emphysema Paternal Grandfather    Bronchiolitis Paternal Grandfather     Social History:  Social History   Substance and Sexual Activity  Alcohol Use No     Social History   Substance and Sexual Activity  Drug Use No    Social History   Socioeconomic History   Marital status: Single    Spouse name: Not on file   Number of children: 0   Years of education: Not on file   Highest education  level: Not on file  Occupational History   Occupation: retired  Tobacco Use   Smoking status: Never   Smokeless tobacco: Never  Vaping Use   Vaping Use: Never used  Substance and Sexual Activity   Alcohol use: No   Drug use: No   Sexual activity: Not on file  Other Topics Concern   Not on file  Social History Narrative   Not on file   Social Determinants of Health   Financial Resource Strain: Low Risk    Difficulty of Paying Living Expenses: Not very hard  Food Insecurity: No Food Insecurity   Worried About Charity fundraiser in the Last Year: Never true   [place] in the Last Year: Never true  Transportation Needs: No Transportation Needs   Lack of Transportation (Medical): No   Lack of  Transportation (Non-Medical): No  Physical Activity: Not on file  Stress: Not on file  Social Connections: Not on file   Additional Social History:                         Sleep: Poor  Appetite:  Fair  Current Medications: Current Facility-Administered Medications  Medication Dose Route Frequency Provider Last Rate Last Admin   acetaminophen (TYLENOL) tablet 650 mg  650 mg Oral Q6H PRN Suella Broad, FNP   650 mg at 01/06/22 0009   alum & mag hydroxide-simeth (MAALOX/MYLANTA) 200-200-20 MG/5ML suspension 30 mL  30 mL Oral Q4H PRN Starkes-Perry, Gayland Curry, FNP       amiodarone (PACERONE) tablet 200 mg  200 mg Oral Daily Suella Broad, FNP   200 mg at 01/06/22 0932   atorvastatin (LIPITOR) tablet 20 mg  20 mg Oral QHS Suella Broad, FNP   20 mg at 01/05/22 2215   dapagliflozin propanediol (FARXIGA) tablet 10 mg  10 mg Oral Daily Renda Rolls, RPH   10 mg at 01/06/22 0932   LORazepam (ATIVAN) tablet 1 mg  1 mg Oral Q4H PRN Parks Ranger, DO   1 mg at 01/05/22 2227   magnesium hydroxide (MILK OF MAGNESIA) suspension 30 mL  30 mL Oral Daily PRN Suella Broad, FNP       magnesium oxide (MAG-OX) tablet 400 mg  400 mg Oral BID Suella Broad, FNP   400 mg at 01/06/22 0932   OLANZapine (ZYPREXA) tablet 15 mg  15 mg Oral QHS Parks Ranger, DO   15 mg at 01/05/22 2214   ondansetron (ZOFRAN) tablet 4 mg  4 mg Oral Q8H PRN He, [name], MD   4 mg at 12/27/21 0848   potassium chloride SA (KLOR-CON M) CR tablet 40 mEq  40 mEq Oral Daily Parks Ranger, DO   40 mEq at 01/06/22 0932   tacrolimus (PROGRAF) capsule 0.5 mg  0.5 mg Oral [name], FNP   0.5 mg at 01/05/22 0942   tacrolimus (PROGRAF) capsule 1 mg  1 mg Oral [name], FNP   1 mg at 01/06/22 0933   torsemide (DEMADEX) tablet 60 mg  60 mg Oral Daily Parks Ranger, DO   60 mg at 01/06/22 0932   venlafaxine XR  (EFFEXOR-XR) 24 hr capsule 75 mg  75 mg Oral Q breakfast Parks Ranger, DO   75 mg at 01/06/22 4970   warfarin (COUMADIN) tablet 1 mg  1 mg Oral Once per day on Sun Tue Wed Thu Fri  Sat Parks Ranger, DO       warfarin (COUMADIN) tablet 4 mg  4 mg Oral Q Mon-1800 Suella Broad, FNP   4 mg at 01/04/22 1814   Warfarin - Physician Dosing Inpatient   Does not apply q1600 [name], [facility]   Given at 01/04/22 1826    Lab Results:  Results for orders placed or performed during the hospital encounter of 12/25/21 (from the past 48 hour(s))  Protime-INR     Status: Abnormal   Collection Time: 01/05/22  8:42 AM  Result Value Ref Range   Prothrombin Time 32.3 (H) 11.4 - 15.2 seconds   INR 3.2 (H) 0.8 - 1.2    Comment: (NOTE) INR goal varies based on device and disease states. Performed at [facility], [address] [place], [place] [address]   Protime-INR     Status: Abnormal   Collection Time: 01/06/22  6:03 AM  Result Value Ref Range   Prothrombin Time 32.2 (H) 11.4 - 15.2 seconds   INR 3.1 (H) 0.8 - 1.2    Comment: (NOTE) INR goal varies based on device and disease states. Performed at [facility], [address] [place], [place] [address]     Blood Alcohol level:  Lab Results  Component Value Date   [facility] <10 12/24/2021   ETH <10 58/85/0277    Metabolic Disorder Labs: Lab Results  Component Value Date   HGBA1C 5.2 11/17/2021   MPG 102.54 11/17/2021   MPG 105.41 08/05/2018   No results found for: PROLACTIN Lab Results  Component Value Date   CHOL 117 11/17/2021   TRIG 96 11/17/2021   HDL 28 (L) 11/17/2021   CHOLHDL 4.2 11/17/2021   VLDL 19 11/17/2021   LDLCALC 70 11/17/2021   LDLCALC 87 08/22/2019    Physical Findings: AIMS:  , ,  ,  ,    CIWA:    COWS:     Musculoskeletal: Strength & Muscle Tone: within normal limits Gait & Station: normal Patient leans: N/A  Psychiatric Specialty Exam:  Presentation   General Appearance: No data recorded Eye Contact:No data recorded Speech:No data recorded Speech Volume:No data recorded Handedness:No data recorded  Mood and Affect  Mood:No data recorded Affect:No data recorded  Thought Process  Thought Processes:No data recorded Descriptions of Associations:No data recorded Orientation:No data recorded Thought Content:No data recorded History of Schizophrenia/Schizoaffective disorder:No  Duration of Psychotic Symptoms:No data recorded Hallucinations:No data recorded Ideas of Reference:No data recorded Suicidal Thoughts:No data recorded Homicidal Thoughts:No data recorded  Sensorium  Memory:No data recorded Judgment:No data recorded Insight:No data recorded  Executive Functions  Concentration:No data recorded Attention Span:No data recorded Recall:No data recorded Fund of Knowledge:No data recorded Language:No data recorded  Psychomotor Activity  Psychomotor Activity:No data recorded  Assets  Assets:No data recorded  Sleep  Sleep:No data recorded   Physical Exam: Physical Exam Vitals and nursing note reviewed.  Constitutional:      Appearance: Normal appearance. He is normal weight.  Neurological:     General: No focal deficit present.     Mental Status: He is alert and oriented to person, place, and time.  Psychiatric:        Attention and Perception: Attention and perception normal.        Mood and Affect: Mood is anxious and depressed. Affect is flat.        Speech: Speech normal.        Behavior: Behavior normal. Behavior is cooperative.  Thought Content: Thought content normal.        Cognition and Memory: Cognition and memory normal.        Judgment: Judgment normal.   Review of Systems  Constitutional: Negative.   HENT: Negative.    Eyes: Negative.   Respiratory: Negative.    Cardiovascular: Negative.   Gastrointestinal: Negative.   Genitourinary: Negative.   Musculoskeletal: Negative.   Skin:  Negative.   Neurological: Negative.   Endo/Heme/Allergies: Negative.   Psychiatric/Behavioral:  Positive for depression. The patient has insomnia.    Blood pressure (!) 89/67, pulse 81, temperature 97.9 F (36.6 C), temperature source Oral, resp. rate 20, height 5\' 10"  (1.778 m), weight 81.2 kg, SpO2 95 %. Body mass index is 25.69 kg/m.   Treatment Plan Summary: Daily contact with patient to assess and evaluate symptoms and progress in treatment, Medication management, and Plan decrease Coumadin to 1 mg/day.  Continue to check PT/INR.  We will check his lab work including a CBC and CMP on Friday morning.  Discontinue Restoril and use Ativan as needed.  Continue to titrate his Effexor as needed for his depression.  Parks Ranger, DO 01/06/2022, 1:03 PM

## 2022-01-07 LAB — PROTIME-INR
INR: 2.9 — ABNORMAL HIGH (ref 0.8–1.2)
Prothrombin Time: 30 seconds — ABNORMAL HIGH (ref 11.4–15.2)

## 2022-01-07 MED ORDER — VENLAFAXINE HCL ER 37.5 MG PO CP24
112.5000 mg | ORAL_CAPSULE | Freq: Every day | ORAL | Status: DC
  Administered 2022-01-08 – 2022-01-11 (×4): 112.5 mg via ORAL
  Filled 2022-01-07 (×4): qty 1

## 2022-01-07 NOTE — Progress Notes (Signed)
Pt lying in bed with eyes closed; easily aroused when name called. Pt states that he feels "a little anxious". He denies pain and SI/HI/AVH at this time. He states "I slept a little bit" last night. He describes his appetite as "not good" and says "I been making myself eat." He expresses feelings of anxiety, which he rates 6/10, due to sleep and says "That's what I worry about." He also expresses feelings of depression, which he rates 5/10, due to "all this that's going on in the hospital with me." No acute distress noted. ?

## 2022-01-07 NOTE — Progress Notes (Signed)
Recreation Therapy Notes ? ?Date: 01/07/2022  ? ?Time: 1:35 pm   ? ?Location: Craft room   ? ?Behavioral response: Appropriate ? ?Intervention Topic:  Creative expressions    ? ?Discussion/Intervention:  ?Group content on today was focused on creative expressions. The group defined creative expressions and ways they use creative expressions. Individual identified other positive ways creative expressions can be used and why it is important to express yourself. Patients participated in the intervention ?expressive shading?, where they had a chance to creatively express themselves. ? ?Clinical Observations/Feedback: ?Patient came to group and was focused on what peers and staff had to say about creative expressions. Individual was social with peers and staff while participating in the intervention.   ?Braxston Quinter LRT/CTRS  ? ? ? ? ? ? ? ? ?Naomia Lenderman ?01/07/2022 4:25 PM ?

## 2022-01-07 NOTE — Progress Notes (Signed)
Pt sitting on edge of bed; anxious, cooperative. Pt states "I'm real wound up; real tense" because of "a lot of noise". He denies pain and SI/HI/AVH at this time. He says "I slept not very well" when asked how he slept last night. He describes his appetite as "fair". He expresses feelings of anxiety, which he rate 10/10, due to being "worried about going to sleep and noise". He also says that he is experiencing depression, which he rates 7/10, due to "just the way I am and the way things are. I'm not resting very much." Pt declined offer to turn off his light and close door to facilitate better sleep. No acute distress noted. ?

## 2022-01-07 NOTE — Progress Notes (Signed)
Patient denies SI, HI, AVH, and pain. He says that he felt he slept better last night than previous nights and that the Ativan may have helped. Patient appetite is also improved and patient ate 100% of breakfast. Patient denies feeling lightheaded and episodes of dizziness. He rates anxiety and depression as a 5/10. Patient is compliant with scheduled medications. Patient remains safe on the unit at this time. ?

## 2022-01-07 NOTE — BH IP Treatment Plan (Signed)
Interdisciplinary Treatment and Diagnostic Plan Update  01/07/2022 Time of Session: 9:30AM [name] MRN: [id number]  Principal Diagnosis: MDD (major depressive disorder), recurrent episode ([place])  Secondary Diagnoses: Principal Problem:   MDD (major depressive disorder), recurrent episode ([place])   Current Medications:  Current Facility-Administered Medications  Medication Dose Route Frequency Provider Last Rate Last Admin   acetaminophen (TYLENOL) tablet 650 mg  650 mg Oral Q6H PRN Suella Broad, FNP   650 mg at 01/06/22 0009   alum & mag hydroxide-simeth (MAALOX/MYLANTA) 200-200-20 MG/5ML suspension 30 mL  30 mL Oral Q4H PRN Suella Broad, FNP       amiodarone (PACERONE) tablet 200 mg  200 mg Oral Daily Suella Broad, FNP   200 mg at 01/07/22 0910   atorvastatin (LIPITOR) tablet 20 mg  20 mg Oral QHS Suella Broad, FNP   20 mg at 01/06/22 2220   dapagliflozin propanediol (FARXIGA) tablet 10 mg  10 mg Oral Daily Renda Rolls, RPH   10 mg at 01/07/22 9628   LORazepam (ATIVAN) tablet 1 mg  1 mg Oral Q4H PRN Parks Ranger, DO   1 mg at 01/06/22 2220   magnesium hydroxide (MILK OF MAGNESIA) suspension 30 mL  30 mL Oral Daily PRN Suella Broad, FNP       magnesium oxide (MAG-OX) tablet 400 mg  400 mg Oral BID Suella Broad, FNP   400 mg at 01/07/22 0909   OLANZapine (ZYPREXA) tablet 15 mg  15 mg Oral QHS Parks Ranger, DO   15 mg at 01/06/22 2220   ondansetron (ZOFRAN) tablet 4 mg  4 mg Oral Q8H PRN He, [name], MD   4 mg at 12/27/21 0848   potassium chloride SA (KLOR-CON M) CR tablet 40 mEq  40 mEq Oral Daily Parks Ranger, DO   40 mEq at 01/07/22 0909   tacrolimus (PROGRAF) capsule 0.5 mg  0.5 mg Oral [name], FNP   0.5 mg at 01/07/22 0909   tacrolimus (PROGRAF) capsule 1 mg  1 mg Oral [name], FNP   1 mg at 01/06/22 0933   torsemide (DEMADEX) tablet 60 mg  60  mg Oral Daily Parks Ranger, DO   60 mg at 01/07/22 0909   venlafaxine XR (EFFEXOR-XR) 24 hr capsule 75 mg  75 mg Oral Q breakfast Parks Ranger, DO   75 mg at 01/07/22 3662   warfarin (COUMADIN) tablet 1 mg  1 mg Oral Once per day on Sun Tue Wed Thu Fri Sat Parks Ranger, DO   1 mg at 01/06/22 1705   warfarin (COUMADIN) tablet 4 mg  4 mg Oral Q Mon-1800 Suella Broad, FNP   4 mg at 01/04/22 1814   Warfarin - Physician Dosing Inpatient   Does not apply q1600 [name], [facility]   Given at 01/06/22 1655   PTA Medications: Medications Prior to Admission  Medication Sig Dispense Refill Last Dose   allopurinol (ZYLOPRIM) 100 MG tablet Take 100 mg by mouth daily.      amiodarone (PACERONE) 200 MG tablet Take 1 tablet (200 mg total) by mouth daily. 30 tablet 5    ARIPiprazole (ABILIFY) 5 MG tablet Take 1 tablet (5 mg total) by mouth at bedtime. 30 tablet 2    atorvastatin (LIPITOR) 40 MG tablet Take 1 tablet (40 mg total) by mouth at bedtime. (Patient taking differently: Take 20 mg by mouth at bedtime.) 90  tablet 3    calcitRIOL (ROCALTROL) 0.25 MCG capsule Take 0.25 mcg by mouth daily.      Cholecalciferol (VITAMIN D3) 50 MCG (2000 UT) TABS Take 2,000 Units by mouth daily.      clonazePAM (KLONOPIN) 1 MG tablet Take 1 tablet (1 mg total) by mouth at bedtime. 30 tablet 2    Cyanocobalamin (VITAMIN B12) 1000 MCG TBCR Take 1,000 mcg by mouth daily.      dapagliflozin propanediol (FARXIGA) 10 MG TABS tablet Take 1 tablet (10 mg total) by mouth daily. 30 tablet 6    EPINEPHrine (EPI-PEN) 0.3 mg/0.3 mL DEVI Inject 0.3 mg into the muscle once as needed (severe allergic reaction). (Patient not taking: Reported on 12/24/2021)      ferrous sulfate 325 (65 FE) MG tablet Take 325 mg by mouth daily with breakfast.      fish oil-omega-3 fatty acids 1000 MG capsule Take 1 g by mouth 2 (two) times daily.      MAGNESIUM-OXIDE 400 (240 Mg) MG tablet Take 400 mg by mouth 2 (two)  times daily.      mirtazapine (REMERON) 15 MG tablet Take 1 tablet (15 mg total) by mouth at bedtime. 30 tablet 2    nitroGLYCERIN (NITROSTAT) 0.4 MG SL tablet Place 0.4 mg under the tongue every 5 (five) minutes as needed for chest pain. (Patient not taking: Reported on 12/24/2021)      potassium chloride SA (KLOR-CON) 20 MEQ tablet Take 2 tablets (40 mEq total) by mouth 3 (three) times daily. 90 tablet 6    tacrolimus (PROGRAF) 0.5 MG capsule Take 0.5 mg by mouth See admin instructions. Takes 0.5 mg  tablet by mouth one day and 1 mg tablets next day (1 and 2 tablets on alternate days)      torsemide (DEMADEX) 20 MG tablet Take 5 tablets (100 mg total) by mouth daily. 210 tablet 6    traZODone (DESYREL) 100 MG tablet Take 1 tablet (100 mg total) by mouth at bedtime as needed for sleep. 30 tablet 2    warfarin (COUMADIN) 4 MG tablet TAKE AS DIRECTED BY COUMADIN CLINIC (Patient taking differently: Take 2-4 mg by mouth See admin instructions. Take 4mg  by mouth on Monday, then take 2mg  (1/2 tablet) all other days) 65 tablet 1     Patient Stressors: Health problems   Other: loneliness    Patient Strengths: Ability for insight  Average or above average intelligence   Treatment Modalities: Medication Management, Group therapy, Case management,  1 to 1 session with clinician, Psychoeducation, Recreational therapy.   Physician Treatment Plan for Primary Diagnosis: MDD (major depressive disorder), recurrent episode ([place]) Long Term Goal(s): Improvement in symptoms so as ready for discharge   Short Term Goals: Ability to identify changes in lifestyle to reduce recurrence of condition will improve Ability to verbalize feelings will improve Ability to disclose and discuss suicidal ideas Ability to demonstrate self-control will improve Ability to identify and develop effective coping behaviors will improve Ability to maintain clinical measurements within normal limits will improve Compliance with  prescribed medications will improve Ability to identify triggers associated with substance abuse/mental health issues will improve  Medication Management: Evaluate patient's response, side effects, and tolerance of medication regimen.  Therapeutic Interventions: 1 to 1 sessions, Unit Group sessions and Medication administration.  Evaluation of Outcomes: Progressing  Physician Treatment Plan for Secondary Diagnosis: Principal Problem:   MDD (major depressive disorder), recurrent episode ([place])  Long Term Goal(s): Improvement in symptoms so as ready  for discharge   Short Term Goals: Ability to identify changes in lifestyle to reduce recurrence of condition will improve Ability to verbalize feelings will improve Ability to disclose and discuss suicidal ideas Ability to demonstrate self-control will improve Ability to identify and develop effective coping behaviors will improve Ability to maintain clinical measurements within normal limits will improve Compliance with prescribed medications will improve Ability to identify triggers associated with substance abuse/mental health issues will improve     Medication Management: Evaluate patient's response, side effects, and tolerance of medication regimen.  Therapeutic Interventions: 1 to 1 sessions, Unit Group sessions and Medication administration.  Evaluation of Outcomes: Progressing   RN Treatment Plan for Primary Diagnosis: MDD (major depressive disorder), recurrent episode ([place]) Long Term Goal(s): Knowledge of disease and therapeutic regimen to maintain health will improve  Short Term Goals: Ability to remain free from injury will improve, Ability to verbalize frustration and anger appropriately will improve, Ability to demonstrate self-control, Ability to participate in decision making will improve, Ability to verbalize feelings will improve, Ability to disclose and discuss suicidal ideas, Ability to identify and develop effective  coping behaviors will improve, and Compliance with prescribed medications will improve  Medication Management: RN will administer medications as ordered by provider, will assess and evaluate patient's response and provide education to patient for prescribed medication. RN will report any adverse and/or side effects to prescribing provider.  Therapeutic Interventions: 1 on 1 counseling sessions, Psychoeducation, Medication administration, Evaluate responses to treatment, Monitor vital signs and CBGs as ordered, Perform/monitor CIWA, COWS, AIMS and Fall Risk screenings as ordered, Perform wound care treatments as ordered.  Evaluation of Outcomes: Progressing   LCSW Treatment Plan for Primary Diagnosis: MDD (major depressive disorder), recurrent episode ([place]) Long Term Goal(s): Safe transition to appropriate next level of care at discharge, Engage patient in therapeutic group addressing interpersonal concerns.  Short Term Goals: Engage patient in aftercare planning with referrals and resources, Increase social support, Increase ability to appropriately verbalize feelings, Increase emotional regulation, Facilitate acceptance of mental health diagnosis and concerns, Identify triggers associated with mental health/substance abuse issues, and Increase skills for wellness and recovery  Therapeutic Interventions: Assess for all discharge needs, 1 to 1 time with Social worker, Explore available resources and support systems, Assess for adequacy in community support network, Educate family and significant other(s) on suicide prevention, Complete Psychosocial Assessment, Interpersonal group therapy.  Evaluation of Outcomes: Progressing   Progress in Treatment: Attending groups: Yes. Participating in groups: Yes. Taking medication as prescribed: Yes. Toleration medication: Yes. Family/Significant other contact made: Yes, individual(s) contacted:  SPE completed with pt's sister-in-law , Candelario [place] Patient understands diagnosis: Yes. Discussing patient identified problems/goals with staff: Yes. Medical problems stabilized or resolved: Yes. Denies suicidal/homicidal ideation: Yes. Issues/concerns per patient self-inventory: No. Other: None  New problem(s) identified: No, Describe:  None  New Short Term/Long Term Goal(s): Patient to work towards medication management for mood stabilization; development of comprehensive mental wellness plan. Update 01/07/22: No changes at this time.    Patient Goals: Patient stated that he wants more sleep and work to work on his depression and eliminating his SI thoughts.No additional goals identified at this time.  Update 01/07/22: No changes at this time.    Discharge Plan or Barriers: No psychosocial barriers identified at this time, patient to return to place of residence when appropriate for discharge. CSW will assist pt in development of an appropriate discharge/aftercare plan. Update 01/07/22: No changes at this time.    Reason  for Continuation of Hospitalization: Depression, Other: Insomnia   Estimated Length of Stay: TBD   Scribe for Treatment Team: Othar Curto A [place], Latanya Presser 01/07/2022 10:32 AM

## 2022-01-07 NOTE — Progress Notes (Signed)
[facility] MD Progress Note  01/07/2022 10:56 AM [name]  MRN:  [id number] Subjective: [name] states that he may have slept a little bit better last night and his mood is better.  I reassured him that it is, take time and he needs patience.  He agrees.  His affect is a little bit better.  I told Lequita Halt to continue to titrate up his Effexor.  Principal Problem: MDD (major depressive disorder), recurrent episode (HCC) Diagnosis: Principal Problem:   MDD (major depressive disorder), recurrent episode (HCC)  Total Time spent with patient: 15 minutes  Past Psychiatric History: [facility]  Past Medical History:  Past Medical History:  Diagnosis Date   Atrial fibrillation (HCC) 08/2009   CAD (coronary artery disease)    s/p CABG -- 1992   Depression    ESRD (end stage renal disease) (HCC)    HLD (hyperlipidemia)    HTN (hypertension)    Idiopathic thrombocytopenia (HCC)     Past Surgical History:  Procedure Laterality Date   APPENDECTOMY     CORONARY ARTERY BYPASS GRAFT     FACIAL COSMETIC SURGERY     GALLBLADDER SURGERY     HERNIA REPAIR     KIDNEY TRANSPLANT     LEFT HEART CATH AND CORONARY ANGIOGRAPHY N/A 08/07/2018   Procedure: LEFT HEART CATH AND CORONARY ANGIOGRAPHY;  Surgeon: Tonny Bollman, MD;  Location: [facility] INVASIVE CV LAB;  Service: Cardiovascular;  Laterality: N/A;   LIVER TRANSPLANT     PRESSURE SENSOR/CARDIOMEMS N/A 08/04/2021   Procedure: PRESSURE SENSOR/CARDIOMEMS;  Surgeon: Laurey Morale, MD;  Location: [facility] INVASIVE CV LAB;  Service: Cardiovascular;  Laterality: N/A;   RIGHT HEART CATH N/A 06/25/2021   Procedure: RIGHT HEART CATH;  Surgeon: Laurey Morale, MD;  Location: [facility] INVASIVE CV LAB;  Service: Cardiovascular;  Laterality: N/A;   Family History:  Family History  Problem Relation Age of Onset   Atrial fibrillation Mother    Breast cancer Mother    Renal cancer Father    Hypertension Brother    Hyperlipidemia Brother    Other Brother        stent     Renal Disease Maternal Grandmother    Stroke Maternal Grandmother    Heart attack Maternal Grandfather    Cancer Paternal Grandmother    Heart failure Paternal Grandfather    Emphysema Paternal Grandfather    Bronchiolitis Paternal Grandfather     Social History:  Social History   Substance and Sexual Activity  Alcohol Use No     Social History   Substance and Sexual Activity  Drug Use No    Social History   Socioeconomic History   Marital status: Single    Spouse name: Not on file   Number of children: 0   Years of education: Not on file   Highest education level: Not on file  Occupational History   Occupation: retired  Tobacco Use   Smoking status: Never   Smokeless tobacco: Never  Vaping Use   Vaping Use: Never used  Substance and Sexual Activity   Alcohol use: No   Drug use: No   Sexual activity: Not on file  Other Topics Concern   Not on file  Social History Narrative   Not on file   Social Determinants of Health   Financial Resource Strain: Low Risk    Difficulty of Paying Living Expenses: Not very hard  Food Insecurity: No Food Insecurity   Worried About Running Out of Food in the  Last Year: Never true   Ran Out of Food in the Last Year: Never true  Transportation Needs: No Transportation Needs   Lack of Transportation (Medical): No   Lack of Transportation (Non-Medical): No  Physical Activity: Not on file  Stress: Not on file  Social Connections: Not on file   Additional Social History:                         Sleep: Poor  Appetite:  Fair  Current Medications: Current Facility-Administered Medications  Medication Dose Route Frequency Provider Last Rate Last Admin   acetaminophen (TYLENOL) tablet 650 mg  650 mg Oral Q6H PRN Maryagnes Amos, FNP   650 mg at 01/06/22 0009   alum & mag hydroxide-simeth (MAALOX/MYLANTA) 200-200-20 MG/5ML suspension 30 mL  30 mL Oral Q4H PRN Maryagnes Amos, FNP       amiodarone  (PACERONE) tablet 200 mg  200 mg Oral Daily Maryagnes Amos, FNP   200 mg at 01/07/22 0910   atorvastatin (LIPITOR) tablet 20 mg  20 mg Oral QHS Maryagnes Amos, FNP   20 mg at 01/06/22 2220   dapagliflozin propanediol (FARXIGA) tablet 10 mg  10 mg Oral Daily Otelia Sergeant, RPH   10 mg at 01/07/22 2440   LORazepam (ATIVAN) tablet 1 mg  1 mg Oral Q4H PRN Sarina Ill, DO   1 mg at 01/06/22 2220   magnesium hydroxide (MILK OF MAGNESIA) suspension 30 mL  30 mL Oral Daily PRN Maryagnes Amos, FNP       magnesium oxide (MAG-OX) tablet 400 mg  400 mg Oral BID Maryagnes Amos, FNP   400 mg at 01/07/22 0909   OLANZapine (ZYPREXA) tablet 15 mg  15 mg Oral QHS Sarina Ill, DO   15 mg at 01/06/22 2220   ondansetron (ZOFRAN) tablet 4 mg  4 mg Oral Q8H PRN He, [name], MD   4 mg at 12/27/21 0848   potassium chloride SA (KLOR-CON M) CR tablet 40 mEq  40 mEq Oral Daily Sarina Ill, DO   40 mEq at 01/07/22 0909   tacrolimus (PROGRAF) capsule 0.5 mg  0.5 mg Oral Haynes Bast, FNP   0.5 mg at 01/07/22 0909   tacrolimus (PROGRAF) capsule 1 mg  1 mg Oral Haynes Bast, FNP   1 mg at 01/06/22 0933   torsemide (DEMADEX) tablet 60 mg  60 mg Oral Daily Sarina Ill, DO   60 mg at 01/07/22 0909   [START ON 01/08/2022] venlafaxine XR (EFFEXOR-XR) 24 hr capsule 112.5 mg  112.5 mg Oral Q breakfast Sarina Ill, DO       warfarin (COUMADIN) tablet 1 mg  1 mg Oral Once per day on Sun Tue Wed Thu Fri Sat Sarina Ill, DO   1 mg at 01/06/22 1705   warfarin (COUMADIN) tablet 4 mg  4 mg Oral Q Mon-1800 Maryagnes Amos, FNP   4 mg at 01/04/22 1814   Warfarin - Physician Dosing Inpatient   Does not apply q1600 Martyn Malay, [facility]   Given at 01/06/22 1655    Lab Results:  Results for orders placed or performed during the hospital encounter of 12/25/21 (from the past 48 hour(s))  Protime-INR     Status:  Abnormal   Collection Time: 01/06/22  6:03 AM  Result Value Ref Range   Prothrombin Time 32.2 (H) 11.4 - 15.2 seconds  INR 3.1 (H) 0.8 - 1.2    Comment: (NOTE) INR goal varies based on device and disease states. Performed at [facility], [address] [place], [place] [address]   Protime-INR     Status: Abnormal   Collection Time: 01/07/22  6:23 AM  Result Value Ref Range   Prothrombin Time 30.0 (H) 11.4 - 15.2 seconds   INR 2.9 (H) 0.8 - 1.2    Comment: (NOTE) INR goal varies based on device and disease states. Performed at [facility], [address] [place], [place] [address]     Blood Alcohol level:  Lab Results  Component Value Date   [facility] <10 12/24/2021   ETH <10 11/15/2021    Metabolic Disorder Labs: Lab Results  Component Value Date   HGBA1C 5.2 11/17/2021   MPG 102.54 11/17/2021   MPG 105.41 08/05/2018   No results found for: PROLACTIN Lab Results  Component Value Date   CHOL 117 11/17/2021   TRIG 96 11/17/2021   HDL 28 (L) 11/17/2021   CHOLHDL 4.2 11/17/2021   VLDL 19 11/17/2021   LDLCALC 70 11/17/2021   LDLCALC 87 08/22/2019    Physical Findings: AIMS:  , ,  ,  ,    CIWA:    COWS:     Musculoskeletal: Strength & Muscle Tone: within normal limits Gait & Station: normal Patient leans: N/A  Psychiatric Specialty Exam:  Presentation  General Appearance: No data recorded Eye Contact:No data recorded Speech:No data recorded Speech Volume:No data recorded Handedness:No data recorded  Mood and Affect  Mood:No data recorded Affect:No data recorded  Thought Process  Thought Processes:No data recorded Descriptions of Associations:No data recorded Orientation:No data recorded Thought Content:No data recorded History of Schizophrenia/Schizoaffective disorder:No  Duration of Psychotic Symptoms:No data recorded Hallucinations:No data recorded Ideas of Reference:No data recorded Suicidal Thoughts:No data  recorded Homicidal Thoughts:No data recorded  Sensorium  Memory:No data recorded Judgment:No data recorded Insight:No data recorded  Executive Functions  Concentration:No data recorded Attention Span:No data recorded Recall:No data recorded Fund of Knowledge:No data recorded Language:No data recorded  Psychomotor Activity  Psychomotor Activity:No data recorded  Assets  Assets:No data recorded  Sleep  Sleep:No data recorded   Physical Exam: Physical Exam Vitals and nursing note reviewed.  Constitutional:      Appearance: Normal appearance. He is normal weight.  Neurological:     General: No focal deficit present.     Mental Status: He is alert and oriented to person, place, and time.  Psychiatric:        Attention and Perception: Attention and perception normal.        Mood and Affect: Mood is depressed. Affect is flat.        Speech: Speech normal.        Behavior: Behavior normal. Behavior is cooperative.        Thought Content: Thought content normal.        Cognition and Memory: Cognition and memory normal.        Judgment: Judgment normal.   Review of Systems  Constitutional: Negative.   HENT: Negative.    Eyes: Negative.   Respiratory: Negative.    Cardiovascular: Negative.   Gastrointestinal: Negative.   Genitourinary: Negative.   Musculoskeletal: Negative.   Skin: Negative.   Neurological: Negative.   Endo/Heme/Allergies: Negative.   Psychiatric/Behavioral:  Positive for depression. The patient is nervous/anxious and has insomnia.   Blood pressure 105/64, pulse 67, temperature (!) 97.5 F (36.4 C), temperature source Oral,  resp. rate 18, height 5\' 10"  (1.778 m), weight 81.2 kg, SpO2 98 %. Body mass index is 25.69 kg/m.   Treatment Plan Summary: Daily contact with patient to assess and evaluate symptoms and progress in treatment, Medication management, and Plan increase Effexor XR to 112.5 mg.  Check CBC and CMP in the morning along with PT and  INR.  Sarina Ill, DO 01/07/2022, 10:56 AM

## 2022-01-08 ENCOUNTER — Inpatient Hospital Stay: Payer: Medicare Other

## 2022-01-08 DIAGNOSIS — F333 Major depressive disorder, recurrent, severe with psychotic symptoms: Secondary | ICD-10-CM | POA: Diagnosis not present

## 2022-01-08 DIAGNOSIS — N189 Chronic kidney disease, unspecified: Principal | ICD-10-CM

## 2022-01-08 DIAGNOSIS — N289 Disorder of kidney and ureter, unspecified: Principal | ICD-10-CM

## 2022-01-08 LAB — COMPREHENSIVE METABOLIC PANEL
ALT: 11 U/L (ref 0–44)
AST: 16 U/L (ref 15–41)
Albumin: 3.1 g/dL — ABNORMAL LOW (ref 3.5–5.0)
Alkaline Phosphatase: 66 U/L (ref 38–126)
Anion gap: 7 (ref 5–15)
BUN: 24 mg/dL — ABNORMAL HIGH (ref 8–23)
CO2: 25 mmol/L (ref 22–32)
Calcium: 9.3 mg/dL (ref 8.9–10.3)
Chloride: 102 mmol/L (ref 98–111)
Creatinine, Ser: 2.98 mg/dL — ABNORMAL HIGH (ref 0.61–1.24)
GFR, Estimated: 22 mL/min — ABNORMAL LOW (ref >60)
Glucose, Bld: 97 mg/dL (ref 70–99)
Potassium: 4.7 mmol/L (ref 3.5–5.1)
Sodium: 134 mmol/L — ABNORMAL LOW (ref 135–145)
Total Bilirubin: 1.9 mg/dL — ABNORMAL HIGH (ref 0.3–1.2)
Total Protein: 6.3 g/dL — ABNORMAL LOW (ref 6.5–8.1)

## 2022-01-08 LAB — URINALYSIS, COMPLETE (UACMP) WITH MICROSCOPIC
Bacteria, UA: NONE SEEN
Bilirubin Urine: NEGATIVE
Glucose, UA: >=500 mg/dL — AB
Ketones, ur: NEGATIVE mg/dL
Leukocytes,Ua: NEGATIVE
Nitrite: NEGATIVE
Protein, ur: NEGATIVE mg/dL
Specific Gravity, Urine: 1.014 (ref 1.005–1.030)
pH: 5 (ref 5.0–8.0)

## 2022-01-08 LAB — CBC WITH DIFFERENTIAL/PLATELET
Abs Immature Granulocytes: 0.02 10*3/uL (ref 0.00–0.07)
Basophils Absolute: 0 10*3/uL (ref 0.0–0.1)
Basophils Relative: 1 %
Eosinophils Absolute: 0.2 10*3/uL (ref 0.0–0.5)
Eosinophils Relative: 6 %
HCT: 44.1 % (ref 39.0–52.0)
Hemoglobin: 14.5 g/dL (ref 13.0–17.0)
Immature Granulocytes: 1 %
Lymphocytes Relative: 28 %
Lymphs Abs: 0.9 10*3/uL (ref 0.7–4.0)
MCH: 27.4 pg (ref 26.0–34.0)
MCHC: 32.9 g/dL (ref 30.0–36.0)
MCV: 83.2 fL (ref 80.0–100.0)
Monocytes Absolute: 0.3 10*3/uL (ref 0.1–1.0)
Monocytes Relative: 8 %
Neutro Abs: 1.8 10*3/uL (ref 1.7–7.7)
Neutrophils Relative %: 56 %
Platelets: 92 10*3/uL — ABNORMAL LOW (ref 150–400)
RBC: 5.3 MIL/uL (ref 4.22–5.81)
RDW: 15.6 % — ABNORMAL HIGH (ref 11.5–15.5)
Smear Review: NORMAL
WBC: 3.2 10*3/uL — ABNORMAL LOW (ref 4.0–10.5)
nRBC: 0 % (ref 0.0–0.2)

## 2022-01-08 LAB — PROTEIN / CREATININE RATIO, URINE
Creatinine, Urine: 148 mg/dL
Protein Creatinine Ratio: 0.08 mg/mg{Cre} (ref 0.00–0.15)
Total Protein, Urine: 12 mg/dL

## 2022-01-08 LAB — PROTIME-INR
INR: 2.6 — ABNORMAL HIGH (ref 0.8–1.2)
Prothrombin Time: 27.7 seconds — ABNORMAL HIGH (ref 11.4–15.2)

## 2022-01-08 MED ORDER — TORSEMIDE 20 MG PO TABS: 20.0000 mg | ORAL_TABLET | Freq: Every day | ORAL | Status: DC

## 2022-01-08 NOTE — Assessment & Plan Note (Signed)
Patient has a history of chronic ITP. ?Platelet count is stable at 92,000 without any evidence of bleeding ?Monitor closely during this hospitalization ?

## 2022-01-08 NOTE — Assessment & Plan Note (Addendum)
Patient has a history of stage IV chronic kidney disease and is status post renal transplant. ?Noted to have worsening of his renal function when compared to baseline ?Serum creatinine shows an upward trend, 2.08 >> 2.98 ?Patient noted to have relative hypotension but is asymptomatic ?Hold Demadex and potassium ?We will request nephrology consult ?

## 2022-01-08 NOTE — Progress Notes (Signed)
Recreation Therapy Notes ? ?Date: 01/08/2022  ?  ?Time: 1:30 pm  ?  ?Location: Craft room   ?  ?Behavioral response: Appropriate ?  ?Intervention Topic:  Communication  ?  ?Discussion/Intervention:  ?Group content today was focused on communication. The group defined communication and ways to communicate with others. Individuals stated reason why communication is important and some reasons to communicate with others. Patients expressed if they thought they were good at communicating with others and ways they could improve their communication skills. The group identified important parts of communication and some experiences they have had in the past with communication. The group participated in the intervention where they had a chance to test out their communication skills and identify ways to improve their communication techniques.  ?  ?Clinical Observations/Feedback: ?Patient came to group and was focusing on what peers and staff had to say about communication. Individual was social with peers and staff while participating in the intervention.  ?Diani Jillson LRT/CTRS  ? ? ? ? ? ? ? ?Raya Mckinstry ?01/08/2022 2:41 PM ?

## 2022-01-08 NOTE — Consult Note (Addendum)
Initial Consultation Note   Patient: [name] ZOX:[id number] DOB: 1951/03/28 PCP: Raynelle Jan., MD DOA: 12/25/2021 DOS: the patient was seen and examined on 01/08/2022 Primary service: Sarina Ill  Referring physician: Dr Marlou Porch Reason for consult: Abnormal labs  Assessment/Plan:  * Acute on chronic renal insufficiency Patient has a history of stage IV chronic kidney disease and is status post renal transplant. 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 Platelet count is stable at 92,000 without any evidence of bleeding Monitor closely during this hospitalization  MDD (major depressive disorder), recurrent episode (HCC) Currently admitted to the [facility] psych unit for stabilization of depression with suicidal ideation. Further treatment plan per psychiatry Continue venlafaxine and Zyprexa  History of kidney transplant Status post renal transplant Continue immunosuppressive agent Prograf  History of liver transplant [facility]) Patient is status post liver transplant Continue Prograf  Chronic systolic CHF (congestive heart failure) (HCC) Stable and not acutely exacerbated Last 2D echocardiogram from 11/22 showed an LVEF of 45 to 50% Hold Demadex due to worsening renal function  Atrial fibrillation (HCC) Patient has a history of chronic A-fib that is rate controlled with amiodarone Continue Coumadin primary prophylaxis for an acute stroke       TRH will continue to follow the patient.  HPI: [name] is a 71 y.o. male with past medical history for coronary artery disease status post CABG, chronic A-fib on anticoagulation, depression, status  post kidney and liver transplant, history of ITP who is currently hospitalized in the [facility] psych unit for management of depression with suicidal ideations.  Medical consult has been requested for worsening renal function and medication management. Patient denies having any shortness of breath, no nausea, no vomiting, no chest pain, no leg swelling, no palpitations, no diaphoresis, no headache, no cough, no fever, no chills, no focal deficits or blurred vision   Review of Systems: As mentioned in the history of present illness. All other systems reviewed and are negative. Past Medical History:  Diagnosis Date   Atrial fibrillation (HCC) 08/2009   CAD (coronary artery disease)    s/p CABG -- 1992   Depression    ESRD (end stage renal disease) (HCC)    HLD (hyperlipidemia)    HTN (hypertension)    Idiopathic thrombocytopenia (HCC)    Past Surgical History:  Procedure Laterality Date   APPENDECTOMY     CORONARY ARTERY BYPASS GRAFT     FACIAL COSMETIC SURGERY     GALLBLADDER SURGERY     HERNIA REPAIR     KIDNEY TRANSPLANT     LEFT HEART CATH AND CORONARY ANGIOGRAPHY N/A 08/07/2018   Procedure: LEFT HEART CATH AND CORONARY ANGIOGRAPHY;  Surgeon: Tonny Bollman, MD;  Location: [facility] INVASIVE CV LAB;  Service: Cardiovascular;  Laterality: N/A;   LIVER TRANSPLANT     PRESSURE SENSOR/CARDIOMEMS N/A 08/04/2021   Procedure: PRESSURE SENSOR/CARDIOMEMS;  Surgeon: Laurey Morale, MD;  Location: [facility] INVASIVE CV LAB;  Service: Cardiovascular;  Laterality: N/A;   RIGHT HEART CATH N/A 06/25/2021   Procedure: RIGHT HEART CATH;  Surgeon: Laurey Morale, MD;  Location: [facility] INVASIVE CV LAB;  Service: Cardiovascular;  Laterality: N/A;   Social History:  reports that he has never smoked. He  has never used smokeless tobacco. He reports that he does not drink alcohol and does not use drugs.  Allergies  Allergen Reactions   Other Anaphylaxis    Spider venom   Grapefruit Concentrate Other (See Comments)    Pt  has been told not to eat grapefruit   Haloperidol Other (See Comments)    Caused the patient to be overly-sedated   Nsaids Other (See Comments)    Cannot take due to liver transplant   Rifampin Other (See Comments)    Caused flushing   Triazolam Other (See Comments)    Caused the patient to be overly-sedated    Family History  Problem Relation Age of Onset   Atrial fibrillation Mother    Breast cancer Mother    Renal cancer Father    Hypertension Brother    Hyperlipidemia Brother    Other Brother        stent    Renal Disease Maternal Grandmother    Stroke Maternal Grandmother    Heart attack Maternal Grandfather    Cancer Paternal Grandmother    Heart failure Paternal Grandfather    Emphysema Paternal Grandfather    Bronchiolitis Paternal Grandfather     Prior to Admission medications   Medication Sig Start Date End Date Taking? Authorizing Provider  allopurinol (ZYLOPRIM) 100 MG tablet Take 100 mg by mouth daily.    [provider]  amiodarone (PACERONE) 200 MG tablet Take 1 tablet (200 mg total) by mouth daily. 10/05/21   [place], Anderson Malta, FNP  ARIPiprazole (ABILIFY) 5 MG tablet Take 1 tablet (5 mg total) by mouth at bedtime. 11/26/21   Sarina Ill, DO  atorvastatin (LIPITOR) 40 MG tablet Take 1 tablet (40 mg total) by mouth at bedtime. Patient taking differently: Take 20 mg by mouth at bedtime. 09/17/21   Laurey Morale, MD  calcitRIOL (ROCALTROL) 0.25 MCG capsule Take 0.25 mcg by mouth daily.    [provider]  Cholecalciferol (VITAMIN D3) 50 MCG (2000 UT) TABS Take 2,000 Units by mouth daily.    [provider]  clonazePAM (KLONOPIN) 1 MG tablet Take 1 tablet (1 mg total) by mouth at bedtime. 11/26/21   Sarina Ill, DO  Cyanocobalamin (VITAMIN B12) 1000 MCG TBCR Take 1,000 mcg by mouth daily.    [provider]  dapagliflozin propanediol (FARXIGA) 10 MG TABS tablet Take 1 tablet (10 mg total) by mouth daily.  07/03/21   Laurey Morale, MD  EPINEPHrine (EPI-PEN) 0.3 mg/0.3 mL DEVI Inject 0.3 mg into the muscle once as needed (severe allergic reaction). Patient not taking: Reported on 12/24/2021    [provider]  ferrous sulfate 325 (65 FE) MG tablet Take 325 mg by mouth daily with breakfast.    [provider]  fish oil-omega-3 fatty acids 1000 MG capsule Take 1 g by mouth 2 (two) times daily.    [provider]  MAGNESIUM-OXIDE 400 (240 Mg) MG tablet Take 400 mg by mouth 2 (two) times daily. 05/04/21   [provider]  mirtazapine (REMERON) 15 MG tablet Take 1 tablet (15 mg total) by mouth at bedtime. 11/26/21   Sarina Ill, DO  nitroGLYCERIN (NITROSTAT) 0.4 MG SL tablet Place 0.4 mg under the tongue every 5 (five) minutes as needed for chest pain. Patient not taking: Reported on 12/24/2021    [provider]  potassium chloride SA (KLOR-CON) 20 MEQ tablet Take 2 tablets (40 mEq total) by mouth 3 (three) times daily. 08/04/21  Laurey Morale, MD  tacrolimus (PROGRAF) 0.5 MG capsule Take 0.5 mg by mouth See admin instructions. Takes 0.5 mg  tablet by mouth one day and 1 mg tablets next day (1 and 2 tablets on alternate days)    [provider]  torsemide (DEMADEX) 20 MG tablet Take 5 tablets (100 mg total) by mouth daily. 10/05/21   Jacklynn Ganong, FNP  traZODone (DESYREL) 100 MG tablet Take 1 tablet (100 mg total) by mouth at bedtime as needed for sleep. 11/26/21   Sarina Ill, DO  warfarin (COUMADIN) 4 MG tablet TAKE AS DIRECTED BY COUMADIN CLINIC Patient taking differently: Take 2-4 mg by mouth See admin instructions. Take 4mg  by mouth on Monday, then take 2mg  (1/2 tablet) all other days 09/19/20   Duke Salvia, MD    Physical Exam: Vitals:   01/07/22 0727 01/07/22 1722 01/07/22 1957 01/08/22 0735  BP: 105/64 (!) 83/53 (!) 81/60 (!) 106/59  Pulse: 67 78 91 71  Resp: 18 18  18   Temp: (!) 97.5 F (36.4 C) 97.8 F  (36.6 C) 97.9 F (36.6 C) (!) 97.5 F (36.4 C)  TempSrc: Oral Oral Oral Oral  SpO2: 98% (!) 73% 99% 94%  Weight:      Height:       Physical Exam Vitals and nursing note reviewed.  Constitutional:      Appearance: Normal appearance. He is normal weight.  HENT:     Head: Normocephalic.     Nose: Nose normal.     Mouth/Throat:     Mouth: Mucous membranes are moist.  Eyes:     Pupils: Pupils are equal, round, and reactive to light.  Cardiovascular:     Rate and Rhythm: Normal rate and regular rhythm.  Pulmonary:     Effort: Pulmonary effort is normal.  Abdominal:     General: Bowel sounds are normal.     Palpations: Abdomen is soft.     Comments: Central adiposity  Musculoskeletal:        General: Normal range of motion.     Cervical back: Normal range of motion and neck supple.  Skin:    General: Skin is warm and dry.  Neurological:     General: No focal deficit present.     Mental Status: He is alert and oriented to person, place, and time.  Psychiatric:        Mood and Affect: Mood normal.        Behavior: Behavior normal.     Data Reviewed:  Relevant notes from primary care and specialist visits, past discharge summaries as available in EHR, including Care Everywhere. Prior diagnostic testing as pertinent to current admission diagnoses Updated medications and problem lists for reconciliation ED course, including vitals, labs, imaging, treatment and response to treatment Triage notes, nursing and pharmacy notes and ED provider's notes Notable results as noted in HPI Labs reviewed White count 3.2, hemoglobin 14.5, hematocrit 44.1, platelet count 90, sodium 134, BUN 24, creatinine 2.98 compared to baseline of 2.20, PT 27.7, INR 2.6 There are no new results to review at this time.  Family Communication: Greater than 50% of time was spent discussing plan of care with patient at the bedside.  All questions and concerns have been addressed.  He verbalizes understanding  and agrees with the plan. Primary team communication:  Thank you very much for involving [place] in the care of your patient.  Author: Lucile Shutters, MD 01/08/2022 12:48 PM  For on call  review [email or web address].

## 2022-01-08 NOTE — Assessment & Plan Note (Signed)
Currently admitted to the [facility] psych unit for stabilization of depression with suicidal ideation. ?Further treatment plan per psychiatry ?Continue venlafaxine and Zyprexa ?

## 2022-01-08 NOTE — Assessment & Plan Note (Signed)
Patient has a history of chronic A-fib that is rate controlled with amiodarone ?Continue Coumadin primary prophylaxis for an acute stroke ?
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
## 2022-01-08 NOTE — Plan of Care (Signed)
Patient remain alert and oriented with flat affect and depressed mood. Calm and cooperative during assessment. Patient denies SI, HI, and AVH. Report pain of 5/10. Patient report sleeping on and off. Report appetite good. Ate breakfast in the day room among staff and peers and tolerated well. Remain safe on the unit with Q 15 minute safety check. ? ?Problem: Education: ?Goal: Knowledge of [facility] Education information/materials will improve ?Outcome: Progressing ?Goal: Emotional status will improve ?Outcome: Progressing ?Goal: Mental status will improve ?Outcome: Progressing ?Goal: Verbalization of understanding the information provided will improve ?Outcome: Progressing ?  ?Problem: Activity: ?Goal: Interest or engagement in activities will improve ?Outcome: Progressing ?Goal: Sleeping patterns will improve ?Outcome: Progressing ?  ?Problem: Coping: ?Goal: Ability to verbalize frustrations and anger appropriately will improve ?Outcome: Progressing ?Goal: Ability to demonstrate self-control will improve ?Outcome: Progressing ?  ?Problem: Health Behavior/Discharge Planning: ?Goal: Identification of resources available to assist in meeting health care needs will improve ?Outcome: Progressing ?Goal: Compliance with treatment plan for underlying cause of condition will improve ?Outcome: Progressing ?  ?Problem: Physical Regulation: ?Goal: Ability to maintain clinical measurements within normal limits will improve ?Outcome: Progressing ?  ?Problem: Safety: ?Goal: Periods of time without injury will increase ?Outcome: Progressing ?  ?Problem: Education: ?Goal: Utilization of techniques to improve thought processes will improve ?Outcome: Progressing ?Goal: Knowledge of the prescribed therapeutic regimen will improve ?Outcome: Progressing ?  ?Problem: Activity: ?Goal: Interest or engagement in leisure activities will improve ?Outcome: Progressing ?Goal: Imbalance in normal sleep/wake cycle will improve ?Outcome:  Progressing ?  ?Problem: Coping: ?Goal: Coping ability will improve ?Outcome: Progressing ?Goal: Will verbalize feelings ?Outcome: Progressing ?  ?Problem: Health Behavior/Discharge Planning: ?Goal: Ability to make decisions will improve ?Outcome: Progressing ?Goal: Compliance with therapeutic regimen will improve ?Outcome: Progressing ?  ?Problem: Role Relationship: ?Goal: Will demonstrate positive changes in social behaviors and relationships ?Outcome: Progressing ?  ?Problem: Safety: ?Goal: Ability to disclose and discuss suicidal ideas will improve ?Outcome: Progressing ?Goal: Ability to identify and utilize support systems that promote safety will improve ?Outcome: Progressing ?  ?Problem: Self-Concept: ?Goal: Will verbalize positive feelings about self ?Outcome: Progressing ?Goal: Level of anxiety will decrease ?Outcome: Progressing ?  ?Problem: Education: ?Goal: Ability to make informed decisions regarding treatment will improve ?Outcome: Progressing ?  ?Problem: Coping: ?Goal: Coping ability will improve ?Outcome: Progressing ?  ?Problem: Health Behavior/Discharge Planning: ?Goal: Identification of resources available to assist in meeting health care needs will improve ?Outcome: Progressing ?  ?Problem: Medication: ?Goal: Compliance with prescribed medication regimen will improve ?Outcome: Progressing ?  ?Problem: Self-Concept: ?Goal: Ability to disclose and discuss suicidal ideas will improve ?Outcome: Progressing ?Goal: Will verbalize positive feelings about self ?Outcome: Progressing ?  ?Problem: Education: ?Goal: Ability to state activities that reduce stress will improve ?Outcome: Progressing ?  ?Problem: Coping: ?Goal: Ability to identify and develop effective coping behavior will improve ?Outcome: Progressing ?  ?Problem: Self-Concept: ?Goal: Ability to identify factors that promote anxiety will improve ?Outcome: Progressing ?Goal: Level of anxiety will decrease ?Outcome: Progressing ?Goal: Ability to  modify response to factors that promote anxiety will improve ?Outcome: Progressing ?  ?

## 2022-01-08 NOTE — Assessment & Plan Note (Signed)
Status post renal transplant ?Continue immunosuppressive agent Prograf ?

## 2022-01-08 NOTE — Assessment & Plan Note (Signed)
Stable and not acutely exacerbated ?Last 2D echocardiogram from 11/22 showed an LVEF of 45 to 50% ?Hold Demadex due to worsening renal function ?

## 2022-01-08 NOTE — Plan of Care (Signed)
Patient presents A&Ox4.  Affect is depressed and sad.  Endorses anxiety 8/10 which he states is down from 10/10 after evening dose of Ativan.  Reports depression 5/10.  Denies AVH, SI, HI or pain.  Patient spent the evening in day room watching TV.  Patient remains safe on unit with Q 15 min safety checks. ? ?Problem: Education: ?Goal: Knowledge of [facility] Education information/materials will improve ?Outcome: Progressing ?Goal: Mental status will improve ?Outcome: Progressing ?Goal: Verbalization of understanding the information provided will improve ?Outcome: Progressing ?  ?Problem: Activity: ?Goal: Interest or engagement in activities will improve ?Outcome: Progressing ?  ?Problem: Health Behavior/Discharge Planning: ?Goal: Compliance with treatment plan for underlying cause of condition will improve ?Outcome: Progressing ?  ?

## 2022-01-08 NOTE — Assessment & Plan Note (Signed)
Patient is status post liver transplant ?Continue Prograf ?

## 2022-01-08 NOTE — Consult Note (Signed)
[facility]  CONSULT NOTE    Date: 01/08/2022                  Patient Name:  [name]  MRN: [id number]  DOB: Mar 23, 1951  Age / Sex: 71 y.o., male         PCP: [name]., MD                 Service Requesting Consult: Dr. Louis Meckel                 Reason for Consult: Acute kidney injury            History of Present Illness: [name] admitted for depression. He is feeling tired and not eating well. Internal medicine for acute kidney injury. His torsemide was held and nephrology was called.   Patient states he is taking all his medications. However does endorse not eating or drinking well. Found to have low blood pressures.   Patient had liver and kidney transplant in 2008. Current immunosuppression with tacrolimus 1.5mg  every other day. Previously also on prednisone.    Medications: Outpatient medications: Medications Prior to Admission  Medication Sig Dispense Refill Last Dose   allopurinol (ZYLOPRIM) 100 MG tablet Take 100 mg by mouth daily.      amiodarone (PACERONE) 200 MG tablet Take 1 tablet (200 mg total) by mouth daily. 30 tablet 5    ARIPiprazole (ABILIFY) 5 MG tablet Take 1 tablet (5 mg total) by mouth at bedtime. 30 tablet 2    atorvastatin (LIPITOR) 40 MG tablet Take 1 tablet (40 mg total) by mouth at bedtime. (Patient taking differently: Take 20 mg by mouth at bedtime.) 90 tablet 3    calcitRIOL (ROCALTROL) 0.25 MCG capsule Take 0.25 mcg by mouth daily.      Cholecalciferol (VITAMIN D3) 50 MCG (2000 UT) TABS Take 2,000 Units by mouth daily.      clonazePAM (KLONOPIN) 1 MG tablet Take 1 tablet (1 mg total) by mouth at bedtime. 30 tablet 2    Cyanocobalamin (VITAMIN B12) 1000 MCG TBCR Take 1,000 mcg by mouth daily.      dapagliflozin propanediol (FARXIGA) 10 MG TABS tablet Take 1 tablet (10 mg total) by mouth daily. 30 tablet 6    EPINEPHrine (EPI-PEN) 0.3 mg/0.3 mL DEVI Inject 0.3 mg into the muscle once as  needed (severe allergic reaction). (Patient not taking: Reported on 12/24/2021)      ferrous sulfate 325 (65 FE) MG tablet Take 325 mg by mouth daily with breakfast.      fish oil-omega-3 fatty acids 1000 MG capsule Take 1 g by mouth 2 (two) times daily.      MAGNESIUM-OXIDE 400 (240 Mg) MG tablet Take 400 mg by mouth 2 (two) times daily.      mirtazapine (REMERON) 15 MG tablet Take 1 tablet (15 mg total) by mouth at bedtime. 30 tablet 2    nitroGLYCERIN (NITROSTAT) 0.4 MG SL tablet Place 0.4 mg under the tongue every 5 (five) minutes as needed for chest pain. (Patient not taking: Reported on 12/24/2021)      potassium chloride SA (KLOR-CON) 20 MEQ tablet Take 2 tablets (40 mEq total) by mouth 3 (three) times daily. 90 tablet 6    tacrolimus (PROGRAF) 0.5 MG capsule Take 0.5 mg by mouth See admin instructions. Takes 0.5 mg  tablet by mouth one day and 1 mg tablets next day (1 and 2 tablets on alternate days)  torsemide (DEMADEX) 20 MG tablet Take 5 tablets (100 mg total) by mouth daily. 210 tablet 6    traZODone (DESYREL) 100 MG tablet Take 1 tablet (100 mg total) by mouth at bedtime as needed for sleep. 30 tablet 2    warfarin (COUMADIN) 4 MG tablet TAKE AS DIRECTED BY COUMADIN CLINIC (Patient taking differently: Take 2-4 mg by mouth See admin instructions. Take 4mg  by mouth on Monday, then take 2mg  (1/2 tablet) all other days) 65 tablet 1     Current medications: Current Facility-Administered Medications  Medication Dose Route Frequency Provider Last Rate Last Admin   acetaminophen (TYLENOL) tablet 650 mg  650 mg Oral Q6H PRN Suella Broad, FNP   650 mg at 01/08/22 0801   alum & mag hydroxide-simeth (MAALOX/MYLANTA) 200-200-20 MG/5ML suspension 30 mL  30 mL Oral Q4H PRN Suella Broad, FNP       amiodarone (PACERONE) tablet 200 mg  200 mg Oral Daily Suella Broad, FNP   200 mg at 01/08/22 0948   atorvastatin (LIPITOR) tablet 20 mg  20 mg Oral QHS Suella Broad, FNP   20 mg at 01/07/22 2225   LORazepam (ATIVAN) tablet 1 mg  1 mg Oral Q4H PRN Parks Ranger, DO   1 mg at 01/07/22 2225   magnesium hydroxide (MILK OF MAGNESIA) suspension 30 mL  30 mL Oral Daily PRN Suella Broad, FNP       magnesium oxide (MAG-OX) tablet 400 mg  400 mg Oral BID Suella Broad, FNP   400 mg at 01/08/22 0945   OLANZapine (ZYPREXA) tablet 15 mg  15 mg Oral QHS Parks Ranger, DO   15 mg at 01/07/22 2224   ondansetron (ZOFRAN) tablet 4 mg  4 mg Oral Q8H PRN He, [name], MD   4 mg at 12/27/21 0848   tacrolimus (PROGRAF) capsule 0.5 mg  0.5 mg Oral [name], FNP   0.5 mg at 01/07/22 0909   tacrolimus (PROGRAF) capsule 1 mg  1 mg Oral [name], FNP   1 mg at 01/08/22 0945   venlafaxine XR (EFFEXOR-XR) 24 hr capsule 112.5 mg  112.5 mg Oral Q breakfast Parks Ranger, DO   112.5 mg at 01/08/22 0800   warfarin (COUMADIN) tablet 1 mg  1 mg Oral Once per day on Sun Tue Wed Thu Fri Sat Parks Ranger, DO   1 mg at 01/07/22 1702   warfarin (COUMADIN) tablet 4 mg  4 mg Oral Q Mon-1800 Suella Broad, FNP   4 mg at 01/04/22 1814   Warfarin - Physician Dosing Inpatient   Does not apply q1600 [name], [facility]   Given at 01/07/22 1702      Allergies: Allergies  Allergen Reactions   Other Anaphylaxis    Spider venom   Grapefruit Concentrate Other (See Comments)    Pt has been told not to eat grapefruit   Haloperidol Other (See Comments)    Caused the patient to be overly-sedated   Nsaids Other (See Comments)    Cannot take due to liver transplant   Rifampin Other (See Comments)    Caused flushing   Triazolam Other (See Comments)    Caused the patient to be overly-sedated      Past Medical History: Past Medical History:  Diagnosis Date   Atrial fibrillation ([place]) 08/2009   CAD (coronary artery disease)    s/p CABG -- 1992   Depression  ESRD (end stage renal disease)  ([place])    HLD (hyperlipidemia)    HTN (hypertension)    Idiopathic thrombocytopenia ([facility])      Past Surgical History: Past Surgical History:  Procedure Laterality Date   APPENDECTOMY     CORONARY ARTERY BYPASS GRAFT     FACIAL COSMETIC SURGERY     GALLBLADDER SURGERY     HERNIA REPAIR     KIDNEY TRANSPLANT     LEFT HEART CATH AND CORONARY ANGIOGRAPHY N/A 08/07/2018   Procedure: LEFT HEART CATH AND CORONARY ANGIOGRAPHY;  Surgeon: Sherren Mocha, MD;  Location: [place] [facility];  Service: Cardiovascular;  Laterality: N/A;   LIVER TRANSPLANT     PRESSURE SENSOR/CARDIOMEMS N/A 08/04/2021   Procedure: PRESSURE SENSOR/CARDIOMEMS;  Surgeon: Larey Dresser, MD;  Location: [place] [facility];  Service: Cardiovascular;  Laterality: N/A;   RIGHT HEART CATH N/A 06/25/2021   Procedure: RIGHT HEART CATH;  Surgeon: Larey Dresser, MD;  Location: [place] [facility];  Service: Cardiovascular;  Laterality: N/A;     Family History: Family History  Problem Relation Age of Onset   Atrial fibrillation Mother    Breast cancer Mother    Renal cancer Father    Hypertension Brother    Hyperlipidemia Brother    Other Brother        stent    Renal Disease Maternal Grandmother    Stroke Maternal Grandmother    Heart attack Maternal Grandfather    Cancer Paternal Grandmother    Heart failure Paternal Grandfather    Emphysema Paternal Grandfather    Bronchiolitis Paternal Grandfather      Social History: Social History   Socioeconomic History   Marital status: Single    Spouse name: Not on file   Number of children: 0   Years of education: Not on file   Highest education level: Not on file  Occupational History   Occupation: retired  Tobacco Use   Smoking status: Never   Smokeless tobacco: Never  Vaping Use   Vaping Use: Never used  Substance and Sexual Activity   Alcohol use: No   Drug use: No   Sexual activity: Not on file  Other Topics Concern   Not on file  Social History  Narrative   Not on file   Social Determinants of Health   Financial Resource Strain: Low Risk    Difficulty of Paying Living Expenses: Not very hard  Food Insecurity: No Food Insecurity   Worried About Charity fundraiser in the Last Year: Never true   Ran Out of Food in the Last Year: Never true  Transportation Needs: No Transportation Needs   Lack of Transportation (Medical): No   Lack of Transportation (Non-Medical): No  Physical Activity: Not on file  Stress: Not on file  Social Connections: Not on file  Intimate Partner Violence: Not on file     Review of Systems: Review of Systems  Constitutional: Negative.   HENT: Negative.    Eyes: Negative.   Respiratory:  Negative for cough, hemoptysis, sputum production, shortness of breath and wheezing.   Cardiovascular:  Negative for chest pain, palpitations, orthopnea, claudication, leg swelling and PND.  Gastrointestinal: Negative.   Genitourinary:  Negative for dysuria, flank pain, frequency, hematuria and urgency.  Musculoskeletal:  Negative for back pain, falls, joint pain, myalgias and neck pain.  Skin: Negative.   Neurological: Negative.   Endo/Heme/Allergies: Negative.   Psychiatric/Behavioral:  Positive for depression.    Vital Signs:  Blood pressure (!) 106/59, pulse 71, temperature (!) 97.5 F (36.4 C), temperature source Oral, resp. rate 18, height 5\' 10"  (1.778 m), weight 81.2 kg, SpO2 94 %.  Weight trends: Filed Weights   12/25/21 2145  Weight: 81.2 kg    Physical Exam: General: NAD, laying in bed  Head: Normocephalic, atraumatic. Moist oral mucosal membranes  Eyes: Anicteric, PERRL  Neck: Supple, trachea midline  Lungs:  Clear to auscultation  Heart: irregular  Abdomen:  Soft, nontender,   Extremities:  no peripheral edema.  Neurologic: Nonfocal, moving all four extremities  Skin: No lesions, dry  Access: Right forearm AVF +bruit +thrill     Lab results: Basic Metabolic Panel: Recent Labs  Lab  01/04/22 0714 01/08/22 0623  NA 142 134*  K 5.3* 4.7  CL 105 102  CO2 27 25  GLUCOSE 96 97  BUN 18 24*  CREATININE 2.78* 2.98*  CALCIUM 9.1 9.3    Liver Function Tests: Recent Labs  Lab 01/08/22 0623  AST 16  ALT 11  ALKPHOS 66  BILITOT 1.9*  PROT 6.3*  ALBUMIN 3.1*   No results for input(s): LIPASE, AMYLASE in the last 168 hours. No results for input(s): AMMONIA in the last 168 hours.  CBC: Recent Labs  Lab 01/08/22 0623  WBC 3.2*  NEUTROABS 1.8  HGB 14.5  HCT 44.1  MCV 83.2  PLT 92*    Cardiac Enzymes: No results for input(s): CKTOTAL, CKMB, CKMBINDEX, TROPONINI in the last 168 hours.  BNP: Invalid input(s): POCBNP  CBG: No results for input(s): GLUCAP in the last 168 hours.  Microbiology: Results for orders placed or performed during the hospital encounter of 12/24/21  Resp Panel by RT-PCR (Flu A&B, Covid) Urine, Clean Catch     Status: None   Collection Time: 12/24/21  6:30 PM   Specimen: Urine, Clean Catch; Nasopharyngeal(NP) swabs in vial transport medium  Result Value Ref Range Status   SARS Coronavirus 2 by RT PCR NEGATIVE NEGATIVE Final    Comment: (NOTE) SARS-CoV-2 target nucleic acids are NOT DETECTED.  The SARS-CoV-2 RNA is generally detectable in upper respiratory specimens during the acute phase of infection. The lowest concentration of SARS-CoV-2 viral copies this assay can detect is 138 copies/mL. A negative result does not preclude SARS-Cov-2 infection and should not be used as the sole basis for treatment or other patient management decisions. A negative result may occur with  improper specimen collection/handling, submission of specimen other than nasopharyngeal swab, presence of viral mutation(s) within the areas targeted by this assay, and inadequate number of viral copies(<138 copies/mL). A negative result must be combined with clinical observations, patient history, and epidemiological information. The expected result is  Negative.  Fact Sheet for Patients:  EntrepreneurPulse.com.au  Fact Sheet for Healthcare Providers:  IncredibleEmployment.be  This test is no t yet approved or cleared by the [place] FDA and  has been authorized for detection and/or diagnosis of SARS-CoV-2 by FDA under an Emergency Use Authorization (EUA). This EUA will remain  in effect (meaning this test can be used) for the duration of the COVID-19 declaration under Section 564(b)(1) of the Act, 21 U.S.C.section 360bbb-3(b)(1), unless the authorization is terminated  or revoked sooner.       Influenza A by PCR NEGATIVE NEGATIVE Final   Influenza B by PCR NEGATIVE NEGATIVE Final    Comment: (NOTE) The Xpert Xpress SARS-CoV-2/FLU/RSV plus assay is intended as an aid in the diagnosis of influenza from Nasopharyngeal swab specimens and should not  be used as a sole basis for treatment. Nasal washings and aspirates are unacceptable for Xpert Xpress SARS-CoV-2/FLU/RSV testing.  Fact Sheet for Patients: EntrepreneurPulse.com.au  Fact Sheet for Healthcare Providers: IncredibleEmployment.be  This test is not yet approved or cleared by the [place] FDA and has been authorized for detection and/or diagnosis of SARS-CoV-2 by FDA under an Emergency Use Authorization (EUA). This EUA will remain in effect (meaning this test can be used) for the duration of the COVID-19 declaration under Section 564(b)(1) of the Act, 21 U.S.C. section 360bbb-3(b)(1), unless the authorization is terminated or revoked.  Performed at [facility], [place] [address], [place],  [address]     Coagulation Studies: Recent Labs    01/06/22 0603 01/07/22 0623 01/08/22 0623  LABPROT 32.2* 30.0* 27.7*  INR 3.1* 2.9* 2.6*    Urinalysis: No results for input(s): COLORURINE, LABSPEC, PHURINE, GLUCOSEU, HGBUR, BILIRUBINUR, KETONESUR, PROTEINUR,  UROBILINOGEN, NITRITE, LEUKOCYTESUR in the last 72 hours.  Invalid input(s): APPERANCEUR    Imaging: No results found.   Assessment & Plan: [name] is a 71 y.o. white male with end stage renal disease status post renal transplant for 15 years, status post liver transplant, chronic congestive heart failure, atrial fibrillation, coronary artery disease status post CABG, hyperlipidemia, depression, hypertension, idiopathic thrombocytopenia, who was admitted to [facility] on 12/25/2021 for MDD (major depressive disorder), recurrent episode ([place]) [F33.9]  Acute kidney injury on chronic kidney disease stage IIIB-T: baseline creatinine of 2.14, GFR of 33 on 10/05/21. Followed by Dr. Moshe Cipro, [facility] - history consistent with prerenal azotemia - agree with holding torsemide - holding dapagliflozin - check tacrolimus level - Check urine studies.   Hypertension: with chronic kidney disease: 106/59.  - holding torsemide.   Hyponatremia: with hypovolemia. Encourage PO intake.   Secondary Hyperparathyroidism: currently not on a vitamin D agent.      LOS: [place] 3/3/202312:50 PM

## 2022-01-09 DIAGNOSIS — F333 Major depressive disorder, recurrent, severe with psychotic symptoms: Secondary | ICD-10-CM | POA: Diagnosis not present

## 2022-01-09 LAB — RENAL FUNCTION PANEL
Albumin: 3 g/dL — ABNORMAL LOW (ref 3.5–5.0)
Anion gap: 6 (ref 5–15)
BUN: 22 mg/dL (ref 8–23)
CO2: 29 mmol/L (ref 22–32)
Calcium: 9.2 mg/dL (ref 8.9–10.3)
Chloride: 100 mmol/L (ref 98–111)
Creatinine, Ser: 2.88 mg/dL — ABNORMAL HIGH (ref 0.61–1.24)
GFR, Estimated: 23 mL/min — ABNORMAL LOW (ref >60)
Glucose, Bld: 95 mg/dL (ref 70–99)
Phosphorus: 3.3 mg/dL (ref 2.5–4.6)
Potassium: 4.6 mmol/L (ref 3.5–5.1)
Sodium: 135 mmol/L (ref 135–145)

## 2022-01-09 MED ORDER — LORAZEPAM 1 MG PO TABS
2.0000 mg | ORAL_TABLET | Freq: Every evening | ORAL | Status: DC | PRN
  Administered 2022-01-09 – 2022-01-18 (×9): 2 mg via ORAL
  Filled 2022-01-09 (×9): qty 2

## 2022-01-09 NOTE — Progress Notes (Signed)
[name] ? ?MRN: [id number] ? ?DOB/AGE: 1950/12/30 71 y.o. ? ?Primary Care Physician:Spry, Marsh Dolly., MD ? ?Admit date: 12/25/2021 ? ?Chief Complaint: No chief complaint on file. ? ? ?S-Pt presented on  12/25/2021 with No chief complaint on file. ?. ? Patient seen today in the behavioral unit ?Patient resting comfortably on the bed ?Patient offers no new specific physical complaints ? ?Medications ? ? ? amiodarone  200 mg Oral Daily  ? atorvastatin  20 mg Oral QHS  ? magnesium oxide  400 mg Oral BID  ? OLANZapine  15 mg Oral QHS  ? tacrolimus  0.5 mg Oral QODAY  ? tacrolimus  1 mg Oral QODAY  ? venlafaxine XR  112.5 mg Oral Q breakfast  ? warfarin  1 mg Oral Once per day on Sun Tue Wed Thu Fri Sat  ? warfarin  4 mg Oral Q Mon-1800  ? Warfarin - Physician Dosing Inpatient   Does not apply q1600  ? ? ? ? ? ? ? ?QQI:WLNLG from the symptoms mentioned above,there are no other symptoms referable to all systems reviewed. ? ?Physical Exam: ?Vital signs in last 24 hours: ?Temp:  [98 ?F (36.7 ?C)-98.2 ?F (36.8 ?C)] 98.2 ?F (36.8 ?C) (03/04 1103) ?Pulse Rate:  [72-76] 72 (03/04 1103) ?Resp:  [17-18] 18 (03/04 1103) ?BP: (101)/(71-72) 101/71 (03/04 1103) ?SpO2:  [94 %-100 %] 94 % (03/04 1103) ?Weight change:  ?Last BM Date : 01/06/22 ? ?Intake/Output from previous day: ?No intake/output data recorded. ?No intake/output data recorded. ? ? ?Physical Exam: ? ?General- pt is awake,alert, oriented to time place and person ? ?Resp- No acute REsp distress, CTA B/L NO Rhonchi ? ?CVS- S1S2 irregular in rate and rhythm ? ?GIT- BS+, soft, Non tender , Non distended ? ?EXT- No LE Edema,  No Cyanosis ? ?Access- RUE  AVF ? ?Lab Results: ? ?CBC ? ?Recent Labs  ?  01/08/22 ?0623  ?WBC 3.2*  ?HGB 14.5  ?HCT 44.1  ?PLT 92*  ? ? ?BMET ? ?Recent Labs  ?  01/08/22 ?0623 01/09/22 ?0640  ?NA 134* 135  ?K 4.7 4.6  ?CL 102 100  ?CO2 25 29  ?GLUCOSE 97 95  ?BUN 24* 22  ?CREATININE 2.98* 2.88*  ?CALCIUM 9.3 9.2  ? ? ? ? ?Most recent Creatinine  trend  ?Lab Results  ?Component Value Date  ? CREATININE 2.88 (H) 01/09/2022  ? CREATININE 2.98 (H) 01/08/2022  ? CREATININE 2.78 (H) 01/04/2022  ?  ? ? ?MICRO ? ? ?No results found for this or any previous visit (from the past 240 hour(s)).  ? ? ? ?Renal ultrasound ?IMPRESSION: ?1. Right lower quadrant transplant kidney, with increased renal ?cortical echotexture consistent with medical renal disease. ?2. Equivocal resistive index within the upper pole, with normal ?resistive index in the lower pole. ?3. Trace perirenal fluid along the upper pole of the transplant ?kidney. ? ? ?Impression: ? ? ?[name] is a 71 y.o. white male with end stage renal disease status post renal transplant for 15 years, status post liver transplant, chronic congestive heart failure, atrial fibrillation, coronary artery disease status post CABG, hyperlipidemia, depression, hypertension, idiopathic thrombocytopenia, who was admitted to [facility] on 12/25/2021 for MDD (major depressive disorder), recurrent episode ([place]) [F33.9] ?  ? ? ?1)Renal   ? ?Acute kidney injury on chronic kidney disease stage IIIB-T: baseline creatinine of 2.14, GFR of 33 on 10/05/21. Followed by Dr. Moshe Cipro, [facility] ?Patient creatinine is at plateau ?We will continue to  hold patient's diuretics and SGLT2 receptor blockers ?Patient's tacrolimus level is pending ? ?2)HTN ? ?Blood pressure is stable  ? ? ?3)Anemia -none ? ?CBC Latest Ref Rng & Units 01/08/2022 12/27/2021 12/24/2021  ?WBC 4.0 - 10.5 K/uL 3.2(L) 4.9 6.1  ?Hemoglobin 13.0 - 17.0 g/dL 14.5 15.8 15.4  ?Hematocrit 39.0 - 52.0 % 44.1 49.3 48.2  ?Platelets 150 - 400 K/uL 92(L) 73(L) 76(L)  ?  ? ? ? HGb at goal (9--11) ? ? ?4) Secondary hyperparathyroidism -CKD Mineral-Bone Disorder ? ? ? ?Lab Results  ?Component Value Date  ? CALCIUM 9.2 01/09/2022  ? CAION 1.27 06/25/2021  ? CAION 1.28 06/25/2021  ? PHOS 3.3 01/09/2022  ? ? ?Secondary Hyperparathyroidism absent. ? ?Phosphorus at  goal. ? ? ?5)Thrombocytopenia ?Patient platelet counts are low but stable ? ? ?6) Electrolytes  ? ?BMP Latest Ref Rng & Units 01/09/2022 01/08/2022 01/04/2022  ?Glucose 70 - 99 mg/dL 95 97 96  ?BUN 8 - 23 mg/dL 22 24(H) 18  ?Creatinine 0.61 - 1.24 mg/dL 2.88(H) 2.98(H) 2.78(H)  ?BUN/Creat Ratio 10 - 24 - - -  ?Sodium 135 - 145 mmol/L 135 134(L) 142  ?Potassium 3.5 - 5.1 mmol/L 4.6 4.7 5.3(H)  ?Chloride 98 - 111 mmol/L 100 102 105  ?CO2 22 - 32 mmol/L '29 25 27  '$ ?Calcium 8.9 - 10.3 mg/dL 9.2 9.3 9.1  ?  ? ?Sodium ?Hyponatremic ?Now better ? ?Potassium ?Hyperkalemic ?Now better ? ? ?7)Acid base ? ? ? ?Co2 at goal ? ? ? ? ?Plan: ? ?We will continue patient current treatment plan ?We will continue to hold patient's diuretics ?Platelet counts are low but stable ?Awaiting tacrolimus levels ? ? ? ? ? ?Roxas Clymer s Theador Hawthorne ?01/09/2022, 3:24 PM  ?

## 2022-01-09 NOTE — Progress Notes (Signed)
[facility] MD Progress Note  01/09/2022 11:12 AM Avier Jech  MRN:  [id number] Subjective: [name] is seen and examined today.  He is still complaining of not sleeping very well.  Social work is making him an appointment at [employer].  He is being made to get something for sleep.  He has been on numerous medications and he cannot be on tricyclic.  I will go up on his Ativan for now.  His mood is improving.  Principal Problem: Acute on chronic renal insufficiency Diagnosis: Principal Problem:   Acute on chronic renal insufficiency Active Problems:   Atrial fibrillation (HCC)   Chronic systolic CHF (congestive heart failure) (HCC)   History of liver transplant ([place])   History of kidney transplant   Chronic ITP (idiopathic thrombocytopenic purpura) (HCC)   MDD (major depressive disorder), recurrent episode ([place])  Total Time spent with patient: 15 minutes  Past Psychiatric History: Yes  Past Medical History:  Past Medical History:  Diagnosis Date   Atrial fibrillation ([place]) 08/2009   CAD (coronary artery disease)    s/p CABG -- 1992   Depression    ESRD (end stage renal disease) ([place])    HLD (hyperlipidemia)    HTN (hypertension)    Idiopathic thrombocytopenia (HCC)     Past Surgical History:  Procedure Laterality Date   APPENDECTOMY     CORONARY ARTERY BYPASS GRAFT     FACIAL COSMETIC SURGERY     GALLBLADDER SURGERY     HERNIA REPAIR     KIDNEY TRANSPLANT     LEFT HEART CATH AND CORONARY ANGIOGRAPHY N/A 08/07/2018   Procedure: LEFT HEART CATH AND CORONARY ANGIOGRAPHY;  Surgeon: Sherren Mocha, MD;  Location: [facility];  Service: Cardiovascular;  Laterality: N/A;   LIVER TRANSPLANT     PRESSURE SENSOR/CARDIOMEMS N/A 08/04/2021   Procedure: PRESSURE SENSOR/CARDIOMEMS;  Surgeon: Larey Dresser, MD;  Location: [place] [facility];  Service: Cardiovascular;  Laterality: N/A;   RIGHT HEART CATH N/A 06/25/2021   Procedure: RIGHT HEART CATH;  Surgeon: Larey Dresser, MD;  Location: [place] [facility];  Service: Cardiovascular;  Laterality: N/A;   Family History:  Family History  Problem Relation Age of Onset   Atrial fibrillation Mother    Breast cancer Mother    Renal cancer Father    Hypertension Brother    Hyperlipidemia Brother    Other Brother        stent    Renal Disease Maternal Grandmother    Stroke Maternal Grandmother    Heart attack Maternal Grandfather    Cancer Paternal Grandmother    Heart failure Paternal Grandfather    Emphysema Paternal Grandfather    Bronchiolitis Paternal Grandfather     Social History:  Social History   Substance and Sexual Activity  Alcohol Use No     Social History   Substance and Sexual Activity  Drug Use No    Social History   Socioeconomic History   Marital status: Single    Spouse name: Not on file   Number of children: 0   Years of education: Not on file   Highest education level: Not on file  Occupational History   Occupation: retired  Tobacco Use   Smoking status: Never   Smokeless tobacco: Never  Vaping Use   Vaping Use: Never used  Substance and Sexual Activity   Alcohol use: No   Drug use: No   Sexual activity: Not on file  Other  Topics Concern   Not on file  Social History Narrative   Not on file   Social Determinants of Health   Financial Resource Strain: Low Risk    Difficulty of Paying Living Expenses: Not very hard  Food Insecurity: No Food Insecurity   Worried About Running Out of Food in the Last Year: Never true   Ran Out of Food in the Last Year: Never true  Transportation Needs: No Transportation Needs   Lack of Transportation (Medical): No   Lack of Transportation (Non-Medical): No  Physical Activity: Not on file  Stress: Not on file  Social Connections: Not on file   Additional Social History:                         Sleep: Poor  Appetite:  Fair  Current Medications: Current Facility-Administered Medications  Medication  Dose Route Frequency Provider Last Rate Last Admin   acetaminophen (TYLENOL) tablet 650 mg  650 mg Oral Q6H PRN Suella Broad, FNP   650 mg at 01/08/22 0801   alum & mag hydroxide-simeth (MAALOX/MYLANTA) 200-200-20 MG/5ML suspension 30 mL  30 mL Oral Q4H PRN Suella Broad, FNP       amiodarone (PACERONE) tablet 200 mg  200 mg Oral Daily Suella Broad, FNP   200 mg at 01/09/22 0956   atorvastatin (LIPITOR) tablet 20 mg  20 mg Oral QHS Suella Broad, FNP   20 mg at 01/08/22 2202   LORazepam (ATIVAN) tablet 2 mg  2 mg Oral QHS PRN Parks Ranger, DO       magnesium hydroxide (MILK OF MAGNESIA) suspension 30 mL  30 mL Oral Daily PRN Starkes-Perry, Gayland Curry, FNP       magnesium oxide (MAG-OX) tablet 400 mg  400 mg Oral BID Suella Broad, FNP   400 mg at 01/09/22 0956   OLANZapine (ZYPREXA) tablet 15 mg  15 mg Oral QHS Parks Ranger, DO   15 mg at 01/08/22 2202   ondansetron (ZOFRAN) tablet 4 mg  4 mg Oral Q8H PRN He, [name], MD   4 mg at 12/27/21 0848   tacrolimus (PROGRAF) capsule 0.5 mg  0.5 mg Oral [name], FNP   0.5 mg at 01/09/22 0956   tacrolimus (PROGRAF) capsule 1 mg  1 mg Oral [name], FNP   1 mg at 01/08/22 0945   venlafaxine XR (EFFEXOR-XR) 24 hr capsule 112.5 mg  112.5 mg Oral Q breakfast Parks Ranger, DO   112.5 mg at 01/09/22 6063   warfarin (COUMADIN) tablet 1 mg  1 mg Oral Once per day on Sun Tue Wed Thu Fri Sat Parks Ranger, DO   1 mg at 01/08/22 1810   warfarin (COUMADIN) tablet 4 mg  4 mg Oral Q Mon-1800 Suella Broad, FNP   4 mg at 01/04/22 1814   Warfarin - Physician Dosing Inpatient   Does not apply q1600 [name], [facility]   Given at 01/08/22 1639    Lab Results:  Results for orders placed or performed during the hospital encounter of 12/25/21 (from the past 48 hour(s))  Protime-INR     Status: Abnormal   Collection Time: 01/08/22  6:23 AM   Result Value Ref Range   Prothrombin Time 27.7 (H) 11.4 - 15.2 seconds   INR 2.6 (H) 0.8 - 1.2    Comment: (NOTE) INR goal varies based on device and disease  states. Performed at [facility], [address] [place], [place] [address]   CBC with Differential/Platelet     Status: Abnormal   Collection Time: 01/08/22  6:23 AM  Result Value Ref Range   WBC 3.2 (L) 4.0 - 10.5 K/uL   RBC 5.30 4.22 - 5.81 MIL/uL   Hemoglobin 14.5 13.0 - 17.0 g/dL   HCT 44.1 39.0 - 52.0 %   MCV 83.2 80.0 - 100.0 fL   MCH 27.4 26.0 - 34.0 pg   MCHC 32.9 30.0 - 36.0 g/dL   RDW 15.6 (H) 11.5 - 15.5 %   Platelets 92 (L) 150 - 400 K/uL    Comment: Immature Platelet Fraction may be clinically indicated, consider ordering this additional test [id number] PLATELET COUNT CONFIRMED BY SMEAR    nRBC 0.0 0.0 - 0.2 %   Neutrophils Relative % 56 %   Neutro Abs 1.8 1.7 - 7.7 K/uL   Lymphocytes Relative 28 %   Lymphs Abs 0.9 0.7 - 4.0 K/uL   Monocytes Relative 8 %   Monocytes Absolute 0.3 0.1 - 1.0 K/uL   Eosinophils Relative 6 %   Eosinophils Absolute 0.2 0.0 - 0.5 K/uL   Basophils Relative 1 %   Basophils Absolute 0.0 0.0 - 0.1 K/uL   WBC Morphology MORPHOLOGY UNREMARKABLE    RBC Morphology MORPHOLOGY UNREMARKABLE    Smear Review Normal platelet morphology    Immature Granulocytes 1 %   Abs Immature Granulocytes 0.02 0.00 - 0.07 K/uL    Comment: Performed at [facility], [address] [place], [place] [address]  Comprehensive metabolic panel     Status: Abnormal   Collection Time: 01/08/22  6:23 AM  Result Value Ref Range   Sodium 134 (L) 135 - 145 mmol/L   Potassium 4.7 3.5 - 5.1 mmol/L   Chloride 102 98 - 111 mmol/L   CO2 25 22 - 32 mmol/L   Glucose, Bld 97 70 - 99 mg/dL    Comment: Glucose reference range applies only to samples taken after fasting for at least 8 hours.   BUN 24 (H) 8 - 23 mg/dL   Creatinine, Ser 2.98 (H) 0.61 - 1.24 mg/dL   Calcium 9.3 8.9 - 10.3 mg/dL    Total Protein 6.3 (L) 6.5 - 8.1 g/dL   Albumin 3.1 (L) 3.5 - 5.0 g/dL   AST 16 15 - 41 U/L   ALT 11 0 - 44 U/L   Alkaline Phosphatase 66 38 - 126 U/L   Total Bilirubin 1.9 (H) 0.3 - 1.2 mg/dL   GFR, Estimated 22 (L) >60 mL/min    Comment: (NOTE) Calculated using the CKD-EPI Creatinine Equation (2021)    Anion gap 7 5 - 15    Comment: Performed at [facility], [address] [place], [place] [address]  Urinalysis, Complete w Microscopic     Status: Abnormal   Collection Time: 01/08/22 12:56 PM  Result Value Ref Range   Color, Urine YELLOW (A) YELLOW   APPearance CLEAR (A) CLEAR   Specific Gravity, Urine 1.014 1.005 - 1.030   pH 5.0 5.0 - 8.0   Glucose, UA >=500 (A) NEGATIVE mg/dL   Hgb urine dipstick SMALL (A) NEGATIVE   Bilirubin Urine NEGATIVE NEGATIVE   Ketones, ur NEGATIVE NEGATIVE mg/dL   Protein, ur NEGATIVE NEGATIVE mg/dL   Nitrite NEGATIVE NEGATIVE   Leukocytes,Ua NEGATIVE NEGATIVE   RBC / HPF 0-5 0 - 5 RBC/hpf   WBC, UA 0-5 0 - 5  WBC/hpf   Bacteria, UA NONE SEEN NONE SEEN   Squamous Epithelial / LPF 0-5 0 - 5   Mucus PRESENT     Comment: Performed at [facility], [address] [place], [place] [address]  Protein / creatinine ratio, urine     Status: None   Collection Time: 01/08/22 12:56 PM  Result Value Ref Range   Creatinine, Urine 148 mg/dL   Total Protein, Urine 12 mg/dL    Comment: NO NORMAL RANGE ESTABLISHED FOR THIS TEST   Protein Creatinine Ratio 0.08 0.00 - 0.15 mg/mg[Cre]    Comment: Performed at [facility], [address] [place], [place] [address]  Renal function panel     Status: Abnormal   Collection Time: 01/09/22  6:40 AM  Result Value Ref Range   Sodium 135 135 - 145 mmol/L   Potassium 4.6 3.5 - 5.1 mmol/L   Chloride 100 98 - 111 mmol/L   CO2 29 22 - 32 mmol/L   Glucose, Bld 95 70 - 99 mg/dL    Comment: Glucose reference range applies only to samples taken after fasting for at least 8 hours.   BUN 22 8 -  23 mg/dL   Creatinine, Ser 2.88 (H) 0.61 - 1.24 mg/dL   Calcium 9.2 8.9 - 10.3 mg/dL   Phosphorus 3.3 2.5 - 4.6 mg/dL   Albumin 3.0 (L) 3.5 - 5.0 g/dL   GFR, Estimated 23 (L) >60 mL/min    Comment: (NOTE) Calculated using the CKD-EPI Creatinine Equation (2021)    Anion gap 6 5 - 15    Comment: Performed at [facility], [address] [place], [place] [address]    Blood Alcohol level:  Lab Results  Component Value Date   [facility] <10 12/24/2021   ETH <10 16/38/4665    Metabolic Disorder Labs: Lab Results  Component Value Date   HGBA1C 5.2 11/17/2021   MPG 102.54 11/17/2021   MPG 105.41 08/05/2018   No results found for: PROLACTIN Lab Results  Component Value Date   CHOL 117 11/17/2021   TRIG 96 11/17/2021   HDL 28 (L) 11/17/2021   CHOLHDL 4.2 11/17/2021   VLDL 19 11/17/2021   LDLCALC 70 11/17/2021   LDLCALC 87 08/22/2019    Physical Findings: AIMS:  , ,  ,  ,    CIWA:    COWS:     Musculoskeletal: Strength & Muscle Tone: within normal limits Gait & Station: normal Patient leans: N/A  Psychiatric Specialty Exam:  Presentation  General Appearance: No data recorded Eye Contact:No data recorded Speech:No data recorded Speech Volume:No data recorded Handedness:No data recorded  Mood and Affect  Mood:No data recorded Affect:No data recorded  Thought Process  Thought Processes:No data recorded Descriptions of Associations:No data recorded Orientation:No data recorded Thought Content:No data recorded History of Schizophrenia/Schizoaffective disorder:No  Duration of Psychotic Symptoms:No data recorded Hallucinations:No data recorded Ideas of Reference:No data recorded Suicidal Thoughts:No data recorded Homicidal Thoughts:No data recorded  Sensorium  Memory:No data recorded Judgment:No data recorded Insight:No data recorded  Executive Functions  Concentration:No data recorded Attention Span:No data recorded Recall:No data recorded Fund  of Knowledge:No data recorded Language:No data recorded  Psychomotor Activity  Psychomotor Activity:No data recorded  Assets  Assets:No data recorded  Sleep  Sleep:No data recorded   Physical Exam: Physical Exam Vitals and nursing note reviewed.  Constitutional:      Appearance: Normal appearance. He is normal weight.  Neurological:     General: No focal deficit present.  Mental Status: He is alert and oriented to person, place, and time.  Psychiatric:        Attention and Perception: Attention and perception normal.        Mood and Affect: Mood is anxious and depressed.        Speech: Speech normal.        Behavior: Behavior normal. Behavior is cooperative.        Thought Content: Thought content normal.        Cognition and Memory: Cognition and memory normal.        Judgment: Judgment normal.   Review of Systems  Psychiatric/Behavioral:  Positive for depression. The patient is nervous/anxious and has insomnia.   Blood pressure 101/71, pulse 72, temperature 98.2 F (36.8 C), temperature source Oral, resp. rate 18, height '5\' 10"'$  (1.778 m), weight 81.2 kg, SpO2 94 %. Body mass index is 25.69 kg/m.   Treatment Plan Summary: Daily contact with patient to assess and evaluate symptoms and progress in treatment, Medication management, and Plan increase Ativan to 2 mg as needed at bedtime for sleep.  Continue current medications.  [place], DO 01/09/2022, 11:12 AM

## 2022-01-09 NOTE — Plan of Care (Signed)
Hospitalist service was consulted yesterday for AKI with transplanted kidney, which nephrology is now managing.  Pt's other medical issues are at baseline and being managed with home regimen.  Hospitalist service will sign off with agreement from psych attending Dr. Louis Meckel. ?

## 2022-01-09 NOTE — Group Note (Signed)
LCSW Group Therapy Note ? ?Group Date: 01/09/2022 ?Start Time: 1300 ?End Time: 1400 ? ? ?Type of Therapy and Topic:  Group Therapy - Healthy vs Unhealthy Coping Skills ? ?Participation Level:  Did Not Attend  ? ?Description of Group ?The focus of this group was to determine what unhealthy coping techniques typically are used by group members and what healthy coping techniques would be helpful in coping with various problems. Patients were guided in becoming aware of the differences between healthy and unhealthy coping techniques. Patients were asked to identify 2-3 healthy coping skills they would like to learn to use more effectively. ? ?Therapeutic Goals ?Patients learned that coping is what human beings do all day long to deal with various situations in their lives ?Patients defined and discussed healthy vs unhealthy coping techniques ?Patients identified their preferred coping techniques and identified whether these were healthy or unhealthy ?Patients determined 2-3 healthy coping skills they would like to become more familiar with and use more often. ?Patients provided support and ideas to each other ? ? ?Summary of Patient Progress:  Due to limited staffing, group was not held on the unit.  ? ? ?Therapeutic Modalities ?Cognitive Behavioral Therapy ?Motivational Interviewing ? ?Kenna Gilbert [place], LCSWA ?01/09/2022  2:46 PM   ?

## 2022-01-09 NOTE — Plan of Care (Signed)
Patient remain alert and oriented with flat affect and depressed mood. Calm and cooperative during assessment. Patient report sleeping a little. Verbalized anxiety and depression of 5/10. Denies SI, HI and AVH. Denies pain or discomfort. Patient report fair appetite. Ate breakfast among peers and tolerated well. Compliant with all due medications. Remain safe on the unit with Q 15 minute safety check. ? ?Problem: Education: ?Goal: Knowledge of [facility] Education information/materials will improve ?Outcome: Progressing ?Goal: Emotional status will improve ?Outcome: Progressing ?Goal: Mental status will improve ?Outcome: Progressing ?Goal: Verbalization of understanding the information provided will improve ?Outcome: Progressing ?  ?Problem: Activity: ?Goal: Interest or engagement in activities will improve ?Outcome: Progressing ?Goal: Sleeping patterns will improve ?Outcome: Progressing ?  ?Problem: Coping: ?Goal: Ability to verbalize frustrations and anger appropriately will improve ?Outcome: Progressing ?Goal: Ability to demonstrate self-control will improve ?Outcome: Progressing ?  ?Problem: Health Behavior/Discharge Planning: ?Goal: Identification of resources available to assist in meeting health care needs will improve ?Outcome: Progressing ?Goal: Compliance with treatment plan for underlying cause of condition will improve ?Outcome: Progressing ?  ?Problem: Physical Regulation: ?Goal: Ability to maintain clinical measurements within normal limits will improve ?Outcome: Progressing ?  ?Problem: Safety: ?Goal: Periods of time without injury will increase ?Outcome: Progressing ?  ?Problem: Education: ?Goal: Utilization of techniques to improve thought processes will improve ?Outcome: Progressing ?Goal: Knowledge of the prescribed therapeutic regimen will improve ?Outcome: Progressing ?  ?Problem: Activity: ?Goal: Interest or engagement in leisure activities will improve ?Outcome: Progressing ?Goal:  Imbalance in normal sleep/wake cycle will improve ?Outcome: Progressing ?  ?Problem: Coping: ?Goal: Coping ability will improve ?Outcome: Progressing ?Goal: Will verbalize feelings ?Outcome: Progressing ?  ?Problem: Health Behavior/Discharge Planning: ?Goal: Ability to make decisions will improve ?Outcome: Progressing ?Goal: Compliance with therapeutic regimen will improve ?Outcome: Progressing ?  ?Problem: Role Relationship: ?Goal: Will demonstrate positive changes in social behaviors and relationships ?Outcome: Progressing ?  ?Problem: Safety: ?Goal: Ability to disclose and discuss suicidal ideas will improve ?Outcome: Progressing ?Goal: Ability to identify and utilize support systems that promote safety will improve ?Outcome: Progressing ?  ?Problem: Self-Concept: ?Goal: Will verbalize positive feelings about self ?Outcome: Progressing ?Goal: Level of anxiety will decrease ?Outcome: Progressing ?  ?Problem: Education: ?Goal: Ability to make informed decisions regarding treatment will improve ?Outcome: Progressing ?  ?Problem: Coping: ?Goal: Coping ability will improve ?Outcome: Progressing ?  ?Problem: Health Behavior/Discharge Planning: ?Goal: Identification of resources available to assist in meeting health care needs will improve ?Outcome: Progressing ?  ?Problem: Medication: ?Goal: Compliance with prescribed medication regimen will improve ?Outcome: Progressing ?  ?Problem: Self-Concept: ?Goal: Ability to disclose and discuss suicidal ideas will improve ?Outcome: Progressing ?Goal: Will verbalize positive feelings about self ?Outcome: Progressing ?  ?Problem: Education: ?Goal: Ability to state activities that reduce stress will improve ?Outcome: Progressing ?  ?Problem: Coping: ?Goal: Ability to identify and develop effective coping behavior will improve ?Outcome: Progressing ?  ?Problem: Self-Concept: ?Goal: Ability to identify factors that promote anxiety will improve ?Outcome: Progressing ?Goal: Level of  anxiety will decrease ?Outcome: Progressing ?Goal: Ability to modify response to factors that promote anxiety will improve ?Outcome: Progressing ?  ?

## 2022-01-10 DIAGNOSIS — F333 Major depressive disorder, recurrent, severe with psychotic symptoms: Secondary | ICD-10-CM | POA: Diagnosis not present

## 2022-01-10 LAB — PHOSPHORUS: Phosphorus: 2.7 mg/dL (ref 2.5–4.6)

## 2022-01-10 LAB — BASIC METABOLIC PANEL
Anion gap: 8 (ref 5–15)
BUN: 21 mg/dL (ref 8–23)
CO2: 26 mmol/L (ref 22–32)
Calcium: 9.2 mg/dL (ref 8.9–10.3)
Chloride: 98 mmol/L (ref 98–111)
Creatinine, Ser: 2.75 mg/dL — ABNORMAL HIGH (ref 0.61–1.24)
GFR, Estimated: 24 mL/min — ABNORMAL LOW (ref >60)
Glucose, Bld: 129 mg/dL — ABNORMAL HIGH (ref 70–99)
Potassium: 4.3 mmol/L (ref 3.5–5.1)
Sodium: 132 mmol/L — ABNORMAL LOW (ref 135–145)

## 2022-01-10 LAB — CBC
HCT: 42.1 % (ref 39.0–52.0)
Hemoglobin: 13.8 g/dL (ref 13.0–17.0)
MCH: 27.2 pg (ref 26.0–34.0)
MCHC: 32.8 g/dL (ref 30.0–36.0)
MCV: 83 fL (ref 80.0–100.0)
Platelets: 90 10*3/uL — ABNORMAL LOW (ref 150–400)
RBC: 5.07 MIL/uL (ref 4.22–5.81)
RDW: 15.6 % — ABNORMAL HIGH (ref 11.5–15.5)
WBC: 3.3 10*3/uL — ABNORMAL LOW (ref 4.0–10.5)
nRBC: 0 % (ref 0.0–0.2)

## 2022-01-10 NOTE — Progress Notes (Signed)
[name] ? ?MRN: [id number] ? ?DOB/AGE: [birth year] 69 y.o. ? ?Primary Care Physician:Spry, Marsh Dolly., MD ? ?Admit date: 12/25/2021 ? ?Chief Complaint: No chief complaint on file. ? ? ?S-Pt presented on  12/25/2021 with No chief complaint on file. ?. ?Patient seen today in the behavioral unit ?Patient offers no new specific physical complaints ? ?Medications ? ? ? amiodarone  200 mg Oral Daily  ? atorvastatin  20 mg Oral QHS  ? magnesium oxide  400 mg Oral BID  ? OLANZapine  15 mg Oral QHS  ? tacrolimus  0.5 mg Oral QODAY  ? tacrolimus  1 mg Oral QODAY  ? venlafaxine XR  112.5 mg Oral Q breakfast  ? warfarin  1 mg Oral Once per day on Sun Tue Wed Thu Fri Sat  ? warfarin  4 mg Oral Q Mon-1800  ? Warfarin - Physician Dosing Inpatient   Does not apply q1600  ? ? ? ? ? ? ? ?BOF:BPZWC from the symptoms mentioned above,there are no other symptoms referable to all systems reviewed. ? ?Physical Exam: ?Vital signs in last 24 hours: ?Temp:  [97.7 ?F (36.5 ?C)-98 ?F (36.7 ?C)] 97.9 ?F (36.6 ?C) (03/05 0719) ?Pulse Rate:  [62-77] 62 (03/05 0956) ?Resp:  [17-20] 18 (03/05 0719) ?BP: (83-104)/(53-69) 101/69 (03/05 0956) ?SpO2:  [96 %-98 %] 96 % (03/05 0719) ?Weight change:  ?Last BM Date : 01/06/22 ? ?Intake/Output from previous day: ?No intake/output data recorded. ?No intake/output data recorded. ? ? ?Physical Exam: ? ?General- pt is awake,alert, oriented to time place and person ? ?Resp- No acute REsp distress, CTA B/L NO Rhonchi ? ?CVS- S1S2 irregular in rate and rhythm ? ?GIT- BS+, soft, Non tender , Non distended ? ?EXT- No LE Edema,  No Cyanosis ? ?Access- RUE  AVF ? ?Lab Results: ? ?CBC ? ?Recent Labs  ?  01/08/22 ?0623 01/10/22 ?0902  ?WBC 3.2* 3.3*  ?HGB 14.5 13.8  ?HCT 44.1 42.1  ?PLT 92* 90*  ? ? ?BMET ? ?Recent Labs  ?  01/09/22 ?0640 01/10/22 ?0902  ?NA 135 132*  ?K 4.6 4.3  ?CL 100 98  ?CO2 29 26  ?GLUCOSE 95 129*  ?BUN 22 21  ?CREATININE 2.88* 2.75*  ?CALCIUM 9.2 9.2  ? ? ? ? ?Most recent Creatinine trend   ?Lab Results  ?Component Value Date  ? CREATININE 2.75 (H) 01/10/2022  ? CREATININE 2.88 (H) 01/09/2022  ? CREATININE 2.98 (H) 01/08/2022  ?  ? ? ?MICRO ? ? ?No results found for this or any previous visit (from the past 240 hour(s)).  ? ? ? ?Renal ultrasound ?IMPRESSION: ?1. Right lower quadrant transplant kidney, with increased renal ?cortical echotexture consistent with medical renal disease. ?2. Equivocal resistive index within the upper pole, with normal ?resistive index in the lower pole. ?3. Trace perirenal fluid along the upper pole of the transplant ?kidney. ? ? ?Impression: ? ? ?[name] is a 71 y.o. white male with end stage renal disease status post renal transplant for 15 years, status post liver transplant, chronic congestive heart failure, atrial fibrillation, coronary artery disease status post CABG, hyperlipidemia, depression, hypertension, idiopathic thrombocytopenia, who was admitted to [facility] on 12/25/2021 for MDD (major depressive disorder), recurrent episode ([place]) [F33.9] ?  ? ? ?1)Renal   ? ?Acute kidney injury on chronic kidney disease stage IIIB-T: baseline creatinine of 2.14, GFR of 33 on 10/05/21. Followed by Dr. Moshe Cipro, [facility] ?Patient creatinine is improving slowly.it was  3.0, now 2.75 ?  We will continue to hold patient's diuretics and SGLT2 receptor blockers ?Patient's tacrolimus level is pending ? ?2)HTN ? ?Blood pressure is stable  ? ? ?3)Anemia -none ? ?CBC Latest Ref Rng & Units 01/10/2022 01/08/2022 12/27/2021  ?WBC 4.0 - 10.5 K/uL 3.3(L) 3.2(L) 4.9  ?Hemoglobin 13.0 - 17.0 g/dL 13.8 14.5 15.8  ?Hematocrit 39.0 - 52.0 % 42.1 44.1 49.3  ?Platelets 150 - 400 K/uL 90(L) 92(L) 73(L)  ?  ? ? ? HGb at goal (9--11) ? ? ?4) Secondary hyperparathyroidism -CKD Mineral-Bone Disorder ? ? ? ?Lab Results  ?Component Value Date  ? CALCIUM 9.2 01/10/2022  ? CAION 1.27 06/25/2021  ? CAION 1.28 06/25/2021  ? PHOS 2.7 01/10/2022  ? ? ?Secondary Hyperparathyroidism  absent. ? ?Phosphorus at goal. ? ? ?5)Thrombocytopenia ?Patient platelet counts are low but stable ? ? ?6) Electrolytes  ? ?BMP Latest Ref Rng & Units 01/10/2022 01/09/2022 01/08/2022  ?Glucose 70 - 99 mg/dL 129(H) 95 97  ?BUN 8 - 23 mg/dL 21 22 24(H)  ?Creatinine 0.61 - 1.24 mg/dL 2.75(H) 2.88(H) 2.98(H)  ?BUN/Creat Ratio 10 - 24 - - -  ?Sodium 135 - 145 mmol/L 132(L) 135 134(L)  ?Potassium 3.5 - 5.1 mmol/L 4.3 4.6 4.7  ?Chloride 98 - 111 mmol/L 98 100 102  ?CO2 22 - 32 mmol/L '26 29 25  '$ ?Calcium 8.9 - 10.3 mg/dL 9.2 9.2 9.3  ?  ? ?Sodium ?Hyponatremic ?stable ? ?Potassium ?Hyperkalemic ?Now better ? ? ?7)Acid base ? ? ? ?Co2 at goal ? ? ? ? ?Plan: ? ?We will continue patient current treatment plan ?We will continue to hold patient's diuretics ?Platelet counts are low but stable ?Awaiting tacrolimus levels ? ? ? ? ? ?Jem Castro s Theador Hawthorne ?01/10/2022, 1:29 PM  ?

## 2022-01-10 NOTE — Progress Notes (Signed)
Patient denies SI, HI, AVH, or pain.  He spent a good part of the night in the dayroom watch TV.   He is alert and oriented. He presents with a flat affect and depressed mood.  He was medication compliant.  He asked for medication to sleep due to lack of sleep at night.  Patient  remains safe on unit with Q 15 min safety checks.  ?

## 2022-01-10 NOTE — Group Note (Signed)
[facility] LCSW Group Therapy Note ? ? ?Group Date: 01/10/2022 ?Start Time: 1315 ?End Time: 4854 ? ? ?Type of Therapy and Topic: Group Therapy: Avoiding Self-Sabotaging and Enabling Behaviors ? ?Participation Level: Minimal ? ?Mood: Appropriate ? ?Description of Group:  ?In this group, patients will learn how to identify obstacles, self-sabotaging and enabling behaviors, as well as: what are they, why do we do them and what needs these behaviors meet. Discuss unhealthy relationships and how to have positive healthy boundaries with those that sabotage and enable. Explore aspects of self-sabotage and enabling in yourself and how to limit these self-destructive behaviors in everyday life. ? ? ?Therapeutic Goals: ?1. Patient will identify one obstacle that relates to self-sabotage and enabling behaviors ?2. Patient will identify one personal self-sabotaging or enabling behavior they did prior to admission ?3. Patient will state a plan to change the above identified behavior ?4. Patient will demonstrate ability to communicate their needs through discussion and/or role play.  ? ? ?Summary of Patient Progress:  Patient presented on time for group and remained for the duration of the session. Patient presented as reserved; however, participated in the icebreaker activity and shared when prompted. Patient appeared to actively listen when other group members shared. Patient identified that he would like to socialize more, sharing that he has had a lot of losses throughout his life, including significant friendships. Patient expressed continued interest in joining the [facility] after discharging from the hospital, stating that he enjoys being around others.  ? ? ? ? ?Therapeutic Modalities:  ?Cognitive Behavioral Therapy ?Person-Centered Therapy ?Motivational Interviewing ? ? ? ?Kenna Gilbert Carmelina Balducci, LCSWA ?

## 2022-01-10 NOTE — Progress Notes (Signed)
[facility] MD Progress Note  01/10/2022 11:51 AM [name]  MRN:  [id number] Subjective: [name] states that he is feeling better.  His affect is improved.  He is not complaining of being tired today.  He says he is doing all right.  That is a big improvement for him.  Principal Problem: Acute on chronic renal insufficiency Diagnosis: Principal Problem:   Acute on chronic renal insufficiency Active Problems:   Atrial fibrillation (HCC)   Chronic systolic CHF (congestive heart failure) (HCC)   History of liver transplant ([place])   History of kidney transplant   Chronic ITP (idiopathic thrombocytopenic purpura) (HCC)   MDD (major depressive disorder), recurrent episode ([place])  Total Time spent with patient: 15 minutes  Past Psychiatric History: Yes  Past Medical History:  Past Medical History:  Diagnosis Date   Atrial fibrillation ([place]) 08/2009   CAD (coronary artery disease)    s/p CABG -- 1992   Depression    ESRD (end stage renal disease) ([place])    HLD (hyperlipidemia)    HTN (hypertension)    Idiopathic thrombocytopenia (HCC)     Past Surgical History:  Procedure Laterality Date   APPENDECTOMY     CORONARY ARTERY BYPASS GRAFT     FACIAL COSMETIC SURGERY     GALLBLADDER SURGERY     HERNIA REPAIR     KIDNEY TRANSPLANT     LEFT HEART CATH AND CORONARY ANGIOGRAPHY N/A 08/07/2018   Procedure: LEFT HEART CATH AND CORONARY ANGIOGRAPHY;  Surgeon: Sherren Mocha, MD;  Location: [facility];  Service: Cardiovascular;  Laterality: N/A;   LIVER TRANSPLANT     PRESSURE SENSOR/CARDIOMEMS N/A 08/04/2021   Procedure: PRESSURE SENSOR/CARDIOMEMS;  Surgeon: Larey Dresser, MD;  Location: [facility];  Service: Cardiovascular;  Laterality: N/A;   RIGHT HEART CATH N/A 06/25/2021   Procedure: RIGHT HEART CATH;  Surgeon: Larey Dresser, MD;  Location: [facility];  Service: Cardiovascular;  Laterality: N/A;   Family History:  Family History  Problem Relation Age of  Onset   Atrial fibrillation Mother    Breast cancer Mother    Renal cancer Father    Hypertension Brother    Hyperlipidemia Brother    Other Brother        stent    Renal Disease Maternal Grandmother    Stroke Maternal Grandmother    Heart attack Maternal Grandfather    Cancer Paternal Grandmother    Heart failure Paternal Grandfather    Emphysema Paternal Grandfather    Bronchiolitis Paternal Grandfather     Social History:  Social History   Substance and Sexual Activity  Alcohol Use No     Social History   Substance and Sexual Activity  Drug Use No    Social History   Socioeconomic History   Marital status: Single    Spouse name: Not on file   Number of children: 0   Years of education: Not on file   Highest education level: Not on file  Occupational History   Occupation: retired  Tobacco Use   Smoking status: Never   Smokeless tobacco: Never  Vaping Use   Vaping Use: Never used  Substance and Sexual Activity   Alcohol use: No   Drug use: No   Sexual activity: Not on file  Other Topics Concern   Not on file  Social History Narrative   Not on file   Social Determinants of Health   Financial Resource Strain: Low Risk  Difficulty of Paying Living Expenses: Not very hard  Food Insecurity: No Food Insecurity   Worried About Running Out of Food in the Last Year: Never true   Ran Out of Food in the Last Year: Never true  Transportation Needs: No Transportation Needs   Lack of Transportation (Medical): No   Lack of Transportation (Non-Medical): No  Physical Activity: Not on file  Stress: Not on file  Social Connections: Not on file   Additional Social History:                         Sleep: Poor  Appetite:  Fair  Current Medications: Current Facility-Administered Medications  Medication Dose Route Frequency Provider Last Rate Last Admin   acetaminophen (TYLENOL) tablet 650 mg  650 mg Oral Q6H PRN Suella Broad, FNP   650 mg at  01/08/22 0801   alum & mag hydroxide-simeth (MAALOX/MYLANTA) 200-200-20 MG/5ML suspension 30 mL  30 mL Oral Q4H PRN Suella Broad, FNP       amiodarone (PACERONE) tablet 200 mg  200 mg Oral Daily Suella Broad, FNP   200 mg at 01/10/22 0950   atorvastatin (LIPITOR) tablet 20 mg  20 mg Oral QHS Suella Broad, FNP   20 mg at 01/09/22 2202   LORazepam (ATIVAN) tablet 2 mg  2 mg Oral QHS PRN Parks Ranger, DO   2 mg at 01/09/22 2207   magnesium hydroxide (MILK OF MAGNESIA) suspension 30 mL  30 mL Oral Daily PRN Suella Broad, FNP       magnesium oxide (MAG-OX) tablet 400 mg  400 mg Oral BID Suella Broad, FNP   400 mg at 01/10/22 0950   OLANZapine (ZYPREXA) tablet 15 mg  15 mg Oral QHS Parks Ranger, DO   15 mg at 01/09/22 2203   ondansetron (ZOFRAN) tablet 4 mg  4 mg Oral Q8H PRN He, [name], MD   4 mg at 12/27/21 0848   tacrolimus (PROGRAF) capsule 0.5 mg  0.5 mg Oral [name], FNP   0.5 mg at 01/09/22 0956   tacrolimus (PROGRAF) capsule 1 mg  1 mg Oral [name], FNP   1 mg at 01/10/22 0950   venlafaxine XR (EFFEXOR-XR) 24 hr capsule 112.5 mg  112.5 mg Oral Q breakfast Parks Ranger, DO   112.5 mg at 01/10/22 8786   warfarin (COUMADIN) tablet 1 mg  1 mg Oral Once per day on Sun Tue Wed Thu Fri Sat Parks Ranger, DO   1 mg at 01/09/22 7672   warfarin (COUMADIN) tablet 4 mg  4 mg Oral Q Mon-1800 Suella Broad, FNP   4 mg at 01/04/22 1814   Warfarin - Physician Dosing Inpatient   Does not apply q1600 [name], [facility]   Given at 01/09/22 1646    Lab Results:  Results for orders placed or performed during the hospital encounter of 12/25/21 (from the past 48 hour(s))  Urinalysis, Complete w Microscopic     Status: Abnormal   Collection Time: 01/08/22 12:56 PM  Result Value Ref Range   Color, Urine YELLOW (A) YELLOW   APPearance CLEAR (A) CLEAR   Specific Gravity,  Urine 1.014 1.005 - 1.030   pH 5.0 5.0 - 8.0   Glucose, UA >=500 (A) NEGATIVE mg/dL   Hgb urine dipstick SMALL (A) NEGATIVE   Bilirubin Urine NEGATIVE NEGATIVE   Ketones, ur NEGATIVE NEGATIVE mg/dL  Protein, ur NEGATIVE NEGATIVE mg/dL   Nitrite NEGATIVE NEGATIVE   Leukocytes,Ua NEGATIVE NEGATIVE   RBC / HPF 0-5 0 - 5 RBC/hpf   WBC, UA 0-5 0 - 5 WBC/hpf   Bacteria, UA NONE SEEN NONE SEEN   Squamous Epithelial / LPF 0-5 0 - 5   Mucus PRESENT     Comment: Performed at [facility], [address], [place], [place] [address]  Protein / creatinine ratio, urine     Status: None   Collection Time: 01/08/22 12:56 PM  Result Value Ref Range   Creatinine, Urine 148 mg/dL   Total Protein, Urine 12 mg/dL    Comment: NO NORMAL RANGE ESTABLISHED FOR THIS TEST   Protein Creatinine Ratio 0.08 0.00 - 0.15 mg/mg[Cre]    Comment: Performed at [facility], [address] [place], [place] [address]  Renal function panel     Status: Abnormal   Collection Time: 01/09/22  6:40 AM  Result Value Ref Range   Sodium 135 135 - 145 mmol/L   Potassium 4.6 3.5 - 5.1 mmol/L   Chloride 100 98 - 111 mmol/L   CO2 29 22 - 32 mmol/L   Glucose, Bld 95 70 - 99 mg/dL    Comment: Glucose reference range applies only to samples taken after fasting for at least 8 hours.   BUN 22 8 - 23 mg/dL   Creatinine, Ser 2.88 (H) 0.61 - 1.24 mg/dL   Calcium 9.2 8.9 - 10.3 mg/dL   Phosphorus 3.3 2.5 - 4.6 mg/dL   Albumin 3.0 (L) 3.5 - 5.0 g/dL   GFR, Estimated 23 (L) >60 mL/min    Comment: (NOTE) Calculated using the CKD-EPI Creatinine Equation (2021)    Anion gap 6 5 - 15    Comment: Performed at [facility], [address] [place], [place] [address]  Basic metabolic panel     Status: Abnormal   Collection Time: 01/10/22  9:02 AM  Result Value Ref Range   Sodium 132 (L) 135 - 145 mmol/L   Potassium 4.3 3.5 - 5.1 mmol/L   Chloride 98 98 - 111 mmol/L   CO2 26 22 - 32 mmol/L   Glucose, Bld  129 (H) 70 - 99 mg/dL    Comment: Glucose reference range applies only to samples taken after fasting for at least 8 hours.   BUN 21 8 - 23 mg/dL   Creatinine, Ser 2.75 (H) 0.61 - 1.24 mg/dL   Calcium 9.2 8.9 - 10.3 mg/dL   GFR, Estimated 24 (L) >60 mL/min    Comment: (NOTE) Calculated using the CKD-EPI Creatinine Equation (2021)    Anion gap 8 5 - 15    Comment: Performed at [facility], [address] [place], [place] [address]  CBC     Status: Abnormal   Collection Time: 01/10/22  9:02 AM  Result Value Ref Range   WBC 3.3 (L) 4.0 - 10.5 K/uL   RBC 5.07 4.22 - 5.81 MIL/uL   Hemoglobin 13.8 13.0 - 17.0 g/dL   HCT 42.1 39.0 - 52.0 %   MCV 83.0 80.0 - 100.0 fL   MCH 27.2 26.0 - 34.0 pg   MCHC 32.8 30.0 - 36.0 g/dL   RDW 15.6 (H) 11.5 - 15.5 %   Platelets 90 (L) 150 - 400 K/uL    Comment: Immature Platelet Fraction may be clinically indicated, consider ordering this additional test [id number]    nRBC 0.0 0.0 - 0.2 %  Comment: Performed at [facility], [address] [place], [place] [address]  Phosphorus     Status: None   Collection Time: 01/10/22  9:02 AM  Result Value Ref Range   Phosphorus 2.7 2.5 - 4.6 mg/dL    Comment: Performed at [facility], [address] [place], [place] [address]    Blood Alcohol level:  Lab Results  Component Value Date   [facility] <10 12/24/2021   ETH <10 57/84/6962    Metabolic Disorder Labs: Lab Results  Component Value Date   HGBA1C 5.2 11/17/2021   MPG 102.54 11/17/2021   MPG 105.41 08/05/2018   No results found for: PROLACTIN Lab Results  Component Value Date   CHOL 117 11/17/2021   TRIG 96 11/17/2021   HDL 28 (L) 11/17/2021   CHOLHDL 4.2 11/17/2021   VLDL 19 11/17/2021   LDLCALC 70 11/17/2021   LDLCALC 87 08/22/2019    Physical Findings: AIMS:  , ,  ,  ,    CIWA:    COWS:     Musculoskeletal: Strength & Muscle Tone: within normal limits Gait & Station: normal Patient leans:  N/A  Psychiatric Specialty Exam:  Presentation  General Appearance: No data recorded Eye Contact:No data recorded Speech:No data recorded Speech Volume:No data recorded Handedness:No data recorded  Mood and Affect  Mood:No data recorded Affect:No data recorded  Thought Process  Thought Processes:No data recorded Descriptions of Associations:No data recorded Orientation:No data recorded Thought Content:No data recorded History of Schizophrenia/Schizoaffective disorder:No  Duration of Psychotic Symptoms:No data recorded Hallucinations:No data recorded Ideas of Reference:No data recorded Suicidal Thoughts:No data recorded Homicidal Thoughts:No data recorded  Sensorium  Memory:No data recorded Judgment:No data recorded Insight:No data recorded  Executive Functions  Concentration:No data recorded Attention Span:No data recorded Recall:No data recorded Fund of Knowledge:No data recorded Language:No data recorded  Psychomotor Activity  Psychomotor Activity:No data recorded  Assets  Assets:No data recorded  Sleep  Sleep:No data recorded   Physical Exam: Physical Exam Vitals and nursing note reviewed.  Constitutional:      Appearance: Normal appearance. He is normal weight.  Neurological:     General: No focal deficit present.     Mental Status: He is alert and oriented to person, place, and time.  Psychiatric:        Mood and Affect: Mood normal.        Behavior: Behavior normal.   Review of Systems  Constitutional: Negative.   HENT: Negative.    Eyes: Negative.   Respiratory: Negative.    Cardiovascular: Negative.   Gastrointestinal: Negative.   Genitourinary: Negative.   Musculoskeletal: Negative.   Skin: Negative.   Neurological: Negative.   Endo/Heme/Allergies: Negative.   Psychiatric/Behavioral: Negative.    Blood pressure 101/69, pulse 62, temperature 97.9 F (36.6 C), temperature source Oral, resp. rate 18, height '5\' 10"'$  (1.778 m), weight  81.2 kg, SpO2 96 %. Body mass index is 25.69 kg/m.   Treatment Plan Summary: Daily contact with patient to assess and evaluate symptoms and progress in treatment, Medication management, and Plan continue current medications.  Parks Ranger, DO 01/10/2022, 11:51 AM

## 2022-01-10 NOTE — Plan of Care (Signed)
Patient remain alert and oriented X4. Report feeling better this morning. Observed with a brighter mood and affect. Denies anxiety and depression. Denies SI, HI, AVH. Denies pain or discomfort at this time. Ate breakfast in the day room among peers and tolerated well. Remain safe on the unit with Q 15 minute safety check. ? ?Problem: Education: ?Goal: Knowledge of [facility] Education information/materials will improve ?Outcome: Progressing ?Goal: Emotional status will improve ?Outcome: Progressing ?Goal: Mental status will improve ?Outcome: Progressing ?Goal: Verbalization of understanding the information provided will improve ?Outcome: Progressing ?  ?Problem: Activity: ?Goal: Interest or engagement in activities will improve ?Outcome: Progressing ?Goal: Sleeping patterns will improve ?Outcome: Progressing ?  ?Problem: Coping: ?Goal: Ability to verbalize frustrations and anger appropriately will improve ?Outcome: Progressing ?Goal: Ability to demonstrate self-control will improve ?Outcome: Progressing ?  ?Problem: Health Behavior/Discharge Planning: ?Goal: Identification of resources available to assist in meeting health care needs will improve ?Outcome: Progressing ?Goal: Compliance with treatment plan for underlying cause of condition will improve ?Outcome: Progressing ?  ?Problem: Physical Regulation: ?Goal: Ability to maintain clinical measurements within normal limits will improve ?Outcome: Progressing ?  ?Problem: Safety: ?Goal: Periods of time without injury will increase ?Outcome: Progressing ?  ?Problem: Education: ?Goal: Utilization of techniques to improve thought processes will improve ?Outcome: Progressing ?Goal: Knowledge of the prescribed therapeutic regimen will improve ?Outcome: Progressing ?  ?Problem: Activity: ?Goal: Interest or engagement in leisure activities will improve ?Outcome: Progressing ?Goal: Imbalance in normal sleep/wake cycle will improve ?Outcome: Progressing ?  ?Problem:  Coping: ?Goal: Coping ability will improve ?Outcome: Progressing ?Goal: Will verbalize feelings ?Outcome: Progressing ?  ?Problem: Health Behavior/Discharge Planning: ?Goal: Ability to make decisions will improve ?Outcome: Progressing ?Goal: Compliance with therapeutic regimen will improve ?Outcome: Progressing ?  ?Problem: Role Relationship: ?Goal: Will demonstrate positive changes in social behaviors and relationships ?Outcome: Progressing ?  ?Problem: Safety: ?Goal: Ability to disclose and discuss suicidal ideas will improve ?Outcome: Progressing ?Goal: Ability to identify and utilize support systems that promote safety will improve ?Outcome: Progressing ?  ?Problem: Self-Concept: ?Goal: Will verbalize positive feelings about self ?Outcome: Progressing ?Goal: Level of anxiety will decrease ?Outcome: Progressing ?  ?Problem: Education: ?Goal: Ability to make informed decisions regarding treatment will improve ?Outcome: Progressing ?  ?Problem: Coping: ?Goal: Coping ability will improve ?Outcome: Progressing ?  ?Problem: Health Behavior/Discharge Planning: ?Goal: Identification of resources available to assist in meeting health care needs will improve ?Outcome: Progressing ?  ?Problem: Medication: ?Goal: Compliance with prescribed medication regimen will improve ?Outcome: Progressing ?  ?Problem: Self-Concept: ?Goal: Ability to disclose and discuss suicidal ideas will improve ?Outcome: Progressing ?Goal: Will verbalize positive feelings about self ?Outcome: Progressing ?  ?Problem: Education: ?Goal: Ability to state activities that reduce stress will improve ?Outcome: Progressing ?  ?Problem: Coping: ?Goal: Ability to identify and develop effective coping behavior will improve ?Outcome: Progressing ?  ?Problem: Self-Concept: ?Goal: Ability to identify factors that promote anxiety will improve ?Outcome: Progressing ?Goal: Level of anxiety will decrease ?Outcome: Progressing ?Goal: Ability to modify response to  factors that promote anxiety will improve ?Outcome: Progressing ?  ?

## 2022-01-10 NOTE — Plan of Care (Signed)
Patient Calm and Compliant on unit during shift.  Patient denies SI/HI/Avh and contracts for safety. Patient did verbalize 9/10 Anxiety ""every night I worry about going to sleep".  "My medications have helped since I have been here.  Patient participates in therapeutic milieu.  Plan of care will continue.  ? ?Problem: Education: ?Goal: Knowledge of [place] [facility] Education information/materials will improve ?Outcome: Progressing ?Goal: Emotional status will improve ?Outcome: Progressing ?Goal: Mental status will improve ?Outcome: Progressing ?Goal: Verbalization of understanding the information provided will improve ?Outcome: Progressing ?  ?Problem: Activity: ?Goal: Interest or engagement in activities will improve ?Outcome: Progressing ? Patient verbalized "taking deep breaths and blocking out bad thoughts as healthy coping skills. Emotional support and encouragement given to patient.  15 minute safety checks in place.  Encouraged patient to notify nursing staff if any needs arise.   ?

## 2022-01-11 DIAGNOSIS — F333 Major depressive disorder, recurrent, severe with psychotic symptoms: Secondary | ICD-10-CM | POA: Diagnosis not present

## 2022-01-11 LAB — PROTIME-INR
INR: 2 — ABNORMAL HIGH (ref 0.8–1.2)
INR: 2 — AB (ref 0.80–1.20)
Prothrombin Time: 22.5 seconds — ABNORMAL HIGH (ref 11.4–15.2)

## 2022-01-11 LAB — BASIC METABOLIC PANEL
Anion gap: 4 — ABNORMAL LOW (ref 5–15)
BUN: 22 mg/dL (ref 8–23)
CO2: 29 mmol/L (ref 22–32)
Calcium: 9.2 mg/dL (ref 8.9–10.3)
Chloride: 98 mmol/L (ref 98–111)
Creatinine, Ser: 2.6 mg/dL — ABNORMAL HIGH (ref 0.61–1.24)
GFR, Estimated: 26 mL/min — ABNORMAL LOW (ref >60)
Glucose, Bld: 92 mg/dL (ref 70–99)
Potassium: 4.4 mmol/L (ref 3.5–5.1)
Sodium: 131 mmol/L — ABNORMAL LOW (ref 135–145)

## 2022-01-11 LAB — TACROLIMUS LEVEL: Tacrolimus (FK506) - LabCorp: 5.4 ng/mL (ref 2.0–20.0)

## 2022-01-11 MED ORDER — VENLAFAXINE HCL ER 75 MG PO CP24
150.0000 mg | ORAL_CAPSULE | Freq: Every day | ORAL | Status: DC
Start: 2022-01-12 — End: 2022-01-22
  Administered 2022-01-12 – 2022-01-22 (×11): 150 mg via ORAL
  Filled 2022-01-11 (×11): qty 2

## 2022-01-11 MED ORDER — WARFARIN SODIUM 2 MG PO TABS
2.0000 mg | ORAL_TABLET | ORAL | Status: DC
Start: 2022-01-12 — End: 2022-01-22
  Administered 2022-01-12 – 2022-01-21 (×9): 2 mg via ORAL
  Filled 2022-01-11 (×10): qty 1

## 2022-01-11 NOTE — Progress Notes (Signed)
[facility] MD Progress Note  01/11/2022 11:14 AM [name]  MRN:  [id number] Subjective: [name] is slowly improving.  He did sleep last night and states that his mood is getting better.  He is pleasant and cooperative.  He is compliant with his medications.  He denies any side effects from his medicines.  Principal Problem: Acute on chronic renal insufficiency Diagnosis: Principal Problem:   Acute on chronic renal insufficiency Active Problems:   Atrial fibrillation (HCC)   Chronic systolic CHF (congestive heart failure) (HCC)   History of liver transplant ([place])   History of kidney transplant   Chronic ITP (idiopathic thrombocytopenic purpura) (HCC)   MDD (major depressive disorder), recurrent episode ([place])  Total Time spent with patient: 15 minutes  Past Psychiatric History: Yes  Past Medical History:  Past Medical History:  Diagnosis Date   Atrial fibrillation ([place]) 08/2009   CAD (coronary artery disease)    s/p CABG -- 1992   Depression    ESRD (end stage renal disease) ([place])    HLD (hyperlipidemia)    HTN (hypertension)    Idiopathic thrombocytopenia (HCC)     Past Surgical History:  Procedure Laterality Date   APPENDECTOMY     CORONARY ARTERY BYPASS GRAFT     FACIAL COSMETIC SURGERY     GALLBLADDER SURGERY     HERNIA REPAIR     KIDNEY TRANSPLANT     LEFT HEART CATH AND CORONARY ANGIOGRAPHY N/A 08/07/2018   Procedure: LEFT HEART CATH AND CORONARY ANGIOGRAPHY;  Surgeon: Sherren Mocha, MD;  Location: [place] [facility];  Service: Cardiovascular;  Laterality: N/A;   LIVER TRANSPLANT     PRESSURE SENSOR/CARDIOMEMS N/A 08/04/2021   Procedure: PRESSURE SENSOR/CARDIOMEMS;  Surgeon: Larey Dresser, MD;  Location: [facility];  Service: Cardiovascular;  Laterality: N/A;   RIGHT HEART CATH N/A 06/25/2021   Procedure: RIGHT HEART CATH;  Surgeon: Larey Dresser, MD;  Location: [place] [facility];  Service: Cardiovascular;  Laterality: N/A;   Family History:  Family  History  Problem Relation Age of Onset   Atrial fibrillation Mother    Breast cancer Mother    Renal cancer Father    Hypertension Brother    Hyperlipidemia Brother    Other Brother        stent    Renal Disease Maternal Grandmother    Stroke Maternal Grandmother    Heart attack Maternal Grandfather    Cancer Paternal Grandmother    Heart failure Paternal Grandfather    Emphysema Paternal Grandfather    Bronchiolitis Paternal Grandfather     Social History:  Social History   Substance and Sexual Activity  Alcohol Use No     Social History   Substance and Sexual Activity  Drug Use No    Social History   Socioeconomic History   Marital status: Single    Spouse name: Not on file   Number of children: 0   Years of education: Not on file   Highest education level: Not on file  Occupational History   Occupation: retired  Tobacco Use   Smoking status: Never   Smokeless tobacco: Never  Vaping Use   Vaping Use: Never used  Substance and Sexual Activity   Alcohol use: No   Drug use: No   Sexual activity: Not on file  Other Topics Concern   Not on file  Social History Narrative   Not on file   Social Determinants of Health   Financial Resource Strain:  Low Risk    Difficulty of Paying Living Expenses: Not very hard  Food Insecurity: No Food Insecurity   Worried About Running Out of Food in the Last Year: Never true   Ran Out of Food in the Last Year: Never true  Transportation Needs: No Transportation Needs   Lack of Transportation (Medical): No   Lack of Transportation (Non-Medical): No  Physical Activity: Not on file  Stress: Not on file  Social Connections: Not on file   Additional Social History:                         Sleep: Negative  Appetite:  Fair  Current Medications: Current Facility-Administered Medications  Medication Dose Route Frequency Provider Last Rate Last Admin   acetaminophen (TYLENOL) tablet 650 mg  650 mg Oral Q6H PRN  Suella Broad, FNP   650 mg at 01/10/22 1500   alum & mag hydroxide-simeth (MAALOX/MYLANTA) 200-200-20 MG/5ML suspension 30 mL  30 mL Oral Q4H PRN Starkes-Perry, Gayland Curry, FNP       amiodarone (PACERONE) tablet 200 mg  200 mg Oral Daily Suella Broad, FNP   200 mg at 01/11/22 0954   atorvastatin (LIPITOR) tablet 20 mg  20 mg Oral QHS Suella Broad, FNP   20 mg at 01/10/22 2224   LORazepam (ATIVAN) tablet 2 mg  2 mg Oral QHS PRN Parks Ranger, DO   2 mg at 01/10/22 2223   magnesium hydroxide (MILK OF MAGNESIA) suspension 30 mL  30 mL Oral Daily PRN Suella Broad, FNP       magnesium oxide (MAG-OX) tablet 400 mg  400 mg Oral BID Suella Broad, FNP   400 mg at 01/11/22 0953   OLANZapine (ZYPREXA) tablet 15 mg  15 mg Oral QHS Parks Ranger, DO   15 mg at 01/10/22 2224   ondansetron (ZOFRAN) tablet 4 mg  4 mg Oral Q8H PRN He, [name], MD   4 mg at 12/27/21 0848   tacrolimus (PROGRAF) capsule 0.5 mg  0.5 mg Oral [name], FNP   0.5 mg at 01/11/22 0954   tacrolimus (PROGRAF) capsule 1 mg  1 mg Oral [name], FNP   1 mg at 01/10/22 0950   [START ON 01/12/2022] venlafaxine XR (EFFEXOR-XR) 24 hr capsule 150 mg  150 mg Oral Q breakfast Parks Ranger, DO       warfarin (COUMADIN) tablet 1 mg  1 mg Oral Once per day on Sun Tue Wed Thu Fri Sat Parks Ranger, DO   1 mg at 01/10/22 1800   warfarin (COUMADIN) tablet 4 mg  4 mg Oral Q Mon-1800 Suella Broad, FNP   4 mg at 01/04/22 1814   Warfarin - Physician Dosing Inpatient   Does not apply q1600 [name], [facility]   Given at 01/10/22 1627    Lab Results:  Results for orders placed or performed during the hospital encounter of 12/25/21 (from the past 48 hour(s))  Basic metabolic panel     Status: Abnormal   Collection Time: 01/10/22  9:02 AM  Result Value Ref Range   Sodium 132 (L) 135 - 145 mmol/L   Potassium 4.3 3.5 - 5.1 mmol/L    Chloride 98 98 - 111 mmol/L   CO2 26 22 - 32 mmol/L   Glucose, Bld 129 (H) 70 - 99 mg/dL    Comment: Glucose reference range applies only to  samples taken after fasting for at least 8 hours.   BUN 21 8 - 23 mg/dL   Creatinine, Ser 2.75 (H) 0.61 - 1.24 mg/dL   Calcium 9.2 8.9 - 10.3 mg/dL   GFR, Estimated 24 (L) >60 mL/min    Comment: (NOTE) Calculated using the CKD-EPI Creatinine Equation (2021)    Anion gap 8 5 - 15    Comment: Performed at [facility], [address] [place], [place] [address]  CBC     Status: Abnormal   Collection Time: 01/10/22  9:02 AM  Result Value Ref Range   WBC 3.3 (L) 4.0 - 10.5 K/uL   RBC 5.07 4.22 - 5.81 MIL/uL   Hemoglobin 13.8 13.0 - 17.0 g/dL   HCT 42.1 39.0 - 52.0 %   MCV 83.0 80.0 - 100.0 fL   MCH 27.2 26.0 - 34.0 pg   MCHC 32.8 30.0 - 36.0 g/dL   RDW 15.6 (H) 11.5 - 15.5 %   Platelets 90 (L) 150 - 400 K/uL    Comment: Immature Platelet Fraction may be clinically indicated, consider ordering this additional test [id number]    nRBC 0.0 0.0 - 0.2 %    Comment: Performed at [facility], [address] [place], [place] [address]  Phosphorus     Status: None   Collection Time: 01/10/22  9:02 AM  Result Value Ref Range   Phosphorus 2.7 2.5 - 4.6 mg/dL    Comment: Performed at [facility], [address] [place], [place] [address]  Basic metabolic panel     Status: Abnormal   Collection Time: 01/11/22  6:13 AM  Result Value Ref Range   Sodium 131 (L) 135 - 145 mmol/L   Potassium 4.4 3.5 - 5.1 mmol/L   Chloride 98 98 - 111 mmol/L   CO2 29 22 - 32 mmol/L   Glucose, Bld 92 70 - 99 mg/dL    Comment: Glucose reference range applies only to samples taken after fasting for at least 8 hours.   BUN 22 8 - 23 mg/dL   Creatinine, Ser 2.60 (H) 0.61 - 1.24 mg/dL   Calcium 9.2 8.9 - 10.3 mg/dL   GFR, Estimated 26 (L) >60 mL/min    Comment: (NOTE) Calculated using the CKD-EPI Creatinine Equation (2021)    Anion gap  4 (L) 5 - 15    Comment: Performed at [facility], [address] [place], [place] [address]  Protime-INR     Status: Abnormal   Collection Time: 01/11/22  6:13 AM  Result Value Ref Range   Prothrombin Time 22.5 (H) 11.4 - 15.2 seconds   INR 2.0 (H) 0.8 - 1.2    Comment: (NOTE) INR goal varies based on device and disease states. Performed at [facility], [address] [place], [place] [address]     Blood Alcohol level:  Lab Results  Component Value Date   [facility] <10 12/24/2021   ETH <10 49/17/9150    Metabolic Disorder Labs: Lab Results  Component Value Date   HGBA1C 5.2 11/17/2021   MPG 102.54 11/17/2021   MPG 105.41 08/05/2018   No results found for: PROLACTIN Lab Results  Component Value Date   CHOL 117 11/17/2021   TRIG 96 11/17/2021   HDL 28 (L) 11/17/2021   CHOLHDL 4.2 11/17/2021   VLDL 19 11/17/2021   LDLCALC 70 11/17/2021   LDLCALC 87 08/22/2019    Physical Findings: AIMS:  , ,  ,  ,  CIWA:    COWS:     Musculoskeletal: Strength & Muscle Tone: within normal limits Gait & Station: normal Patient leans: N/A  Psychiatric Specialty Exam:  Presentation  General Appearance: No data recorded Eye Contact:No data recorded Speech:No data recorded Speech Volume:No data recorded Handedness:No data recorded  Mood and Affect  Mood:No data recorded Affect:No data recorded  Thought Process  Thought Processes:No data recorded Descriptions of Associations:No data recorded Orientation:No data recorded Thought Content:No data recorded History of Schizophrenia/Schizoaffective disorder:No  Duration of Psychotic Symptoms:No data recorded Hallucinations:No data recorded Ideas of Reference:No data recorded Suicidal Thoughts:No data recorded Homicidal Thoughts:No data recorded  Sensorium  Memory:No data recorded Judgment:No data recorded Insight:No data recorded  Executive Functions  Concentration:No data recorded Attention  Span:No data recorded Recall:No data recorded Fund of Knowledge:No data recorded Language:No data recorded  Psychomotor Activity  Psychomotor Activity:No data recorded  Assets  Assets:No data recorded  Sleep  Sleep:No data recorded   Physical Exam: Physical Exam Vitals and nursing note reviewed.  Constitutional:      Appearance: Normal appearance. He is normal weight.  Neurological:     General: No focal deficit present.     Mental Status: He is alert and oriented to person, place, and time.  Psychiatric:        Attention and Perception: Attention and perception normal.        Mood and Affect: Mood is anxious and depressed. Affect is flat.        Speech: Speech normal.        Behavior: Behavior normal. Behavior is cooperative.        Thought Content: Thought content normal.        Cognition and Memory: Cognition and memory normal.        Judgment: Judgment normal.   Review of Systems  Constitutional: Negative.   HENT: Negative.    Eyes: Negative.   Respiratory: Negative.    Cardiovascular: Negative.   Gastrointestinal: Negative.   Genitourinary: Negative.   Musculoskeletal: Negative.   Skin: Negative.   Neurological: Negative.   Endo/Heme/Allergies: Negative.   Psychiatric/Behavioral:  Positive for depression. The patient is nervous/anxious and has insomnia.   Blood pressure 96/70, pulse 74, temperature (!) 97.4 F (36.3 C), temperature source Oral, resp. rate 18, height '5\' 10"'$  (1.778 m), weight 81.2 kg, SpO2 98 %. Body mass index is 25.69 kg/m.   Treatment Plan Summary: Daily contact with patient to assess and evaluate symptoms and progress in treatment, Medication management, and Plan increase Effexor XR to 150 mg daily.  Parks Ranger, DO 01/11/2022, 11:14 AM

## 2022-01-11 NOTE — Progress Notes (Signed)
[name] ?[place], [place] ?01/11/22 ? ?Subjective:  ? ?LOS: 17 ?No intake/output data recorded. ?Patient is doing fair.  Denies any complaints. ?Stated he does not eat much but able to eat some without nausea or vomiting. ?Still has some lower extremity edema ? ?Objective:  ?Vital signs in last 24 hours:  ?Temp:  [97.5 ?F (36.4 ?C)-97.8 ?F (36.6 ?C)] 97.5 ?F (36.4 ?C) (03/05 1956) ?Pulse Rate:  [61-72] 61 (03/05 1956) ?Resp:  [18] 18 (03/05 1956) ?BP: (87-101)/(58-69) 87/58 (03/05 1956) ?SpO2:  [90 %-95 %] 95 % (03/05 1956) ? ?Weight change:  ?Filed Weights  ? 12/25/21 2145  ?Weight: 81.2 kg  ? ? ?Intake/Output: ?  ?No intake or output data in the 24 hours ending 01/11/22 0758 ? ?Physical Exam: ?General:  No acute distress, laying in the bed  ?HEENT  anicteric, moist oral mucous membrane  ?Pulm/lungs  normal breathing effort, lungs are clear to auscultation  ?CVS/Heart  regular rhythm, no rub or gallop  ?Abdomen:   Soft, nontender  ?Extremities:  + peripheral edema  ?Neurologic:  Alert, oriented, able to follow commands  ?Skin:  No acute rashes  ? ?Basic Metabolic Panel: ? ?Recent Labs  ?Lab 01/08/22 ?3875 01/09/22 ?6433 01/10/22 ?0902 01/11/22 ?2951  ?NA 134* 135 132* 131*  ?K 4.7 4.6 4.3 4.4  ?CL 102 100 98 98  ?CO2 '25 29 26 29  '$ ?GLUCOSE 97 95 129* 92  ?BUN 24* '22 21 22  '$ ?CREATININE 2.98* 2.88* 2.75* 2.60*  ?CALCIUM 9.3 9.2 9.2 9.2  ?PHOS  --  3.3 2.7  --   ? ? ? ?CBC: ?Recent Labs  ?Lab 01/08/22 ?8841 01/10/22 ?0902  ?WBC 3.2* 3.3*  ?NEUTROABS 1.8  --   ?HGB 14.5 13.8  ?HCT 44.1 42.1  ?MCV 83.2 83.0  ?PLT 92* 90*  ? ? ? No results found for: HEPBSAG, HEPBSAB, HEPBIGM ? ? ? ?Microbiology: ? ?No results found for this or any previous visit (from the past 240 hour(s)). ? ?Coagulation Studies: ?Recent Labs  ?  01/11/22 ?0613  ?LABPROT 22.5*  ?INR 2.0*  ? ? ?Urinalysis: ?Recent Labs  ?  01/08/22 ?1256  ?COLORURINE YELLOW*  ?LABSPEC 1.014  ?PHURINE 5.0  ?GLUCOSEU >=500*  ?HGBUR SMALL*   ?BILIRUBINUR NEGATIVE  ?KETONESUR NEGATIVE  ?PROTEINUR NEGATIVE  ?NITRITE NEGATIVE  ?LEUKOCYTESUR NEGATIVE  ?  ? ? ?Imaging: ?No results found. ? ? ?Medications:  ? ? ? amiodarone  200 mg Oral Daily  ? atorvastatin  20 mg Oral QHS  ? magnesium oxide  400 mg Oral BID  ? OLANZapine  15 mg Oral QHS  ? tacrolimus  0.5 mg Oral QODAY  ? tacrolimus  1 mg Oral QODAY  ? venlafaxine XR  112.5 mg Oral Q breakfast  ? warfarin  1 mg Oral Once per day on Sun Tue Wed Thu Fri Sat  ? warfarin  4 mg Oral Q Mon-1800  ? Warfarin - Physician Dosing Inpatient   Does not apply q1600  ? ?acetaminophen, alum & mag hydroxide-simeth, LORazepam, magnesium hydroxide, ondansetron ? ?Assessment/ Plan:  ?71 y.o. 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 ?Principal Problem: ?  Acute on chronic renal insufficiency ?Active Problems: ?  Atrial fibrillation ([place]) ?  Chronic systolic CHF (congestive heart failure) ([place]) ?  History of liver transplant ([place]) ?  History of kidney transplant ?  Chronic ITP (idiopathic thrombocytopenic purpura) (HCC) ?  MDD (major depressive disorder), recurrent episode ([place]) ? ? ?#.  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  01/08/22 ?0623 01/09/22 ?0640 01/10/22 ?0902 01/11/22 ?4098  ?CREATININE 2.98* 2.88* 2.75* 2.60*  ?Outpatient transplant medication outpatient transplant immunosuppression regimen includes tacrolimus 0.5 mg twice daily alternating with 1 mg daily and 5 mg of prednisone daily.  Was requiring midodrine 5 mg 3 times daily for hypotension. ?Goal  for Tacrolimus trough 2-4 ?Level pending from 3/5 ? ? ?#. Anemia assessment ? ?Lab Results  ?Component Value Date  ? HGB 13.8 01/10/2022  ?Hemoglobin in the normal range ? ?#.  Bone and mineral metabolism assessment ? ?No results found for: PTH ?Lab Results  ?Component Value Date  ? PHOS 2.7 01/10/2022  ?Phosphorus in normal range ? ? ? ? ? LOS: 17 ?[name] ?3/6/20237:58 AM ? ?[facility] ?[place], [place] ?[phone]  ?

## 2022-01-11 NOTE — Progress Notes (Signed)
Recreation Therapy Notes ? ?Date: 01/11/2022  ?  ?Time: 1:00pm  ? ?Location: Court yard   ?  ?Behavioral response: Appropriate ?  ?Intervention Topic:  Leisure   ?  ?Discussion/Intervention:  ?Group content today was focused on leisure. The group defined what leisure is and some positive leisure activities they participate in. Individuals identified the difference between good and bad leisure. Participants expressed how they feel after participating in the leisure of their choice. The group discussed how they go about picking a leisure activity and if others are involved in their leisure activities. The patient stated how many leisure activities they have to choose from and reasons why it is important to have leisure time. Individuals participated in the intervention ?Exploration of Leisure? where they had a chance to identify new leisure activities as well as benefits of leisure. ?  ?Clinical Observations/Feedback: ?Patient came to group and was able to focus on group topic and task at hand. Individual was social with peers and staff while participating in the intervention.   ?Mackenize Delgadillo LRT/CTRS  ?  ? ? ? ? ? ? ? ?[name] ?01/11/2022 2:37 PM ?

## 2022-01-11 NOTE — Progress Notes (Signed)
Patient is alert and oriented with a flat affect and depressed mood. Patient is calm and cooperative. Patient reports sleeping some last night. Patient endorses depression a 5/10 and anxiety a 7/10. Patient related his anxiety to having a bad dream last night. When asked what the dream was about , patient would not talk about it. Patient denies SI, HI, AVH and also denies pain at this time. Patient ate breakfast with fair appetite in the day room with peers and staff. AM scheduled meds administered as ordered. Remains safe on the unit with Q 15 minute safety check. ?

## 2022-01-12 DIAGNOSIS — F333 Major depressive disorder, recurrent, severe with psychotic symptoms: Secondary | ICD-10-CM | POA: Diagnosis not present

## 2022-01-12 LAB — TACROLIMUS LEVEL: Tacrolimus (FK506) - LabCorp: 5.3 ng/mL (ref 2.0–20.0)

## 2022-01-12 MED ORDER — MELATONIN 5 MG PO TABS
10.0000 mg | ORAL_TABLET | Freq: Every day | ORAL | Status: DC
  Administered 2022-01-12 – 2022-01-21 (×10): 10 mg via ORAL
  Filled 2022-01-12 (×10): qty 2

## 2022-01-12 NOTE — Plan of Care (Signed)
Patient is calm and cooperative. Patient still verbalizes not sleeping as well as he would want to. Patient denies depression and anxiety this morning. Patient denies SI, HI, AVH and also denies pain at this time. Patient ate breakfast with fair appetite in the day room with peers and staff. AM scheduled meds administered as ordered. Remains safe on the unit with Q 15 minute safety check. ? ?Problem: Education: ?Goal: Knowledge of [facility] Education information/materials will improve ?Outcome: Progressing ?Goal: Emotional status will improve ?Outcome: Progressing ?Goal: Mental status will improve ?Outcome: Progressing ?Goal: Verbalization of understanding the information provided will improve ?Outcome: Progressing ?  ?Problem: Activity: ?Goal: Interest or engagement in activities will improve ?Outcome: Progressing ?Goal: Sleeping patterns will improve ?Outcome: Progressing ?  ?Problem: Coping: ?Goal: Ability to verbalize frustrations and anger appropriately will improve ?Outcome: Progressing ?Goal: Ability to demonstrate self-control will improve ?Outcome: Progressing ?  ?Problem: Health Behavior/Discharge Planning: ?Goal: Identification of resources available to assist in meeting health care needs will improve ?Outcome: Progressing ?Goal: Compliance with treatment plan for underlying cause of condition will improve ?Outcome: Progressing ?  ?Problem: Physical Regulation: ?Goal: Ability to maintain clinical measurements within normal limits will improve ?Outcome: Progressing ?  ?Problem: Safety: ?Goal: Periods of time without injury will increase ?Outcome: Progressing ?  ?Problem: Education: ?Goal: Utilization of techniques to improve thought processes will improve ?Outcome: Progressing ?Goal: Knowledge of the prescribed therapeutic regimen will improve ?Outcome: Progressing ?  ?Problem: Activity: ?Goal: Interest or engagement in leisure activities will improve ?Outcome: Progressing ?Goal: Imbalance in  normal sleep/wake cycle will improve ?Outcome: Progressing ?  ?Problem: Coping: ?Goal: Coping ability will improve ?Outcome: Progressing ?Goal: Will verbalize feelings ?Outcome: Progressing ?  ?Problem: Health Behavior/Discharge Planning: ?Goal: Ability to make decisions will improve ?Outcome: Progressing ?Goal: Compliance with therapeutic regimen will improve ?Outcome: Progressing ?  ?Problem: Role Relationship: ?Goal: Will demonstrate positive changes in social behaviors and relationships ?Outcome: Progressing ?  ?Problem: Safety: ?Goal: Ability to disclose and discuss suicidal ideas will improve ?Outcome: Progressing ?Goal: Ability to identify and utilize support systems that promote safety will improve ?Outcome: Progressing ?  ?Problem: Self-Concept: ?Goal: Will verbalize positive feelings about self ?Outcome: Progressing ?Goal: Level of anxiety will decrease ?Outcome: Progressing ?  ?Problem: Education: ?Goal: Ability to make informed decisions regarding treatment will improve ?Outcome: Progressing ?  ?Problem: Coping: ?Goal: Coping ability will improve ?Outcome: Progressing ?  ?Problem: Health Behavior/Discharge Planning: ?Goal: Identification of resources available to assist in meeting health care needs will improve ?Outcome: Progressing ?  ?Problem: Medication: ?Goal: Compliance with prescribed medication regimen will improve ?Outcome: Progressing ?  ?Problem: Self-Concept: ?Goal: Ability to disclose and discuss suicidal ideas will improve ?Outcome: Progressing ?Goal: Will verbalize positive feelings about self ?Outcome: Progressing ?  ?Problem: Education: ?Goal: Ability to state activities that reduce stress will improve ?Outcome: Progressing ?  ?Problem: Coping: ?Goal: Ability to identify and develop effective coping behavior will improve ?Outcome: Progressing ?  ?Problem: Self-Concept: ?Goal: Ability to identify factors that promote anxiety will improve ?Outcome: Progressing ?Goal: Level of anxiety will  decrease ?Outcome: Progressing ?Goal: Ability to modify response to factors that promote anxiety will improve ?Outcome: Progressing ?  ?

## 2022-01-12 NOTE — BH IP Treatment Plan (Signed)
Interdisciplinary Treatment and Diagnostic Plan Update  01/12/2022 Time of Session: 9:45AM [name] MRN: [id number]  Principal Diagnosis: Acute on chronic renal insufficiency  Secondary Diagnoses: Principal Problem:   Acute on chronic renal insufficiency Active Problems:   Atrial fibrillation (HCC)   Chronic systolic CHF (congestive heart failure) ([place])   History of liver transplant ([place])   History of kidney transplant   Chronic ITP (idiopathic thrombocytopenic purpura) (HCC)   MDD (major depressive disorder), recurrent episode (HCC)   Current Medications:  Current Facility-Administered Medications  Medication Dose Route Frequency Provider Last Rate Last Admin   acetaminophen (TYLENOL) tablet 650 mg  650 mg Oral Q6H PRN Suella Broad, FNP   650 mg at 01/11/22 1705   alum & mag hydroxide-simeth (MAALOX/MYLANTA) 200-200-20 MG/5ML suspension 30 mL  30 mL Oral Q4H PRN Starkes-Perry, Gayland Curry, FNP       amiodarone (PACERONE) tablet 200 mg  200 mg Oral Daily Suella Broad, FNP   200 mg at 01/12/22 0920   atorvastatin (LIPITOR) tablet 20 mg  20 mg Oral QHS Suella Broad, FNP   20 mg at 01/11/22 2214   LORazepam (ATIVAN) tablet 2 mg  2 mg Oral QHS PRN Parks Ranger, DO   2 mg at 01/11/22 2223   magnesium hydroxide (MILK OF MAGNESIA) suspension 30 mL  30 mL Oral Daily PRN Suella Broad, FNP       magnesium oxide (MAG-OX) tablet 400 mg  400 mg Oral BID Suella Broad, FNP   400 mg at 01/12/22 0920   OLANZapine (ZYPREXA) tablet 15 mg  15 mg Oral QHS Parks Ranger, DO   15 mg at 01/11/22 2215   ondansetron (ZOFRAN) tablet 4 mg  4 mg Oral Q8H PRN He, [name], MD   4 mg at 12/27/21 0848   tacrolimus (PROGRAF) capsule 0.5 mg  0.5 mg Oral [name], FNP   0.5 mg at 01/11/22 0954   tacrolimus (PROGRAF) capsule 1 mg  1 mg Oral [name], FNP   1 mg at 01/12/22 0920   venlafaxine XR (EFFEXOR-XR)  24 hr capsule 150 mg  150 mg Oral Q breakfast Parks Ranger, DO   150 mg at 01/12/22 6759   warfarin (COUMADIN) tablet 2 mg  2 mg Oral Once per day on Sun Tue Wed Thu Fri Sat Parks Ranger, DO       warfarin (COUMADIN) tablet 4 mg  4 mg Oral Q Mon-1800 Suella Broad, FNP   4 mg at 01/11/22 1705   Warfarin - Physician Dosing Inpatient   Does not apply q1600 [name], [facility]   Given at 01/11/22 1706   PTA Medications: Medications Prior to Admission  Medication Sig Dispense Refill Last Dose   allopurinol (ZYLOPRIM) 100 MG tablet Take 100 mg by mouth daily.      amiodarone (PACERONE) 200 MG tablet Take 1 tablet (200 mg total) by mouth daily. 30 tablet 5    ARIPiprazole (ABILIFY) 5 MG tablet Take 1 tablet (5 mg total) by mouth at bedtime. 30 tablet 2    atorvastatin (LIPITOR) 40 MG tablet Take 1 tablet (40 mg total) by mouth at bedtime. (Patient taking differently: Take 20 mg by mouth at bedtime.) 90 tablet 3    calcitRIOL (ROCALTROL) 0.25 MCG capsule Take 0.25 mcg by mouth daily.      Cholecalciferol (VITAMIN D3) 50 MCG (2000 UT) TABS Take 2,000 Units by mouth daily.  clonazePAM (KLONOPIN) 1 MG tablet Take 1 tablet (1 mg total) by mouth at bedtime. 30 tablet 2    Cyanocobalamin (VITAMIN B12) 1000 MCG TBCR Take 1,000 mcg by mouth daily.      dapagliflozin propanediol (FARXIGA) 10 MG TABS tablet Take 1 tablet (10 mg total) by mouth daily. 30 tablet 6    EPINEPHrine (EPI-PEN) 0.3 mg/0.3 mL DEVI Inject 0.3 mg into the muscle once as needed (severe allergic reaction). (Patient not taking: Reported on 12/24/2021)      ferrous sulfate 325 (65 FE) MG tablet Take 325 mg by mouth daily with breakfast.      fish oil-omega-3 fatty acids 1000 MG capsule Take 1 g by mouth 2 (two) times daily.      MAGNESIUM-OXIDE 400 (240 Mg) MG tablet Take 400 mg by mouth 2 (two) times daily.      mirtazapine (REMERON) 15 MG tablet Take 1 tablet (15 mg total) by mouth at bedtime. 30 tablet 2     nitroGLYCERIN (NITROSTAT) 0.4 MG SL tablet Place 0.4 mg under the tongue every 5 (five) minutes as needed for chest pain. (Patient not taking: Reported on 12/24/2021)      potassium chloride SA (KLOR-CON) 20 MEQ tablet Take 2 tablets (40 mEq total) by mouth 3 (three) times daily. 90 tablet 6    tacrolimus (PROGRAF) 0.5 MG capsule Take 0.5 mg by mouth See admin instructions. Takes 0.5 mg  tablet by mouth one day and 1 mg tablets next day (1 and 2 tablets on alternate days)      torsemide (DEMADEX) 20 MG tablet Take 5 tablets (100 mg total) by mouth daily. 210 tablet 6    traZODone (DESYREL) 100 MG tablet Take 1 tablet (100 mg total) by mouth at bedtime as needed for sleep. 30 tablet 2    warfarin (COUMADIN) 4 MG tablet TAKE AS DIRECTED BY COUMADIN CLINIC (Patient taking differently: Take 2-4 mg by mouth See admin instructions. Take '4mg'$  by mouth on Monday, then take '2mg'$  (1/2 tablet) all other days) 65 tablet 1     Patient Stressors: Health problems   Other: loneliness    Patient Strengths: Ability for insight  Average or above average intelligence   Treatment Modalities: Medication Management, Group therapy, Case management,  1 to 1 session with clinician, Psychoeducation, Recreational therapy.   Physician Treatment Plan for Primary Diagnosis: Acute on chronic renal insufficiency Long Term Goal(s): Improvement in symptoms so as ready for discharge   Short Term Goals: Ability to identify changes in lifestyle to reduce recurrence of condition will improve Ability to verbalize feelings will improve Ability to disclose and discuss suicidal ideas Ability to demonstrate self-control will improve Ability to identify and develop effective coping behaviors will improve Ability to maintain clinical measurements within normal limits will improve Compliance with prescribed medications will improve Ability to identify triggers associated with substance abuse/mental health issues will  improve  Medication Management: Evaluate patient's response, side effects, and tolerance of medication regimen.  Therapeutic Interventions: 1 to 1 sessions, Unit Group sessions and Medication administration.  Evaluation of Outcomes: Progressing  Physician Treatment Plan for Secondary Diagnosis: Principal Problem:   Acute on chronic renal insufficiency Active Problems:   Atrial fibrillation (HCC)   Chronic systolic CHF (congestive heart failure) (HCC)   History of liver transplant ([place])   History of kidney transplant   Chronic ITP (idiopathic thrombocytopenic purpura) (HCC)   MDD (major depressive disorder), recurrent episode ([place])  Long Term Goal(s): Improvement in symptoms so  as ready for discharge   Short Term Goals: Ability to identify changes in lifestyle to reduce recurrence of condition will improve Ability to verbalize feelings will improve Ability to disclose and discuss suicidal ideas Ability to demonstrate self-control will improve Ability to identify and develop effective coping behaviors will improve Ability to maintain clinical measurements within normal limits will improve Compliance with prescribed medications will improve Ability to identify triggers associated with substance abuse/mental health issues will improve     Medication Management: Evaluate patient's response, side effects, and tolerance of medication regimen.  Therapeutic Interventions: 1 to 1 sessions, Unit Group sessions and Medication administration.  Evaluation of Outcomes: Progressing   RN Treatment Plan for Primary Diagnosis: Acute on chronic renal insufficiency Long Term Goal(s): Knowledge of disease and therapeutic regimen to maintain health will improve  Short Term Goals: Ability to remain free from injury will improve, Ability to verbalize frustration and anger appropriately will improve, Ability to demonstrate self-control, Ability to verbalize feelings will improve, Ability to identify and  develop effective coping behaviors will improve, and Compliance with prescribed medications will improve  Medication Management: RN will administer medications as ordered by provider, will assess and evaluate patient's response and provide education to patient for prescribed medication. RN will report any adverse and/or side effects to prescribing provider.  Therapeutic Interventions: 1 on 1 counseling sessions, Psychoeducation, Medication administration, Evaluate responses to treatment, Monitor vital signs and CBGs as ordered, Perform/monitor CIWA, COWS, AIMS and Fall Risk screenings as ordered, Perform wound care treatments as ordered.  Evaluation of Outcomes: Progressing   LCSW Treatment Plan for Primary Diagnosis: Acute on chronic renal insufficiency Long Term Goal(s): Safe transition to appropriate next level of care at discharge, Engage patient in therapeutic group addressing interpersonal concerns.  Short Term Goals: Engage patient in aftercare planning with referrals and resources, Increase social support, Increase ability to appropriately verbalize feelings, Increase emotional regulation, Facilitate acceptance of mental health diagnosis and concerns, Identify triggers associated with mental health/substance abuse issues, and Increase skills for wellness and recovery  Therapeutic Interventions: Assess for all discharge needs, 1 to 1 time with Social worker, Explore available resources and support systems, Assess for adequacy in community support network, Educate family and significant other(s) on suicide prevention, Complete Psychosocial Assessment, Interpersonal group therapy.  Evaluation of Outcomes: Progressing   Progress in Treatment: Attending groups: Yes. Participating in groups: Yes. Taking medication as prescribed: Yes. Toleration medication: Yes. Family/Significant other contact made: Yes, individual(s) contacted:  SPE completed with Kathlen Mody , sister in law Patient  understands diagnosis: Yes. Discussing patient identified problems/goals with staff: Yes. Medical problems stabilized or resolved: Yes. Denies suicidal/homicidal ideation: Yes. Issues/concerns per patient self-inventory: No. Other: None  New problem(s) identified: No, Describe:  None  New Short Term/Long Term Goal(s): Patient to work towards medication management for mood stabilization; development of comprehensive mental wellness plan. Update 01/07/22: No changes at this time. Update 01/12/22: No changes at this time.    Patient Goals: Patient stated that he wants more sleep and work to work on his depression and eliminating his SI thoughts.No additional goals identified at this time. Update 01/07/22: No changes at this time. Update 01/12/22: No changes at this time.    Discharge Plan or Barriers: No psychosocial barriers identified at this time, patient to return to place of residence when appropriate for discharge. CSW will assist pt in development of an appropriate discharge/aftercare plan. Update 01/07/22: No changes at this time. Update 01/12/22: No changes at this time.  Reason for Continuation of Hospitalization: Depression, Other: Insomnia   Estimated Length of Stay: TBD   Scribe for Treatment Team: Makaylah Oddo A [place], Latanya Presser 01/12/2022 9:55 AM

## 2022-01-12 NOTE — Progress Notes (Signed)
[facility] MD Progress Note  01/12/2022 1:22 PM [name]  MRN:  [id number] Subjective: [name]'s mood is improving but he still complains of not sleeping.  His affect has improved.  He is tolerating the medications and denies any side effects.  He has been eating better.  He is participating in groups.  We talked about his medications and how he has been on numerous medicines for sleep and I told her we could add melatonin but that when he goes home he will have a sleep study done.  Unfortunately, we do not do it here.  Principal Problem: Acute on chronic renal insufficiency Diagnosis: Principal Problem:   Acute on chronic renal insufficiency Active Problems:   Atrial fibrillation (HCC)   Chronic systolic CHF (congestive heart failure) (HCC)   History of liver transplant ([place])   History of kidney transplant   Chronic ITP (idiopathic thrombocytopenic purpura) (HCC)   MDD (major depressive disorder), recurrent episode ([place])  Total Time spent with patient: 15 minutes  Past Psychiatric History: Yes  Past Medical History:  Past Medical History:  Diagnosis Date   Atrial fibrillation ([place]) 08/2009   CAD (coronary artery disease)    s/p CABG -- 1992   Depression    ESRD (end stage renal disease) ([place])    HLD (hyperlipidemia)    HTN (hypertension)    Idiopathic thrombocytopenia (HCC)     Past Surgical History:  Procedure Laterality Date   APPENDECTOMY     CORONARY ARTERY BYPASS GRAFT     FACIAL COSMETIC SURGERY     GALLBLADDER SURGERY     HERNIA REPAIR     KIDNEY TRANSPLANT     LEFT HEART CATH AND CORONARY ANGIOGRAPHY N/A 08/07/2018   Procedure: LEFT HEART CATH AND CORONARY ANGIOGRAPHY;  Surgeon: Sherren Mocha, MD;  Location: [place] [facility];  Service: Cardiovascular;  Laterality: N/A;   LIVER TRANSPLANT     PRESSURE SENSOR/CARDIOMEMS N/A 08/04/2021   Procedure: PRESSURE SENSOR/CARDIOMEMS;  Surgeon: Larey Dresser, MD;  Location: [place] [facility];  Service: Cardiovascular;   Laterality: N/A;   RIGHT HEART CATH N/A 06/25/2021   Procedure: RIGHT HEART CATH;  Surgeon: Larey Dresser, MD;  Location: [place] [facility];  Service: Cardiovascular;  Laterality: N/A;   Family History:  Family History  Problem Relation Age of Onset   Atrial fibrillation Mother    Breast cancer Mother    Renal cancer Father    Hypertension Brother    Hyperlipidemia Brother    Other Brother        stent    Renal Disease Maternal Grandmother    Stroke Maternal Grandmother    Heart attack Maternal Grandfather    Cancer Paternal Grandmother    Heart failure Paternal Grandfather    Emphysema Paternal Grandfather    Bronchiolitis Paternal Grandfather     Social History:  Social History   Substance and Sexual Activity  Alcohol Use No     Social History   Substance and Sexual Activity  Drug Use No    Social History   Socioeconomic History   Marital status: Single    Spouse name: Not on file   Number of children: 0   Years of education: Not on file   Highest education level: Not on file  Occupational History   Occupation: retired  Tobacco Use   Smoking status: Never   Smokeless tobacco: Never  Vaping Use   Vaping Use: Never used  Substance and Sexual Activity  Alcohol use: No   Drug use: No   Sexual activity: Not on file  Other Topics Concern   Not on file  Social History Narrative   Not on file   Social Determinants of Health   Financial Resource Strain: Low Risk    Difficulty of Paying Living Expenses: Not very hard  Food Insecurity: No Food Insecurity   Worried About Running Out of Food in the Last Year: Never true   Ran Out of Food in the Last Year: Never true  Transportation Needs: No Transportation Needs   Lack of Transportation (Medical): No   Lack of Transportation (Non-Medical): No  Physical Activity: Not on file  Stress: Not on file  Social Connections: Not on file   Additional Social History:                         Sleep:  Poor  Appetite:  Fair  Current Medications: Current Facility-Administered Medications  Medication Dose Route Frequency Provider Last Rate Last Admin   acetaminophen (TYLENOL) tablet 650 mg  650 mg Oral Q6H PRN Suella Broad, FNP   650 mg at 01/11/22 1705   alum & mag hydroxide-simeth (MAALOX/MYLANTA) 200-200-20 MG/5ML suspension 30 mL  30 mL Oral Q4H PRN Starkes-Perry, Gayland Curry, FNP       amiodarone (PACERONE) tablet 200 mg  200 mg Oral Daily Suella Broad, FNP   200 mg at 01/12/22 0920   atorvastatin (LIPITOR) tablet 20 mg  20 mg Oral QHS Suella Broad, FNP   20 mg at 01/11/22 2214   LORazepam (ATIVAN) tablet 2 mg  2 mg Oral QHS PRN Parks Ranger, DO   2 mg at 01/11/22 2223   magnesium hydroxide (MILK OF MAGNESIA) suspension 30 mL  30 mL Oral Daily PRN Suella Broad, FNP       magnesium oxide (MAG-OX) tablet 400 mg  400 mg Oral BID Suella Broad, FNP   400 mg at 01/12/22 0920   melatonin tablet 10 mg  10 mg Oral QHS Parks Ranger, DO       OLANZapine [facility]) tablet 15 mg  15 mg Oral QHS Parks Ranger, DO   15 mg at 01/11/22 2215   ondansetron (ZOFRAN) tablet 4 mg  4 mg Oral Q8H PRN He, [name], MD   4 mg at 12/27/21 0848   tacrolimus (PROGRAF) capsule 0.5 mg  0.5 mg Oral [name], FNP   0.5 mg at 01/11/22 0954   tacrolimus (PROGRAF) capsule 1 mg  1 mg Oral [name], FNP   1 mg at 01/12/22 0920   venlafaxine XR (EFFEXOR-XR) 24 hr capsule 150 mg  150 mg Oral Q breakfast Parks Ranger, DO   150 mg at 01/12/22 3614   warfarin (COUMADIN) tablet 2 mg  2 mg Oral Once per day on Sun Tue Wed Thu Fri Sat Parks Ranger, DO       warfarin (COUMADIN) tablet 4 mg  4 mg Oral Q Mon-1800 Suella Broad, FNP   4 mg at 01/11/22 1705   Warfarin - Physician Dosing Inpatient   Does not apply q1600 [name], [facility]   Given at 01/11/22 1706    Lab Results:  Results for  orders placed or performed during the hospital encounter of 12/25/21 (from the past 48 hour(s))  Basic metabolic panel     Status: Abnormal   Collection Time: 01/11/22  6:13 AM  Result Value Ref Range   Sodium 131 (L) 135 - 145 mmol/L   Potassium 4.4 3.5 - 5.1 mmol/L   Chloride 98 98 - 111 mmol/L   CO2 29 22 - 32 mmol/L   Glucose, Bld 92 70 - 99 mg/dL    Comment: Glucose reference range applies only to samples taken after fasting for at least 8 hours.   BUN 22 8 - 23 mg/dL   Creatinine, Ser 2.60 (H) 0.61 - 1.24 mg/dL   Calcium 9.2 8.9 - 10.3 mg/dL   GFR, Estimated 26 (L) >60 mL/min    Comment: (NOTE) Calculated using the CKD-EPI Creatinine Equation (2021)    Anion gap 4 (L) 5 - 15    Comment: Performed at [facility], [address] [place], [place] [address]  Protime-INR     Status: Abnormal   Collection Time: 01/11/22  6:13 AM  Result Value Ref Range   Prothrombin Time 22.5 (H) 11.4 - 15.2 seconds   INR 2.0 (H) 0.8 - 1.2    Comment: (NOTE) INR goal varies based on device and disease states. Performed at [facility], [address] [place], [place] [address]     Blood Alcohol level:  Lab Results  Component Value Date   [facility] <10 12/24/2021   ETH <10 62/01/5596    Metabolic Disorder Labs: Lab Results  Component Value Date   HGBA1C 5.2 11/17/2021   MPG 102.54 11/17/2021   MPG 105.41 08/05/2018   No results found for: PROLACTIN Lab Results  Component Value Date   CHOL 117 11/17/2021   TRIG 96 11/17/2021   HDL 28 (L) 11/17/2021   CHOLHDL 4.2 11/17/2021   VLDL 19 11/17/2021   LDLCALC 70 11/17/2021   LDLCALC 87 08/22/2019    Physical Findings: AIMS:  , ,  ,  ,    CIWA:    COWS:     Musculoskeletal: Strength & Muscle Tone: within normal limits Gait & Station: normal Patient leans: N/A  Psychiatric Specialty Exam:  Presentation  General Appearance: No data recorded Eye Contact:No data recorded Speech:No data recorded Speech  Volume:No data recorded Handedness:No data recorded  Mood and Affect  Mood:No data recorded Affect:No data recorded  Thought Process  Thought Processes:No data recorded Descriptions of Associations:No data recorded Orientation:No data recorded Thought Content:No data recorded History of Schizophrenia/Schizoaffective disorder:No  Duration of Psychotic Symptoms:No data recorded Hallucinations:No data recorded Ideas of Reference:No data recorded Suicidal Thoughts:No data recorded Homicidal Thoughts:No data recorded  Sensorium  Memory:No data recorded Judgment:No data recorded Insight:No data recorded  Executive Functions  Concentration:No data recorded Attention Span:No data recorded Recall:No data recorded Fund of Knowledge:No data recorded Language:No data recorded  Psychomotor Activity  Psychomotor Activity:No data recorded  Assets  Assets:No data recorded  Sleep  Sleep:No data recorded   Physical Exam: Physical Exam Vitals and nursing note reviewed.  Constitutional:      Appearance: Normal appearance. He is normal weight.  Neurological:     General: No focal deficit present.     Mental Status: He is alert and oriented to person, place, and time.  Psychiatric:        Attention and Perception: Attention and perception normal.        Mood and Affect: Mood is depressed. Affect is flat.        Speech: Speech normal.        Behavior: Behavior normal. Behavior is cooperative.        Thought Content:  Thought content normal.        Cognition and Memory: Cognition and memory normal.        Judgment: Judgment normal.   Review of Systems  Constitutional: Negative.   HENT: Negative.    Eyes: Negative.   Respiratory: Negative.    Cardiovascular: Negative.   Gastrointestinal: Negative.   Genitourinary: Negative.   Musculoskeletal: Negative.   Skin: Negative.   Neurological: Negative.   Endo/Heme/Allergies: Negative.   Psychiatric/Behavioral:  Positive for  depression. The patient is nervous/anxious and has insomnia.   Blood pressure 96/69, pulse 81, temperature 97.9 F (36.6 C), temperature source Oral, resp. rate 18, height '5\' 10"'$  (1.778 m), weight 81.2 kg, SpO2 97 %. Body mass index is 25.69 kg/m.   Treatment Plan Summary: Daily contact with patient to assess and evaluate symptoms and progress in treatment, Medication management, and Plan add melatonin 10 mg at bedtime.  [place], DO 01/12/2022, 1:22 PM

## 2022-01-12 NOTE — Progress Notes (Signed)
[facility] ?[place], [place] ?01/12/22 ? ?Subjective:  ? ?LOS: 18 ?No intake/output data recorded. ? ?Patient seen sitting at table in day room ?Alert ?Able to answer simple questions ?Tolerating meals without nausea and vomiting ? ?Objective:  ?Vital signs in last 24 hours:  ?Temp:  [97.8 ?F (36.6 ?C)-98.7 ?F (37.1 ?C)] 97.8 ?F (36.6 ?C) (03/07 1524) ?Pulse Rate:  [74-87] 74 (03/07 1524) ?Resp:  [18] 18 (03/07 1524) ?BP: (94-123)/(66-80) 123/80 (03/07 1524) ?SpO2:  [95 %-100 %] 100 % (03/07 1524) ? ?Weight change:  ?Filed Weights  ? 12/25/21 2145  ?Weight: 81.2 kg  ? ? ?Intake/Output: ?  ?No intake or output data in the 24 hours ending 01/12/22 1605 ? ?Physical Exam: ?General:  No acute distress, sitting at table  ?HEENT  anicteric, moist oral mucous membrane  ?Pulm/lungs  normal breathing effort, lungs are clear to auscultation  ?CVS/Heart  regular rhythm, no rub or gallop  ?Abdomen:   Soft, nontender  ?Extremities:  + peripheral edema  ?Neurologic:  Alert, oriented, able to follow commands  ?Skin:  No acute rashes  ? ?Basic Metabolic Panel: ? ?Recent Labs  ?Lab 01/08/22 ?6948 01/09/22 ?5462 01/10/22 ?0902 01/11/22 ?7035  ?NA 134* 135 132* 131*  ?K 4.7 4.6 4.3 4.4  ?CL 102 100 98 98  ?CO2 '25 29 26 29  '$ ?GLUCOSE 97 95 129* 92  ?BUN 24* '22 21 22  '$ ?CREATININE 2.98* 2.88* 2.75* 2.60*  ?CALCIUM 9.3 9.2 9.2 9.2  ?PHOS  --  3.3 2.7  --   ? ? ? ? ?CBC: ?Recent Labs  ?Lab 01/08/22 ?0093 01/10/22 ?0902  ?WBC 3.2* 3.3*  ?NEUTROABS 1.8  --   ?HGB 14.5 13.8  ?HCT 44.1 42.1  ?MCV 83.2 83.0  ?PLT 92* 90*  ? ? ? ? No results found for: HEPBSAG, HEPBSAB, HEPBIGM ? ? ? ?Microbiology: ? ?No results found for this or any previous visit (from the past 240 hour(s)). ? ?Coagulation Studies: ?Recent Labs  ?  01/11/22 ?0613  ?LABPROT 22.5*  ?INR 2.0*  ? ? ? ?Urinalysis: ?No results for input(s): COLORURINE, LABSPEC, [place], GLUCOSEU, HGBUR, BILIRUBINUR, KETONESUR, PROTEINUR, UROBILINOGEN, NITRITE, LEUKOCYTESUR in the  last 72 hours. ? ?Invalid input(s): APPERANCEUR ?  ? ? ?Imaging: ?No results found. ? ? ?Medications:  ? ? ? amiodarone  200 mg Oral Daily  ? atorvastatin  20 mg Oral QHS  ? magnesium oxide  400 mg Oral BID  ? melatonin  10 mg Oral QHS  ? OLANZapine  15 mg Oral QHS  ? tacrolimus  0.5 mg Oral QODAY  ? tacrolimus  1 mg Oral QODAY  ? venlafaxine XR  150 mg Oral Q breakfast  ? warfarin  2 mg Oral Once per day on Sun Tue Wed Thu Fri Sat  ? warfarin  4 mg Oral Q Mon-1800  ? Warfarin - Physician Dosing Inpatient   Does not apply q1600  ? ?acetaminophen, alum & mag hydroxide-simeth, LORazepam, magnesium hydroxide, ondansetron ? ?Assessment/ Plan:  ?71 y.o. 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 ?Principal Problem: ?  Acute on chronic renal insufficiency ?Active Problems: ?  Atrial fibrillation ([place]) ?  Chronic systolic CHF (congestive heart failure) ([place]) ?  History of liver transplant ([place]) ?  History of kidney transplant ?  Chronic ITP (idiopathic thrombocytopenic purpura) (HCC) ?  MDD (major depressive disorder), recurrent episode ([place]) ? ? ?#.  AKI on CKD st 3B-T ?#Monitoring of chronic immunosuppression regimen ?History of renal transplant-followed at [facility] [place] ?History of liver transplant-followed at [facility] liver transplant clinic ?Recent Labs  ?  01/08/22 ?0623 01/09/22 ?0640 01/10/22 ?0902 01/11/22 ?1941  ?CREATININE 2.98* 2.88* 2.75* 2.60*  ? ?Outpatient transplant medication outpatient transplant immunosuppression regimen includes tacrolimus 0.5 mg twice daily alternating with 1 mg daily and 5 mg of prednisone daily.  Was requiring midodrine 5 mg 3 times daily for hypotension. ?Goal for Tacrolimus trough 2-4 ?Tac  level 5.4 on 01/09/22 ? Creatinine continues to slowly increase ? ?#. Anemia assessment ? ?Lab Results  ?Component Value Date  ? HGB 13.8 01/10/2022  ? ?Hemoglobin at goal ? ?#.  Bone and mineral metabolism assessment ? ?No results found for: PTH ?Lab Results  ?Component Value Date  ? PHOS 2.7 01/10/2022  ?Phosphorus in normal range ? ? ? ? ? LOS: 18 ?[place] ?3/7/20234:05 PM ? ?[facility] ?[place], [place] ?[phone]  ?

## 2022-01-12 NOTE — Group Note (Deleted)
[place] LCSW Group Therapy Note ? ? ? ?Group Date: 01/12/2022 ?Start Time: 1300 ?End Time: 1400 ? ?Type of Therapy and Topic:  Group Therapy:  Overcoming Obstacles ? ?Participation Level:  {BHH PARTICIPATION LEVEL:22264:::1} ? ?Mood: ? ?Description of Group:   ?In this group patients will be encouraged to explore what they see as obstacles to their own wellness and recovery. They will be guided to discuss their thoughts, feelings, and behaviors related to these obstacles. The group will process together ways to cope with barriers, with attention given to specific choices patients can make. Each patient will be challenged to identify changes they are motivated to make in order to overcome their obstacles. This group will be process-oriented, with patients participating in exploration of their own experiences as well as giving and receiving support and challenge from other group members. ? ?Therapeutic Goals: ?1. Patient will identify personal and current obstacles as they relate to admission. ?2. Patient will identify barriers that currently interfere with their wellness or overcoming obstacles.  ?3. Patient will identify feelings, thought process and behaviors related to these barriers. ?4. Patient will identify two changes they are willing to make to overcome these obstacles:  ? ? ?Summary of Patient Progress ? ? ?*** ? ? ?Therapeutic Modalities:   ?Cognitive Behavioral Therapy ?Solution Focused Therapy ?Motivational Interviewing ?Relapse Prevention Therapy ? ? ?Durenda Hurt, LCSWA ?

## 2022-01-12 NOTE — Plan of Care (Signed)
Patient Cooperative during shift.  Patient denies SI/HI/AVH.  Patient did endorse Anxiety.  Medication given to manage symptoms.  Patient compliant with medications given as ordered.  Pt denies any adverse reactions. Emotional support and encouragement given.  Patient did participate in therapeutic milieu. Patient did have adequate intake.  Pt informed to notify staff if any needs arise. Q 15 minute safety checks in place. Plan of care will continue.  ?Problem: Education: ?Goal: Knowledge of [place] [facility] Education information/materials will improve ?Outcome: Progressing ?Goal: Emotional status will improve ?Outcome: Progressing ?Goal: Mental status will improve ?Outcome: Progressing ?Goal: Verbalization of understanding the information provided will improve ?Outcome: Progressing ?  ?

## 2022-01-12 NOTE — Progress Notes (Signed)
Recreation Therapy Notes ? ?Date: 01/12/2022  ? ?Time: 1:15pm   ? ?Location: Courtyard   ? ?Behavioral response: Appropriate ? ?Intervention Topic:  Social Skills  ? ?Discussion/Intervention:  ?Group content on today was focused on social skills. The group defined social skills and identified ways they use social skills. Patients expressed what obstacles they face when trying to be social. Participants described the importance of social skills. The group listed ways to improve social skills and reasons to improve social skills. Individuals had an opportunity to learn new and improve social skills as well as identify their weaknesses. ? ?Clinical Observations/Feedback: ?Patient came to group and was able to identify their main form of communication and the importance of communicating. Participant was able to focus on the task and topic during group. Individual was social with peers and staff while participating in the intervention.   ?Leveda Kendrix LRT/CTRS  ? ? ? ? ? ? ? ?Lionardo Haze ?01/12/2022 2:30 PM ?

## 2022-01-13 ENCOUNTER — Telehealth: Payer: Self-pay | Admitting: *Deleted

## 2022-01-13 DIAGNOSIS — F333 Major depressive disorder, recurrent, severe with psychotic symptoms: Secondary | ICD-10-CM | POA: Diagnosis not present

## 2022-01-13 LAB — RENAL FUNCTION PANEL
Albumin: 3.1 g/dL — ABNORMAL LOW (ref 3.5–5.0)
Anion gap: 7 (ref 5–15)
BUN: 27 mg/dL — ABNORMAL HIGH (ref 8–23)
CO2: 26 mmol/L (ref 22–32)
Calcium: 9.2 mg/dL (ref 8.9–10.3)
Chloride: 98 mmol/L (ref 98–111)
Creatinine, Ser: 2.38 mg/dL — ABNORMAL HIGH (ref 0.61–1.24)
GFR, Estimated: 29 mL/min — ABNORMAL LOW (ref >60)
Glucose, Bld: 109 mg/dL — ABNORMAL HIGH (ref 70–99)
Phosphorus: 2.7 mg/dL (ref 2.5–4.6)
Potassium: 4.7 mmol/L (ref 3.5–5.1)
Sodium: 131 mmol/L — ABNORMAL LOW (ref 135–145)

## 2022-01-13 NOTE — Progress Notes (Signed)
Pt visible on the unit in the day room, no interaction with peers or staff.. Pt denies SI/HI and AVH. He reports his anxiety level 7/10, unknown cause. Pt pleasant and cooperative.  ?

## 2022-01-13 NOTE — Group Note (Unsigned)
[place] LCSW Group Therapy Note ? ? ?Group Date: 01/13/2022 ?Start Time: 1300 ?End Time: 1400 ? ? ?Type of Therapy/Topic:  Group Therapy:  Emotion Regulation ? ?Participation Level:  {BHH PARTICIPATION LEVEL:22264}  ? ?Mood: ? ?Description of Group:   ? The purpose of this group is to assist patients in learning to regulate negative emotions and experience positive emotions. Patients will be guided to discuss ways in which they have been vulnerable to their negative emotions. These vulnerabilities will be juxtaposed with experiences of positive emotions or situations, and patients challenged to use positive emotions to combat negative ones. Special emphasis will be placed on coping with negative emotions in conflict situations, and patients will process healthy conflict resolution skills. ? ?Therapeutic Goals: ?1. Patient will identify two positive emotions or experiences to reflect on in order to balance out negative emotions:  ?2. Patient will label two or more emotions that they find the most difficult to experience:  ?3. Patient will be able to demonstrate positive conflict resolution skills through discussion or role plays:  ? ?Summary of Patient Progress: ? ? ?*** ? ? ? ?Therapeutic Modalities:   ?Cognitive Behavioral Therapy ?Feelings Identification ?Dialectical Behavioral Therapy ? ? ?Durenda Hurt, LCSWA ?

## 2022-01-13 NOTE — Plan of Care (Signed)
Patient Cooperative during assessment.  Pt denies SI/HI/AVH and contracts for safety.  Patient did verbalize Anxiety and Depression.  Medication given to manage symptoms.  Support and encouragement given.  Pt compliant with scheduled medications and denies any adverse reactions to medications given.  Pt did participate in therapeutic milieu. Q 15 minute safety checks in place.  Plan of care will continue.   ?Problem: Education: ?Goal: Knowledge of [place] [facility] Education information/materials will improve ?Outcome: Progressing ?Goal: Emotional status will improve ?Outcome: Progressing ?Goal: Mental status will improve ?Outcome: Progressing ?Goal: Verbalization of understanding the information provided will improve ?Outcome: Progressing ?  ?

## 2022-01-13 NOTE — Progress Notes (Signed)
Patient is calm and cooperative. Patient endorses sleeping better last night after receiving melatonin. He stated "I feel ok today". Patient denies depression and rates anxiety a 3/10 this morning. Patient denies SI, HI, AVH and also denies pain.. Patient ate breakfast with fair appetite in the day room with peers and staff. AM scheduled meds administered as ordered with no difficulty. Patient is currently eating lunch. Patient remains safe on the unit with Q 15 minute safety check. ?

## 2022-01-13 NOTE — Plan of Care (Signed)
?  Problem: Education: ?Goal: Knowledge of [place] [facility] Education information/materials will improve ?Outcome: Progressing ?Goal: Emotional status will improve ?Outcome: Progressing ?Goal: Mental status will improve ?Outcome: Progressing ?Goal: Verbalization of understanding the information provided will improve ?Outcome: Progressing ?  ?Problem: Activity: ?Goal: Interest or engagement in activities will improve ?Outcome: Progressing ?Goal: Sleeping patterns will improve ?Outcome: Progressing ?  ?Problem: Coping: ?Goal: Ability to verbalize frustrations and anger appropriately will improve ?Outcome: Progressing ?Goal: Ability to demonstrate self-control will improve ?Outcome: Progressing ?  ?Problem: Health Behavior/Discharge Planning: ?Goal: Identification of resources available to assist in meeting health care needs will improve ?Outcome: Progressing ?Goal: Compliance with treatment plan for underlying cause of condition will improve ?Outcome: Progressing ?  ?Problem: Health Behavior/Discharge Planning: ?Goal: Identification of resources available to assist in meeting health care needs will improve ?Outcome: Progressing ?Goal: Compliance with treatment plan for underlying cause of condition will improve ?Outcome: Progressing ?  ?Problem: Physical Regulation: ?Goal: Ability to maintain clinical measurements within normal limits will improve ?Outcome: Progressing ?  ?Problem: Safety: ?Goal: Periods of time without injury will increase ?Outcome: Progressing ?  ?Problem: Education: ?Goal: Utilization of techniques to improve thought processes will improve ?Outcome: Progressing ?Goal: Knowledge of the prescribed therapeutic regimen will improve ?Outcome: Progressing ?  ?Problem: Activity: ?Goal: Interest or engagement in leisure activities will improve ?Outcome: Progressing ?Goal: Imbalance in normal sleep/wake cycle will improve ?Outcome: Progressing ?  ?

## 2022-01-13 NOTE — Progress Notes (Signed)
Recreation Therapy Notes ? ?Date: 01/13/2022 ?  ?Time: 1:15pm   ?  ?Location: Craft room     ?  ?Behavioral response: N/A ?  ?Intervention Topic: Strengths  ?  ?Discussion/Intervention: ?Patient refused to attend group.  ?  ?Clinical Observations/Feedback:  ?Patient refused to attend group.  ?  ?Byan Poplaski LRT/CTRS ? ? ? ? ? ? ? ?Avi Archuleta ?01/13/2022 1:59 PM ?

## 2022-01-13 NOTE — Progress Notes (Signed)
[facility] MD Progress Note  01/13/2022 11:06 AM [name]  MRN:  [id number] Subjective: [name] states that he slept a little bit better last night after starting melatonin.  His mood is improving.  He still very flat and withdrawn.  He still complains of not sleeping very well.  We discussed getting him a sleep study upon discharge.  He is able to contract for safety in the hospital.  Principal Problem: Acute on chronic renal insufficiency Diagnosis: Principal Problem:   Acute on chronic renal insufficiency Active Problems:   Atrial fibrillation (HCC)   Chronic systolic CHF (congestive heart failure) ([place])   History of liver transplant ([place])   History of kidney transplant   Chronic ITP (idiopathic thrombocytopenic purpura) (HCC)   MDD (major depressive disorder), recurrent episode ([place])  Total Time spent with patient: 15 minutes  Past Psychiatric History: [facility]  Past Medical History:  Past Medical History:  Diagnosis Date   Atrial fibrillation ([place]) 08/2009   CAD (coronary artery disease)    s/p CABG -- 1992   Depression    ESRD (end stage renal disease) ([place])    HLD (hyperlipidemia)    HTN (hypertension)    Idiopathic thrombocytopenia (HCC)     Past Surgical History:  Procedure Laterality Date   APPENDECTOMY     CORONARY ARTERY BYPASS GRAFT     FACIAL COSMETIC SURGERY     GALLBLADDER SURGERY     HERNIA REPAIR     KIDNEY TRANSPLANT     LEFT HEART CATH AND CORONARY ANGIOGRAPHY N/A 08/07/2018   Procedure: LEFT HEART CATH AND CORONARY ANGIOGRAPHY;  Surgeon: Sherren Mocha, MD;  Location: [facility];  Service: Cardiovascular;  Laterality: N/A;   LIVER TRANSPLANT     PRESSURE SENSOR/CARDIOMEMS N/A 08/04/2021   Procedure: PRESSURE SENSOR/CARDIOMEMS;  Surgeon: Larey Dresser, MD;  Location: [facility];  Service: Cardiovascular;  Laterality: N/A;   RIGHT HEART CATH N/A 06/25/2021   Procedure: RIGHT HEART CATH;  Surgeon: Larey Dresser, MD;   Location: [facility];  Service: Cardiovascular;  Laterality: N/A;   Family History:  Family History  Problem Relation Age of Onset   Atrial fibrillation Mother    Breast cancer Mother    Renal cancer Father    Hypertension Brother    Hyperlipidemia Brother    Other Brother        stent    Renal Disease Maternal Grandmother    Stroke Maternal Grandmother    Heart attack Maternal Grandfather    Cancer Paternal Grandmother    Heart failure Paternal Grandfather    Emphysema Paternal Grandfather    Bronchiolitis Paternal Grandfather     Social History:  Social History   Substance and Sexual Activity  Alcohol Use No     Social History   Substance and Sexual Activity  Drug Use No    Social History   Socioeconomic History   Marital status: Single    Spouse name: Not on file   Number of children: 0   Years of education: Not on file   Highest education level: Not on file  Occupational History   Occupation: retired  Tobacco Use   Smoking status: Never   Smokeless tobacco: Never  Vaping Use   Vaping Use: Never used  Substance and Sexual Activity   Alcohol use: No   Drug use: No   Sexual activity: Not on file  Other Topics Concern   Not on file  Social History  Narrative   Not on file   Social Determinants of Health   Financial Resource Strain: Low Risk    Difficulty of Paying Living Expenses: Not very hard  Food Insecurity: No Food Insecurity   Worried About Running Out of Food in the Last Year: Never true   Ran Out of Food in the Last Year: Never true  Transportation Needs: No Transportation Needs   Lack of Transportation (Medical): No   Lack of Transportation (Non-Medical): No  Physical Activity: Not on file  Stress: Not on file  Social Connections: Not on file   Additional Social History:                         Sleep: Fair  Appetite:  Fair  Current Medications: Current Facility-Administered Medications  Medication Dose Route  Frequency Provider Last Rate Last Admin   acetaminophen (TYLENOL) tablet 650 mg  650 mg Oral Q6H PRN Suella Broad, FNP   650 mg at 01/12/22 2349   alum & mag hydroxide-simeth (MAALOX/MYLANTA) 200-200-20 MG/5ML suspension 30 mL  30 mL Oral Q4H PRN Suella Broad, FNP       amiodarone (PACERONE) tablet 200 mg  200 mg Oral Daily Suella Broad, FNP   200 mg at 01/13/22 4403   atorvastatin (LIPITOR) tablet 20 mg  20 mg Oral QHS Suella Broad, FNP   20 mg at 01/12/22 2207   LORazepam (ATIVAN) tablet 2 mg  2 mg Oral QHS PRN Parks Ranger, DO   2 mg at 01/12/22 2211   magnesium hydroxide (MILK OF MAGNESIA) suspension 30 mL  30 mL Oral Daily PRN Suella Broad, FNP       magnesium oxide (MAG-OX) tablet 400 mg  400 mg Oral BID Suella Broad, FNP   400 mg at 01/13/22 4742   melatonin tablet 10 mg  10 mg Oral QHS Parks Ranger, DO   10 mg at 01/12/22 2207   OLANZapine (ZYPREXA) tablet 15 mg  15 mg Oral QHS Parks Ranger, DO   15 mg at 01/12/22 2208   ondansetron (ZOFRAN) tablet 4 mg  4 mg Oral Q8H PRN He, [name], MD   4 mg at 12/27/21 0848   tacrolimus (PROGRAF) capsule 0.5 mg  0.5 mg Oral [name], FNP   0.5 mg at 01/13/22 0918   tacrolimus (PROGRAF) capsule 1 mg  1 mg Oral [name], FNP   1 mg at 01/12/22 0920   venlafaxine XR (EFFEXOR-XR) 24 hr capsule 150 mg  150 mg Oral Q breakfast Parks Ranger, DO   150 mg at 01/13/22 5956   warfarin (COUMADIN) tablet 2 mg  2 mg Oral Once per day on Sun Tue Wed Thu Fri Sat Parks Ranger, DO   2 mg at 01/12/22 1705   warfarin (COUMADIN) tablet 4 mg  4 mg Oral Q Mon-1800 Suella Broad, FNP   4 mg at 01/11/22 1705   Warfarin - Physician Dosing Inpatient   Does not apply q1600 [name], [facility]   1 each at 01/12/22 1703    Lab Results: No results found for this or any previous visit (from the past 48 hour(s)).  Blood  Alcohol level:  Lab Results  Component Value Date   [facility] <10 12/24/2021   ETH <10 38/75/6433    Metabolic Disorder Labs: Lab Results  Component Value Date   HGBA1C 5.2 11/17/2021  MPG 102.54 11/17/2021   MPG 105.41 08/05/2018   No results found for: PROLACTIN Lab Results  Component Value Date   CHOL 117 11/17/2021   TRIG 96 11/17/2021   HDL 28 (L) 11/17/2021   CHOLHDL 4.2 11/17/2021   VLDL 19 11/17/2021   LDLCALC 70 11/17/2021   LDLCALC 87 08/22/2019    Physical Findings: AIMS:  , ,  ,  ,    CIWA:    COWS:     Musculoskeletal: Strength & Muscle Tone: within normal limits Gait & Station: normal Patient leans: N/A  Psychiatric Specialty Exam:  Presentation  General Appearance: No data recorded Eye Contact:No data recorded Speech:No data recorded Speech Volume:No data recorded Handedness:No data recorded  Mood and Affect  Mood:No data recorded Affect:No data recorded  Thought Process  Thought Processes:No data recorded Descriptions of Associations:No data recorded Orientation:No data recorded Thought Content:No data recorded History of Schizophrenia/Schizoaffective disorder:No  Duration of Psychotic Symptoms:No data recorded Hallucinations:No data recorded Ideas of Reference:No data recorded Suicidal Thoughts:No data recorded Homicidal Thoughts:No data recorded  Sensorium  Memory:No data recorded Judgment:No data recorded Insight:No data recorded  Executive Functions  Concentration:No data recorded Attention Span:No data recorded Recall:No data recorded Fund of Knowledge:No data recorded Language:No data recorded  Psychomotor Activity  Psychomotor Activity:No data recorded  Assets  Assets:No data recorded  Sleep  Sleep:No data recorded   Physical Exam: Physical Exam Vitals and nursing note reviewed.  Constitutional:      Appearance: Normal appearance. He is normal weight.  Neurological:     General: No focal deficit present.      Mental Status: He is alert and oriented to person, place, and time.  Psychiatric:        Attention and Perception: Attention and perception normal.        Mood and Affect: Mood is depressed. Affect is flat.        Speech: Speech normal.        Behavior: Behavior normal. Behavior is cooperative.        Thought Content: Thought content normal.        Cognition and Memory: Cognition and memory normal.        Judgment: Judgment normal.   Review of Systems  Psychiatric/Behavioral:  Positive for depression and hallucinations. The patient has insomnia.   Blood pressure 138/86, pulse 78, temperature 97.6 F (36.4 C), temperature source Oral, resp. rate 18, height '5\' 10"'$  (1.778 m), weight 81.2 kg, SpO2 94 %. Body mass index is 25.69 kg/m.   Treatment Plan Summary: Daily contact with patient to assess and evaluate symptoms and progress in treatment, Medication management, and Plan continue current medications.  Consider Wellbutrin.  [place], DO 01/13/2022, 11:06 AM

## 2022-01-13 NOTE — Telephone Encounter (Signed)
Received a fax on the pt regarding his INR; pt is currently admitted and this result is from the hospital (2.0 and dated 01/11/22). They are managing his warfarin while there. Will await for pt to be discharged from Hospital to follow up on Anticoagulation Monitoring. ?

## 2022-01-14 DIAGNOSIS — F333 Major depressive disorder, recurrent, severe with psychotic symptoms: Secondary | ICD-10-CM | POA: Diagnosis not present

## 2022-01-14 LAB — PROTIME-INR
INR: 2 — ABNORMAL HIGH (ref 0.8–1.2)
Prothrombin Time: 22.4 seconds — ABNORMAL HIGH (ref 11.4–15.2)

## 2022-01-14 MED ORDER — RISPERIDONE 1 MG PO TABS
0.5000 mg | ORAL_TABLET | ORAL | Status: DC
  Administered 2022-01-14 – 2022-01-22 (×16): 0.5 mg via ORAL
  Filled 2022-01-14 (×16): qty 1

## 2022-01-14 NOTE — Plan of Care (Signed)
Patient is alert and oriented times 4. Mood and affect sullen/flat. Patient lays in bed all day- gets up for meals. Patient c/o right hip pain at this time. He denies SI, HI, and AVH. Also denies feelings of depression at this time. Endorses feelings of anxiety 3/10. States he did get a little sleep last night. Morning meds given whole by mouth W/O difficulty. Ate breakfast in day room- appetite good. Patient remains on unit with Q15 minute checks in place.  ? ? ?Problem: Education: ?Goal: Knowledge of [facility] Education information/materials will improve ?Outcome: Progressing ?Goal: Emotional status will improve ?Outcome: Progressing ?Goal: Mental status will improve ?Outcome: Progressing ?Goal: Verbalization of understanding the information provided will improve ?Outcome: Progressing ?  ?Problem: Activity: ?Goal: Interest or engagement in activities will improve ?Outcome: Progressing ?Goal: Sleeping patterns will improve ?Outcome: Progressing ?  ?Problem: Coping: ?Goal: Ability to verbalize frustrations and anger appropriately will improve ?Outcome: Progressing ?Goal: Ability to demonstrate self-control will improve ?Outcome: Progressing ?  ?Problem: Health Behavior/Discharge Planning: ?Goal: Identification of resources available to assist in meeting health care needs will improve ?Outcome: Progressing ?Goal: Compliance with treatment plan for underlying cause of condition will improve ?Outcome: Progressing ?  ?Problem: Physical Regulation: ?Goal: Ability to maintain clinical measurements within normal limits will improve ?Outcome: Progressing ?  ?Problem: Safety: ?Goal: Periods of time without injury will increase ?Outcome: Progressing ?  ?Problem: Education: ?Goal: Utilization of techniques to improve thought processes will improve ?Outcome: Progressing ?Goal: Knowledge of the prescribed therapeutic regimen will improve ?Outcome: Progressing ?  ?Problem: Activity: ?Goal: Interest or engagement in  leisure activities will improve ?Outcome: Progressing ?Goal: Imbalance in normal sleep/wake cycle will improve ?Outcome: Progressing ?  ?Problem: Coping: ?Goal: Coping ability will improve ?Outcome: Progressing ?Goal: Will verbalize feelings ?Outcome: Progressing ?  ?Problem: Health Behavior/Discharge Planning: ?Goal: Ability to make decisions will improve ?Outcome: Progressing ?Goal: Compliance with therapeutic regimen will improve ?Outcome: Progressing ?  ?Problem: Role Relationship: ?Goal: Will demonstrate positive changes in social behaviors and relationships ?Outcome: Progressing ?  ?Problem: Safety: ?Goal: Ability to disclose and discuss suicidal ideas will improve ?Outcome: Progressing ?Goal: Ability to identify and utilize support systems that promote safety will improve ?Outcome: Progressing ?  ?Problem: Self-Concept: ?Goal: Will verbalize positive feelings about self ?Outcome: Progressing ?Goal: Level of anxiety will decrease ?Outcome: Progressing ?  ?Problem: Education: ?Goal: Ability to make informed decisions regarding treatment will improve ?Outcome: Progressing ?  ?Problem: Coping: ?Goal: Coping ability will improve ?Outcome: Progressing ?  ?Problem: Health Behavior/Discharge Planning: ?Goal: Identification of resources available to assist in meeting health care needs will improve ?Outcome: Progressing ?  ?Problem: Medication: ?Goal: Compliance with prescribed medication regimen will improve ?Outcome: Progressing ?  ?Problem: Self-Concept: ?Goal: Ability to disclose and discuss suicidal ideas will improve ?Outcome: Progressing ?Goal: Will verbalize positive feelings about self ?Outcome: Progressing ?  ?Problem: Education: ?Goal: Ability to state activities that reduce stress will improve ?Outcome: Progressing ?  ?Problem: Coping: ?Goal: Ability to identify and develop effective coping behavior will improve ?Outcome: Progressing ?  ?Problem: Self-Concept: ?Goal: Ability to identify factors that promote  anxiety will improve ?Outcome: Progressing ?Goal: Level of anxiety will decrease ?Outcome: Progressing ?Goal: Ability to modify response to factors that promote anxiety will improve ?Outcome: Progressing ?  ?

## 2022-01-14 NOTE — Progress Notes (Signed)
[facility] MD Progress Note  01/14/2022 1:54 PM [name]  MRN:  [id number] Subjective: [name] continues to improve but he is obsessively worried about his sleep.  When I discussed the situation with him he says is not a physical anxiety but that it is more like racing thoughts and obsessions.  He does state that he is starting to feel better and he is sleeping a little bit better but is constantly worrying.  Principal Problem: Acute on chronic renal insufficiency Diagnosis: Principal Problem:   Acute on chronic renal insufficiency Active Problems:   Atrial fibrillation (HCC)   Chronic systolic CHF (congestive heart failure) (HCC)   History of liver transplant ([place])   History of kidney transplant   Chronic ITP (idiopathic thrombocytopenic purpura) (HCC)   MDD (major depressive disorder), recurrent episode ([place])  Total Time spent with patient: 15 minutes  Past Psychiatric History: yes  Past Medical History:  Past Medical History:  Diagnosis Date   Atrial fibrillation ([place]) 08/2009   CAD (coronary artery disease)    s/p CABG -- 1992   Depression    ESRD (end stage renal disease) ([place])    HLD (hyperlipidemia)    HTN (hypertension)    Idiopathic thrombocytopenia (HCC)     Past Surgical History:  Procedure Laterality Date   APPENDECTOMY     CORONARY ARTERY BYPASS GRAFT     FACIAL COSMETIC SURGERY     GALLBLADDER SURGERY     HERNIA REPAIR     KIDNEY TRANSPLANT     LEFT HEART CATH AND CORONARY ANGIOGRAPHY N/A 08/07/2018   Procedure: LEFT HEART CATH AND CORONARY ANGIOGRAPHY;  Surgeon: Sherren Mocha, MD;  Location: [facility];  Service: Cardiovascular;  Laterality: N/A;   LIVER TRANSPLANT     PRESSURE SENSOR/CARDIOMEMS N/A 08/04/2021   Procedure: PRESSURE SENSOR/CARDIOMEMS;  Surgeon: Larey Dresser, MD;  Location: [facility];  Service: Cardiovascular;  Laterality: N/A;   RIGHT HEART CATH N/A 06/25/2021   Procedure: RIGHT HEART CATH;  Surgeon: Larey Dresser,  MD;  Location: [facility];  Service: Cardiovascular;  Laterality: N/A;   Family History:  Family History  Problem Relation Age of Onset   Atrial fibrillation Mother    Breast cancer Mother    Renal cancer Father    Hypertension Brother    Hyperlipidemia Brother    Other Brother        stent    Renal Disease Maternal Grandmother    Stroke Maternal Grandmother    Heart attack Maternal Grandfather    Cancer Paternal Grandmother    Heart failure Paternal Grandfather    Emphysema Paternal Grandfather    Bronchiolitis Paternal Grandfather     Social History:  Social History   Substance and Sexual Activity  Alcohol Use No     Social History   Substance and Sexual Activity  Drug Use No    Social History   Socioeconomic History   Marital status: Single    Spouse name: Not on file   Number of children: 0   Years of education: Not on file   Highest education level: Not on file  Occupational History   Occupation: retired  Tobacco Use   Smoking status: Never   Smokeless tobacco: Never  Vaping Use   Vaping Use: Never used  Substance and Sexual Activity   Alcohol use: No   Drug use: No   Sexual activity: Not on file  Other Topics Concern   Not on file  Social History Narrative   Not on file   Social Determinants of Health   Financial Resource Strain: Low Risk    Difficulty of Paying Living Expenses: Not very hard  Food Insecurity: No Food Insecurity   Worried About Charity fundraiser in the Last Year: Never true   Ran Out of Food in the Last Year: Never true  Transportation Needs: No Transportation Needs   Lack of Transportation (Medical): No   Lack of Transportation (Non-Medical): No  Physical Activity: Not on file  Stress: Not on file  Social Connections: Not on file   Additional Social History:                         Sleep: Poor  Appetite:  Fair  Current Medications: Current Facility-Administered Medications  Medication Dose Route  Frequency Provider Last Rate Last Admin   acetaminophen (TYLENOL) tablet 650 mg  650 mg Oral Q6H PRN Suella Broad, FNP   650 mg at 01/14/22 0906   alum & mag hydroxide-simeth (MAALOX/MYLANTA) 200-200-20 MG/5ML suspension 30 mL  30 mL Oral Q4H PRN Suella Broad, FNP       amiodarone (PACERONE) tablet 200 mg  200 mg Oral Daily Starkes-Perry, Takia S, FNP   200 mg at 01/14/22 0900   atorvastatin (LIPITOR) tablet 20 mg  20 mg Oral QHS Suella Broad, FNP   20 mg at 01/13/22 2155   LORazepam (ATIVAN) tablet 2 mg  2 mg Oral QHS PRN Parks Ranger, DO   2 mg at 01/14/22 0117   magnesium hydroxide (MILK OF MAGNESIA) suspension 30 mL  30 mL Oral Daily PRN Suella Broad, FNP       magnesium oxide (MAG-OX) tablet 400 mg  400 mg Oral BID Suella Broad, FNP   400 mg at 01/14/22 0900   melatonin tablet 10 mg  10 mg Oral QHS Parks Ranger, DO   10 mg at 01/13/22 2156   OLANZapine (ZYPREXA) tablet 15 mg  15 mg Oral QHS Parks Ranger, DO   15 mg at 01/13/22 2158   ondansetron (ZOFRAN) tablet 4 mg  4 mg Oral Q8H PRN He, [name], MD   4 mg at 12/27/21 0848   risperiDONE (RISPERDAL) tablet 0.5 mg  0.5 mg Oral BH-q8a4p Corinn Stoltzfus Edward, DO       tacrolimus (PROGRAF) capsule 0.5 mg  0.5 mg Oral [name], FNP   0.5 mg at 01/13/22 0918   tacrolimus (PROGRAF) capsule 1 mg  1 mg Oral [name], FNP   1 mg at 01/14/22 0900   venlafaxine XR (EFFEXOR-XR) 24 hr capsule 150 mg  150 mg Oral Q breakfast Parks Ranger, DO   150 mg at 01/14/22 0900   warfarin (COUMADIN) tablet 2 mg  2 mg Oral Once per day on Sun Tue Wed Thu Fri Sat Parks Ranger, DO   2 mg at 01/13/22 1730   warfarin (COUMADIN) tablet 4 mg  4 mg Oral Q Mon-1800 Suella Broad, FNP   4 mg at 01/11/22 1705   Warfarin - Physician Dosing Inpatient   Does not apply q1600 [name], RPH   1 each at 01/13/22 1640    Lab  Results:  Results for orders placed or performed during the hospital encounter of 12/25/21 (from the past 48 hour(s))  Renal function panel     Status: Abnormal   Collection Time:  01/13/22  7:51 PM  Result Value Ref Range   Sodium 131 (L) 135 - 145 mmol/L   Potassium 4.7 3.5 - 5.1 mmol/L   Chloride 98 98 - 111 mmol/L   CO2 26 22 - 32 mmol/L   Glucose, Bld 109 (H) 70 - 99 mg/dL    Comment: Glucose reference range applies only to samples taken after fasting for at least 8 hours.   BUN 27 (H) 8 - 23 mg/dL   Creatinine, Ser 2.38 (H) 0.61 - 1.24 mg/dL   Calcium 9.2 8.9 - 10.3 mg/dL   Phosphorus 2.7 2.5 - 4.6 mg/dL   Albumin 3.1 (L) 3.5 - 5.0 g/dL   GFR, Estimated 29 (L) >60 mL/min    Comment: (NOTE) Calculated using the CKD-EPI Creatinine Equation (2021)    Anion gap 7 5 - 15    Comment: Performed at [facility], [address] [place], [place] [address]  Protime-INR     Status: Abnormal   Collection Time: 01/14/22  6:26 AM  Result Value Ref Range   Prothrombin Time 22.4 (H) 11.4 - 15.2 seconds   INR 2.0 (H) 0.8 - 1.2    Comment: (NOTE) INR goal varies based on device and disease states. Performed at [facility], [address] [place], [place] [address]     Blood Alcohol level:  Lab Results  Component Value Date   [facility] <10 12/24/2021   ETH <10 40/06/6760    Metabolic Disorder Labs: Lab Results  Component Value Date   HGBA1C 5.2 11/17/2021   MPG 102.54 11/17/2021   MPG 105.41 08/05/2018   No results found for: PROLACTIN Lab Results  Component Value Date   CHOL 117 11/17/2021   TRIG 96 11/17/2021   HDL 28 (L) 11/17/2021   CHOLHDL 4.2 11/17/2021   VLDL 19 11/17/2021   LDLCALC 70 11/17/2021   LDLCALC 87 08/22/2019    Physical Findings: AIMS:  , ,  ,  ,    CIWA:    COWS:     Musculoskeletal: Strength & Muscle Tone: within normal limits Gait & Station: normal Patient leans: N/A  Psychiatric Specialty Exam:  Presentation  General  Appearance: No data recorded Eye Contact:No data recorded Speech:No data recorded Speech Volume:No data recorded Handedness:No data recorded  Mood and Affect  Mood:No data recorded Affect:No data recorded  Thought Process  Thought Processes:No data recorded Descriptions of Associations:No data recorded Orientation:No data recorded Thought Content:No data recorded History of Schizophrenia/Schizoaffective disorder:No  Duration of Psychotic Symptoms:No data recorded Hallucinations:No data recorded Ideas of Reference:No data recorded Suicidal Thoughts:No data recorded Homicidal Thoughts:No data recorded  Sensorium  Memory:No data recorded Judgment:No data recorded Insight:No data recorded  Executive Functions  Concentration:No data recorded Attention Span:No data recorded Recall:No data recorded Fund of Knowledge:No data recorded Language:No data recorded  Psychomotor Activity  Psychomotor Activity:No data recorded  Assets  Assets:No data recorded  Sleep  Sleep:No data recorded   Physical Exam: Physical Exam Vitals and nursing note reviewed.  Constitutional:      Appearance: Normal appearance. He is normal weight.  Neurological:     General: No focal deficit present.     Mental Status: He is alert and oriented to person, place, and time.  Psychiatric:        Attention and Perception: Attention and perception normal.        Mood and Affect: Mood is anxious and depressed. Affect is flat.        Speech: Speech normal.  Behavior: Behavior normal. Behavior is cooperative.        Thought Content: Thought content is paranoid.        Cognition and Memory: Cognition and memory normal.        Judgment: Judgment normal.   Review of Systems  Constitutional: Negative.   HENT: Negative.    Eyes: Negative.   Respiratory: Negative.    Cardiovascular: Negative.   Gastrointestinal: Negative.   Genitourinary: Negative.   Musculoskeletal: Negative.   Skin:  Negative.   Neurological: Negative.   Endo/Heme/Allergies: Negative.   Psychiatric/Behavioral:  Positive for depression. The patient is nervous/anxious and has insomnia.  Maudie Mercury continues to improve. Blood pressure 129/74, pulse 65, temperature (!) 97.4 F (36.3 C), temperature source Oral, resp. rate 18, height '5\' 10"'$  (1.778 m), weight 81.2 kg, SpO2 100 %. Body mass index is 25.69 kg/m.   Treatment Plan Summary: Daily contact with patient to assess and evaluate symptoms and progress in treatment, Medication management, and Plan continue current medications and add Risperdal 0.5 mg twice a day.  Parks Ranger, DO 01/14/2022, 1:54 PM

## 2022-01-14 NOTE — Progress Notes (Signed)
Recreation Therapy Notes ? ? ?Date: 01/14/2022 ? ?Time: 1:30pm   ? ?Location: Craft room   ? ?Behavioral response: Appropriate ? ?Intervention Topic: Happiness  ? ?Discussion/Intervention:  ?Group content today was focused on Happiness. The group defined happiness and described where happiness comes from. Individuals identified what makes them happy and how they go about making others happy. Patients expressed things that stop them from being happy and ways they can improve their happiness. The group stated reasons why it is important to be happy. The group participated in the intervention ?My Happiness?, where they had a chance to identify and express things that make them happy. ?Clinical Observations/Feedback: ?Patient came to group was focused on what peers and staff had to say about happiness. He defined happiness as being content. Individual was social with staff and peers while participating in the intervention.  ?[name] LRT/CTRS  ? ? ? ? ? ? ? ? ?[name] ?01/14/2022 3:20 PM ?

## 2022-01-14 NOTE — Plan of Care (Signed)
?  Problem: Communication ?Goal: STG - Patient will identify benefit of improved communication within 5 recreation therapy group sessions ?Description: STG - Patient will identify benefit of improved communication within 5 recreation therapy group sessions ?Outcome: Progressing ?  ?

## 2022-01-14 NOTE — Group Note (Signed)
[facility] LCSW Group Therapy Note ? ? ?Group Date: 01/14/2022 ?Start Time: 1400 ?End Time: 1440 ? ? ?Type of Therapy/Topic:  Group Therapy:  Balance in Life ? ?Participation Level:  Active  ? ?Description of Group:   ? This group will address the concept of balance and how it feels and looks when one is unbalanced. Patients will be encouraged to process areas in their lives that are out of balance, and identify reasons for remaining unbalanced. Facilitators will guide patients utilizing problem- solving interventions to address and correct the stressor making their life unbalanced. Understanding and applying boundaries will be explored and addressed for obtaining  and maintaining a balanced life. Patients will be encouraged to explore ways to assertively make their unbalanced needs known to significant others in their lives, using other group members and facilitator for support and feedback. ? ?Therapeutic Goals: ?Patient will identify two or more emotions or situations they have that consume much of in their lives. ?Patient will identify signs/triggers that life has become out of balance:  ?Patient will identify two ways to set boundaries in order to achieve balance in their lives:  ?Patient will demonstrate ability to communicate their needs through discussion and/or role plays ? ?Summary of Patient Progress: ? ?Patient was present for the entirety of the group session. Patient was an active listener and participated in the topic of discussion, provided helpful advice to others, and added nuance to topic of conversation. CSW discussed pt's coping skills regarding activities pt could continue at home. 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  ? ? ? ? ?Therapeutic Modalities:   ?Cognitive Behavioral Therapy ?Solution-Focused Therapy ?Assertiveness Training ? ? ?Love Chowning A [place], LCSWA ?

## 2022-01-14 NOTE — Progress Notes (Signed)
[facility] ?[place], [place] ?01/14/22 ? ?Subjective:  ? ?LOS: 20 ?03/08 0701 - 03/09 0700 ?In: 240 [P.O.:240] ?Out: -  ? ?Patient seen sitting in day room watching TV ?Appears alert and oriented ?Tolerating meals ?Remains on room air ?Lower extremity edema remains ? ?Creatinine 2.4 ? ?Objective:  ?Vital signs in last 24 hours:  ?Temp:  [97.3 ?F (36.3 ?C)-97.4 ?F (36.3 ?C)] 97.4 ?F (36.3 ?C) (03/08 2016) ?Pulse Rate:  [65-84] 65 (03/08 2016) ?Resp:  [18] 18 (03/08 1645) ?BP: (113-129)/(63-74) 129/74 (03/08 2016) ?SpO2:  [100 %] 100 % (03/08 2016) ? ?Weight change:  ?Filed Weights  ? 12/25/21 2145  ?Weight: 81.2 kg  ? ? ?Intake/Output: ?  ?No intake or output data in the 24 hours ending 01/14/22 1508 ? ?Physical Exam: ?General:  No acute distress, sitting at table  ?HEENT  anicteric, moist oral mucous membrane  ?Pulm/lungs  normal breathing effort, lungs are clear to auscultation  ?CVS/Heart  regular rhythm, no rub or gallop  ?Abdomen:   Soft, nontender  ?Extremities:  + peripheral edema  ?Neurologic:  Alert, oriented, able to follow commands  ?Skin:  No acute rashes  ? ?Basic Metabolic Panel: ? ?Recent Labs  ?Lab 01/08/22 ?3664 01/09/22 ?4034 01/10/22 ?0902 01/11/22 ?7425 01/13/22 ?1951  ?NA 134* 135 132* 131* 131*  ?K 4.7 4.6 4.3 4.4 4.7  ?CL 102 100 98 98 98  ?CO2 '25 29 26 29 26  '$ ?GLUCOSE 97 95 129* 92 109*  ?BUN 24* '22 21 22 '$ 27*  ?CREATININE 2.98* 2.88* 2.75* 2.60* 2.38*  ?CALCIUM 9.3 9.2 9.2 9.2 9.2  ?PHOS  --  3.3 2.7  --  2.7  ? ? ? ? ?CBC: ?Recent Labs  ?Lab 01/08/22 ?9563 01/10/22 ?0902  ?WBC 3.2* 3.3*  ?NEUTROABS 1.8  --   ?HGB 14.5 13.8  ?HCT 44.1 42.1  ?MCV 83.2 83.0  ?PLT 92* 90*  ? ? ? ? No results found for: HEPBSAG, HEPBSAB, HEPBIGM ? ? ? ?Microbiology: ? ?No results found for this or any previous visit (from the past 240 hour(s)). ? ?Coagulation Studies: ?Recent Labs  ?  01/14/22 ?0626  ?LABPROT 22.4*  ?INR 2.0*  ? ? ? ?Urinalysis: ?No results for input(s): COLORURINE, LABSPEC,  [place], GLUCOSEU, HGBUR, BILIRUBINUR, KETONESUR, PROTEINUR, UROBILINOGEN, NITRITE, LEUKOCYTESUR in the last 72 hours. ? ?Invalid input(s): APPERANCEUR ?  ? ? ?Imaging: ?No results found. ? ? ?Medications:  ? ? ? amiodarone  200 mg Oral Daily  ? atorvastatin  20 mg Oral QHS  ? magnesium oxide  400 mg Oral BID  ? melatonin  10 mg Oral QHS  ? OLANZapine  15 mg Oral QHS  ? risperiDONE  0.5 mg Oral BH-q8a4p  ? tacrolimus  0.5 mg Oral QODAY  ? tacrolimus  1 mg Oral QODAY  ? venlafaxine XR  150 mg Oral Q breakfast  ? warfarin  2 mg Oral Once per day on Sun Tue Wed Thu Fri Sat  ? warfarin  4 mg Oral Q Mon-1800  ? Warfarin - Physician Dosing Inpatient   Does not apply q1600  ? ?acetaminophen, alum & mag hydroxide-simeth, LORazepam, magnesium hydroxide, ondansetron ? ?Assessment/ Plan:  ?71 y.o. 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 ?Principal Problem: ?  Acute on chronic renal insufficiency ?  Active Problems: ?  Atrial fibrillation ([place]) ?  Chronic systolic CHF (congestive heart failure) ([place]) ?  History of liver transplant ([place]) ?  History of kidney transplant ?  Chronic ITP (idiopathic thrombocytopenic purpura) (HCC) ?  MDD (major depressive disorder), recurrent episode ([place]) ? ? ?#.  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  01/09/22 ?0640 01/10/22 ?0902 01/11/22 ?0613 01/13/22 ?1951  ?CREATININE 2.88* 2.75* 2.60* 2.38*  ? ?Outpatient transplant medication outpatient transplant immunosuppression regimen includes tacrolimus 0.5 mg twice daily alternating  with 1 mg daily and 5 mg of prednisone daily.  Was requiring midodrine 5 mg 3 times daily for hypotension. ?Goal for Tacrolimus trough 2-4 ?Tac level 5.4 on 01/09/22.  Diuretics held ? Renal function continues to improve.  Continue to encourage oral intake. ? ?#. Anemia assessment ? ?Lab Results  ?Component Value Date  ? HGB 13.8 01/10/2022  ? ?Hemoglobin above desired target ? ?#.  Bone and mineral metabolism assessment ? ?No results found for: PTH ?Lab Results  ?Component Value Date  ? PHOS 2.7 01/13/2022  ?Phosphorus in normal range ? ? ? ? ? LOS: 20 ?[place] ?3/9/20233:08 PM ? ?[facility] ?[place], [place] ?[phone]  ?

## 2022-01-15 DIAGNOSIS — F333 Major depressive disorder, recurrent, severe with psychotic symptoms: Secondary | ICD-10-CM | POA: Diagnosis not present

## 2022-01-15 MED ORDER — GABAPENTIN 300 MG PO CAPS
300.0000 mg | ORAL_CAPSULE | Freq: Three times a day (TID) | ORAL | Status: DC
  Administered 2022-01-15 – 2022-01-16 (×3): 300 mg via ORAL
  Filled 2022-01-15 (×3): qty 1

## 2022-01-15 NOTE — Progress Notes (Signed)
Recreation Therapy Notes ? ? ? ?Date: 01/15/2022 ? ?Time: 1:30 pm   ? ?Location: Craft room   ? ?Behavioral response: Appropriate ? ?Intervention Topic: Self-care   ? ?Discussion/Intervention:  ?Group content today was focused on Self-Care. The group defined self-care and some positive ways they care for themselves. Individuals expressed ways and reasons why they neglected any self-care in the past. Patients described ways to improve self-care in the future. The group explained what could happen if they did not do any self-care activities at all. The group participated in the intervention ?self-care assessment? where they had a chance to discover some of their weaknesses and strengths in self- care. Patient came up with a self-care plan to improve themselves in the future.  ?Clinical Observations/Feedback: ?Patient came to group was focused on what peers and staff had to say about self-care.  Individual was social with staff and peers while participating in the intervention.  ?[name] LRT/CTRS  ? ? ? ? ? ? ? ?[name] ?01/15/2022 4:04 PM ?

## 2022-01-15 NOTE — Progress Notes (Signed)
[facility] MD Progress Note  01/15/2022 10:32 AM [name]  MRN:  [id number] Subjective: [name] states that his mood is getting better.  He is complaining of leg pain that he relates to BUR[place].  He is taking Tylenol but is not helping.  He cannot have NSAIDs due to his cardiac history.  I discussed Neurontin with him which she has not been on and I told him it could also help with his anxiety.  He would like to give it a try.  He is tolerating his medications and denies any side effects.  No complaints today of not sleeping.  Principal Problem: Acute on chronic renal insufficiency Diagnosis: Principal Problem:   Acute on chronic renal insufficiency Active Problems:   Atrial fibrillation (HCC)   Chronic systolic CHF (congestive heart failure) (HCC)   History of liver transplant ([place])   History of kidney transplant   Chronic ITP (idiopathic thrombocytopenic purpura) (HCC)   MDD (major depressive disorder), recurrent episode ([place])  Total Time spent with patient: 15 minutes  Past Psychiatric History: [facility]  Past Medical History:  Past Medical History:  Diagnosis Date   Atrial fibrillation ([place]) 08/2009   CAD (coronary artery disease)    s/p CABG -- 1992   Depression    ESRD (end stage renal disease) ([place])    HLD (hyperlipidemia)    HTN (hypertension)    Idiopathic thrombocytopenia (HCC)     Past Surgical History:  Procedure Laterality Date   APPENDECTOMY     CORONARY ARTERY BYPASS GRAFT     FACIAL COSMETIC SURGERY     GALLBLADDER SURGERY     HERNIA REPAIR     KIDNEY TRANSPLANT     LEFT HEART CATH AND CORONARY ANGIOGRAPHY N/A 08/07/2018   Procedure: LEFT HEART CATH AND CORONARY ANGIOGRAPHY;  Surgeon: Sherren Mocha, MD;  Location: [facility];  Service: Cardiovascular;  Laterality: N/A;   LIVER TRANSPLANT     PRESSURE SENSOR/CARDIOMEMS N/A 08/04/2021   Procedure: PRESSURE SENSOR/CARDIOMEMS;  Surgeon: Larey Dresser, MD;  Location: [facility];  Service:  Cardiovascular;  Laterality: N/A;   RIGHT HEART CATH N/A 06/25/2021   Procedure: RIGHT HEART CATH;  Surgeon: Larey Dresser, MD;  Location: [facility];  Service: Cardiovascular;  Laterality: N/A;   Family History:  Family History  Problem Relation Age of Onset   Atrial fibrillation Mother    Breast cancer Mother    Renal cancer Father    Hypertension Brother    Hyperlipidemia Brother    Other Brother        stent    Renal Disease Maternal Grandmother    Stroke Maternal Grandmother    Heart attack Maternal Grandfather    Cancer Paternal Grandmother    Heart failure Paternal Grandfather    Emphysema Paternal Grandfather    Bronchiolitis Paternal Grandfather     Social History:  Social History   Substance and Sexual Activity  Alcohol Use No     Social History   Substance and Sexual Activity  Drug Use No    Social History   Socioeconomic History   Marital status: Single    Spouse name: Not on file   Number of children: 0   Years of education: Not on file   Highest education level: Not on file  Occupational History   Occupation: retired  Tobacco Use   Smoking status: Never   Smokeless tobacco: Never  Vaping Use   Vaping Use: Never used  Substance  and Sexual Activity   Alcohol use: No   Drug use: No   Sexual activity: Not on file  Other Topics Concern   Not on file  Social History Narrative   Not on file   Social Determinants of Health   Financial Resource Strain: Low Risk    Difficulty of Paying Living Expenses: Not very hard  Food Insecurity: No Food Insecurity   Worried About Running Out of Food in the Last Year: Never true   Ran Out of Food in the Last Year: Never true  Transportation Needs: No Transportation Needs   Lack of Transportation (Medical): No   Lack of Transportation (Non-Medical): No  Physical Activity: Not on file  Stress: Not on file  Social Connections: Not on file   Additional Social History:                          Sleep: Fair  Appetite:  Fair  Current Medications: Current Facility-Administered Medications  Medication Dose Route Frequency Provider Last Rate Last Admin   acetaminophen (TYLENOL) tablet 650 mg  650 mg Oral Q6H PRN Suella Broad, FNP   650 mg at 01/15/22 1026   alum & mag hydroxide-simeth (MAALOX/MYLANTA) 200-200-20 MG/5ML suspension 30 mL  30 mL Oral Q4H PRN Starkes-Perry, Gayland Curry, FNP       amiodarone (PACERONE) tablet 200 mg  200 mg Oral Daily Starkes-Perry, Takia S, FNP   200 mg at 01/15/22 0900   atorvastatin (LIPITOR) tablet 20 mg  20 mg Oral QHS Suella Broad, FNP   20 mg at 01/14/22 2203   gabapentin (NEURONTIN) capsule 300 mg  300 mg Oral TID Parks Ranger, DO       LORazepam (ATIVAN) tablet 2 mg  2 mg Oral QHS PRN Parks Ranger, DO   2 mg at 01/14/22 2345   magnesium hydroxide (MILK OF MAGNESIA) suspension 30 mL  30 mL Oral Daily PRN Suella Broad, FNP       magnesium oxide (MAG-OX) tablet 400 mg  400 mg Oral BID Suella Broad, FNP   400 mg at 01/15/22 0900   melatonin tablet 10 mg  10 mg Oral QHS Parks Ranger, DO   10 mg at 01/14/22 2202   OLANZapine (ZYPREXA) tablet 15 mg  15 mg Oral QHS Parks Ranger, DO   15 mg at 01/14/22 2202   ondansetron (ZOFRAN) tablet 4 mg  4 mg Oral Q8H PRN He, [name], MD   4 mg at 12/27/21 0848   risperiDONE (RISPERDAL) tablet 0.5 mg  0.5 mg Oral BH-q8a4p Parks Ranger, DO   0.5 mg at 01/15/22 0900   tacrolimus (PROGRAF) capsule 0.5 mg  0.5 mg Oral [name], FNP   0.5 mg at 01/15/22 0901   tacrolimus (PROGRAF) capsule 1 mg  1 mg Oral [name], FNP   1 mg at 01/14/22 0900   venlafaxine XR (EFFEXOR-XR) 24 hr capsule 150 mg  150 mg Oral Q breakfast Parks Ranger, DO   150 mg at 01/15/22 0900   warfarin (COUMADIN) tablet 2 mg  2 mg Oral Once per day on Sun Tue Wed Thu Fri Sat Parks Ranger, DO   2 mg at  01/14/22 1800   warfarin (COUMADIN) tablet 4 mg  4 mg Oral Q Mon-1800 Suella Broad, FNP   4 mg at 01/11/22 1705   Warfarin - Physician Dosing Inpatient  Does not apply q1600 [name], [facility]   Given at 01/14/22 1656    Lab Results:  Results for orders placed or performed during the hospital encounter of 12/25/21 (from the past 48 hour(s))  Renal function panel     Status: Abnormal   Collection Time: 01/13/22  7:51 PM  Result Value Ref Range   Sodium 131 (L) 135 - 145 mmol/L   Potassium 4.7 3.5 - 5.1 mmol/L   Chloride 98 98 - 111 mmol/L   CO2 26 22 - 32 mmol/L   Glucose, Bld 109 (H) 70 - 99 mg/dL    Comment: Glucose reference range applies only to samples taken after fasting for at least 8 hours.   BUN 27 (H) 8 - 23 mg/dL   Creatinine, Ser 2.38 (H) 0.61 - 1.24 mg/dL   Calcium 9.2 8.9 - 10.3 mg/dL   Phosphorus 2.7 2.5 - 4.6 mg/dL   Albumin 3.1 (L) 3.5 - 5.0 g/dL   GFR, Estimated 29 (L) >60 mL/min    Comment: (NOTE) Calculated using the CKD-EPI Creatinine Equation (2021)    Anion gap 7 5 - 15    Comment: Performed at [facility], [address] [place], [place] [address]  Protime-INR     Status: Abnormal   Collection Time: 01/14/22  6:26 AM  Result Value Ref Range   Prothrombin Time 22.4 (H) 11.4 - 15.2 seconds   INR 2.0 (H) 0.8 - 1.2    Comment: (NOTE) INR goal varies based on device and disease states. Performed at [facility], [address] [place], [place] [address]     Blood Alcohol level:  Lab Results  Component Value Date   [facility] <10 12/24/2021   ETH <10 81/15/7262    Metabolic Disorder Labs: Lab Results  Component Value Date   HGBA1C 5.2 11/17/2021   MPG 102.54 11/17/2021   MPG 105.41 08/05/2018   No results found for: PROLACTIN Lab Results  Component Value Date   CHOL 117 11/17/2021   TRIG 96 11/17/2021   HDL 28 (L) 11/17/2021   CHOLHDL 4.2 11/17/2021   VLDL 19 11/17/2021   LDLCALC 70 11/17/2021   LDLCALC 87  08/22/2019    Physical Findings: AIMS:  , ,  ,  ,    CIWA:    COWS:     Musculoskeletal: Strength & Muscle Tone: within normal limits Gait & Station: normal Patient leans: N/A  Psychiatric Specialty Exam:  Presentation  General Appearance: No data recorded Eye Contact:No data recorded Speech:No data recorded Speech Volume:No data recorded Handedness:No data recorded  Mood and Affect  Mood:No data recorded Affect:No data recorded  Thought Process  Thought Processes:No data recorded Descriptions of Associations:No data recorded Orientation:No data recorded Thought Content:No data recorded History of Schizophrenia/Schizoaffective disorder:No  Duration of Psychotic Symptoms:No data recorded Hallucinations:No data recorded Ideas of Reference:No data recorded Suicidal Thoughts:No data recorded Homicidal Thoughts:No data recorded  Sensorium  Memory:No data recorded Judgment:No data recorded Insight:No data recorded  Executive Functions  Concentration:No data recorded Attention Span:No data recorded Recall:No data recorded Fund of Knowledge:No data recorded Language:No data recorded  Psychomotor Activity  Psychomotor Activity:No data recorded  Assets  Assets:No data recorded  Sleep  Sleep:No data recorded   Physical Exam: Physical Exam Vitals and nursing note reviewed.  Constitutional:      Appearance: Normal appearance. He is normal weight.  Neurological:     General: No focal deficit present.     Mental Status: He is alert  and oriented to person, place, and time.  Psychiatric:        Attention and Perception: Attention and perception normal.        Mood and Affect: Mood is anxious and depressed. Affect is flat.        Speech: Speech normal.        Behavior: Behavior normal. Behavior is cooperative.        Thought Content: Thought content normal.        Cognition and Memory: Cognition and memory normal.        Judgment: Judgment normal.   Review  of Systems  Musculoskeletal:  Positive for joint pain.  Psychiatric/Behavioral:  Positive for depression. The patient is nervous/anxious and has insomnia.   Blood pressure 108/61, pulse 77, temperature 97.6 F (36.4 C), temperature source Oral, resp. rate 18, height '5\' 10"'$  (1.778 m), weight 81.2 kg, SpO2 97 %. Body mass index is 25.69 kg/m.   Treatment Plan Summary: Daily contact with patient to assess and evaluate symptoms and progress in treatment, Medication management, and Plan start Neurontin 300 mg 3 times a day for anxiety and pain.  Parks Ranger, DO 01/15/2022, 10:32 AM

## 2022-01-15 NOTE — Progress Notes (Signed)
Patient was cooperative with treatment on shift, he remains sad and depressed. He denies SI, HI & AVH. He got up one time during the night for his Ativan and he returned back to bed. Patient had no new behavioral issues to report on shift at this time. ?

## 2022-01-15 NOTE — Plan of Care (Signed)
?  Problem: Education: ?Goal: Knowledge of [facility] Education information/materials will improve ?Outcome: Progressing ?Goal: Verbalization of understanding the information provided will improve ?Outcome: Progressing ?  ?Problem: Activity: ?Goal: Sleeping patterns will improve ?Outcome: Progressing ?  ?Problem: Health Behavior/Discharge Planning: ?Goal: Compliance with treatment plan for underlying cause of condition will improve ?Outcome: Progressing ?  ?Problem: Physical Regulation: ?Goal: Ability to maintain clinical measurements within normal limits will improve ?Outcome: Progressing ?  ?

## 2022-01-15 NOTE — [facility] Group Notes (Signed)
[facility] Group Notes:  (Nursing/MHT/Case Management/Adjunct) ? ?Date:  01/15/2022  ?Time:  10:30 AM ? ?Type of Therapy:  Group Therapy ? ?Participation Level:  Minimal ? ?Participation Quality:  Inattentive ? ?Affect:  Flat ? ?Cognitive:  Lacking ? ?Insight:  None ? ?Engagement in Group:  None ? ?Modes of Intervention:  Discussion ? ?Summary of Progress/Problems: ?Pt. Came to group and followed along with handouts but did not contribute to discussion. ?Juliette Alcide ?01/15/2022, 12:15 PM ?

## 2022-01-15 NOTE — Progress Notes (Signed)
[facility] ?[place], [place] ?01/15/22 ? ?Subjective:  ? ?LOS: 21 ?No intake/output data recorded. ? ?Patient seen during group session ?Alert and oriented ?Ambulating with walker ?Tolerating meals ?Mild lower extremity edema remains ? ?Objective:  ?Vital signs in last 24 hours:  ?Temp:  [97.6 ?F (36.4 ?C)-98 ?F (36.7 ?C)] 97.6 ?F (36.4 ?C) (03/10 0710) ?Pulse Rate:  [64-77] 77 (03/10 0710) ?Resp:  [18] 18 (03/10 0710) ?BP: (105-130)/(61-74) 108/61 (03/10 0710) ?SpO2:  [97 %-99 %] 97 % (03/10 0710) ? ?Weight change:  ?Filed Weights  ? 12/25/21 2145  ?Weight: 81.2 kg  ? ? ?Intake/Output: ?  ? ?Intake/Output Summary (Last 24 hours) at 01/15/2022 1352 ?Last data filed at 01/15/2022 1200 ?Gross per 24 hour  ?Intake 590 ml  ?Output --  ?Net 590 ml  ? ? ?Physical Exam: ?General:  No acute distress  ?HEENT  anicteric, moist oral mucous membrane  ?Pulm/lungs  normal breathing effort, lungs are clear to auscultation  ?CVS/Heart  regular rhythm, no rub or gallop  ?Abdomen:   Soft, nontender  ?Extremities:  + peripheral edema  ?Neurologic:  Alert, oriented, able to follow commands  ?Skin:  No acute rashes  ? ?Basic Metabolic Panel: ? ?Recent Labs  ?Lab 01/09/22 ?0277 01/10/22 ?0902 01/11/22 ?4128 01/13/22 ?1949  ?NA 135 132* 131* 131*  ?K 4.6 4.3 4.4 4.7  ?CL 100 98 98 98  ?CO2 '29 26 29 26  '$ ?GLUCOSE 95 129* 92 109*  ?BUN '22 21 22 '$ 27*  ?CREATININE 2.88* 2.75* 2.60* 2.38*  ?CALCIUM 9.2 9.2 9.2 9.2  ?PHOS 3.3 2.7  --  2.7  ? ? ? ? ?CBC: ?Recent Labs  ?Lab 01/10/22 ?0902  ?WBC 3.3*  ?HGB 13.8  ?HCT 42.1  ?MCV 83.0  ?PLT 90*  ? ? ? ? No results found for: HEPBSAG, HEPBSAB, HEPBIGM ? ? ? ?Microbiology: ? ?No results found for this or any previous visit (from the past 240 hour(s)). ? ?Coagulation Studies: ?Recent Labs  ?  01/14/22 ?0626  ?LABPROT 22.4*  ?INR 2.0*  ? ? ? ?Urinalysis: ?No results for input(s): COLORURINE, LABSPEC, [place], GLUCOSEU, HGBUR, BILIRUBINUR, KETONESUR, PROTEINUR, UROBILINOGEN, NITRITE,  LEUKOCYTESUR in the last 72 hours. ? ?Invalid input(s): APPERANCEUR ?  ? ? ?Imaging: ?No results found. ? ? ?Medications:  ? ? ? amiodarone  200 mg Oral Daily  ? atorvastatin  20 mg Oral QHS  ? gabapentin  300 mg Oral TID  ? magnesium oxide  400 mg Oral BID  ? melatonin  10 mg Oral QHS  ? OLANZapine  15 mg Oral QHS  ? risperiDONE  0.5 mg Oral BH-q8a4p  ? tacrolimus  0.5 mg Oral QODAY  ? tacrolimus  1 mg Oral QODAY  ? venlafaxine XR  150 mg Oral Q breakfast  ? warfarin  2 mg Oral Once per day on Sun Tue Wed Thu Fri Sat  ? warfarin  4 mg Oral Q Mon-1800  ? Warfarin - Physician Dosing Inpatient   Does not apply q1600  ? ?acetaminophen, alum & mag hydroxide-simeth, LORazepam, magnesium hydroxide, ondansetron ? ?Assessment/ Plan:  ?71 y.o. 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 ?Principal Problem: ?  Acute on chronic renal insufficiency ?Active Problems: ?  Atrial fibrillation ([place]) ?  Chronic systolic CHF (congestive heart  failure) ([place]) ?  History of liver transplant ([place]) ?  History of kidney transplant ?  Chronic ITP (idiopathic thrombocytopenic purpura) (HCC) ?  MDD (major depressive disorder), recurrent episode ([place]) ? ? ?#.  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  01/09/22 ?0640 01/10/22 ?0902 01/11/22 ?0613 01/13/22 ?1949  ?CREATININE 2.88* 2.75* 2.60* 2.38*  ? ?Outpatient transplant medication outpatient transplant immunosuppression regimen includes tacrolimus 0.5 mg twice daily alternating with 1 mg daily and 5 mg of prednisone daily.  Was  requiring midodrine 5 mg 3 times daily for hypotension. ?Goal for Tacrolimus trough 2-4 ?Tac level 5.4 on 01/09/22.  Diuretics held ? Renal function stable. Will obtain updated labs on Sunday. Will continue to monitor but may not visit daily ? ?#. Anemia assessment ? ?Lab Results  ?Component Value Date  ? HGB 13.8 01/10/2022  ? ?Hemoglobin appropriate ? ?#.  Bone and mineral metabolism assessment ? ?No results found for: PTH ?Lab Results  ?Component Value Date  ? PHOS 2.7 01/13/2022  ?Phosphorus in normal range ? ? ? LOS: 21 ?[place] ?3/10/20231:52 PM ? ?[facility] ?[place], [place] ?[phone]  ?

## 2022-01-15 NOTE — Progress Notes (Signed)
Patient presents A&Ox4 with sad and depressed affect.  Reports sleeping fair, appetite good and energy level poor.  Denies AVH, SI or HI.  Reports chronic right hip pain 7/10 with relief reported after prn Tylenol.  Patient endorses anxiety and depression rating both 4/10.  Pt compliant with all meds.  Patient spent most of shift lying in bed.  Denies needs or complaints.  Will continue with Q15 min safety checks. ? ?

## 2022-01-16 DIAGNOSIS — N289 Disorder of kidney and ureter, unspecified: Secondary | ICD-10-CM

## 2022-01-16 DIAGNOSIS — N189 Chronic kidney disease, unspecified: Secondary | ICD-10-CM

## 2022-01-16 NOTE — Progress Notes (Signed)
Patient presents as A&O x4. He denies anx/dep., SI, HI, and AVH. Pt reports pain felt in his right hip when laying on it. He rated his pain a 7/10. PRN Tylenol 650 mg given as per MAR orders with positive effect. Pt continues to c/o trouble falling asleep. PRN Ativan 2 mg given as per MAR orders with positive effect. Pt stated his hip pain is chronic and has been there for 5 years yet it gets worse when laying on it. Pt remains safe on the unit at this time. Q 15 min safety checks in place.  ?

## 2022-01-16 NOTE — Progress Notes (Signed)
Patient decided to eat his dinner tray. He at 100%. ?

## 2022-01-16 NOTE — Progress Notes (Signed)
Patient is A & O x4.  He remained in his room asleep until 3:30pm.  He stated he could not wake up, he was tired.  Pt denies anxiety/depression, SI, HI, and AVH.  He did not eat his food from tray today.  He ate 2 pack of crackers.  He did not wish to eat today.  He came to day room around 4:45pm until 5:30pm.  Pt remains safe on the unit at this time.  Q 15 min safety checks were in place.  ?

## 2022-01-16 NOTE — Group Note (Signed)
LCSW Group Therapy Note ? ?Group Date: 01/16/2022 ?Start Time: 1315 ?End Time: 0964 ? ? ?Type of Therapy and Topic:  Group Therapy - Healthy vs Unhealthy Coping Skills ? ?Participation Level:  Did Not Attend  ? ?Description of Group ?The focus of this group was to determine what unhealthy coping techniques typically are used by group members and what healthy coping techniques would be helpful in coping with various problems. Patients were guided in becoming aware of the differences between healthy and unhealthy coping techniques. Patients were asked to identify 2-3 healthy coping skills they would like to learn to use more effectively. ? ?Therapeutic Goals ?Patients learned that coping is what human beings do all day long to deal with various situations in their lives ?Patients defined and discussed healthy vs unhealthy coping techniques ?Patients identified their preferred coping techniques and identified whether these were healthy or unhealthy ?Patients determined 2-3 healthy coping skills they would like to become more familiar with and use more often. ?Patients provided support and ideas to each other ? ? ?Summary of Patient Progress:  Due to limited staffing, group was not held on the unit.  ? ? ?Therapeutic Modalities ?Cognitive Behavioral Therapy ?Motivational Interviewing ? ?Kenna Gilbert Iyauna Sing, LCSWA ?01/16/2022  5:28 PM   ?

## 2022-01-16 NOTE — Progress Notes (Signed)
[facility] MD Progress Note  01/16/2022 1:03 PM [name]  MRN:  [id number] Subjective: Follow-up 71 year old man with depression multiple medical problems.  Nursing staff report that the patient seems very different today.  He is sedated and is complaining of feeling "swimmy headed".  Patient looks tired with his head down.  He was awake but a little sluggish.  Told me that he had felt fine when he went to sleep but when he woke up this morning he was not feeling right.  Feels dizzy.  Vitals checked this morning were unremarkable.  Labs are scheduled to be checked tomorrow.  Only new medication had been beginning the gabapentin yesterday Principal Problem: Acute on chronic renal insufficiency Diagnosis: Principal Problem:   Acute on chronic renal insufficiency Active Problems:   Atrial fibrillation (HCC)   Chronic systolic CHF (congestive heart failure) (HCC)   History of liver transplant ([place])   History of kidney transplant   Chronic ITP (idiopathic thrombocytopenic purpura) (HCC)   MDD (major depressive disorder), recurrent episode ([place])  Total Time spent with patient: 30 minutes  Past Psychiatric History: History of depression  Past Medical History:  Past Medical History:  Diagnosis Date   Atrial fibrillation ([place]) 08/2009   CAD (coronary artery disease)    s/p CABG -- 1992   Depression    ESRD (end stage renal disease) ([place])    HLD (hyperlipidemia)    HTN (hypertension)    Idiopathic thrombocytopenia (HCC)     Past Surgical History:  Procedure Laterality Date   APPENDECTOMY     CORONARY ARTERY BYPASS GRAFT     FACIAL COSMETIC SURGERY     GALLBLADDER SURGERY     HERNIA REPAIR     KIDNEY TRANSPLANT     LEFT HEART CATH AND CORONARY ANGIOGRAPHY N/A 08/07/2018   Procedure: LEFT HEART CATH AND CORONARY ANGIOGRAPHY;  Surgeon: Sherren Mocha, MD;  Location: [place] [facility];  Service: Cardiovascular;  Laterality: N/A;   LIVER TRANSPLANT     PRESSURE SENSOR/CARDIOMEMS N/A  08/04/2021   Procedure: PRESSURE SENSOR/CARDIOMEMS;  Surgeon: Larey Dresser, MD;  Location: [place] [facility];  Service: Cardiovascular;  Laterality: N/A;   RIGHT HEART CATH N/A 06/25/2021   Procedure: RIGHT HEART CATH;  Surgeon: Larey Dresser, MD;  Location: [place] [facility];  Service: Cardiovascular;  Laterality: N/A;   Family History:  Family History  Problem Relation Age of Onset   Atrial fibrillation Mother    Breast cancer Mother    Renal cancer Father    Hypertension Brother    Hyperlipidemia Brother    Other Brother        stent    Renal Disease Maternal Grandmother    Stroke Maternal Grandmother    Heart attack Maternal Grandfather    Cancer Paternal Grandmother    Heart failure Paternal Grandfather    Emphysema Paternal Grandfather    Bronchiolitis Paternal Grandfather    Family Psychiatric  History: See previous Social History:  Social History   Substance and Sexual Activity  Alcohol Use No     Social History   Substance and Sexual Activity  Drug Use No    Social History   Socioeconomic History   Marital status: Single    Spouse name: Not on file   Number of children: 0   Years of education: Not on file   Highest education level: Not on file  Occupational History   Occupation: retired  Tobacco Use   Smoking status: Never  Smokeless tobacco: Never  Vaping Use   Vaping Use: Never used  Substance and Sexual Activity   Alcohol use: No   Drug use: No   Sexual activity: Not on file  Other Topics Concern   Not on file  Social History Narrative   Not on file   Social Determinants of Health   Financial Resource Strain: Low Risk    Difficulty of Paying Living Expenses: Not very hard  Food Insecurity: No Food Insecurity   Worried About Running Out of Food in the Last Year: Never true   Ran Out of Food in the Last Year: Never true  Transportation Needs: No Transportation Needs   Lack of Transportation (Medical): No   Lack of Transportation  (Non-Medical): No  Physical Activity: Not on file  Stress: Not on file  Social Connections: Not on file   Additional Social History:                         Sleep: Fair  Appetite:  Poor  Current Medications: Current Facility-Administered Medications  Medication Dose Route Frequency Provider Last Rate Last Admin   acetaminophen (TYLENOL) tablet 650 mg  650 mg Oral Q6H PRN Suella Broad, FNP   650 mg at 01/16/22 0004   alum & mag hydroxide-simeth (MAALOX/MYLANTA) 200-200-20 MG/5ML suspension 30 mL  30 mL Oral Q4H PRN Starkes-Perry, Gayland Curry, FNP       amiodarone (PACERONE) tablet 200 mg  200 mg Oral Daily Suella Broad, FNP   200 mg at 01/16/22 4010   atorvastatin (LIPITOR) tablet 20 mg  20 mg Oral QHS Suella Broad, FNP   20 mg at 01/15/22 2202   LORazepam (ATIVAN) tablet 2 mg  2 mg Oral QHS PRN Parks Ranger, DO   2 mg at 01/16/22 0001   magnesium hydroxide (MILK OF MAGNESIA) suspension 30 mL  30 mL Oral Daily PRN Suella Broad, FNP       magnesium oxide (MAG-OX) tablet 400 mg  400 mg Oral BID Suella Broad, FNP   400 mg at 01/16/22 2725   melatonin tablet 10 mg  10 mg Oral QHS Parks Ranger, DO   10 mg at 01/15/22 2202   OLANZapine (ZYPREXA) tablet 15 mg  15 mg Oral QHS Parks Ranger, DO   15 mg at 01/15/22 2202   ondansetron (ZOFRAN) tablet 4 mg  4 mg Oral Q8H PRN He, [name], MD   4 mg at 12/27/21 0848   risperiDONE (RISPERDAL) tablet 0.5 mg  0.5 mg Oral BH-q8a4p Parks Ranger, DO   0.5 mg at 01/16/22 0751   tacrolimus (PROGRAF) capsule 0.5 mg  0.5 mg Oral [name], FNP   0.5 mg at 01/15/22 0901   tacrolimus (PROGRAF) capsule 1 mg  1 mg Oral [name], FNP   1 mg at 01/16/22 0932   venlafaxine XR (EFFEXOR-XR) 24 hr capsule 150 mg  150 mg Oral Q breakfast Parks Ranger, DO   150 mg at 01/16/22 0750   warfarin (COUMADIN) tablet 2 mg  2 mg Oral Once  per day on Sun Tue Wed Thu Fri Sat Parks Ranger, DO   2 mg at 01/15/22 1701   warfarin (COUMADIN) tablet 4 mg  4 mg Oral Q Mon-1800 Suella Broad, FNP   4 mg at 01/11/22 1705   Warfarin - Physician Dosing Inpatient   Does not apply q1600 Beers,  Shanon Brow, [facility]   Given at 01/15/22 1600    Lab Results: No results found for this or any previous visit (from the past 85 hour(s)).  Blood Alcohol level:  Lab Results  Component Value Date   ETH <10 12/24/2021   ETH <10 35/57/3220    Metabolic Disorder Labs: Lab Results  Component Value Date   HGBA1C 5.2 11/17/2021   MPG 102.54 11/17/2021   MPG 105.41 08/05/2018   No results found for: PROLACTIN Lab Results  Component Value Date   CHOL 117 11/17/2021   TRIG 96 11/17/2021   HDL 28 (L) 11/17/2021   CHOLHDL 4.2 11/17/2021   VLDL 19 11/17/2021   LDLCALC 70 11/17/2021   LDLCALC 87 08/22/2019    Physical Findings: AIMS:  , ,  ,  ,    CIWA:    COWS:     Musculoskeletal: Strength & Muscle Tone: decreased Gait & Station: unsteady Patient leans: Front  Psychiatric Specialty Exam:  Presentation  General Appearance: No data recorded Eye Contact:No data recorded Speech:No data recorded Speech Volume:No data recorded Handedness:No data recorded  Mood and Affect  Mood:No data recorded Affect:No data recorded  Thought Process  Thought Processes:No data recorded Descriptions of Associations:No data recorded Orientation:No data recorded Thought Content:No data recorded History of Schizophrenia/Schizoaffective disorder:No  Duration of Psychotic Symptoms:No data recorded Hallucinations:No data recorded Ideas of Reference:No data recorded Suicidal Thoughts:No data recorded Homicidal Thoughts:No data recorded  Sensorium  Memory:No data recorded Judgment:No data recorded Insight:No data recorded  Executive Functions  Concentration:No data recorded Attention Span:No data recorded Recall:No data  recorded Fund of Knowledge:No data recorded Language:No data recorded  Psychomotor Activity  Psychomotor Activity:No data recorded  Assets  Assets:No data recorded  Sleep  Sleep:No data recorded   Physical Exam: Physical Exam Vitals and nursing note reviewed.  Constitutional:      Appearance: Normal appearance. He is ill-appearing.  HENT:     Head: Normocephalic and atraumatic.     Mouth/Throat:     Pharynx: Oropharynx is clear.  Eyes:     Pupils: Pupils are equal, round, and reactive to light.  Cardiovascular:     Rate and Rhythm: Normal rate and regular rhythm.  Pulmonary:     Effort: Pulmonary effort is normal.     Breath sounds: Normal breath sounds.  Abdominal:     General: Abdomen is flat.     Palpations: Abdomen is soft.  Musculoskeletal:        General: Normal range of motion.  Skin:    General: Skin is warm and dry.  Neurological:     General: No focal deficit present.     Mental Status: He is alert. Mental status is at baseline.  Psychiatric:        Attention and Perception: He is inattentive.        Mood and Affect: Mood normal. Affect is blunt.        Speech: Speech is delayed. Speech is not tangential.        Behavior: Behavior is slowed.        Thought Content: Thought content normal.        Cognition and Memory: Cognition is impaired.   Review of Systems  Constitutional:  Positive for malaise/fatigue.  HENT: Negative.    Eyes: Negative.   Respiratory: Negative.    Cardiovascular: Negative.   Gastrointestinal: Negative.   Musculoskeletal: Negative.   Skin: Negative.   Neurological:  Positive for dizziness.  Psychiatric/Behavioral:  Positive for depression.  Negative for substance abuse and suicidal ideas. The patient is nervous/anxious.   Blood pressure 115/73, pulse 74, temperature (!) 97.3 F (36.3 C), temperature source Oral, resp. rate 18, height '5\' 10"'$  (1.778 m), weight 81.2 kg, SpO2 97 %. Body mass index is 25.69 kg/m.   Treatment  Plan Summary: Medication management and Plan medicines reviewed.  The only new medication was starting the gabapentin.  I will discontinue that and we will see if he improves.  Labs including renal panel and blood counts are scheduled to be drawn tomorrow morning so I will not duplicate that tonight.  Nursing is aware that he is more unsteady on his feet and will be keeping a close eye on him.  Alethia Berthold, MD 01/16/2022, 1:03 PM

## 2022-01-17 DIAGNOSIS — N189 Chronic kidney disease, unspecified: Secondary | ICD-10-CM | POA: Diagnosis not present

## 2022-01-17 DIAGNOSIS — N289 Disorder of kidney and ureter, unspecified: Secondary | ICD-10-CM | POA: Diagnosis not present

## 2022-01-17 LAB — PROTIME-INR
INR: 2.1 — ABNORMAL HIGH (ref 0.8–1.2)
Prothrombin Time: 23.7 seconds — ABNORMAL HIGH (ref 11.4–15.2)

## 2022-01-17 LAB — RENAL FUNCTION PANEL
Albumin: 3 g/dL — ABNORMAL LOW (ref 3.5–5.0)
Anion gap: 5 (ref 5–15)
BUN: 27 mg/dL — ABNORMAL HIGH (ref 8–23)
CO2: 25 mmol/L (ref 22–32)
Calcium: 8.8 mg/dL — ABNORMAL LOW (ref 8.9–10.3)
Chloride: 100 mmol/L (ref 98–111)
Creatinine, Ser: 2.32 mg/dL — ABNORMAL HIGH (ref 0.61–1.24)
GFR, Estimated: 30 mL/min — ABNORMAL LOW (ref >60)
Glucose, Bld: 106 mg/dL — ABNORMAL HIGH (ref 70–99)
Phosphorus: 3.1 mg/dL (ref 2.5–4.6)
Potassium: 4.4 mmol/L (ref 3.5–5.1)
Sodium: 130 mmol/L — ABNORMAL LOW (ref 135–145)

## 2022-01-17 LAB — CBC
HCT: 38.9 % — ABNORMAL LOW (ref 39.0–52.0)
Hemoglobin: 12.7 g/dL — ABNORMAL LOW (ref 13.0–17.0)
MCH: 27.1 pg (ref 26.0–34.0)
MCHC: 32.6 g/dL (ref 30.0–36.0)
MCV: 83.1 fL (ref 80.0–100.0)
Platelets: 96 10*3/uL — ABNORMAL LOW (ref 150–400)
RBC: 4.68 MIL/uL (ref 4.22–5.81)
RDW: 15.6 % — ABNORMAL HIGH (ref 11.5–15.5)
WBC: 3.7 10*3/uL — ABNORMAL LOW (ref 4.0–10.5)
nRBC: 0 % (ref 0.0–0.2)

## 2022-01-17 LAB — POCT INR: INR: 2.1 (ref 2.0–3.0)

## 2022-01-17 NOTE — Group Note (Signed)
[facility] LCSW Group Therapy Note ? ? ?Group Date: 01/17/2022 ?Start Time: 1315 ?End Time: 4854 ? ? ?Type of Therapy/Topic:  Group Therapy:  Emotion Regulation ? ?Participation Level:  Minimal  ? ?Mood: ? ?Description of Group:   ? The purpose of this group is to assist patients in learning to regulate negative emotions and experience positive emotions. Patients will be guided to discuss ways in which they have been vulnerable to their negative emotions. These vulnerabilities will be juxtaposed with experiences of positive emotions or situations, and patients challenged to use positive emotions to combat negative ones. Special emphasis will be placed on coping with negative emotions in conflict situations, and patients will process healthy conflict resolution skills. ? ?Therapeutic Goals: ?Patient will identify two positive emotions or experiences to reflect on in order to balance out negative emotions:  ?Patient will label two or more emotions that they find the most difficult to experience:  ?Patient will be able to demonstrate positive conflict resolution skills through discussion or role plays:  ? ?Summary of Patient Progress: Patient presented to group on time and was present for the duration of the session. Patient presented with a depressed mood and affect, describing his mood as "gloomy". Patient complained of not being able to sleep. Patient identified deep breathing as an exercise that helps him manage feelings of worry. Patient required prompting to contribute to group discussion but appeared receptive to information dicussed on supplemental handout. ? ? ? ? ? ?Therapeutic Modalities:   ?Cognitive Behavioral Therapy ?Feelings Identification ?Dialectical Behavioral Therapy ? ? ?[name], LCSWA ?

## 2022-01-17 NOTE — Progress Notes (Signed)
Pt is seen in the dayroom in reclining wheelchair. Pt appears more sullen than usual. He denies pain, SI, HI, and AVH. Pt c/o feeling weak and BP was low. Pt given Gatorade and encouraged to drink more fluids. Pt c/o feeling anxious about sleep disturbances. Pt remains safe on the unit at this time. Q 15 min safety checks in place.  ?

## 2022-01-17 NOTE — Progress Notes (Signed)
[facility] MD Progress Note  01/17/2022 3:34 PM [name]  MRN:  [id number] Subjective: Follow-up for this 71 year old man with depression.  Patient says today he is feeling less sedated than he was yesterday.  In the morning he got up and felt like he was still having trouble speaking clearly but that seems to have gotten better.  Still depressed and anxious but no active suicidal ideation.  Spoke with me for a while about how he has realized recently that a lot of his unhappiness comes from the fact that he is gay and has never faced that in the past. Principal Problem: Acute on chronic renal insufficiency Diagnosis: Principal Problem:   Acute on chronic renal insufficiency Active Problems:   Atrial fibrillation (HCC)   Chronic systolic CHF (congestive heart failure) ([place])   History of liver transplant ([place])   History of kidney transplant   Chronic ITP (idiopathic thrombocytopenic purpura) (HCC)   MDD (major depressive disorder), recurrent episode ([place])  Total Time spent with patient: 30 minutes  Past Psychiatric History: Past history of depression see previous  Past Medical History:  Past Medical History:  Diagnosis Date   Atrial fibrillation ([place]) 08/2009   CAD (coronary artery disease)    s/p CABG -- 1992   Depression    ESRD (end stage renal disease) ([place])    HLD (hyperlipidemia)    HTN (hypertension)    Idiopathic thrombocytopenia ([place])     Past Surgical History:  Procedure Laterality Date   APPENDECTOMY     CORONARY ARTERY BYPASS GRAFT     FACIAL COSMETIC SURGERY     GALLBLADDER SURGERY     HERNIA REPAIR     KIDNEY TRANSPLANT     LEFT HEART CATH AND CORONARY ANGIOGRAPHY N/A 08/07/2018   Procedure: LEFT HEART CATH AND CORONARY ANGIOGRAPHY;  Surgeon: Sherren Mocha, MD;  Location: [place] [facility];  Service: Cardiovascular;  Laterality: N/A;   LIVER TRANSPLANT     PRESSURE SENSOR/CARDIOMEMS N/A 08/04/2021   Procedure: PRESSURE SENSOR/CARDIOMEMS;  Surgeon: Larey Dresser, MD;  Location: [place] [facility];  Service: Cardiovascular;  Laterality: N/A;   RIGHT HEART CATH N/A 06/25/2021   Procedure: RIGHT HEART CATH;  Surgeon: Larey Dresser, MD;  Location: [place] [facility];  Service: Cardiovascular;  Laterality: N/A;   Family History:  Family History  Problem Relation Age of Onset   Atrial fibrillation Mother    Breast cancer Mother    Renal cancer Father    Hypertension Brother    Hyperlipidemia Brother    Other Brother        stent    Renal Disease Maternal Grandmother    Stroke Maternal Grandmother    Heart attack Maternal Grandfather    Cancer Paternal Grandmother    Heart failure Paternal Grandfather    Emphysema Paternal Grandfather    Bronchiolitis Paternal Grandfather    Family Psychiatric  History: None reported Social History:  Social History   Substance and Sexual Activity  Alcohol Use No     Social History   Substance and Sexual Activity  Drug Use No    Social History   Socioeconomic History   Marital status: Single    Spouse name: Not on file   Number of children: 0   Years of education: Not on file   Highest education level: Not on file  Occupational History   Occupation: retired  Tobacco Use   Smoking status: Never   Smokeless tobacco: Never  Vaping  Use   Vaping Use: Never used  Substance and Sexual Activity   Alcohol use: No   Drug use: No   Sexual activity: Not on file  Other Topics Concern   Not on file  Social History Narrative   Not on file   Social Determinants of Health   Financial Resource Strain: Low Risk    Difficulty of Paying Living Expenses: Not very hard  Food Insecurity: No Food Insecurity   Worried About Running Out of Food in the Last Year: Never true   Ran Out of Food in the Last Year: Never true  Transportation Needs: No Transportation Needs   Lack of Transportation (Medical): No   Lack of Transportation (Non-Medical): No  Physical Activity: Not on file  Stress: Not on  file  Social Connections: Not on file   Additional Social History:                         Sleep: Fair  Appetite:  Fair  Current Medications: Current Facility-Administered Medications  Medication Dose Route Frequency Provider Last Rate Last Admin   acetaminophen (TYLENOL) tablet 650 mg  650 mg Oral Q6H PRN Suella Broad, FNP   650 mg at 01/16/22 2205   alum & mag hydroxide-simeth (MAALOX/MYLANTA) 200-200-20 MG/5ML suspension 30 mL  30 mL Oral Q4H PRN Starkes-Perry, Gayland Curry, FNP       amiodarone (PACERONE) tablet 200 mg  200 mg Oral Daily Suella Broad, FNP   200 mg at 01/17/22 0943   atorvastatin (LIPITOR) tablet 20 mg  20 mg Oral QHS Suella Broad, FNP   20 mg at 01/16/22 2205   LORazepam (ATIVAN) tablet 2 mg  2 mg Oral QHS PRN Parks Ranger, DO   2 mg at 01/16/22 2331   magnesium hydroxide (MILK OF MAGNESIA) suspension 30 mL  30 mL Oral Daily PRN Suella Broad, FNP       magnesium oxide (MAG-OX) tablet 400 mg  400 mg Oral BID Suella Broad, FNP   400 mg at 01/17/22 6433   melatonin tablet 10 mg  10 mg Oral QHS Parks Ranger, DO   10 mg at 01/16/22 2205   OLANZapine (ZYPREXA) tablet 15 mg  15 mg Oral QHS Parks Ranger, DO   15 mg at 01/16/22 2205   ondansetron (ZOFRAN) tablet 4 mg  4 mg Oral Q8H PRN He, [name], MD   4 mg at 12/27/21 0848   risperiDONE (RISPERDAL) tablet 0.5 mg  0.5 mg Oral BH-q8a4p Parks Ranger, DO   0.5 mg at 01/17/22 0805   tacrolimus (PROGRAF) capsule 0.5 mg  0.5 mg Oral [name], FNP   0.5 mg at 01/17/22 0942   tacrolimus (PROGRAF) capsule 1 mg  1 mg Oral [name], FNP   1 mg at 01/16/22 0932   venlafaxine XR (EFFEXOR-XR) 24 hr capsule 150 mg  150 mg Oral Q breakfast Parks Ranger, DO   150 mg at 01/17/22 2951   warfarin (COUMADIN) tablet 2 mg  2 mg Oral Once per day on Sun Tue Wed Thu Fri Sat Parks Ranger, DO   2 mg  at 01/16/22 1703   warfarin (COUMADIN) tablet 4 mg  4 mg Oral Q Mon-1800 Suella Broad, FNP   4 mg at 01/11/22 1705   Warfarin - Physician Dosing Inpatient   Does not apply q1600 [name], [facility]  Given at 01/15/22 1600    Lab Results:  Results for orders placed or performed during the hospital encounter of 12/25/21 (from the past 48 hour(s))  CBC     Status: Abnormal   Collection Time: 01/17/22  6:21 AM  Result Value Ref Range   WBC 3.7 (L) 4.0 - 10.5 K/uL   RBC 4.68 4.22 - 5.81 MIL/uL   Hemoglobin 12.7 (L) 13.0 - 17.0 g/dL   HCT 38.9 (L) 39.0 - 52.0 %   MCV 83.1 80.0 - 100.0 fL   MCH 27.1 26.0 - 34.0 pg   MCHC 32.6 30.0 - 36.0 g/dL   RDW 15.6 (H) 11.5 - 15.5 %   Platelets 96 (L) 150 - 400 K/uL    Comment: Immature Platelet Fraction may be clinically indicated, consider ordering this additional test [id number]    nRBC 0.0 0.0 - 0.2 %    Comment: Performed at [facility], [address] [place], [place] [address]  Protime-INR     Status: Abnormal   Collection Time: 01/17/22  6:21 AM  Result Value Ref Range   Prothrombin Time 23.7 (H) 11.4 - 15.2 seconds   INR 2.1 (H) 0.8 - 1.2    Comment: (NOTE) INR goal varies based on device and disease states. Performed at [facility], [address] [place], [place] [address]   Renal function panel     Status: Abnormal   Collection Time: 01/17/22  6:21 AM  Result Value Ref Range   Sodium 130 (L) 135 - 145 mmol/L   Potassium 4.4 3.5 - 5.1 mmol/L   Chloride 100 98 - 111 mmol/L   CO2 25 22 - 32 mmol/L   Glucose, Bld 106 (H) 70 - 99 mg/dL    Comment: Glucose reference range applies only to samples taken after fasting for at least 8 hours.   BUN 27 (H) 8 - 23 mg/dL   Creatinine, Ser 2.32 (H) 0.61 - 1.24 mg/dL   Calcium 8.8 (L) 8.9 - 10.3 mg/dL   Phosphorus 3.1 2.5 - 4.6 mg/dL   Albumin 3.0 (L) 3.5 - 5.0 g/dL   GFR, Estimated 30 (L) >60 mL/min    Comment: (NOTE) Calculated using the CKD-EPI  Creatinine Equation (2021)    Anion gap 5 5 - 15    Comment: Performed at [facility], [address] [place], [place] [address]    Blood Alcohol level:  Lab Results  Component Value Date   [facility] <10 12/24/2021   ETH <10 23/34/3568    Metabolic Disorder Labs: Lab Results  Component Value Date   HGBA1C 5.2 11/17/2021   MPG 102.54 11/17/2021   MPG 105.41 08/05/2018   No results found for: PROLACTIN Lab Results  Component Value Date   CHOL 117 11/17/2021   TRIG 96 11/17/2021   HDL 28 (L) 11/17/2021   CHOLHDL 4.2 11/17/2021   VLDL 19 11/17/2021   LDLCALC 70 11/17/2021   LDLCALC 87 08/22/2019    Physical Findings: AIMS:  , ,  ,  ,    CIWA:    COWS:     Musculoskeletal: Strength & Muscle Tone: within normal limits Gait & Station: normal Patient leans: N/A  Psychiatric Specialty Exam:  Presentation  General Appearance: No data recorded Eye Contact:No data recorded Speech:No data recorded Speech Volume:No data recorded Handedness:No data recorded  Mood and Affect  Mood:No data recorded Affect:No data recorded  Thought Process  Thought Processes:No data recorded Descriptions of Associations:No data recorded Orientation:No data  recorded Thought Content:No data recorded History of Schizophrenia/Schizoaffective disorder:No  Duration of Psychotic Symptoms:No data recorded Hallucinations:No data recorded Ideas of Reference:No data recorded Suicidal Thoughts:No data recorded Homicidal Thoughts:No data recorded  Sensorium  Memory:No data recorded Judgment:No data recorded Insight:No data recorded  Executive Functions  Concentration:No data recorded Attention Span:No data recorded Recall:No data recorded [employer] [place] recorded Language:No data recorded  Psychomotor Activity  Psychomotor Activity:No data recorded  Assets  Assets:No data recorded  Sleep  Sleep:No data recorded   Physical Exam: Physical Exam Vitals and  nursing note reviewed.  Constitutional:      Appearance: Normal appearance.  HENT:     Head: Normocephalic and atraumatic.     Mouth/Throat:     Pharynx: Oropharynx is clear.  Eyes:     Pupils: Pupils are equal, round, and reactive to light.  Cardiovascular:     Rate and Rhythm: Normal rate and regular rhythm.  Pulmonary:     Effort: Pulmonary effort is normal.     Breath sounds: Normal breath sounds.  Abdominal:     General: Abdomen is flat.     Palpations: Abdomen is soft.  Musculoskeletal:        General: Normal range of motion.  Skin:    General: Skin is warm and dry.  Neurological:     General: No focal deficit present.     Mental Status: He is alert. Mental status is at baseline.  Psychiatric:        Attention and Perception: Attention normal.        Mood and Affect: Mood is anxious.        Speech: Speech normal.        Behavior: Behavior is cooperative.        Thought Content: Thought content normal.        Cognition and Memory: Cognition normal.   Review of Systems  Constitutional: Negative.   HENT: Negative.    Eyes: Negative.   Respiratory: Negative.    Cardiovascular: Negative.   Gastrointestinal: Negative.   Musculoskeletal: Negative.   Skin: Negative.   Neurological: Negative.   Psychiatric/Behavioral:  Positive for depression. Negative for hallucinations, memory loss, substance abuse and suicidal ideas. The patient is nervous/anxious and has insomnia.   Blood pressure 101/63, pulse 72, temperature 97.7 F (36.5 C), temperature source Oral, resp. rate 18, height '5\' 10"'$  (1.778 m), weight 81.2 kg, SpO2 95 %. Body mass index is 25.69 kg/m.   Treatment Plan Summary: Plan no change to medicine.  Supportive counseling and therapy.  Encouraged patient to talk with treatment team over the next couple days.  Still having trouble sleeping at night but given his poor reaction to gabapentin I am not going to add any more sedating medicines now.  Alethia Berthold,  MD 01/17/2022, 3:34 PM

## 2022-01-17 NOTE — Progress Notes (Signed)
Patient is alert and oriented x4, with a depressed affect.  Patient reports not sleeping at all, appetite fair. Patient denies SI, HI, AVH. Patient endorses anxiety and depression rating them a 5/10 and 10/10.  Pt compliant with all meds. Patient denies any complaints at this time. Patient remains safe on unit with Q 15 minutes safety checks. ?

## 2022-01-17 NOTE — Progress Notes (Signed)
Patient noted by tech sitting on the bathroom floor after he stated that he needed assistance getting dresses. Patient had just finished taking a shower and stated that he slid down to the floor due to getting dizzy. Assessment performed and no noted  injuries at this time.Patient denies hitting any part of his body. Patient denies pain and no no signs of distress noted. Patient assisted to a chair. VS WNL, ROM to all extremities noted and MD made aware. ?

## 2022-01-18 DIAGNOSIS — F333 Major depressive disorder, recurrent, severe with psychotic symptoms: Secondary | ICD-10-CM | POA: Diagnosis not present

## 2022-01-18 NOTE — BH IP Treatment Plan (Signed)
Interdisciplinary Treatment and Diagnostic Plan Update  01/18/2022 Time of Session: 9:30AM [name] MRN: [id number]  Principal Diagnosis: Acute on chronic renal insufficiency  Secondary Diagnoses: Principal Problem:   Acute on chronic renal insufficiency Active Problems:   Atrial fibrillation (HCC)   Chronic systolic CHF (congestive heart failure) ([place])   History of liver transplant ([place])   History of kidney transplant   Chronic ITP (idiopathic thrombocytopenic purpura) (HCC)   MDD (major depressive disorder), recurrent episode ([place])   Current Medications:  Current Facility-Administered Medications  Medication Dose Route Frequency Provider Last Rate Last Admin   acetaminophen (TYLENOL) tablet 650 mg  650 mg Oral Q6H PRN Suella Broad, FNP   650 mg at 01/18/22 0802   alum & mag hydroxide-simeth (MAALOX/MYLANTA) 200-200-20 MG/5ML suspension 30 mL  30 mL Oral Q4H PRN Starkes-Perry, Gayland Curry, FNP       amiodarone (PACERONE) tablet 200 mg  200 mg Oral Daily Suella Broad, FNP   200 mg at 01/18/22 0959   atorvastatin (LIPITOR) tablet 20 mg  20 mg Oral QHS Suella Broad, FNP   20 mg at 01/17/22 2147   LORazepam (ATIVAN) tablet 2 mg  2 mg Oral QHS PRN Parks Ranger, DO   2 mg at 01/18/22 0017   magnesium hydroxide (MILK OF MAGNESIA) suspension 30 mL  30 mL Oral Daily PRN Suella Broad, FNP       magnesium oxide (MAG-OX) tablet 400 mg  400 mg Oral BID Suella Broad, FNP   400 mg at 01/18/22 4627   melatonin tablet 10 mg  10 mg Oral QHS Parks Ranger, DO   10 mg at 01/17/22 2147   OLANZapine (ZYPREXA) tablet 15 mg  15 mg Oral QHS Parks Ranger, DO   15 mg at 01/17/22 2147   ondansetron (ZOFRAN) tablet 4 mg  4 mg Oral Q8H PRN He, [name], MD   4 mg at 12/27/21 0848   risperiDONE (RISPERDAL) tablet 0.5 mg  0.5 mg Oral [id number] Parks Ranger, DO   0.5 mg at 01/18/22 0802   tacrolimus (PROGRAF) capsule 0.5  mg  0.5 mg Oral [name], FNP   0.5 mg at 01/17/22 0942   tacrolimus (PROGRAF) capsule 1 mg  1 mg Oral [name], FNP   1 mg at 01/18/22 0959   venlafaxine XR (EFFEXOR-XR) 24 hr capsule 150 mg  150 mg Oral Q breakfast Parks Ranger, DO   150 mg at 01/18/22 0350   warfarin (COUMADIN) tablet 2 mg  2 mg Oral Once per day on Sun Tue Wed Thu Fri Sat Parks Ranger, DO   2 mg at 01/17/22 1706   warfarin (COUMADIN) tablet 4 mg  4 mg Oral Q Mon-1800 Suella Broad, FNP   4 mg at 01/11/22 1705   Warfarin - Physician Dosing Inpatient   Does not apply q1600 [name], [facility]   Given at 01/17/22 1731   PTA Medications: Medications Prior to Admission  Medication Sig Dispense Refill Last Dose   allopurinol (ZYLOPRIM) 100 MG tablet Take 100 mg by mouth daily.      amiodarone (PACERONE) 200 MG tablet Take 1 tablet (200 mg total) by mouth daily. 30 tablet 5    ARIPiprazole (ABILIFY) 5 MG tablet Take 1 tablet (5 mg total) by mouth at bedtime. 30 tablet 2    atorvastatin (LIPITOR) 40 MG tablet Take 1 tablet (40 mg total) by mouth  at bedtime. (Patient taking differently: Take 20 mg by mouth at bedtime.) 90 tablet 3    calcitRIOL (ROCALTROL) 0.25 MCG capsule Take 0.25 mcg by mouth daily.      Cholecalciferol (VITAMIN D3) 50 MCG (2000 UT) TABS Take 2,000 Units by mouth daily.      clonazePAM (KLONOPIN) 1 MG tablet Take 1 tablet (1 mg total) by mouth at bedtime. 30 tablet 2    Cyanocobalamin (VITAMIN B12) 1000 MCG TBCR Take 1,000 mcg by mouth daily.      dapagliflozin propanediol (FARXIGA) 10 MG TABS tablet Take 1 tablet (10 mg total) by mouth daily. 30 tablet 6    EPINEPHrine (EPI-PEN) 0.3 mg/0.3 mL DEVI Inject 0.3 mg into the muscle once as needed (severe allergic reaction). (Patient not taking: Reported on 12/24/2021)      ferrous sulfate 325 (65 FE) MG tablet Take 325 mg by mouth daily with breakfast.      fish oil-omega-3 fatty acids 1000 MG  capsule Take 1 g by mouth 2 (two) times daily.      MAGNESIUM-OXIDE 400 (240 Mg) MG tablet Take 400 mg by mouth 2 (two) times daily.      mirtazapine (REMERON) 15 MG tablet Take 1 tablet (15 mg total) by mouth at bedtime. 30 tablet 2    nitroGLYCERIN (NITROSTAT) 0.4 MG SL tablet Place 0.4 mg under the tongue every 5 (five) minutes as needed for chest pain. (Patient not taking: Reported on 12/24/2021)      potassium chloride SA (KLOR-CON) 20 MEQ tablet Take 2 tablets (40 mEq total) by mouth 3 (three) times daily. 90 tablet 6    tacrolimus (PROGRAF) 0.5 MG capsule Take 0.5 mg by mouth See admin instructions. Takes 0.5 mg  tablet by mouth one day and 1 mg tablets next day (1 and 2 tablets on alternate days)      torsemide (DEMADEX) 20 MG tablet Take 5 tablets (100 mg total) by mouth daily. 210 tablet 6    traZODone (DESYREL) 100 MG tablet Take 1 tablet (100 mg total) by mouth at bedtime as needed for sleep. 30 tablet 2    warfarin (COUMADIN) 4 MG tablet TAKE AS DIRECTED BY COUMADIN CLINIC (Patient taking differently: Take 2-4 mg by mouth See admin instructions. Take '4mg'$  by mouth on Monday, then take '2mg'$  (1/2 tablet) all other days) 65 tablet 1     Patient Stressors: Health problems   Other: loneliness    Patient Strengths: Ability for insight  Average or above average intelligence   Treatment Modalities: Medication Management, Group therapy, Case management,  1 to 1 session with clinician, Psychoeducation, Recreational therapy.   Physician Treatment Plan for Primary Diagnosis: Acute on chronic renal insufficiency Long Term Goal(s): Improvement in symptoms so as ready for discharge   Short Term Goals: Ability to identify changes in lifestyle to reduce recurrence of condition will improve Ability to verbalize feelings will improve Ability to disclose and discuss suicidal ideas Ability to demonstrate self-control will improve Ability to identify and develop effective coping behaviors will  improve Ability to maintain clinical measurements within normal limits will improve Compliance with prescribed medications will improve Ability to identify triggers associated with substance abuse/mental health issues will improve  Medication Management: Evaluate patient's response, side effects, and tolerance of medication regimen.  Therapeutic Interventions: 1 to 1 sessions, Unit Group sessions and Medication administration.  Evaluation of Outcomes: Progressing  Physician Treatment Plan for Secondary Diagnosis: Principal Problem:   Acute on chronic renal insufficiency Active  Problems:   Atrial fibrillation (HCC)   Chronic systolic CHF (congestive heart failure) ([place])   History of liver transplant ([place])   History of kidney transplant   Chronic ITP (idiopathic thrombocytopenic purpura) (HCC)   MDD (major depressive disorder), recurrent episode ([place])  Long Term Goal(s): Improvement in symptoms so as ready for discharge   Short Term Goals: Ability to identify changes in lifestyle to reduce recurrence of condition will improve Ability to verbalize feelings will improve Ability to disclose and discuss suicidal ideas Ability to demonstrate self-control will improve Ability to identify and develop effective coping behaviors will improve Ability to maintain clinical measurements within normal limits will improve Compliance with prescribed medications will improve Ability to identify triggers associated with substance abuse/mental health issues will improve     Medication Management: Evaluate patient's response, side effects, and tolerance of medication regimen.  Therapeutic Interventions: 1 to 1 sessions, Unit Group sessions and Medication administration.  Evaluation of Outcomes: Progressing   RN Treatment Plan for Primary Diagnosis: Acute on chronic renal insufficiency Long Term Goal(s): Knowledge of disease and therapeutic regimen to maintain health will improve  Short Term  Goals: Ability to remain free from injury will improve, Ability to verbalize frustration and anger appropriately will improve, Ability to demonstrate self-control, Ability to participate in decision making will improve, Ability to verbalize feelings will improve, Ability to disclose and discuss suicidal ideas, Ability to identify and develop effective coping behaviors will improve, and Compliance with prescribed medications will improve  Medication Management: RN will administer medications as ordered by provider, will assess and evaluate patient's response and provide education to patient for prescribed medication. RN will report any adverse and/or side effects to prescribing provider.  Therapeutic Interventions: 1 on 1 counseling sessions, Psychoeducation, Medication administration, Evaluate responses to treatment, Monitor vital signs and CBGs as ordered, Perform/monitor CIWA, COWS, AIMS and Fall Risk screenings as ordered, Perform wound care treatments as ordered.  Evaluation of Outcomes: Progressing   LCSW Treatment Plan for Primary Diagnosis: Acute on chronic renal insufficiency Long Term Goal(s): Safe transition to appropriate next level of care at discharge, Engage patient in therapeutic group addressing interpersonal concerns.  Short Term Goals: Engage patient in aftercare planning with referrals and resources, Increase social support, Increase ability to appropriately verbalize feelings, Increase emotional regulation, Facilitate acceptance of mental health diagnosis and concerns, Identify triggers associated with mental health/substance abuse issues, and Increase skills for wellness and recovery  Therapeutic Interventions: Assess for all discharge needs, 1 to 1 time with Social worker, Explore available resources and support systems, Assess for adequacy in community support network, Educate family and significant other(s) on suicide prevention, Complete Psychosocial Assessment, Interpersonal  group therapy.  Evaluation of Outcomes: Progressing   Progress in Treatment: Attending groups: Yes. Participating in groups: Yes. Taking medication as prescribed: Yes. Toleration medication: Yes. Family/Significant other contact made: Yes, individual(s) contacted:  SPE completed with sister-in-law, Kathlen Mody Patient understands diagnosis: Yes. Discussing patient identified problems/goals with staff: Yes. Medical problems stabilized or resolved: Yes. Denies suicidal/homicidal ideation: Yes. Issues/concerns per patient self-inventory: No. Other: None  New problem(s) identified: No, Describe:  None  New Short Term/Long Term Goal(s): Patient to work towards medication management for mood stabilization; elimination of SI thoughts; development of comprehensive mental wellness plan.Update 01/07/22: No changes at this time. Update 01/12/22: No changes at this time. Update 01/18/22: No changes at this time.    Patient Goals: Patient stated that he wants more sleep and work to work on his depression  and eliminating his SI thoughts.No additional goals identified at this time. Update 01/07/22: No changes at this time. Update 01/12/22: No changes at this time. Update 01/18/22: No changes at this time.    Discharge Plan or Barriers: No psychosocial barriers identified at this time, patient to return to place of residence when appropriate for discharge. CSW will assist pt in development of an appropriate discharge/aftercare plan. Update 01/07/22: No changes at this time. Update 01/12/22: No changes at this time. Update 01/18/22: No changes at this time.    Reason for Continuation of Hospitalization: Depression, Anxiety Other: Insomnia   Estimated Length of Stay: TBD    Scribe for Treatment Team: Jassmin Kemmerer A [place], Latanya Presser 01/18/2022 10:22 AM

## 2022-01-18 NOTE — Progress Notes (Signed)
0015  Patient ambulated independently to nurses station requesting Ativan to help him sleep.  PRN Ativan '2mg'$  given as ordered. ? ?84 Patient came stumbling out of room with unsteady gait grabbing hall rail and headed towards end of hallway.  Moderate dyspnea and mild confusion noted. Assisted pt back to bed.VS taken. BP 93/68, P98, O2 sat 96% on RA, RR  24. Patient reoriented easily.  When asked patient if he has CHF or prior episodes of dyspnea in past he states he has and "I need to tell my heart doctor he needs to put me back on fluid pill."  Resp rate and breathing improved with rest.  Instructed patient to use rolling walker when ambulating with understanding verbalized. Will maintain high fall risk protocol and monitor with Q15 min safety checks.  ?

## 2022-01-18 NOTE — Plan of Care (Signed)
Patient remain alert and oriented with some confusion. Remain ambulatory with front wheel walker with unsteady gait. Verbalize pain to right hip rating 7/10. Denies anxiety and depression. Denies SI, HI, AVH. Currently in the day room eating breakfast among staff and peers. Compliant with all due medications. Remain safe on the unit with Q 15 minute safety check. ? ?Problem: Education: ?Goal: Knowledge of [facility] Education information/materials will improve ?Outcome: Progressing ?Goal: Emotional status will improve ?Outcome: Progressing ?Goal: Mental status will improve ?Outcome: Progressing ?Goal: Verbalization of understanding the information provided will improve ?Outcome: Progressing ?  ?Problem: Activity: ?Goal: Interest or engagement in activities will improve ?Outcome: Progressing ?Goal: Sleeping patterns will improve ?Outcome: Progressing ?  ?Problem: Coping: ?Goal: Ability to verbalize frustrations and anger appropriately will improve ?Outcome: Progressing ?Goal: Ability to demonstrate self-control will improve ?Outcome: Progressing ?  ?Problem: Health Behavior/Discharge Planning: ?Goal: Identification of resources available to assist in meeting health care needs will improve ?Outcome: Progressing ?Goal: Compliance with treatment plan for underlying cause of condition will improve ?Outcome: Progressing ?  ?Problem: Physical Regulation: ?Goal: Ability to maintain clinical measurements within normal limits will improve ?Outcome: Progressing ?  ?Problem: Safety: ?Goal: Periods of time without injury will increase ?Outcome: Progressing ?  ?Problem: Education: ?Goal: Utilization of techniques to improve thought processes will improve ?Outcome: Progressing ?Goal: Knowledge of the prescribed therapeutic regimen will improve ?Outcome: Progressing ?  ?Problem: Activity: ?Goal: Interest or engagement in leisure activities will improve ?Outcome: Progressing ?Goal: Imbalance in normal sleep/wake cycle will  improve ?Outcome: Progressing ?  ?Problem: Coping: ?Goal: Coping ability will improve ?Outcome: Progressing ?Goal: Will verbalize feelings ?Outcome: Progressing ?  ?Problem: Health Behavior/Discharge Planning: ?Goal: Ability to make decisions will improve ?Outcome: Progressing ?Goal: Compliance with therapeutic regimen will improve ?Outcome: Progressing ?  ?Problem: Role Relationship: ?Goal: Will demonstrate positive changes in social behaviors and relationships ?Outcome: Progressing ?  ?Problem: Safety: ?Goal: Ability to disclose and discuss suicidal ideas will improve ?Outcome: Progressing ?Goal: Ability to identify and utilize support systems that promote safety will improve ?Outcome: Progressing ?  ?Problem: Self-Concept: ?Goal: Will verbalize positive feelings about self ?Outcome: Progressing ?Goal: Level of anxiety will decrease ?Outcome: Progressing ?  ?Problem: Education: ?Goal: Ability to make informed decisions regarding treatment will improve ?Outcome: Progressing ?  ?Problem: Coping: ?Goal: Coping ability will improve ?Outcome: Progressing ?  ?Problem: Health Behavior/Discharge Planning: ?Goal: Identification of resources available to assist in meeting health care needs will improve ?Outcome: Progressing ?  ?Problem: Medication: ?Goal: Compliance with prescribed medication regimen will improve ?Outcome: Progressing ?  ?Problem: Self-Concept: ?Goal: Ability to disclose and discuss suicidal ideas will improve ?Outcome: Progressing ?Goal: Will verbalize positive feelings about self ?Outcome: Progressing ?  ?Problem: Education: ?Goal: Ability to state activities that reduce stress will improve ?Outcome: Progressing ?  ?Problem: Coping: ?Goal: Ability to identify and develop effective coping behavior will improve ?Outcome: Progressing ?  ?Problem: Self-Concept: ?Goal: Ability to identify factors that promote anxiety will improve ?Outcome: Progressing ?Goal: Level of anxiety will decrease ?Outcome:  Progressing ?Goal: Ability to modify response to factors that promote anxiety will improve ?Outcome: Progressing ?  ?

## 2022-01-18 NOTE — Progress Notes (Signed)
[facility] MD Progress Note  01/18/2022 10:57 AM [name]  MRN:  [id number] Subjective: [name] still complains of not sleeping very well.  He received Ativan last night but it did help him sleep and he just got confused and wandered.  Social work is setting up an appointment for sleep lab.  [name]'s mood has improved and I told him that he needs a sleep study done and we can just keep her on medications at his nighttime regimen. Principal Problem: Acute on chronic renal insufficiency Diagnosis: Principal Problem:   Acute on chronic renal insufficiency Active Problems:   Atrial fibrillation (HCC)   Chronic systolic CHF (congestive heart failure) (HCC)   History of liver transplant ([place])   History of kidney transplant   Chronic ITP (idiopathic thrombocytopenic purpura) (HCC)   MDD (major depressive disorder), recurrent episode ([place])  Total Time spent with patient: 15 minutes  Past Psychiatric History: [facility]  Past Medical History:  Past Medical History:  Diagnosis Date   Atrial fibrillation ([place]) 08/2009   CAD (coronary artery disease)    s/p CABG -- 1992   Depression    ESRD (end stage renal disease) ([place])    HLD (hyperlipidemia)    HTN (hypertension)    Idiopathic thrombocytopenia (HCC)     Past Surgical History:  Procedure Laterality Date   APPENDECTOMY     CORONARY ARTERY BYPASS GRAFT     FACIAL COSMETIC SURGERY     GALLBLADDER SURGERY     HERNIA REPAIR     KIDNEY TRANSPLANT     LEFT HEART CATH AND CORONARY ANGIOGRAPHY N/A 08/07/2018   Procedure: LEFT HEART CATH AND CORONARY ANGIOGRAPHY;  Surgeon: Sherren Mocha, MD;  Location: [place] [facility];  Service: Cardiovascular;  Laterality: N/A;   LIVER TRANSPLANT     PRESSURE SENSOR/CARDIOMEMS N/A 08/04/2021   Procedure: PRESSURE SENSOR/CARDIOMEMS;  Surgeon: Larey Dresser, MD;  Location: [place] [facility];  Service: Cardiovascular;  Laterality: N/A;   RIGHT HEART CATH N/A 06/25/2021   Procedure: RIGHT HEART CATH;   Surgeon: Larey Dresser, MD;  Location: [place] [facility];  Service: Cardiovascular;  Laterality: N/A;   Family History:  Family History  Problem Relation Age of Onset   Atrial fibrillation Mother    Breast cancer Mother    Renal cancer Father    Hypertension Brother    Hyperlipidemia Brother    Other Brother        stent    Renal Disease Maternal Grandmother    Stroke Maternal Grandmother    Heart attack Maternal Grandfather    Cancer Paternal Grandmother    Heart failure Paternal Grandfather    Emphysema Paternal Grandfather    Bronchiolitis Paternal Grandfather     Social History:  Social History   Substance and Sexual Activity  Alcohol Use No     Social History   Substance and Sexual Activity  Drug Use No    Social History   Socioeconomic History   Marital status: Single    Spouse name: Not on file   Number of children: 0   Years of education: Not on file   Highest education level: Not on file  Occupational History   Occupation: retired  Tobacco Use   Smoking status: Never   Smokeless tobacco: Never  Vaping Use   Vaping Use: Never used  Substance and Sexual Activity   Alcohol use: No   Drug use: No   Sexual activity: Not on file  Other Topics Concern  Not on file  Social History Narrative   Not on file   Social Determinants of Health   Financial Resource Strain: Low Risk    Difficulty of Paying Living Expenses: Not very hard  Food Insecurity: No Food Insecurity   Worried About Running Out of Food in the Last Year: Never true   Ran Out of Food in the Last Year: Never true  Transportation Needs: No Transportation Needs   Lack of Transportation (Medical): No   Lack of Transportation (Non-Medical): No  Physical Activity: Not on file  Stress: Not on file  Social Connections: Not on file   Additional Social History:                         Sleep: Poor  Appetite:  Fair  Current Medications: Current Facility-Administered  Medications  Medication Dose Route Frequency Provider Last Rate Last Admin   acetaminophen (TYLENOL) tablet 650 mg  650 mg Oral Q6H PRN Suella Broad, FNP   650 mg at 01/18/22 0802   alum & mag hydroxide-simeth (MAALOX/MYLANTA) 200-200-20 MG/5ML suspension 30 mL  30 mL Oral Q4H PRN Suella Broad, FNP       amiodarone (PACERONE) tablet 200 mg  200 mg Oral Daily Suella Broad, FNP   200 mg at 01/18/22 0959   atorvastatin (LIPITOR) tablet 20 mg  20 mg Oral QHS Suella Broad, FNP   20 mg at 01/17/22 2147   magnesium hydroxide (MILK OF MAGNESIA) suspension 30 mL  30 mL Oral Daily PRN Suella Broad, FNP       magnesium oxide (MAG-OX) tablet 400 mg  400 mg Oral BID Suella Broad, FNP   400 mg at 01/18/22 7824   melatonin tablet 10 mg  10 mg Oral QHS Parks Ranger, DO   10 mg at 01/17/22 2147   OLANZapine (ZYPREXA) tablet 15 mg  15 mg Oral QHS Parks Ranger, DO   15 mg at 01/17/22 2147   ondansetron (ZOFRAN) tablet 4 mg  4 mg Oral Q8H PRN He, [name], MD   4 mg at 12/27/21 0848   risperiDONE (RISPERDAL) tablet 0.5 mg  0.5 mg Oral BH-q8a4p Parks Ranger, DO   0.5 mg at 01/18/22 0802   tacrolimus (PROGRAF) capsule 0.5 mg  0.5 mg Oral [name], FNP   0.5 mg at 01/17/22 0942   tacrolimus (PROGRAF) capsule 1 mg  1 mg Oral [name], FNP   1 mg at 01/18/22 0959   venlafaxine XR (EFFEXOR-XR) 24 hr capsule 150 mg  150 mg Oral Q breakfast Parks Ranger, DO   150 mg at 01/18/22 2353   warfarin (COUMADIN) tablet 2 mg  2 mg Oral Once per day on Sun Tue Wed Thu Fri Sat Parks Ranger, DO   2 mg at 01/17/22 1706   warfarin (COUMADIN) tablet 4 mg  4 mg Oral Q Mon-1800 Suella Broad, FNP   4 mg at 01/11/22 1705   Warfarin - Physician Dosing Inpatient   Does not apply q1600 [name], [facility]   Given at 01/17/22 1731    Lab Results:  Results for orders placed or performed  during the hospital encounter of 12/25/21 (from the past 48 hour(s))  CBC     Status: Abnormal   Collection Time: 01/17/22  6:21 AM  Result Value Ref Range   WBC 3.7 (L) 4.0 - 10.5 K/uL  RBC 4.68 4.22 - 5.81 MIL/uL   Hemoglobin 12.7 (L) 13.0 - 17.0 g/dL   HCT 38.9 (L) 39.0 - 52.0 %   MCV 83.1 80.0 - 100.0 fL   MCH 27.1 26.0 - 34.0 pg   MCHC 32.6 30.0 - 36.0 g/dL   RDW 15.6 (H) 11.5 - 15.5 %   Platelets 96 (L) 150 - 400 K/uL    Comment: Immature Platelet Fraction may be clinically indicated, consider ordering this additional test [id number]    nRBC 0.0 0.0 - 0.2 %    Comment: Performed at [facility], [address] [place], [place] [address]  Protime-INR     Status: Abnormal   Collection Time: 01/17/22  6:21 AM  Result Value Ref Range   Prothrombin Time 23.7 (H) 11.4 - 15.2 seconds   INR 2.1 (H) 0.8 - 1.2    Comment: (NOTE) INR goal varies based on device and disease states. Performed at [facility], [address] [place], [place] [address]   Renal function panel     Status: Abnormal   Collection Time: 01/17/22  6:21 AM  Result Value Ref Range   Sodium 130 (L) 135 - 145 mmol/L   Potassium 4.4 3.5 - 5.1 mmol/L   Chloride 100 98 - 111 mmol/L   CO2 25 22 - 32 mmol/L   Glucose, Bld 106 (H) 70 - 99 mg/dL    Comment: Glucose reference range applies only to samples taken after fasting for at least 8 hours.   BUN 27 (H) 8 - 23 mg/dL   Creatinine, Ser 2.32 (H) 0.61 - 1.24 mg/dL   Calcium 8.8 (L) 8.9 - 10.3 mg/dL   Phosphorus 3.1 2.5 - 4.6 mg/dL   Albumin 3.0 (L) 3.5 - 5.0 g/dL   GFR, Estimated 30 (L) >60 mL/min    Comment: (NOTE) Calculated using the CKD-EPI Creatinine Equation (2021)    Anion gap 5 5 - 15    Comment: Performed at [facility], [address] [place], [place] [address]    Blood Alcohol level:  Lab Results  Component Value Date   [facility] <10 12/24/2021   ETH <10 78/67/6720    Metabolic Disorder Labs: Lab Results   Component Value Date   HGBA1C 5.2 11/17/2021   MPG 102.54 11/17/2021   MPG 105.41 08/05/2018   No results found for: PROLACTIN Lab Results  Component Value Date   CHOL 117 11/17/2021   TRIG 96 11/17/2021   HDL 28 (L) 11/17/2021   CHOLHDL 4.2 11/17/2021   VLDL 19 11/17/2021   LDLCALC 70 11/17/2021   LDLCALC 87 08/22/2019    Physical Findings: AIMS:  , ,  ,  ,    CIWA:    COWS:     Musculoskeletal: Strength & Muscle Tone: within normal limits Gait & Station: normal Patient leans: N/A  Psychiatric Specialty Exam:  Presentation  General Appearance: No data recorded Eye Contact:No data recorded Speech:No data recorded Speech Volume:No data recorded Handedness:No data recorded  Mood and Affect  Mood:No data recorded Affect:No data recorded  Thought Process  Thought Processes:No data recorded Descriptions of Associations:No data recorded Orientation:No data recorded Thought Content:No data recorded History of Schizophrenia/Schizoaffective disorder:No  Duration of Psychotic Symptoms:No data recorded Hallucinations:No data recorded Ideas of Reference:No data recorded Suicidal Thoughts:No data recorded Homicidal Thoughts:No data recorded  Sensorium  Memory:No data recorded Judgment:No data recorded Insight:No data recorded  Executive Functions  Concentration:No data recorded Attention Span:No data recorded Recall:No data recorded [employer]  of Knowledge:No data recorded Language:No data recorded  Psychomotor Activity  Psychomotor Activity:No data recorded  Assets  Assets:No data recorded  Sleep  Sleep:No data recorded   Physical Exam: Physical Exam Vitals and nursing note reviewed.  Constitutional:      Appearance: Normal appearance. He is normal weight.  Neurological:     General: No focal deficit present.     Mental Status: He is alert and oriented to person, place, and time.  Psychiatric:        Attention and Perception: Attention and  perception normal.        Mood and Affect: Mood is depressed. Affect is flat.        Speech: Speech normal.        Behavior: Behavior normal. Behavior is cooperative.        Thought Content: Thought content normal.        Cognition and Memory: Cognition and memory normal.        Judgment: Judgment normal.   Review of Systems  Constitutional: Negative.   HENT: Negative.    Eyes: Negative.   Respiratory: Negative.    Cardiovascular: Negative.   Gastrointestinal: Negative.   Genitourinary: Negative.   Musculoskeletal: Negative.   Skin: Negative.   Neurological: Negative.   Endo/Heme/Allergies: Negative.   Psychiatric/Behavioral:  Positive for depression. The patient is nervous/anxious and has insomnia.   Blood pressure (!) 92/59, pulse 74, temperature 98.2 F (36.8 C), temperature source Oral, resp. rate 20, height '5\' 10"'$  (1.778 m), weight 81.2 kg, SpO2 92 %. Body mass index is 25.69 kg/m.   Treatment Plan Summary: Daily contact with patient to assess and evaluate symptoms and progress in treatment, Medication management, and Plan discontinue Ativan.  Parks Ranger, DO 01/18/2022, 10:57 AM

## 2022-01-18 NOTE — Progress Notes (Signed)
Patient came to nurses station using rolling walker stating, "There is a man in my room.  Can you help me get him out so I can get back to bed and sleep?"  Patient reoriented and reassured there is no one in his room.  Patient appeared relieved and returned to room.  Will continue to monitor with Q 15 min safety checks. ?

## 2022-01-18 NOTE — Progress Notes (Signed)
Recreation Therapy Notes ? ?  ?Date: 01/18/2022 ?  ?Time: 1:40 pm   ?  ?Location: [place]   ?  ?Behavioral response: Appropriate ?  ?Intervention Topic:  Goals     ?  ?Discussion/Intervention:  ?Group content on today was focused on goals. Patients described what goals are and how they define goals. Individuals expressed how they go about setting goals and reaching them. The group identified how important goals are and if they make short term goals to reach long term goals. Patients described how many goals they work on at a time and what affects them not reaching their goal. Individuals described how much time they put into planning and obtaining their goals. The group participated in the intervention ?My Goal Board? and made personal goal boards to help them achieve their goal. ?Clinical Observations/Feedback: ?Patient came to group late and was focused on what peers and staff had to say about goals. Individual was social with peers and staff while participating in the intervention.    ?Kaitlyne Friedhoff LRT/CTRS  ?  ?  ?  ? ? ? ? ? ? ? ?Shonta Phillis ?01/18/2022 3:20 PM ?

## 2022-01-18 NOTE — BHH Group Notes (Signed)
[facility] Group Notes:  (Nursing/MHT/Case Management/Adjunct) ? ?Date:  01/18/2022  ?Time:  10:00 AM ? ?Type of Therapy:  Group Therapy ? ?Participation Level:  Minimal ? ?Participation Quality:  Inattentive and Redirectable ? ?Affect:  Flat ? ?Cognitive:  Confused and Lacking ? ?Insight:  Limited ? ?Engagement in Group:  Limited and Poor ? ?Modes of Intervention:  Discussion ? ?Summary of Progress/Problems: ?The pt. Attended group but was not very engaged. The pt. Only shared with the group when directly asked to share. ?Juliette Alcide ?01/18/2022, 12:13 PM ?

## 2022-01-18 NOTE — Plan of Care (Signed)
Patient presents A&Ox4 stating, "I feel like I'm doing better.  I really do."   Denies AVH, SI, HI or pain.   Patient endorses anxiety and depression rating both 3/10.  Reports sleeping poorly last night, appetite good and energy level fair.   Pt compliant with all meds.  Patient spent most of evening in day room watching TV.  Denies needs or complaints.  Will continue with Q15 min safety checks. ?  ?Problem: Education: ?Goal: Knowledge of [facility] Education information/materials will improve ?Outcome: Progressing ?Goal: Emotional status will improve ?Outcome: Progressing ?Goal: Mental status will improve ?Outcome: Progressing ?  ?Problem: Activity: ?Goal: Interest or engagement in activities will improve ?Outcome: Progressing ?  ?Problem: Activity: ?Goal: Sleeping patterns will improve ?Outcome: Not Progressing ?  ?

## 2022-01-19 ENCOUNTER — Encounter (HOSPITAL_BASED_OUTPATIENT_CLINIC_OR_DEPARTMENT_OTHER): Payer: Self-pay

## 2022-01-19 DIAGNOSIS — F333 Major depressive disorder, recurrent, severe with psychotic symptoms: Secondary | ICD-10-CM | POA: Diagnosis not present

## 2022-01-19 DIAGNOSIS — G4700 Insomnia, unspecified: Secondary | ICD-10-CM

## 2022-01-19 DIAGNOSIS — R454 Irritability and anger: Secondary | ICD-10-CM

## 2022-01-19 LAB — POCT INR: INR: 2.1 (ref 2.0–3.0)

## 2022-01-19 MED ORDER — TRAZODONE HCL 100 MG PO TABS
100.0000 mg | ORAL_TABLET | Freq: Every day | ORAL | Status: DC
Start: 2022-01-19 — End: 2022-01-22
  Administered 2022-01-19 – 2022-01-21 (×3): 100 mg via ORAL
  Filled 2022-01-19 (×3): qty 1

## 2022-01-19 NOTE — Progress Notes (Signed)
[facility] MD Progress Note ? ?01/19/2022 12:14 PM ?[place]  ?MRN:  [id number] ?Subjective: [name] states that he is feeling better.  His affect is improving.  I took him off the Ativan and he asked me why, I told him the nurses that night said that you were confused and wandering.  He asks to restart the trazodone at night and I told him that was fine.  He is going to have a sleep study upon discharge.  He is ready to go this week and he is going to arrange to be picked up by his brother.  He denies any suicidal ideation. ? ?Principal Problem: Acute on chronic renal insufficiency ?Diagnosis: Principal Problem: ?  Acute on chronic renal insufficiency ?Active Problems: ?  Atrial fibrillation ([place]) ?  Chronic systolic CHF (congestive heart failure) ([place]) ?  History of liver transplant ([place]) ?  History of kidney transplant ?  Chronic ITP (idiopathic thrombocytopenic purpura) (HCC) ?  MDD (major depressive disorder), recurrent episode ([place]) ? ?Total Time spent with patient: 15 minutes ? ?Past Psychiatric History: [facility] ? ?Past Medical History:  ?Past Medical History:  ?Diagnosis Date  ? Atrial fibrillation ([place]) 08/2009  ? CAD (coronary artery disease)   ? s/p CABG -- 1992  ? Depression   ? ESRD (end stage renal disease) ([place])   ? HLD (hyperlipidemia)   ? HTN (hypertension)   ? Idiopathic thrombocytopenia (HCC)   ?  ?Past Surgical History:  ?Procedure Laterality Date  ? APPENDECTOMY    ? CORONARY ARTERY BYPASS GRAFT    ? FACIAL COSMETIC SURGERY    ? GALLBLADDER SURGERY    ? HERNIA REPAIR    ? KIDNEY TRANSPLANT    ? LEFT HEART CATH AND CORONARY ANGIOGRAPHY N/A 08/07/2018  ? Procedure: LEFT HEART CATH AND CORONARY ANGIOGRAPHY;  Surgeon: Sherren Mocha, MD;  Location: [place] [facility];  Service: Cardiovascular;  Laterality: N/A;  ? LIVER TRANSPLANT    ? PRESSURE SENSOR/CARDIOMEMS N/A 08/04/2021  ? Procedure: PRESSURE SENSOR/CARDIOMEMS;  Surgeon: Larey Dresser, MD;  Location: [place] [facility];  Service:  Cardiovascular;  Laterality: N/A;  ? RIGHT HEART CATH N/A 06/25/2021  ? Procedure: RIGHT HEART CATH;  Surgeon: Larey Dresser, MD;  Location: [place] [facility];  Service: Cardiovascular;  Laterality: N/A;  ? ?Family History:  ?Family History  ?Problem Relation Age of Onset  ? Atrial fibrillation Mother   ? Breast cancer Mother   ? Renal cancer Father   ? Hypertension Brother   ? Hyperlipidemia Brother   ? Other Brother   ?     stent   ? Renal Disease Maternal Grandmother   ? Stroke Maternal Grandmother   ? Heart attack Maternal Grandfather   ? Cancer Paternal Grandmother   ? Heart failure Paternal Grandfather   ? Emphysema Paternal Grandfather   ? Bronchiolitis Paternal Grandfather   ? ? ?Social History:  ?Social History  ? ?Substance and Sexual Activity  ?Alcohol Use No  ?   ?Social History  ? ?Substance and Sexual Activity  ?Drug Use No  ?  ?Social History  ? ?Socioeconomic History  ? Marital status: Single  ?  Spouse name: Not on file  ? Number of children: 0  ? Years of education: Not on file  ? Highest education level: Not on file  ?Occupational History  ? Occupation: retired  ?Tobacco Use  ? Smoking status: Never  ? Smokeless tobacco: Never  ?Vaping Use  ? Vaping Use: Never  used  ?Substance and Sexual Activity  ? Alcohol use: No  ? Drug use: No  ? Sexual activity: Not on file  ?Other Topics Concern  ? Not on file  ?Social History Narrative  ? Not on file  ? ?Social Determinants of Health  ? ?Financial Resource Strain: Low Risk   ? Difficulty of Paying Living Expenses: Not very hard  ?Food Insecurity: No Food Insecurity  ? Worried About Charity fundraiser in the Last Year: Never true  ? Ran Out of Food in the Last Year: Never true  ?Transportation Needs: No Transportation Needs  ? Lack of Transportation (Medical): No  ? Lack of Transportation (Non-Medical): No  ?Physical Activity: Not on file  ?Stress: Not on file  ?Social Connections: Not on file  ? ?Additional Social History:  ?  ?  ?  ?  ?  ?  ?  ?  ?  ?   ?  ? ?Sleep: Poor ? ?Appetite:  Fair ? ?Current Medications: ?Current Facility-Administered Medications  ?Medication Dose Route Frequency Provider Last Rate Last Admin  ? acetaminophen (TYLENOL) tablet 650 mg  650 mg Oral Q6H PRN Suella Broad, FNP   650 mg at 01/18/22 2349  ? alum & mag hydroxide-simeth (MAALOX/MYLANTA) 200-200-20 MG/5ML suspension 30 mL  30 mL Oral Q4H PRN Starkes-Perry, Gayland Curry, FNP      ? amiodarone (PACERONE) tablet 200 mg  200 mg Oral Daily Suella Broad, FNP   200 mg at 01/19/22 0913  ? atorvastatin (LIPITOR) tablet 20 mg  20 mg Oral QHS Suella Broad, FNP   20 mg at 01/18/22 2203  ? magnesium hydroxide (MILK OF MAGNESIA) suspension 30 mL  30 mL Oral Daily PRN Suella Broad, FNP      ? magnesium oxide (MAG-OX) tablet 400 mg  400 mg Oral BID Suella Broad, FNP   400 mg at 01/19/22 0913  ? melatonin tablet 10 mg  10 mg Oral QHS Parks Ranger, DO   10 mg at 01/18/22 2203  ? OLANZapine (ZYPREXA) tablet 15 mg  15 mg Oral QHS Parks Ranger, DO   15 mg at 01/18/22 2203  ? ondansetron (ZOFRAN) tablet 4 mg  4 mg Oral Q8H PRN He, [name], MD   4 mg at 12/27/21 0848  ? risperiDONE (RISPERDAL) tablet 0.5 mg  0.5 mg Oral BH-q8a4p Parks Ranger, DO   0.5 mg at 01/19/22 8115  ? tacrolimus (PROGRAF) capsule 0.5 mg  0.5 mg Oral [name], FNP   0.5 mg at 01/19/22 0913  ? tacrolimus (PROGRAF) capsule 1 mg  1 mg Oral [name], FNP   1 mg at 01/18/22 7262  ? traZODone (DESYREL) tablet 100 mg  100 mg Oral QHS Parks Ranger, DO      ? venlafaxine XR (EFFEXOR-XR) 24 hr capsule 150 mg  150 mg Oral Q breakfast Parks Ranger, DO   150 mg at 01/19/22 0355  ? warfarin (COUMADIN) tablet 2 mg  2 mg Oral Once per day on Sun Tue Wed Thu Fri Sat Parks Ranger, DO   2 mg at 01/17/22 1706  ? warfarin (COUMADIN) tablet 4 mg  4 mg Oral Q Mon-1800 Suella Broad, FNP   4 mg at  01/18/22 1820  ? Warfarin - Physician Dosing Inpatient   Does not apply q1600 [name], [facility]   Given at 01/18/22 1819  ? ? ?Lab Results: No  results found for this or any previous visit (from the past 28 hour(s)). ? ?Blood Alcohol level:  ?Lab Results  ?Component Value Date  ? ETH <10 12/24/2021  ? ETH <10 11/15/2021  ? ? ?Metabolic Disorder Labs: ?Lab Results  ?Component Value Date  ? HGBA1C 5.2 11/17/2021  ? MPG 102.54 11/17/2021  ? MPG 105.41 08/05/2018  ? ?No results found for: PROLACTIN ?Lab Results  ?Component Value Date  ? CHOL 117 11/17/2021  ? TRIG 96 11/17/2021  ? HDL 28 (L) 11/17/2021  ? CHOLHDL 4.2 11/17/2021  ? VLDL 19 11/17/2021  ? [place] 70 11/17/2021  ? [place] 87 08/22/2019  ? ? ?Physical Findings: ?AIMS:  , ,  ,  ,    ?CIWA:    ?COWS:    ? ?Musculoskeletal: ?Strength & Muscle Tone: within normal limits ?Gait & Station: normal ?Patient leans: N/A ? ?Psychiatric Specialty Exam: ? ?Presentation  ?General Appearance: No data recorded ?Eye Contact:No data recorded ?Speech:No data recorded ?Speech Volume:No data recorded ?Handedness:No data recorded ? ?Mood and Affect  ?Mood:No data recorded ?Affect:No data recorded ? ?Thought Process  ?Thought Processes:No data recorded ?Descriptions of Associations:No data recorded ?Orientation:No data recorded ?Thought Content:No data recorded ?History of Schizophrenia/Schizoaffective disorder:No ? ?Duration of Psychotic Symptoms:No data recorded ?Hallucinations:No data recorded ?Ideas of Reference:No data recorded ?Suicidal Thoughts:No data recorded ?Homicidal Thoughts:No data recorded ? ?Sensorium  ?Memory:No data recorded ?Judgment:No data recorded ?Insight:No data recorded ? ?Executive Functions  ?Concentration:No data recorded ?Attention Span:No data recorded ?Recall:No data recorded ?[employer] [place] recorded ?Language:No data recorded ? ?Psychomotor Activity  ?Psychomotor Activity:No data recorded ? ?Assets  ?Assets:No data recorded ? ?Sleep   ?Sleep:No data recorded ? ? ?Physical Exam: ?Physical Exam ?Vitals and nursing note reviewed.  ?Constitutional:   ?   Appearance: Normal appearance. He is normal weight.  ?Neurological:  ?   General: No focal deficit pr

## 2022-01-19 NOTE — Progress Notes (Signed)
Patient slept for 8 hours.  No sleep medication given. ?

## 2022-01-19 NOTE — Plan of Care (Signed)
?  Problem: Communication ?Goal: STG - Patient will identify benefit of improved communication within 5 recreation therapy group sessions ?Description: STG - Patient will identify benefit of improved communication within 5 recreation therapy group sessions ?Outcome: Progressing ?  ?

## 2022-01-19 NOTE — BHH Group Notes (Signed)
[facility] Group Notes:  (Nursing/MHT/Case Management/Adjunct) ? ?Date:  01/19/2022  ?Time:  10:00 AM ? ?Type of Therapy:  Group Therapy ? ?Participation Level:  Active ? ?Participation Quality:  Appropriate ? ?Affect:  Appropriate ? ?Cognitive:  Appropriate ? ?Insight:  Appropriate ? ?Engagement in Group:  Engaged ? ?Modes of Intervention:  Discussion ? ?Summary of Progress/Problems: ?The pt attended group and participated in sharing and group activities. ?Juliette Alcide ?01/19/2022, 10:38 AM ?

## 2022-01-19 NOTE — Plan of Care (Addendum)
Patient presents A&Ox4 with sad and depressed affect.  Patient endorses SI stating, "I'm suppose to go home tomorrow and started having more anxiety and suicidal thoughts again.  I'm lonely."  Rates depression 4/10 and anxiety 7/10.  Emotional support given and verbal contract for safety obtained.  Denies AVH, HI or pain.  Pt compliant with all meds.  Trazodone given for sleep.  Patient spent most of evening in day room watching TV. Will continue with Q15 min safety checks. ? ?Problem: Education: ?Goal: Knowledge of [facility] Education information/materials will improve ?01/19/2022 2114 by Conard Novak, RN ?Outcome: Progressing ?01/19/2022 2114 by Conard Novak, RN ?Outcome: Progressing ?Goal: Emotional status will improve ?01/19/2022 2114 by Conard Novak, RN ?Outcome: Progressing ?01/19/2022 2114 by Conard Novak, RN ?Outcome: Progressing ?Goal: Mental status will improve ?01/19/2022 2114 by Conard Novak, RN ?Outcome: Progressing ?01/19/2022 2114 by Conard Novak, RN ?Outcome: Progressing ?  ?Problem: Activity: ?Goal: Interest or engagement in activities will improve ?01/19/2022 2114 by Conard Novak, RN ?Outcome: Progressing ?01/19/2022 2114 by Conard Novak, RN ?Outcome: Progressing ?Goal: Sleeping patterns will improve ?01/19/2022 2114 by Conard Novak, RN ?Outcome: Progressing ?01/19/2022 2114 by Conard Novak, RN ?Outcome: Progressing ?  ?

## 2022-01-19 NOTE — Group Note (Signed)
[place] LCSW Group Therapy Note ? ? ?Group Date: 01/19/2022 ?Start Time: 1400 ?End Time: 0932 ? ? ?Type of Therapy/Topic:  Group Therapy:  Balance in Life ? ?Participation Level:  Active  ? ?Description of Group:   ? This group will address the concept of balance and how it feels and looks when one is unbalanced. Patients will be encouraged to process areas in their lives that are out of balance, and identify reasons for remaining unbalanced. Facilitators will guide patients utilizing problem- solving interventions to address and correct the stressor making their life unbalanced. Understanding and applying boundaries will be explored and addressed for obtaining  and maintaining a balanced life. Patients will be encouraged to explore ways to assertively make their unbalanced needs known to significant others in their lives, using other group members and facilitator for support and feedback. ? ?Therapeutic Goals: ?Patient will identify two or more emotions or situations they have that consume much of in their lives. ?Patient will identify signs/triggers that life has become out of balance:  ?Patient will identify two ways to set boundaries in order to achieve balance in their lives:  ?Patient will demonstrate ability to communicate their needs through discussion and/or role plays ? ?Summary of Patient Progress: ? ? ? ?Patient was present for the entirety of the group session. Patient was an active listener and participated in the topic of discussion, provided helpful advice to others, and added nuance to topic of conversation. He stated that it is "difficult to make friends as you get older" but that he wants to spend more time with others to have more balance in life. He stated that "connecting one on one" is very important to him. Reducing his worry is also an important dynamic in having more balance.  ? ? ?Therapeutic Modalities:   ?Cognitive Behavioral Therapy ?Solution-Focused Therapy ?Assertiveness Training ? ? ?Homer Miller  A [place], LCSWA ?

## 2022-01-19 NOTE — Plan of Care (Signed)
Patient remain alert and oriented, calm and cooperative during assessment. Denies anxiety and depression. Denies SI, HI, AVH. Denies pain or discomfort at this time. Compliant with medications. Ate meals in the day room among peers with good appetite. Remain safe on the unit with Q 15 minute safety checks. ?Problem: Education: ?Goal: Knowledge of [facility] Education information/materials will improve ?Outcome: Progressing ?Goal: Emotional status will improve ?Outcome: Progressing ?Goal: Mental status will improve ?Outcome: Progressing ?Goal: Verbalization of understanding the information provided will improve ?Outcome: Progressing ?  ?Problem: Activity: ?Goal: Interest or engagement in activities will improve ?Outcome: Progressing ?Goal: Sleeping patterns will improve ?Outcome: Progressing ?  ?Problem: Coping: ?Goal: Ability to verbalize frustrations and anger appropriately will improve ?Outcome: Progressing ?Goal: Ability to demonstrate self-control will improve ?Outcome: Progressing ?  ?Problem: Health Behavior/Discharge Planning: ?Goal: Identification of resources available to assist in meeting health care needs will improve ?Outcome: Progressing ?Goal: Compliance with treatment plan for underlying cause of condition will improve ?Outcome: Progressing ?  ?Problem: Physical Regulation: ?Goal: Ability to maintain clinical measurements within normal limits will improve ?Outcome: Progressing ?  ?Problem: Safety: ?Goal: Periods of time without injury will increase ?Outcome: Progressing ?  ?Problem: Education: ?Goal: Utilization of techniques to improve thought processes will improve ?Outcome: Progressing ?Goal: Knowledge of the prescribed therapeutic regimen will improve ?Outcome: Progressing ?  ?Problem: Activity: ?Goal: Interest or engagement in leisure activities will improve ?Outcome: Progressing ?Goal: Imbalance in normal sleep/wake cycle will improve ?Outcome: Progressing ?  ?Problem: Coping: ?Goal:  Coping ability will improve ?Outcome: Progressing ?Goal: Will verbalize feelings ?Outcome: Progressing ?  ?Problem: Health Behavior/Discharge Planning: ?Goal: Ability to make decisions will improve ?Outcome: Progressing ?Goal: Compliance with therapeutic regimen will improve ?Outcome: Progressing ?  ?Problem: Role Relationship: ?Goal: Will demonstrate positive changes in social behaviors and relationships ?Outcome: Progressing ?  ?Problem: Safety: ?Goal: Ability to disclose and discuss suicidal ideas will improve ?Outcome: Progressing ?Goal: Ability to identify and utilize support systems that promote safety will improve ?Outcome: Progressing ?  ?Problem: Self-Concept: ?Goal: Will verbalize positive feelings about self ?Outcome: Progressing ?Goal: Level of anxiety will decrease ?Outcome: Progressing ?  ?Problem: Education: ?Goal: Ability to make informed decisions regarding treatment will improve ?Outcome: Progressing ?  ?Problem: Coping: ?Goal: Coping ability will improve ?Outcome: Progressing ?  ?Problem: Health Behavior/Discharge Planning: ?Goal: Identification of resources available to assist in meeting health care needs will improve ?Outcome: Progressing ?  ?Problem: Medication: ?Goal: Compliance with prescribed medication regimen will improve ?Outcome: Progressing ?  ?Problem: Self-Concept: ?Goal: Ability to disclose and discuss suicidal ideas will improve ?Outcome: Progressing ?Goal: Will verbalize positive feelings about self ?Outcome: Progressing ?  ?Problem: Education: ?Goal: Ability to state activities that reduce stress will improve ?Outcome: Progressing ?  ?Problem: Coping: ?Goal: Ability to identify and develop effective coping behavior will improve ?Outcome: Progressing ?  ?Problem: Self-Concept: ?Goal: Ability to identify factors that promote anxiety will improve ?Outcome: Progressing ?Goal: Level of anxiety will decrease ?Outcome: Progressing ?Goal: Ability to modify response to factors that promote  anxiety will improve ?Outcome: Progressing ?  ?

## 2022-01-19 NOTE — Progress Notes (Signed)
Recreation Therapy Notes ? ? ?Date: 01/19/2022 ?  ?Time: 1:20pm  ?  ?Location: Craft room   ?  ?Behavioral response: Appropriate ?  ?Intervention Topic:  Emotions  ?  ?Discussion/Intervention:  ?Group content on today was focused on emotions. The group identified what emotions are and why it is important to have emotions. Patients expressed some positive and negative emotions. Individuals gave some past experiences on how they normally dealt with emotions in the past. The group described some positive ways to deal with emotions in the future. Patients participated in the intervention ?The Situation? where individuals were given a chance to respond to certain situations involving their emotions.  ?Clinical Observations/Feedback: ?Patient came to group and was focused on what peers and staff had to say about emotions. Participant defined emotions as feelings.  He identified sadness and happiness as common emotions he feels. Individual was social with staff and peers while participating in the intervention.  ?Sharay Bellissimo LRT/CTRS  ?  ?  ?  ? ? ? ? ? ? ? ?Bular Hickok ?01/19/2022 2:20 PM ?

## 2022-01-20 DIAGNOSIS — F333 Major depressive disorder, recurrent, severe with psychotic symptoms: Secondary | ICD-10-CM | POA: Diagnosis not present

## 2022-01-20 LAB — PROTIME-INR
INR: 2.2 — ABNORMAL HIGH (ref 0.8–1.2)
Prothrombin Time: 24.6 seconds — ABNORMAL HIGH (ref 11.4–15.2)

## 2022-01-20 NOTE — Plan of Care (Signed)
Patient is alert and oriented times 4. Mood and affect sullen/flat. Patient lays in bed all day- gets up for meals. No c/o pain at this time. He denies SI, HI, and AVH. Endorses feelings of anxiety 10/10 and depression 5/10. States he did not sleep last night although the night nurse reports he slept well. Morning meds given whole by mouth W/O difficulty. Ate breakfast in day room- appetite good. Patient remains on unit with Q15 minute checks in place.  ? ? ? ?Problem: Education: ?Goal: Knowledge of  General Education information/materials will improve ?Outcome: Progressing ?Goal: Emotional status will improve ?Outcome: Not Progressing ?Goal: Mental status will improve ?Outcome: Not Progressing ?Goal: Verbalization of understanding the information provided will improve ?Outcome: Progressing ?  ?Problem: Activity: ?Goal: Interest or engagement in activities will improve ?Outcome: Progressing ?Goal: Sleeping patterns will improve ?Outcome: Progressing ?  ?Problem: Coping: ?Goal: Ability to verbalize frustrations and anger appropriately will improve ?Outcome: Progressing ?Goal: Ability to demonstrate self-control will improve ?Outcome: Progressing ?  ?Problem: Physical Regulation: ?Goal: Ability to maintain clinical measurements within normal limits will improve ?Outcome: Progressing ?  ?Problem: Health Behavior/Discharge Planning: ?Goal: Identification of resources available to assist in meeting health care needs will improve ?Outcome: Progressing ?Goal: Compliance with treatment plan for underlying cause of condition will improve ?Outcome: Progressing ?  ?Problem: Safety: ?Goal: Periods of time without injury will increase ?Outcome: Progressing ?  ?Problem: Education: ?Goal: Utilization of techniques to improve thought processes will improve ?Outcome: Progressing ?Goal: Knowledge of the prescribed therapeutic regimen will improve ?Outcome: Progressing ?  ?Problem: Activity: ?Goal: Interest or engagement in  leisure activities will improve ?Outcome: Progressing ?Goal: Imbalance in normal sleep/wake cycle will improve ?Outcome: Progressing ?  ?Problem: Coping: ?Goal: Coping ability will improve ?Outcome: Not Progressing ?Goal: Will verbalize feelings ?Outcome: Progressing ?  ?Problem: Self-Concept: ?Goal: Will verbalize positive feelings about self ?Outcome: Not Progressing ?Goal: Level of anxiety will decrease ?Outcome: Not Progressing ?  ?Problem: Medication: ?Goal: Compliance with prescribed medication regimen will improve ?Outcome: Progressing ?  ?

## 2022-01-20 NOTE — Progress Notes (Signed)
Recreation Therapy Notes ? ?Date: 01/20/2022 ?  ?Time: 1:30pm  ?  ?Location: Craft room   ?  ?Behavioral response: Appropriate ?  ?Intervention Topic:  Stress Management   ?  ?Discussion/Intervention:  ?Group content on today was focused on stress. The group defined stress and way to cope with stress. Participants expressed how they know when they are stresses out. Individuals described the different ways they have to cope with stress. The group stated reasons why it is important to cope with stress. Patient explained what good stress is and some examples. The group participated in the intervention ?Stress Management?. Individuals were separated into two group and answered questions related to stress.  ?  ?Clinical Observations/Feedback: ?Patient came to group and was focused on what peers and staff had to say about stress management. Patient identified deep breathing as a way he manages his stress. Individual was social with peers and staff while participating in the intervention.   ?Roniesha Hollingshead LRT/CTRS  ? ? ? ? ? ? ? ?[name] ?01/20/2022 2:24 PM ?

## 2022-01-20 NOTE — Progress Notes (Signed)
[facility] MD Progress Note ? ?01/20/2022 10:18 AM ?[name]  ?MRN:  [id number] ?Subjective: [name] is seen and examined today.  The nurses tell me that he slept well last night but he states that he did not sleep at all.  He has been set up with a sleep study at [facility] outpatient in which they will call him in a couple weeks.  He still pretty depressed.  He states that he is lonely at home.  His brother lives pretty close by.  He denies any suicidal ideation.  His mood has improved.  He is tolerating the medications and denies any side effects. ? ?Principal Problem: Acute on chronic renal insufficiency ?Diagnosis: Principal Problem: ?  Acute on chronic renal insufficiency ?Active Problems: ?  Atrial fibrillation ([place]) ?  Chronic systolic CHF (congestive heart failure) ([place]) ?  History of liver transplant ([place]) ?  History of kidney transplant ?  Chronic ITP (idiopathic thrombocytopenic purpura) ([facility]) ?  MDD (major depressive disorder), recurrent episode ([place]) ? ?Total Time spent with patient: 15 minutes ? ?Past Psychiatric History: [facility] ? ?Past Medical History:  ?Past Medical History:  ?Diagnosis Date  ? Atrial fibrillation ([place]) 08/2009  ? CAD (coronary artery disease)   ? s/p CABG -- 1992  ? Depression   ? ESRD (end stage renal disease) ([place])   ? HLD (hyperlipidemia)   ? HTN (hypertension)   ? Idiopathic thrombocytopenia ([facility])   ?  ?Past Surgical History:  ?Procedure Laterality Date  ? APPENDECTOMY    ? CORONARY ARTERY BYPASS GRAFT    ? FACIAL COSMETIC SURGERY    ? GALLBLADDER SURGERY    ? HERNIA REPAIR    ? KIDNEY TRANSPLANT    ? LEFT HEART CATH AND CORONARY ANGIOGRAPHY N/A 08/07/2018  ? Procedure: LEFT HEART CATH AND CORONARY ANGIOGRAPHY;  Surgeon: Sherren Mocha, MD;  Location: [place] [facility];  Service: Cardiovascular;  Laterality: N/A;  ? LIVER TRANSPLANT    ? PRESSURE SENSOR/CARDIOMEMS N/A 08/04/2021  ? Procedure: PRESSURE SENSOR/CARDIOMEMS;  Surgeon: Larey Dresser, MD;  Location: [place] [facility]  [facility];  Service: Cardiovascular;  Laterality: N/A;  ? RIGHT HEART CATH N/A 06/25/2021  ? Procedure: RIGHT HEART CATH;  Surgeon: Larey Dresser, MD;  Location: [place] [facility];  Service: Cardiovascular;  Laterality: N/A;  ? ?Family History:  ?Family History  ?Problem Relation Age of Onset  ? Atrial fibrillation Mother   ? Breast cancer Mother   ? Renal cancer Father   ? Hypertension Brother   ? Hyperlipidemia Brother   ? Other Brother   ?     stent   ? Renal Disease Maternal Grandmother   ? Stroke Maternal Grandmother   ? Heart attack Maternal Grandfather   ? Cancer Paternal Grandmother   ? Heart failure Paternal Grandfather   ? Emphysema Paternal Grandfather   ? Bronchiolitis Paternal Grandfather   ? ? ?Social History:  ?Social History  ? ?Substance and Sexual Activity  ?Alcohol Use No  ?   ?Social History  ? ?Substance and Sexual Activity  ?Drug Use No  ?  ?Social History  ? ?Socioeconomic History  ? Marital status: Single  ?  Spouse name: Not on file  ? Number of children: 0  ? Years of education: Not on file  ? Highest education level: Not on file  ?Occupational History  ? Occupation: retired  ?Tobacco Use  ? Smoking status: Never  ? Smokeless tobacco: Never  ?Vaping Use  ? Vaping  Use: Never used  ?Substance and Sexual Activity  ? Alcohol use: No  ? Drug use: No  ? Sexual activity: Not on file  ?Other Topics Concern  ? Not on file  ?Social History Narrative  ? Not on file  ? ?Social Determinants of Health  ? ?Financial Resource Strain: Low Risk   ? Difficulty of Paying Living Expenses: Not very hard  ?Food Insecurity: No Food Insecurity  ? Worried About Charity fundraiser in the Last Year: Never true  ? Ran Out of Food in the Last Year: Never true  ?Transportation Needs: No Transportation Needs  ? Lack of Transportation (Medical): No  ? Lack of Transportation (Non-Medical): No  ?Physical Activity: Not on file  ?Stress: Not on file  ?Social Connections: Not on file  ? ?Additional Social History:  ?  ?  ?  ?  ?   ?  ?  ?  ?  ?  ?  ? ?Sleep: Poor ? ?Appetite:  Fair ? ?Current Medications: ?Current Facility-Administered Medications  ?Medication Dose Route Frequency Provider Last Rate Last Admin  ? acetaminophen (TYLENOL) tablet 650 mg  650 mg Oral Q6H PRN Suella Broad, FNP   650 mg at 01/19/22 2340  ? alum & mag hydroxide-simeth (MAALOX/MYLANTA) 200-200-20 MG/5ML suspension 30 mL  30 mL Oral Q4H PRN Starkes-Perry, Gayland Curry, FNP      ? amiodarone (PACERONE) tablet 200 mg  200 mg Oral Daily Suella Broad, FNP   200 mg at 01/20/22 3888  ? atorvastatin (LIPITOR) tablet 20 mg  20 mg Oral QHS Suella Broad, FNP   20 mg at 01/19/22 2157  ? magnesium hydroxide (MILK OF MAGNESIA) suspension 30 mL  30 mL Oral Daily PRN Suella Broad, FNP      ? magnesium oxide (MAG-OX) tablet 400 mg  400 mg Oral BID Suella Broad, FNP   400 mg at 01/20/22 2800  ? melatonin tablet 10 mg  10 mg Oral QHS Parks Ranger, DO   10 mg at 01/19/22 2156  ? OLANZapine (ZYPREXA) tablet 15 mg  15 mg Oral QHS Parks Ranger, DO   15 mg at 01/19/22 2156  ? ondansetron (ZOFRAN) tablet 4 mg  4 mg Oral Q8H PRN He, [name], MD   4 mg at 12/27/21 0848  ? risperiDONE (RISPERDAL) tablet 0.5 mg  0.5 mg Oral [id number] Parks Ranger, DO   0.5 mg at 01/20/22 0818  ? tacrolimus (PROGRAF) capsule 0.5 mg  0.5 mg Oral [name], FNP   0.5 mg at 01/19/22 0913  ? tacrolimus (PROGRAF) capsule 1 mg  1 mg Oral [name], FNP   1 mg at 01/20/22 0919  ? traZODone (DESYREL) tablet 100 mg  100 mg Oral QHS Parks Ranger, DO   100 mg at 01/19/22 2340  ? venlafaxine XR (EFFEXOR-XR) 24 hr capsule 150 mg  150 mg Oral Q breakfast Parks Ranger, DO   150 mg at 01/20/22 3491  ? warfarin (COUMADIN) tablet 2 mg  2 mg Oral Once per day on Sun Tue Wed Thu Fri Sat Parks Ranger, DO   2 mg at 01/19/22 1801  ? warfarin (COUMADIN) tablet 4 mg  4 mg Oral Q Mon-1800  Suella Broad, FNP   4 mg at 01/18/22 1820  ? Warfarin - Physician Dosing Inpatient   Does not apply q1600 [name], [facility]   Given at 01/19/22 1801  ? ? ?  Lab Results:  ?Results for orders placed or performed during the hospital encounter of 12/25/21 (from the past 48 hour(s))  ?Protime-INR     Status: Abnormal  ? Collection Time: 01/20/22  6:46 AM  ?Result Value Ref Range  ? Prothrombin Time 24.6 (H) 11.4 - 15.2 seconds  ? INR 2.2 (H) 0.8 - 1.2  ?  Comment: (NOTE) ?INR goal varies based on device and disease states. ?Performed at [facility], [place], ?[place] [address] ?  ? ? ?Blood Alcohol level:  ?Lab Results  ?Component Value Date  ? ETH <10 12/24/2021  ? ETH <10 11/15/2021  ? ? ?Metabolic Disorder Labs: ?Lab Results  ?Component Value Date  ? HGBA1C 5.2 11/17/2021  ? MPG 102.54 11/17/2021  ? MPG 105.41 08/05/2018  ? ?No results found for: PROLACTIN ?Lab Results  ?Component Value Date  ? CHOL 117 11/17/2021  ? TRIG 96 11/17/2021  ? HDL 28 (L) 11/17/2021  ? CHOLHDL 4.2 11/17/2021  ? VLDL 19 11/17/2021  ? [place] 70 11/17/2021  ? [place] 87 08/22/2019  ? ? ?Physical Findings: ?AIMS:  , ,  ,  ,    ?CIWA:    ?COWS:    ? ?Musculoskeletal: ?Strength & Muscle Tone: within normal limits ?Gait & Station: normal ?Patient leans: N/A ? ?Psychiatric Specialty Exam: ? ?Presentation  ?General Appearance: No data recorded ?Eye Contact:No data recorded ?Speech:No data recorded ?Speech Volume:No data recorded ?Handedness:No data recorded ? ?Mood and Affect  ?Mood:No data recorded ?Affect:No data recorded ? ?Thought Process  ?Thought Processes:No data recorded ?Descriptions of Associations:No data recorded ?Orientation:No data recorded ?Thought Content:No data recorded ?History of Schizophrenia/Schizoaffective disorder:No ? ?Duration of Psychotic Symptoms:No data recorded ?Hallucinations:No data recorded ?Ideas of Reference:No data recorded ?Suicidal Thoughts:No data recorded ?Homicidal  Thoughts:No data recorded ? ?Sensorium  ?Memory:No data recorded ?Judgment:No data recorded ?Insight:No data recorded ? ?Executive Functions  ?Concentration:No data recorded ?Attention Span:No data recorded ?Re

## 2022-01-21 ENCOUNTER — Encounter: Payer: Self-pay | Admitting: Cardiology

## 2022-01-21 DIAGNOSIS — F333 Major depressive disorder, recurrent, severe with psychotic symptoms: Secondary | ICD-10-CM | POA: Diagnosis not present

## 2022-01-21 LAB — PROTIME-INR
INR: 2.5 — ABNORMAL HIGH (ref 0.8–1.2)
Prothrombin Time: 26.8 seconds — ABNORMAL HIGH (ref 11.4–15.2)

## 2022-01-21 MED ORDER — VENLAFAXINE HCL ER 150 MG PO CP24
150.0000 mg | ORAL_CAPSULE | Freq: Every day | ORAL | 3 refills | Status: AC
Start: 2022-01-22 — End: ?

## 2022-01-21 MED ORDER — TRAZODONE HCL 100 MG PO TABS: 100.0000 mg | ORAL_TABLET | Freq: Every day | ORAL | 3 refills | Status: AC

## 2022-01-21 MED ORDER — OLANZAPINE 15 MG PO TABS: 15.0000 mg | ORAL_TABLET | Freq: Every day | ORAL | 3 refills | Status: AC

## 2022-01-21 MED ORDER — MELATONIN 10 MG PO TABS: 10.0000 mg | ORAL_TABLET | Freq: Every day | ORAL | 3 refills | Status: AC

## 2022-01-21 MED ORDER — RISPERIDONE 0.5 MG PO TABS: 0.5000 mg | ORAL_TABLET | ORAL | 3 refills | Status: AC

## 2022-01-21 NOTE — Progress Notes (Signed)
[facility] MD Progress Note ? ?01/21/2022 11:22 AM ?[name]  ?MRN:  [id number] ?Subjective: [name] was seen today and was planning on leaving but his brother can pick him up so they have arranged for him to be discharged tomorrow.  He still complains of not sleeping and I told him he really needs to have that sleep study done.  It is in his discharge.  His mood and affect have improved and he does feel better.  He denies any suicidal ideation.  He is tolerating the medications.  Besides sleep, he does not have any issues. ? ?Principal Problem: Acute on chronic renal insufficiency ?Diagnosis: Principal Problem: ?  Acute on chronic renal insufficiency ?Active Problems: ?  Atrial fibrillation ([place]) ?  Chronic systolic CHF (congestive heart failure) ([place]) ?  History of liver transplant ([place]) ?  History of kidney transplant ?  Chronic ITP (idiopathic thrombocytopenic purpura) (HCC) ?  MDD (major depressive disorder), recurrent episode ([place]) ? ?Total Time spent with patient: 15 minutes ? ?Past [facility]: [facility] ? ?Past Medical History:  ?Past Medical History:  ?Diagnosis Date  ? Atrial fibrillation ([place]) 08/2009  ? CAD (coronary artery disease)   ? s/p CABG -- 1992  ? Depression   ? ESRD (end stage renal disease) ([place])   ? HLD (hyperlipidemia)   ? HTN (hypertension)   ? Idiopathic thrombocytopenia (HCC)   ?  ?Past Surgical History:  ?Procedure Laterality Date  ? APPENDECTOMY    ? CORONARY ARTERY BYPASS GRAFT    ? FACIAL COSMETIC SURGERY    ? GALLBLADDER SURGERY    ? HERNIA REPAIR    ? KIDNEY TRANSPLANT    ? LEFT HEART CATH AND CORONARY ANGIOGRAPHY N/A 08/07/2018  ? Procedure: LEFT HEART CATH AND CORONARY ANGIOGRAPHY;  Surgeon: Sherren Mocha, MD;  Location: [facility];  Service: Cardiovascular;  Laterality: N/A;  ? LIVER TRANSPLANT    ? PRESSURE SENSOR/CARDIOMEMS N/A 08/04/2021  ? Procedure: PRESSURE SENSOR/CARDIOMEMS;  Surgeon: Larey Dresser, MD;  Location: [facility];  Service:  Cardiovascular;  Laterality: N/A;  ? RIGHT HEART CATH N/A 06/25/2021  ? Procedure: RIGHT HEART CATH;  Surgeon: Larey Dresser, MD;  Location: [facility];  Service: Cardiovascular;  Laterality: N/A;  ? ?Family History:  ?Family History  ?Problem Relation Age of Onset  ? Atrial fibrillation Mother   ? Breast cancer Mother   ? Renal cancer Father   ? Hypertension Brother   ? Hyperlipidemia Brother   ? Other Brother   ?     stent   ? Renal Disease Maternal Grandmother   ? Stroke Maternal Grandmother   ? Heart attack Maternal Grandfather   ? Cancer Paternal Grandmother   ? Heart failure Paternal Grandfather   ? Emphysema Paternal Grandfather   ? Bronchiolitis Paternal Grandfather   ? ? ?Social History:  ?Social History  ? ?Substance and Sexual Activity  ?Alcohol Use No  ?   ?Social History  ? ?Substance and Sexual Activity  ?Drug Use No  ?  ?Social History  ? ?Socioeconomic History  ? Marital status: Single  ?  Spouse name: Not on file  ? Number of children: 0  ? Years of education: Not on file  ? Highest education level: Not on file  ?Occupational History  ? Occupation: retired  ?Tobacco Use  ? Smoking status: Never  ? Smokeless tobacco: Never  ?Vaping Use  ? Vaping Use: Never used  ?Substance and Sexual Activity  ?  Alcohol use: No  ? Drug use: No  ? Sexual activity: Not on file  ?Other Topics Concern  ? Not on file  ?Social History Narrative  ? Not on file  ? ?Social Determinants of Health  ? ?Financial Resource Strain: Low Risk   ? Difficulty of Paying Living Expenses: Not very hard  ?Food Insecurity: No Food Insecurity  ? Worried About Charity fundraiser in the Last Year: Never true  ? Ran Out of Food in the Last Year: Never true  ?Transportation Needs: No Transportation Needs  ? Lack of Transportation (Medical): No  ? Lack of Transportation (Non-Medical): No  ?Physical Activity: Not on file  ?Stress: Not on file  ?Social Connections: Not on file  ? ?Additional Social History:  ?  ?  ?  ?  ?  ?  ?  ?  ?  ?   ?  ? ?Sleep: Poor ? ?Appetite:  Fair ? ?Current Medications: ?Current Facility-Administered Medications  ?Medication Dose Route Frequency Provider Last Rate Last Admin  ? acetaminophen (TYLENOL) tablet 650 mg  650 mg Oral Q6H PRN Suella Broad, FNP   650 mg at 01/20/22 2210  ? alum & mag hydroxide-simeth (MAALOX/MYLANTA) 200-200-20 MG/5ML suspension 30 mL  30 mL Oral Q4H PRN Suella Broad, FNP      ? amiodarone (PACERONE) tablet 200 mg  200 mg Oral Daily Suella Broad, FNP   200 mg at 01/21/22 6073  ? atorvastatin (LIPITOR) tablet 20 mg  20 mg Oral QHS Suella Broad, FNP   20 mg at 01/20/22 2210  ? magnesium hydroxide (MILK OF MAGNESIA) suspension 30 mL  30 mL Oral Daily PRN Suella Broad, FNP      ? magnesium oxide (MAG-OX) tablet 400 mg  400 mg Oral BID Suella Broad, FNP   400 mg at 01/21/22 7106  ? melatonin tablet 10 mg  10 mg Oral QHS Parks Ranger, DO   10 mg at 01/20/22 2210  ? OLANZapine (ZYPREXA) tablet 15 mg  15 mg Oral QHS Parks Ranger, DO   15 mg at 01/20/22 2209  ? ondansetron (ZOFRAN) tablet 4 mg  4 mg Oral Q8H PRN He, [name], MD   4 mg at 12/27/21 0848  ? risperiDONE (RISPERDAL) tablet 0.5 mg  0.5 mg Oral [id number] Parks Ranger, DO   0.5 mg at 01/21/22 0805  ? tacrolimus (PROGRAF) capsule 0.5 mg  0.5 mg Oral [name], FNP   0.5 mg at 01/21/22 2694  ? tacrolimus (PROGRAF) capsule 1 mg  1 mg Oral [name], FNP   1 mg at 01/20/22 0919  ? traZODone (DESYREL) tablet 100 mg  100 mg Oral QHS Parks Ranger, DO   100 mg at 01/20/22 2210  ? venlafaxine XR (EFFEXOR-XR) 24 hr capsule 150 mg  150 mg Oral Q breakfast Parks Ranger, DO   150 mg at 01/21/22 8546  ? warfarin (COUMADIN) tablet 2 mg  2 mg Oral Once per day on Sun Tue Wed Thu Fri Sat Parks Ranger, DO   2 mg at 01/20/22 1724  ? warfarin (COUMADIN) tablet 4 mg  4 mg Oral Q Mon-1800 Suella Broad, FNP   4 mg at 01/18/22 1820  ? Warfarin - Physician Dosing Inpatient   Does not apply q1600 [name], [facility]   Given at 01/20/22 1724  ? ? ?Lab Results:  ?Results for orders placed or  performed during the hospital encounter of 12/25/21 (from the past 48 hour(s))  ?Protime-INR     Status: Abnormal  ? Collection Time: 01/20/22  6:46 AM  ?Result Value Ref Range  ? Prothrombin Time 24.6 (H) 11.4 - 15.2 seconds  ? INR 2.2 (H) 0.8 - 1.2  ?  Comment: (NOTE) ?INR goal varies based on device and disease states. ?Performed at [facility], [place], ?[place] [address] ?  ?Protime-INR     Status: Abnormal  ? Collection Time: 01/21/22  6:06 AM  ?Result Value Ref Range  ? Prothrombin Time 26.8 (H) 11.4 - 15.2 seconds  ? INR 2.5 (H) 0.8 - 1.2  ?  Comment: (NOTE) ?INR goal varies based on device and disease states. ?Performed at [facility], [place], ?[place] [address] ?  ? ? ?Blood Alcohol level:  ?Lab Results  ?Component Value Date  ? ETH <10 12/24/2021  ? ETH <10 11/15/2021  ? ? ?Metabolic Disorder Labs: ?Lab Results  ?Component Value Date  ? HGBA1C 5.2 11/17/2021  ? MPG 102.54 11/17/2021  ? MPG 105.41 08/05/2018  ? ?No results found for: PROLACTIN ?Lab Results  ?Component Value Date  ? CHOL 117 11/17/2021  ? TRIG 96 11/17/2021  ? HDL 28 (L) 11/17/2021  ? CHOLHDL 4.2 11/17/2021  ? VLDL 19 11/17/2021  ? [place] 70 11/17/2021  ? [place] 87 08/22/2019  ? ? ?Physical Findings: ?AIMS:  , ,  ,  ,    ?CIWA:    ?COWS:    ? ?Musculoskeletal: ?Strength & Muscle Tone: within normal limits ?Gait & Station: normal ?Patient leans: N/A ? ?Psychiatric Specialty Exam: ? ?Presentation  ?General Appearance: No data recorded ?Eye Contact:No data recorded ?Speech:No data recorded ?Speech Volume:No data recorded ?Handedness:No data recorded ? ?Mood and Affect  ?Mood:No data recorded ?Affect:No data recorded ? ?Thought Process  ?Thought Processes:No data recorded ?Descriptions of  Associations:No data recorded ?Orientation:No data recorded ?Thought Content:No data recorded ?History of Schizophrenia/Schizoaffective disorder:No ? ?Duration of Psychotic Symptoms:No data recorded ?Hallucinations:No data r

## 2022-01-21 NOTE — Progress Notes (Signed)
This encounter was created in error - please disregard.

## 2022-01-21 NOTE — Plan of Care (Signed)
Patient is alert and oriented times 4. Mood and affect sullen/flat. Patient lays in bed all day- gets up for meals. No c/o pain at this time. He denies SI, HI, and AVH. Endorses feelings of anxiety 10/10 and depression 5/10. States he did not sleep last night although the night nurse reports he slept well. Morning meds given whole by mouth W/O difficulty. Ate breakfast in day room- appetite good. Patient remains on unit with Q15 minute checks in place.  ? ? ?Problem: Education: ?Goal: Knowledge of [place] [facility] Education information/materials will improve ?Outcome: Progressing ?Goal: Emotional status will improve ?Outcome: Not Progressing ?Goal: Mental status will improve ?Outcome: Not Progressing ?Goal: Verbalization of understanding the information provided will improve ?Outcome: Progressing ?  ?Problem: Activity: ?Goal: Interest or engagement in activities will improve ?Outcome: Progressing ?Goal: Sleeping patterns will improve ?Outcome: Progressing ?  ?Problem: Coping: ?Goal: Ability to verbalize frustrations and anger appropriately will improve ?Outcome: Progressing ? ?Problem: Coping: ?Goal: Ability to demonstrate self-control will improve ?Outcome: Progressing ?  ?Problem: Health Behavior/Discharge Planning: ?Goal: Identification of resources available to assist in meeting health care needs will improve ?Outcome: Progressing ?Goal: Compliance with treatment plan for underlying cause of condition will improve ?Outcome: Progressing ?  ?Problem: Physical Regulation: ?Goal: Ability to maintain clinical measurements within normal limits will improve ?Outcome: Progressing ?  ?Problem: Safety: ?Goal: Periods of time without injury will increase ?Outcome: Progressing ?  ?Problem: Education: ?Goal: Utilization of techniques to improve thought processes will improve ?Outcome: Progressing ?Goal: Knowledge of the prescribed therapeutic regimen will improve ?Outcome: Progressing ?  ?Problem: Activity: ?Goal:  Interest or engagement in leisure activities will improve ?Outcome: Progressing ?Goal: Imbalance in normal sleep/wake cycle will improve ?Outcome: Progressing ?  ?Problem: Coping: ?Goal: Coping ability will improve ?Outcome: Progressing ?Goal: Will verbalize feelings ?Outcome: Progressing ?  ?Problem: Health Behavior/Discharge Planning: ?Goal: Ability to make decisions will improve ?Outcome: Progressing ?Goal: Compliance with therapeutic regimen will improve ?Outcome: Progressing ?  ?Problem: Role Relationship: ?Goal: Will demonstrate positive changes in social behaviors and relationships ?Outcome: Progressing ?  ?Problem: Safety: ?Goal: Ability to disclose and discuss suicidal ideas will improve ?Outcome: Progressing ?Goal: Ability to identify and utilize support systems that promote safety will improve ?Outcome: Progressing ?  ?Problem: Self-Concept: ?Goal: Will verbalize positive feelings about self ?Outcome: Progressing ?Goal: Level of anxiety will decrease ?Outcome: Progressing ?  ?Problem: Education: ?Goal: Ability to make informed decisions regarding treatment will improve ?Outcome: Progressing ?  ?Problem: Coping: ?Goal: Coping ability will improve ?Outcome: Progressing ?  ?  ?

## 2022-01-21 NOTE — Progress Notes (Signed)
Recreation Therapy Notes ? ?INPATIENT RECREATION TR PLAN ? ?Patient Details ?Name: [name] ?MRN: [id number] ?DOB: 1951/07/08 ?Today's Date: 01/21/2022 ? ?Rec Therapy Plan ?Is patient appropriate for Therapeutic Recreation?: Yes ?Treatment times per week: at least 3 ?Estimated Length of Stay: 5-7 days ?TR Treatment/Interventions: Group participation (Comment) ? ?Discharge Criteria ?Pt will be discharged from therapy if:: Discharged ?Treatment plan/goals/alternatives discussed and agreed upon by:: Patient/family ? ?Discharge Summary ?Short term goals set: Patient will identify benefit of improved communication within 5 recreation therapy group sessions ?Short term goals met: Complete ?Progress toward goals comments: Groups attended ?Which groups?: AAA/T, Wellness, Stress management, Goal setting, Communication, Social skills, Leisure education, Other (Comment) (Emotions, self-care, happiness, creative expressions, relaxation) ?Reason goals not met: N/A ?Therapeutic equipment acquired: N/A ?Reason patient discharged from therapy: Discharge from hospital ?Pt/family agrees with progress & goals achieved: Yes ?Date patient discharged from therapy: 01/21/22 ? ? ?Rosalina Dingwall ?01/21/2022, 12:38 PM ?

## 2022-01-21 NOTE — Plan of Care (Signed)
?  Problem: Communication Goal: STG - Patient will identify benefit of improved communication within 5 recreation therapy group sessions Description: STG - Patient will identify benefit of improved communication within 5 recreation therapy group sessions Outcome: Completed/Met   

## 2022-01-21 NOTE — [facility] Suicide Risk Assessment (Signed)
[facility] Discharge Suicide Risk Assessment ? ? ?Principal Problem: Acute on chronic renal insufficiency ?Discharge Diagnoses: Principal Problem: ?  Acute on chronic renal insufficiency ?Active Problems: ?  Atrial fibrillation ([place]) ?  Chronic systolic CHF (congestive heart failure) ([place]) ?  History of liver transplant ([place]) ?  History of kidney transplant ?  Chronic ITP (idiopathic thrombocytopenic purpura) (HCC) ?  MDD (major depressive disorder), recurrent episode ([place]) ? ? ?Total Time spent with patient: 1 hour ? ?Musculoskeletal: ?Strength & Muscle Tone: within normal limits ?Gait & Station: normal ?Patient leans: N/A ? ?Psychiatric Specialty Exam ? ?Presentation  ?General Appearance: No data recorded ?Eye Contact:No data recorded ?Speech:No data recorded ?Speech Volume:No data recorded ?Handedness:No data recorded ? ?Mood and Affect  ?Mood:No data recorded ?Duration of Depression Symptoms: Greater than two weeks ? ?Affect:No data recorded ? ?Thought Process  ?Thought Processes:No data recorded ?Descriptions of Associations:No data recorded ?Orientation:No data recorded ?Thought Content:No data recorded ?History of Schizophrenia/Schizoaffective disorder:No ? ?Duration of Psychotic Symptoms:No data recorded ?Hallucinations:No data recorded ?Ideas of Reference:No data recorded ?Suicidal Thoughts:No data recorded ?Homicidal Thoughts:No data recorded ? ?Sensorium  ?Memory:No data recorded ?Judgment:No data recorded ?Insight:No data recorded ? ?Executive Functions  ?Concentration:No data recorded ?Attention Span:No data recorded ?Recall:No data recorded ?[employer] [place] recorded ?Language:No data recorded ? ?Psychomotor Activity  ?Psychomotor Activity:No data recorded ? ?Assets  ?Assets:No data recorded ? ?Sleep  ?Sleep:No data recorded ? ? ?Blood pressure 105/73, pulse 76, temperature 97.6 ?F (36.4 ?C), temperature source Oral, resp. rate 18, height '5\' 10"'$  (1.778 m), weight 81.2 kg, SpO2 95 %. Body mass index  is 25.69 kg/m?. ? ?Mental Status Per Nursing Assessment::   ?On Admission:  Suicidal ideation indicated by patient ? ?Demographic Factors:  ?Male, Age 35 or older, Caucasian, and Living alone ? ?Loss Factors: ?Decline in physical health ? ?Historical Factors: ?NA ? ?Risk Reduction Factors:   ?Positive social support, Positive therapeutic relationship, and Positive coping skills or problem solving skills ? ?Continued Clinical Symptoms:  ?Depression:   Anhedonia ?Insomnia ?Medical Diagnoses and Treatments/Surgeries ? ?Cognitive Features That Contribute To Risk:  ?None   ? ?Suicide Risk:  ?Mild:  Suicidal ideation of limited frequency, intensity, duration, and specificity.  There are no identifiable plans, no associated intent, mild dysphoria and related symptoms, good self-control (both objective and subjective assessment), few other risk factors, and identifiable protective factors, including available and accessible social support. ? ? Follow-up Information   ? ? Inc, [facility] Follow up on 01/22/2022.   ?Why: You have an appointment scheduled for follow up on Friday March 17th at 12pm. Thanks! ?Contact information: ?[address] ?[place] [address] ?[phone] ? ? ?  ?  ? ? [facility] Follow up.   ?Why: Your referral information has been sent. The office will contact you in two weeks to schedule your appointment for a sleep study. Thanks! ?Contact information: ?[address] ?[place],  [place]  [address] ? ?  ?  ? ?  ?  ? ?  ? ? ?Plan Of Care/Follow-up recommendations: [facility] ? ? ?[place] Ranger, DO ?01/21/2022, 9:51 AM ?

## 2022-01-21 NOTE — Progress Notes (Signed)
Patient is observed sitting in the dayroom with minimal interaction with others. Pt denies HI and AVH. He reported feelings of SI but verbally contracts to safety on the unit. Pt rated anxiety 7/10 and depression 5/10. He stated that he is anxious about "sleep". He remains safe on the unit at this time. Q 15 min safety checks in place.  ?

## 2022-01-21 NOTE — Progress Notes (Signed)
Recreation Therapy Notes ? ?Date: 01/21/2022 ? ?Time: 10:35 am ? ?Location: Day room    ? ?Behavioral response: Appropriate  ? ?Intervention Topic: Animal Assisted Therapy  ? ?Discussion/Intervention:  ?Animal Assisted Therapy took place today during group.  Animal Assisted Therapy is the planned inclusion of an animal in a patient's treatment plan. The patients were able to engage in therapy with an animal during group. Participants were educated on what a service dog is and the different between a support dog and a service dog. Patient were informed on how animal needs are like a person needs. Individuals were enlightened on the process to get a service animal or support animal. Patients got the opportunity to pet the animal and were offered emotional support from the animal and staff.  ?Clinical Observations/Feedback:  ?Patient came to group and was on topic and was focused on what peers and staff had to say. Participant shared their experiences and history with animals. Individual was social with peers, staff and animal while participating in group.  ?[name] LRT/CTRS  ? ? ? ? ? ? ? ?[name] ?01/21/2022 12:21 PM ?
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
## 2022-01-22 NOTE — Progress Notes (Signed)
Patient remain alert and oriented X4. Calm and cooperative during assessment. Report sleeping part of the shift. Verbalized anxiety and depression of 3/10. Denies SI, HI, AVH. Denies pain or discomfort at this time. Compliant with medications. Ate breakfast in the day room among peers with good appetite. Remain safe on the unit with Q15 minute safety check. ?

## 2022-01-22 NOTE — Progress Notes (Signed)
Patient is observed sitting in the dayroom at the beginning of the shift. He says that he had a fair day. He rated his anxiety a 5/10 and depression 3/10. He stated that his appetite has been fair. He also denies SI, HI, and AVH at this time. ? ?Pt is safe on the unit with q 15 minute safety checks in place.  ?

## 2022-01-22 NOTE — Progress Notes (Signed)
?  [facility] Adult Case Management Discharge Plan : ? ?Will you be returning to the same living situation after discharge:  Yes,  Patient to return to place of residence.  ?At discharge, do you have transportation home?: Yes,  Patient's brother to assist with transportation.  ?Do you have the ability to pay for your medications: Yes,  Medicare A and B Patient has signed the [facility] transportation waiver which was emailed to transportation office.  ? ?Release of information consent forms completed and in the chart;  Patient's signature needed at discharge. ? ?Patient to Follow up at: ? Follow-up Information   ? ? [facility] Follow up on 01/25/2022.   ?Why: You have an appointment scheduled for follow up on Monday March 20th at 12pm. Thanks! ?Contact information: ?[address] ?[place] [address] ?[phone] ? ? ?  ?  ? ? [facility] Follow up.   ?Why: Your referral information has been sent. The office will contact you in two weeks to schedule your appointment for a sleep study. Thanks! ?Contact information: ?[address] ?[place],  [place]  [address] ?Phone: [phone] ? ?  ?  ? ?  ?  ? ?  ? ?Next level of care provider has access to [place] [facility] ? ?Safety Planning and Suicide Prevention discussed: Yes,  SPE completed with patient and Kayshaun Polanco, sister in-law. ?  ?Has patient been referred to the Quitline?: N/A patient is not a smoker ? ?Patient has been referred for addiction treatment: N/A Patient denies active substance use, UDS negative for all substances, BAC <10 ? ?Durenda Hurt, LCSWA ?01/22/2022, 8:29 AM ?

## 2022-01-22 NOTE — Care Management Important Message (Signed)
Important Message ? ?Patient Details  ?Name: [name] ?MRN: [id number] ?Date of Birth: 02-28-1951 ? ? ?Medicare Important Message Given:  Yes ? ?Patient informed of right to appeal discharge, provided phone number to [facility]. Patient expressed no interest in appealing discharge at this time. CSW will continue to monitor situation. ? ? ?Durenda Hurt, LCSWA ?01/22/2022, 9:18 AM ?

## 2022-01-22 NOTE — [facility] Group Notes (Signed)
[facility] Group Notes:  (Nursing/MHT/Case Management/Adjunct) ? ?Date:  01/22/2022  ?Time:  10:00 AM ? ?Type of Therapy:  Psychoeducational Skills ? ?Participation Level:  Did Not Attend ? ?Participation Quality:   N/A ? ?Affect:   N/A ? ?Cognitive:   N/A ? ?Insight:  None ? ?Engagement in Group:   N/A ? ?Modes of Intervention:   N/A ? ?Summary of Progress/Problems: ?Pt did not attend group. He stated he was coming but continued to lay in bed. ?Juliette Alcide ?01/22/2022, 11:08 AM ?

## 2022-01-22 NOTE — Progress Notes (Signed)
Reviewed discharge instructions with patient including follow up appointment with provider, medication and prescriptions. Questions answered and understanding verbalized.  Discharge packet given.  All belongings returned to patient after verification completed by staff.   ? ?Patient escorted by staff off unit at this time  in stable condition without complaint. ?

## 2022-01-26 ENCOUNTER — Telehealth: Payer: Self-pay | Admitting: *Deleted

## 2022-01-26 NOTE — Telephone Encounter (Signed)
Called pt since he has been discharged from the Hospital and needs to have INR checked. There was no answer and voicemail is full, so unable to leave a message. Will try him back at a later time.  ?

## 2022-01-29 ENCOUNTER — Telehealth: Payer: Self-pay

## 2022-01-29 LAB — PROTIME-INR: INR: 4.2 — AB (ref 0.80–1.20)

## 2022-01-29 NOTE — Telephone Encounter (Signed)
Have made multiple attempts to contact pt, but his phone goes straight to vm and the mailbox is full. ?Unable to leave message. ?Sent MyChart message, as well.  ?

## 2022-01-29 NOTE — Telephone Encounter (Signed)
Pt left message on our vm that he has an INR reading for [place]. ?Attempted to call pt back w/in 1 min of call, but no answer and vm is full. ?

## 2022-02-01 NOTE — Telephone Encounter (Signed)
Received INR reading from [facility] for pt's INR reading on Friday, 01/29/22 of 4.2. ?Pt is currently admitted at ED @ [facility] and looks like he will be admitted to SNF.  ?INR yesterday while admitted was 3.3, so will not dose for Friday's reading.  ?

## 2022-02-02 ENCOUNTER — Ambulatory Visit (HOSPITAL_COMMUNITY): Payer: Medicare Other | Admitting: Licensed Clinical Social Worker

## 2022-02-03 ENCOUNTER — Telehealth: Payer: Self-pay

## 2022-02-03 NOTE — Telephone Encounter (Signed)
Received phone call from patient stating he had been hospitalized and now in rehab. Called [facility] and spoke with [place]. Pt's account placed on hold until pt is discharge from rehab.  ?

## 2022-02-14 IMAGING — DX DG CHEST 1V PORT
1 series · 1 of 1 positions shown · non-contrast
Comparison: Chest radiograph 08/29/2020

CLINICAL DATA: Shortness of breath

EXAM:
PORTABLE CHEST 1 VIEW

[chest]
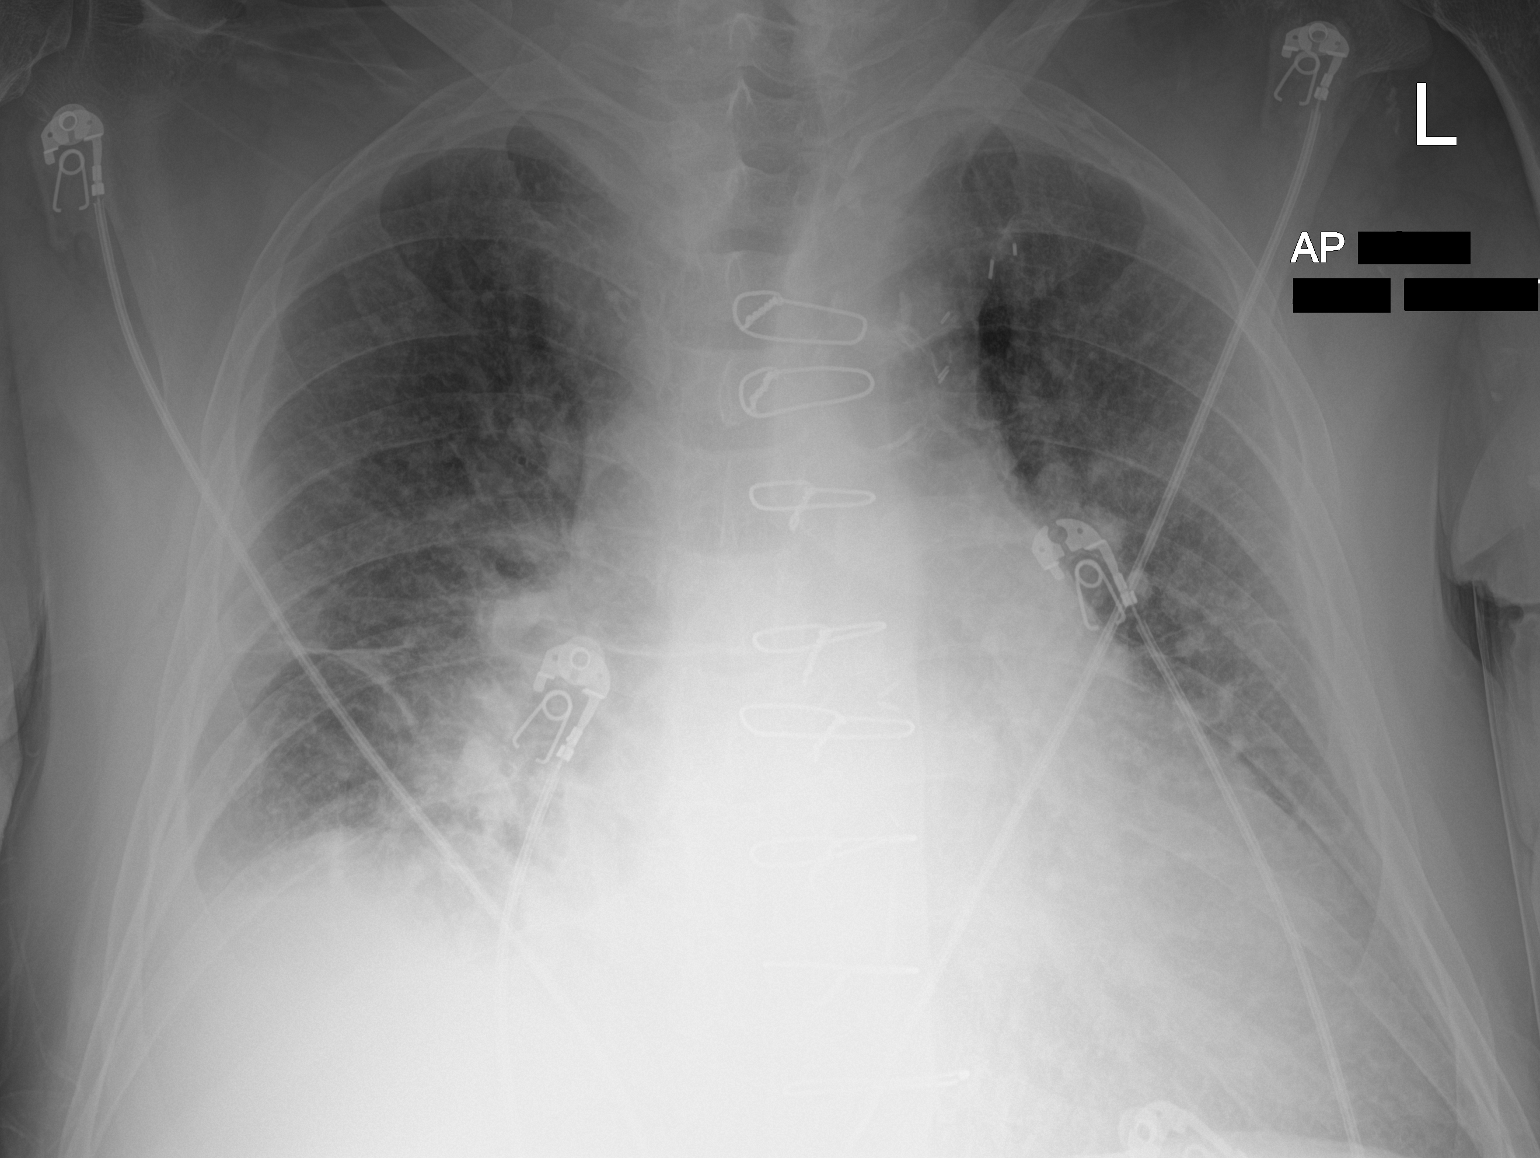

[1 of 1 positions shown; findings below may reference images not displayed]

FINDINGS: The heart is significantly enlarged, similar to the prior study. The
mediastinal contours are stable. Median sternotomy wires and
mediastinal surgical clips are stable.

There are increased interstitial markings bilaterally. There are
patchy opacities in the right base with a small right pleural
effusion. The left costophrenic angle is cut off; a left pleural
effusion cannot be excluded. The left upper lung is well aerated.
There is no pneumothorax.

The bones are unremarkable.
IMPRESSION: 1. Small right pleural effusion with adjacent airspace disease which
may reflect atelectasis or pneumonia. Recommend follow-up
radiographs in 6-8 weeks to assess for resolution.
2. Cardiomegaly with mild pulmonary interstitial edema.
3. A left pleural effusion cannot be excluded as the costophrenic
angle is not included in the field of view.

## 2022-02-17 IMAGING — NM NM SCAN TUMOR LOCALIZE WITH SPECT
2 series · 12 of 12 positions shown · non-contrast
Comparison: none

CLINICAL DATA: HEART FAILURE. CONCERN FOR CARDIAC AMYLOIDOSIS.

EXAM:
NUCLEAR MEDICINE TUMOR LOCALIZATION. PYP CARDIAC AMYLOIDOSIS SCAN
WITH SPECT
TECHNIQUE: Following intravenous administration of radiopharmaceutical,
anterior planar images of the chest were obtained. Regions of
interest were placed on the heart and contralateral chest wall for
quantitative assessment. Additional SPECT imaging of the chest was
obtained.
RADIOPHARMACEUTICALS:  20.8 mCi TECHNETIUM 99 PYROPHOSPHATE

[Series 1: spect - (id)_(id)_cor · 4.1mm · 4.14mm/px · 6 of 128 frames shown]
[frame 11/128]
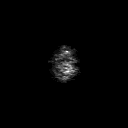
[frame 32/128]
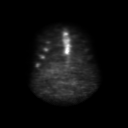
[frame 54/128]
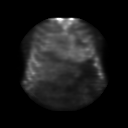
[frame 75/128]
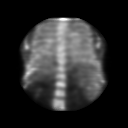
[frame 96/128]
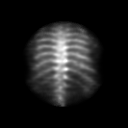
[frame 118/128]
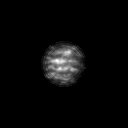

[Series 1: spect - (id)_(id)_tra · 4.1mm · 4.14mm/px · 6 of 128 frames shown]
[frame 11/128]
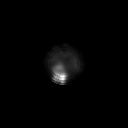
[frame 32/128]
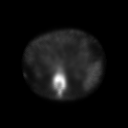
[frame 54/128]
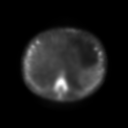
[frame 75/128]
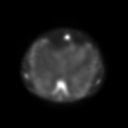
[frame 96/128]
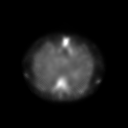
[frame 118/128]
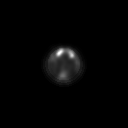

[12 of 12 positions shown; findings below may reference images not displayed]

FINDINGS: Planar Visual assessment:

Anterior planar imaging demonstrates radiotracer uptake within the
heart less than than uptake within the adjacent ribs (Grade 1).

Quantitative assessment :

Quantitative assessment of the cardiac uptake compared to the
contralateral chest wall is equal to (H/CL = 1.02).

SPECT assessment: SPECT imaging of the chest demonstrates clear
radiotracer accumulation within the LEFT ventricle.
IMPRESSION: Visual and quantitative assessment (grade 1, H/CLL equal 1.0) are
equivocal for transthyretin amyloidosis.

## 2022-02-21 ENCOUNTER — Encounter (HOSPITAL_BASED_OUTPATIENT_CLINIC_OR_DEPARTMENT_OTHER): Payer: Medicare Other | Admitting: Internal Medicine

## 2022-03-04 ENCOUNTER — Encounter (HOSPITAL_COMMUNITY): Payer: Medicare Other

## 2022-03-04 ENCOUNTER — Encounter (HOSPITAL_COMMUNITY): Payer: Self-pay

## 2022-04-19 ENCOUNTER — Encounter: Payer: Self-pay | Admitting: Cardiology

## 2022-04-19 DIAGNOSIS — R079 Chest pain, unspecified: Secondary | ICD-10-CM | POA: Diagnosis not present

## 2022-04-19 DIAGNOSIS — I4891 Unspecified atrial fibrillation: Secondary | ICD-10-CM | POA: Diagnosis not present

## 2022-04-19 DIAGNOSIS — Z940 Kidney transplant status: Secondary | ICD-10-CM | POA: Diagnosis not present

## 2022-04-19 DIAGNOSIS — I340 Nonrheumatic mitral (valve) insufficiency: Secondary | ICD-10-CM | POA: Diagnosis not present

## 2022-04-19 DIAGNOSIS — I361 Nonrheumatic tricuspid (valve) insufficiency: Secondary | ICD-10-CM | POA: Diagnosis not present

## 2022-04-20 DIAGNOSIS — Z940 Kidney transplant status: Secondary | ICD-10-CM | POA: Diagnosis not present

## 2022-04-20 DIAGNOSIS — I4891 Unspecified atrial fibrillation: Secondary | ICD-10-CM | POA: Diagnosis not present

## 2022-04-20 DIAGNOSIS — R079 Chest pain, unspecified: Secondary | ICD-10-CM | POA: Diagnosis not present

## 2022-06-14 ENCOUNTER — Telehealth (HOSPITAL_COMMUNITY): Payer: Self-pay | Admitting: Surgery

## 2022-06-14 NOTE — Telephone Encounter (Signed)
Patient calling in attempt to schedule an appt.  I added him to the schedule August 30 at 1:30pm.

## 2022-07-01 ENCOUNTER — Telehealth: Payer: Self-pay

## 2022-07-01 NOTE — Telephone Encounter (Signed)
Called and confirmed INR is managed by [facility]

## 2022-07-07 ENCOUNTER — Encounter (HOSPITAL_COMMUNITY): Payer: Medicare Other

## 2022-07-16 ENCOUNTER — Ambulatory Visit: Payer: Medicare Other | Admitting: Oncology

## 2022-07-16 ENCOUNTER — Inpatient Hospital Stay: Payer: Medicare Other | Attending: Family Medicine

## 2022-07-16 NOTE — Progress Notes (Deleted)
[place]  [address] [place],  [place]  [address] [phone]  Clinic Day:  07/16/2022  Referring physician: [name]., MD  This document serves as a record of services personally performed by Hosie Poisson, MD. It was created on their behalf by [facility], a trained medical scribe. The creation of this record is based on the scribe's personal observations and the provider's statements to them.  CHIEF COMPLAINT:  CC: Chronic ITP  Current Treatment:  Surveillance   HISTORY OF PRESENT ILLNESS:  [name] is a 71 y.o. male with multiple medical comorbidities.  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  He has never required a splenectomy.  We began seeing him in December 2003, because of leukopenia and thrombocytopenia.  His platelet count fluctuates up and down, but has never been in a dangerous range.  He has a history of both liver and kidney transplantation and immunosuppressive drugs.  He is on tacrolimus 0.5 mg twice daily, as well as prednisone.  He has also had B12 deficiency in the past, but currently is off injections and taking oral B12.  He was also found to have a monoclonal gammopathy of uncertain significance, IgG kappa, in 2011, which  has remained stable.  He has had osteopenia and had been on Reclast yearly, with the last dose in June 2017.  This was discontinued when his bone density normalized.  Repeat bone density in May 2017 revealed osteopenia with a T-score of -1.5 in the femur and a T-score of -1.3 in the radius.  However, he had dental extractions and did not heal well, which the dentist felt was related to the previous Reclast, so this was not resumed.    He was hospitalized in February 2019 at [facility] with shortness of breath and fever and problems with fluid overload.  He had been on furosemide 40 mg daily.  He was referred to a pulmonologist at [facility].  He lost his mother  in April 2019 and they were very close.  At his visit in May, he had new leukopenia.  B12, folate and CMV titers were normal.  The leukopenia resolved at his visit in November and has not recurred.  He was hospitalized again in September 2019 with pneumonia and ventricular tachycardia.  CT imaging in the emergency department did not reveal any acute findings there was splenomegaly with a stable 11 mm hypodensity, which is felt to represent a cyst or hemangioma.  On review of his CT scan from April 2019 in the emergency department, the spleen did appear enlarged at that time as well.  He continues to follow with cardiologist, Dr. [name] regularly.  He states he is up-to-date on screening colonoscopy.  He continues to see his nephrologist on a regular basis and they have been concerned about the fact that his creatinine now runs 1.5-2, when it used to run less than that.  INTERVAL HISTORY:  [name] is here for annual follow up and states that he is now being followed by the [facility] at [facility].  He was hospitalized twice within a month, once at [facility] and once at [facility], since his last visit. Last ECHO from August revealed an EF between 45-50%. He notes shortness of breath, but otherwise feels well. Platelet count is fairly stable at 75,000, and white count and platelets are normal.  Chemistries are unremarkable except for a creatinine of 2.0, previously 2.1, and a bilirubin of  1.8. His  appetite is good, and he has lost 4 pounds since his last visit.  He denies fever, chills or other signs of infection.  He denies nausea, vomiting, bowel issues, or abdominal pain.  He denies sore throat, cough or chest pain.  REVIEW OF SYSTEMS:  Review of Systems  Constitutional: Negative.  Negative for appetite change, chills, fatigue, fever and unexpected weight change.  HENT:  Negative.    Eyes: Negative.   Respiratory:  Negative for chest tightness, cough, hemoptysis and wheezing.   Cardiovascular:  Negative.  Negative for chest pain, leg swelling and palpitations.  Gastrointestinal:  Negative for abdominal pain, blood in stool, constipation, diarrhea, nausea and vomiting. Abdominal distention: and swelling. Endocrine: Negative.   Genitourinary: Negative.  Negative for difficulty urinating, dysuria, frequency and hematuria.   Musculoskeletal: Negative.  Negative for arthralgias, back pain, flank pain, gait problem and myalgias.  Skin: Negative.   Neurological: Negative.  Negative for dizziness, extremity weakness, gait problem, headaches, light-headedness, numbness, seizures and speech difficulty.  Hematological: Negative.   Psychiatric/Behavioral: Negative.  Negative for depression and sleep disturbance. The patient is not nervous/anxious.   All other systems reviewed and are negative.   VITALS:  There were no vitals taken for this visit.  Wt Readings from Last 3 Encounters:  12/24/21 182 lb 1.6 oz (82.6 kg)  11/15/21 186 lb (84.4 kg)  10/05/21 186 lb 12.8 oz (84.7 kg)    There is no height or weight on file to calculate BMI.  Performance status (ECOG): 1 - Symptomatic but completely ambulatory  PHYSICAL EXAM:  Physical Exam Constitutional:      General: He is not in acute distress.    Appearance: Normal appearance. He is normal weight.  HENT:     Head: Normocephalic and atraumatic.  Eyes:     General: No scleral icterus.    Extraocular Movements: Extraocular movements intact.     Conjunctiva/sclera: Conjunctivae normal.     Pupils: Pupils are equal, round, and reactive to light.  Cardiovascular:     Rate and Rhythm: Normal rate and regular rhythm.     Pulses: Normal pulses.     Heart sounds: Normal heart sounds. No murmur heard.    No friction rub. No gallop.  Pulmonary:     Effort: Pulmonary effort is normal. No respiratory distress.     Breath sounds: Normal breath sounds.  Abdominal:     General: Bowel sounds are normal. There is no distension.     Palpations:  Abdomen is soft. There is no hepatomegaly, splenomegaly or mass.     Tenderness: There is no abdominal tenderness.     Comments: He has mild to moderate ascites.  He has multiple scars of the abdomen.  Musculoskeletal:        General: Normal range of motion.     Cervical back: Normal range of motion and neck supple.     Right lower leg: No edema.     Left lower leg: No edema.  Lymphadenopathy:     Cervical: No cervical adenopathy.  Skin:    General: Skin is warm and dry.  Neurological:     General: No focal deficit present.     Mental Status: He is alert and oriented to person, place, and time. Mental status is at baseline.  Psychiatric:        Mood and Affect: Mood normal.        Behavior: Behavior normal.        Thought Content:  Thought content normal.        Judgment: Judgment normal.    LABS:      Latest Ref Rng & Units 01/17/2022    6:21 AM 01/10/2022    9:02 AM 01/08/2022    6:23 AM  CBC  WBC 4.0 - 10.5 K/uL 3.7  3.3  3.2   Hemoglobin 13.0 - 17.0 g/dL 12.7  13.8  14.5   Hematocrit 39.0 - 52.0 % 38.9  42.1  44.1   Platelets 150 - 400 K/uL 96  90  92       Latest Ref Rng & Units 01/17/2022    6:21 AM 01/13/2022    7:51 PM 01/11/2022    6:13 AM  CMP  Glucose 70 - 99 mg/dL 106  109  92   BUN 8 - 23 mg/dL '27  27  22   '$ Creatinine 0.61 - 1.24 mg/dL 2.32  2.38  2.60   Sodium 135 - 145 mmol/L 130  131  131   Potassium 3.5 - 5.1 mmol/L 4.4  4.7  4.4   Chloride 98 - 111 mmol/L 100  98  98   CO2 22 - 32 mmol/L '25  26  29   '$ Calcium 8.9 - 10.3 mg/dL 8.8  9.2  9.2      No results found for: "CEA1", "CEA" / No results found for: "CEA1", "CEA" Lab Results  Component Value Date   PSA1 0.5 08/22/2019   No results found for: "XHB716" No results found for: "CAN125"  Lab Results  Component Value Date   TOTALPROTELP 6.3 07/17/2021   ALBUMINELP 3.5 07/17/2021   A1GS 0.2 07/17/2021   A2GS 0.6 07/17/2021   BETS 0.7 07/17/2021   GAMS 1.2 07/17/2021   MSPIKE 0.7 (H) 07/17/2021    SPEI Comment 07/17/2021   No results found for: "TIBC", "FERRITIN", "IRONPCTSAT" No results found for: "LDH"  STUDIES:  No results found.    HISTORY:   Allergies:  Allergies  Allergen Reactions  . Other Anaphylaxis    Spider venom  . Grapefruit Concentrate Other (See Comments)    Pt has been told not to eat grapefruit  . Haloperidol Other (See Comments)    Caused the patient to be overly-sedated  . Nsaids Other (See Comments)    Cannot take due to liver transplant  . Rifampin Other (See Comments)    Caused flushing  . Triazolam Other (See Comments)    Caused the patient to be overly-sedated    Current Medications: Current Outpatient Medications  Medication Sig Dispense Refill  . allopurinol (ZYLOPRIM) 100 MG tablet Take 100 mg by mouth daily.    [name] amiodarone (PACERONE) 200 MG tablet Take 1 tablet (200 mg total) by mouth daily. 30 tablet 5  . atorvastatin (LIPITOR) 40 MG tablet Take 1 tablet (40 mg total) by mouth at bedtime. (Patient taking differently: Take 20 mg by mouth at bedtime.) 90 tablet 3  . calcitRIOL (ROCALTROL) 0.25 MCG capsule Take 0.25 mcg by mouth daily.    . Cholecalciferol (VITAMIN D3) 50 MCG (2000 UT) TABS Take 2,000 Units by mouth daily.    . Cyanocobalamin (VITAMIN B12) 1000 MCG TBCR Take 1,000 mcg by mouth daily.    . dapagliflozin propanediol (FARXIGA) 10 MG TABS tablet Take 1 tablet (10 mg total) by mouth daily. 30 tablet 6  . EPINEPHrine (EPI-PEN) 0.3 mg/0.3 mL DEVI Inject 0.3 mg into the muscle once as needed (severe allergic reaction). (Patient not taking: Reported on 12/24/2021)    .  ferrous sulfate 325 (65 FE) MG tablet Take 325 mg by mouth daily with breakfast.    . fish oil-omega-3 fatty acids 1000 MG capsule Take 1 g by mouth 2 (two) times daily.    [name] MAGNESIUM-OXIDE 400 (240 Mg) MG tablet Take 400 mg by mouth 2 (two) times daily.    . melatonin 10 MG TABS Take 10 mg by mouth at bedtime. 30 tablet 3  . nitroGLYCERIN (NITROSTAT) 0.4 MG SL tablet  Place 0.4 mg under the tongue every 5 (five) minutes as needed for chest pain. (Patient not taking: Reported on 12/24/2021)    . OLANZapine (ZYPREXA) 15 MG tablet Take 1 tablet (15 mg total) by mouth at bedtime. 30 tablet 3  . potassium chloride SA (KLOR-CON) 20 MEQ tablet Take 2 tablets (40 mEq total) by mouth 3 (three) times daily. 90 tablet 6  . risperiDONE (RISPERDAL) 0.5 MG tablet Take 1 tablet (0.5 mg total) by mouth 2 (two) times daily at 8 am and 4 pm. 60 tablet 3  . tacrolimus (PROGRAF) 0.5 MG capsule Take 0.5 mg by mouth See admin instructions. Takes 0.5 mg  tablet by mouth one day and 1 mg tablets next day (1 and 2 tablets on alternate days)    . torsemide (DEMADEX) 20 MG tablet Take 5 tablets (100 mg total) by mouth daily. 210 tablet 6  . traZODone (DESYREL) 100 MG tablet Take 1 tablet (100 mg total) by mouth at bedtime as needed for sleep. 30 tablet 2  . traZODone (DESYREL) 100 MG tablet Take 1 tablet (100 mg total) by mouth at bedtime. 30 tablet 3  . venlafaxine XR (EFFEXOR-XR) 150 MG 24 hr capsule Take 1 capsule (150 mg total) by mouth daily with breakfast. 30 capsule 3  . warfarin (COUMADIN) 4 MG tablet TAKE AS DIRECTED BY COUMADIN CLINIC (Patient taking differently: Take 2-4 mg by mouth See admin instructions. Take '4mg'$  by mouth on Monday, then take '2mg'$  (1/2 tablet) all other days) 65 tablet 1   No current facility-administered medications for this visit.     ASSESSMENT & PLAN:   Assessment:   1. Chronic ITP, which remains stable.  His platelet count fluctuates up and down, but usually runs in the 70,000 range.   2. Anemia, which is likely due to his chronic kidney disease.  3. Monoclonal gammopathy of uncertain significance, which has remained small and stable.  Serum protein electrophoresis and quantitative immunoglobulins are pending from today.  4. Immunosuppression for kidney and liver transplantation.  5. Chronic kidney disease.    6. Osteopenia, Reclast was  discontinued due to poor healing after dental work.  Plan: As he is following with multiple physicians routinely and his monoclonal gammopathy remains stable, we will plan to see him back in 1 year with CBC, comprehensive metabolic profile, serum protein electrophoresis, serum light chains and quantitative immunoglobulins.  I will call him with the results of his SPEP that we drew today.  If the M-spike has increased, I will need to see him back in 6 months.  The patient understands the plans discussed today and is in agreement with them.  He knows to contact our office if he develops concerns prior to his next appointment.   I provided 15 minutes of face-to-face time during this this encounter and > 50% was spent counseling as documented under my assessment and plan.    Derwood Kaplan, MD [facility]  [place] [address] Dept: [phone] Dept Fax: [phone]   I, Rita Ohara, am acting as scribe for Derwood Kaplan, MD  I have reviewed this report as typed by the medical scribe, and it is complete and accurate.

## 2022-10-04 ENCOUNTER — Other Ambulatory Visit (HOSPITAL_COMMUNITY): Payer: Self-pay | Admitting: Family Medicine

## 2022-10-04 DIAGNOSIS — I5022 Chronic systolic (congestive) heart failure: Secondary | ICD-10-CM

## 2022-10-23 DIAGNOSIS — I4891 Unspecified atrial fibrillation: Secondary | ICD-10-CM | POA: Diagnosis not present

## 2022-10-23 DIAGNOSIS — Z944 Liver transplant status: Secondary | ICD-10-CM

## 2022-10-23 DIAGNOSIS — D849 Immunodeficiency, unspecified: Secondary | ICD-10-CM

## 2022-10-23 DIAGNOSIS — F99 Mental disorder, not otherwise specified: Secondary | ICD-10-CM

## 2022-10-23 DIAGNOSIS — R0902 Hypoxemia: Secondary | ICD-10-CM | POA: Diagnosis not present

## 2022-10-23 DIAGNOSIS — Z940 Kidney transplant status: Secondary | ICD-10-CM

## 2022-10-23 DIAGNOSIS — I255 Ischemic cardiomyopathy: Secondary | ICD-10-CM | POA: Diagnosis not present

## 2022-10-23 DIAGNOSIS — R0600 Dyspnea, unspecified: Secondary | ICD-10-CM | POA: Diagnosis not present

## 2022-10-24 DIAGNOSIS — I255 Ischemic cardiomyopathy: Secondary | ICD-10-CM | POA: Diagnosis not present

## 2022-10-24 DIAGNOSIS — Z944 Liver transplant status: Secondary | ICD-10-CM

## 2022-10-24 DIAGNOSIS — R0902 Hypoxemia: Secondary | ICD-10-CM | POA: Diagnosis not present

## 2022-10-24 DIAGNOSIS — D849 Immunodeficiency, unspecified: Secondary | ICD-10-CM

## 2022-10-24 DIAGNOSIS — R0600 Dyspnea, unspecified: Secondary | ICD-10-CM | POA: Diagnosis not present

## 2022-10-24 DIAGNOSIS — F99 Mental disorder, not otherwise specified: Secondary | ICD-10-CM

## 2022-10-24 DIAGNOSIS — I4891 Unspecified atrial fibrillation: Secondary | ICD-10-CM | POA: Diagnosis not present

## 2022-10-24 DIAGNOSIS — Z940 Kidney transplant status: Secondary | ICD-10-CM

## 2022-10-25 DIAGNOSIS — I255 Ischemic cardiomyopathy: Secondary | ICD-10-CM | POA: Diagnosis not present

## 2022-10-25 DIAGNOSIS — R0902 Hypoxemia: Secondary | ICD-10-CM | POA: Diagnosis not present

## 2022-10-25 DIAGNOSIS — R0600 Dyspnea, unspecified: Secondary | ICD-10-CM | POA: Diagnosis not present

## 2022-10-25 DIAGNOSIS — I4891 Unspecified atrial fibrillation: Secondary | ICD-10-CM | POA: Diagnosis not present

## 2022-10-26 DIAGNOSIS — I4891 Unspecified atrial fibrillation: Secondary | ICD-10-CM | POA: Diagnosis not present

## 2022-10-26 DIAGNOSIS — R0600 Dyspnea, unspecified: Secondary | ICD-10-CM | POA: Diagnosis not present

## 2022-10-26 DIAGNOSIS — R0902 Hypoxemia: Secondary | ICD-10-CM | POA: Diagnosis not present

## 2022-10-26 DIAGNOSIS — I255 Ischemic cardiomyopathy: Secondary | ICD-10-CM | POA: Diagnosis not present

## 2022-10-27 DIAGNOSIS — R0600 Dyspnea, unspecified: Secondary | ICD-10-CM | POA: Diagnosis not present

## 2022-10-27 DIAGNOSIS — I255 Ischemic cardiomyopathy: Secondary | ICD-10-CM | POA: Diagnosis not present

## 2022-10-27 DIAGNOSIS — R0902 Hypoxemia: Secondary | ICD-10-CM | POA: Diagnosis not present

## 2022-10-27 DIAGNOSIS — I4891 Unspecified atrial fibrillation: Secondary | ICD-10-CM | POA: Diagnosis not present

## 2022-10-28 DIAGNOSIS — F99 Mental disorder, not otherwise specified: Secondary | ICD-10-CM

## 2022-10-28 DIAGNOSIS — I4891 Unspecified atrial fibrillation: Secondary | ICD-10-CM | POA: Diagnosis not present

## 2022-10-28 DIAGNOSIS — Z944 Liver transplant status: Secondary | ICD-10-CM | POA: Diagnosis not present

## 2022-10-28 DIAGNOSIS — Z940 Kidney transplant status: Secondary | ICD-10-CM | POA: Diagnosis not present

## 2022-10-28 DIAGNOSIS — I429 Cardiomyopathy, unspecified: Secondary | ICD-10-CM | POA: Diagnosis not present

## 2022-10-29 DIAGNOSIS — I429 Cardiomyopathy, unspecified: Secondary | ICD-10-CM | POA: Diagnosis not present

## 2022-10-29 DIAGNOSIS — Z940 Kidney transplant status: Secondary | ICD-10-CM | POA: Diagnosis not present

## 2022-10-29 DIAGNOSIS — Z944 Liver transplant status: Secondary | ICD-10-CM | POA: Diagnosis not present

## 2022-10-29 DIAGNOSIS — I4891 Unspecified atrial fibrillation: Secondary | ICD-10-CM | POA: Diagnosis not present

## 2023-04-09 DEATH — deceased
